# Patient Record
Sex: Female | Born: 1955
Health system: Southern US, Community
[De-identification: ages and names within clinical notes are randomized; demographics above are authoritative.]

## PROBLEM LIST (undated history)

## (undated) DIAGNOSIS — Z923 Personal history of irradiation: Secondary | ICD-10-CM

## (undated) DIAGNOSIS — Z9221 Personal history of antineoplastic chemotherapy: Secondary | ICD-10-CM

## (undated) DIAGNOSIS — J302 Other seasonal allergic rhinitis: Secondary | ICD-10-CM

## (undated) DIAGNOSIS — R011 Cardiac murmur, unspecified: Secondary | ICD-10-CM

## (undated) DIAGNOSIS — I10 Essential (primary) hypertension: Secondary | ICD-10-CM

## (undated) DIAGNOSIS — M1990 Unspecified osteoarthritis, unspecified site: Secondary | ICD-10-CM

## (undated) DIAGNOSIS — Z87442 Personal history of urinary calculi: Secondary | ICD-10-CM

## (undated) DIAGNOSIS — T7840XA Allergy, unspecified, initial encounter: Secondary | ICD-10-CM

## (undated) DIAGNOSIS — C50911 Malignant neoplasm of unspecified site of right female breast: Secondary | ICD-10-CM

## (undated) DIAGNOSIS — I340 Nonrheumatic mitral (valve) insufficiency: Secondary | ICD-10-CM

## (undated) DIAGNOSIS — Z87898 Personal history of other specified conditions: Secondary | ICD-10-CM

## (undated) DIAGNOSIS — I341 Nonrheumatic mitral (valve) prolapse: Secondary | ICD-10-CM

## (undated) DIAGNOSIS — C50411 Malignant neoplasm of upper-outer quadrant of right female breast: Secondary | ICD-10-CM

## (undated) DIAGNOSIS — R569 Unspecified convulsions: Secondary | ICD-10-CM

## (undated) HISTORY — DX: Nonrheumatic mitral (valve) prolapse: I34.1

## (undated) HISTORY — PX: MASTECTOMY: SHX3

## (undated) HISTORY — PX: TONSILLECTOMY: SHX5217

## (undated) HISTORY — DX: Essential (primary) hypertension: I10

## (undated) HISTORY — DX: Allergy, unspecified, initial encounter: T78.40XA

## (undated) HISTORY — DX: Unspecified convulsions: R56.9

## (undated) HISTORY — PX: PLANTAR'S WART EXCISION: SHX2240

## (undated) HISTORY — DX: Malignant neoplasm of upper-outer quadrant of right female breast: C50.411

---
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
## 1988-10-10 DIAGNOSIS — I341 Nonrheumatic mitral (valve) prolapse: Secondary | ICD-10-CM

## 1988-10-10 HISTORY — DX: Nonrheumatic mitral (valve) prolapse: I34.1

## 1994-10-10 HISTORY — PX: TUBAL LIGATION: SHX77

## 1996-10-10 HISTORY — PX: ABDOMINAL HYSTERECTOMY: SHX81

## 1998-02-13 ENCOUNTER — Ambulatory Visit (HOSPITAL_COMMUNITY): Admission: RE | Admit: 1998-02-13 | Discharge: 1998-02-13 | Payer: Self-pay | Admitting: Obstetrics and Gynecology

## 1998-02-17 ENCOUNTER — Emergency Department (HOSPITAL_COMMUNITY): Admission: EM | Admit: 1998-02-17 | Discharge: 1998-02-17 | Payer: Self-pay | Admitting: Emergency Medicine

## 1999-02-10 ENCOUNTER — Inpatient Hospital Stay (HOSPITAL_COMMUNITY): Admission: RE | Admit: 1999-02-10 | Discharge: 1999-02-13 | Payer: Self-pay | Admitting: Obstetrics and Gynecology

## 2002-01-10 ENCOUNTER — Encounter: Admission: RE | Admit: 2002-01-10 | Discharge: 2002-01-10 | Payer: Self-pay | Admitting: *Deleted

## 2002-01-10 ENCOUNTER — Encounter: Payer: Self-pay | Admitting: *Deleted

## 2002-03-02 ENCOUNTER — Emergency Department (HOSPITAL_COMMUNITY): Admission: EM | Admit: 2002-03-02 | Discharge: 2002-03-02 | Payer: Self-pay | Admitting: Emergency Medicine

## 2002-03-03 ENCOUNTER — Encounter: Payer: Self-pay | Admitting: Emergency Medicine

## 2002-10-02 ENCOUNTER — Other Ambulatory Visit: Admission: RE | Admit: 2002-10-02 | Discharge: 2002-10-02 | Payer: Self-pay | Admitting: Obstetrics and Gynecology

## 2003-01-19 ENCOUNTER — Emergency Department (HOSPITAL_COMMUNITY): Admission: EM | Admit: 2003-01-19 | Discharge: 2003-01-19 | Payer: Self-pay | Admitting: Emergency Medicine

## 2004-12-16 ENCOUNTER — Other Ambulatory Visit: Admission: RE | Admit: 2004-12-16 | Discharge: 2004-12-16 | Payer: Self-pay | Admitting: Obstetrics and Gynecology

## 2007-12-21 ENCOUNTER — Encounter: Admission: RE | Admit: 2007-12-21 | Discharge: 2007-12-21 | Payer: Self-pay | Admitting: Orthopedic Surgery

## 2008-02-26 ENCOUNTER — Ambulatory Visit: Payer: Self-pay | Admitting: Surgery

## 2008-02-26 ENCOUNTER — Encounter (INDEPENDENT_AMBULATORY_CARE_PROVIDER_SITE_OTHER): Payer: Self-pay | Admitting: Orthopedic Surgery

## 2008-02-26 ENCOUNTER — Ambulatory Visit (HOSPITAL_COMMUNITY): Admission: RE | Admit: 2008-02-26 | Discharge: 2008-02-26 | Payer: Self-pay | Admitting: Orthopedic Surgery

## 2008-03-26 ENCOUNTER — Ambulatory Visit (HOSPITAL_COMMUNITY): Admission: RE | Admit: 2008-03-26 | Discharge: 2008-03-26 | Payer: Self-pay | Admitting: Orthopedic Surgery

## 2008-03-26 HISTORY — PX: KNEE ARTHROSCOPY: SHX127

## 2010-02-21 ENCOUNTER — Emergency Department (HOSPITAL_COMMUNITY): Admission: RE | Admit: 2010-02-21 | Discharge: 2010-02-21 | Payer: Self-pay | Admitting: Family Medicine

## 2010-04-09 LAB — HM PAP SMEAR: HM Pap smear: NORMAL

## 2010-04-09 LAB — HM MAMMOGRAPHY: HM Mammogram: NORMAL

## 2010-12-27 LAB — URINE MICROSCOPIC-ADD ON

## 2010-12-27 LAB — URINALYSIS, ROUTINE W REFLEX MICROSCOPIC
Bilirubin Urine: NEGATIVE
Glucose, UA: NEGATIVE mg/dL
Ketones, ur: NEGATIVE mg/dL
Leukocytes, UA: NEGATIVE
Nitrite: NEGATIVE
Protein, ur: NEGATIVE mg/dL
Specific Gravity, Urine: 1.017 (ref 1.005–1.030)
Urobilinogen, UA: 0.2 mg/dL (ref 0.0–1.0)
pH: 6.5 (ref 5.0–8.0)

## 2011-02-22 NOTE — Op Note (Signed)
NAME:  Katie Jacobson, Katie Jacobson              ACCOUNT NO.:  [id number]   MEDICAL RECORD NO.:  [id number]          PATIENT TYPE:  AMB   LOCATION:  SDS                          FACILITY:  [facility]   PHYSICIAN:  Myrtie Neither, MD      DATE OF BIRTH:  January 09, 1956   DATE OF PROCEDURE:  DATE OF DISCHARGE:  03/26/2008                               OPERATIVE REPORT   PREOP DIAGNOSES:  Lateral meniscal tear, internal derangement of the  left knee.   POSTOP DIAGNOSES:  Lateral meniscal tear, patellar plica, and chondral  defect involving the tibial plateau surface, and mild defect of the  lateral femoral condyle.   ANESTHESIA:  General.   PROCEDURES:  1. Arthroscopic synovectomy and excision of plica.  2. Lateral meniscectomy.  3. Chondroplasty of the lateral tibial plateau surface.   The patient was taken to the operating room.  After given adequate preop  medications, given general anesthesia, and intubated, left knee was  prepped with DuraPrep and draped in sterile manner.  Tourniquet used for  hemostasis.  A 1/2 inch puncture wound made along the anteromedial and  anterolateral joint line, medial suprapatellar pouch, and puncture wound  was made for inflow of water.  Inspection of the joint revealed a  macerated lateral meniscal tear, which had damaged the articular surface  of the tibial plateau surface and a small defect of the lateral femoral  condyle.  Large patellar plica was also present.  Medial compartment was  well-preserved.  ACL was intact.  There was arthritic chondromalacic  changes of the patella and patellofemoral joint.  With the synovial  shaver, synovectomy and excision of the patella plica was done.  Followed by use of basket forceps, the lateral meniscectomy, as well as  the meniscal shaver.  Chondroplasty of the tibial surface was done with  the meniscal shaver followed by use of a rasp.  Copious antibiotic  irrigation was done.  Wound closure was then done with 4-0 nylon  followed by 10 mL of 0.25% Marcaine plain injected into the joint.  Compressive dressing was applied.  The patient tolerated procedure quite  well and went to recovery room in stable and satisfactory condition.  The patient being discharged home, partial weightbearing with use of  crutches, ice packs, elevation, Percocet 1-2 q.4 p.r.n. for pain and to  return to the office in 1 week.  The patient has been discharged in  stable and satisfactory condition.      Myrtie Neither, MD  Electronically Signed     AC/MEDQ  D:  03/26/2008  T:  03/26/2008  Job:  [id number]

## 2011-04-01 ENCOUNTER — Encounter: Payer: Self-pay | Admitting: Family

## 2011-04-01 ENCOUNTER — Ambulatory Visit (INDEPENDENT_AMBULATORY_CARE_PROVIDER_SITE_OTHER): Payer: BC Managed Care – PPO | Admitting: Family

## 2011-04-01 DIAGNOSIS — D219 Benign neoplasm of connective and other soft tissue, unspecified: Secondary | ICD-10-CM | POA: Insufficient documentation

## 2011-04-01 DIAGNOSIS — J45909 Unspecified asthma, uncomplicated: Secondary | ICD-10-CM

## 2011-04-01 DIAGNOSIS — I341 Nonrheumatic mitral (valve) prolapse: Secondary | ICD-10-CM | POA: Insufficient documentation

## 2011-04-01 DIAGNOSIS — Z720 Tobacco use: Secondary | ICD-10-CM | POA: Insufficient documentation

## 2011-04-01 DIAGNOSIS — N951 Menopausal and female climacteric states: Secondary | ICD-10-CM

## 2011-04-01 DIAGNOSIS — I10 Essential (primary) hypertension: Secondary | ICD-10-CM

## 2011-04-01 DIAGNOSIS — I080 Rheumatic disorders of both mitral and aortic valves: Secondary | ICD-10-CM | POA: Insufficient documentation

## 2011-04-01 DIAGNOSIS — I059 Rheumatic mitral valve disease, unspecified: Secondary | ICD-10-CM

## 2011-04-01 DIAGNOSIS — R232 Flushing: Secondary | ICD-10-CM | POA: Insufficient documentation

## 2011-04-01 DIAGNOSIS — D259 Leiomyoma of uterus, unspecified: Secondary | ICD-10-CM

## 2011-04-01 DIAGNOSIS — Z8669 Personal history of other diseases of the nervous system and sense organs: Secondary | ICD-10-CM

## 2011-04-01 DIAGNOSIS — F17200 Nicotine dependence, unspecified, uncomplicated: Secondary | ICD-10-CM

## 2011-04-01 MED ORDER — FLUCONAZOLE 150 MG PO TABS: ORAL_TABLET | ORAL | Status: DC

## 2011-04-01 MED ORDER — ALBUTEROL SULFATE HFA 108 (90 BASE) MCG/ACT IN AERS: 2.0000 | INHALATION_SPRAY | Freq: Four times a day (QID) | RESPIRATORY_TRACT | Status: DC | PRN

## 2011-04-01 NOTE — Assessment & Plan Note (Signed)
Clinically stable, she is requesting refill on her albuterol MDI which is expired.

## 2011-04-01 NOTE — Assessment & Plan Note (Signed)
Discussed tobacco cessation with the patient x 10 minutes today. Recommended that she set a quit date.

## 2011-04-01 NOTE — Progress Notes (Signed)
  Subjective:    Patient ID: Katie Jacobson, female    DOB: Feb 03, 1956, 55 y.o.   MRN: [id number]  HPI  Katie Jacobson is a 55 year old female who presents today to establish care.  She was previously followed by Katie Jacobson at [facility] in [place].    She has chief complaint today of hot flashes- she reports that the flashes started started several years ago, but notes that in the last 1 month it is occuring almost every 15 minutes.  She reports that she had a partial hysterectomy.    GYN- Katie Jacobson.  Had a first cousin with breast cancer- died in her late 52's.    Tobacco-  She has tried nicorette,    Review of Systems  Constitutional: Negative for fever and unexpected weight change.  HENT: Negative for hearing loss.   Eyes: Negative for visual disturbance.  Respiratory: Negative for shortness of breath.   Cardiovascular: Negative for chest pain and leg swelling.  Gastrointestinal: Negative for nausea.  Genitourinary: Positive for vaginal discharge. Negative for dysuria and frequency.       Objective:   Physical Exam  Constitutional: She appears well-developed and well-nourished.       Morbidly obese AA female.  Neck: Neck supple.       Scar noted on the right side of her neck  Cardiovascular: Normal rate and regular rhythm.   Pulmonary/Chest: Effort normal and breath sounds normal.  Lymphadenopathy:    She has no cervical adenopathy.  Psychiatric: She has a normal mood and affect. Her behavior is normal. Judgment and thought content normal.          Assessment & Plan:

## 2011-04-01 NOTE — Assessment & Plan Note (Signed)
I recommended a trial of citalopram to help with hot flashes, she declines at this time. She has an apt with her GYN and I have advised her to discuss if she would be a good candidate for HRT with him.

## 2011-04-01 NOTE — Patient Instructions (Signed)
Work hard on quitting smoking. Follow up in 1 month for a complete physical- come fasting to this appointment. Welcome to [facility]!

## 2011-04-01 NOTE — Assessment & Plan Note (Signed)
BP is stable today, continue current meds.

## 2011-04-08 ENCOUNTER — Telehealth: Payer: Self-pay | Admitting: *Deleted

## 2011-04-08 NOTE — Telephone Encounter (Signed)
Records received from Dr Chestine Spore at [facility] and forwarded to Provider for signature.

## 2011-04-28 ENCOUNTER — Encounter: Payer: Self-pay | Admitting: Family

## 2011-05-06 ENCOUNTER — Ambulatory Visit: Payer: BC Managed Care – PPO | Admitting: Family

## 2011-07-06 ENCOUNTER — Encounter: Payer: Self-pay | Admitting: Family

## 2011-07-06 ENCOUNTER — Ambulatory Visit (INDEPENDENT_AMBULATORY_CARE_PROVIDER_SITE_OTHER): Payer: BC Managed Care – PPO | Admitting: Family

## 2011-07-06 DIAGNOSIS — Z0000 Encounter for general adult medical examination without abnormal findings: Secondary | ICD-10-CM

## 2011-07-06 DIAGNOSIS — Z23 Encounter for immunization: Secondary | ICD-10-CM

## 2011-07-06 LAB — CBC WITH DIFFERENTIAL/PLATELET
Basophils Absolute: 0 10*3/uL (ref 0.0–0.1)
Eosinophils Absolute: 0.1 10*3/uL (ref 0.0–0.7)
Eosinophils Relative: 2 % (ref 0–5)
HCT: 44.7 % (ref 36.0–46.0)
Lymphocytes Relative: 35 % (ref 12–46)
Lymphs Abs: 2.2 10*3/uL (ref 0.7–4.0)
MCH: 30.4 pg (ref 26.0–34.0)
MCV: 93.1 fL (ref 78.0–100.0)
Monocytes Absolute: 0.5 10*3/uL (ref 0.1–1.0)
Platelets: 227 10*3/uL (ref 150–400)
RDW: 15.9 % — ABNORMAL HIGH (ref 11.5–15.5)

## 2011-07-06 LAB — HEPATIC FUNCTION PANEL
Albumin: 4.1 g/dL (ref 3.5–5.2)
Alkaline Phosphatase: 70 U/L (ref 39–117)
Total Bilirubin: 0.3 mg/dL (ref 0.3–1.2)
Total Protein: 6.6 g/dL (ref 6.0–8.3)

## 2011-07-06 LAB — LIPID PANEL
HDL: 66 mg/dL (ref >39)
LDL Cholesterol: 125 mg/dL — ABNORMAL HIGH (ref 0–99)
Triglycerides: 65 mg/dL (ref <150)
VLDL: 13 mg/dL (ref 0–40)

## 2011-07-06 LAB — BASIC METABOLIC PANEL WITH GFR
CO2: 24 mEq/L (ref 19–32)
Calcium: 9.1 mg/dL (ref 8.4–10.5)
Chloride: 106 mEq/L (ref 96–112)
Creat: 0.87 mg/dL (ref 0.50–1.10)
Glucose, Bld: 77 mg/dL (ref 70–99)

## 2011-07-06 NOTE — Progress Notes (Signed)
Subjective:    Patient ID: Katie Jacobson, female    DOB: June 11, 1956, 55 y.o.   MRN: [id number]  HPI  Katie Jacobson is a 55 yr old female who presents today for a complete physical.  Last tetanus >10 yrs ago.  Not exercising.  Wants to start running. Diet is - fair eats out a lot.  Pap and mammogram are up to date- sees [facility].  Tobacco abuse- Still smoking.    Review of Systems  Constitutional: Negative for fever and unexpected weight change.  HENT: Positive for congestion.   Respiratory: Negative for cough.   Cardiovascular: Negative for chest pain.  Gastrointestinal: Negative for vomiting and diarrhea.  Genitourinary: Negative for urgency.  Musculoskeletal: Negative for back pain.  Skin: Negative for rash.  Neurological: Negative for dizziness.  Hematological: Negative for adenopathy.  Psychiatric/Behavioral:       Denies depression/anxiety   Past Medical History  Diagnosis Date  . Hypertension   . Osteoporosis   . HTN (hypertension) 04/01/2011  . History of partial seizures 04/01/2011  . Mitral valve prolapse     History   Social History  . Marital Status: Married    Spouse Name: N/A    Number of Children: N/A  . Years of Education: N/A   Occupational History  . Not on file.   Social History Main Topics  . Smoking status: Current Everyday Smoker -- 1.0 packs/day for 39 years    Types: Cigarettes, Cigars  . Smokeless tobacco: Never Used  . Alcohol Use: 4.2 oz/week    7 Cans of beer per week  . Drug Use: No  . Sexually Active: Not on file   Other Topics Concern  . 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 Appendectomy 1976  . Abdominal hysterectomy 1998    ovaries spared  . Tonsillectomy 1970s  . Knee arthroscopy 2006    left  knee  . Cystectomy     Pt reports removal of a  "stone" from the right side of her throat as a child  . Tubal ligation 1996    Family History  Problem Relation Age of Onset  . Heart disease Mother   . Stroke Mother   . Hypertension Mother   . Diabetes Mother   . Diabetes Sister   . Kidney disease Brother   . Mental retardation Brother   . Cancer Cousin     breast  . Diabetes Sister     Allergies  Allergen Reactions  . Sulfa Antibiotics     Unknown reaction    Current Outpatient Prescriptions on File Prior to Visit  Medication Sig Dispense Refill  . albuterol (PROVENTIL HFA;VENTOLIN HFA) 108 (90 BASE) MCG/ACT inhaler Inhale 2 puffs into the lungs every 6 (six) hours as needed for wheezing.  1 Inhaler  2  . enalapril (VASOTEC) 20 MG tablet Take 20 mg by mouth daily.        . metoprolol (TOPROL-XL) 50 MG 24 hr tablet Take 50 mg by mouth daily.          BP 118/88  Pulse 72  Temp(Src) 97.8 F (36.6 C) (Oral)  Resp 16  Ht 5\' 1"  (1.549 m)  Wt 227 lb 0.6 oz (102.985 kg)  BMI 42.90 kg/m2  LMP 10/10/1996  Objective:   Physical Exam  Constitutional: She is oriented to person, place, and time. She appears well-developed and well-nourished. No distress.  HENT:  Head: Normocephalic and atraumatic.  Right Ear: Tympanic membrane and ear canal normal.  Left Ear: Tympanic membrane and ear canal normal.  Mouth/Throat: Uvula is midline, oropharynx is clear and moist and mucous membranes are normal.  Eyes: Conjunctivae are normal. Pupils are equal, round, and reactive to light.  Neck: Normal range of motion. Neck supple. No thyromegaly present.  Cardiovascular: Normal rate and regular rhythm.   No murmur heard. Pulmonary/Chest: Effort normal and breath sounds normal. No respiratory distress. She has no wheezes. She has no rales. She exhibits no tenderness.  Abdominal: Soft. Bowel sounds are normal. She exhibits no distension and no mass. There is no tenderness. There is no rebound and no guarding.  Musculoskeletal: Normal  range of motion. She exhibits no edema.  Lymphadenopathy:    She has no cervical adenopathy.  Neurological: She is alert and oriented to person, place, and time. She displays normal reflexes. No cranial nerve deficit. She exhibits normal muscle tone. Coordination normal.  Skin: Skin is warm and dry. No rash noted. No erythema. No pallor.  Psychiatric: She has a normal mood and affect. Her behavior is normal. Judgment and thought content normal.          Assessment & Plan:

## 2011-07-06 NOTE — Patient Instructions (Addendum)
You will be contacted about your referral for colonoscopy. Please complete lab work on the first floor.  Follow up in 6 months.

## 2011-07-07 ENCOUNTER — Encounter: Payer: Self-pay | Admitting: Family

## 2011-07-07 ENCOUNTER — Telehealth: Payer: Self-pay | Admitting: *Deleted

## 2011-07-07 DIAGNOSIS — Z0000 Encounter for general adult medical examination without abnormal findings: Secondary | ICD-10-CM | POA: Insufficient documentation

## 2011-07-07 DIAGNOSIS — E785 Hyperlipidemia, unspecified: Secondary | ICD-10-CM | POA: Insufficient documentation

## 2011-07-07 LAB — COMPREHENSIVE METABOLIC PANEL
ALT: 16
Alkaline Phosphatase: 80
BUN: 7
CO2: 27
Chloride: 107
GFR calc non Af Amer: 55 — ABNORMAL LOW
Glucose, Bld: 90
Potassium: 4.9
Sodium: 141
Total Bilirubin: 0.7

## 2011-07-07 LAB — CBC
HCT: 40.6
Hemoglobin: 13.7
RBC: 4.46

## 2011-07-07 LAB — URINALYSIS, ROUTINE W REFLEX MICROSCOPIC
Glucose, UA: NEGATIVE
Ketones, ur: NEGATIVE
Nitrite: NEGATIVE
Protein, ur: NEGATIVE
Urobilinogen, UA: 0.2

## 2011-07-07 NOTE — Telephone Encounter (Signed)
Received call from pt stating her employer gave her a form to be completed for her health screening yesterday. Attempted to reach pt and left message that she can bring form to the office or fax it to [place] and left fax # on voicemail.

## 2011-07-07 NOTE — Assessment & Plan Note (Signed)
Patient was counseled on diet, exercise and weight loss.  Flu shot/tetanus given today. Will refer for colo. Pt was counseled on smoking cessation.

## 2011-07-07 NOTE — Telephone Encounter (Signed)
Pt returned my call stating she will fax the form now. States they are requesting body measurements and advises [place] to complete and submit the info that we have as she is aware that we did not obtain this at last OV. Form must be faxed to [employer] by 07/25/11. Pt also requests that we mail her a copy of completed form and lab results once they are available.

## 2011-07-08 ENCOUNTER — Encounter: Payer: Self-pay | Admitting: Gastroenterology

## 2011-07-08 NOTE — Telephone Encounter (Signed)
Form has not been received. Notified pt and she states she has tried numerous times without success to complete the transmission. Pt states she will drop form off by the office.

## 2011-07-11 NOTE — Telephone Encounter (Signed)
Form received. Form is requiring pt signature to authorize release of medical info to insurance company. Left message at pt's work number [phone]) to call me with her fax number so she can sign form prior to completion and release of info.

## 2011-07-13 NOTE — Telephone Encounter (Signed)
Pt refaxed signed copy of form. Form completed, signed and faxed to [facility] @ [phone] along with lab results. Copy of form mailed to pt.

## 2011-07-22 ENCOUNTER — Ambulatory Visit (AMBULATORY_SURGERY_CENTER): Payer: Managed Care, Other (non HMO) | Admitting: *Deleted

## 2011-07-22 VITALS — Ht 61.0 in | Wt 227.4 lb

## 2011-07-22 DIAGNOSIS — Z1211 Encounter for screening for malignant neoplasm of colon: Secondary | ICD-10-CM

## 2011-07-22 MED ORDER — PEG-KCL-NACL-NASULF-NA ASC-C 100 G PO SOLR: ORAL | Status: DC

## 2011-08-05 ENCOUNTER — Ambulatory Visit (AMBULATORY_SURGERY_CENTER): Payer: Managed Care, Other (non HMO) | Admitting: Gastroenterology

## 2011-08-05 ENCOUNTER — Encounter: Payer: Self-pay | Admitting: Gastroenterology

## 2011-08-05 VITALS — BP 140/89 | HR 66 | Temp 96.7°F | Resp 22 | Ht 61.0 in | Wt 227.0 lb

## 2011-08-05 DIAGNOSIS — D126 Benign neoplasm of colon, unspecified: Secondary | ICD-10-CM

## 2011-08-05 DIAGNOSIS — K5730 Diverticulosis of large intestine without perforation or abscess without bleeding: Secondary | ICD-10-CM

## 2011-08-05 DIAGNOSIS — Z1211 Encounter for screening for malignant neoplasm of colon: Secondary | ICD-10-CM

## 2011-08-05 MED ORDER — SODIUM CHLORIDE 0.9 % IV SOLN: 500.0000 mL | INTRAVENOUS | Status: DC

## 2011-08-05 NOTE — Patient Instructions (Addendum)
Colon Polyps A polyp is extra tissue that grows inside your body. Colon polyps grow in the large intestine. The large intestine, also called the colon, is part of your digestive system. It is a long, hollow tube at the end of your digestive tract where your body makes and stores stool. Most polyps are not dangerous. They are benign. This means they are not cancerous. But over time, some types of polyps can turn into cancer. Polyps that are smaller than a pea are usually not harmful. But larger polyps could someday become or may already be cancerous. To be safe, doctors remove all polyps and test them.  WHO GETS POLYPS? Anyone can get polyps, but certain people are more likely than others. You may have a greater chance of getting polyps if:  You are over 50.   You have had polyps before.   Someone in your family has had polyps.   Someone in your family has had cancer of the large intestine.   Find out if someone in your family has had polyps. You may also be more likely to get polyps if you:   Eat a lot of fatty foods.   Smoke.   Drink alcohol.   Do not exercise.   Eat too much.  SYMPTOMS  Most small polyps do not cause symptoms. People often do not know they have one until their caregiver finds it during a regular checkup or while testing them for something else. Some people do have symptoms like these:  Bleeding from the anus. You might notice blood on your underwear or on toilet paper after you have had a bowel movement.   Constipation or diarrhea that lasts more than a week.   Blood in the stool. Blood can make stool look black or it can show up as red streaks in the stool.  If you have any of these symptoms, see your caregiver. HOW DOES THE DOCTOR TEST FOR POLYPS? The doctor can use four tests to check for polyps:  Digital rectal exam. The caregiver wears gloves and checks your rectum (the last part of the large intestine) to see if it feels normal. This test would find  polyps only in the rectum. Your caregiver may need to do one of the other tests listed below to find polyps higher up in the intestine.   Barium enema. The caregiver puts a liquid called barium into your rectum before taking x-rays of your large intestine. Barium makes your intestine look white in the pictures. Polyps are dark, so they are easy to see.   Sigmoidoscopy. With this test, the caregiver can see inside your large intestine. A thin flexible tube is placed into your rectum. The device is called a sigmoidoscope, which has a light and a tiny video camera in it. The caregiver uses the sigmoidoscope to look at the last third of your large intestine.   Colonoscopy. This test is like sigmoidoscopy, but the caregiver looks at all of the large intestine. It usually requires sedation. This is the most common method for finding and removing polyps.  TREATMENT   The caregiver will remove the polyp during sigmoidoscopy or colonoscopy. The polyp is then tested for cancer.   If you have had polyps, your caregiver may want you to get tested regularly in the future.  PREVENTION  There is not one sure way to prevent polyps. You might be able to lower your risk of getting them if you:  Eat more fruits and vegetables and less fatty  food.   Do not smoke.   Avoid alcohol.   Exercise every day.   Lose weight if you are overweight.   Eating more calcium and folate can also lower your risk of getting polyps. Some foods that are rich in calcium are milk, cheese, and broccoli. Some foods that are rich in folate are chickpeas, kidney beans, and spinach.   Aspirin might help prevent polyps. Studies are under way.  Document Released: 06/22/2004 Document Revised: 06/08/2011 Document Reviewed: 11/28/2007 [facility] Patient Information 2012 [place], [place]. Handouts on polyps and diverticulosis given. Resume previous medications.

## 2011-08-05 NOTE — Progress Notes (Signed)
The pt tolerated the colonoscopy very well. maw 

## 2011-08-08 ENCOUNTER — Telehealth: Payer: Self-pay

## 2011-08-08 NOTE — Telephone Encounter (Signed)
I called the home and cell number which neither had a ID.  No message was left. maw

## 2011-08-12 ENCOUNTER — Telehealth: Payer: Self-pay | Admitting: Gastroenterology

## 2011-08-12 NOTE — Telephone Encounter (Signed)
Left message for pt to call back.  Pt had questions about her pathology report and results letter. Discussed letter with pt and all questions were answered.

## 2011-12-30 ENCOUNTER — Ambulatory Visit: Payer: BC Managed Care – PPO | Admitting: Family

## 2011-12-30 DIAGNOSIS — Z0289 Encounter for other administrative examinations: Secondary | ICD-10-CM

## 2012-07-06 ENCOUNTER — Encounter: Payer: Self-pay | Admitting: Family

## 2012-07-06 ENCOUNTER — Ambulatory Visit (INDEPENDENT_AMBULATORY_CARE_PROVIDER_SITE_OTHER): Payer: BC Managed Care – PPO | Admitting: Family

## 2012-07-06 VITALS — BP 140/86 | HR 71 | Temp 98.6°F | Resp 16 | Wt 231.0 lb

## 2012-07-06 DIAGNOSIS — I10 Essential (primary) hypertension: Secondary | ICD-10-CM

## 2012-07-06 DIAGNOSIS — Z0000 Encounter for general adult medical examination without abnormal findings: Secondary | ICD-10-CM

## 2012-07-06 DIAGNOSIS — Z720 Tobacco use: Secondary | ICD-10-CM

## 2012-07-06 DIAGNOSIS — Z23 Encounter for immunization: Secondary | ICD-10-CM

## 2012-07-06 DIAGNOSIS — F17200 Nicotine dependence, unspecified, uncomplicated: Secondary | ICD-10-CM

## 2012-07-06 LAB — BASIC METABOLIC PANEL WITH GFR
CO2: 28 mEq/L (ref 19–32)
Calcium: 9.3 mg/dL (ref 8.4–10.5)
Creat: 1.03 mg/dL (ref 0.50–1.10)
GFR, Est African American: 72 mL/min
Sodium: 143 mEq/L (ref 135–145)

## 2012-07-06 LAB — HEPATIC FUNCTION PANEL
Albumin: 4.1 g/dL (ref 3.5–5.2)
Total Bilirubin: 0.4 mg/dL (ref 0.3–1.2)
Total Protein: 6.8 g/dL (ref 6.0–8.3)

## 2012-07-06 LAB — CBC WITH DIFFERENTIAL/PLATELET
Basophils Absolute: 0 10*3/uL (ref 0.0–0.1)
Eosinophils Absolute: 0.2 10*3/uL (ref 0.0–0.7)
Eosinophils Relative: 3 % (ref 0–5)
Lymphocytes Relative: 39 % (ref 12–46)
MCH: 30.7 pg (ref 26.0–34.0)
MCV: 90.7 fL (ref 78.0–100.0)
Neutrophils Relative %: 49 % (ref 43–77)
Platelets: 220 10*3/uL (ref 150–400)
RDW: 15.3 % (ref 11.5–15.5)
WBC: 6 10*3/uL (ref 4.0–10.5)

## 2012-07-06 LAB — LIPID PANEL
HDL: 66 mg/dL (ref >39)
Triglycerides: 58 mg/dL (ref <150)

## 2012-07-06 MED ORDER — ALBUTEROL SULFATE HFA 108 (90 BASE) MCG/ACT IN AERS: 2.0000 | INHALATION_SPRAY | Freq: Four times a day (QID) | RESPIRATORY_TRACT | Status: DC | PRN

## 2012-07-06 MED ORDER — VARENICLINE TARTRATE 0.5 MG X 11 & 1 MG X 42 PO MISC: ORAL | Status: DC

## 2012-07-06 NOTE — Progress Notes (Signed)
Subjective:    Patient ID: Katie Jacobson, female    DOB: Nov 08, 1955, 56 y.o.   MRN: [id number]  HPI  Patient presents today for complete physical.  Immunizations: up to date on tetanus, would like flu shot. Diet: Up about 4 pounds since last visit. Not healthy. Eating late at night.  Exercise: Not exercising.  Plans to start Colonoscopy: had colo last year + polyps.  Dr. Arlyce Dice would like 5 year follow up colo.  Dexa: 04/01/11 Pap Smear: follows with GYN (dr. Tenny Craw) Last pap 07/06/11. Mammogram: 07/06/11 Tobacco abuse- Trying to cut back.  Currently smoking just less than a pack.     Review of Systems  Constitutional: Negative for unexpected weight change.  HENT: Negative for hearing loss.   Eyes: Negative for visual disturbance.  Respiratory: Negative for chest tightness and shortness of breath.   Cardiovascular: Negative for chest pain.  Gastrointestinal: Negative for nausea, vomiting, diarrhea and constipation.  Genitourinary: Negative for dysuria and frequency.  Musculoskeletal: Negative for myalgias and arthralgias.  Skin: Negative for rash.  Neurological: Negative for headaches.  Hematological: Does not bruise/bleed easily.  Psychiatric/Behavioral:       Denies depression/anxiety   Past Medical History  Diagnosis Date  . Hypertension   . Osteoporosis   . HTN (hypertension) 04/01/2011  . History of partial seizures 04/01/2011  . Mitral valve prolapse     History   Social History  . Marital Status: Married    Spouse Name: N/A    Number of Children: N/A  . Years of Education: N/A   Occupational History  . Not on file.   Social History Main Topics  . Smoking status: Current Every Day Smoker -- 1.0 packs/day for 39 years    Types: Cigarettes  . Smokeless tobacco: Never Used  . Alcohol Use: 4.2 oz/week    7 Cans of beer per week  . Drug Use: No  . Sexually Active: Not on file   Other Topics Concern  . 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 Appendectomy 1976  . Abdominal hysterectomy 1998    ovaries spared  . Tonsillectomy 1970s  . Knee arthroscopy 2006    left  knee  . Cystectomy     Pt reports removal of a "stone" from the right side of her throat as a child  . Tubal ligation 1996  . Foot sx     X2    Family History  Problem Relation Age of Onset  . Heart disease Mother   . Stroke Mother   . Hypertension Mother   . Diabetes Mother   . Diabetes Sister   . Kidney disease Brother   . Mental retardation Brother   . Cancer Cousin     breast  . Diabetes Sister   . Colon cancer Neg Hx   . Esophageal cancer Neg Hx   . Stomach cancer Neg Hx     Allergies  Allergen Reactions  . Sulfa Antibiotics Other (See Comments)    Unknown reaction    Current Outpatient Prescriptions on File Prior to Visit  Medication Sig Dispense Refill  . enalapril (VASOTEC) 20 MG tablet Take 20 mg by mouth daily.        . metoprolol (TOPROL-XL) 50 MG 24 hr tablet Take 50 mg by mouth daily.        Marland Kitchen  DISCONTD: albuterol (PROVENTIL HFA;VENTOLIN HFA) 108 (90 BASE) MCG/ACT inhaler Inhale 2 puffs into the lungs every 6 (six) hours as needed for wheezing.  1 Inhaler  2    BP 140/86  Pulse 71  Temp 98.6 F (37 C) (Oral)  Resp 16  Wt 231 lb (104.781 kg)  SpO2 99%  LMP 10/10/1996       Objective:   Physical Exam  Physical Exam  Constitutional: She is oriented to person, place, and time. She appears well-developed and well-nourished. No distress.  HENT:  Head: Normocephalic and atraumatic.  Right Ear: Tympanic membrane and ear canal normal.  Left Ear: Tympanic membrane and ear canal normal.  Mouth/Throat: Oropharynx is clear and moist.  Eyes: Pupils are equal, round, and reactive to light. No scleral icterus.  Neck: Normal range of motion. No thyromegaly present.    Cardiovascular: Normal rate and regular rhythm.   No murmur heard. Pulmonary/Chest: Effort normal and breath sounds normal. No respiratory distress. He has no wheezes. She has no rales. She exhibits no tenderness.  Abdominal: Soft. Bowel sounds are normal. He exhibits no distension and no mass. There is no tenderness. There is no rebound and no guarding.  Musculoskeletal: She exhibits no edema.  Lymphadenopathy:    She has no cervical adenopathy.  Neurological: She is alert and oriented to person, place, and time. She has normal reflexes. She exhibits normal muscle tone. Coordination normal.  Skin: Skin is warm and dry.  Psychiatric: She has a normal mood and affect. Her behavior is normal. Judgment and thought content normal.  Breast/pelvic- deferred to GYN.        Assessment & Plan:        Assessment & Plan:

## 2012-07-06 NOTE — Assessment & Plan Note (Signed)
Fair BP control. This is managed by cardiology Dr. Julieanne Manson.

## 2012-07-06 NOTE — Assessment & Plan Note (Addendum)
Obtain fasting labs. Flu shot today. Pneumovax today.  Pap- she plans to follow up with GYN.  Counseled pt on diet, exercise/weight loss.

## 2012-07-06 NOTE — Assessment & Plan Note (Signed)
Pt counseled on tobacco cessation x 5 minutes.  She wishes to try chantix.  We discussed common side effects including rare side effect of suicide ideation.  She is to call us for refill before she finishes starter pack.

## 2012-07-06 NOTE — Patient Instructions (Addendum)
Please complete your blood work prior to leaving.  Please follow up in 3 months. Good luck quitting smoking.

## 2012-07-07 LAB — URINALYSIS, ROUTINE W REFLEX MICROSCOPIC
Bilirubin Urine: NEGATIVE
Glucose, UA: NEGATIVE mg/dL
Ketones, ur: NEGATIVE mg/dL
Leukocytes, UA: NEGATIVE
pH: 5.5 (ref 5.0–8.0)

## 2012-07-07 LAB — URINALYSIS, MICROSCOPIC ONLY
Bacteria, UA: NONE SEEN
Casts: NONE SEEN

## 2012-07-09 ENCOUNTER — Encounter: Payer: Self-pay | Admitting: Family

## 2012-08-04 ENCOUNTER — Emergency Department (HOSPITAL_BASED_OUTPATIENT_CLINIC_OR_DEPARTMENT_OTHER)
Admission: EM | Admit: 2012-08-04 | Discharge: 2012-08-04 | Disposition: A | Discharge status: Home / Self Care | Payer: BC Managed Care – PPO | Attending: Emergency Medicine | Admitting: Emergency Medicine

## 2012-08-04 ENCOUNTER — Encounter (HOSPITAL_BASED_OUTPATIENT_CLINIC_OR_DEPARTMENT_OTHER): Payer: Self-pay | Admitting: *Deleted

## 2012-08-04 ENCOUNTER — Emergency Department (HOSPITAL_BASED_OUTPATIENT_CLINIC_OR_DEPARTMENT_OTHER): Payer: BC Managed Care – PPO

## 2012-08-04 DIAGNOSIS — N200 Calculus of kidney: Secondary | ICD-10-CM

## 2012-08-04 DIAGNOSIS — R112 Nausea with vomiting, unspecified: Secondary | ICD-10-CM | POA: Insufficient documentation

## 2012-08-04 DIAGNOSIS — I10 Essential (primary) hypertension: Secondary | ICD-10-CM | POA: Insufficient documentation

## 2012-08-04 DIAGNOSIS — Z8669 Personal history of other diseases of the nervous system and sense organs: Secondary | ICD-10-CM | POA: Insufficient documentation

## 2012-08-04 DIAGNOSIS — M810 Age-related osteoporosis without current pathological fracture: Secondary | ICD-10-CM | POA: Insufficient documentation

## 2012-08-04 DIAGNOSIS — Z79899 Other long term (current) drug therapy: Secondary | ICD-10-CM | POA: Insufficient documentation

## 2012-08-04 DIAGNOSIS — I059 Rheumatic mitral valve disease, unspecified: Secondary | ICD-10-CM | POA: Insufficient documentation

## 2012-08-04 DIAGNOSIS — F17200 Nicotine dependence, unspecified, uncomplicated: Secondary | ICD-10-CM | POA: Insufficient documentation

## 2012-08-04 LAB — CBC WITH DIFFERENTIAL/PLATELET
Basophils Absolute: 0 10*3/uL (ref 0.0–0.1)
Eosinophils Relative: 3 % (ref 0–5)
Lymphocytes Relative: 39 % (ref 12–46)
MCV: 90.1 fL (ref 78.0–100.0)
Neutro Abs: 3 10*3/uL (ref 1.7–7.7)
Neutrophils Relative %: 47 % (ref 43–77)
Platelets: 175 10*3/uL (ref 150–400)
RDW: 15.3 % (ref 11.5–15.5)
WBC: 6.4 10*3/uL (ref 4.0–10.5)

## 2012-08-04 LAB — COMPREHENSIVE METABOLIC PANEL
ALT: 10 U/L (ref 0–35)
AST: 16 U/L (ref 0–37)
CO2: 21 mEq/L (ref 19–32)
Calcium: 9.4 mg/dL (ref 8.4–10.5)
GFR calc non Af Amer: 63 mL/min — ABNORMAL LOW (ref >90)
Potassium: 4.1 mEq/L (ref 3.5–5.1)
Sodium: 140 mEq/L (ref 135–145)
Total Protein: 7.3 g/dL (ref 6.0–8.3)

## 2012-08-04 LAB — URINALYSIS, ROUTINE W REFLEX MICROSCOPIC
Glucose, UA: NEGATIVE mg/dL
Ketones, ur: NEGATIVE mg/dL
Protein, ur: NEGATIVE mg/dL

## 2012-08-04 LAB — URINE MICROSCOPIC-ADD ON

## 2012-08-04 MED ORDER — HYDROMORPHONE HCL PF 1 MG/ML IJ SOLN
1.0000 mg | Freq: Once | INTRAMUSCULAR | Status: AC
  Administered 2012-08-04: 1 mg via INTRAVENOUS
  Filled 2012-08-04: qty 1

## 2012-08-04 MED ORDER — SODIUM CHLORIDE 0.9 % IV BOLUS (SEPSIS)
500.0000 mL | Freq: Once | INTRAVENOUS | Status: AC
  Administered 2012-08-04: 500 mL via INTRAVENOUS

## 2012-08-04 MED ORDER — HYDROMORPHONE HCL PF 1 MG/ML IJ SOLN
INTRAMUSCULAR | Status: AC
  Filled 2012-08-04: qty 1

## 2012-08-04 MED ORDER — ONDANSETRON HCL 4 MG PO TABS: 4.0000 mg | ORAL_TABLET | Freq: Four times a day (QID) | ORAL | Status: DC

## 2012-08-04 MED ORDER — MORPHINE SULFATE 4 MG/ML IJ SOLN
4.0000 mg | Freq: Once | INTRAMUSCULAR | Status: AC
Start: 2012-08-04 — End: 2012-08-04
  Administered 2012-08-04: 4 mg via INTRAVENOUS
  Filled 2012-08-04: qty 1

## 2012-08-04 MED ORDER — ONDANSETRON HCL 4 MG/2ML IJ SOLN
4.0000 mg | Freq: Once | INTRAMUSCULAR | Status: AC
  Administered 2012-08-04: 4 mg via INTRAVENOUS
  Filled 2012-08-04: qty 2

## 2012-08-04 MED ORDER — HYDROMORPHONE HCL PF 1 MG/ML IJ SOLN
1.0000 mg | Freq: Once | INTRAMUSCULAR | Status: AC
  Administered 2012-08-04: 1 mg via INTRAVENOUS

## 2012-08-04 MED ORDER — OXYCODONE-ACETAMINOPHEN 5-325 MG PO TABS: 2.0000 | ORAL_TABLET | ORAL | Status: DC | PRN

## 2012-08-04 MED ORDER — KETOROLAC TROMETHAMINE 30 MG/ML IJ SOLN
30.0000 mg | Freq: Once | INTRAMUSCULAR | Status: AC
  Administered 2012-08-04: 30 mg via INTRAVENOUS
  Filled 2012-08-04: qty 1

## 2012-08-04 NOTE — ED Notes (Signed)
Patient transported to CT 

## 2012-08-04 NOTE — ED Provider Notes (Signed)
History     CSN: [id number]  Arrival date & time 08/04/12  6295   First MD Initiated Contact with Patient 08/04/12 [phone]      Chief Complaint  Patient presents with  . Flank Pain    (Consider location/radiation/quality/duration/timing/severity/associated sxs/prior treatment) HPI Comments: Patient presents with sudden onset of pain in her right flank area that started earlier this morning. She states it's been constant worsening since then. She's had some nausea and vomiting associated with it. She denies any fevers or chills. She does feel like it's hard to pee. Denies any hematuria. She's not taking anything at home for the pain. She states she had similar episode once in the past and was told it was a possible kidney stone. It did not appear to be related to food. She's had no prior issues with her gallbladder.  Patient is a 56 y.o. female presenting with flank pain.  Flank Pain Associated symptoms include abdominal pain. Pertinent negatives include no chest pain, no headaches and no shortness of breath.    Past Medical History  Diagnosis Date  . Hypertension   . Osteoporosis   . HTN (hypertension) 04/01/2011  . History of partial seizures 04/01/2011  . Mitral valve prolapse     Past Surgical History  Procedure Date  . Appendectomy 1976  . Abdominal hysterectomy 1998    ovaries spared  . Tonsillectomy 1970s  . Knee arthroscopy 2006    left  knee  . Cystectomy     Pt reports removal of a "stone" from the right side of her throat as a child  . Tubal ligation 1996  . Foot sx     X2    Family History  Problem Relation Age of Onset  . Heart disease Mother   . Stroke Mother   . Hypertension Mother   . Diabetes Mother   . Diabetes Sister   . Kidney disease Brother   . Mental retardation Brother   . Cancer Cousin     breast  . Diabetes Sister   . Colon cancer Neg Hx   . Esophageal cancer Neg Hx   . Stomach cancer Neg Hx     History  Substance Use Topics  .  Smoking status: Current Every Day Smoker -- 1.0 packs/day for 39 years    Types: Cigarettes  . Smokeless tobacco: Never Used  . Alcohol Use: 4.2 oz/week    7 Cans of beer per week    OB History    Grav Para Term Preterm Abortions TAB SAB Ect Mult Living   2 2 2   0           Review of Systems  Constitutional: Negative for fever, chills, diaphoresis and fatigue.  HENT: Negative for congestion, rhinorrhea and sneezing.   Eyes: Negative.   Respiratory: Negative for cough, chest tightness and shortness of breath.   Cardiovascular: Negative for chest pain and leg swelling.  Gastrointestinal: Positive for nausea, vomiting and abdominal pain. Negative for diarrhea and blood in stool.  Genitourinary: Positive for dysuria, flank pain and difficulty urinating. Negative for frequency and hematuria.  Musculoskeletal: Negative for back pain and arthralgias.  Skin: Negative for rash.  Neurological: Negative for dizziness, speech difficulty, weakness, numbness and headaches.    Allergies  Sulfa antibiotics  Home Medications   Current Outpatient Rx  Name Route Sig Dispense Refill  . ALBUTEROL SULFATE HFA 108 (90 BASE) MCG/ACT IN AERS Inhalation Inhale 2 puffs into the lungs every 6 (six) hours as  needed for wheezing. 1 Inhaler 2  . ENALAPRIL MALEATE 20 MG PO TABS Oral Take 20 mg by mouth daily.      Marland Kitchen METOPROLOL SUCCINATE ER 50 MG PO TB24 Oral Take 50 mg by mouth daily.      Marland Kitchen ONDANSETRON HCL 4 MG PO TABS Oral Take 1 tablet (4 mg total) by mouth every 6 (six) hours. 12 tablet 0  . OXYCODONE-ACETAMINOPHEN 5-325 MG PO TABS Oral Take 2 tablets by mouth every 4 (four) hours as needed for pain. 20 tablet 0  . VARENICLINE TARTRATE 0.5 MG X 11 & 1 MG X 42 PO MISC  Take one 0.5 mg tablet by mouth once daily for 3 days, then increase to one 0.5 mg tablet twice daily for 4 days, then increase to one 1 mg tablet twice daily. 53 tablet 0    BP 131/72  Pulse 60  Temp 97.6 F (36.4 C) (Oral)  Resp 16   SpO2 98%  LMP 10/10/1996  Physical Exam  Constitutional: She is oriented to person, place, and time. She appears well-developed and well-nourished.       Patient is obese. She appears to be very uncomfortable and is writhing around in the bed.  HENT:  Head: Normocephalic and atraumatic.  Eyes: Pupils are equal, round, and reactive to light.  Neck: Normal range of motion. Neck supple.  Cardiovascular: Normal rate, regular rhythm and normal heart sounds.   Pulmonary/Chest: Effort normal and breath sounds normal. No respiratory distress. She has no wheezes. She has no rales. She exhibits no tenderness.  Abdominal: Soft. Bowel sounds are normal. There is tenderness (moderate tenderness to her right mid back and right mid and lower abdomen). There is no rebound and no guarding.  Musculoskeletal: Normal range of motion. She exhibits no edema.  Lymphadenopathy:    She has no cervical adenopathy.  Neurological: She is alert and oriented to person, place, and time.  Skin: Skin is warm and dry. No rash noted.  Psychiatric: She has a normal mood and affect.    ED Course  Procedures (including critical care time)  Results for orders placed during the hospital encounter of 08/04/12  URINALYSIS, ROUTINE W REFLEX MICROSCOPIC      Component Value Range   Color, Urine YELLOW  YELLOW   APPearance CLOUDY (*) CLEAR   Specific Gravity, Urine 1.028  1.005 - 1.030   pH 5.0  5.0 - 8.0   Glucose, UA NEGATIVE  NEGATIVE mg/dL   Hgb urine dipstick LARGE (*) NEGATIVE   Bilirubin Urine SMALL (*) NEGATIVE   Ketones, ur NEGATIVE  NEGATIVE mg/dL   Protein, ur NEGATIVE  NEGATIVE mg/dL   Urobilinogen, UA 1.0  0.0 - 1.0 mg/dL   Nitrite NEGATIVE  NEGATIVE   Leukocytes, UA SMALL (*) NEGATIVE  CBC WITH DIFFERENTIAL      Component Value Range   WBC 6.4  4.0 - 10.5 K/uL   RBC 4.67  3.87 - 5.11 MIL/uL   Hemoglobin 14.2  12.0 - 15.0 g/dL   HCT 08.6  57.8 - 46.9 %   MCV 90.1  78.0 - 100.0 fL   MCH 30.4  26.0 -  34.0 pg   MCHC 33.7  30.0 - 36.0 g/dL   RDW 62.9  52.8 - 41.3 %   Platelets 175  150 - 400 K/uL   Neutrophils Relative 47  43 - 77 %   Neutro Abs 3.0  1.7 - 7.7 K/uL   Lymphocytes Relative 39  12 -  46 %   Lymphs Abs 2.5  0.7 - 4.0 K/uL   Monocytes Relative 12  3 - 12 %   Monocytes Absolute 0.8  0.1 - 1.0 K/uL   Eosinophils Relative 3  0 - 5 %   Eosinophils Absolute 0.2  0.0 - 0.7 K/uL   Basophils Relative 1  0 - 1 %   Basophils Absolute 0.0  0.0 - 0.1 K/uL  COMPREHENSIVE METABOLIC PANEL      Component Value Range   Sodium 140  135 - 145 mEq/L   Potassium 4.1  3.5 - 5.1 mEq/L   Chloride 105  96 - 112 mEq/L   CO2 21  19 - 32 mEq/L   Glucose, Bld 102 (*) 70 - 99 mg/dL   BUN 16  6 - 23 mg/dL   Creatinine, Ser 1.61  0.50 - 1.10 mg/dL   Calcium 9.4  8.4 - 09.6 mg/dL   Total Protein 7.3  6.0 - 8.3 g/dL   Albumin 3.6  3.5 - 5.2 g/dL   AST 16  0 - 37 U/L   ALT 10  0 - 35 U/L   Alkaline Phosphatase 77  39 - 117 U/L   Total Bilirubin 0.3  0.3 - 1.2 mg/dL   GFR calc non Af Amer 63 (*) >90 mL/min   GFR calc Af Amer 73 (*) >90 mL/min  LIPASE, BLOOD      Component Value Range   Lipase 31  11 - 59 U/L  URINE MICROSCOPIC-ADD ON      Component Value Range   Squamous Epithelial / LPF RARE  RARE   WBC, UA 3-6  <3 WBC/hpf   RBC / HPF 21-50  <3 RBC/hpf   Bacteria, UA MANY (*) RARE   Crystals CA OXALATE CRYSTALS (*) NEGATIVE   Urine-Other MUCOUS PRESENT     Ct Abdomen Pelvis Wo Contrast  08/04/2012  *RADIOLOGY REPORT*  Clinical Data: Right flank pain.  Nausea and vomiting.  CT ABDOMEN AND PELVIS WITHOUT CONTRAST  Technique:  Multidetector CT imaging of the abdomen and pelvis was performed following the standard protocol without intravenous contrast.  Comparison: CT abdomen pelvis without contrast 02/21/2010.  Findings: Mild dependent atelectasis is present in the lung bases bilaterally, worse on the right.  No focal nodule, mass, or airspace consolidation is evident.  Heart size is normal.   The liver and spleen are within normal limits.  The stomach, duodenum, and pancreas are within normal limits.  The common bile duct and gallbladder are unremarkable.  The adrenal glands are normal bilaterally.  A 6.8 mm stone is obstructing the distal right ureter with moderate right-sided hydronephrosis.  This may represent the previously seen right lower pole stone.  No additional nephrolithiasis is present on either side.  The left kidney and ureter are unremarkable.  The urinary bladder is mostly collapsed.  The rectosigmoid colon is within normal limits.  Minimal diverticular changes are present without significant diverticulitis.  The remainder of the colon is within normal limits.  The patient is status post appendectomy. The patient is status post hysterectomy.  The adnexa are within normal limits for age.  No significant adenopathy or free fluid is present.  Bone windows demonstrate to mild degenerative changes through the lumbar spine.  No focal lytic or blastic lesions are evident. Degenerative changes are also noted at the pubic symphysis and SI joints.  IMPRESSION:  1.  Obstructing 6.8 mm distal right ureteral stone with moderate right-sided hydronephrosis. 2.  No  additional nephrolithiasis. 3.  Atherosclerosis. 4.  Degenerative changes on the lower lumbar spine have progressed slightly.   Original Report Authenticated By: Jamesetta Orleans. MATTERN, M.D.       1. Kidney stone       MDM  Discussed with Dr. Mena Goes.  Pt is pain controlled after morphine, dilaudid, toradol.  Will d/c with percocet, zofran.  Will f/u with [facility].  Pt advised to return if symptoms worsen.  Did not prescribe flomax given sulfa allergy        Rolan Bucco, MD 08/04/12 1250

## 2012-08-04 NOTE — ED Notes (Signed)
Pt presents to ED today with right sided flank pain that started this am.

## 2012-08-04 NOTE — ED Notes (Signed)
Returned from CT.

## 2012-08-17 ENCOUNTER — Encounter (HOSPITAL_COMMUNITY): Payer: Self-pay | Admitting: Emergency Medicine

## 2012-08-17 ENCOUNTER — Emergency Department (HOSPITAL_COMMUNITY)
Admission: EM | Admit: 2012-08-17 | Discharge: 2012-08-17 | Disposition: A | Discharge status: Home / Self Care | Payer: BC Managed Care – PPO | Attending: Emergency Medicine | Admitting: Emergency Medicine

## 2012-08-17 ENCOUNTER — Emergency Department (HOSPITAL_COMMUNITY): Payer: BC Managed Care – PPO

## 2012-08-17 DIAGNOSIS — R1110 Vomiting, unspecified: Secondary | ICD-10-CM | POA: Insufficient documentation

## 2012-08-17 DIAGNOSIS — I10 Essential (primary) hypertension: Secondary | ICD-10-CM | POA: Insufficient documentation

## 2012-08-17 DIAGNOSIS — Z79899 Other long term (current) drug therapy: Secondary | ICD-10-CM | POA: Insufficient documentation

## 2012-08-17 DIAGNOSIS — N390 Urinary tract infection, site not specified: Secondary | ICD-10-CM | POA: Insufficient documentation

## 2012-08-17 DIAGNOSIS — Z90710 Acquired absence of both cervix and uterus: Secondary | ICD-10-CM | POA: Insufficient documentation

## 2012-08-17 DIAGNOSIS — Z9089 Acquired absence of other organs: Secondary | ICD-10-CM | POA: Insufficient documentation

## 2012-08-17 DIAGNOSIS — Z8679 Personal history of other diseases of the circulatory system: Secondary | ICD-10-CM | POA: Insufficient documentation

## 2012-08-17 DIAGNOSIS — F17200 Nicotine dependence, unspecified, uncomplicated: Secondary | ICD-10-CM | POA: Insufficient documentation

## 2012-08-17 DIAGNOSIS — N23 Unspecified renal colic: Secondary | ICD-10-CM | POA: Insufficient documentation

## 2012-08-17 DIAGNOSIS — Z8669 Personal history of other diseases of the nervous system and sense organs: Secondary | ICD-10-CM | POA: Insufficient documentation

## 2012-08-17 LAB — BASIC METABOLIC PANEL
CO2: 22 mEq/L (ref 19–32)
Calcium: 9.5 mg/dL (ref 8.4–10.5)
Chloride: 103 mEq/L (ref 96–112)
Creatinine, Ser: 1.13 mg/dL — ABNORMAL HIGH (ref 0.50–1.10)
GFR calc Af Amer: 63 mL/min — ABNORMAL LOW (ref >90)
Glucose, Bld: 93 mg/dL (ref 70–99)
Potassium: 4.3 mEq/L (ref 3.5–5.1)

## 2012-08-17 LAB — CBC WITH DIFFERENTIAL/PLATELET
Eosinophils Relative: 1 % (ref 0–5)
HCT: 42.1 % (ref 36.0–46.0)
Lymphocytes Relative: 19 % (ref 12–46)
Lymphs Abs: 1.8 10*3/uL (ref 0.7–4.0)
MCV: 89.8 fL (ref 78.0–100.0)
Monocytes Absolute: 0.8 10*3/uL (ref 0.1–1.0)
RBC: 4.69 MIL/uL (ref 3.87–5.11)
WBC: 9.5 10*3/uL (ref 4.0–10.5)

## 2012-08-17 LAB — URINALYSIS, ROUTINE W REFLEX MICROSCOPIC
Glucose, UA: NEGATIVE mg/dL
Ketones, ur: NEGATIVE mg/dL
Protein, ur: >300 mg/dL — AB

## 2012-08-17 LAB — PREGNANCY, URINE: Preg Test, Ur: NEGATIVE

## 2012-08-17 LAB — URINE MICROSCOPIC-ADD ON

## 2012-08-17 MED ORDER — CIPROFLOXACIN HCL 500 MG PO TABS: 500.0000 mg | ORAL_TABLET | Freq: Two times a day (BID) | ORAL | Status: DC

## 2012-08-17 MED ORDER — SODIUM CHLORIDE 0.9 % IV BOLUS (SEPSIS): 1000.0000 mL | Freq: Once | INTRAVENOUS | Status: DC

## 2012-08-17 MED ORDER — HYDROMORPHONE HCL PF 1 MG/ML IJ SOLN
1.0000 mg | Freq: Once | INTRAMUSCULAR | Status: AC
  Administered 2012-08-17: 1 mg via INTRAVENOUS
  Filled 2012-08-17: qty 1

## 2012-08-17 MED ORDER — ONDANSETRON HCL 4 MG/2ML IJ SOLN
4.0000 mg | Freq: Once | INTRAMUSCULAR | Status: AC
  Administered 2012-08-17: 4 mg via INTRAVENOUS
  Filled 2012-08-17: qty 2

## 2012-08-17 MED ORDER — KETOROLAC TROMETHAMINE 30 MG/ML IJ SOLN
30.0000 mg | Freq: Once | INTRAMUSCULAR | Status: AC
  Administered 2012-08-17: 30 mg via INTRAVENOUS
  Filled 2012-08-17: qty 1

## 2012-08-17 MED ORDER — CIPROFLOXACIN IN D5W 400 MG/200ML IV SOLN
400.0000 mg | Freq: Once | INTRAVENOUS | Status: AC
  Administered 2012-08-17: 400 mg via INTRAVENOUS
  Filled 2012-08-17: qty 200

## 2012-08-17 NOTE — ED Provider Notes (Signed)
History     CSN: [id number]  Arrival date & time 08/17/12  0708   First MD Initiated Contact with Patient 08/17/12 [phone]      Chief Complaint  Patient presents with  . Flank Pain    (Consider location/radiation/quality/duration/timing/severity/associated sxs/prior treatment) The history is provided by the patient.  ZANYA LINDO is a 56 y.o. female stones, hypertension here presenting with right flank pain. She was here on 10/26 and was diagnosed with a 6 mm right kidney stone with moderate hydronephrosis. She followed up with urology yesterday and was given Flomax in addition to Zofran and Percocet. P.m. last night she has worsening right flank pain and she has been vomiting and unable to tolerate by mouth. He fevers or chills or constipation diarrhea. Denies any hematuria or dysuria.    Past Medical History  Diagnosis Date  . Hypertension   . Osteoporosis   . HTN (hypertension) 04/01/2011  . History of partial seizures 04/01/2011  . Mitral valve prolapse     Past Surgical History  Procedure Date  . Appendectomy 1976  . Abdominal hysterectomy 1998    ovaries spared  . Tonsillectomy 1970s  . Knee arthroscopy 2006    left  knee  . Cystectomy     Pt reports removal of a "stone" from the right side of her throat as a child  . Tubal ligation 1996  . Foot sx     X2    Family History  Problem Relation Age of Onset  . Heart disease Mother   . Stroke Mother   . Hypertension Mother   . Diabetes Mother   . Diabetes Sister   . Kidney disease Brother   . Mental retardation Brother   . Cancer Cousin     breast  . Diabetes Sister   . Colon cancer Neg Hx   . Esophageal cancer Neg Hx   . Stomach cancer Neg Hx     History  Substance Use Topics  . Smoking status: Current Every Day Smoker -- 1.0 packs/day for 39 years    Types: Cigarettes  . Smokeless tobacco: Never Used  . Alcohol Use: 4.2 oz/week    7 Cans of beer per week    OB History    Grav Para Term Preterm  Abortions TAB SAB Ect Mult Living   2 2 2   0           Review of Systems  Gastrointestinal: Positive for vomiting and abdominal pain.  All other systems reviewed and are negative.    Allergies  Sulfa antibiotics  Home Medications   Current Outpatient Rx  Name  Route  Sig  Dispense  Refill  . ALBUTEROL SULFATE HFA 108 (90 BASE) MCG/ACT IN AERS   Inhalation   Inhale 2 puffs into the lungs every 6 (six) hours as needed for wheezing.   1 Inhaler   2   . ENALAPRIL MALEATE 20 MG PO TABS   Oral   Take 20 mg by mouth daily.           Marland Kitchen METOPROLOL SUCCINATE ER 50 MG PO TB24   Oral   Take 50 mg by mouth daily.           Marland Kitchen ONDANSETRON HCL 4 MG PO TABS   Oral   Take 1 tablet (4 mg total) by mouth every 6 (six) hours.   12 tablet   0   . OXYCODONE-ACETAMINOPHEN 5-325 MG PO TABS   Oral   Take  2 tablets by mouth every 4 (four) hours as needed. Pain         . TAMSULOSIN HCL 0.4 MG PO CAPS   Oral   Take 0.4 mg by mouth daily.           BP 144/69  Pulse 59  Temp 97.6 F (36.4 C) (Oral)  Resp 21  Ht 5\' 2"  (1.575 m)  Wt 224 lb (101.606 kg)  BMI 40.97 kg/m2  SpO2 98%  LMP 10/10/1996  Physical Exam  Nursing note and vitals reviewed. Constitutional: She is oriented to person, place, and time.       Uncomfortable, writhing around, crying.   HENT:  Head: Normocephalic.  Mouth/Throat: Oropharynx is clear and moist.  Eyes: Conjunctivae normal are normal. Pupils are equal, round, and reactive to light.  Neck: Normal range of motion. Neck supple.  Cardiovascular: Normal rate, regular rhythm and normal heart sounds.   Pulmonary/Chest: Breath sounds normal. No respiratory distress. She has no wheezes. She has no rales.  Abdominal: Soft. Bowel sounds are normal.       + R CVAT.   Musculoskeletal: Normal range of motion. She exhibits no edema.  Neurological: She is alert and oriented to person, place, and time.  Skin: Skin is warm and dry.  Psychiatric: She has a  normal mood and affect. Her behavior is normal. Judgment and thought content normal.    ED Course  Procedures (including critical care time)  Labs Reviewed  BASIC METABOLIC PANEL - Abnormal; Notable for the following:    Creatinine, Ser 1.13 (*)     GFR calc non Af Amer 54 (*)     GFR calc Af Amer 63 (*)     All other components within normal limits  URINALYSIS, ROUTINE W REFLEX MICROSCOPIC - Abnormal; Notable for the following:    APPearance CLOUDY (*)     Hgb urine dipstick MODERATE (*)     Protein, ur >300 (*)     All other components within normal limits  URINE MICROSCOPIC-ADD ON - Abnormal; Notable for the following:    Bacteria, UA FEW (*)     All other components within normal limits  CBC WITH DIFFERENTIAL  PREGNANCY, URINE  URINE CULTURE   US Renal  08/17/2012  *RADIOLOGY REPORT*  Clinical Data: Right flank pain, history kidney stones  RENAL/URINARY TRACT ULTRASOUND COMPLETE  Comparison:  None Correlation:  CT abdomen and pelvis 08/04/2012  Findings:  Right Kidney:  12.1 cm length.  Normal cortical thickness echogenicity.  Mild right hydronephrosis.  No mass or shadowing calcification.  Left Kidney:  11.7 cm length.  Normal cortical thickness and echogenicity.  No mass, hydronephrosis shadowing calcification.  Bladder:  Normal appearance.  Bilateral ureteral jets noted.  IMPRESSION: Mild right hydronephrosis.   Original Report Authenticated By: Ulyses Southward, M.D.      1. Renal colic on right side   2. UTI (urinary tract infection)       MDM  ALLISA EINSPAHR is a 56 y.o. female here with R flank pain. Likely renal colic. Will give pain meds, IVF, antinausea meds and reassess. Will get renal US to r/o severe hydro. She had a diagnosed stone 2 weeks ago and need to find out about whether it is obstructing.   10:33 AM She has mild hydro instead of moderate hydro previously. Labs unremarkable. + UTI again and she had + urine culture several days ago. Pain now under control.  Will d/c home with cipro in addition  to her pain and anti nausea meds. She will f/u with her urologist in a week.        Richardean Canal, MD 08/17/12 1034

## 2012-08-17 NOTE — ED Notes (Signed)
Ultrasound @ in process @ bedside.

## 2012-08-17 NOTE — ED Notes (Signed)
Pt presenting to ed with c/o right side flank pain onset 08/04/12 with worsening pain today. Pt states she had a CT scan done 10/26 that showed kidney stones. Pt denies hematuria at this time. Pt denies fever and chills at this time

## 2012-08-17 NOTE — ED Notes (Signed)
Reports seen Urologist yesterday given pain perscription told renal stone has moved. However, after seeing MD could not keep medication down started having pain to right flank area rated 10/10.

## 2012-08-18 LAB — URINE CULTURE: Colony Count: 35000

## 2012-09-14 ENCOUNTER — Other Ambulatory Visit: Payer: Self-pay | Admitting: Urology

## 2012-09-24 ENCOUNTER — Ambulatory Visit: Payer: BC Managed Care – PPO | Admitting: Family

## 2012-10-01 ENCOUNTER — Encounter (HOSPITAL_BASED_OUTPATIENT_CLINIC_OR_DEPARTMENT_OTHER): Payer: Self-pay

## 2012-10-01 ENCOUNTER — Ambulatory Visit (HOSPITAL_BASED_OUTPATIENT_CLINIC_OR_DEPARTMENT_OTHER): Admit: 2012-10-01 | Payer: Self-pay | Admitting: Urology

## 2012-10-01 SURGERY — CYSTOSCOPY/RETROGRADE/URETEROSCOPY
Anesthesia: General | Laterality: Right

## 2012-10-29 ENCOUNTER — Emergency Department (HOSPITAL_BASED_OUTPATIENT_CLINIC_OR_DEPARTMENT_OTHER)
Admission: EM | Admit: 2012-10-29 | Discharge: 2012-10-29 | Disposition: A | Discharge status: Home / Self Care | Payer: BC Managed Care – PPO | Attending: Emergency Medicine | Admitting: Emergency Medicine

## 2012-10-29 ENCOUNTER — Telehealth: Payer: Self-pay | Admitting: *Deleted

## 2012-10-29 ENCOUNTER — Emergency Department (HOSPITAL_BASED_OUTPATIENT_CLINIC_OR_DEPARTMENT_OTHER): Payer: BC Managed Care – PPO

## 2012-10-29 ENCOUNTER — Encounter (HOSPITAL_BASED_OUTPATIENT_CLINIC_OR_DEPARTMENT_OTHER): Payer: Self-pay | Admitting: *Deleted

## 2012-10-29 DIAGNOSIS — Z8739 Personal history of other diseases of the musculoskeletal system and connective tissue: Secondary | ICD-10-CM | POA: Insufficient documentation

## 2012-10-29 DIAGNOSIS — M549 Dorsalgia, unspecified: Secondary | ICD-10-CM

## 2012-10-29 DIAGNOSIS — I10 Essential (primary) hypertension: Secondary | ICD-10-CM | POA: Insufficient documentation

## 2012-10-29 DIAGNOSIS — M546 Pain in thoracic spine: Secondary | ICD-10-CM | POA: Insufficient documentation

## 2012-10-29 DIAGNOSIS — F17200 Nicotine dependence, unspecified, uncomplicated: Secondary | ICD-10-CM | POA: Insufficient documentation

## 2012-10-29 DIAGNOSIS — Z8679 Personal history of other diseases of the circulatory system: Secondary | ICD-10-CM | POA: Insufficient documentation

## 2012-10-29 DIAGNOSIS — Z79899 Other long term (current) drug therapy: Secondary | ICD-10-CM | POA: Insufficient documentation

## 2012-10-29 LAB — URINE MICROSCOPIC-ADD ON

## 2012-10-29 LAB — URINALYSIS, ROUTINE W REFLEX MICROSCOPIC
Bilirubin Urine: NEGATIVE
Specific Gravity, Urine: >1.046 — ABNORMAL HIGH (ref 1.005–1.030)
Urobilinogen, UA: 0.2 mg/dL (ref 0.0–1.0)
pH: 7.5 (ref 5.0–8.0)

## 2012-10-29 LAB — CBC WITH DIFFERENTIAL/PLATELET
Eosinophils Absolute: 0.1 10*3/uL (ref 0.0–0.7)
Eosinophils Relative: 1 % (ref 0–5)
HCT: 41.8 % (ref 36.0–46.0)
Hemoglobin: 14.3 g/dL (ref 12.0–15.0)
Lymphs Abs: 2.9 10*3/uL (ref 0.7–4.0)
MCH: 30.4 pg (ref 26.0–34.0)
MCV: 88.9 fL (ref 78.0–100.0)
Monocytes Absolute: 0.8 10*3/uL (ref 0.1–1.0)
Monocytes Relative: 9 % (ref 3–12)
Neutrophils Relative %: 59 % (ref 43–77)
RBC: 4.7 MIL/uL (ref 3.87–5.11)

## 2012-10-29 LAB — BASIC METABOLIC PANEL
BUN: 12 mg/dL (ref 6–23)
Creatinine, Ser: 0.9 mg/dL (ref 0.50–1.10)
GFR calc non Af Amer: 71 mL/min — ABNORMAL LOW (ref >90)
Glucose, Bld: 91 mg/dL (ref 70–99)
Potassium: 4 mEq/L (ref 3.5–5.1)

## 2012-10-29 MED ORDER — IBUPROFEN 800 MG PO TABS: 800.0000 mg | ORAL_TABLET | Freq: Three times a day (TID) | ORAL | Status: DC | PRN

## 2012-10-29 MED ORDER — OXYCODONE-ACETAMINOPHEN 5-325 MG PO TABS: 1.0000 | ORAL_TABLET | Freq: Four times a day (QID) | ORAL | Status: DC | PRN

## 2012-10-29 MED ORDER — KETOROLAC TROMETHAMINE 30 MG/ML IJ SOLN
30.0000 mg | Freq: Once | INTRAMUSCULAR | Status: AC
  Administered 2012-10-29: 30 mg via INTRAVENOUS
  Filled 2012-10-29: qty 1

## 2012-10-29 MED ORDER — IOHEXOL 350 MG/ML SOLN
100.0000 mL | Freq: Once | INTRAVENOUS | Status: AC | PRN
  Administered 2012-10-29: 100 mL via INTRAVENOUS

## 2012-10-29 MED ORDER — HYDROMORPHONE HCL PF 1 MG/ML IJ SOLN
1.0000 mg | Freq: Once | INTRAMUSCULAR | Status: AC
  Administered 2012-10-29: 1 mg via INTRAVENOUS
  Filled 2012-10-29: qty 1

## 2012-10-29 MED ORDER — SODIUM CHLORIDE 0.9 % IV BOLUS (SEPSIS)
1000.0000 mL | Freq: Once | INTRAVENOUS | Status: AC
  Administered 2012-10-29: 1000 mL via INTRAVENOUS

## 2012-10-29 MED ORDER — ONDANSETRON HCL 4 MG/2ML IJ SOLN
4.0000 mg | Freq: Once | INTRAMUSCULAR | Status: AC
  Administered 2012-10-29: 4 mg via INTRAVENOUS
  Filled 2012-10-29: qty 2

## 2012-10-29 MED ORDER — CYCLOBENZAPRINE HCL 10 MG PO TABS: 10.0000 mg | ORAL_TABLET | Freq: Three times a day (TID) | ORAL | Status: DC | PRN

## 2012-10-29 NOTE — ED Notes (Signed)
Checked on pt for the UA. Pt not able to get urine at this time.

## 2012-10-29 NOTE — ED Provider Notes (Signed)
History  This chart was scribed for Verdie Barrows B. Bernette Mayers, MD by Ardeen Jourdain, ED Scribe. This patient was seen in room MH01/MH01 and the patient's care was started at 1640.  CSN: [id number]  Arrival date & time 10/29/12  1622   First MD Initiated Contact with Patient 10/29/12 1640      Chief Complaint  Patient presents with  . Back Pain     The history is provided by the patient. No language interpreter was used.    Katie Jacobson is a 57 y.o. female who presents to the Emergency Department complaining of pain between her shoulder blades that began mildly last night and began acutely worsening this morning. She denies any CP, numbness and tingling as associated symptoms. She states she has a h/o HTN but that it is normally under control. She describes the pain as sharp and severe. She denies a h/o pain similar to this. Pt is crying upon exam.    Past Medical History  Diagnosis Date  . Hypertension   . Osteoporosis   . HTN (hypertension) 04/01/2011  . History of partial seizures 04/01/2011  . Mitral valve prolapse     Past Surgical History  Procedure Date  . Appendectomy 1976  . Abdominal hysterectomy 1998    ovaries spared  . Tonsillectomy 1970s  . Knee arthroscopy 2006    left  knee  . Cystectomy     Pt reports removal of a "stone" from the right side of her throat as a child  . Tubal ligation 1996  . Foot sx     X2    Family History  Problem Relation Age of Onset  . Heart disease Mother   . Stroke Mother   . Hypertension Mother   . Diabetes Mother   . Diabetes Sister   . Kidney disease Brother   . Mental retardation Brother   . Cancer Cousin     breast  . Diabetes Sister   . Colon cancer Neg Hx   . Esophageal cancer Neg Hx   . Stomach cancer Neg Hx     History  Substance Use Topics  . Smoking status: Current Every Day Smoker -- 1.0 packs/day for 39 years    Types: Cigarettes  . Smokeless tobacco: Never Used  . Alcohol Use: 4.2 oz/week    7 Cans of  beer per week    OB History    Grav Para Term Preterm Abortions TAB SAB Ect Mult Living   2 2 2   0           Review of Systems  All other systems reviewed and are negative.   A complete 10 system review of systems was obtained and all systems are negative except as noted in the HPI and PMH.    Allergies  Sulfa antibiotics  Home Medications   Current Outpatient Rx  Name  Route  Sig  Dispense  Refill  . ALBUTEROL SULFATE HFA 108 (90 BASE) MCG/ACT IN AERS   Inhalation   Inhale 2 puffs into the lungs every 6 (six) hours as needed for wheezing.   1 Inhaler   2   . CIPROFLOXACIN HCL 500 MG PO TABS   Oral   Take 1 tablet (500 mg total) by mouth 2 (two) times daily. One po bid x 7 days   14 tablet   0   . ENALAPRIL MALEATE 20 MG PO TABS   Oral   Take 20 mg by mouth daily.           Marland Kitchen  METOPROLOL SUCCINATE ER 50 MG PO TB24   Oral   Take 50 mg by mouth daily.           Marland Kitchen ONDANSETRON HCL 4 MG PO TABS   Oral   Take 1 tablet (4 mg total) by mouth every 6 (six) hours.   12 tablet   0   . OXYCODONE-ACETAMINOPHEN 5-325 MG PO TABS   Oral   Take 2 tablets by mouth every 4 (four) hours as needed. Pain         . TAMSULOSIN HCL 0.4 MG PO CAPS   Oral   Take 0.4 mg by mouth daily.           Triage Vitals: BP 156/92  Pulse 74  Temp 98.4 F (36.9 C) (Oral)  Resp 24  SpO2 100%  LMP 10/10/1996  Physical Exam  Nursing note and vitals reviewed. Constitutional: She is oriented to person, place, and time. She appears well-developed and well-nourished.  HENT:  Head: Normocephalic and atraumatic.  Eyes: EOM are normal. Pupils are equal, round, and reactive to light.  Neck: Normal range of motion. Neck supple.  Cardiovascular: Normal rate, regular rhythm, normal heart sounds and intact distal pulses.   Pulmonary/Chest: Effort normal and breath sounds normal. No respiratory distress.  Abdominal: Bowel sounds are normal. She exhibits no distension. There is no  tenderness.  Musculoskeletal: Normal range of motion. She exhibits tenderness. She exhibits no edema.       Diffuse tenderness throughout thoracic spine   Neurological: She is alert and oriented to person, place, and time. She has normal strength. No cranial nerve deficit or sensory deficit.  Skin: Skin is warm and dry. No rash noted.  Psychiatric: She has a normal mood and affect.    ED Course  Procedures (including critical care time)  DIAGNOSTIC STUDIES: Oxygen Saturation is 100% on room air, normal by my interpretation.    COORDINATION OF CARE:  4:43 PM: Discussed treatment plan which includes an EKG, IV fluids, pain medication and a CT with pt at bedside and pt agreed to plan.     Results for orders placed during the hospital encounter of 10/29/12  CBC WITH DIFFERENTIAL      Component Value Range   WBC 9.2  4.0 - 10.5 K/uL   RBC 4.70  3.87 - 5.11 MIL/uL   Hemoglobin 14.3  12.0 - 15.0 g/dL   HCT 40.9  81.1 - 91.4 %   MCV 88.9  78.0 - 100.0 fL   MCH 30.4  26.0 - 34.0 pg   MCHC 34.2  30.0 - 36.0 g/dL   RDW 78.2  95.6 - 21.3 %   Platelets 193  150 - 400 K/uL   Neutrophils Relative 59  43 - 77 %   Neutro Abs 5.4  1.7 - 7.7 K/uL   Lymphocytes Relative 32  12 - 46 %   Lymphs Abs 2.9  0.7 - 4.0 K/uL   Monocytes Relative 9  3 - 12 %   Monocytes Absolute 0.8  0.1 - 1.0 K/uL   Eosinophils Relative 1  0 - 5 %   Eosinophils Absolute 0.1  0.0 - 0.7 K/uL   Basophils Relative 0  0 - 1 %   Basophils Absolute 0.0  0.0 - 0.1 K/uL  BASIC METABOLIC PANEL      Component Value Range   Sodium 141  135 - 145 mEq/L   Potassium 4.0  3.5 - 5.1 mEq/L   Chloride 104  96 - 112 mEq/L   CO2 22  19 - 32 mEq/L   Glucose, Bld 91  70 - 99 mg/dL   BUN 12  6 - 23 mg/dL   Creatinine, Ser 1.47  0.50 - 1.10 mg/dL   Calcium 82.9  8.4 - 56.2 mg/dL   GFR calc non Af Amer 71 (*) >90 mL/min   GFR calc Af Amer 83 (*) >90 mL/min  URINALYSIS, ROUTINE W REFLEX MICROSCOPIC      Component Value Range    Color, Urine YELLOW  YELLOW   APPearance CLEAR  CLEAR   Specific Gravity, Urine >1.046 (*) 1.005 - 1.030   pH 7.5  5.0 - 8.0   Glucose, UA NEGATIVE  NEGATIVE mg/dL   Hgb urine dipstick TRACE (*) NEGATIVE   Bilirubin Urine NEGATIVE  NEGATIVE   Ketones, ur 15 (*) NEGATIVE mg/dL   Protein, ur NEGATIVE  NEGATIVE mg/dL   Urobilinogen, UA 0.2  0.0 - 1.0 mg/dL   Nitrite NEGATIVE  NEGATIVE   Leukocytes, UA NEGATIVE  NEGATIVE  URINE MICROSCOPIC-ADD ON      Component Value Range   Squamous Epithelial / LPF RARE  RARE   RBC / HPF 0-2  <3 RBC/hpf   Bacteria, UA RARE  RARE   Ct Angio Chest Aorta W/cm &/or Wo/cm  10/29/2012  *RADIOLOGY REPORT*  Clinical Data: Back pain.  Rule out aortic dissection.  CT ANGIOGRAPHY CHEST  Technique:  Multidetector CT imaging of the chest using the standard protocol during bolus administration of intravenous contrast. Multiplanar reconstructed images including MIPs were obtained and reviewed to evaluate the vascular anatomy.  Contrast: 200 ml Omnipaque 350  Comparison: None.  Findings: Normal appearance of the thoracic aorta.  No evidence for aortic dissection or aneurysmal dilatation.  The pulmonary arteries are well opacified and no evidence for a pulmonary embolism.  There is no significant pericardial or pleural fluid.  No evidence for chest lymphadenopathy.  The trachea and mainstem bronchi are patent.  Few patchy densities in the posterior lower lobes bilaterally.  Findings are most compatible with areas of atelectasis.  There is no lung consolidation.  There is a punctate calcified granuloma in the right upper lobe.  No acute bony abnormalities.  IMPRESSION: Negative CTA of the chest.  No evidence for aortic dissection or pulmonary embolism.   Original Report Authenticated By: Richarda Overlie, M.D.       No diagnosis found.    MDM   Date: 10/29/2012  Rate: 79  Rhythm: normal sinus rhythm  QRS Axis: normal  Intervals: normal  ST/T Wave abnormalities: normal   Conduction Disutrbances: none  Narrative Interpretation: unremarkable    8:05 PM CT neg for dissection. Pain improved some. Suspect musculoskeletal pain. Advised PCP follow up. Pain meds, muscle relaxers as needed.      I personally performed the services described in this documentation, which was scribed in my presence. The recorded information has been reviewed and is accurate.    Soledad Budreau B. Bernette Mayers, MD 10/29/12 2006

## 2012-10-29 NOTE — ED Notes (Signed)
Pain between her shoulder blades since this am. Slumped down in the wheel chair with half of her body in half out. Eyes closed. Talking with her teeth clinched.

## 2012-10-29 NOTE — ED Notes (Signed)
Returned from CT.  Pt assisted to BR.

## 2012-10-29 NOTE — ED Notes (Signed)
Pt reports upper back pain that started last night and this am continued.  She reports pain has worsened this afternoon.  Pt describes pain is constant, severe between shoulder blades and she is unable to lie still in bed.

## 2012-10-29 NOTE — Telephone Encounter (Signed)
Patient in 10 of 10 scale pain level in upper center of back w/SOB; pt informed to report to ED so that she may get immediate & necessary testing done to r/o major medical issues & immediate emergent Tx, if found necessary, pt understood & agreed; states she is coming in to [facility] ED now, provider informed/SLS

## 2013-09-02 ENCOUNTER — Encounter: Payer: Self-pay | Admitting: Family

## 2013-09-02 ENCOUNTER — Ambulatory Visit (INDEPENDENT_AMBULATORY_CARE_PROVIDER_SITE_OTHER): Payer: BC Managed Care – PPO | Admitting: Family

## 2013-09-02 VITALS — BP 140/90 | HR 72 | Temp 97.9°F | Resp 16 | Ht 60.5 in | Wt 222.1 lb

## 2013-09-02 DIAGNOSIS — Z0000 Encounter for general adult medical examination without abnormal findings: Secondary | ICD-10-CM

## 2013-09-02 DIAGNOSIS — R350 Frequency of micturition: Secondary | ICD-10-CM

## 2013-09-02 DIAGNOSIS — F17200 Nicotine dependence, unspecified, uncomplicated: Secondary | ICD-10-CM

## 2013-09-02 DIAGNOSIS — R109 Unspecified abdominal pain: Secondary | ICD-10-CM

## 2013-09-02 DIAGNOSIS — Z720 Tobacco use: Secondary | ICD-10-CM

## 2013-09-02 DIAGNOSIS — Z23 Encounter for immunization: Secondary | ICD-10-CM

## 2013-09-02 LAB — CBC WITH DIFFERENTIAL/PLATELET
Lymphocytes Relative: 37 % (ref 12–46)
Lymphs Abs: 2.3 10*3/uL (ref 0.7–4.0)
Neutrophils Relative %: 53 % (ref 43–77)
Platelets: 206 10*3/uL (ref 150–400)
RBC: 4.66 MIL/uL (ref 3.87–5.11)
WBC: 6.3 10*3/uL (ref 4.0–10.5)

## 2013-09-02 LAB — HEPATIC FUNCTION PANEL
Albumin: 3.9 g/dL (ref 3.5–5.2)
Alkaline Phosphatase: 76 U/L (ref 39–117)
Indirect Bilirubin: 0.3 mg/dL (ref 0.0–0.9)
Total Bilirubin: 0.4 mg/dL (ref 0.3–1.2)

## 2013-09-02 LAB — BASIC METABOLIC PANEL WITH GFR
Calcium: 9.4 mg/dL (ref 8.4–10.5)
GFR, Est African American: 81 mL/min
Sodium: 141 mEq/L (ref 135–145)

## 2013-09-02 LAB — LIPID PANEL
HDL: 72 mg/dL (ref >39)
LDL Cholesterol: 122 mg/dL — ABNORMAL HIGH (ref 0–99)
Total CHOL/HDL Ratio: 2.9 Ratio

## 2013-09-02 LAB — TSH: TSH: 0.836 u[IU]/mL (ref 0.350–4.500)

## 2013-09-02 NOTE — Progress Notes (Signed)
Pre visit review using our clinic review tool, if applicable. No additional management support is needed unless otherwise documented below in the visit note. 

## 2013-09-02 NOTE — Assessment & Plan Note (Signed)
Counseled on tobacco cessation. Patient reports she is working towards cutting back on her daily use. Patient not taking chantix and smokes 1 pack per day.

## 2013-09-02 NOTE — Progress Notes (Signed)
Subjective:    Patient ID: Katie Jacobson, female    DOB: Sep 26, 1956, 57 y.o.   MRN: [id number]  HPI    Patient presents today for complete physical.  Immunizations:  Patient up to date with tetanus and pneumovax, requesting flu shot. Diet:  Patient reports decrease intake of fried foods, reports to be eating vegetables and lean meats. Exercise: Patient denies regular exercise, has a stationary bike and treadmill. Colonoscopy: Completed 08/05/11 Dexa: Completed 04/01/11 Pap Smear:  Scheduled with GYN Mammogram: Scheduled with GYN  1)Flank pain: Patient has hx of kidney stones, reports right sided flank pain x 2 weeks, started taking Cipro which was prescribed several months ago for kidney stones, reports she discontinued Cipro. Reports pain is not aggravated by anything. Patient reports an increase in urinary frequency over last two years. Patient reports she has to decrease her oral intake because she'll have to go immediately afterwards.     Review of Systems  Constitutional: Negative for activity change, appetite change and unexpected weight change.  Respiratory: Negative for cough and shortness of breath.   Cardiovascular: Negative for chest pain.  Endocrine: Negative for polydipsia and polyuria.  Genitourinary: Negative for dysuria and hematuria.  Neurological: Positive for dizziness. Negative for headaches.       Reports intermittent dizziness once every several months, cannot pinpoint specific time frame, reports she believes it may be due to her glasses. Patient to follow up with optometrist.   Past Medical History  Diagnosis Date  . Hypertension   . Osteoporosis   . HTN (hypertension) 04/01/2011  . History of partial seizures 04/01/2011  . Mitral valve prolapse   . Personal history of kidney stones 05/2012    History   Social History  . Marital Status: Married    Spouse Name: N/A    Number of Children: N/A  . Years of Education: N/A   Occupational History  .  Not on file.   Social History Main Topics  . Smoking status: Current Every Day Smoker -- 1.00 packs/day for 39 years    Types: Cigarettes  . Smokeless tobacco: Never Used  . Alcohol Use: 4.2 oz/week    7 Cans of beer per week  . Drug Use: No  . Sexual Activity: Not on file   Other Topics Concern  . 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   2 daughters- born 16 and 65                   Past Surgical History  Procedure Laterality Date  . Appendectomy  1976  . Abdominal hysterectomy  1998    ovaries spared  . Tonsillectomy  1970s  . Knee arthroscopy  2006    left  knee  . Cystectomy      Pt reports removal of a "stone" from the right side of her throat as a child  . Tubal ligation  1996  . Foot sx      X2    Family History  Problem Relation Age of Onset  . Heart disease Mother   . Stroke Mother   . Hypertension Mother   . Diabetes Mother   . Diabetes Sister   . Kidney disease Brother   . Mental retardation Brother   . Cancer Cousin     breast  . Diabetes Sister   .  Colon cancer Neg Hx   . Esophageal cancer Neg Hx   . Stomach cancer Neg Hx     Allergies  Allergen Reactions  . Sulfa Antibiotics Other (See Comments)    Unknown reaction    Current Outpatient Prescriptions on File Prior to Visit  Medication Sig Dispense Refill  . enalapril (VASOTEC) 20 MG tablet Take 20 mg by mouth daily.        . metoprolol (TOPROL-XL) 50 MG 24 hr tablet Take 50 mg by mouth daily.         No current facility-administered medications on file prior to visit.    BP 140/90  Pulse 72  Temp(Src) 97.9 F (36.6 C) (Oral)  Resp 16  Ht 5' 0.5" (1.537 m)  Wt 222 lb 1.3 oz (100.735 kg)  BMI 42.64 kg/m2  SpO2 99%  LMP 10/10/1996        Objective:   Physical Exam  Constitutional: She is oriented to person, place, and time. She appears well-nourished.  HENT:   Head: Normocephalic.  Right Ear: External ear normal.  Left Ear: External ear normal.  Mouth/Throat: Oropharynx is clear and moist.  Eyes: EOM are normal. Pupils are equal, round, and reactive to light.  Neck: Neck supple. No thyromegaly present.  Cardiovascular: Normal rate, regular rhythm and normal heart sounds.   No murmur heard. Pulmonary/Chest: Effort normal. She has wheezes.  Noted to RUL, patient smokes 1 pack of cigarettes per day.  Abdominal: Soft. Bowel sounds are normal. She exhibits no distension. There is no tenderness.  Musculoskeletal: She exhibits no edema.  Lymphadenopathy:    She has no cervical adenopathy.  Neurological: She is alert and oriented to person, place, and time. She has normal reflexes.  Skin: Skin is warm and dry.  Psychiatric: She has a normal mood and affect.          Assessment & Plan:

## 2013-09-02 NOTE — Assessment & Plan Note (Signed)
Obtain fasting labs and flu shot today.  Patient to schedule PAP and mammogram with her GYN. Patient counseled on diet, exercise, and smoking cessation.

## 2013-09-02 NOTE — Patient Instructions (Signed)
Please complete your lab work prior to leaving. Follow up in 6 months.  

## 2013-09-02 NOTE — Assessment & Plan Note (Signed)
Mild, likely musculoskeletal.  She wishes not to pursue additional imaging at this time unless symptoms worsen.  Pt advised to contact us if symptoms worsen.

## 2013-09-03 ENCOUNTER — Encounter: Payer: Self-pay | Admitting: Family

## 2013-09-03 ENCOUNTER — Other Ambulatory Visit: Payer: Self-pay | Admitting: Cardiovascular Disease

## 2013-09-03 LAB — URINALYSIS, ROUTINE W REFLEX MICROSCOPIC
Ketones, ur: NEGATIVE mg/dL
Nitrite: NEGATIVE
pH: 6 (ref 5.0–8.0)

## 2013-09-03 LAB — URINALYSIS, MICROSCOPIC ONLY
Bacteria, UA: NONE SEEN
Casts: NONE SEEN

## 2013-09-03 LAB — URINE CULTURE: Organism ID, Bacteria: NO GROWTH

## 2013-09-08 ENCOUNTER — Emergency Department (HOSPITAL_COMMUNITY): Payer: BC Managed Care – PPO

## 2013-09-08 ENCOUNTER — Emergency Department (HOSPITAL_COMMUNITY)
Admission: EM | Admit: 2013-09-08 | Discharge: 2013-09-08 | Disposition: A | Discharge status: Home / Self Care | Payer: BC Managed Care – PPO | Attending: Emergency Medicine | Admitting: Emergency Medicine

## 2013-09-08 ENCOUNTER — Encounter (HOSPITAL_COMMUNITY): Payer: Self-pay | Admitting: Emergency Medicine

## 2013-09-08 DIAGNOSIS — Z87442 Personal history of urinary calculi: Secondary | ICD-10-CM | POA: Insufficient documentation

## 2013-09-08 DIAGNOSIS — Z8739 Personal history of other diseases of the musculoskeletal system and connective tissue: Secondary | ICD-10-CM | POA: Insufficient documentation

## 2013-09-08 DIAGNOSIS — Z9851 Tubal ligation status: Secondary | ICD-10-CM | POA: Insufficient documentation

## 2013-09-08 DIAGNOSIS — F17200 Nicotine dependence, unspecified, uncomplicated: Secondary | ICD-10-CM | POA: Insufficient documentation

## 2013-09-08 DIAGNOSIS — R109 Unspecified abdominal pain: Secondary | ICD-10-CM

## 2013-09-08 DIAGNOSIS — Z8669 Personal history of other diseases of the nervous system and sense organs: Secondary | ICD-10-CM | POA: Insufficient documentation

## 2013-09-08 DIAGNOSIS — Z79899 Other long term (current) drug therapy: Secondary | ICD-10-CM | POA: Insufficient documentation

## 2013-09-08 DIAGNOSIS — I10 Essential (primary) hypertension: Secondary | ICD-10-CM | POA: Insufficient documentation

## 2013-09-08 DIAGNOSIS — Z90710 Acquired absence of both cervix and uterus: Secondary | ICD-10-CM | POA: Insufficient documentation

## 2013-09-08 DIAGNOSIS — Z9089 Acquired absence of other organs: Secondary | ICD-10-CM | POA: Insufficient documentation

## 2013-09-08 DIAGNOSIS — Z3202 Encounter for pregnancy test, result negative: Secondary | ICD-10-CM | POA: Insufficient documentation

## 2013-09-08 DIAGNOSIS — R350 Frequency of micturition: Secondary | ICD-10-CM | POA: Insufficient documentation

## 2013-09-08 LAB — URINALYSIS, ROUTINE W REFLEX MICROSCOPIC
Leukocytes, UA: NEGATIVE
Nitrite: NEGATIVE
Specific Gravity, Urine: 1.019 (ref 1.005–1.030)
Urobilinogen, UA: 0.2 mg/dL (ref 0.0–1.0)
pH: 8 (ref 5.0–8.0)

## 2013-09-08 LAB — COMPREHENSIVE METABOLIC PANEL
ALT: 12 U/L (ref 0–35)
AST: 15 U/L (ref 0–37)
Albumin: 3.4 g/dL — ABNORMAL LOW (ref 3.5–5.2)
Alkaline Phosphatase: 80 U/L (ref 39–117)
BUN: 9 mg/dL (ref 6–23)
CO2: 21 mEq/L (ref 19–32)
Calcium: 9.4 mg/dL (ref 8.4–10.5)
Chloride: 103 mEq/L (ref 96–112)
Creatinine, Ser: 0.85 mg/dL (ref 0.50–1.10)
GFR calc Af Amer: 88 mL/min — ABNORMAL LOW (ref >90)
GFR calc non Af Amer: 76 mL/min — ABNORMAL LOW (ref >90)
Glucose, Bld: 89 mg/dL (ref 70–99)
Potassium: 3.6 mEq/L (ref 3.5–5.1)
Sodium: 137 mEq/L (ref 135–145)
Total Bilirubin: 0.5 mg/dL (ref 0.3–1.2)
Total Protein: 7 g/dL (ref 6.0–8.3)

## 2013-09-08 LAB — URINE MICROSCOPIC-ADD ON

## 2013-09-08 LAB — CBC WITH DIFFERENTIAL/PLATELET
Basophils Absolute: 0 10*3/uL (ref 0.0–0.1)
HCT: 41.9 % (ref 36.0–46.0)
Lymphocytes Relative: 29 % (ref 12–46)
Neutro Abs: 4.9 10*3/uL (ref 1.7–7.7)
Neutrophils Relative %: 64 % (ref 43–77)
Platelets: 264 10*3/uL (ref 150–400)
RDW: 15.4 % (ref 11.5–15.5)
WBC: 7.7 10*3/uL (ref 4.0–10.5)

## 2013-09-08 LAB — POCT PREGNANCY, URINE: Preg Test, Ur: NEGATIVE

## 2013-09-08 LAB — LIPASE, BLOOD: Lipase: 22 U/L (ref 11–59)

## 2013-09-08 MED ORDER — SODIUM CHLORIDE 0.9 % IV BOLUS (SEPSIS)
1000.0000 mL | Freq: Once | INTRAVENOUS | Status: AC
  Administered 2013-09-08: 1000 mL via INTRAVENOUS

## 2013-09-08 MED ORDER — ONDANSETRON HCL 4 MG/2ML IJ SOLN
4.0000 mg | Freq: Once | INTRAMUSCULAR | Status: AC
  Administered 2013-09-08: 4 mg via INTRAVENOUS
  Filled 2013-09-08: qty 2

## 2013-09-08 MED ORDER — HYDROMORPHONE HCL PF 1 MG/ML IJ SOLN
1.0000 mg | Freq: Once | INTRAMUSCULAR | Status: AC
  Administered 2013-09-08: 1 mg via INTRAVENOUS
  Filled 2013-09-08: qty 1

## 2013-09-08 MED ORDER — ONDANSETRON HCL 4 MG PO TABS: 4.0000 mg | ORAL_TABLET | Freq: Four times a day (QID) | ORAL | Status: DC

## 2013-09-08 MED ORDER — OXYCODONE-ACETAMINOPHEN 5-325 MG PO TABS: 1.0000 | ORAL_TABLET | Freq: Four times a day (QID) | ORAL | Status: DC | PRN

## 2013-09-08 NOTE — ED Notes (Signed)
Phlebotomy at bedside to re collect labs

## 2013-09-08 NOTE — ED Provider Notes (Signed)
CSN: [id number]     Arrival date & time 09/08/13  0940 History   First MD Initiated Contact with Patient 09/08/13 0957     Chief Complaint  Patient presents with  . Flank Pain   (Consider location/radiation/quality/duration/timing/severity/associated sxs/prior Treatment) HPI Comments: Patient is a 57 year old female with history of prior kidney stones who presents today with sudden onset of severe right-sided flank pain. The pain began last night and is a dull pain that begins in her right upper back and radiates into her right lower abdomen. She has associated urinary frequency. This feels like her prior kidney stone. She denies any hematuria, dysuria, fever. She reports that she also has problems with chronic back pain. This does not feel like her normal back pain. No hx of CA, drug use, or bowel or bladder incontinence.   The history is provided by the patient. No language interpreter was used.    Past Medical History  Diagnosis Date  . Hypertension   . Osteoporosis   . HTN (hypertension) 04/01/2011  . History of partial seizures 04/01/2011  . Mitral valve prolapse   . Personal history of kidney stones 05/2012   Past Surgical History  Procedure Laterality Date  . Appendectomy  1976  . Abdominal hysterectomy  1998    ovaries spared  . Tonsillectomy  1970s  . Knee arthroscopy  2006    left  knee  . Cystectomy      Pt reports removal of a "stone" from the right side of her throat as a child  . Tubal ligation  1996  . Foot sx      X2   Family History  Problem Relation Age of Onset  . Heart disease Mother   . Stroke Mother   . Hypertension Mother   . Diabetes Mother   . Diabetes Sister   . Kidney disease Brother   . Mental retardation Brother   . Cancer Cousin     breast  . Diabetes Sister   . Colon cancer Neg Hx   . Esophageal cancer Neg Hx   . Stomach cancer Neg Hx    History  Substance Use Topics  . Smoking status: Current Every Day Smoker -- 1.00 packs/day for 39  years    Types: Cigarettes  . Smokeless tobacco: Never Used  . Alcohol Use: 4.2 oz/week    7 Cans of beer per week   OB History   Grav Para Term Preterm Abortions TAB SAB Ect Mult Living   2 2 2   0          Review of Systems  Constitutional: Negative for fever and chills.  Respiratory: Negative for shortness of breath.   Cardiovascular: Negative for chest pain.  Gastrointestinal: Negative for nausea and vomiting.  Genitourinary: Positive for frequency and flank pain. Negative for vaginal bleeding and vaginal discharge.  All other systems reviewed and are negative.    Allergies  Sulfa antibiotics  Home Medications   Current Outpatient Rx  Name  Route  Sig  Dispense  Refill  . enalapril (VASOTEC) 20 MG tablet      TAKE 1 TABLET EVERY DAY   30 tablet   4   . metoprolol (TOPROL-XL) 50 MG 24 hr tablet   Oral   Take 50 mg by mouth daily.            BP 160/99  Pulse 86  Temp(Src) 97.5 F (36.4 C) (Oral)  Resp 20  SpO2 100%  LMP 10/10/1996 Physical  Exam  Nursing note and vitals reviewed. Constitutional: She is oriented to person, place, and time. She appears well-developed and well-nourished. She appears distressed.  HENT:  Head: Normocephalic and atraumatic.  Right Ear: External ear normal.  Left Ear: External ear normal.  Nose: Nose normal.  Mouth/Throat: Oropharynx is clear and moist.  Eyes: Conjunctivae are normal.  Neck: Normal range of motion.  Cardiovascular: Normal rate, regular rhythm and normal heart sounds.   Pulmonary/Chest: Effort normal and breath sounds normal. No stridor. No respiratory distress. She has no wheezes. She has no rales.  Abdominal: Soft. She exhibits no distension. There is no tenderness. There is no rigidity and no guarding.  Musculoskeletal: Normal range of motion.       Back:  Neurological: She is alert and oriented to person, place, and time. She has normal strength.  Skin: Skin is warm and dry. She is not diaphoretic. No  erythema.  Psychiatric: She has a normal mood and affect. Her behavior is normal.    ED Course  Procedures (including critical care time) Labs Review Labs Reviewed  URINALYSIS, ROUTINE W REFLEX MICROSCOPIC - Abnormal; Notable for the following:    Hgb urine dipstick TRACE (*)    Ketones, ur 40 (*)    All other components within normal limits  COMPREHENSIVE METABOLIC PANEL - Abnormal; Notable for the following:    Albumin 3.4 (*)    GFR calc non Af Amer 76 (*)    GFR calc Af Amer 88 (*)    All other components within normal limits  CBC WITH DIFFERENTIAL  LIPASE, BLOOD  URINE MICROSCOPIC-ADD ON  POCT PREGNANCY, URINE   Imaging Review Ct Abdomen Pelvis Wo Contrast  09/08/2013   CLINICAL DATA:  Right flank pain.  History of nephrolithiasis.  EXAM: CT ABDOMEN AND PELVIS WITHOUT CONTRAST  TECHNIQUE: Multidetector CT imaging of the abdomen and pelvis was performed following the standard protocol without intravenous contrast.  COMPARISON:  10/29/2012  FINDINGS: Visualized lung bases clear. Unremarkable liver, spleen, adrenal glands, pancreas, gallbladder, kidneys. No nephrolithiasis or hydronephrosis. No ureteral calculus. Unenhanced CT was performed per clinician order. Lack of IV contrast limits sensitivity and specificity, especially for evaluation of abdominal/pelvic solid viscera.  Patchy aortoiliac calcifications without aneurysm. Stomach, small bowel, and colon are nondilated. Appendix surgically absent by report. Urinary bladder is physiologically distended. Stable bilateral pelvic phleboliths. Uterus surgically absent. No ascites. No free air. No adenopathy localized. Degenerative disc disease L2-L5. Bilateral sacroiliitis as before.  IMPRESSION: 1. Negative for nephrolithiasis, hydronephrosis, or other acute abnormality.   Electronically Signed   By: Oley Balm M.D.   On: 09/08/2013 11:06    EKG Interpretation   None       MDM   1. Right flank pain    Patient presents  with right sided flank pain with extension into her right lower leg. Labs and imaging are unremarkable. Pain controlled in ED. Likely MSK in nature. No red flags. Will d/c with pain medication and f/u with pcp. Dr. Juleen China evaluated patient and agrees with plan. Return instructions given. Vital signs stable for discharge. Patient / Family / Caregiver informed of clinical course, understand medical decision-making process, and agree with plan.   Mora Bellman, PA-C 09/08/13 1517

## 2013-09-08 NOTE — ED Notes (Signed)
Pt aware of the need for urine sample  

## 2013-09-08 NOTE — ED Notes (Signed)
Pt reports right sided flank pain 10/10 starting yesterday, hx of kidney stones. Denies dysuria.

## 2013-09-08 NOTE — ED Notes (Signed)
Unable to establish PIV, medications (zofran and dilaudid) given IM per VO from [place]

## 2013-09-08 NOTE — ED Notes (Signed)
IV team at bedside 

## 2013-09-08 NOTE — ED Notes (Signed)
Received call from lab stating CMP was grossly hemolyzed, requesting to redraw sample.

## 2013-09-08 NOTE — ED Notes (Signed)
Bed: WA10 Expected date:  Expected time:  Means of arrival:  Comments: 

## 2013-09-10 NOTE — ED Provider Notes (Signed)
Medical screening examination/treatment/procedure(s) were conducted as a shared visit with non-physician practitioner(s) and myself.  I personally evaluated the patient during the encounter.  EKG Interpretation   None      56yF with R lower back pain. Pt reports feels similar to prior kidney stones, but her exam is more consistent with musculoskeletal etiology. Pain with palpation along R SI joint and ischial tuberosity. No concerning skin lesions. No midline tenderness. Pain increased with hip flexion. W/u pretty unremarkable. Plan symptomatic tx.   Raeford Razor, MD 09/10/13 [phone]

## 2013-09-11 ENCOUNTER — Ambulatory Visit (INDEPENDENT_AMBULATORY_CARE_PROVIDER_SITE_OTHER): Payer: BC Managed Care – PPO | Admitting: Physician Assistant

## 2013-09-11 ENCOUNTER — Encounter: Payer: Self-pay | Admitting: Family Medicine

## 2013-09-11 ENCOUNTER — Ambulatory Visit (INDEPENDENT_AMBULATORY_CARE_PROVIDER_SITE_OTHER): Payer: BC Managed Care – PPO | Admitting: Family Medicine

## 2013-09-11 ENCOUNTER — Encounter: Payer: Self-pay | Admitting: Physician Assistant

## 2013-09-11 VITALS — BP 155/83 | HR 71 | Ht 61.0 in | Wt 222.0 lb

## 2013-09-11 VITALS — BP 168/90 | HR 71 | Temp 98.2°F | Resp 24 | Ht 61.0 in | Wt 222.8 lb

## 2013-09-11 DIAGNOSIS — M545 Low back pain, unspecified: Secondary | ICD-10-CM | POA: Insufficient documentation

## 2013-09-11 DIAGNOSIS — M25559 Pain in unspecified hip: Secondary | ICD-10-CM

## 2013-09-11 DIAGNOSIS — M25551 Pain in right hip: Secondary | ICD-10-CM

## 2013-09-11 MED ORDER — KETOROLAC TROMETHAMINE 30 MG/ML IJ SOLN
30.0000 mg | Freq: Once | INTRAMUSCULAR | Status: AC
  Administered 2013-09-11: 30 mg via INTRAMUSCULAR

## 2013-09-11 MED ORDER — KETOROLAC TROMETHAMINE 30 MG/ML IJ SOLN: 30.0000 mg | Freq: Once | INTRAMUSCULAR | Status: DC

## 2013-09-11 MED ORDER — DIAZEPAM 5 MG PO TABS: 5.0000 mg | ORAL_TABLET | Freq: Four times a day (QID) | ORAL | Status: DC | PRN

## 2013-09-11 MED ORDER — OXYCODONE-ACETAMINOPHEN 7.5-325 MG PO TABS: 1.0000 | ORAL_TABLET | Freq: Four times a day (QID) | ORAL | Status: DC | PRN

## 2013-09-11 MED ORDER — METHYLPREDNISOLONE (PAK) 4 MG PO TABS: ORAL_TABLET | ORAL | Status: DC

## 2013-09-11 NOTE — Progress Notes (Signed)
Patient ID: Katie Jacobson, female   DOB: 31-Jan-1956, 57 y.o.   MRN: [id number]  Patient presents to clinic today c/o low back pain in sacrum radiating into right hip and thigh.  Denies numbness, tingling or weakness.  Pain has been present for 6-7 days.  Patient was evaluated in ER -- labs and CT abdomen negative for acute process.  Pain has worsened over the past day.  Denies recent trauma or injury.  Is overweight.  Is not getting relief from tylenol and percocet given in ER.  Past Medical History  Diagnosis Date  . Hypertension   . Osteoporosis   . HTN (hypertension) 04/01/2011  . History of partial seizures 04/01/2011  . Mitral valve prolapse   . Personal history of kidney stones 05/2012    Current Outpatient Prescriptions on File Prior to Visit  Medication Sig Dispense Refill  . enalapril (VASOTEC) 20 MG tablet Take 20 mg by mouth at bedtime.      . metoprolol (TOPROL-XL) 50 MG 24 hr tablet Take 50 mg by mouth at bedtime.       . ondansetron (ZOFRAN) 4 MG tablet Take 1 tablet (4 mg total) by mouth every 6 (six) hours.  12 tablet  0   No current facility-administered medications on file prior to visit.    Allergies  Allergen Reactions  . Sulfa Antibiotics Other (See Comments)    Unknown reaction    Family History  Problem Relation Age of Onset  . Heart disease Mother   . Stroke Mother   . Hypertension Mother   . Diabetes Mother   . Diabetes Sister   . Kidney disease Brother   . Mental retardation Brother   . Cancer Cousin     breast  . Diabetes Sister   . Colon cancer Neg Hx   . Esophageal cancer Neg Hx   . Stomach cancer Neg Hx     History   Social History  . Marital Status: Married    Spouse Name: N/A    Number of Children: N/A  . Years of Education: N/A   Social History Main Topics  . Smoking status: Current Every Day Smoker -- 1.00 packs/day for 39 years    Types: Cigarettes  . Smokeless tobacco: Never Used  . Alcohol Use: 4.2 oz/week    7 Cans of  beer per week  . Drug Use: No  . Sexual Activity: None   Other Topics Concern  . 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   2 daughters- born 73 and 26                  Review of Systems - See HPI.  All other ROS are negative.  Filed Vitals:   09/11/13 1345  BP: 168/90  Pulse: 71  Temp: 98.2 F (36.8 C)  Resp: 24    Physical Exam  Vitals reviewed. Constitutional: She is oriented to person, place, and time.  Overweight AA female in moderate painful distress.  HENT:  Head: Normocephalic.  Eyes: Conjunctivae are normal.  Neck: Neck supple.  Cardiovascular: Normal rate, regular rhythm, normal heart sounds and intact distal pulses.   Pulmonary/Chest: Effort normal and breath sounds normal.  Musculoskeletal: She exhibits tenderness.       Right hip: She exhibits tenderness. She exhibits normal range of motion, no bony tenderness, no swelling, no  crepitus and no deformity.       Left hip: Normal.       Right knee: Normal.       Cervical back: Normal.       Thoracic back: Normal.       Lumbar back: She exhibits tenderness, pain and spasm. She exhibits normal range of motion, no bony tenderness, no swelling and no deformity.  Neurological: She is alert and oriented to person, place, and time. No cranial nerve deficit.  Skin: Skin is warm and dry. No rash noted.  Psychiatric: Affect normal.   Recent Results (from the past 2160 hour(s))  URINE CULTURE     Status: None   Collection Time    09/02/13 10:44 AM      Result Value Range   Colony Count NO GROWTH     Organism ID, Bacteria NO GROWTH    BASIC METABOLIC PANEL WITH GFR     Status: None   Collection Time    09/02/13 11:28 AM      Result Value Range   Sodium 141  135 - 145 mEq/L   Potassium 4.5  3.5 - 5.3 mEq/L   Chloride 107  96 - 112 mEq/L   CO2 27  19 - 32 mEq/L   Glucose, Bld 75  70 - 99 mg/dL   BUN 11  6 - 23  mg/dL   Creat 6.96  2.95 - 2.84 mg/dL   Calcium 9.4  8.4 - 13.2 mg/dL   GFR, Est African American 81     GFR, Est Non African American 70     Comment:       The estimated GFR is a calculation valid for adults (>=70 years old)     that uses the CKD-EPI algorithm to adjust for age and sex. It is       not to be used for children, pregnant women, hospitalized patients,        patients on dialysis, or with rapidly changing kidney function.     According to the NKDEP, eGFR >89 is normal, 60-89 shows mild     impairment, 30-59 shows moderate impairment, 15-29 shows severe     impairment and <15 is ESRD.        CBC WITH DIFFERENTIAL     Status: Abnormal   Collection Time    09/02/13 11:28 AM      Result Value Range   WBC 6.3  4.0 - 10.5 K/uL   RBC 4.66  3.87 - 5.11 MIL/uL   Hemoglobin 14.1  12.0 - 15.0 g/dL   HCT 44.0  10.2 - 72.5 %   MCV 89.3  78.0 - 100.0 fL   MCH 30.3  26.0 - 34.0 pg   MCHC 33.9  30.0 - 36.0 g/dL   RDW 36.6 (*) 44.0 - 34.7 %   Platelets 206  150 - 400 K/uL   Neutrophils Relative % 53  43 - 77 %   Neutro Abs 3.3  1.7 - 7.7 K/uL   Lymphocytes Relative 37  12 - 46 %   Lymphs Abs 2.3  0.7 - 4.0 K/uL   Monocytes Relative 7  3 - 12 %   Monocytes Absolute 0.5  0.1 - 1.0 K/uL   Eosinophils Relative 2  0 - 5 %   Eosinophils Absolute 0.1  0.0 - 0.7 K/uL   Basophils Relative 1  0 - 1 %   Basophils Absolute 0.0  0.0 - 0.1 K/uL   Smear Review  Criteria for review not met    HEPATIC FUNCTION PANEL     Status: None   Collection Time    09/02/13 11:28 AM      Result Value Range   Total Bilirubin 0.4  0.3 - 1.2 mg/dL   Bilirubin, Direct 0.1  0.0 - 0.3 mg/dL   Indirect Bilirubin 0.3  0.0 - 0.9 mg/dL   Alkaline Phosphatase 76  39 - 117 U/L   AST 13  0 - 37 U/L   ALT 11  0 - 35 U/L   Total Protein 6.6  6.0 - 8.3 g/dL   Albumin 3.9  3.5 - 5.2 g/dL  LIPID PANEL     Status: Abnormal   Collection Time    09/02/13 11:28 AM      Result Value Range   Cholesterol 207 (*) 0 - 200  mg/dL   Comment: ATP III Classification:           < 200        mg/dL        Desirable          200 - 239     mg/dL        Borderline High          >= 240        mg/dL        High         Triglycerides 66  <150 mg/dL   HDL 72  >16 mg/dL   Total CHOL/HDL Ratio 2.9     VLDL 13  0 - 40 mg/dL   LDL Cholesterol 109 (*) 0 - 99 mg/dL   Comment:       Total Cholesterol/HDL Ratio:CHD Risk                            Coronary Heart Disease Risk Table                                            Men       Women              1/2 Average Risk              3.4        3.3                  Average Risk              5.0        4.4               2X Average Risk              9.6        7.1               3X Average Risk             23.4       11.0     Use the calculated Patient Ratio above and the CHD Risk table      to determine the patient's CHD Risk.     ATP III Classification (LDL):           < 100        mg/dL         Optimal  100 - 129     mg/dL         Near or Above Optimal          130 - 159     mg/dL         Borderline High          160 - 189     mg/dL         High           > 190        mg/dL         Very High        TSH     Status: None   Collection Time    09/02/13 11:28 AM      Result Value Range   TSH 0.836  0.350 - 4.500 uIU/mL  URINALYSIS, ROUTINE W REFLEX MICROSCOPIC     Status: Abnormal   Collection Time    09/02/13 11:28 AM      Result Value Range   Color, Urine YELLOW  YELLOW   APPearance CLEAR  CLEAR   Specific Gravity, Urine 1.017  1.005 - 1.030   pH 6.0  5.0 - 8.0   Glucose, UA NEG  NEG mg/dL   Bilirubin Urine NEG  NEG   Ketones, ur NEG  NEG mg/dL   Hgb urine dipstick TRACE (*) NEG   Protein, ur NEG  NEG mg/dL   Urobilinogen, UA 0.2  0.0 - 1.0 mg/dL   Nitrite NEG  NEG   Leukocytes, UA NEG  NEG  URINALYSIS, MICROSCOPIC ONLY     Status: None   Collection Time    09/02/13 11:28 AM      Result Value Range   Squamous Epithelial / LPF NONE SEEN  RARE   Crystals  NONE SEEN  NONE SEEN   Casts NONE SEEN  NONE SEEN   WBC, UA 0-2  <3 WBC/hpf   RBC / HPF 0-2  <3 RBC/hpf   Bacteria, UA NONE SEEN  RARE  CBC WITH DIFFERENTIAL     Status: None   Collection Time    09/08/13 10:40 AM      Result Value Range   WBC 7.7  4.0 - 10.5 K/uL   RBC 4.67  3.87 - 5.11 MIL/uL   Hemoglobin 14.7  12.0 - 15.0 g/dL   HCT 29.5  62.1 - 30.8 %   MCV 89.7  78.0 - 100.0 fL   MCH 31.5  26.0 - 34.0 pg   MCHC 35.1  30.0 - 36.0 g/dL   RDW 65.7  84.6 - 96.2 %   Platelets 264  150 - 400 K/uL   Neutrophils Relative % 64  43 - 77 %   Neutro Abs 4.9  1.7 - 7.7 K/uL   Lymphocytes Relative 29  12 - 46 %   Lymphs Abs 2.2  0.7 - 4.0 K/uL   Monocytes Relative 7  3 - 12 %   Monocytes Absolute 0.5  0.1 - 1.0 K/uL   Eosinophils Relative 0  0 - 5 %   Eosinophils Absolute 0.0  0.0 - 0.7 K/uL   Basophils Relative 0  0 - 1 %   Basophils Absolute 0.0  0.0 - 0.1 K/uL  COMPREHENSIVE METABOLIC PANEL     Status: Abnormal   Collection Time    09/08/13 11:20 AM      Result Value Range   Sodium 137  135 - 145 mEq/L   Potassium 3.6  3.5 -  5.1 mEq/L   Chloride 103  96 - 112 mEq/L   CO2 21  19 - 32 mEq/L   Glucose, Bld 89  70 - 99 mg/dL   BUN 9  6 - 23 mg/dL   Creatinine, Ser 1.61  0.50 - 1.10 mg/dL   Calcium 9.4  8.4 - 09.6 mg/dL   Total Protein 7.0  6.0 - 8.3 g/dL   Albumin 3.4 (*) 3.5 - 5.2 g/dL   AST 15  0 - 37 U/L   ALT 12  0 - 35 U/L   Alkaline Phosphatase 80  39 - 117 U/L   Total Bilirubin 0.5  0.3 - 1.2 mg/dL   GFR calc non Af Amer 76 (*) >90 mL/min   GFR calc Af Amer 88 (*) >90 mL/min   Comment: (NOTE)     The eGFR has been calculated using the CKD EPI equation.     This calculation has not been validated in all clinical situations.     eGFR's persistently <90 mL/min signify possible Chronic Kidney     Disease.  LIPASE, BLOOD     Status: None   Collection Time    09/08/13 11:20 AM      Result Value Range   Lipase 22  11 - 59 U/L  URINALYSIS, ROUTINE W REFLEX MICROSCOPIC      Status: Abnormal   Collection Time    09/08/13 12:58 PM      Result Value Range   Color, Urine YELLOW  YELLOW   APPearance CLEAR  CLEAR   Specific Gravity, Urine 1.019  1.005 - 1.030   pH 8.0  5.0 - 8.0   Glucose, UA NEGATIVE  NEGATIVE mg/dL   Hgb urine dipstick TRACE (*) NEGATIVE   Bilirubin Urine NEGATIVE  NEGATIVE   Ketones, ur 40 (*) NEGATIVE mg/dL   Protein, ur NEGATIVE  NEGATIVE mg/dL   Urobilinogen, UA 0.2  0.0 - 1.0 mg/dL   Nitrite NEGATIVE  NEGATIVE   Leukocytes, UA NEGATIVE  NEGATIVE  URINE MICROSCOPIC-ADD ON     Status: None   Collection Time    09/08/13 12:58 PM      Result Value Range   Squamous Epithelial / LPF RARE  RARE   RBC / HPF 3-6  <3 RBC/hpf  POCT PREGNANCY, URINE     Status: None   Collection Time    09/08/13  1:15 PM      Result Value Range   Preg Test, Ur NEGATIVE  NEGATIVE   Comment:            THE SENSITIVITY OF THIS     METHODOLOGY IS >24 mIU/mL   Assessment/Plan: Low back pain radiating to right leg Toradol injection given in clinic with some improvement in pain. Continue pain medications.  Medrol dose pack.  Follow-up with Sports Medicine for further evaluation of hip component to pain.  If symptoms persist, will need MRI lumbar spine.

## 2013-09-11 NOTE — Patient Instructions (Signed)
Please go downstairs to 2nd floor for appointment with Dr. Norton Blizzard in Sports Medicine. Continue other medications as prescribed.  Rest.  Apply topical Aspercreme or Salon Pas to affected area.

## 2013-09-11 NOTE — Progress Notes (Signed)
Pre visit review using our clinic review tool, if applicable. No additional management support is needed unless otherwise documented below in the visit note/SLS  

## 2013-09-11 NOTE — Patient Instructions (Signed)
Take the medrol dose pack as directed. Percocet as needed for severe pain. Valium as needed for muscle spasms. Follow up with me in 1 week. If not improving would consider MRI lumbar spine.  But also consider seeing your PCP to discuss whether further imaging(CT with contrast, Pelvic ultrasound) is warranted.

## 2013-09-11 NOTE — Assessment & Plan Note (Addendum)
Toradol injection given in clinic with some improvement in pain. Continue pain medications.  Medrol dose pack.  Follow-up with Sports Medicine for further evaluation of hip component to pain.  If symptoms persist, will need MRI lumbar spine.

## 2013-09-12 ENCOUNTER — Encounter: Payer: Self-pay | Admitting: Family Medicine

## 2013-09-12 NOTE — Progress Notes (Signed)
Patient ID: Katie Jacobson, female   DOB: 30-Sep-1956, 57 y.o.   MRN: [id number]  PCP: Lemont Fillers., NP  Subjective:   HPI: Patient is a 57 y.o. female here for right hip pain.  Patient reports about 1 1/2 weeks ago she started to develop rather sudden onset right flank pain radiating around to abdomen, right hip. No known injury. Felt similar to kidney stone she's had in the past. Denies radiation down leg. No numbness or tingling. No bowel/bladder dysfunction. No dysuria, hematuria, other complaints. No fevers, chill, sweats (has hot flashes). Tried muscle relaxant.  Has been given toradol injections. Workup in ED (labwork, CT without contrast) negative, was advised this was likely musculoskeletal pain. Given Rx for medrol dose pack at PCP's office today but not filled yet.  Past Medical History  Diagnosis Date  . Hypertension   . Osteoporosis   . HTN (hypertension) 04/01/2011  . History of partial seizures 04/01/2011  . Mitral valve prolapse   . Personal history of kidney stones 05/2012    Current Outpatient Prescriptions on File Prior to Visit  Medication Sig Dispense Refill  . enalapril (VASOTEC) 20 MG tablet Take 20 mg by mouth at bedtime.      . methylPREDNIsolone (MEDROL DOSPACK) 4 MG tablet follow package directions  21 tablet  0  . metoprolol (TOPROL-XL) 50 MG 24 hr tablet Take 50 mg by mouth at bedtime.       . ondansetron (ZOFRAN) 4 MG tablet Take 1 tablet (4 mg total) by mouth every 6 (six) hours.  12 tablet  0   No current facility-administered medications on file prior to visit.    Past Surgical History  Procedure Laterality Date  . Appendectomy  1976  . Abdominal hysterectomy  1998    ovaries spared  . Tonsillectomy  1970s  . Knee arthroscopy  2006    left  knee  . Cystectomy      Pt reports removal of a "stone" from the right side of her throat as a child  . Tubal ligation  1996  . Foot sx      X2    Allergies  Allergen Reactions  .  Sulfa Antibiotics Other (See Comments)    Unknown reaction    History   Social History  . Marital Status: Married    Spouse Name: N/A    Number of Children: N/A  . Years of Education: N/A   Occupational History  . Not on file.   Social History Main Topics  . Smoking status: Current Every Day Smoker -- 1.00 packs/day for 39 years    Types: Cigarettes  . Smokeless tobacco: Never Used  . Alcohol Use: 4.2 oz/week    7 Cans of beer per week  . Drug Use: No  . Sexual Activity: Not on file   Other Topics Concern  . 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 Heart disease Mother   . Stroke Mother   . Hypertension Mother   . Diabetes Mother   . Diabetes Sister   . Kidney disease Brother   . Mental retardation Brother   . Cancer Cousin  breast  . Diabetes Sister   . Colon cancer Neg Hx   . Esophageal cancer Neg Hx   . Stomach cancer Neg Hx     BP 155/83  Pulse 71  Ht 5\' 1"  (1.549 m)  Wt 222 lb (100.699 kg)  BMI 41.97 kg/m2  LMP 10/10/1996  Review of Systems: See HPI above.    Objective:  Physical Exam:  Gen: NAD, obese.  Back/R hip: No gross deformity, scoliosis. TTP diffusely right side of low back to right lower quadrant, lateral right hip and groin.  No midline or bony TTP. FROM back without pain.  No pain with hip motion. Strength LEs 5/5 all muscle groups.  Pain with hip flexion.   1+ MSRs in patellar and achilles tendons, equal bilaterally. Negative SLRs. Sensation intact to light touch bilaterally. Negative logroll bilateral hips    Assessment & Plan:  1. Low back pain - radiating to hip, groin.  Given way pain came on and the extensive radiation, severity I'm not certain this is truly radiculopathic pain though with current workup excluding  other causes, will treat as such.  Medrol dose pack with percocet, valium as needed.  Follow up in 1 week.  She is already s/p appendectomy.  Possible she has a renal stone that was missed by CT.  Options if not improving include MR lumbar spine, CT with contrast, pelvic ultrasound.

## 2013-09-12 NOTE — Assessment & Plan Note (Signed)
radiating to hip, groin.  Given way pain came on and the extensive radiation, severity I'm not certain this is truly radiculopathic pain though with current workup excluding other causes, will treat as such.  Medrol dose pack with percocet, valium as needed.  Follow up in 1 week.  She is already s/p appendectomy.  Possible she has a renal stone that was missed by CT.  Options if not improving include MR lumbar spine, CT with contrast, pelvic ultrasound.

## 2013-09-16 ENCOUNTER — Telehealth: Payer: Self-pay | Admitting: *Deleted

## 2013-09-16 NOTE — Telephone Encounter (Signed)
Pt phoned in c/o pelvic pain; Malva Cogan, PA-C, recommended patient to make a follow up appointment.  She is going to call her insurance company to see if MRI will be covered.,

## 2013-09-18 ENCOUNTER — Ambulatory Visit (INDEPENDENT_AMBULATORY_CARE_PROVIDER_SITE_OTHER): Payer: BC Managed Care – PPO | Admitting: Family Medicine

## 2013-09-18 ENCOUNTER — Encounter: Payer: Self-pay | Admitting: Family Medicine

## 2013-09-18 VITALS — BP 146/87 | HR 85 | Ht 61.0 in | Wt 222.0 lb

## 2013-09-18 DIAGNOSIS — M545 Low back pain, unspecified: Secondary | ICD-10-CM

## 2013-09-23 ENCOUNTER — Encounter: Payer: Self-pay | Admitting: Family Medicine

## 2013-09-23 NOTE — Progress Notes (Signed)
Patient ID: Katie Jacobson, female   DOB: [birth year], 57 y.o.   MRN: [id number]  PCP: Lemont Fillers., NP  Subjective:   HPI: Patient is a 57 y.o. female here for right hip pain.  12/3: Patient reports about 1 1/2 weeks ago she started to develop rather sudden onset right flank pain radiating around to abdomen, right hip. No known injury. Felt similar to kidney stone she's had in the past. Denies radiation down leg. No numbness or tingling. No bowel/bladder dysfunction. No dysuria, hematuria, other complaints. No fevers, chill, sweats (has hot flashes). Tried muscle relaxant.  Has been given toradol injections. Workup in ED (labwork, CT without contrast) negative, was advised this was likely musculoskeletal pain. Given Rx for medrol dose pack at PCP's office today but not filled yet.  12/10: Patient reports she is 75% improved from last visit. Finished prednisone and pain medication. Icing. Feels a twitch in back at times but significantly better. No radiation into legs. Less radiation into abdomen and hip. No numbness/tingling. No bowel/bladder dysfunction.  Past Medical History  Diagnosis Date  . Hypertension   . Osteoporosis   . HTN (hypertension) 04/01/2011  . History of partial seizures 04/01/2011  . Mitral valve prolapse   . Personal history of kidney stones 05/2012    Current Outpatient Prescriptions on File Prior to Visit  Medication Sig Dispense Refill  . diazepam (VALIUM) 5 MG tablet Take 1 tablet (5 mg total) by mouth every 6 (six) hours as needed for muscle spasms.  40 tablet  0  . enalapril (VASOTEC) 20 MG tablet Take 20 mg by mouth at bedtime.      . methylPREDNIsolone (MEDROL DOSPACK) 4 MG tablet follow package directions  21 tablet  0  . metoprolol (TOPROL-XL) 50 MG 24 hr tablet Take 50 mg by mouth at bedtime.       . ondansetron (ZOFRAN) 4 MG tablet Take 1 tablet (4 mg total) by mouth every 6 (six) hours.  12 tablet  0  . oxyCODONE-acetaminophen  (PERCOCET) 7.5-325 MG per tablet Take 1 tablet by mouth every 6 (six) hours as needed for pain.  60 tablet  0   No current facility-administered medications on file prior to visit.    Past Surgical History  Procedure Laterality Date  . Appendectomy  1976  . Abdominal hysterectomy  1998    ovaries spared  . Tonsillectomy  1970s  . Knee arthroscopy  2006    left  knee  . Cystectomy      Pt reports removal of a "stone" from the right side of her throat as a child  . Tubal ligation  1996  . Foot sx      X2    Allergies  Allergen Reactions  . Sulfa Antibiotics Other (See Comments)    Unknown reaction    History   Social History  . Marital Status: Married    Spouse Name: N/A    Number of Children: N/A  . Years of Education: N/A   Occupational History  . Not on file.   Social History Main Topics  . Smoking status: Current Every Day Smoker -- 1.00 packs/day for 39 years    Types: Cigarettes  . Smokeless tobacco: Never Used  . Alcohol Use: 4.2 oz/week    7 Cans of beer per week  . Drug Use: No  . Sexual Activity: Not on file   Other Topics Concern  . 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   2 daughters- born 17 and 94                   Family History  Problem Relation Age of Onset  . Heart disease Mother   . Stroke Mother   . Hypertension Mother   . Diabetes Mother   . Diabetes Sister   . Kidney disease Brother   . Mental retardation Brother   . Cancer Cousin     breast  . Diabetes Sister   . Colon cancer Neg Hx   . Esophageal cancer Neg Hx   . Stomach cancer Neg Hx     BP 146/87  Pulse 85  Ht 5\' 1"  (1.549 m)  Wt 222 lb (100.699 kg)  BMI 41.97 kg/m2  LMP 10/10/1996  Review of Systems: See HPI above.    Objective:  Physical Exam:  Gen: NAD, obese.  Back/R hip: No gross deformity, scoliosis. Mild TTP right side low back, much more  tolerable.  No hip, groin tenderness. No midline or bony TTP. FROM back without pain.  No pain with hip motion. Strength LEs 5/5 all muscle groups. 1+ MSRs in patellar and achilles tendons, equal bilaterally. Negative SLRs. Sensation intact to light touch bilaterally. Negative logroll bilateral hips    Assessment & Plan:  1. Low back pain - >75% improved with medrol dose pack.  Would be consistent with relief seen from radiculopathy.  Discussed adding formal PT as we usually do at this stage - she would like to do HEP and consider this in the future.  Percocet, valium as needed.  She will follow-up as needed.

## 2013-09-23 NOTE — Assessment & Plan Note (Signed)
>  75% improved with medrol dose pack.  Would be consistent with relief seen from radiculopathy.  Discussed adding formal PT as we usually do at this stage - she would like to do HEP and consider this in the future.  Percocet, valium as needed.  She will follow-up as needed.

## 2014-02-06 ENCOUNTER — Other Ambulatory Visit: Payer: Self-pay | Admitting: *Deleted

## 2014-02-10 ENCOUNTER — Other Ambulatory Visit: Payer: Self-pay | Admitting: *Deleted

## 2014-02-10 MED ORDER — ENALAPRIL MALEATE 20 MG PO TABS: 20.0000 mg | ORAL_TABLET | Freq: Every day | ORAL | Status: DC

## 2014-02-10 MED ORDER — METOPROLOL SUCCINATE ER 50 MG PO TB24: 50.0000 mg | ORAL_TABLET | Freq: Every day | ORAL | Status: DC

## 2014-02-12 ENCOUNTER — Encounter: Payer: Self-pay | Admitting: *Deleted

## 2014-02-13 ENCOUNTER — Ambulatory Visit: Payer: BC Managed Care – PPO | Admitting: Cardiology

## 2014-03-12 ENCOUNTER — Telehealth: Payer: Self-pay | Admitting: Cardiology

## 2014-03-12 MED ORDER — METOPROLOL SUCCINATE ER 50 MG PO TB24: 50.0000 mg | ORAL_TABLET | Freq: Every day | ORAL | Status: DC

## 2014-03-12 NOTE — Telephone Encounter (Signed)
Rx was sent to pharmacy electronically. 

## 2014-03-12 NOTE — Telephone Encounter (Signed)
Need a refill on Metoprolol 50 mg #30.

## 2014-03-14 ENCOUNTER — Other Ambulatory Visit: Payer: Self-pay | Admitting: *Deleted

## 2014-03-14 ENCOUNTER — Encounter: Payer: Self-pay | Admitting: Cardiology

## 2014-03-14 MED ORDER — METOPROLOL SUCCINATE ER 50 MG PO TB24: 50.0000 mg | ORAL_TABLET | Freq: Every day | ORAL | Status: DC

## 2014-04-02 ENCOUNTER — Encounter: Payer: Self-pay | Admitting: Cardiology

## 2014-04-02 ENCOUNTER — Ambulatory Visit (INDEPENDENT_AMBULATORY_CARE_PROVIDER_SITE_OTHER): Payer: BC Managed Care – PPO | Admitting: Cardiology

## 2014-04-02 VITALS — BP 158/98 | HR 72 | Ht 62.0 in | Wt 215.6 lb

## 2014-04-02 DIAGNOSIS — R002 Palpitations: Secondary | ICD-10-CM

## 2014-04-02 DIAGNOSIS — I341 Nonrheumatic mitral (valve) prolapse: Secondary | ICD-10-CM

## 2014-04-02 DIAGNOSIS — E669 Obesity, unspecified: Secondary | ICD-10-CM

## 2014-04-02 DIAGNOSIS — F17200 Nicotine dependence, unspecified, uncomplicated: Secondary | ICD-10-CM

## 2014-04-02 DIAGNOSIS — Z720 Tobacco use: Secondary | ICD-10-CM

## 2014-04-02 DIAGNOSIS — E785 Hyperlipidemia, unspecified: Secondary | ICD-10-CM

## 2014-04-02 DIAGNOSIS — I059 Rheumatic mitral valve disease, unspecified: Secondary | ICD-10-CM

## 2014-04-02 DIAGNOSIS — I10 Essential (primary) hypertension: Secondary | ICD-10-CM

## 2014-04-02 NOTE — Patient Instructions (Signed)
Your physician recommends that you schedule a follow-up appointment in: 4 Months with PA Samara Snide, Next April with Dr Ellyn Hack

## 2014-04-06 ENCOUNTER — Telehealth: Payer: Self-pay | Admitting: Family

## 2014-04-06 ENCOUNTER — Encounter: Payer: Self-pay | Admitting: Cardiology

## 2014-04-06 DIAGNOSIS — R002 Palpitations: Secondary | ICD-10-CM | POA: Insufficient documentation

## 2014-04-06 DIAGNOSIS — E669 Obesity, unspecified: Secondary | ICD-10-CM | POA: Insufficient documentation

## 2014-04-06 MED ORDER — METOPROLOL SUCCINATE ER 50 MG PO TB24: 50.0000 mg | ORAL_TABLET | Freq: Every day | ORAL | Status: DC

## 2014-04-06 MED ORDER — ENALAPRIL MALEATE 20 MG PO TABS: 20.0000 mg | ORAL_TABLET | Freq: Every day | ORAL | Status: DC

## 2014-04-06 NOTE — Assessment & Plan Note (Signed)
>>  ASSESSMENT AND PLAN FOR MITRAL VALVE PROLAPSE WRITTEN ON 04/06/2014 12:49 AM BY HARDING, DAVID W, MD  I do not hear MR on exam & no obvious "click".  I suspect that this could simply be a misdiagnosis based upon 1990s Echocardiography.  Depending on her BP control at her f/u visit, we may want to determine presence of Hypertensive Heart Disease with a follow-up echo that could clarify this diagnosis.

## 2014-04-06 NOTE — Assessment & Plan Note (Signed)
I do not hear MR on exam & no obvious "click".  I suspect that this could simply be a misdiagnosis based upon 1990s Echocardiography.  Depending on her BP control at her f/u visit, we may want to determine presence of Hypertensive Heart Disease with a follow-up echo that could clarify this diagnosis.

## 2014-04-06 NOTE — Assessment & Plan Note (Signed)
She has lost ~7 lb in 6 months.  I congratulated her, but this is not quite enough to be at goal of 22 pounds by December.  Counseled her on the importance of continued exercise (at least moderate level activity) along with dietary modifications.

## 2014-04-06 NOTE — Assessment & Plan Note (Signed)
Followed by PCP.  Not on medical Rx - last labs: TC 207, HDL 72, LDL 122, TG 66.   Continue diet & exercise.  Consider NMR panel to determine benefit of HDL level.

## 2014-04-06 NOTE — Assessment & Plan Note (Signed)
4 minutes of counseling on importance of smoking cessation, especially in light of other cardiac risk factors. I possibly in the contemplative state.  Did not use Chantix, citing intolerance.  Says the is "cutting back" but not appreciably.

## 2014-04-06 NOTE — Telephone Encounter (Signed)
Could you please contact pt and have her book a fasting appointment?

## 2014-04-06 NOTE — Assessment & Plan Note (Signed)
Not adequately controlled today -- Add Chlothalidone 25 mg daily.   F/u with APP in ~4 months

## 2014-04-06 NOTE — Progress Notes (Signed)
PATIENT: Katie Jacobson MRN: [id number]  DOB: [birth year]   DOV:04/06/2014 PCP: Nance Pear., NP  Clinic Note: Chief Complaint  Patient presents with  . 14 month visit    no chest pain , no sob , no edema-- pcp follows labs -last 08/2013    HPI: Katie Jacobson is a 58 y.o.  female with a PMH below who presents today for > 1 year follow-up.  I last saw her in April 2014 - she was stable with no active symptoms.  She continues to smoke.  She is dealing with significant social stressors -- not the lest of which is being unemployed with lack of secure income.  Interval History: From a cardiac standpoint, she seems to be doing well.  Has made a conscious effort to loose weight, but has not been able to quite smoking due to her stress level.  She denies rest or exertional chest pressure/pain or dyspnea.  No headaches, blurred visions or recent seizure activity.   No PND, orthopnea or edema.  No palpitations, lightheadedness, dizziness, weakness or syncope/near syncope. No TIA/amaurosis fugax symptoms. No melena, hematochezia, hematuria, or epstaxis. No claudication.  Past Medical History  Diagnosis Date  . Heart palpitations   . Osteoporosis   . Essential hypertension 04/01/2011  . History of partial seizures 04/01/2011  . Mitral valve prolapse 1990  . Personal history of kidney stones 05/2012  . Obesity (BMI 35.0-39.9 without comorbidity)     Prior Cardiac Evaluation and Past Surgical History: Past Surgical History  Procedure Laterality Date  . Appendectomy  1976  . Abdominal hysterectomy  1998    ovaries spared  . Tonsillectomy  1970s  . Knee arthroscopy  2006    left  knee  . Cystectomy      Pt reports removal of a "stone" from the right side of her throat as a child  . Tubal ligation  1996  . Foot sx      X2  . Doppler echocardiography  03/27/1989    suggestive eidence of mitral valve proplapse miminal ; trace mitral regurgitation  . Nm myoview ltd  07/01/1996    EF 62%    Allergies  Allergen Reactions  . Sulfa Antibiotics Other (See Comments)    Unknown reaction    Current Outpatient Prescriptions  Medication Sig Dispense Refill  . enalapril (VASOTEC) 20 MG tablet Take 1 tablet (20 mg total) by mouth at bedtime.  30 tablet  11  . metoprolol succinate (TOPROL-XL) 50 MG 24 hr tablet Take 1 tablet (50 mg total) by mouth at bedtime.  30 tablet  11   No current facility-administered medications for this visit.    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  Has been doing part time work with [employer] -- had 6 active clients, but the most important one is her Daughter.   Regular exercise:  No   Caffeine Use: none; Current 1ppd smoker x > 39 yrs; EtOH ~4 oz/week    ROS: A comprehensive Review of Systems - Negative except stress level noted in HPI  PHYSICAL EXAM BP 158/98  Pulse 72  Ht 5\' 2"  (1.575 m)  Wt 215 lb 9.6 oz (97.796 kg)  BMI 39.42 kg/m2  LMP 10/10/1996 General appearance: alert, cooperative, appears stated age, no distress and moderately obese Neck: no adenopathy, no carotid bruit,  no JVD and supple, symmetrical, trachea midline Lungs: clear to auscultation bilaterally, normal percussion bilaterally and non-labored, good air movement Heart: normal apical impulse, regular rate and rhythm, S1, S2 normal and 1/2 c-d SEM @ base without radiation; no R/G Abdomen: soft, non-tender; bowel sounds normal; no masses,  no organomegaly Extremities: extremities normal, atraumatic, no cyanosis or edema, no edema, redness or tenderness in the calves or thighs and no ulcers, gangrene or trophic changes Pulses: 2+ and symmetric Neurologic: Grossly normal   Adult ECG Report  Rate: 72;  Rhythm: normal sinus rhythm, LAA; otherwise normal EKG.  Recent Labs: from Nov 2014 reviewed in [facility].    ASSESSMENT /  PLAN: Essential hypertension Not adequately controlled today -- Add Chlothalidone 25 mg daily.   F/u with APP in ~4 months  Hyperlipidemia LDL goal <130 Followed by PCP.  Not on medical Rx - last labs: TC 207, HDL 72, LDL 122, TG 66.   Continue diet & exercise.  Consider NMR panel to determine benefit of HDL level.  Heart palpitations Stable on current BB dose.  Does not really notice them anymore unless over-stressed.  Mitral valve prolapse I do not hear MR on exam & no obvious "click".  I suspect that this could simply be a misdiagnosis based upon 1990s Echocardiography.  Depending on her BP control at her f/u visit, we may want to determine presence of Hypertensive Heart Disease with a follow-up echo that could clarify this diagnosis.   Obesity (BMI 35.0-39.9 without comorbidity) She has lost ~7 lb in 6 months.  I congratulated her, but this is not quite enough to be at goal of 22 pounds by December.  Counseled her on the importance of continued exercise (at least moderate level activity) along with dietary modifications.    Tobacco abuse 4 minutes of counseling on importance of smoking cessation, especially in light of other cardiac risk factors. I possibly in the contemplative state.  Did not use Chantix, citing intolerance.  Says the is "cutting back" but not appreciably.    Orders Placed This Encounter  Procedures  . EKG 12-Lead   Meds ordered this encounter  Medications  . metoprolol succinate (TOPROL-XL) 50 MG 24 hr tablet    Sig: Take 1 tablet (50 mg total) by mouth at bedtime.    Dispense:  30 tablet    Refill:  11  . enalapril (VASOTEC) 20 MG tablet    Sig: Take 1 tablet (20 mg total) by mouth at bedtime.    Dispense:  30 tablet    Refill:  11    Followup: 4 months with APP, 1 year with Dr. Ellyn Hack.    DAVID W. Ellyn Hack, M.D., M.S. Interventional Cardiology [facility]

## 2014-04-06 NOTE — Assessment & Plan Note (Signed)
Stable on current BB dose.  Does not really notice them anymore unless over-stressed.

## 2014-04-07 NOTE — Telephone Encounter (Signed)
Pt was last seen by Inda Castle, NP in 08/2013 and advised follow up in 6 months. Please arrange fasting follow up.

## 2014-04-07 NOTE — Telephone Encounter (Signed)
Cardiology was recommending additional testing for her cholesterol.  She should schedule appointment when she is able.

## 2014-04-07 NOTE — Telephone Encounter (Signed)
Patient states that she is not working and cannot come in right now. She also says that she has a family member that is in the hospital. Patient would like to know what this appointment is for? She states that she is still smoking and is not going to quit anytime soon.

## 2014-04-07 NOTE — Telephone Encounter (Signed)
Looks like cardiology just refilled her BP medication for 1 year.  Please advise.

## 2014-04-08 NOTE — Telephone Encounter (Signed)
Pt returned my call. States she will call us back to schedule fasting follow up.

## 2014-04-08 NOTE — Telephone Encounter (Signed)
Left detailed message on cell# re: below instructions.

## 2014-04-09 ENCOUNTER — Encounter: Payer: Self-pay | Admitting: Family

## 2014-04-09 ENCOUNTER — Ambulatory Visit (INDEPENDENT_AMBULATORY_CARE_PROVIDER_SITE_OTHER): Payer: BC Managed Care – PPO | Admitting: Family

## 2014-04-09 VITALS — BP 142/84 | HR 63 | Temp 97.8°F | Resp 16 | Ht 60.5 in | Wt 217.0 lb

## 2014-04-09 DIAGNOSIS — I10 Essential (primary) hypertension: Secondary | ICD-10-CM

## 2014-04-09 DIAGNOSIS — E785 Hyperlipidemia, unspecified: Secondary | ICD-10-CM

## 2014-04-09 MED ORDER — CHLORTHALIDONE 25 MG PO TABS: 25.0000 mg | ORAL_TABLET | Freq: Every day | ORAL | Status: DC

## 2014-04-09 NOTE — Progress Notes (Signed)
Pre visit review using our clinic review tool, if applicable. No additional management support is needed unless otherwise documented below in the visit note. 

## 2014-04-09 NOTE — Patient Instructions (Signed)
Please complete lab work prior to leaving. Follow up in 1 month.  

## 2014-04-09 NOTE — Progress Notes (Signed)
Subjective:    Patient ID: Katie Jacobson, female    DOB: [birth year], 58 y.o.   MRN: [id number]  HPI  Katie Jacobson is a 58 yr old female who presents today for her 6 month follow up.  1) Osteoporosis- last bone density was 2012.  Will obtain follow up bone density.    2) HTN- Current BP meds include metoprolol and vasotec.   BP Readings from Last 3 Encounters:  04/09/14 142/84  04/02/14 158/98  09/18/13 146/87   3) Hyperlipidemia-Not currently maintained on statin.  Has lost some weight recently- I commended her on this. Wt Readings from Last 3 Encounters:  04/09/14 217 lb (98.431 kg)  04/02/14 215 lb 9.6 oz (97.796 kg)  09/18/13 222 lb (100.699 kg)     Lab Results  Component Value Date   CHOL 207* 09/02/2013   HDL 72 09/02/2013   LDLCALC 129* 04/09/2014   TRIG 61 04/09/2014   CHOLHDL 2.9 09/02/2013      Review of Systems See HPI  Past Medical History  Diagnosis Date  . Heart palpitations   . Osteoporosis   . Essential hypertension 04/01/2011  . History of partial seizures 04/01/2011  . Mitral valve prolapse 1990  . Personal history of kidney stones 05/2012  . Obesity (BMI 35.0-39.9 without comorbidity)     History   Social History  . Marital Status: Married    Spouse Name: N/A    Number of Children: N/A  . Years of Education: N/A   Occupational History  . Not on file.   Social History Main Topics  . Smoking status: Current Every Day Smoker -- 1.00 packs/day for 39 years    Types: Cigarettes  . Smokeless tobacco: Never Used  . Alcohol Use: 4.2 oz/week    7 Cans of beer per week  . Drug Use: No  . Sexual Activity: Not on file   Other Topics Concern  . 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  Has been doing part time work with [employer] -- had 6 active clients, but the most important one is her Daughter.   Regular exercise:  No   Caffeine Use: none; Current 1ppd smoker x > 39 yrs; EtOH ~4 oz/week    Past Surgical History  Procedure Laterality Date  . Appendectomy  1976  . Abdominal hysterectomy  1998    ovaries spared  . Tonsillectomy  1970s  . Knee arthroscopy  2006    left  knee  . Cystectomy      Pt reports removal of a "stone" from the right side of her throat as a child  . Tubal ligation  1996  . Foot sx      X2  . Doppler echocardiography  03/27/1989    suggestive eidence of mitral valve proplapse miminal ; trace mitral regurgitation  . Nm myoview ltd  07/01/1996    EF 62%    Family History  Problem Relation Age of Onset  . Heart disease Mother   . Stroke Mother   . Hypertension Mother   . Diabetes Mother   . Diabetes Sister   . Kidney disease Brother   . Mental retardation Brother   . Cancer Cousin     breast  . Diabetes Sister   . Colon cancer Neg Hx   . Esophageal cancer  Neg Hx   . Stomach cancer Neg Hx   . Heart attack Father     Allergies  Allergen Reactions  . Sulfa Antibiotics Other (See Comments)    Unknown reaction    Current Outpatient Prescriptions on File Prior to Visit  Medication Sig Dispense Refill  . enalapril (VASOTEC) 20 MG tablet Take 1 tablet (20 mg total) by mouth at bedtime.  30 tablet  11  . metoprolol succinate (TOPROL-XL) 50 MG 24 hr tablet Take 1 tablet (50 mg total) by mouth at bedtime.  30 tablet  11   No current facility-administered medications on file prior to visit.    BP 142/84  Pulse 63  Temp(Src) 97.8 F (36.6 C) (Oral)  Resp 16  Ht 5' 0.5" (1.537 m)  Wt 217 lb (98.431 kg)  BMI 41.67 kg/m2  SpO2 99%  LMP 10/10/1996       Objective:   Physical Exam  Constitutional: She is oriented to person, place, and time. She appears well-developed and well-nourished. No distress.  Cardiovascular: Normal rate and regular rhythm.   No murmur heard. Pulmonary/Chest: Effort normal and breath sounds normal. No respiratory  distress. She has no wheezes. She has no rales. She exhibits no tenderness.  Neurological: She is alert and oriented to person, place, and time.  Psychiatric: She has a normal mood and affect. Her behavior is normal. Judgment and thought content normal.          Assessment & Plan:

## 2014-04-10 ENCOUNTER — Telehealth: Payer: Self-pay | Admitting: Family

## 2014-04-10 DIAGNOSIS — M8580 Other specified disorders of bone density and structure, unspecified site: Secondary | ICD-10-CM

## 2014-04-10 LAB — NMR LIPOPROFILE WITH LIPIDS
CHOLESTEROL, TOTAL: 215 mg/dL — AB (ref <200)
HDL PARTICLE NUMBER: 40 umol/L (ref >=30.5)
HDL Size: 9.5 nm (ref >=9.2)
HDL-C: 74 mg/dL (ref >=40)
LDL (calc): 129 mg/dL — ABNORMAL HIGH (ref <100)
LDL PARTICLE NUMBER: 1731 nmol/L — AB (ref <1000)
LDL Size: 21.2 nm (ref >20.5)
LP-IR Score: <25 (ref <=45)
Large HDL-P: 9.8 umol/L (ref >=4.8)
Large VLDL-P: <0.8 nmol/L (ref <=2.7)
Small LDL Particle Number: 586 nmol/L — ABNORMAL HIGH (ref <=527)
TRIGLYCERIDES: 61 mg/dL (ref <150)

## 2014-04-10 NOTE — Telephone Encounter (Signed)
Cholesterol does show high LDL (bad cholesterol).  Her good cholesterol is high which is cardioprotective.  I would recommend continued low fat/low cholesterol diet, exercise, weight loss.

## 2014-04-11 NOTE — Telephone Encounter (Signed)
Also, I see that she is due for follow up bone density test.  I have pended below.

## 2014-04-11 NOTE — Assessment & Plan Note (Signed)
Uncontrolled. Add Chlorthalidone.

## 2014-04-11 NOTE — Assessment & Plan Note (Signed)
Obtain NMR lipoprofile at cardiology's recommendation.

## 2014-04-18 NOTE — Telephone Encounter (Signed)
Left message for pt to return my call.

## 2014-04-21 NOTE — Telephone Encounter (Signed)
Pt left message returning my call on 04/18/14 at 4:47pm. Attempted to reach pt and left detailed message re: below instructions and to call if any questions.

## 2014-04-30 ENCOUNTER — Inpatient Hospital Stay: Admission: RE | Admit: 2014-04-30 | Payer: BC Managed Care – PPO | Source: Ambulatory Visit

## 2014-05-12 ENCOUNTER — Ambulatory Visit: Payer: BC Managed Care – PPO | Admitting: Family

## 2014-06-26 ENCOUNTER — Telehealth: Payer: Self-pay | Admitting: Physician Assistant

## 2014-06-26 ENCOUNTER — Encounter: Payer: Self-pay | Admitting: Physician Assistant

## 2014-06-27 NOTE — Telephone Encounter (Signed)
Close encounter 

## 2014-07-11 ENCOUNTER — Ambulatory Visit: Payer: BC Managed Care – PPO | Admitting: Physician Assistant

## 2014-07-16 ENCOUNTER — Encounter: Payer: Self-pay | Admitting: Physician Assistant

## 2014-07-16 ENCOUNTER — Ambulatory Visit (INDEPENDENT_AMBULATORY_CARE_PROVIDER_SITE_OTHER): Payer: BC Managed Care – PPO | Admitting: Physician Assistant

## 2014-07-16 VITALS — BP 122/80 | HR 71 | Ht 61.0 in | Wt 211.4 lb

## 2014-07-16 DIAGNOSIS — I341 Nonrheumatic mitral (valve) prolapse: Secondary | ICD-10-CM

## 2014-07-16 DIAGNOSIS — Z720 Tobacco use: Secondary | ICD-10-CM

## 2014-07-16 DIAGNOSIS — I10 Essential (primary) hypertension: Secondary | ICD-10-CM

## 2014-07-16 DIAGNOSIS — E669 Obesity, unspecified: Secondary | ICD-10-CM

## 2014-07-16 DIAGNOSIS — E785 Hyperlipidemia, unspecified: Secondary | ICD-10-CM

## 2014-07-16 NOTE — Assessment & Plan Note (Signed)
>>  ASSESSMENT AND PLAN FOR MITRAL VALVE PROLAPSE WRITTEN ON 07/16/2014  3:05 PM BY HAGER, BRYAN W, PA-C  No MM on Exam

## 2014-07-16 NOTE — Assessment & Plan Note (Signed)
Will refer to registered dietician for help.

## 2014-07-16 NOTE — Assessment & Plan Note (Signed)
She has tried the vapor cigarette up until it broke.  She continues to try.

## 2014-07-16 NOTE — Assessment & Plan Note (Signed)
No MM on Exam

## 2014-07-16 NOTE — Assessment & Plan Note (Signed)
BP is well controlled.  She is only taking the chlorthalidone every other day.  On ACE and toprol.  No changes.

## 2014-07-16 NOTE — Progress Notes (Signed)
Date:  07/16/2014   ID:  Katie Jacobson, DOB 05-24-1956, MRN [id number]  PCP:  Nance Pear., NP  Primary Cardiologist:  Ellyn Hack     History of Present Illness: Katie Jacobson is a 58 y.o. female Katie Jacobson is a 58 y.o. female with a PMH below who presents today 4 month follow up.  She reports doing much better.  She is under less stress now then she was four months ago.    Interval History: From a cardiac standpoint, she seems to be doing well. Has made a conscious effort to loose weight, and is down 6 pounds.  She has tried the vapor cigarette and it was working until it broke.   The patient currently denies nausea, vomiting, fever, chest pain, shortness of breath, orthopnea, dizziness, PND, cough, congestion, abdominal pain, hematochezia, melena, lower extremity edema, claudication.  Wt Readings from Last 3 Encounters:  07/16/14 211 lb 6.4 oz (95.89 kg)  04/09/14 217 lb (98.431 kg)  04/02/14 215 lb 9.6 oz (97.796 kg)     Past Medical History  Diagnosis Date  . Heart palpitations   . Osteoporosis   . Essential hypertension 04/01/2011  . History of partial seizures 04/01/2011  . Mitral valve prolapse 1990  . Personal history of kidney stones 05/2012  . Obesity (BMI 35.0-39.9 without comorbidity)     Current Outpatient Prescriptions  Medication Sig Dispense Refill  . chlorthalidone (HYGROTON) 25 MG tablet Take 25 mg by mouth every other day.      . enalapril (VASOTEC) 20 MG tablet Take 1 tablet (20 mg total) by mouth at bedtime.  30 tablet  11  . fluticasone (FLONASE) 50 MCG/ACT nasal spray Place 1 spray into both nostrils as needed.      . metoprolol succinate (TOPROL-XL) 50 MG 24 hr tablet Take 1 tablet (50 mg total) by mouth at bedtime.  30 tablet  11   No current facility-administered medications for this visit.    Allergies:    Allergies  Allergen Reactions  . Sulfa Antibiotics Other (See Comments)    Unknown reaction    Social History:  The  patient  reports that she has been smoking Cigarettes.  She has a 39 pack-year smoking history. She has never used smokeless tobacco. She reports that she drinks about 4.2 ounces of alcohol per week. She reports that she does not use illicit drugs.   Family history:   Family History  Problem Relation Age of Onset  . Heart disease Mother   . Stroke Mother   . Hypertension Mother   . Diabetes Mother   . Diabetes Sister   . Kidney disease Brother   . Mental retardation Brother   . Cancer Cousin     breast  . Diabetes Sister   . Colon cancer Neg Hx   . Esophageal cancer Neg Hx   . Stomach cancer Neg Hx   . Heart attack Father     ROS:  Please see the history of present illness.  All other systems reviewed and negative.   PHYSICAL EXAM: VS:  BP 122/80  Pulse 71  Ht 5\' 1"  (1.549 m)  Wt 211 lb 6.4 oz (95.89 kg)  BMI 39.96 kg/m2  LMP 10/10/1996 Obese, well developed, in no acute distress HEENT: Pupils are equal round react to light accommodation extraocular movements are intact.  Neck: no JVDNo cervical lymphadenopathy. Cardiac: Regular rate and rhythm without murmurs rubs or gallops. Lungs:  clear to auscultation bilaterally, no  wheezing, rhonchi or rales Abd: soft, nontender, positive bowel sounds all quadrants, no hepatosplenomegaly Ext: no lower extremity edema.  2+ radial and dorsalis pedis pulses. Skin: warm and dry Neuro:  Grossly normal  EKG:  NSR 71 BPM,   ASSESSMENT AND PLAN:  Problem List Items Addressed This Visit   Essential hypertension - Primary (Chronic)     BP is well controlled.  She is only taking the chlorthalidone every other day.  On ACE and toprol.  No changes.     Relevant Medications      chlorthalidone (HYGROTON) 25 MG tablet   Hyperlipidemia LDL goal <130 (Chronic)      Lipid Panel     Component Value Date/Time   CHOL 207* 09/02/2013 1128   TRIG 61 04/09/2014 1204   TRIG 66 09/02/2013 1128   HDL 72 09/02/2013 1128   CHOLHDL 2.9 09/02/2013  1128   VLDL 13 09/02/2013 1128   LDLCALC 129* 04/09/2014 1204   LDLCALC 122* 09/02/2013 1128   Overall not a bad lipid panel     Relevant Medications      chlorthalidone (HYGROTON) 25 MG tablet   Mitral valve prolapse (Chronic)     No MM on Exam    Relevant Medications      chlorthalidone (HYGROTON) 25 MG tablet   Obesity (BMI 35.0-39.9 without comorbidity) (Chronic)     Will refer to registered dietician for help.     Tobacco abuse (Chronic)     She has tried the vapor cigarette up until it broke.  She continues to try.

## 2014-07-16 NOTE — Assessment & Plan Note (Signed)
Lipid Panel     Component Value Date/Time   CHOL 207* 09/02/2013 1128   TRIG 61 04/09/2014 1204   TRIG 66 09/02/2013 1128   HDL 72 09/02/2013 1128   CHOLHDL 2.9 09/02/2013 1128   VLDL 13 09/02/2013 1128   LDLCALC 129* 04/09/2014 1204   LDLCALC 122* 09/02/2013 1128   Overall not a bad lipid panel

## 2014-07-16 NOTE — Patient Instructions (Signed)
Your physician recommends that you schedule a follow-up appointment in: 1 Year with Dr Ellyn Hack  Your physician has recommended you make the following change in your medication: Decrease Chlorthalidone (Hygroton) 25 mg to Every Other Day

## 2014-08-08 ENCOUNTER — Other Ambulatory Visit: Payer: Self-pay | Admitting: Family

## 2014-08-11 ENCOUNTER — Encounter: Payer: Self-pay | Admitting: Physician Assistant

## 2014-08-11 NOTE — Telephone Encounter (Signed)
Pt last seen by PCP on 04/09/14 and started on chlorthalidone. Pt was advised to follow up in 1 month and she is past due.  30 day supply sent to pharmacy. Please call pt to arrange f/u before next refill due.

## 2014-08-27 NOTE — Telephone Encounter (Signed)
Made pt aware that she needed to make a follow up appt, pt stated she would call back tomorrow

## 2014-09-08 ENCOUNTER — Other Ambulatory Visit: Payer: Self-pay | Admitting: Family

## 2014-09-08 NOTE — Telephone Encounter (Signed)
Received hygroton refill request from pharmacy. Pt was advised on 08/11/14 that she was past due for follow up and would need to be seen before further refills were given. Pt declined appt at that time and stated she would call us back. Pt has no future appts scheduled and was last seen in July and advised 1 month follow up at that time. Refill was denied.

## 2014-09-10 ENCOUNTER — Telehealth: Payer: Self-pay | Admitting: Family

## 2014-09-10 ENCOUNTER — Telehealth: Payer: Self-pay | Admitting: Cardiology

## 2014-09-10 NOTE — Telephone Encounter (Signed)
Caller name: [place], [place] Relation to pt: self  Call back number: [phone]   Reason for call:  Pt wanted to inform you that she is follow up with cardiologist

## 2014-09-10 NOTE — Telephone Encounter (Signed)
Pt wants to know if Dr Ellyn Hack was the doctor who started her on Chlorthalidone?

## 2014-09-10 NOTE — Telephone Encounter (Signed)
Katie Jacobson--  Katie Jacobson told me that pt was upset with our message to her that she would have to be seen for further refills of her blood pressure medication. We last saw pt in July for her BP and advised her to follow up in 1 month as it was uncontrolled. Apparently, pt started seeing cardiology during this time and states they have been managing her BP meds. If pt is seeing cardiology for BP management, when do we need to see pt again?

## 2014-09-10 NOTE — Telephone Encounter (Signed)
Please clarify- ? Needs follow up, ? Had follow up.  Message is not clear.

## 2014-09-10 NOTE — Telephone Encounter (Signed)
Returned call to patient regarding question about Rx prescriber. Pt states has been followed by PCP recently and also had appt with B. Hager PA here, Samara Snide had prescribed Chlorthalidone to take every other day. States spoke to nurse at PCP who, per pt, stated pt would need to follow up with them at appropriate intervals in order to receive prescriptions. Pt was confused about interval of appropriate follow up and necessity r/t Rx refills. Clarified that PCP appts would need to be discussed with PCP but that we would be able to provide refills for Rx prescribed here. Other than this, pt solely needed clarification that we prescribed Chlorthalidone. Pt to F/U with Dr. Ellyn Hack in 6 months.

## 2014-09-11 NOTE — Telephone Encounter (Signed)
If patient is seeing cardiology for BP management then I only need to see her once a year. However, if we are going to be writing her rx's for BP meds then I need to see her at least every 6 months.

## 2014-09-12 NOTE — Telephone Encounter (Signed)
Notified pt and she states she continues to see cardiology for BP management and is aware to follow up with PCP for annual CPE and will be due in July.

## 2014-10-16 ENCOUNTER — Other Ambulatory Visit: Payer: Self-pay | Admitting: Obstetrics and Gynecology

## 2014-10-17 LAB — CYTOLOGY - PAP

## 2014-10-20 ENCOUNTER — Other Ambulatory Visit: Payer: Self-pay | Admitting: Obstetrics and Gynecology

## 2014-10-20 DIAGNOSIS — R928 Other abnormal and inconclusive findings on diagnostic imaging of breast: Secondary | ICD-10-CM

## 2014-10-24 ENCOUNTER — Ambulatory Visit
Admission: RE | Admit: 2014-10-24 | Discharge: 2014-10-24 | Disposition: A | Discharge status: Home / Self Care | Payer: BLUE CROSS/BLUE SHIELD | Source: Ambulatory Visit | Attending: Obstetrics and Gynecology | Admitting: Obstetrics and Gynecology

## 2014-10-24 ENCOUNTER — Other Ambulatory Visit: Payer: Self-pay | Admitting: Obstetrics and Gynecology

## 2014-10-24 DIAGNOSIS — N6310 Unspecified lump in the right breast, unspecified quadrant: Secondary | ICD-10-CM

## 2014-10-24 DIAGNOSIS — R928 Other abnormal and inconclusive findings on diagnostic imaging of breast: Secondary | ICD-10-CM

## 2014-10-28 ENCOUNTER — Other Ambulatory Visit: Payer: Self-pay | Admitting: Obstetrics and Gynecology

## 2014-10-28 DIAGNOSIS — N6310 Unspecified lump in the right breast, unspecified quadrant: Secondary | ICD-10-CM

## 2014-10-31 ENCOUNTER — Other Ambulatory Visit: Payer: BLUE CROSS/BLUE SHIELD

## 2014-11-12 ENCOUNTER — Ambulatory Visit
Admission: RE | Admit: 2014-11-12 | Discharge: 2014-11-12 | Disposition: A | Discharge status: Home / Self Care | Payer: BLUE CROSS/BLUE SHIELD | Source: Ambulatory Visit | Attending: Obstetrics and Gynecology | Admitting: Obstetrics and Gynecology

## 2014-11-12 DIAGNOSIS — N6310 Unspecified lump in the right breast, unspecified quadrant: Secondary | ICD-10-CM

## 2014-11-14 ENCOUNTER — Telehealth: Payer: Self-pay | Admitting: *Deleted

## 2014-11-14 ENCOUNTER — Encounter: Payer: Self-pay | Admitting: *Deleted

## 2014-11-14 DIAGNOSIS — C50411 Malignant neoplasm of upper-outer quadrant of right female breast: Secondary | ICD-10-CM

## 2014-11-14 DIAGNOSIS — Z171 Estrogen receptor negative status [ER-]: Secondary | ICD-10-CM | POA: Insufficient documentation

## 2014-11-14 HISTORY — DX: Malignant neoplasm of upper-outer quadrant of right female breast: C50.411

## 2014-11-14 NOTE — Telephone Encounter (Signed)
Confirmed [facility] for 11/19/14 at 1230 .  Instructions and contact information given.

## 2014-11-19 ENCOUNTER — Other Ambulatory Visit (HOSPITAL_BASED_OUTPATIENT_CLINIC_OR_DEPARTMENT_OTHER): Payer: BLUE CROSS/BLUE SHIELD

## 2014-11-19 ENCOUNTER — Encounter: Payer: Self-pay | Admitting: Oncology

## 2014-11-19 ENCOUNTER — Ambulatory Visit: Payer: BLUE CROSS/BLUE SHIELD

## 2014-11-19 ENCOUNTER — Other Ambulatory Visit (INDEPENDENT_AMBULATORY_CARE_PROVIDER_SITE_OTHER): Payer: Self-pay | Admitting: General Surgery

## 2014-11-19 ENCOUNTER — Encounter: Payer: Self-pay | Admitting: Physical Therapy

## 2014-11-19 ENCOUNTER — Ambulatory Visit: Payer: BLUE CROSS/BLUE SHIELD | Attending: General Surgery | Admitting: Physical Therapy

## 2014-11-19 ENCOUNTER — Telehealth: Payer: Self-pay | Admitting: Oncology

## 2014-11-19 ENCOUNTER — Ambulatory Visit (HOSPITAL_BASED_OUTPATIENT_CLINIC_OR_DEPARTMENT_OTHER): Payer: BLUE CROSS/BLUE SHIELD | Admitting: Oncology

## 2014-11-19 ENCOUNTER — Ambulatory Visit
Admission: RE | Admit: 2014-11-19 | Discharge: 2014-11-19 | Disposition: A | Discharge status: Home / Self Care | Payer: BLUE CROSS/BLUE SHIELD | Source: Ambulatory Visit | Attending: Radiation Oncology | Admitting: Radiation Oncology

## 2014-11-19 VITALS — BP 140/71 | HR 78 | Temp 98.0°F | Resp 18 | Ht 61.0 in | Wt 215.7 lb

## 2014-11-19 DIAGNOSIS — C50411 Malignant neoplasm of upper-outer quadrant of right female breast: Secondary | ICD-10-CM

## 2014-11-19 DIAGNOSIS — R293 Abnormal posture: Secondary | ICD-10-CM | POA: Insufficient documentation

## 2014-11-19 DIAGNOSIS — Z9189 Other specified personal risk factors, not elsewhere classified: Secondary | ICD-10-CM | POA: Insufficient documentation

## 2014-11-19 DIAGNOSIS — C50911 Malignant neoplasm of unspecified site of right female breast: Secondary | ICD-10-CM | POA: Diagnosis not present

## 2014-11-19 DIAGNOSIS — Z8669 Personal history of other diseases of the nervous system and sense organs: Secondary | ICD-10-CM

## 2014-11-19 DIAGNOSIS — Z720 Tobacco use: Secondary | ICD-10-CM

## 2014-11-19 DIAGNOSIS — I10 Essential (primary) hypertension: Secondary | ICD-10-CM

## 2014-11-19 LAB — CBC WITH DIFFERENTIAL/PLATELET
BASO%: 0.8 % (ref 0.0–2.0)
BASOS ABS: 0.1 10*3/uL (ref 0.0–0.1)
EOS%: 1 % (ref 0.0–7.0)
Eosinophils Absolute: 0.1 10*3/uL (ref 0.0–0.5)
HCT: 42.9 % (ref 34.8–46.6)
HEMOGLOBIN: 13.9 g/dL (ref 11.6–15.9)
LYMPH#: 2.3 10*3/uL (ref 0.9–3.3)
LYMPH%: 33.5 % (ref 14.0–49.7)
MCH: 30.1 pg (ref 25.1–34.0)
MCHC: 32.5 g/dL (ref 31.5–36.0)
MCV: 92.5 fL (ref 79.5–101.0)
MONO#: 0.5 10*3/uL (ref 0.1–0.9)
MONO%: 7.5 % (ref 0.0–14.0)
NEUT#: 3.9 10*3/uL (ref 1.5–6.5)
NEUT%: 57.2 % (ref 38.4–76.8)
Platelets: 220 10*3/uL (ref 145–400)
RBC: 4.64 10*6/uL (ref 3.70–5.45)
RDW: 15 % — ABNORMAL HIGH (ref 11.2–14.5)
WBC: 6.8 10*3/uL (ref 3.9–10.3)

## 2014-11-19 LAB — COMPREHENSIVE METABOLIC PANEL (CC13)
ALBUMIN: 3.5 g/dL (ref 3.5–5.0)
ALK PHOS: 73 U/L (ref 40–150)
ALT: 13 U/L (ref 0–55)
AST: 15 U/L (ref 5–34)
Anion Gap: 12 mEq/L — ABNORMAL HIGH (ref 3–11)
BUN: 15.6 mg/dL (ref 7.0–26.0)
CALCIUM: 9.3 mg/dL (ref 8.4–10.4)
CO2: 28 meq/L (ref 22–29)
Chloride: 104 mEq/L (ref 98–109)
Creatinine: 0.9 mg/dL (ref 0.6–1.1)
EGFR: 81 mL/min/{1.73_m2} — AB (ref >90)
GLUCOSE: 82 mg/dL (ref 70–140)
Potassium: 3.4 mEq/L — ABNORMAL LOW (ref 3.5–5.1)
Sodium: 143 mEq/L (ref 136–145)
Total Bilirubin: 0.43 mg/dL (ref 0.20–1.20)
Total Protein: 7.1 g/dL (ref 6.4–8.3)

## 2014-11-19 NOTE — Patient Instructions (Signed)

## 2014-11-19 NOTE — Progress Notes (Signed)
Checked in new pt with no financial concerns prior to seeing the dr. Informed pt if chemo is part of her treatment I will call her ins to see if Josem Kaufmann is req and will obtain it if it is as well as contact foundations that offer copay assistance for chemo if needed. She has my card to for any billing questions or concerns.

## 2014-11-19 NOTE — Telephone Encounter (Signed)
per pof to sch pt appt-pt to get updated copy @ 2/11 appt-sent MW email to sch trmt

## 2014-11-19 NOTE — Progress Notes (Signed)
Katie Jacobson is a very pleasant 59 y.o. female from [place], [place] with newly diagnosed grade 2 invasive ductal carcinoma of the right breast.  Biopsy results revealed the tumor's hormone status as ER negative, PR negative, and HER2/neu is pending at this time. Ki67 is 20%.  She presents today with her husband and daughter to the [facility]) for treatment consideration and recommendations from the breast surgeon, radiation oncologist, and medical oncologist.     I briefly met with Katie Jacobson and her family during her [facility] visit today. We discussed the purpose of the Survivorship Clinic, which will include monitoring for recurrence, coordinating completion of age and gender-appropriate cancer screenings, promotion of overall wellness, as well as managing potential late/long-term side effects of anti-cancer treatments.    As of today, the treatment plan for Katie Jacobson will likely include surgery, chemotherapy, and radiation therapy.  She will meet with the Genetics Counselor due to her family history of breast cancer. The intent of treatment for Katie Jacobson is cure, therefore she will be eligible for the Survivorship Clinic upon her completion of treatment.  Her survivorship care plan (SCP) document will be drafted and updated throughout the course of her treatment trajectory. She will receive the SCP in an office visit with myself in the Survivorship Clinic once she has completed treatment.   Katie Jacobson was encouraged to ask questions and all questions were answered to her satisfaction.  She was given my business card and encouraged to contact me with any concerns regarding survivorship.  I look forward to participating in her care.   Mike Craze, NP [place] [phone]

## 2014-11-19 NOTE — Progress Notes (Signed)
[place]  [phone] Fax:([phone]) [phone]     ID: Katie Jacobson DOB: [birth year]  MR#: [id number]  VPX#:[id number]  Patient Care Team: Debbrah Alar, NP as PCP - General (Internal Medicine) Gus Height, MD (Obstetrics and Gynecology) Rolm Bookbinder, MD as Consulting Physician (General Surgery) Chauncey Cruel, MD as Consulting Physician (Oncology) Eppie Gibson, MD as Attending Physician (Radiation Oncology) Roselee Culver, RN as Registered Nurse [facility], RN as Registered Nurse OTHER MD:  CHIEF COMPLAINT: Triple negative breast cancer  CURRENT TREATMENT: Neoadjuvant chemotherapy   BREAST CANCER HISTORY: The patient herself palpated a mass in her right breast there is she brought it to her gynecologist, Dr. Alan Ripper attention and he obtained screening mammography which suggested an abnormality in the right breast. On 10/24/2014 the patient underwent right diagnostic mammography and ultrasonography at the breast Center. The breast density was category B. There was an ill-defined mass in the upper outer quadrant of the right breast which was palpable on exam. By ultrasound this was irregular and hypoechoic measuring 1.2 cm. Evaluation of the right axilla was negative.  Biopsy of the mass in question showed an invasive ductal carcinoma, grade 2 or 3, triple negative, with an MIB-1 of 19%.  Katie Jacobson case was presented at the multidisciplinary breast cancer conference 11/19/2014, where was suggested she would benefit from genetics testing. Since this would mandate a significant delay in her final surgery, neoadjuvant chemotherapy was indicated.  The patient's subsequent history is as detailed below  INTERVAL HISTORY: Katie Jacobson was evaluated in the multidisciplinary breast cancer clinic 11/19/2014, accompanied by her daughter Katie and the patient's husband Ron.  REVIEW OF SYSTEMS: Aside from the mass itself, there were no specific  symptoms leading to the original mammogram, which was routinely scheduled. The patient denies unusual headaches, visual changes, nausea, vomiting, stiff neck, dizziness, or gait imbalance. There has been no cough, phlegm production, or pleurisy, no chest pain or pressure, and no change in bowel or bladder habits. The patient denies fever, rash, bleeding, unexplained fatigue or unexplained weight loss. The patient has a history of palpitations, not currently active. A detailed review of systems was otherwise entirely negative.  PAST MEDICAL HISTORY: Past Medical History  Diagnosis Date  . Heart palpitations   . Osteoporosis   . Essential hypertension 04/01/2011  . History of partial seizures 04/01/2011  . Mitral valve prolapse 1990  . Personal history of kidney stones 05/2012  . Obesity (BMI 35.0-39.9 without comorbidity)   . Breast cancer of upper-outer quadrant of right female breast 11/14/2014    PAST SURGICAL HISTORY: Past Surgical History  Procedure Laterality Date  . Appendectomy  1976  . Abdominal hysterectomy  1998    ovaries spared  . Tonsillectomy  1970s  . Knee arthroscopy  2006    left  knee  . Cystectomy      Pt reports removal of a "stone" from the right side of her throat as a child  . Tubal ligation  1996  . Foot sx      X2  . Doppler echocardiography  03/27/1989    suggestive eidence of mitral valve proplapse miminal ; trace mitral regurgitation  . Nm myoview ltd  07/01/1996    EF 62%    FAMILY HISTORY Family History  Problem Relation Age of Onset  . Heart disease Mother   . Stroke Mother   . Hypertension Mother   . Diabetes Mother   . Diabetes Sister   . Kidney disease  Brother   . Mental retardation Brother   . Cancer Cousin     breast  . Diabetes Sister   . Colon cancer Neg Hx   . Esophageal cancer Neg Hx   . Stomach cancer Neg Hx   . Heart attack Father    the patient knows little about her father. Her mother died at at the age of 27 following a  stroke. The patient had 2 brothers, 2 sisters. The only history of breast or ovarian cancer in the family is a first cousin on the mother's side diagnosed with breast cancer at age 71  GYNECOLOGIC HISTORY:  Patient's last menstrual period was 10/10/1996. Menarche age 32, first live birth age 42. The patient is GX P2. She status post simple hysterectomy without salpingo-oophorectomy. She did not take hormone replacement.  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 The patient's husband, Jori Moll "Ron" Poythress works for [employer] in the pattern shop. He also works as a Dispensing optician. Daughter Jennice Renegar lives in [place] and works in Programmer, applications. Daughter Katie Jacobson lives in [place] and works at a [employer]. 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    ADVANCED DIRECTIVES: Not in place   HEALTH MAINTENANCE: History  Substance Use Topics  . Smoking status: Current Every Day Smoker -- 1.00 packs/day for 39 years    Types: Cigarettes  . Smokeless tobacco: Never Used  . Alcohol Use: 4.2 oz/week    7 Cans of beer per week     Colonoscopy:  PAP:  Bone density:  Lipid panel:  Allergies  Allergen Reactions  . Sulfa Antibiotics Other (See Comments)    Unknown reaction    Current Outpatient Prescriptions  Medication Sig Dispense Refill  . chlorthalidone (HYGROTON) 25 MG tablet Take 25 mg by mouth every other day.    . chlorthalidone (HYGROTON) 25 MG tablet TAKE 1 TABLET BY MOUTH EVERY DAY 30 tablet 0  . enalapril (VASOTEC) 20 MG tablet Take 1 tablet (20 mg total) by mouth at bedtime. 30 tablet 11  . fluticasone (FLONASE) 50 MCG/ACT nasal spray Place 1 spray into both nostrils as needed.    . metoprolol succinate (TOPROL-XL) 50 MG 24 hr tablet Take 1 tablet (50 mg total) by mouth at bedtime. 30 tablet 11   No current facility-administered medications for this visit.    OBJECTIVE: Middle-aged African-American  woman who appears stated age 14 Vitals:   11/19/14 1257  BP: 140/71  Pulse: 78  Temp: 98 F (36.7 C)  Resp: 18     Body mass index is 40.78 kg/(m^2).    ECOG FS:0 - Asymptomatic  Ocular: Sclerae unicteric, pupils equal, round and reactive  Ear-nose-throat: Oropharynx clear and moist Lymphatic: No cervical or supraclavicular adenopathy Lungs no rales or rhonchi, good excursion bilaterally Heart regular rate and rhythm, no murmur appreciated Abd soft, nontender, positive bowel sounds MSK no focal spinal tenderness, no joint edema Neuro: non-focal, well-oriented, appropriate affect Breasts: The right breast is status post recent biopsy. There is an easily palpable mass in the upper outer quadrant measuring perhaps 1/2 cm. There are no skin or nipple lesions of concern. The right axilla is benign per the left breast is unremarkable.   LAB RESULTS:  CMP     Component Value Date/Time   NA 143 11/19/2014 1241   NA 137 09/08/2013 1120   K 3.4* 11/19/2014 1241   K 3.6 09/08/2013  1120   CL 103 09/08/2013 1120   CO2 28 11/19/2014 1241   CO2 21 09/08/2013 1120   GLUCOSE 82 11/19/2014 1241   GLUCOSE 89 09/08/2013 1120   BUN 15.6 11/19/2014 1241   BUN 9 09/08/2013 1120   CREATININE 0.9 11/19/2014 1241   CREATININE 0.85 09/08/2013 1120   CREATININE 0.93 09/02/2013 1128   CALCIUM 9.3 11/19/2014 1241   CALCIUM 9.4 09/08/2013 1120   PROT 7.1 11/19/2014 1241   PROT 7.0 09/08/2013 1120   ALBUMIN 3.5 11/19/2014 1241   ALBUMIN 3.4* 09/08/2013 1120   AST 15 11/19/2014 1241   AST 15 09/08/2013 1120   ALT 13 11/19/2014 1241   ALT 12 09/08/2013 1120   ALKPHOS 73 11/19/2014 1241   ALKPHOS 80 09/08/2013 1120   BILITOT 0.43 11/19/2014 1241   BILITOT 0.5 09/08/2013 1120   GFRNONAA 76* 09/08/2013 1120   GFRNONAA 70 09/02/2013 1128   GFRAA 88* 09/08/2013 1120   GFRAA 81 09/02/2013 1128    INo results found for: SPEP, UPEP  Lab Results  Component Value Date   WBC 6.8 11/19/2014    NEUTROABS 3.9 11/19/2014   HGB 13.9 11/19/2014   HCT 42.9 11/19/2014   MCV 92.5 11/19/2014   PLT 220 11/19/2014      Chemistry      Component Value Date/Time   NA 143 11/19/2014 1241   NA 137 09/08/2013 1120   K 3.4* 11/19/2014 1241   K 3.6 09/08/2013 1120   CL 103 09/08/2013 1120   CO2 28 11/19/2014 1241   CO2 21 09/08/2013 1120   BUN 15.6 11/19/2014 1241   BUN 9 09/08/2013 1120   CREATININE 0.9 11/19/2014 1241   CREATININE 0.85 09/08/2013 1120   CREATININE 0.93 09/02/2013 1128      Component Value Date/Time   CALCIUM 9.3 11/19/2014 1241   CALCIUM 9.4 09/08/2013 1120   ALKPHOS 73 11/19/2014 1241   ALKPHOS 80 09/08/2013 1120   AST 15 11/19/2014 1241   AST 15 09/08/2013 1120   ALT 13 11/19/2014 1241   ALT 12 09/08/2013 1120   BILITOT 0.43 11/19/2014 1241   BILITOT 0.5 09/08/2013 1120       No results found for: LABCA2  No components found for: LABCA125  No results for input(s): INR in the last 168 hours.  Urinalysis    Component Value Date/Time   COLORURINE YELLOW 09/08/2013 [place] 09/08/2013 1258   LABSPEC 1.019 09/08/2013 1258   PHURINE 8.0 09/08/2013 1258   GLUCOSEU NEGATIVE 09/08/2013 1258   HGBUR TRACE* 09/08/2013 1258   BILIRUBINUR NEGATIVE 09/08/2013 1258   KETONESUR 40* 09/08/2013 1258   PROTEINUR NEGATIVE 09/08/2013 1258   UROBILINOGEN 0.2 09/08/2013 1258   NITRITE NEGATIVE 09/08/2013 1258   LEUKOCYTESUR NEGATIVE 09/08/2013 1258    STUDIES: Mm Digital Diagnostic Unilat R  11/13/2014   ADDENDUM REPORT: 11/13/2014 14:08  ADDENDUM: PATHOLOGY ADDENDUM:  Pathology right breast mass: Invasive ductal carcinoma  Pathology concordance with imaging findings: Yes  Recommendation: Consultation regarding treatment plan. Multidisciplinary clinic appointment is scheduled for the patient on 11/19/2014. MRI appointment can be scheduled if needed.  At the request of the patient, I spoke with her by telephone on 11/13/2014 at 14:08. She reports  doing well after the biopsy .   Electronically Signed   By: Shon Hale M.D.   On: 11/13/2014 14:08   11/13/2014   CLINICAL DATA:  Status post ultrasound-guided core biopsy of mass in the 11 o'clock location of  right breast.  EXAM: DIAGNOSTIC RIGHT MAMMOGRAM POST ULTRASOUND BIOPSY  COMPARISON:  Previous exam(s).  FINDINGS: Mammographic images were obtained following ultrasound guided biopsy of mass in the 11 o'clock location of the right breast. A coil shaped clip is identified in the upper-outer quadrant of the right breast as expected.  IMPRESSION: Tissue marker clip is in expected location following biopsy.  Final Assessment: Post Procedure Mammograms for Marker Placement  Electronically Signed: By: Shon Hale M.D. On: 11/12/2014 15:19   Mm Digital Diagnostic Unilat R  10/24/2014   CLINICAL DATA:  Patient returns after screening study for evaluation of the right breast.  EXAM: DIGITAL DIAGNOSTIC  RIGHT MAMMOGRAM  ULTRASOUND RIGHT BREAST  COMPARISON:  10/16/2014 and earlier  ACR Breast Density Category b: There are scattered areas of fibroglandular density.  FINDINGS: Spot compression views confirm presence of an ill-defined mass in the upper outer quadrant of the right breast.  On physical exam, I palpate a discrete mass in the 11 o'clock location of the right breast.  Ultrasound is performed, showing an irregular hypoechoic taller than wide mass in the 11 o'clock location of the right breast 4 cm from the nipple. This measures 0.9 x 1.1 x 1.2 cm. Mass is vascular on Doppler evaluation. Evaluation of the right axilla is negative for adenopathy.  IMPRESSION: Suspicious mass in the 11 o'clock location of the right breast for which Tissue diagnosis is recommended.  RECOMMENDATION: Ultrasound-guided core biopsy is recommended and scheduled for the patient on 10/31/2014 at 12 o'clock p.m.  I have discussed the findings and recommendations with the patient. Results were also provided in writing at the conclusion of  the visit. If applicable, a reminder letter will be sent to the patient regarding the next appointment.  BI-RADS CATEGORY  4: Suspicious.   Electronically Signed   By: Shon Hale M.D.   On: 10/24/2014 17:12   US Breast Ltd Uni Right Inc Axilla  10/24/2014   CLINICAL DATA:  Patient returns after screening study for evaluation of the right breast.  EXAM: DIGITAL DIAGNOSTIC  RIGHT MAMMOGRAM  ULTRASOUND RIGHT BREAST  COMPARISON:  10/16/2014 and earlier  ACR Breast Density Category b: There are scattered areas of fibroglandular density.  FINDINGS: Spot compression views confirm presence of an ill-defined mass in the upper outer quadrant of the right breast.  On physical exam, I palpate a discrete mass in the 11 o'clock location of the right breast.  Ultrasound is performed, showing an irregular hypoechoic taller than wide mass in the 11 o'clock location of the right breast 4 cm from the nipple. This measures 0.9 x 1.1 x 1.2 cm. Mass is vascular on Doppler evaluation. Evaluation of the right axilla is negative for adenopathy.  IMPRESSION: Suspicious mass in the 11 o'clock location of the right breast for which Tissue diagnosis is recommended.  RECOMMENDATION: Ultrasound-guided core biopsy is recommended and scheduled for the patient on 10/31/2014 at 12 o'clock p.m.  I have discussed the findings and recommendations with the patient. Results were also provided in writing at the conclusion of the visit. If applicable, a reminder letter will be sent to the patient regarding the next appointment.  BI-RADS CATEGORY  4: Suspicious.   Electronically Signed   By: Shon Hale M.D.   On: 10/24/2014 17:12   [place] Rt Breast Bx W Loc Dev 1st Lesion Img Bx Spec US Guide  11/12/2014   CLINICAL DATA:  Patient presents for ultrasound-guided core biopsy of mass in the 11 o'clock location  of the right breast.  EXAM: ULTRASOUND GUIDED RIGHT BREAST CORE NEEDLE BIOPSY  COMPARISON:  Previous exam(s).  PROCEDURE: I met with the patient and we  discussed the procedure of ultrasound-guided biopsy, including benefits and alternatives. We discussed the high likelihood of a successful procedure. We discussed the risks of the procedure including infection, bleeding, tissue injury, clip migration, and inadequate sampling. Informed written consent was given. The usual time-out protocol was performed immediately prior to the procedure.  Using sterile technique and 2% Lidocaine as local anesthetic, under direct ultrasound visualization, a 12 gauge vacuum-assisted device was used to perform biopsy of mass in the 11 o'clock location of the right breastusing a caudal approach. At the conclusion of the procedure, a coil shaped tissue marker clip was deployed into the biopsy cavity. Follow-up 2-view mammogram was performed and dictated separately.  IMPRESSION: Ultrasound-guided biopsy of right breast mass. No apparent complications.   Electronically Signed   By: Shon Hale M.D.   On: 11/12/2014 15:02    ASSESSMENT: 59 y.o. [place], [place] woman status post right breast upper outer quadrant biopsy 11/12/2014 for a clinical T1c N0, stage IA invasive ductal carcinoma, triple negative, with an MIB-1 of 19%  (1) neoadjuvant chemotherapy will consist of cyclophosphamide and docetaxel 4 given every 21 days with Neulasta support  (2) genetics testing pending  (3) surgery to be guided by genetics results, and to take place at the conclusion of chemotherapy  (4) radiation to follow surgery  (5) continuing tobacco abuse. The patient has been strongly advised to quit smoking  PLAN: We spent the better part of today's hour-long appointment discussing the biology of breast cancer in general, and the specifics of the patient's tumor in particular. The patient understands that there is no advantage or disadvantage in starting with chemotherapy as opposed to starting with surgery. In her case we are recommending neoadjuvant chemotherapy because the patient  would benefit from genetic testing and if she proved to be positive she might want to consider something other than a lumpectomy and sentinel lymph node biopsy. Genetics testing takes approximately one month and we really do not want to delay the start of treatment that long.  She also understands why chemotherapy is the only option for systemic treatment in her situation. Anti-estrogens on anti-HER-2 therapy are simply not options.  Because we are dealing with a small tumor, which appears to be node negative, I think 4 cycles of cyclophosphamide and docetaxel would be sufficient. These would be given every 3 weeks, with Neulasta support on day 2. Today we discussed the possible toxicities, side effects and complications of these agents including issues related to peripheral neuropathy and immune suppression.  The patient does continue to smoke. I have strongly urged her to discontinue before the start of chemotherapy. This will not only help her tolerate the chemotherapy better but it will make it more effective. It will also help her heal more quickly from her surgery and it will make the radiation treatments more effective as well. She will be working on this. I have also urged her to start a gentle but persistent exercise program  The patient has a good understanding of the overall plan. She agrees with it. She knows the goal of treatment in her case is cure. She will call with any problems that may develop before her next visit here.  Chauncey Cruel, MD   11/19/2014 2:11 PM Medical Oncology and Hematology [facility] [place],  [place] [address] Tel. [phone]    Fax. [phone]    

## 2014-11-19 NOTE — Progress Notes (Addendum)
Radiation Oncology         [phone] ________________________________  Initial outpatient Consultation  Name: Katie Jacobson MRN: [id number]  Date: 11/19/2014  DOB: 01-Feb-1956  CC:O'SULLIVAN,MELISSA S., NP  Rolm Bookbinder, MD   REFERRING PHYSICIAN: Rolm Bookbinder, MD  DIAGNOSIS:    ICD-9-CM ICD-10-CM   1. Breast cancer of upper-outer quadrant of right female breast 174.4 C50.411     Stage I, T1cN0M0 Right Breast UOQ Invasive Ductal Carcinoma, ERneg / PRneg / Her2neg, Grade II   HISTORY OF PRESENT ILLNESS::Katie Jacobson is a 59 y.o. female who self-palpated a right breast UOQ mass.  This was then appreciated in mammography and on [place] this was 1.2cm in the 11:00 position.  Right breast biopsy revealed a grade II invasive ductal carcinoma, ER/PR negative, Her2 neg.   No MRI has been done of her breasts thus far.    She is anxious about her diagnosis. Otherwise in her USOH. She is here with her daughter and husband.  PREVIOUS RADIATION THERAPY: No  PAST MEDICAL HISTORY:  has a past medical history of Heart palpitations; Osteoporosis; Essential hypertension (04/01/2011); History of partial seizures (04/01/2011); Mitral valve prolapse (1990); Personal history of kidney stones (05/2012); Obesity (BMI 35.0-39.9 without comorbidity); and Breast cancer of upper-outer quadrant of right female breast (11/14/2014).    PAST SURGICAL HISTORY: Past Surgical History  Procedure Laterality Date  . Appendectomy  1976  . Abdominal hysterectomy  1998    ovaries spared  . Tonsillectomy  1970s  . Knee arthroscopy  2006    left  knee  . Cystectomy      Pt reports removal of a "stone" from the right side of her throat as a child  . Tubal ligation  1996  . Foot sx      X2  . Doppler echocardiography  03/27/1989    suggestive eidence of mitral valve proplapse miminal ; trace mitral regurgitation  . Nm myoview ltd  07/01/1996    EF 62%    FAMILY HISTORY: family history includes Cancer in  her cousin; Diabetes in her mother, sister, and sister; Heart attack in her father; Heart disease in her mother; Hypertension in her mother; Kidney disease in her brother; Mental retardation in her brother; Stroke in her mother. There is no history of Colon cancer, Esophageal cancer, or Stomach cancer.  SOCIAL HISTORY:  reports that she has been smoking Cigarettes.  She has a 39 pack-year smoking history. She has never used smokeless tobacco. She reports that she drinks about 4.2 oz of alcohol per week. She reports that she does not use illicit drugs.  ALLERGIES: Sulfa antibiotics  MEDICATIONS:  Current Outpatient Prescriptions  Medication Sig Dispense Refill  . chlorthalidone (HYGROTON) 25 MG tablet Take 25 mg by mouth every other day.    . chlorthalidone (HYGROTON) 25 MG tablet TAKE 1 TABLET BY MOUTH EVERY DAY 30 tablet 0  . enalapril (VASOTEC) 20 MG tablet Take 1 tablet (20 mg total) by mouth at bedtime. 30 tablet 11  . fluticasone (FLONASE) 50 MCG/ACT nasal spray Place 1 spray into both nostrils as needed.    . metoprolol succinate (TOPROL-XL) 50 MG 24 hr tablet Take 1 tablet (50 mg total) by mouth at bedtime. 30 tablet 11   No current facility-administered medications for this encounter.    REVIEW OF SYSTEMS:  Notable for that above.   PHYSICAL EXAM:      Vitals with BMI 11/19/2014  Height '5\' 1"'   Weight 215 lbs 11  oz  BMI 78.4  Systolic 696  Diastolic 71  Pulse 78  Respirations 18  General: Alert and oriented, in no acute distress HEENT: Head is normocephalic. Extraocular movements are intact.   Neck: Neck is supple, no palpable cervical or supraclavicular lymphadenopathy. Heart: Regular in rate and rhythm with no murmurs, rubs, or gallops. Chest: Clear to auscultation bilaterally, with no rhonchi, wheezes, or rales. Abdomen: Soft, nontender, nondistended, with no rigidity or guarding. Extremities: No cyanosis or edema. Lymphatics: see Neck Exam Skin: No concerning  lesions. Musculoskeletal: symmetric strength and muscle tone throughout. Neurologic: Cranial nerves II through XII are grossly intact. No obvious focalities. Speech is fluent.   Psychiatric: Judgment and insight are intact. Affect is appropriate. Breasts: notable for a 1.5cm mass in the UOQ of the breast  (right).  Left breast and b/l axillary regions are unremarkable.  ECOG = 0  0 - Asymptomatic (Fully active, able to carry on all predisease activities without restriction)  1 - Symptomatic but completely ambulatory (Restricted in physically strenuous activity but ambulatory and able to carry out work of a light or sedentary nature. For example, light housework, office work)  2 - Symptomatic, <50% in bed during the day (Ambulatory and capable of all self care but unable to carry out any work activities. Up and about more than 50% of waking hours)  3 - Symptomatic, >50% in bed, but not bedbound (Capable of only limited self-care, confined to bed or chair 50% or more of waking hours)  4 - Bedbound (Completely disabled. Cannot carry on any self-care. Totally confined to bed or chair)  5 - Death   Eustace Pen MM, Creech RH, Tormey DC, et al. [phone]). "Toxicity and response criteria of the [facility]". [place] Oncol. 5 (6): 649-55   LABORATORY DATA:  Lab Results  Component Value Date   WBC 6.8 11/19/2014   HGB 13.9 11/19/2014   HCT 42.9 11/19/2014   MCV 92.5 11/19/2014   PLT 220 11/19/2014   CMP     Component Value Date/Time   NA 143 11/19/2014 1241   NA 137 09/08/2013 1120   K 3.4* 11/19/2014 1241   K 3.6 09/08/2013 1120   CL 103 09/08/2013 1120   CO2 28 11/19/2014 1241   CO2 21 09/08/2013 1120   GLUCOSE 82 11/19/2014 1241   GLUCOSE 89 09/08/2013 1120   BUN 15.6 11/19/2014 1241   BUN 9 09/08/2013 1120   CREATININE 0.9 11/19/2014 1241   CREATININE 0.85 09/08/2013 1120   CREATININE 0.93 09/02/2013 1128   CALCIUM 9.3 11/19/2014 1241   CALCIUM 9.4  09/08/2013 1120   PROT 7.1 11/19/2014 1241   PROT 7.0 09/08/2013 1120   ALBUMIN 3.5 11/19/2014 1241   ALBUMIN 3.4* 09/08/2013 1120   AST 15 11/19/2014 1241   AST 15 09/08/2013 1120   ALT 13 11/19/2014 1241   ALT 12 09/08/2013 1120   ALKPHOS 73 11/19/2014 1241   ALKPHOS 80 09/08/2013 1120   BILITOT 0.43 11/19/2014 1241   BILITOT 0.5 09/08/2013 1120   GFRNONAA 76* 09/08/2013 1120   GFRNONAA 70 09/02/2013 1128   GFRAA 88* 09/08/2013 1120   GFRAA 81 09/02/2013 1128         RADIOGRAPHY: Mm Digital Diagnostic Unilat R  11/13/2014   ADDENDUM REPORT: 11/13/2014 14:08  ADDENDUM: PATHOLOGY ADDENDUM:  Pathology right breast mass: Invasive ductal carcinoma  Pathology concordance with imaging findings: Yes  Recommendation: Consultation regarding treatment plan. Multidisciplinary clinic appointment is scheduled for the  patient on 11/19/2014. MRI appointment can be scheduled if needed.  At the request of the patient, I spoke with her by telephone on 11/13/2014 at 14:08. She reports doing well after the biopsy .   Electronically Signed   By: Shon Hale M.D.   On: 11/13/2014 14:08   11/13/2014   CLINICAL DATA:  Status post ultrasound-guided core biopsy of mass in the 11 o'clock location of right breast.  EXAM: DIAGNOSTIC RIGHT MAMMOGRAM POST ULTRASOUND BIOPSY  COMPARISON:  Previous exam(s).  FINDINGS: Mammographic images were obtained following ultrasound guided biopsy of mass in the 11 o'clock location of the right breast. A coil shaped clip is identified in the upper-outer quadrant of the right breast as expected.  IMPRESSION: Tissue marker clip is in expected location following biopsy.  Final Assessment: Post Procedure Mammograms for Marker Placement  Electronically Signed: By: Shon Hale M.D. On: 11/12/2014 15:19   Mm Digital Diagnostic Unilat R  10/24/2014   CLINICAL DATA:  Patient returns after screening study for evaluation of the right breast.  EXAM: DIGITAL DIAGNOSTIC  RIGHT MAMMOGRAM  ULTRASOUND  RIGHT BREAST  COMPARISON:  10/16/2014 and earlier  ACR Breast Density Category b: There are scattered areas of fibroglandular density.  FINDINGS: Spot compression views confirm presence of an ill-defined mass in the upper outer quadrant of the right breast.  On physical exam, I palpate a discrete mass in the 11 o'clock location of the right breast.  Ultrasound is performed, showing an irregular hypoechoic taller than wide mass in the 11 o'clock location of the right breast 4 cm from the nipple. This measures 0.9 x 1.1 x 1.2 cm. Mass is vascular on Doppler evaluation. Evaluation of the right axilla is negative for adenopathy.  IMPRESSION: Suspicious mass in the 11 o'clock location of the right breast for which Tissue diagnosis is recommended.  RECOMMENDATION: Ultrasound-guided core biopsy is recommended and scheduled for the patient on 10/31/2014 at 12 o'clock p.m.  I have discussed the findings and recommendations with the patient. Results were also provided in writing at the conclusion of the visit. If applicable, a reminder letter will be sent to the patient regarding the next appointment.  BI-RADS CATEGORY  4: Suspicious.   Electronically Signed   By: Shon Hale M.D.   On: 10/24/2014 17:12   US Breast Ltd Uni Right Inc Axilla  10/24/2014   CLINICAL DATA:  Patient returns after screening study for evaluation of the right breast.  EXAM: DIGITAL DIAGNOSTIC  RIGHT MAMMOGRAM  ULTRASOUND RIGHT BREAST  COMPARISON:  10/16/2014 and earlier  ACR Breast Density Category b: There are scattered areas of fibroglandular density.  FINDINGS: Spot compression views confirm presence of an ill-defined mass in the upper outer quadrant of the right breast.  On physical exam, I palpate a discrete mass in the 11 o'clock location of the right breast.  Ultrasound is performed, showing an irregular hypoechoic taller than wide mass in the 11 o'clock location of the right breast 4 cm from the nipple. This measures 0.9 x 1.1 x 1.2 cm.  Mass is vascular on Doppler evaluation. Evaluation of the right axilla is negative for adenopathy.  IMPRESSION: Suspicious mass in the 11 o'clock location of the right breast for which Tissue diagnosis is recommended.  RECOMMENDATION: Ultrasound-guided core biopsy is recommended and scheduled for the patient on 10/31/2014 at 12 o'clock p.m.  I have discussed the findings and recommendations with the patient. Results were also provided in writing at the conclusion of the visit.  If applicable, a reminder letter will be sent to the patient regarding the next appointment.  BI-RADS CATEGORY  4: Suspicious.   Electronically Signed   By: Shon Hale M.D.   On: 10/24/2014 17:12   [place] Rt Breast Bx W Loc Dev 1st Lesion Img Bx Spec US Guide  11/12/2014   CLINICAL DATA:  Patient presents for ultrasound-guided core biopsy of mass in the 11 o'clock location of the right breast.  EXAM: ULTRASOUND GUIDED RIGHT BREAST CORE NEEDLE BIOPSY  COMPARISON:  Previous exam(s).  PROCEDURE: I met with the patient and we discussed the procedure of ultrasound-guided biopsy, including benefits and alternatives. We discussed the high likelihood of a successful procedure. We discussed the risks of the procedure including infection, bleeding, tissue injury, clip migration, and inadequate sampling. Informed written consent was given. The usual time-out protocol was performed immediately prior to the procedure.  Using sterile technique and 2% Lidocaine as local anesthetic, under direct ultrasound visualization, a 12 gauge vacuum-assisted device was used to perform biopsy of mass in the 11 o'clock location of the right breastusing a caudal approach. At the conclusion of the procedure, a coil shaped tissue marker clip was deployed into the biopsy cavity. Follow-up 2-view mammogram was performed and dictated separately.  IMPRESSION: Ultrasound-guided biopsy of right breast mass. No apparent complications.   Electronically Signed   By: Shon Hale M.D.    On: 11/12/2014 15:02      IMPRESSION/PLAN:  She has been discussed at our multidisciplinary tumor board. The consensus is that she would be a good candidate for breast conservation. I talked to her about the option of a mastectomy and informed her that her expected overall survival would be equivalent between mastectomy and breast conservation, based upon randomized controlled data. She is appears enthusiastic about breast conservation.  Plan is for genetics referral, neoadjuvant chemotherapy, lumpectomy and SLN bx, and then adjuvant radiotherapy. MRI of breasts pending.   It was a pleasure meeting the patient today. We discussed the risks, benefits, and side effects of radiotherapy. We discussed that radiation would take approximately 6-7 weeks to complete. We spoke about acute effects including skin irritation and fatigue as well as much less common late effects including lung  irritation. We spoke about the latest technology that is used to minimize the risk of late effects for breast cancer patients undergoing radiotherapy. No guarantees of treatment were given. The patient is enthusiastic about proceeding with treatment. I look forward to participating in the patient's care.  __________________________________________   Eppie Gibson, MD

## 2014-11-19 NOTE — Therapy (Signed)
[place], [place], [address] Phone: [phone]   Fax:  [phone]  Physical Therapy Evaluation  Patient Details  Name: Katie Jacobson MRN: [id number] Date of Birth: 1955-10-19 Referring Provider:  Rolm Bookbinder, MD  Encounter Date: 11/19/2014      PT End of Session - 11/19/14 1619    Visit Number 1   Number of Visits 1   PT Start Time 1503   PT Stop Time 1525   PT Time Calculation (min) 22 min   Activity Tolerance Patient tolerated treatment well   Behavior During Therapy [facility] for tasks assessed/performed      Past Medical History  Diagnosis Date  . Heart palpitations   . Osteoporosis   . Essential hypertension 04/01/2011  . History of partial seizures 04/01/2011  . Mitral valve prolapse 1990  . Personal history of kidney stones 05/2012  . Obesity (BMI 35.0-39.9 without comorbidity)   . Breast cancer of upper-outer quadrant of right female breast 11/14/2014    Past Surgical History  Procedure Laterality Date  . Appendectomy  1976  . Abdominal hysterectomy  1998    ovaries spared  . Tonsillectomy  1970s  . Knee arthroscopy  2006    left  knee  . Cystectomy      Pt reports removal of a "stone" from the right side of her throat as a child  . Tubal ligation  1996  . Foot sx      X2  . Doppler echocardiography  03/27/1989    suggestive eidence of mitral valve proplapse miminal ; trace mitral regurgitation  . Nm myoview ltd  07/01/1996    EF 62%    LMP 10/10/1996  Visit Diagnosis:  Abnormal posture - Plan: PT plan of care cert/re-cert  At risk for lymphedema - Plan: PT plan of care cert/re-cert  Breast cancer, right breast - Plan: PT plan of care cert/re-cert      Subjective Assessment - 11/19/14 1611    Symptoms Patient was diagnosed with right breast cancer and is being seen today for a baseline assessment.   Pertinent History Patient diagnosed 11/13/14 with right Triple  negative breast cancer with a Ki67 of 20%.  She was seen today by the breast team for a baseline assessment.   Patient Stated Goals Reduce lymphedema risk and learn post op shoulder ROM exercises   Currently in Pain? No/denies          [facility] PT Assessment - 11/19/14 0001    Assessment   Medical Diagnosis right breast cancer   Onset Date 11/13/14   Precautions   Precautions Other (comment)  Active cancer   Restrictions   Weight Bearing Restrictions No   Balance Screen   Has the patient fallen in the past 6 months No   Has the patient had a decrease in activity level because of a fear of falling?  No   Is the patient reluctant to leave their home because of a fear of falling?  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   Vocation Requirements has to lift box from her car and carry into house   Leisure She does not exercise   Cognition   Overall Cognitive  Status Within Functional Limits for tasks assessed   Posture/Postural Control   Posture/Postural Control Postural limitations   Postural Limitations Rounded Shoulders;Forward head   AROM   Right Shoulder Extension 56 Degrees   Right Shoulder Flexion 145 Degrees   Right Shoulder ABduction 151 Degrees   Right Shoulder Internal Rotation 62 Degrees   Right Shoulder External Rotation 76 Degrees   Left Shoulder Extension 56 Degrees   Left Shoulder Flexion 155 Degrees   Left Shoulder ABduction 160 Degrees   Left Shoulder Internal Rotation 66 Degrees   Left Shoulder External Rotation 86 Degrees   Strength   Overall Strength Within functional limits for tasks performed           LYMPHEDEMA/ONCOLOGY QUESTIONNAIRE - 11/19/14 1617    Type   Cancer Type Right breast    Lymphedema Assessments   Lymphedema Assessments Upper extremities   Right Upper Extremity Lymphedema   10 cm Proximal to Olecranon Process 37 cm   Olecranon Process 27.5 cm   10 cm Proximal to Ulnar Styloid Process 25.4 cm   Just Proximal to Ulnar Styloid Process 16.8 cm   Across Hand at [employer] 19.5 cm   At [place] of 2nd Digit 5.8 cm   Left Upper Extremity Lymphedema   10 cm Proximal to Olecranon Process 35 cm   Olecranon Process 27.3 cm   10 cm Proximal to Ulnar Styloid Process 24.8 cm   Just Proximal to Ulnar Styloid Process 17.2 cm   Across Hand at [employer] 19 cm   At [place] of 2nd Digit 5.9 cm                 PT Education - 11/19/14 1618    Education provided Yes   Education Details Post op shoulder ROM HEP and lymphedema risk reduction   Person(s) Educated Patient;Spouse;Child(ren)   Methods Explanation;Demonstration;Handout   Comprehension Verbalized understanding       Patient was instructed today in a home exercise program today for post op shoulder range of motion. These included active assist shoulder flexion in sitting, scapular retraction, wall walking with shoulder abduction, and hands behind head external rotation.  She was encouraged to do these twice a day, holding 3 seconds and repeating 5 times when permitted by her physician.           Breast Clinic Goals - 11/19/14 1626    Patient will be able to verbalize understanding of pertinent lymphedema risk reduction practices relevant to her diagnosis specifically related to skin care.   Time 1   Period Days   Status Achieved   Patient will be able to return demonstrate and/or verbalize understanding of the post-op home exercise program related to regaining shoulder range of motion.   Time 1   Period Days   Status Achieved   Patient will be able to verbalize understanding of the importance of attending the postoperative After Breast Cancer Class for further lymphedema risk reduction  education and therapeutic exercise.   Time 1   Period Days   Status Achieved              Plan - 11/19/14 1620    Clinical Impression Statement Patient was recently diagnosed with right Triple negative breast cancer and was seen today for a baseline assessment.  She is planning tohave neoadjuvant chemotherapy followed by a right lumpectomy and sentinel node biopsy and possibly radiation.  She will likely benefit from physical therapy after surgery to regain ROM and  strength and prevent ly mphedema.   Pt will benefit from skilled therapeutic intervention in order to improve on the following deficits Decreased range of motion;Increased edema;Impaired UE functional use;Decreased knowledge of precautions;Pain;Decreased strength   Rehab Potential Good   Clinical Impairments Affecting Rehab Potential none   PT Frequency One time visit   PT Treatment/Interventions Patient/family education;Therapeutic exercise   Consulted and Agree with Plan of Care Patient;Family member/caregiver   Family Member Consulted Daughter     Patient will follow up at outpatient cancer rehab if needed following surgery.  If the patient requires physical therapy at that time, a specific plan will be dictated and sent to the referring physician for approval. The patient was educated today on appropriate basic range of motion exercises to begin post operatively and the importance of attending the After Breast Cancer class following surgery.  Patient was educated today on lymphedema risk reduction practices as it pertains to recommendations that will benefit the patient immediately following surgery.  She verbalized good understanding.  No additional physical therapy is indicated at this time.       Problem List Patient Active Problem List   Diagnosis Date Noted  . Breast cancer of upper-outer quadrant of right female breast 11/14/2014  . Heart palpitations   . Obesity (BMI 35.0-39.9 without comorbidity)   . Need  for pneumococcal vaccine 07/06/2012  . Hyperlipidemia LDL goal <130 07/07/2011  . General medical examination 07/07/2011  . Fibroids 04/01/2011  . History of partial seizures 04/01/2011  . Asthma 04/01/2011  . Mitral valve prolapse 04/01/2011  . Hot flashes 04/01/2011  . Tobacco abuse 04/01/2011  . Essential hypertension 04/01/2011    Annia Friendly, PT 11/19/2014, 4:28 PM  [place], [place], [address] Phone: [phone]   Fax:  [phone]

## 2014-11-20 ENCOUNTER — Other Ambulatory Visit: Payer: BLUE CROSS/BLUE SHIELD

## 2014-11-20 ENCOUNTER — Telehealth: Payer: Self-pay | Admitting: Adult Health

## 2014-11-20 ENCOUNTER — Telehealth: Payer: Self-pay | Admitting: Oncology

## 2014-11-20 ENCOUNTER — Ambulatory Visit (HOSPITAL_BASED_OUTPATIENT_CLINIC_OR_DEPARTMENT_OTHER): Payer: BLUE CROSS/BLUE SHIELD | Admitting: Genetic Counselor

## 2014-11-20 ENCOUNTER — Encounter: Payer: Self-pay | Admitting: Genetic Counselor

## 2014-11-20 DIAGNOSIS — C50411 Malignant neoplasm of upper-outer quadrant of right female breast: Secondary | ICD-10-CM

## 2014-11-20 DIAGNOSIS — Z315 Encounter for genetic counseling: Secondary | ICD-10-CM

## 2014-11-20 DIAGNOSIS — Z803 Family history of malignant neoplasm of breast: Secondary | ICD-10-CM

## 2014-11-20 NOTE — Telephone Encounter (Signed)
I received a voicemail from Ms. Suydam this morning with her letting me know that she was confused about her appointments scheduled for today. She mentioned that she thought she had an appointment at 8:30am this morning.   I returned Ms. Shimkus's call and left her a voicemail letting her know that her Genetics appointment (the only appointment I could see in EPIC for her today), is scheduled for 2:30pm.  I left my office number for her to return my call if she had any additional questions or concerns.  I will make the breast nurse navigators aware of this phone call so that they may follow-up as needed as well.   Mike Craze, NP [place] [phone]

## 2014-11-20 NOTE — Progress Notes (Signed)
Patient Name: Katie Jacobson Patient Age: 59 y.o. Encounter Date: 11/20/2014  Referring Physician: Lurline Del, MD  Primary Care Provider: Nance Pear., NP   [name], a 59 y.o. female, is being seen at the [facility] due to a personal and family history of breast cancer. She presents to clinic today to discuss the possibility of a hereditary predisposition to cancer and discuss whether genetic testing is warranted.   HISTORY OF PRESENT ILLNESS: Katie Jacobson was recently diagnosed with right breast cancer (IDC) at the age of 40. She stated the plan is for neoadjuvant chemotherapy. The breast tumor was ER negative, PR negative, and HER2 negative.  She stated that she had a hysterectomy at age 35 due to excessive bleeding, but her ovaries remain intact. She had a colonoscopy ~2 years ago which she thinks was negative for polyps.    Breast cancer of upper-outer quadrant of right female breast   10/24/2014 Breast US Ultrasound is performed, showing an irregular hypoechoic taller than wide mass in the 11 o'clock location of the right breast 4 cm from the nipple. This measures 0.9 x 1.1 x 1.2 cm. Evaluation of the right axilla is negative for adenopathy.   10/24/2014 Mammogram Spot compression views confirm presence of an ill-defined mass in the upper outer quadrant of the right breast.   11/12/2014 Initial Biopsy Breast, right, needle core biopsy, mass, upper outer quadrant - INVASIVE DUCTAL CARCINOMA.   11/12/2014 Receptors her2 Estrogen Receptor: 0%, NEGATIVE Progesterone Receptor: 0%, NEGATIVE Proliferation Marker Ki67: 19%A sample was sent to [facility] for Her 2 testing. The results are as follows: Negative.   11/14/2014 Initial Diagnosis Breast cancer of upper-outer quadrant of right female breast    Past Medical History  Diagnosis Date  . Heart palpitations   . Osteoporosis   . Essential hypertension 04/01/2011  . History of partial seizures 04/01/2011   . Mitral valve prolapse 1990  . Personal history of kidney stones 05/2012  . Obesity (BMI 35.0-39.9 without comorbidity)   . Breast cancer of upper-outer quadrant of right female breast 11/14/2014  . Family history of breast cancer     Past Surgical History  Procedure Laterality Date  . Appendectomy  1976  . Abdominal hysterectomy  1998    ovaries spared  . Tonsillectomy  1970s  . Knee arthroscopy  2006    left  knee  . Cystectomy      Pt reports removal of a "stone" from the right side of her throat as a child  . Tubal ligation  1996  . Foot sx      X2  . Doppler echocardiography  03/27/1989    suggestive eidence of mitral valve proplapse miminal ; trace mitral regurgitation  . Nm myoview ltd  07/01/1996    EF 62%    History   Social History  . Marital Status: Married    Spouse Name: N/A  . Number of Children: N/A  . Years of Education: N/A   Social History Main Topics  . Smoking status: Current Every Day Smoker -- 1.00 packs/day for 39 years    Types: Cigarettes  . Smokeless tobacco: Never Used  . Alcohol Use: 4.2 oz/week    7 Cans of beer per week  . Drug Use: No  . Sexual Activity: Not on file   Other Topics Concern  . 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  Has been doing part time work with [employer] -- had 6 active clients, but the most important one is her Daughter.   Regular exercise:  No   Caffeine Use: none; Current 1ppd smoker x > 39 yrs; EtOH ~4 oz/week     FAMILY HISTORY:   During the visit, a 4-generation pedigree was obtained. Family tree will be sent for scanning and will be in EPIC under the Media tab.   Katie Jacobson has two daughters (age 27 and 77) and a one full brother. She has one paternal half-brother and 2 paternal half-sisters. Her mother died at 45 due to stroke. Katie Jacobson has one maternal aunt and 4  maternal uncles. One maternal uncle had a daughter who died of breast cancer at age 44. Katie Jacobson has no information about her father or any paternal relatives as he was estranged from the family when she was very young.  Katie Jacobson ancestry is African American. There is no known Jewish ancestry and no consanguinity.  ASSESSMENT AND PLAN: Katie Jacobson is a 59 y.o. female with a personal history of triple negative breast cancer and a family history of breast cancer in a maternal first cousin. This history is not highly suggestive of a hereditary predisposition to cancer. Genetic testing is recommended, however, due to her triple negative breast cancer and very limited family history. Her mother died in her 34s and she has no information at all about paternal relatives.. We reviewed the characteristics, features and inheritance patterns of hereditary cancer syndromes. We also discussed genetic testing, including the process of testing, insurance coverage and implications of results. A negative result will be reassuring.  Katie Jacobson wished to pursue genetic testing and a blood sample will be sent to [facility] for analysis of the 17 genes on the BreastNext gene panel (ATM, BARD1, BRCA1, BRCA2, BRIP1, CDH1, CHEK2, MRE11A, MUTYH, NBN, NF1, PALB2, PTEN, RAD50, RAD51C, RAD51D, and TP53). We discussed the implications of a positive, negative and/ or Variant of Uncertain Significance (VUS) result. Results should be available in approximately 4-5 weeks, at which point we will contact her and address implications for her as well as address genetic testing for at-risk family members, if needed.    We encouraged Katie Jacobson to remain in contact with Cancer Genetics annually so that we can update the family history and inform her of any changes in cancer genetics and testing that may be of benefit for this family. [name] questions were answered to her satisfaction today.   Thank you for the referral and  allowing [place] to share in the care of your patient.   The patient was seen for a total of 30 minutes, greater than 50% of which was spent face-to-face counseling. This patient was discussed with the overseeing provider who agrees with the above.   Steele Berg, MS, [place] Certified Genetic Counselor phone: [phone] [email or web address]

## 2014-11-21 ENCOUNTER — Encounter (HOSPITAL_BASED_OUTPATIENT_CLINIC_OR_DEPARTMENT_OTHER): Payer: Self-pay | Admitting: *Deleted

## 2014-11-21 ENCOUNTER — Encounter: Payer: Self-pay | Admitting: General Practice

## 2014-11-21 ENCOUNTER — Ambulatory Visit
Admission: RE | Admit: 2014-11-21 | Discharge: 2014-11-21 | Disposition: A | Discharge status: Home / Self Care | Payer: BLUE CROSS/BLUE SHIELD | Source: Ambulatory Visit | Attending: Oncology | Admitting: Oncology

## 2014-11-21 DIAGNOSIS — C50411 Malignant neoplasm of upper-outer quadrant of right female breast: Secondary | ICD-10-CM

## 2014-11-21 MED ORDER — GADOBENATE DIMEGLUMINE 529 MG/ML IV SOLN: 20.0000 mL | Freq: Once | INTRAVENOUS | Status: AC | PRN

## 2014-11-21 NOTE — Progress Notes (Signed)
[place] Psychosocial Distress Screening Spiritual Care  Visited with Ms Player in breast clinic to introduce [facility] team/resources, and to review distress screen per protocol.  The patient scored a 8 on the Psychosocial Distress Thermometer which indicates severe distress. Also assessed for stress and other psychosocial needs.   ONCBCN DISTRESS SCREENING 11/21/2014  Screening Type Initial Screening  Distress experienced in past week (1-10) 8  Practical problem type Insurance;Work/school  Emotional problem type Depression;Nervousness/Anxiety;Adjusting to illness  Information Concerns Type Lack of info about treatment  Referral to support programs Yes  Other Spiritual Care   Per pt, distress has decreased to ca 5 after meeting with medical team in clinic.  The emotional distress related to dx/tx is her top concern, followed by financial concerns related to employment/insurance issues.  Encouraged her to explore options with Estate manager/land agent.  Provided spiritual and emotional support as she processed her life changes associated with dx/tx.  Follow up needed: No.  Per pt's permission, will refer to [facility].  Pt aware of ongoing chaplain/Support Team availability and has info packet/contact info.  Please also page as needs arise.  Thank you.  [place], [place]

## 2014-11-21 NOTE — Progress Notes (Signed)
   11/21/14 1321  OBSTRUCTIVE SLEEP APNEA  Have you ever been diagnosed with sleep apnea through a sleep study? No  Do you snore loudly (loud enough to be heard through closed doors)?  0  Do you often feel tired, fatigued, or sleepy during the daytime? 0  Has anyone observed you stop breathing during your sleep? 0  Do you have, or are you being treated for high blood pressure? 1  BMI more than 35 kg/m2? 1  Age over 57 years old? 1  Neck circumference greater than 40 cm/16 inches? 1  Gender: 0  Obstructive Sleep Apnea Score 4  Score 4 or greater  Results sent to PCP

## 2014-11-21 NOTE — Progress Notes (Signed)
Pt to have chemo before surgery Labs done 11/19/14, ekg 10/15-dr hardy for mild mvp

## 2014-11-25 ENCOUNTER — Other Ambulatory Visit: Payer: BLUE CROSS/BLUE SHIELD

## 2014-11-25 ENCOUNTER — Encounter: Payer: Self-pay | Admitting: *Deleted

## 2014-11-27 ENCOUNTER — Ambulatory Visit (HOSPITAL_BASED_OUTPATIENT_CLINIC_OR_DEPARTMENT_OTHER): Payer: BLUE CROSS/BLUE SHIELD | Admitting: Anesthesiology

## 2014-11-27 ENCOUNTER — Encounter (HOSPITAL_BASED_OUTPATIENT_CLINIC_OR_DEPARTMENT_OTHER): Payer: Self-pay | Admitting: *Deleted

## 2014-11-27 ENCOUNTER — Ambulatory Visit (HOSPITAL_COMMUNITY): Payer: BLUE CROSS/BLUE SHIELD

## 2014-11-27 ENCOUNTER — Ambulatory Visit (HOSPITAL_BASED_OUTPATIENT_CLINIC_OR_DEPARTMENT_OTHER)
Admission: RE | Admit: 2014-11-27 | Discharge: 2014-11-27 | Disposition: A | Discharge status: Home / Self Care | Payer: BLUE CROSS/BLUE SHIELD | Source: Ambulatory Visit | Attending: General Surgery | Admitting: General Surgery

## 2014-11-27 ENCOUNTER — Encounter (HOSPITAL_BASED_OUTPATIENT_CLINIC_OR_DEPARTMENT_OTHER)
Admission: RE | Disposition: A | Discharge status: Home / Self Care | Payer: Self-pay | Source: Ambulatory Visit | Attending: General Surgery

## 2014-11-27 DIAGNOSIS — I4891 Unspecified atrial fibrillation: Secondary | ICD-10-CM | POA: Insufficient documentation

## 2014-11-27 DIAGNOSIS — C50919 Malignant neoplasm of unspecified site of unspecified female breast: Secondary | ICD-10-CM | POA: Diagnosis present

## 2014-11-27 DIAGNOSIS — Z9089 Acquired absence of other organs: Secondary | ICD-10-CM | POA: Insufficient documentation

## 2014-11-27 DIAGNOSIS — C50911 Malignant neoplasm of unspecified site of right female breast: Secondary | ICD-10-CM | POA: Insufficient documentation

## 2014-11-27 DIAGNOSIS — Z90710 Acquired absence of both cervix and uterus: Secondary | ICD-10-CM | POA: Insufficient documentation

## 2014-11-27 DIAGNOSIS — M1990 Unspecified osteoarthritis, unspecified site: Secondary | ICD-10-CM | POA: Diagnosis not present

## 2014-11-27 DIAGNOSIS — G40909 Epilepsy, unspecified, not intractable, without status epilepticus: Secondary | ICD-10-CM | POA: Diagnosis not present

## 2014-11-27 DIAGNOSIS — Z859 Personal history of malignant neoplasm, unspecified: Secondary | ICD-10-CM | POA: Diagnosis not present

## 2014-11-27 DIAGNOSIS — Z9889 Other specified postprocedural states: Secondary | ICD-10-CM | POA: Diagnosis not present

## 2014-11-27 DIAGNOSIS — Z95828 Presence of other vascular implants and grafts: Secondary | ICD-10-CM

## 2014-11-27 DIAGNOSIS — Z171 Estrogen receptor negative status [ER-]: Secondary | ICD-10-CM | POA: Diagnosis not present

## 2014-11-27 DIAGNOSIS — I10 Essential (primary) hypertension: Secondary | ICD-10-CM | POA: Insufficient documentation

## 2014-11-27 DIAGNOSIS — Z803 Family history of malignant neoplasm of breast: Secondary | ICD-10-CM | POA: Diagnosis not present

## 2014-11-27 DIAGNOSIS — F17200 Nicotine dependence, unspecified, uncomplicated: Secondary | ICD-10-CM | POA: Insufficient documentation

## 2014-11-27 DIAGNOSIS — Z79899 Other long term (current) drug therapy: Secondary | ICD-10-CM | POA: Diagnosis not present

## 2014-11-27 HISTORY — PX: PORTACATH PLACEMENT: SHX2246

## 2014-11-27 SURGERY — INSERTION, TUNNELED CENTRAL VENOUS DEVICE, WITH PORT
Anesthesia: General

## 2014-11-27 MED ORDER — HEPARIN (PORCINE) IN NACL 2-0.9 UNIT/ML-% IJ SOLN
INTRAMUSCULAR | Status: AC
  Filled 2014-11-27: qty 500

## 2014-11-27 MED ORDER — MIDAZOLAM HCL 2 MG/2ML IJ SOLN
INTRAMUSCULAR | Status: AC
  Filled 2014-11-27: qty 2

## 2014-11-27 MED ORDER — CEFAZOLIN SODIUM-DEXTROSE 2-3 GM-% IV SOLR
2.0000 g | INTRAVENOUS | Status: AC
  Administered 2014-11-27: 2 g via INTRAVENOUS

## 2014-11-27 MED ORDER — MIDAZOLAM HCL 2 MG/2ML IJ SOLN: 1.0000 mg | INTRAMUSCULAR | Status: DC | PRN

## 2014-11-27 MED ORDER — MEPERIDINE HCL 25 MG/ML IJ SOLN: 6.2500 mg | INTRAMUSCULAR | Status: DC | PRN

## 2014-11-27 MED ORDER — HEPARIN SOD (PORK) LOCK FLUSH 100 UNIT/ML IV SOLN
INTRAVENOUS | Status: DC | PRN
  Administered 2014-11-27: 500 [IU] via INTRAVENOUS

## 2014-11-27 MED ORDER — ONDANSETRON HCL 4 MG/2ML IJ SOLN: 4.0000 mg | Freq: Once | INTRAMUSCULAR | Status: DC | PRN

## 2014-11-27 MED ORDER — MIDAZOLAM HCL 5 MG/5ML IJ SOLN
INTRAMUSCULAR | Status: DC | PRN
  Administered 2014-11-27: 2 mg via INTRAVENOUS

## 2014-11-27 MED ORDER — EPHEDRINE SULFATE 50 MG/ML IJ SOLN
INTRAMUSCULAR | Status: DC | PRN
  Administered 2014-11-27: 10 mg via INTRAVENOUS

## 2014-11-27 MED ORDER — OXYCODONE-ACETAMINOPHEN 5-325 MG PO TABS
ORAL_TABLET | ORAL | Status: AC
  Filled 2014-11-27: qty 2

## 2014-11-27 MED ORDER — HEPARIN SOD (PORK) LOCK FLUSH 100 UNIT/ML IV SOLN
INTRAVENOUS | Status: AC
  Filled 2014-11-27: qty 5

## 2014-11-27 MED ORDER — OXYCODONE-ACETAMINOPHEN 5-325 MG PO TABS
2.0000 | ORAL_TABLET | Freq: Once | ORAL | Status: AC
  Administered 2014-11-27: 2 via ORAL

## 2014-11-27 MED ORDER — FENTANYL CITRATE 0.05 MG/ML IJ SOLN: 50.0000 ug | INTRAMUSCULAR | Status: DC | PRN

## 2014-11-27 MED ORDER — LIDOCAINE HCL (CARDIAC) 20 MG/ML IV SOLN
INTRAVENOUS | Status: DC | PRN
  Administered 2014-11-27: 50 mg via INTRAVENOUS

## 2014-11-27 MED ORDER — FENTANYL CITRATE 0.05 MG/ML IJ SOLN
INTRAMUSCULAR | Status: DC | PRN
  Administered 2014-11-27: 100 ug via INTRAVENOUS

## 2014-11-27 MED ORDER — LACTATED RINGERS IV SOLN
INTRAVENOUS | Status: DC
  Administered 2014-11-27: 10:00:00 via INTRAVENOUS

## 2014-11-27 MED ORDER — HEPARIN (PORCINE) IN NACL 2-0.9 UNIT/ML-% IJ SOLN
INTRAMUSCULAR | Status: DC | PRN
  Administered 2014-11-27: 1 via INTRAVENOUS

## 2014-11-27 MED ORDER — OXYCODONE-ACETAMINOPHEN 10-325 MG PO TABS: 1.0000 | ORAL_TABLET | Freq: Four times a day (QID) | ORAL | Status: DC | PRN

## 2014-11-27 MED ORDER — HYDROMORPHONE HCL 1 MG/ML IJ SOLN: 0.2500 mg | INTRAMUSCULAR | Status: DC | PRN

## 2014-11-27 MED ORDER — CEFAZOLIN SODIUM-DEXTROSE 2-3 GM-% IV SOLR
INTRAVENOUS | Status: AC
  Filled 2014-11-27: qty 50

## 2014-11-27 MED ORDER — PROPOFOL 10 MG/ML IV BOLUS
INTRAVENOUS | Status: DC | PRN
  Administered 2014-11-27: 150 mg via INTRAVENOUS

## 2014-11-27 MED ORDER — FENTANYL CITRATE 0.05 MG/ML IJ SOLN
INTRAMUSCULAR | Status: AC
  Filled 2014-11-27: qty 4

## 2014-11-27 MED ORDER — DEXAMETHASONE SODIUM PHOSPHATE 4 MG/ML IJ SOLN
INTRAMUSCULAR | Status: DC | PRN
  Administered 2014-11-27: 10 mg via INTRAVENOUS

## 2014-11-27 MED ORDER — ONDANSETRON HCL 4 MG/2ML IJ SOLN
INTRAMUSCULAR | Status: DC | PRN
  Administered 2014-11-27: 4 mg via INTRAVENOUS

## 2014-11-27 MED ORDER — BUPIVACAINE HCL (PF) 0.25 % IJ SOLN
INTRAMUSCULAR | Status: DC | PRN
  Administered 2014-11-27: 10 mL

## 2014-11-27 SURGICAL SUPPLY — 50 items
BAG DECANTER FOR FLEXI CONT (MISCELLANEOUS) ×2 IMPLANT
BENZOIN TINCTURE PRP APPL 2/3 (GAUZE/BANDAGES/DRESSINGS) ×2 IMPLANT
BLADE SURG 11 STRL SS (BLADE) ×2 IMPLANT
BLADE SURG 15 STRL LF DISP TIS (BLADE) ×1 IMPLANT
BLADE SURG 15 STRL SS (BLADE) ×1
CANISTER SUCT 1200ML W/VALVE (MISCELLANEOUS) IMPLANT
CHLORAPREP W/TINT 26ML (MISCELLANEOUS) ×2 IMPLANT
COVER BACK TABLE 60X90IN (DRAPES) ×2 IMPLANT
COVER MAYO STAND STRL (DRAPES) ×2 IMPLANT
DECANTER SPIKE VIAL GLASS SM (MISCELLANEOUS) IMPLANT
DRAPE C-ARM 42X72 X-RAY (DRAPES) ×2 IMPLANT
DRAPE LAPAROSCOPIC ABDOMINAL (DRAPES) ×2 IMPLANT
DRAPE UTILITY XL STRL (DRAPES) ×2 IMPLANT
DRSG TEGADERM 4X4.75 (GAUZE/BANDAGES/DRESSINGS) IMPLANT
ELECT COATED BLADE 2.86 ST (ELECTRODE) ×2 IMPLANT
ELECT REM PT RETURN 9FT ADLT (ELECTROSURGICAL) ×2
ELECTRODE REM PT RTRN 9FT ADLT (ELECTROSURGICAL) ×1 IMPLANT
GLOVE BIO SURGEON STRL SZ7 (GLOVE) ×2 IMPLANT
GLOVE BIOGEL PI IND STRL 7.0 (GLOVE) ×1 IMPLANT
GLOVE BIOGEL PI IND STRL 7.5 (GLOVE) ×1 IMPLANT
GLOVE BIOGEL PI INDICATOR 7.0 (GLOVE) ×1
GLOVE BIOGEL PI INDICATOR 7.5 (GLOVE) ×1
GLOVE ECLIPSE 6.5 STRL STRAW (GLOVE) ×2 IMPLANT
GOWN STRL REUS W/ TWL LRG LVL3 (GOWN DISPOSABLE) ×4 IMPLANT
GOWN STRL REUS W/TWL LRG LVL3 (GOWN DISPOSABLE) ×4
IV KIT MINILOC 20X1 SAFETY (NEEDLE) IMPLANT
KIT PORT POWER 8FR ISP CVUE (Catheter) ×2 IMPLANT
LIQUID BAND (GAUZE/BANDAGES/DRESSINGS) ×2 IMPLANT
MARKER SKIN DUAL TIP RULER LAB (MISCELLANEOUS) ×2 IMPLANT
NDL SAFETY ECLIPSE 18X1.5 (NEEDLE) IMPLANT
NEEDLE HYPO 18GX1.5 SHARP (NEEDLE)
NEEDLE HYPO 25X1 1.5 SAFETY (NEEDLE) ×2 IMPLANT
PACK BASIN DAY SURGERY FS (CUSTOM PROCEDURE TRAY) ×2 IMPLANT
PENCIL BUTTON HOLSTER BLD 10FT (ELECTRODE) ×2 IMPLANT
SLEEVE SCD COMPRESS KNEE MED (MISCELLANEOUS) ×2 IMPLANT
SPONGE GAUZE 4X4 12PLY STER LF (GAUZE/BANDAGES/DRESSINGS) ×2 IMPLANT
STAPLER VISISTAT 35W (STAPLE) ×2 IMPLANT
STRIP CLOSURE SKIN 1/2X4 (GAUZE/BANDAGES/DRESSINGS) ×2 IMPLANT
SUT MON AB 4-0 PC3 18 (SUTURE) ×2 IMPLANT
SUT PROLENE 2 0 SH DA (SUTURE) ×2 IMPLANT
SUT SILK 2 0 TIES 17X18 (SUTURE)
SUT SILK 2-0 18XBRD TIE BLK (SUTURE) IMPLANT
SUT VIC AB 3-0 SH 27 (SUTURE) ×1
SUT VIC AB 3-0 SH 27X BRD (SUTURE) ×1 IMPLANT
SYR 5ML LUER SLIP (SYRINGE) ×2 IMPLANT
SYR CONTROL 10ML LL (SYRINGE) ×2 IMPLANT
TOWEL OR 17X24 6PK STRL BLUE (TOWEL DISPOSABLE) ×2 IMPLANT
TOWEL OR NON WOVEN STRL DISP B (DISPOSABLE) ×2 IMPLANT
TUBE CONNECTING 20X1/4 (TUBING) IMPLANT
YANKAUER SUCT BULB TIP NO VENT (SUCTIONS) IMPLANT

## 2014-11-27 NOTE — Anesthesia Postprocedure Evaluation (Signed)
Anesthesia Post Note  Patient: Katie Jacobson  Procedure(s) Performed: Procedure(s) (LRB): INSERTION PORT-A-CATH (N/A)  Anesthesia type: general  Patient location: PACU  Post pain: Pain level controlled  Post assessment: Patient's Cardiovascular Status Stable  Last Vitals:  Filed Vitals:   11/27/14 1344  BP: 138/78  Pulse: 80  Temp: 36.5 C  Resp: 18    Post vital signs: Reviewed and stable  Level of consciousness: sedated  Complications: No apparent anesthesia complications

## 2014-11-27 NOTE — Op Note (Signed)
Preoperative diagnosis: Breast cancer, needs venous access Postoperative diagnosis: same as above Procedure: Left subclavian PowerPort insertion Surgeon: Dr. Serita Grammes Anesthesia: general with lma Estimated blood loss: Minimal Drains: None Counts gauge: None Sponge count was correct Disposition to recovery stable  Indications: This a 24yof who is scheduled to begin primary chemotherapy for breast cancer treatment. She is undergoing genetic testing and has a triple negative tumor.  We discussed risks prior to beginning.  Procedure: After informed consent was obtained the patient was taken to the operating room. She was given cefazolin. SCDs were placed. She was placed under general anesthesia. Arms were tucked and appropriately padded. She was then prepped and draped in a standard sterile surgical fashion. A surgical timeout was then performed.  I infiltrated Marcaine throughout the left chest wall as well as onto the clavicle. I accessed her subclavian vein on the first pass. The wire was placed. I then made a pocket overlying the pectoralis fascia. I then tunneled the line between the 2 sites. I then dilated the tract. I then inserted the peel-away sheath and dilator assembly and watched this go into position under fluoroscopy. I removed the wire assembly. I then placed the line. The peel-away sheath was removed. The line was pulled back to be in the cava.  I pulled back a little shorter as she was having ectopy. The port was then attached. This was secured with 2-0 Prolene in 2 places. I then flushed this and aspirated blood. I then closed this with 3-0 Vicryl and 4-0 Monocryl.This aspirated blood and I placed heparin in the port. Dermabond was placed over the wound. She tolerated this well as expected and transferred to recovery stable.

## 2014-11-27 NOTE — Transfer of Care (Signed)
Immediate Anesthesia Transfer of Care Note  Patient: Katie Jacobson  Procedure(s) Performed: Procedure(s): INSERTION PORT-A-CATH (N/A)  Patient Location: PACU  Anesthesia Type:General  Level of Consciousness: awake, alert  and oriented  Airway & Oxygen Therapy: Patient Spontanous Breathing and Patient connected to face mask oxygen  Post-op Assessment: Report given to RN and Post -op Vital signs reviewed and stable  Post vital signs: Reviewed and stable  Last Vitals:  Filed Vitals:   11/27/14 0939  BP: 135/70  Pulse: 75  Temp: 36.8 C  Resp: 20    Complications: No apparent anesthesia complications

## 2014-11-27 NOTE — Discharge Instructions (Signed)
° ° °PORT-A-CATH: POST OP INSTRUCTIONS ° °Always review your discharge instruction sheet given to you by the facility where your surgery was performed.  ° °1. A prescription for pain medication may be given to you upon discharge. Take your pain medication as prescribed, if needed. If narcotic pain medicine is not needed, then you make take acetaminophen (Tylenol) or ibuprofen (Advil) as needed.  °2. Take your usually prescribed medications unless otherwise directed. °3. If you need a refill on your pain medication, please contact our office. All narcotic pain medicine now requires a paper prescription.  Phoned in and fax refills are no longer allowed by law.  Prescriptions will not be filled after 5 pm or on weekends.  °4. You should follow a light diet for the remainder of the day after your procedure. °5. Most patients will experience some mild swelling and/or bruising in the area of the incision. It may take several days to resolve. °6. It is common to experience some constipation if taking pain medication after surgery. Increasing fluid intake and taking a stool softener (such as Colace) will usually help or prevent this problem from occurring. A mild laxative (Milk of Magnesia or Miralax) should be taken according to package directions if there are no bowel movements after 48 hours.  °7. Unless discharge instructions indicate otherwise, you may remove your bandages 48 hours after surgery, and you may shower at that time. You may have steri-strips (small white skin tapes) in place directly over the incision.  These strips should be left on the skin for 7-10 days.  If your surgeon used Dermabond (skin glue) on the incision, you may shower in 24 hours.  The glue will flake off over the next 2-3 weeks.  °8. If your port is left accessed at the end of surgery (needle left in port), the dressing cannot get wet and should only by changed by a healthcare professional. When the port is no longer accessed (when the  needle has been removed), follow step 7.   °9. ACTIVITIES:  Limit activity involving your arms for the next 72 hours. Do no strenuous exercise or activity for 1 week. You may drive when you are no longer taking prescription pain medication, you can comfortably wear a seatbelt, and you can maneuver your car. °10.You may need to see your doctor in the office for a follow-up appointment.  Please °      check with your doctor.  °11.When you receive a new Port-a-Cath, you will get a product guide and  °      ID card.  Please keep them in case you need them. ° °WHEN TO CALL YOUR DOCTOR ([phone]): °1. Fever over 101.0 °2. Chills °3. Continued bleeding from incision °4. Increased redness and tenderness at the site °5. Shortness of breath, difficulty breathing ° ° °The clinic staff is available to answer your questions during regular business hours. Please don’t hesitate to call and ask to speak to one of the nurses or medical assistants for clinical concerns. If you have a medical emergency, go to the nearest emergency room or call 911.  A surgeon from [facility] is always on call at the hospital.  ° ° ° °For further information, please visit [email or web address] ° ° °Post Anesthesia Home Care Instructions ° °Activity: °Get plenty of rest for the remainder of the day. A responsible adult should stay with you for 24 hours following the procedure.  °For the next 24 hours, DO NOT: °-Drive a car °-  Operate machinery °-Drink alcoholic beverages °-Take any medication unless instructed by your physician °-Make any legal decisions or sign important papers. ° °Meals: °Start with liquid foods such as gelatin or soup. Progress to regular foods as tolerated. Avoid greasy, spicy, heavy foods. If nausea and/or vomiting occur, drink only clear liquids until the nausea and/or vomiting subsides. Call your physician if vomiting continues. ° °Special Instructions/Symptoms: °Your throat may feel dry or sore from the  anesthesia or the breathing tube placed in your throat during surgery. If this causes discomfort, gargle with warm salt water. The discomfort should disappear within 24 hours. ° ° ° ° ° ° °

## 2014-11-27 NOTE — Interval H&P Note (Signed)
History and Physical Interval Note:  11/27/2014 10:00 AM  Katie Jacobson  has presented today for surgery, with the diagnosis of breast cancer  The various methods of treatment have been discussed with the patient and family. After consideration of risks, benefits and other options for treatment, the patient has consented to  Procedure(s): INSERTION PORT-A-CATH (N/A) as a surgical intervention .  The patient's history has been reviewed, patient examined, no change in status, stable for surgery.  I have reviewed the patient's chart and labs.  Questions were answered to the patient's satisfaction.     [name]

## 2014-11-27 NOTE — Anesthesia Procedure Notes (Signed)
Procedure Name: LMA Insertion Date/Time: 11/27/2014 10:57 AM Performed by: Melynda Ripple D Pre-anesthesia Checklist: Patient identified, Emergency Drugs available, Suction available and Patient being monitored Patient Re-evaluated:Patient Re-evaluated prior to inductionOxygen Delivery Method: Circle System Utilized Preoxygenation: Pre-oxygenation with 100% oxygen Intubation Type: IV induction Ventilation: Mask ventilation without difficulty LMA: LMA inserted LMA Size: 4.0 Number of attempts: 1 Airway Equipment and Method: Bite block Placement Confirmation: positive ETCO2 Tube secured with: Tape Dental Injury: Teeth and Oropharynx as per pre-operative assessment

## 2014-11-27 NOTE — Anesthesia Preprocedure Evaluation (Signed)
Anesthesia Evaluation  Patient identified by MRN, date of birth, ID band Patient awake    Reviewed: Allergy & Precautions, NPO status   Airway Mallampati: I  TM Distance: >3 FB Neck ROM: Full    Dental   Pulmonary Current Smoker,          Cardiovascular hypertension, Pt. on medications     Neuro/Psych    GI/Hepatic   Endo/Other    Renal/GU      Musculoskeletal   Abdominal   Peds  Hematology   Anesthesia Other Findings   Reproductive/Obstetrics                             Anesthesia Physical Anesthesia Plan  ASA: II  Anesthesia Plan: General   Post-op Pain Management:    Induction: Intravenous  Airway Management Planned: LMA  Additional Equipment:   Intra-op Plan:   Post-operative Plan: Extubation in OR  Informed Consent: I have reviewed the patients History and Physical, chart, labs and discussed the procedure including the risks, benefits and alternatives for the proposed anesthesia with the patient or authorized representative who has indicated his/her understanding and acceptance.     Plan Discussed with: CRNA and Surgeon  Anesthesia Plan Comments:         Anesthesia Quick Evaluation

## 2014-11-27 NOTE — H&P (Signed)
59 yof who underwent screening mm that showed a right breast uoq mass. On [place] this measures 9 mmx 1.1cmx 1.2cm. Her breast density is B. US of the axilla is negative. She has undergone core biopsy that shows a grade II IDC that is triple negative with a Ki of 20%. She has fh of breast cancer in a maternal cousin at age 57. She has no prior breast complaints. She has no masses or discharge. She is a smoker.   Other Problems Francee Nodal, RN; 11/19/2014 9:13 AM) Arthritis Atrial Fibrillation Back Pain Breast Cancer Chest pain Heart murmur High blood pressure Kidney Stone Lump In Breast Other disease, cancer, significant illness Seizure Disorder  Past Surgical History Francee Nodal, RN; 11/19/2014 9:13 AM) Appendectomy Breast Biopsy Right. Cesarean Section - 1 Foot Surgery Left. Hysterectomy (not due to cancer) - Partial Knee Surgery Left. Oral Surgery Tonsillectomy  Diagnostic Studies History Francee Nodal, RN; 11/19/2014 9:13 AM) Colonoscopy 1-5 years ago Mammogram within last year Pap Smear 1-5 years ago  Social History Francee Nodal, RN; 11/19/2014 9:13 AM) Alcohol use Moderate alcohol use. No caffeine use No drug use Tobacco use Current every day smoker.  Family History Francee Nodal, RN; 11/19/2014 9:13 AM) Breast Cancer Family Members In General. Cerebrovascular Accident Mother. Colon Polyps Daughter. Diabetes Mellitus Brother, Sister. Heart Disease Mother. Heart disease in female family member before age 88 Hypertension Mother. Kidney Disease Brother.  Pregnancy / Birth History Francee Nodal, RN; 11/19/2014 9:13 AM) Age at menarche 42 years. Age of menopause <47 Contraceptive History Oral contraceptives. Gravida 2 Irregular periods Maternal age 81-30 Para 2  Review of Systems Francee Nodal RN; 11/19/2014 9:13 AM) General Present- Appetite Loss. Not Present- Chills, Fatigue, Fever, Night Sweats, Weight Gain  and Weight Loss. HEENT Present- Visual Disturbances and Wears glasses/contact lenses. Not Present- Earache, Hearing Loss, Hoarseness, Nose Bleed, Oral Ulcers, Ringing in the Ears, Seasonal Allergies, Sinus Pain, Sore Throat and Yellow Eyes. Respiratory Not Present- Bloody sputum, Chronic Cough, Difficulty Breathing, Snoring and Wheezing. Breast Present- Breast Mass and Breast Pain. Not Present- Nipple Discharge and Skin Changes. Cardiovascular Not Present- Chest Pain, Difficulty Breathing Lying Down, Leg Cramps, Palpitations, Rapid Heart Rate, Shortness of Breath and Swelling of Extremities. Gastrointestinal Not Present- Abdominal Pain, Bloating, Bloody Stool, Change in Bowel Habits, Chronic diarrhea, Constipation, Difficulty Swallowing, Excessive gas, Gets full quickly at meals, Hemorrhoids, Indigestion, Nausea, Rectal Pain and Vomiting. Female Genitourinary Present- Frequency. Not Present- Nocturia, Painful Urination, Pelvic Pain and Urgency. Musculoskeletal Present- Back Pain and Joint Pain. Not Present- Joint Stiffness, Muscle Pain, Muscle Weakness and Swelling of Extremities. Neurological Not Present- Decreased Memory, Fainting, Headaches, Numbness, Seizures, Tingling, Tremor, Trouble walking and Weakness. Psychiatric Present- Anxiety. Not Present- Bipolar, Change in Sleep Pattern, Depression, Fearful and Frequent crying.   Physical Exam Rolm Bookbinder MD; 11/19/2014 8:44 PM) General Mental Status-Alert. Orientation-Oriented X3.  Chest and Lung Exam Chest and lung exam reveals -on auscultation, normal breath sounds, no adventitious sounds and normal vocal resonance.  Breast Nipples-No Discharge. Breast Lump-No Palpable Breast Mass.  Lymphatic Head & Neck  General Head & Neck Lymphatics: Bilateral - Description - Normal. Axillary  General Axillary Region: Bilateral - Description - Normal. Note: no [place] adenopathy     Assessment & Plan Rolm Bookbinder MD;  11/19/2014 8:46 PM) STAGE I BREAST CANCER, RIGHT (174.9  C50.911) Story: Genetic testing due to fh and age/tnbc, MRI to follow with primary chemotherapy, port placement, appears to be good lump/sn candidate in future but with awaiting genetics  results we have decided to begin with chemo. We discussed the staging and pathophysiology of breast cancer. We discussed all of the different options for treatment for breast cancer including surgery, chemotherapy, radiation therapy, Herceptin, and antiestrogen therapy. We discussed a sentinel lymph node biopsy as she does not appear to having lymph node involvement right now. We discussed lumpectomy vs mastectomy also. I discussed port placement and risks involved which will plan next week.

## 2014-11-28 ENCOUNTER — Telehealth: Payer: Self-pay | Admitting: *Deleted

## 2014-11-28 ENCOUNTER — Encounter (HOSPITAL_BASED_OUTPATIENT_CLINIC_OR_DEPARTMENT_OTHER): Payer: Self-pay | Admitting: General Surgery

## 2014-11-28 ENCOUNTER — Ambulatory Visit (HOSPITAL_BASED_OUTPATIENT_CLINIC_OR_DEPARTMENT_OTHER): Payer: BLUE CROSS/BLUE SHIELD | Admitting: Oncology

## 2014-11-28 VITALS — BP 142/78 | HR 79 | Temp 97.6°F | Resp 18 | Ht 61.0 in | Wt 219.5 lb

## 2014-11-28 DIAGNOSIS — I341 Nonrheumatic mitral (valve) prolapse: Secondary | ICD-10-CM

## 2014-11-28 DIAGNOSIS — Z720 Tobacco use: Secondary | ICD-10-CM

## 2014-11-28 DIAGNOSIS — E669 Obesity, unspecified: Secondary | ICD-10-CM

## 2014-11-28 DIAGNOSIS — I10 Essential (primary) hypertension: Secondary | ICD-10-CM

## 2014-11-28 DIAGNOSIS — Z8669 Personal history of other diseases of the nervous system and sense organs: Secondary | ICD-10-CM

## 2014-11-28 DIAGNOSIS — C50411 Malignant neoplasm of upper-outer quadrant of right female breast: Secondary | ICD-10-CM

## 2014-11-28 DIAGNOSIS — E785 Hyperlipidemia, unspecified: Secondary | ICD-10-CM

## 2014-11-28 MED ORDER — DEXAMETHASONE 4 MG PO TABS: 8.0000 mg | ORAL_TABLET | Freq: Two times a day (BID) | ORAL | Status: DC

## 2014-11-28 MED ORDER — ONDANSETRON HCL 8 MG PO TABS: 8.0000 mg | ORAL_TABLET | Freq: Two times a day (BID) | ORAL | Status: DC

## 2014-11-28 MED ORDER — LORAZEPAM 0.5 MG PO TABS: 0.5000 mg | ORAL_TABLET | Freq: Every day | ORAL | Status: DC

## 2014-11-28 MED ORDER — LIDOCAINE-PRILOCAINE 2.5-2.5 % EX CREA: TOPICAL_CREAM | CUTANEOUS | Status: DC

## 2014-11-28 MED ORDER — PROCHLORPERAZINE MALEATE 10 MG PO TABS: 10.0000 mg | ORAL_TABLET | Freq: Four times a day (QID) | ORAL | Status: DC | PRN

## 2014-11-28 NOTE — Progress Notes (Signed)
[facility]  Telephone:([phone]) [phone] Fax:(336) [phone]     ID: Katie Jacobson DOB: 15-Jan-1956  MR#: [id number]  [facility]#:[id number]  Patient Care Team: Debbrah Alar, NP as PCP - General (Internal Medicine) Gus Height, MD (Obstetrics and Gynecology) Rolm Bookbinder, MD as Consulting Physician (General Surgery) Chauncey Cruel, MD as Consulting Physician (Oncology) Eppie Gibson, MD as Attending Physician (Radiation Oncology) Roselee Culver, RN as Registered Nurse [place], RN as Registered Nurse Trinda Pascal, NP as Nurse Practitioner (Nurse Practitioner) OTHER MD:  CHIEF COMPLAINT: Triple negative breast cancer  CURRENT TREATMENT: Neoadjuvant chemotherapy   BREAST CANCER HISTORY: From the original intake note:  The patient herself palpated a mass in her right breast there is she brought it to her gynecologist, Dr. Alan Ripper attention and he obtained screening mammography which suggested an abnormality in the right breast. On 10/24/2014 the patient underwent right diagnostic mammography and ultrasonography at the breast Center. The breast density was category B. There was an ill-defined mass in the upper outer quadrant of the right breast which was palpable on exam. By ultrasound this was irregular and hypoechoic measuring 1.2 cm. Evaluation of the right axilla was negative.  Biopsy of the mass in question showed an invasive ductal carcinoma, grade 2 or 3, triple negative, with an MIB-1 of 19%.  Katie Jacobson case was presented at the multidisciplinary breast cancer conference 11/19/2014, where was suggested she would benefit from genetics testing. Since this would mandate a significant delay in her final surgery, neoadjuvant chemotherapy was indicated.  The patient's subsequent history is as detailed below  INTERVAL HISTORY: Katie Jacobson returns today for follow-up of her breast cancer accompanied by her sister. Since her last visit here  she had her port placed. She is taking some oxycodone for this and it is constipating her and make in her little bit nauseated. She is "working at quitting smoking" and has only had 3 cigarettes so far today. She is scheduled to start her chemotherapy 12/02/2014.  REVIEW OF SYSTEMS: She has been a little anxious, but has been able to sleep okay despite that. There have been no new symptoms since the last visit here. A detailed review of systems today was noncontributory except as noted above  PAST MEDICAL HISTORY: Past Medical History  Diagnosis Date  . Heart palpitations   . Osteoporosis   . Essential hypertension 04/01/2011  . History of partial seizures 1970    never knew why  . Mitral valve prolapse 1990  . Personal history of kidney stones 05/2012  . Obesity (BMI 35.0-39.9 without comorbidity)   . Breast cancer of upper-outer quadrant of right female breast 11/14/2014  . Family history of breast cancer     PAST SURGICAL HISTORY: Past Surgical History  Procedure Laterality Date  . Appendectomy  1976  . Abdominal hysterectomy  1998    ovaries spared  . Tonsillectomy  1970s  . Knee arthroscopy  2006    left  knee  . Cystectomy      Pt reports removal of a "stone" from the right side of her throat as a child  . Tubal ligation  1996  . Foot sx      X2  . Doppler echocardiography  03/27/1989    suggestive eidence of mitral valve proplapse miminal ; trace mitral regurgitation  . Nm myoview ltd  07/01/1996    EF 62%  . Salivary stone removal  1972    right neck  . Colonoscopy  FAMILY HISTORY Family History  Problem Relation Age of Onset  . Heart disease Mother   . Stroke Mother   . Hypertension Mother   . Diabetes Mother   . Diabetes Sister   . Kidney disease Brother   . Mental retardation Brother   . Breast cancer Cousin     deceased 71  . Diabetes Sister   . Colon cancer Neg Hx   . Esophageal cancer Neg Hx   . Stomach cancer Neg Hx   . Heart attack Father     the patient knows little about her father. Her mother died at at the age of 70 following a stroke. The patient had 2 brothers, 2 sisters. The only history of breast or ovarian cancer in the family is a first cousin on the mother's side diagnosed with breast cancer at age 13  GYNECOLOGIC HISTORY:  Patient's last menstrual period was 10/10/1996. Menarche age 59, first live birth age 76. The patient is GX P2. She status post simple hysterectomy without salpingo-oophorectomy. She did not take hormone replacement.  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 The patient's husband, Jori Moll "Ron" Netzer works for [employer] in the pattern shop. He also works as a Dispensing optician. Daughter Adilen Pavelko lives in [place] and works in Programmer, applications. Daughter [name] Palazzola lives in [place] and works at a [employer]. 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    ADVANCED DIRECTIVES: Not in place   HEALTH MAINTENANCE: History  Substance Use Topics  . Smoking status: Current Every Day Smoker -- 0.50 packs/day for 39 years    Types: Cigarettes  . Smokeless tobacco: Never Used  . Alcohol Use: 4.2 oz/week    7 Cans of beer per week     Colonoscopy:  PAP:  Bone density:  Lipid panel:  Allergies  Allergen Reactions  . Sulfa Antibiotics Other (See Comments)    Unknown reaction    Current Outpatient Prescriptions  Medication Sig Dispense Refill  . albuterol (PROVENTIL HFA;VENTOLIN HFA) 108 (90 BASE) MCG/ACT inhaler Inhale into the lungs every 6 (six) hours as needed for wheezing or shortness of breath.    . chlorthalidone (HYGROTON) 25 MG tablet TAKE 1 TABLET BY MOUTH EVERY DAY 30 tablet 0  . dexamethasone (DECADRON) 4 MG tablet Take 2 tablets (8 mg total) by mouth 2 (two) times daily. Start the day before Taxotere. Then again the day after chemo for 3 days. 30 tablet 1  . enalapril (VASOTEC) 20 MG tablet Take 1 tablet (20 mg total)  by mouth at bedtime. 30 tablet 11  . fluticasone (FLONASE) 50 MCG/ACT nasal spray Place 1 spray into both nostrils as needed.    . lidocaine-prilocaine (EMLA) cream Apply to affected area once 30 g 3  . LORazepam (ATIVAN) 0.5 MG tablet Take 1 tablet (0.5 mg total) by mouth at bedtime. 30 tablet 0  . metoprolol succinate (TOPROL-XL) 50 MG 24 hr tablet Take 1 tablet (50 mg total) by mouth at bedtime. 30 tablet 11  . ondansetron (ZOFRAN) 8 MG tablet Take 1 tablet (8 mg total) by mouth 2 (two) times daily. Start the day after chemo for 3 days. Then take as needed for nausea or vomiting. 30 tablet 1  . oxyCODONE-acetaminophen (PERCOCET) 10-325 MG per tablet Take 1 tablet by mouth every 6 (six) hours as needed for pain. 10 tablet 0  . prochlorperazine (COMPAZINE) 10 MG tablet Take 1  tablet (10 mg total) by mouth every 6 (six) hours as needed (Nausea or vomiting). 30 tablet 1   No current facility-administered medications for this visit.    OBJECTIVE: Middle-aged African-American woman in no acute distress Filed Vitals:   11/28/14 1619  BP: 142/78  Pulse: 79  Temp: 97.6 F (36.4 C)  Resp: 18     Body mass index is 41.5 kg/(m^2).    ECOG FS:0 - Asymptomatic  Ocular: Sclerae unicteric, EOMs intact Ear-nose-throat: Oropharynx clear, dentition in good repair Lymphatic: No cervical or supraclavicular adenopathy Lungs no rales or rhonchi Heart regular rate and rhythm Abd soft, nontender, positive bowel sounds MSK no focal spinal tenderness, no upper extremity lymphedema Neuro: non-focal, well-oriented, appropriate affect Breasts: Deferred Skin: The port is easily palpable in the upper anterior left chest wall  LAB RESULTS:  CMP     Component Value Date/Time   NA 143 11/19/2014 1241   NA 137 09/08/2013 1120   K 3.4* 11/19/2014 1241   K 3.6 09/08/2013 1120   CL 103 09/08/2013 1120   CO2 28 11/19/2014 1241   CO2 21 09/08/2013 1120   GLUCOSE 82 11/19/2014 1241   GLUCOSE 89 09/08/2013  1120   BUN 15.6 11/19/2014 1241   BUN 9 09/08/2013 1120   CREATININE 0.9 11/19/2014 1241   CREATININE 0.85 09/08/2013 1120   CREATININE 0.93 09/02/2013 1128   CALCIUM 9.3 11/19/2014 1241   CALCIUM 9.4 09/08/2013 1120   PROT 7.1 11/19/2014 1241   PROT 7.0 09/08/2013 1120   ALBUMIN 3.5 11/19/2014 1241   ALBUMIN 3.4* 09/08/2013 1120   AST 15 11/19/2014 1241   AST 15 09/08/2013 1120   ALT 13 11/19/2014 1241   ALT 12 09/08/2013 1120   ALKPHOS 73 11/19/2014 1241   ALKPHOS 80 09/08/2013 1120   BILITOT 0.43 11/19/2014 1241   BILITOT 0.5 09/08/2013 1120   GFRNONAA 76* 09/08/2013 1120   GFRNONAA 70 09/02/2013 1128   GFRAA 88* 09/08/2013 1120   GFRAA 81 09/02/2013 1128    INo results found for: SPEP, UPEP  Lab Results  Component Value Date   WBC 6.8 11/19/2014   NEUTROABS 3.9 11/19/2014   HGB 13.9 11/19/2014   HCT 42.9 11/19/2014   MCV 92.5 11/19/2014   PLT 220 11/19/2014      Chemistry      Component Value Date/Time   NA 143 11/19/2014 1241   NA 137 09/08/2013 1120   K 3.4* 11/19/2014 1241   K 3.6 09/08/2013 1120   CL 103 09/08/2013 1120   CO2 28 11/19/2014 1241   CO2 21 09/08/2013 1120   BUN 15.6 11/19/2014 1241   BUN 9 09/08/2013 1120   CREATININE 0.9 11/19/2014 1241   CREATININE 0.85 09/08/2013 1120   CREATININE 0.93 09/02/2013 1128      Component Value Date/Time   CALCIUM 9.3 11/19/2014 1241   CALCIUM 9.4 09/08/2013 1120   ALKPHOS 73 11/19/2014 1241   ALKPHOS 80 09/08/2013 1120   AST 15 11/19/2014 1241   AST 15 09/08/2013 1120   ALT 13 11/19/2014 1241   ALT 12 09/08/2013 1120   BILITOT 0.43 11/19/2014 1241   BILITOT 0.5 09/08/2013 1120       No results found for: LABCA2  No components found for: LABCA125  No results for input(s): INR in the last 168 hours.  Urinalysis    Component Value Date/Time   COLORURINE YELLOW 09/08/2013 [place] 09/08/2013 1258   LABSPEC 1.019 09/08/2013 1258  PHURINE 8.0 09/08/2013 1258   GLUCOSEU  NEGATIVE 09/08/2013 1258   HGBUR TRACE* 09/08/2013 [place] 09/08/2013 1258   KETONESUR 40* 09/08/2013 1258   PROTEINUR NEGATIVE 09/08/2013 1258   UROBILINOGEN 0.2 09/08/2013 1258   NITRITE NEGATIVE 09/08/2013 1258   LEUKOCYTESUR NEGATIVE 09/08/2013 1258    STUDIES: Mr Breast Bilateral W Wo Contrast  11/21/2014   CLINICAL DATA:  59 year old female with recently diagnosed invasive ductal carcinoma post ultrasound-guided biopsy of a mass in the upper right breast at 11 o'clock.  LABS:  Not applicable.  EXAM: BILATERAL BREAST MRI WITH AND WITHOUT CONTRAST  TECHNIQUE: Multiplanar, multisequence MR images of both breasts were obtained prior to and following the intravenous administration of 20 ml of Multihance.  THREE-DIMENSIONAL MR IMAGE RENDERING ON INDEPENDENT WORKSTATION:  Three-dimensional MR images were rendered by post-processing of the original MR data on an independent workstation. The three-dimensional MR images were interpreted, and findings are reported in the following complete MRI report for this study. Three dimensional images were evaluated at the independent DynaCad workstation  COMPARISON:  Previous exam(s).  FINDINGS: Breast composition: b.  Scattered fibroglandular tissue.  Background parenchymal enhancement: Mild.  Right breast: Irregular enhancing mass in the upper right breast at 11-12 o'clock containing biopsy clip artifact compatible with biopsy proven malignancy measures 1.3 x 1.2 x 0.8 cm. A 1 cm oval well-circumscribed high T2 signal enhancing mass is present 2.5 cm posterior medial to the dominant corresponding to a mass which has been mammographically stable since at least 09/12/2013, likely a fibroadenoma. No additional suspicious rapidly enhancing masses or abnormal areas of enhancement seen in the right breast to suggest additional sites of disease.  Left breast: No suspicious rapidly enhancing masses or abnormal areas of enhancement seen in the left breast  to suggest malignancy.  Lymph nodes: No morphologically abnormal axillary lymph nodes. No internal mammary lymphadenopathy.  Ancillary findings:  None.  IMPRESSION: 1. Biopsy proven malignancy in the superior right breast measures up to 1.3 cm. No suspicious lymphadenopathy or findings to suggest multifocal or multicentric disease.  2. Benign circumscribed oval enhancing mass located posterior medial to the biopsy proven malignancy corresponds with a mammographically stable mass, with imaging features most suggestive of a fibroadenoma.  RECOMMENDATION: Treatment plan for known right breast malignancy.  BI-RADS CATEGORY  6: Known biopsy-proven malignancy.   Electronically Signed   By: Everlean Alstrom M.D.   On: 11/21/2014 13:49   Dg Chest Port 1 View  11/27/2014   CLINICAL DATA:  Postop.  EXAM: PORTABLE CHEST - 1 VIEW  COMPARISON:  03/25/2008  FINDINGS: Study dictated in exception status due to stat indication and clinician request. Technologist confirms correct patient and image.  Left subclavian porta catheter, tip at the SVC level.  There is mild pulmonary venous congestion which is likely from supine positioning. No pneumothorax or mediastinal widening. No evidence of collapse or aspiration.  Normal heart size and mediastinal contours for technique.  IMPRESSION: A left subclavian central line is in good position. No pneumothorax.   Electronically Signed   By: Monte Fantasia M.D.   On: 11/27/2014 12:17   Mm Digital Diagnostic Unilat R  11/13/2014   ADDENDUM REPORT: 11/13/2014 14:08  ADDENDUM: PATHOLOGY ADDENDUM:  Pathology right breast mass: Invasive ductal carcinoma  Pathology concordance with imaging findings: Yes  Recommendation: Consultation regarding treatment plan. Multidisciplinary clinic appointment is scheduled for the patient on 11/19/2014. MRI appointment can be scheduled if needed.  At the request of the patient,  I spoke with her by telephone on 11/13/2014 at 14:08. She reports doing well after  the biopsy .   Electronically Signed   By: Shon Hale M.D.   On: 11/13/2014 14:08   11/13/2014   CLINICAL DATA:  Status post ultrasound-guided core biopsy of mass in the 11 o'clock location of right breast.  EXAM: DIAGNOSTIC RIGHT MAMMOGRAM POST ULTRASOUND BIOPSY  COMPARISON:  Previous exam(s).  FINDINGS: Mammographic images were obtained following ultrasound guided biopsy of mass in the 11 o'clock location of the right breast. A coil shaped clip is identified in the upper-outer quadrant of the right breast as expected.  IMPRESSION: Tissue marker clip is in expected location following biopsy.  Final Assessment: Post Procedure Mammograms for Marker Placement  Electronically Signed: By: Shon Hale M.D. On: 11/12/2014 15:19   Dg C-arm 1-60 Min-no Report  11/27/2014   CLINICAL DATA: cv acess   C-ARM 1-60 MINUTES  Fluoroscopy was utilized by the requesting physician.  No radiographic  interpretation.    [place] Rt Breast Bx W Loc Dev 1st Lesion Img Bx Spec US Guide  11/12/2014   CLINICAL DATA:  Patient presents for ultrasound-guided core biopsy of mass in the 11 o'clock location of the right breast.  EXAM: ULTRASOUND GUIDED RIGHT BREAST CORE NEEDLE BIOPSY  COMPARISON:  Previous exam(s).  PROCEDURE: I met with the patient and we discussed the procedure of ultrasound-guided biopsy, including benefits and alternatives. We discussed the high likelihood of a successful procedure. We discussed the risks of the procedure including infection, bleeding, tissue injury, clip migration, and inadequate sampling. Informed written consent was given. The usual time-out protocol was performed immediately prior to the procedure.  Using sterile technique and 2% Lidocaine as local anesthetic, under direct ultrasound visualization, a 12 gauge vacuum-assisted device was used to perform biopsy of mass in the 11 o'clock location of the right breastusing a caudal approach. At the conclusion of the procedure, a coil shaped tissue marker clip  was deployed into the biopsy cavity. Follow-up 2-view mammogram was performed and dictated separately.  IMPRESSION: Ultrasound-guided biopsy of right breast mass. No apparent complications.   Electronically Signed   By: Shon Hale M.D.   On: 11/12/2014 15:02    ASSESSMENT: 59 y.o. [place], [place] woman status post right breast upper outer quadrant biopsy 11/12/2014 for a clinical T1c N0, stage IA invasive ductal carcinoma, triple negative, with an MIB-1 of 19%  (1) neoadjuvant chemotherapy will consist of cyclophosphamide and docetaxel 4 given every 21 days with Neulasta support, first dose 12/02/2014  (2) genetics testing pending  (3) surgery to be guided by genetics results, and to take place at the conclusion of chemotherapy  (4) radiation to follow surgery  (5) continuing tobacco abuse. The patient has been strongly advised to quit smoking  PLAN: I spent approximately 40 minutes with Katie Jacobson and her sister going over her antinausea and other supportive medicines. I put in all the prescriptions to her pharmacy and reminded her that she will have to buy the Claritin separately since it is over-the-counter. I gave her a "roadmap" on how to take these medications, and I went over it with her in detail so both she and her sister understand exactly how to follow those instructions.  In particular she understands that she needs to start dexamethasone the day before chemotherapy. If there is any discrepancy between the instructions on the "roadmap" and those on the medicine bottle she will follow the roadmap.   She will  not have to return here on day 2 for Neulasta because we will use the "Neulasta patch", which will be placed as she completes her chemotherapy and inject are automatically 24 hours later.  She is significantly cutting back on her tobacco abuse, down to about 4 cigarettes a day. I again urged her to stop completely. I assured her that if she does she will notice within 2  weeks that her breathing is considerably better. I also encouraged her to start a walking exercise. She has a stationary bike and treadmill at home. Unfortunately there are not where she can use them. She is going to move them to their family room in front of the TV, which will make them more accessible.  She will start treatment on Tuesday, and we will see her a week later to make sure she tolerated it well and to make any changes necessary Strother her subsequent treatments are easier than the first. She has a good understanding of the overall plan. She agrees with it. She knows the goal of treatment in her case is cure. She will call with any problems that may develop before her next visit here.  Chauncey Cruel, MD   11/28/2014 4:54 PM Medical Oncology and Hematology [facility] [address] [place], [place] [address] Tel. [phone]    Fax. [phone]

## 2014-11-28 NOTE — Telephone Encounter (Signed)
Spoke to patient from [facility] 11/19/14.  She is doing well. She had her port placed yesterday.  She is aware of her appointment with Dr. Jana Hakim this afternoon at 4pm.  Encouraged patient to call with any concerns or needs.

## 2014-12-01 ENCOUNTER — Other Ambulatory Visit: Payer: Self-pay | Admitting: *Deleted

## 2014-12-01 ENCOUNTER — Telehealth: Payer: Self-pay | Admitting: *Deleted

## 2014-12-01 ENCOUNTER — Telehealth: Payer: Self-pay | Admitting: Oncology

## 2014-12-01 MED ORDER — TOBRAMYCIN-DEXAMETHASONE 0.3-0.1 % OP SUSP: 1.0000 [drp] | Freq: Four times a day (QID) | OPHTHALMIC | Status: DC

## 2014-12-01 NOTE — Telephone Encounter (Signed)
[facility] PHONE [phone]

## 2014-12-01 NOTE — Telephone Encounter (Signed)
lvm for pt regarding added appt.Marland KitchenMarland KitchenMarland KitchenMarland Kitchenpt will get new sched tomorrow

## 2014-12-02 ENCOUNTER — Encounter: Payer: Self-pay | Admitting: *Deleted

## 2014-12-02 ENCOUNTER — Other Ambulatory Visit (HOSPITAL_BASED_OUTPATIENT_CLINIC_OR_DEPARTMENT_OTHER): Payer: BLUE CROSS/BLUE SHIELD

## 2014-12-02 ENCOUNTER — Other Ambulatory Visit: Payer: BLUE CROSS/BLUE SHIELD

## 2014-12-02 ENCOUNTER — Ambulatory Visit (HOSPITAL_BASED_OUTPATIENT_CLINIC_OR_DEPARTMENT_OTHER): Payer: BLUE CROSS/BLUE SHIELD

## 2014-12-02 ENCOUNTER — Other Ambulatory Visit: Payer: Self-pay | Admitting: Oncology

## 2014-12-02 DIAGNOSIS — Z5111 Encounter for antineoplastic chemotherapy: Secondary | ICD-10-CM

## 2014-12-02 DIAGNOSIS — C50411 Malignant neoplasm of upper-outer quadrant of right female breast: Secondary | ICD-10-CM

## 2014-12-02 LAB — COMPREHENSIVE METABOLIC PANEL (CC13)
ALT: 13 U/L (ref 0–55)
ANION GAP: 9 meq/L (ref 3–11)
AST: 10 U/L (ref 5–34)
Albumin: 3.3 g/dL — ABNORMAL LOW (ref 3.5–5.0)
Alkaline Phosphatase: 67 U/L (ref 40–150)
BUN: 14.6 mg/dL (ref 7.0–26.0)
CALCIUM: 9.8 mg/dL (ref 8.4–10.4)
CO2: 25 mEq/L (ref 22–29)
Chloride: 107 mEq/L (ref 98–109)
Creatinine: 0.9 mg/dL (ref 0.6–1.1)
EGFR: 89 mL/min/{1.73_m2} — ABNORMAL LOW (ref >90)
Glucose: 123 mg/dl (ref 70–140)
Potassium: 4.3 mEq/L (ref 3.5–5.1)
SODIUM: 142 meq/L (ref 136–145)
Total Bilirubin: 0.28 mg/dL (ref 0.20–1.20)
Total Protein: 6.9 g/dL (ref 6.4–8.3)

## 2014-12-02 LAB — CBC WITH DIFFERENTIAL/PLATELET
BASO%: 0 % (ref 0.0–2.0)
BASOS ABS: 0 10*3/uL (ref 0.0–0.1)
EOS%: 0 % (ref 0.0–7.0)
Eosinophils Absolute: 0 10*3/uL (ref 0.0–0.5)
HCT: 40.7 % (ref 34.8–46.6)
HGB: 13.6 g/dL (ref 11.6–15.9)
LYMPH%: 7.5 % — ABNORMAL LOW (ref 14.0–49.7)
MCH: 30.8 pg (ref 25.1–34.0)
MCHC: 33.4 g/dL (ref 31.5–36.0)
MCV: 92.3 fL (ref 79.5–101.0)
MONO#: 0.1 10*3/uL (ref 0.1–0.9)
MONO%: 0.8 % (ref 0.0–14.0)
NEUT%: 91.7 % — AB (ref 38.4–76.8)
NEUTROS ABS: 9.3 10*3/uL — AB (ref 1.5–6.5)
PLATELETS: 195 10*3/uL (ref 145–400)
RBC: 4.41 10*6/uL (ref 3.70–5.45)
RDW: 15.1 % — ABNORMAL HIGH (ref 11.2–14.5)
WBC: 10.1 10*3/uL (ref 3.9–10.3)
lymph#: 0.8 10*3/uL — ABNORMAL LOW (ref 0.9–3.3)

## 2014-12-02 MED ORDER — DEXAMETHASONE SODIUM PHOSPHATE 20 MG/5ML IJ SOLN
INTRAMUSCULAR | Status: AC
  Filled 2014-12-02: qty 5

## 2014-12-02 MED ORDER — SODIUM CHLORIDE 0.9 % IV SOLN
600.0000 mg/m2 | Freq: Once | INTRAVENOUS | Status: AC
  Administered 2014-12-02: 1240 mg via INTRAVENOUS
  Filled 2014-12-02: qty 62

## 2014-12-02 MED ORDER — ONDANSETRON 16 MG/50ML IVPB (CHCC)
16.0000 mg | Freq: Once | INTRAVENOUS | Status: AC
  Administered 2014-12-02: 16 mg via INTRAVENOUS

## 2014-12-02 MED ORDER — SODIUM CHLORIDE 0.9 % IV SOLN
Freq: Once | INTRAVENOUS | Status: AC
  Administered 2014-12-02: 09:00:00 via INTRAVENOUS

## 2014-12-02 MED ORDER — ONDANSETRON 16 MG/50ML IVPB (CHCC)
INTRAVENOUS | Status: AC
  Filled 2014-12-02: qty 16

## 2014-12-02 MED ORDER — SODIUM CHLORIDE 0.9 % IJ SOLN
10.0000 mL | INTRAMUSCULAR | Status: DC | PRN
  Administered 2014-12-02: 10 mL
  Filled 2014-12-02: qty 10

## 2014-12-02 MED ORDER — DEXAMETHASONE SODIUM PHOSPHATE 20 MG/5ML IJ SOLN
20.0000 mg | Freq: Once | INTRAMUSCULAR | Status: AC
  Administered 2014-12-02: 20 mg via INTRAVENOUS

## 2014-12-02 MED ORDER — HEPARIN SOD (PORK) LOCK FLUSH 100 UNIT/ML IV SOLN
500.0000 [IU] | Freq: Once | INTRAVENOUS | Status: AC | PRN
  Administered 2014-12-02: 500 [IU]
  Filled 2014-12-02: qty 5

## 2014-12-02 MED ORDER — DOCETAXEL CHEMO INJECTION 160 MG/16ML
75.0000 mg/m2 | Freq: Once | INTRAVENOUS | Status: AC
  Administered 2014-12-02: 150 mg via INTRAVENOUS
  Filled 2014-12-02: qty 15

## 2014-12-02 NOTE — Progress Notes (Signed)
Pt completed first time Taxotere, Cytoxan chemo without problems.  Pt viewed video for onpro for Neulasta.  Pt decided to come back on day 2 for Neulasta injection due to increased anxiety at this time.  Pt stated she would try Onpro with second cycle of chemo.  Injection appt adjusted for 24 hours after chemo.

## 2014-12-02 NOTE — Progress Notes (Signed)
Spoke with patient today in chemo for her 1st treatment.  She is doing well.  She seems so positive and was very thankful and grateful for the wonderful care she had received so far.  No questions or concerns at this time.  Encouraged her to call with any needs or concerns.

## 2014-12-02 NOTE — Patient Instructions (Addendum)
[facility] Discharge Instructions for Patients Receiving Chemotherapy  Today you received the following chemotherapy agents :  Taxotere, Cytoxan.  To help prevent nausea and vomiting after your treatment, we encourage you to take your nausea medication as prescribed.  Take Decadron 8mg  by mouth  Twice daily  For  3  Days  - start  Day after chemo;  Take Zofran 8mg  by mouth  Twice  Daily  For 3  Days  -  Start day after chemo , and as needed for nausea.  Can also take  Compazine 10mg  by mouth every 6 hours as needed for nausea. DO NOT Drive after taking  Compazine or  Ativan  Since these medications can cause drowsiness. DO Drink  Lots  Of  Fluids  As  Tolerated.  DO NOT Eat  Greasy  Nor  Spicy  Foods.     If you develop nausea and vomiting that is not controlled by your nausea medication, call the clinic.   BELOW ARE SYMPTOMS THAT SHOULD BE REPORTED IMMEDIATELY:  *FEVER GREATER THAN 100.5 F  *CHILLS WITH OR WITHOUT FEVER  NAUSEA AND VOMITING THAT IS NOT CONTROLLED WITH YOUR NAUSEA MEDICATION  *UNUSUAL SHORTNESS OF BREATH  *UNUSUAL BRUISING OR BLEEDING  TENDERNESS IN MOUTH AND THROAT WITH OR WITHOUT PRESENCE OF ULCERS  *URINARY PROBLEMS  *BOWEL PROBLEMS  UNUSUAL RASH Items with * indicate a potential emergency and should be followed up as soon as possible.  Feel free to call the clinic you have any questions or concerns. The clinic phone number is [phone].   Pegfilgrastim injection What is this medicine? PEGFILGRASTIM (peg fil GRA stim) is a long-acting granulocyte colony-stimulating factor that stimulates the growth of neutrophils, a type of white blood cell important in the body's fight against infection. It is used to reduce the incidence of fever and infection in patients with certain types of cancer who are receiving chemotherapy that affects the bone marrow. This medicine may be used for other purposes; ask your health care provider or pharmacist if  you have questions. COMMON BRAND NAME(S): Neulasta What should I tell my health care provider before I take this medicine? They need to know if you have any of these conditions: -latex allergy -ongoing radiation therapy -sickle cell disease -skin reactions to acrylic adhesives (On-Body Injector only) -an unusual or allergic reaction to pegfilgrastim, filgrastim, other medicines, foods, dyes, or preservatives -pregnant or trying to get pregnant -breast-feeding How should I use this medicine? This medicine is for injection under the skin. If you get this medicine at home, you will be taught how to prepare and give the pre-filled syringe or how to use the On-body Injector. Refer to the patient Instructions for Use for detailed instructions. Use exactly as directed. Take your medicine at regular intervals. Do not take your medicine more often than directed. It is important that you put your used needles and syringes in a special sharps container. Do not put them in a trash can. If you do not have a sharps container, call your pharmacist or healthcare provider to get one. Talk to your pediatrician regarding the use of this medicine in children. Special care may be needed. Overdosage: If you think you have taken too much of this medicine contact a poison control center or emergency room at once. NOTE: This medicine is only for you. Do not share this medicine with others. What if I miss a dose? It is important not to miss your dose. Call your  doctor or health care professional if you miss your dose. If you miss a dose due to an On-body Injector failure or leakage, a new dose should be administered as soon as possible using a single prefilled syringe for manual use. What may interact with this medicine? Interactions have not been studied. Give your health care provider a list of all the medicines, herbs, non-prescription drugs, or dietary supplements you use. Also tell them if you smoke, drink alcohol,  or use illegal drugs. Some items may interact with your medicine. This list may not describe all possible interactions. Give your health care provider a list of all the medicines, herbs, non-prescription drugs, or dietary supplements you use. Also tell them if you smoke, drink alcohol, or use illegal drugs. Some items may interact with your medicine. What should I watch for while using this medicine? You may need blood work done while you are taking this medicine. If you are going to need a MRI, CT scan, or other procedure, tell your doctor that you are using this medicine (On-Body Injector only). What side effects may I notice from receiving this medicine? Side effects that you should report to your doctor or health care professional as soon as possible: -allergic reactions like skin rash, itching or hives, swelling of the face, lips, or tongue -dizziness -fever -pain, redness, or irritation at site where injected -pinpoint red spots on the skin -shortness of breath or breathing problems -stomach or side pain, or pain at the shoulder -swelling -tiredness -trouble passing urine Side effects that usually do not require medical attention (report to your doctor or health care professional if they continue or are bothersome): -bone pain -muscle pain This list may not describe all possible side effects. Call your doctor for medical advice about side effects. You may report side effects to FDA at 1-800-FDA-1088. Where should I keep my medicine? Keep out of the reach of children. Store pre-filled syringes in a refrigerator between 2 and 8 degrees C (36 and 46 degrees F). Do not freeze. Keep in carton to protect from light. Throw away this medicine if it is left out of the refrigerator for more than 48 hours. Throw away any unused medicine after the expiration date. NOTE: This sheet is a summary. It may not cover all possible information. If you have questions about this medicine, talk to your  doctor, pharmacist, or health care provider.  2015, [employer]. (2013-12-26 16:14:05)

## 2014-12-03 ENCOUNTER — Ambulatory Visit (HOSPITAL_BASED_OUTPATIENT_CLINIC_OR_DEPARTMENT_OTHER): Payer: BLUE CROSS/BLUE SHIELD

## 2014-12-03 ENCOUNTER — Telehealth: Payer: Self-pay | Admitting: *Deleted

## 2014-12-03 DIAGNOSIS — Z5189 Encounter for other specified aftercare: Secondary | ICD-10-CM

## 2014-12-03 DIAGNOSIS — C50411 Malignant neoplasm of upper-outer quadrant of right female breast: Secondary | ICD-10-CM

## 2014-12-03 MED ORDER — PEGFILGRASTIM INJECTION 6 MG/0.6ML ~~LOC~~
6.0000 mg | PREFILLED_SYRINGE | Freq: Once | SUBCUTANEOUS | Status: AC
  Administered 2014-12-03: 6 mg via SUBCUTANEOUS
  Filled 2014-12-03: qty 0.6

## 2014-12-03 NOTE — Patient Instructions (Signed)
Pegfilgrastim injection What is this medicine? PEGFILGRASTIM (peg fil GRA stim) is a long-acting granulocyte colony-stimulating factor that stimulates the growth of neutrophils, a type of white blood cell important in the body's fight against infection. It is used to reduce the incidence of fever and infection in patients with certain types of cancer who are receiving chemotherapy that affects the bone marrow. This medicine may be used for other purposes; ask your health care provider or pharmacist if you have questions. COMMON BRAND NAME(S): Neulasta What should I tell my health care provider before I take this medicine? They need to know if you have any of these conditions: -latex allergy -ongoing radiation therapy -sickle cell disease -skin reactions to acrylic adhesives (On-Body Injector only) -an unusual or allergic reaction to pegfilgrastim, filgrastim, other medicines, foods, dyes, or preservatives -pregnant or trying to get pregnant -breast-feeding How should I use this medicine? This medicine is for injection under the skin. If you get this medicine at home, you will be taught how to prepare and give the pre-filled syringe or how to use the On-body Injector. Refer to the patient Instructions for Use for detailed instructions. Use exactly as directed. Take your medicine at regular intervals. Do not take your medicine more often than directed. It is important that you put your used needles and syringes in a special sharps container. Do not put them in a trash can. If you do not have a sharps container, call your pharmacist or healthcare provider to get one. Talk to your pediatrician regarding the use of this medicine in children. Special care may be needed. Overdosage: If you think you have taken too much of this medicine contact a poison control center or emergency room at once. NOTE: This medicine is only for you. Do not share this medicine with others. What if I miss a dose? It is  important not to miss your dose. Call your doctor or health care professional if you miss your dose. If you miss a dose due to an On-body Injector failure or leakage, a new dose should be administered as soon as possible using a single prefilled syringe for manual use. What may interact with this medicine? Interactions have not been studied. Give your health care provider a list of all the medicines, herbs, non-prescription drugs, or dietary supplements you use. Also tell them if you smoke, drink alcohol, or use illegal drugs. Some items may interact with your medicine. This list may not describe all possible interactions. Give your health care provider a list of all the medicines, herbs, non-prescription drugs, or dietary supplements you use. Also tell them if you smoke, drink alcohol, or use illegal drugs. Some items may interact with your medicine. What should I watch for while using this medicine? You may need blood work done while you are taking this medicine. If you are going to need a MRI, CT scan, or other procedure, tell your doctor that you are using this medicine (On-Body Injector only). What side effects may I notice from receiving this medicine? Side effects that you should report to your doctor or health care professional as soon as possible: -allergic reactions like skin rash, itching or hives, swelling of the face, lips, or tongue -dizziness -fever -pain, redness, or irritation at site where injected -pinpoint red spots on the skin -shortness of breath or breathing problems -stomach or side pain, or pain at the shoulder -swelling -tiredness -trouble passing urine Side effects that usually do not require medical attention (report to your doctor   or health care professional if they continue or are bothersome): -bone pain -muscle pain This list may not describe all possible side effects. Call your doctor for medical advice about side effects. You may report side effects to FDA at  1-800-FDA-1088. Where should I keep my medicine? Keep out of the reach of children. Store pre-filled syringes in a refrigerator between 2 and 8 degrees C (36 and 46 degrees F). Do not freeze. Keep in carton to protect from light. Throw away this medicine if it is left out of the refrigerator for more than 48 hours. Throw away any unused medicine after the expiration date. NOTE: This sheet is a summary. It may not cover all possible information. If you have questions about this medicine, talk to your doctor, pharmacist, or health care provider.  2015, [employer]. (2013-12-26 16:14:05)  

## 2014-12-03 NOTE — Telephone Encounter (Signed)
Katie Jacobson here for Neulasta injection following 1st taxotere/cytoxan chemotherapy.  States that she is doing well.  No nausea or vomiting.  Did have some diarrhea last night which she attributes to her diet.  No diarrhea since then.  She is drinking and eating well.  All questions answered  Knows to call if she has any problems or concerns.

## 2014-12-04 ENCOUNTER — Encounter: Payer: Self-pay | Admitting: *Deleted

## 2014-12-09 ENCOUNTER — Telehealth: Payer: Self-pay | Admitting: *Deleted

## 2014-12-09 ENCOUNTER — Telehealth: Payer: Self-pay | Admitting: Nurse Practitioner

## 2014-12-09 ENCOUNTER — Encounter: Payer: Self-pay | Admitting: Nurse Practitioner

## 2014-12-09 ENCOUNTER — Ambulatory Visit (HOSPITAL_BASED_OUTPATIENT_CLINIC_OR_DEPARTMENT_OTHER): Payer: BLUE CROSS/BLUE SHIELD | Admitting: Nurse Practitioner

## 2014-12-09 ENCOUNTER — Other Ambulatory Visit: Payer: Self-pay | Admitting: *Deleted

## 2014-12-09 ENCOUNTER — Other Ambulatory Visit (HOSPITAL_BASED_OUTPATIENT_CLINIC_OR_DEPARTMENT_OTHER): Payer: BLUE CROSS/BLUE SHIELD

## 2014-12-09 VITALS — BP 97/68 | HR 101 | Temp 98.5°F | Resp 20 | Wt 210.8 lb

## 2014-12-09 DIAGNOSIS — C50411 Malignant neoplasm of upper-outer quadrant of right female breast: Secondary | ICD-10-CM

## 2014-12-09 DIAGNOSIS — Z171 Estrogen receptor negative status [ER-]: Secondary | ICD-10-CM

## 2014-12-09 DIAGNOSIS — M899 Disorder of bone, unspecified: Secondary | ICD-10-CM

## 2014-12-09 DIAGNOSIS — N898 Other specified noninflammatory disorders of vagina: Secondary | ICD-10-CM

## 2014-12-09 DIAGNOSIS — Z720 Tobacco use: Secondary | ICD-10-CM

## 2014-12-09 LAB — CBC WITH DIFFERENTIAL/PLATELET
BASO%: 0.3 % (ref 0.0–2.0)
BASOS ABS: 0 10*3/uL (ref 0.0–0.1)
EOS ABS: 0 10*3/uL (ref 0.0–0.5)
EOS%: 0.1 % (ref 0.0–7.0)
HCT: 43.6 % (ref 34.8–46.6)
HEMOGLOBIN: 14.3 g/dL (ref 11.6–15.9)
LYMPH%: 31.4 % (ref 14.0–49.7)
MCH: 30 pg (ref 25.1–34.0)
MCHC: 32.8 g/dL (ref 31.5–36.0)
MCV: 91.6 fL (ref 79.5–101.0)
MONO#: 1.1 10*3/uL — AB (ref 0.1–0.9)
MONO%: 16.8 % — ABNORMAL HIGH (ref 0.0–14.0)
NEUT%: 51.4 % (ref 38.4–76.8)
NEUTROS ABS: 3.3 10*3/uL (ref 1.5–6.5)
Platelets: 172 10*3/uL (ref 145–400)
RBC: 4.76 10*6/uL (ref 3.70–5.45)
RDW: 14.8 % — ABNORMAL HIGH (ref 11.2–14.5)
WBC: 6.5 10*3/uL (ref 3.9–10.3)
lymph#: 2 10*3/uL (ref 0.9–3.3)

## 2014-12-09 LAB — COMPREHENSIVE METABOLIC PANEL (CC13)
ALT: 16 U/L (ref 0–55)
AST: 13 U/L (ref 5–34)
Albumin: 3.2 g/dL — ABNORMAL LOW (ref 3.5–5.0)
Alkaline Phosphatase: 68 U/L (ref 40–150)
Anion Gap: 12 mEq/L — ABNORMAL HIGH (ref 3–11)
BUN: 11.6 mg/dL (ref 7.0–26.0)
CO2: 24 mEq/L (ref 22–29)
CREATININE: 1.1 mg/dL (ref 0.6–1.1)
Calcium: 9 mg/dL (ref 8.4–10.4)
Chloride: 103 mEq/L (ref 98–109)
EGFR: 65 mL/min/{1.73_m2} — ABNORMAL LOW (ref >90)
Glucose: 114 mg/dl (ref 70–140)
Potassium: 3.9 mEq/L (ref 3.5–5.1)
Sodium: 139 mEq/L (ref 136–145)
Total Bilirubin: 0.33 mg/dL (ref 0.20–1.20)
Total Protein: 6.4 g/dL (ref 6.4–8.3)

## 2014-12-09 NOTE — Progress Notes (Signed)
[place]  Telephone:(336) [phone] Fax:([phone]) [phone]     ID: Katie Jacobson DOB: 11-Dec-1955  MR#: [id number]  PHX#:[id number]  Patient Care Team: Debbrah Alar, NP as PCP - General (Internal Medicine) Gus Height, MD (Obstetrics and Gynecology) Rolm Bookbinder, MD as Consulting Physician (General Surgery) Chauncey Cruel, MD as Consulting Physician (Oncology) Eppie Gibson, MD as Attending Physician (Radiation Oncology) Roselee Culver, RN as Registered Nurse [place], RN as Registered Nurse Trinda Pascal, NP as Nurse Practitioner (Nurse Practitioner) OTHER MD:  CHIEF COMPLAINT: Triple negative breast cancer  CURRENT TREATMENT: Neoadjuvant chemotherapy   BREAST CANCER HISTORY: From the original intake note:  The patient herself palpated a mass in her right breast there is she brought it to her gynecologist, Dr. Alan Ripper attention and he obtained screening mammography which suggested an abnormality in the right breast. On 10/24/2014 the patient underwent right diagnostic mammography and ultrasonography at the breast Center. The breast density was category B. There was an ill-defined mass in the upper outer quadrant of the right breast which was palpable on exam. By ultrasound this was irregular and hypoechoic measuring 1.2 cm. Evaluation of the right axilla was negative.  Biopsy of the mass in question showed an invasive ductal carcinoma, grade 2 or 3, triple negative, with an MIB-1 of 19%.  Katie Jacobson case was presented at the multidisciplinary breast cancer conference 11/19/2014, where was suggested she would benefit from genetics testing. Since this would mandate a significant delay in her final surgery, neoadjuvant chemotherapy was indicated.  The patient's subsequent history is as detailed below  INTERVAL HISTORY: Katie Jacobson returns today for follow-up of her breast cancer, accompanied by her daughter. Today is day 8, cycle 1  of cyclophosphamide and docetaxel, with neulasta given on day 2 for granulocyte support. Her main complaint this past week was debilitating bone pain. She only took claratin for 2 days. The pain started on Sunday and was so severe she was unable to move much on Monday. Eventually she took one of her hydrocodone tablets from her previous surgery, and this helped.  REVIEW OF SYSTEMS: Katie Jacobson denies fevers or chills. She had no nausea, after taking her antiemetics as prescribed. She continues to move her bowels well. Her appetite is "ravenous." She is experiencing some old dried bloody drainage from her left nostril and she has been told to try saline nasal spray. She slept well with lorazepam initially, but now it does not work as well. She is having immense vaginal dryness and has been using some water based lubricant suppositories, which have helped some. A detailed review of systems is otherwise stable.  PAST MEDICAL HISTORY: Past Medical History  Diagnosis Date  . Heart palpitations   . Osteoporosis   . Essential hypertension 04/01/2011  . History of partial seizures 1970    never knew why  . Mitral valve prolapse 1990  . Personal history of kidney stones 05/2012  . Obesity (BMI 35.0-39.9 without comorbidity)   . Breast cancer of upper-outer quadrant of right female breast 11/14/2014  . Family history of breast cancer     PAST SURGICAL HISTORY: Past Surgical History  Procedure Laterality Date  . Appendectomy  1976  . Abdominal hysterectomy  1998    ovaries spared  . Tonsillectomy  1970s  . Knee arthroscopy  2006    left  knee  . Cystectomy      Pt reports removal of a "stone" from the right side of her throat as a  child  . Tubal ligation  1996  . Foot sx      X2  . Doppler echocardiography  03/27/1989    suggestive eidence of mitral valve proplapse miminal ; trace mitral regurgitation  . Nm myoview ltd  07/01/1996    EF 62%  . Salivary stone removal  1972    right neck  .  Colonoscopy    . Portacath placement N/A 11/27/2014    Procedure: INSERTION PORT-A-CATH;  Surgeon: Rolm Bookbinder, MD;  Location: [facility];  Service: General;  Laterality: N/A;    FAMILY HISTORY Family History  Problem Relation Age of Onset  . Heart disease Mother   . Stroke Mother   . Hypertension Mother   . Diabetes Mother   . Diabetes Sister   . Kidney disease Brother   . Mental retardation Brother   . Breast cancer Cousin     deceased 57  . Diabetes Sister   . Colon cancer Neg Hx   . Esophageal cancer Neg Hx   . Stomach cancer Neg Hx   . Heart attack Father    the patient knows little about her father. Her mother died at at the age of 53 following a stroke. The patient had 2 brothers, 2 sisters. The only history of breast or ovarian cancer in the family is a first cousin on the mother's side diagnosed with breast cancer at age 50  GYNECOLOGIC HISTORY:  Patient's last menstrual period was 10/10/1996. Menarche age 31, first live birth age 53. The patient is GX P2. She status post simple hysterectomy without salpingo-oophorectomy. She did not take hormone replacement.  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 The patient's husband, Jori Moll "Ron" Sherpa works for [employer] in the pattern shop. He also works as a Dispensing optician. Daughter Tonda Wiederhold lives in [place] and works in Programmer, applications. Daughter [name] Schlack lives in [place] and works at a [employer]. 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    ADVANCED DIRECTIVES: Not in place   HEALTH MAINTENANCE: History  Substance Use Topics  . Smoking status: Current Every Day Smoker -- 0.50 packs/day for 39 years    Types: Cigarettes  . Smokeless tobacco: Never Used  . Alcohol Use: 4.2 oz/week    7 Cans of beer per week     Colonoscopy:  PAP:  Bone density:  Lipid panel:  Allergies  Allergen Reactions  . Sulfa Antibiotics  Other (See Comments)    Unknown reaction    Current Outpatient Prescriptions  Medication Sig Dispense Refill  . chlorthalidone (HYGROTON) 25 MG tablet TAKE 1 TABLET BY MOUTH EVERY DAY (Patient taking differently: TAKE 1 TABLET BY MOUTH EVERY OTHER DAY) 30 tablet 0  . dexamethasone (DECADRON) 4 MG tablet Take 2 tablets (8 mg total) by mouth 2 (two) times daily. Start the day before Taxotere. Then again the day after chemo for 3 days. 30 tablet 1  . enalapril (VASOTEC) 20 MG tablet Take 1 tablet (20 mg total) by mouth at bedtime. 30 tablet 11  . lidocaine-prilocaine (EMLA) cream Apply to affected area once 30 g 3  . LORazepam (ATIVAN) 0.5 MG tablet Take 1 tablet (0.5 mg total) by mouth at bedtime. 30 tablet 0  . metoprolol succinate (TOPROL-XL) 50 MG 24 hr tablet Take 1 tablet (50 mg total) by mouth at bedtime. 30 tablet 11  . ondansetron (ZOFRAN) 8 MG tablet Take 1  tablet (8 mg total) by mouth 2 (two) times daily. Start the day after chemo for 3 days. Then take as needed for nausea or vomiting. 30 tablet 1  . prochlorperazine (COMPAZINE) 10 MG tablet Take 1 tablet (10 mg total) by mouth every 6 (six) hours as needed (Nausea or vomiting). 30 tablet 1  . tobramycin-dexamethasone (TOBRADEX) ophthalmic solution Place 1 drop into both eyes every 6 (six) hours. 5 mL 1  . albuterol (PROVENTIL HFA;VENTOLIN HFA) 108 (90 BASE) MCG/ACT inhaler Inhale into the lungs every 6 (six) hours as needed for wheezing or shortness of breath.    . fluticasone (FLONASE) 50 MCG/ACT nasal spray Place 1 spray into both nostrils as needed.    Marland Kitchen oxyCODONE-acetaminophen (PERCOCET) 10-325 MG per tablet Take 1 tablet by mouth every 6 (six) hours as needed for pain. (Patient not taking: Reported on 12/09/2014) 10 tablet 0   No current facility-administered medications for this visit.    OBJECTIVE: Middle-aged African-American woman in no acute distress Filed Vitals:   12/09/14 1513  BP: 97/68  Pulse: 101  Temp: 98.5 F (36.9  C)  Resp: 20     Body mass index is 39.84 kg/(m^2).    ECOG FS:0 - Asymptomatic  Skin: warm, dry  HEENT: sclerae anicteric, conjunctivae pink, oropharynx clear. No thrush or mucositis.  Lymph Nodes: No cervical or supraclavicular lymphadenopathy  Lungs: clear to auscultation bilaterally, no rales, wheezes, or rhonci  Heart: regular rate and rhythm  Abdomen: round, soft, non tender, positive bowel sounds  Musculoskeletal: No focal spinal tenderness, no peripheral edema  Neuro: non focal, well oriented, positive affect  Breasts: deferred   LAB RESULTS:  CMP     Component Value Date/Time   NA 142 12/02/2014 0814   NA 137 09/08/2013 1120   K 4.3 12/02/2014 0814   K 3.6 09/08/2013 1120   CL 103 09/08/2013 1120   CO2 25 12/02/2014 0814   CO2 21 09/08/2013 1120   GLUCOSE 123 12/02/2014 0814   GLUCOSE 89 09/08/2013 1120   BUN 14.6 12/02/2014 0814   BUN 9 09/08/2013 1120   CREATININE 0.9 12/02/2014 0814   CREATININE 0.85 09/08/2013 1120   CREATININE 0.93 09/02/2013 1128   CALCIUM 9.8 12/02/2014 0814   CALCIUM 9.4 09/08/2013 1120   PROT 6.9 12/02/2014 0814   PROT 7.0 09/08/2013 1120   ALBUMIN 3.3* 12/02/2014 0814   ALBUMIN 3.4* 09/08/2013 1120   AST 10 12/02/2014 0814   AST 15 09/08/2013 1120   ALT 13 12/02/2014 0814   ALT 12 09/08/2013 1120   ALKPHOS 67 12/02/2014 0814   ALKPHOS 80 09/08/2013 1120   BILITOT 0.28 12/02/2014 0814   BILITOT 0.5 09/08/2013 1120   GFRNONAA 76* 09/08/2013 1120   GFRNONAA 70 09/02/2013 1128   GFRAA 88* 09/08/2013 1120   GFRAA 81 09/02/2013 1128    INo results found for: SPEP, UPEP  Lab Results  Component Value Date   WBC 6.5 12/09/2014   NEUTROABS 3.3 12/09/2014   HGB 14.3 12/09/2014   HCT 43.6 12/09/2014   MCV 91.6 12/09/2014   PLT 172 12/09/2014      Chemistry      Component Value Date/Time   NA 142 12/02/2014 0814   NA 137 09/08/2013 1120   K 4.3 12/02/2014 0814   K 3.6 09/08/2013 1120   CL 103 09/08/2013 1120   CO2 25  12/02/2014 0814   CO2 21 09/08/2013 1120   BUN 14.6 12/02/2014 0814   BUN 9 09/08/2013  1120   CREATININE 0.9 12/02/2014 0814   CREATININE 0.85 09/08/2013 1120   CREATININE 0.93 09/02/2013 1128      Component Value Date/Time   CALCIUM 9.8 12/02/2014 0814   CALCIUM 9.4 09/08/2013 1120   ALKPHOS 67 12/02/2014 0814   ALKPHOS 80 09/08/2013 1120   AST 10 12/02/2014 0814   AST 15 09/08/2013 1120   ALT 13 12/02/2014 0814   ALT 12 09/08/2013 1120   BILITOT 0.28 12/02/2014 0814   BILITOT 0.5 09/08/2013 1120       No results found for: LABCA2  No components found for: LABCA125  No results for input(s): INR in the last 168 hours.  Urinalysis    Component Value Date/Time   COLORURINE YELLOW 09/08/2013 [place] 09/08/2013 1258   LABSPEC 1.019 09/08/2013 1258   PHURINE 8.0 09/08/2013 1258   GLUCOSEU NEGATIVE 09/08/2013 1258   HGBUR TRACE* 09/08/2013 1258   BILIRUBINUR NEGATIVE 09/08/2013 1258   KETONESUR 40* 09/08/2013 1258   PROTEINUR NEGATIVE 09/08/2013 1258   UROBILINOGEN 0.2 09/08/2013 1258   NITRITE NEGATIVE 09/08/2013 1258   LEUKOCYTESUR NEGATIVE 09/08/2013 1258    STUDIES: Mr Breast Bilateral W Wo Contrast  11/21/2014   CLINICAL DATA:  58 year old female with recently diagnosed invasive ductal carcinoma post ultrasound-guided biopsy of a mass in the upper right breast at 11 o'clock.  LABS:  Not applicable.  EXAM: BILATERAL BREAST MRI WITH AND WITHOUT CONTRAST  TECHNIQUE: Multiplanar, multisequence MR images of both breasts were obtained prior to and following the intravenous administration of 20 ml of Multihance.  THREE-DIMENSIONAL MR IMAGE RENDERING ON INDEPENDENT WORKSTATION:  Three-dimensional MR images were rendered by post-processing of the original MR data on an independent workstation. The three-dimensional MR images were interpreted, and findings are reported in the following complete MRI report for this study. Three dimensional images were evaluated  at the independent DynaCad workstation  COMPARISON:  Previous exam(s).  FINDINGS: Breast composition: b.  Scattered fibroglandular tissue.  Background parenchymal enhancement: Mild.  Right breast: Irregular enhancing mass in the upper right breast at 11-12 o'clock containing biopsy clip artifact compatible with biopsy proven malignancy measures 1.3 x 1.2 x 0.8 cm. A 1 cm oval well-circumscribed high T2 signal enhancing mass is present 2.5 cm posterior medial to the dominant corresponding to a mass which has been mammographically stable since at least 09/12/2013, likely a fibroadenoma. No additional suspicious rapidly enhancing masses or abnormal areas of enhancement seen in the right breast to suggest additional sites of disease.  Left breast: No suspicious rapidly enhancing masses or abnormal areas of enhancement seen in the left breast to suggest malignancy.  Lymph nodes: No morphologically abnormal axillary lymph nodes. No internal mammary lymphadenopathy.  Ancillary findings:  None.  IMPRESSION: 1. Biopsy proven malignancy in the superior right breast measures up to 1.3 cm. No suspicious lymphadenopathy or findings to suggest multifocal or multicentric disease.  2. Benign circumscribed oval enhancing mass located posterior medial to the biopsy proven malignancy corresponds with a mammographically stable mass, with imaging features most suggestive of a fibroadenoma.  RECOMMENDATION: Treatment plan for known right breast malignancy.  BI-RADS CATEGORY  6: Known biopsy-proven malignancy.   Electronically Signed   By: Everlean Alstrom M.D.   On: 11/21/2014 13:49   Dg Chest Port 1 View  11/27/2014   CLINICAL DATA:  Postop.  EXAM: PORTABLE CHEST - 1 VIEW  COMPARISON:  03/25/2008  FINDINGS: Study dictated in exception status due to stat indication and clinician request.  Technologist confirms correct patient and image.  Left subclavian porta catheter, tip at the SVC level.  There is mild pulmonary venous congestion  which is likely from supine positioning. No pneumothorax or mediastinal widening. No evidence of collapse or aspiration.  Normal heart size and mediastinal contours for technique.  IMPRESSION: A left subclavian central line is in good position. No pneumothorax.   Electronically Signed   By: Monte Fantasia M.D.   On: 11/27/2014 12:17   Mm Digital Diagnostic Unilat R  11/13/2014   ADDENDUM REPORT: 11/13/2014 14:08  ADDENDUM: PATHOLOGY ADDENDUM:  Pathology right breast mass: Invasive ductal carcinoma  Pathology concordance with imaging findings: Yes  Recommendation: Consultation regarding treatment plan. Multidisciplinary clinic appointment is scheduled for the patient on 11/19/2014. MRI appointment can be scheduled if needed.  At the request of the patient, I spoke with her by telephone on 11/13/2014 at 14:08. She reports doing well after the biopsy .   Electronically Signed   By: Shon Hale M.D.   On: 11/13/2014 14:08   11/13/2014   CLINICAL DATA:  Status post ultrasound-guided core biopsy of mass in the 11 o'clock location of right breast.  EXAM: DIAGNOSTIC RIGHT MAMMOGRAM POST ULTRASOUND BIOPSY  COMPARISON:  Previous exam(s).  FINDINGS: Mammographic images were obtained following ultrasound guided biopsy of mass in the 11 o'clock location of the right breast. A coil shaped clip is identified in the upper-outer quadrant of the right breast as expected.  IMPRESSION: Tissue marker clip is in expected location following biopsy.  Final Assessment: Post Procedure Mammograms for Marker Placement  Electronically Signed: By: Shon Hale M.D. On: 11/12/2014 15:19   Dg C-arm 1-60 Min-no Report  11/29/2014   : Fluoroscopy was utilized by the requesting physician. No radiographic interpretation.   Electronically Signed   By: Porfirio Mylar   On: 11/29/2014 14:59   [place] Rt Breast Bx W Loc Dev 1st Lesion Img Bx Spec US Guide  11/12/2014   CLINICAL DATA:  Patient presents for ultrasound-guided core biopsy of mass in the 11  o'clock location of the right breast.  EXAM: ULTRASOUND GUIDED RIGHT BREAST CORE NEEDLE BIOPSY  COMPARISON:  Previous exam(s).  PROCEDURE: I met with the patient and we discussed the procedure of ultrasound-guided biopsy, including benefits and alternatives. We discussed the high likelihood of a successful procedure. We discussed the risks of the procedure including infection, bleeding, tissue injury, clip migration, and inadequate sampling. Informed written consent was given. The usual time-out protocol was performed immediately prior to the procedure.  Using sterile technique and 2% Lidocaine as local anesthetic, under direct ultrasound visualization, a 12 gauge vacuum-assisted device was used to perform biopsy of mass in the 11 o'clock location of the right breastusing a caudal approach. At the conclusion of the procedure, a coil shaped tissue marker clip was deployed into the biopsy cavity. Follow-up 2-view mammogram was performed and dictated separately.  IMPRESSION: Ultrasound-guided biopsy of right breast mass. No apparent complications.   Electronically Signed   By: Shon Hale M.D.   On: 11/12/2014 15:02    ASSESSMENT: 59 y.o. [place], [place] woman status post right breast upper outer quadrant biopsy 11/12/2014 for a clinical T1c N0, stage IA invasive ductal carcinoma, triple negative, with an MIB-1 of 19%  (1) neoadjuvant chemotherapy will consist of cyclophosphamide and docetaxel 4 given every 21 days with Neulasta support, first dose 12/02/2014  (2) genetics testing pending  (3) surgery to be guided by genetics results, and to take  place at the conclusion of chemotherapy  (4) radiation to follow surgery  (5) continuing tobacco abuse. The patient has been strongly advised to quit smoking  PLAN: Overall Katie Jacobson is tolerated her first treatment well. The labs were reviewed in detail and were entirely stable.   With her next treatment, she will take her claratin for longer to  see if this helps reduce her bone pain. I suggested she try melatonin at night to sleep, and coconut oil for her vaginal dryness.  Katie Jacobson will return in 2 weeks for the start of cycle 2 of cyclophosphamide and docetaxel. She understands and agrees with this plan. She knows the goal of treatment in her case is cure. She has been encouraged to call with any issues that might arise before her next visit here.   Marcelino Duster, NP   12/09/2014 3:40 PM

## 2014-12-09 NOTE — Telephone Encounter (Signed)
Pt was r/s from NP/LT to NP/HF due to needed space for chemo pt. Edit note states pt needs to p/u schedule before she leaves visit today... KJ

## 2014-12-09 NOTE — Telephone Encounter (Signed)
I have adjusted 3/15 appt

## 2014-12-15 ENCOUNTER — Other Ambulatory Visit: Payer: Self-pay | Admitting: Oncology

## 2014-12-15 ENCOUNTER — Encounter: Payer: Self-pay | Admitting: Genetic Counselor

## 2014-12-15 DIAGNOSIS — Z1379 Encounter for other screening for genetic and chromosomal anomalies: Secondary | ICD-10-CM

## 2014-12-15 NOTE — Progress Notes (Unsigned)
At the time of Katie Jacobson's visit, we recommended she pursue genetic testing of multiple genes on the BreastNext gene panel. This test, which included sequencing and deletion/duplication analysis of 17 genes, was performed at [facility]. Testing was normal and did not reveal a mutation in these genes. The genes tested were ATM, BARD1, BRCA1, BRCA2, BRIP1, CDH1, CHEK2, MRE11A, MUTYH, NBN, NF1, PALB2, PTEN, RAD50, RAD51C, RAD51D, and TP53.

## 2014-12-15 NOTE — Progress Notes (Signed)
GENETIC TEST RESULTS  Patient Name: Katie Jacobson Patient Age: 59 y.o. Encounter Date: 12/15/2014  Referring Physician: Lurline Del, MD   [name] was called today to discuss genetic test results. Please see the Genetics note from her visit on 11/20/14 for a detailed discussion of her personal and family history.  GENETIC TESTING: At the time of [name]'s visit, we recommended she pursue genetic testing of multiple genes on the BreastNext gene panel. This test, which included sequencing and deletion/duplication analysis of 17 genes, was performed at [facility]. Testing was normal and did not reveal a mutation in these genes. The genes tested were ATM, BARD1, BRCA1, BRCA2, BRIP1, CDH1, CHEK2, MRE11A, MUTYH, NBN, NF1, PALB2, PTEN, RAD50, RAD51C, RAD51D, and TP53.  We discussed with [name] that since the current test is not perfect, it is possible there may be a gene mutation that current testing cannot detect, but that chance is small. We also discussed that it is possible that a different genetic factor, which was not part of this testing or has not yet been discovered, is responsible for the cancer diagnoses in the family. Should Ms. Flight wish to discuss or pursue this additional testing, we are happy to coordinate this at any time, but do not feel that she is at significant risk of harboring a mutation in a different gene.     CANCER SCREENING: This result suggests that [name]'s cancer was most likely not due to an inherited predisposition. Most cancers happen by chance and this negative test, along with details of her family history, suggests that her cancer most likely falls into this category. We, therefore, recommended she continue to follow the cancer screening guidelines provided by her physician.   FAMILY MEMBERS: Women in the family are at some increased risk of developing breast cancer, over the general population risk, simply due to the family history. We  recommended they have a yearly mammogram beginning at age 33, a yearly clinical breast exam, and perform monthly breast self-exams. A gynecologic exam is recommended yearly. Colon cancer screening is recommended to begin by age 49.  Lastly, we discussed with [name] that cancer genetics is a rapidly advancing field and it is possible that new genetic tests will be appropriate for her in the future. We encouraged her to remain in contact with [place] on an annual basis so we can update her personal and family histories, and let her know of advances in cancer genetics that may benefit the family. Our contact number was provided. [name] questions were answered to her satisfaction today, and she knows she is welcome to call anytime with additional questions.    Steele Berg, MS, [place] Certified Genetic Counselor phone: [phone] [email or web address]

## 2014-12-22 ENCOUNTER — Encounter: Payer: Self-pay | Admitting: Oncology

## 2014-12-22 ENCOUNTER — Telehealth: Payer: Self-pay | Admitting: Nurse Practitioner

## 2014-12-22 NOTE — Progress Notes (Signed)
I faxed disability forms to [facility] [phone] for Katie Jacobson.

## 2014-12-22 NOTE — Telephone Encounter (Signed)
pt cld to verify appt-left vm-cld pt back and left message to adv time & date for appt 3/15

## 2014-12-23 ENCOUNTER — Ambulatory Visit: Payer: BLUE CROSS/BLUE SHIELD

## 2014-12-23 ENCOUNTER — Other Ambulatory Visit: Payer: BLUE CROSS/BLUE SHIELD

## 2014-12-23 ENCOUNTER — Other Ambulatory Visit (HOSPITAL_BASED_OUTPATIENT_CLINIC_OR_DEPARTMENT_OTHER): Payer: BLUE CROSS/BLUE SHIELD

## 2014-12-23 ENCOUNTER — Ambulatory Visit: Payer: BLUE CROSS/BLUE SHIELD | Admitting: Nurse Practitioner

## 2014-12-23 ENCOUNTER — Ambulatory Visit (HOSPITAL_BASED_OUTPATIENT_CLINIC_OR_DEPARTMENT_OTHER): Payer: BLUE CROSS/BLUE SHIELD | Admitting: Nurse Practitioner

## 2014-12-23 ENCOUNTER — Encounter: Payer: Self-pay | Admitting: Nurse Practitioner

## 2014-12-23 ENCOUNTER — Ambulatory Visit (HOSPITAL_BASED_OUTPATIENT_CLINIC_OR_DEPARTMENT_OTHER): Payer: BLUE CROSS/BLUE SHIELD

## 2014-12-23 VITALS — BP 133/67 | HR 83 | Temp 98.2°F | Resp 20 | Wt 216.0 lb

## 2014-12-23 DIAGNOSIS — C50411 Malignant neoplasm of upper-outer quadrant of right female breast: Secondary | ICD-10-CM

## 2014-12-23 DIAGNOSIS — Z171 Estrogen receptor negative status [ER-]: Secondary | ICD-10-CM

## 2014-12-23 DIAGNOSIS — Z5189 Encounter for other specified aftercare: Secondary | ICD-10-CM

## 2014-12-23 DIAGNOSIS — Z5111 Encounter for antineoplastic chemotherapy: Secondary | ICD-10-CM

## 2014-12-23 LAB — CBC WITH DIFFERENTIAL/PLATELET
BASO%: 0 % (ref 0.0–2.0)
Basophils Absolute: 0 10*3/uL (ref 0.0–0.1)
EOS%: 0 % (ref 0.0–7.0)
Eosinophils Absolute: 0 10*3/uL (ref 0.0–0.5)
HEMATOCRIT: 36.5 % (ref 34.8–46.6)
HGB: 12.3 g/dL (ref 11.6–15.9)
LYMPH#: 0.6 10*3/uL — AB (ref 0.9–3.3)
LYMPH%: 7.3 % — ABNORMAL LOW (ref 14.0–49.7)
MCH: 30.8 pg (ref 25.1–34.0)
MCHC: 33.7 g/dL (ref 31.5–36.0)
MCV: 91.3 fL (ref 79.5–101.0)
MONO#: 0 10*3/uL — AB (ref 0.1–0.9)
MONO%: 0.5 % (ref 0.0–14.0)
NEUT#: 7.2 10*3/uL — ABNORMAL HIGH (ref 1.5–6.5)
NEUT%: 92.2 % — AB (ref 38.4–76.8)
Platelets: 285 10*3/uL (ref 145–400)
RBC: 4 10*6/uL (ref 3.70–5.45)
RDW: 15.6 % — ABNORMAL HIGH (ref 11.2–14.5)
WBC: 7.9 10*3/uL (ref 3.9–10.3)

## 2014-12-23 LAB — COMPREHENSIVE METABOLIC PANEL (CC13)
ALK PHOS: 65 U/L (ref 40–150)
ALT: 14 U/L (ref 0–55)
AST: 12 U/L (ref 5–34)
Albumin: 3.3 g/dL — ABNORMAL LOW (ref 3.5–5.0)
Anion Gap: 12 mEq/L — ABNORMAL HIGH (ref 3–11)
BILIRUBIN TOTAL: 0.36 mg/dL (ref 0.20–1.20)
BUN: 14.8 mg/dL (ref 7.0–26.0)
CO2: 24 mEq/L (ref 22–29)
Calcium: 9.3 mg/dL (ref 8.4–10.4)
Chloride: 107 mEq/L (ref 98–109)
Creatinine: 0.8 mg/dL (ref 0.6–1.1)
EGFR: >90 mL/min/{1.73_m2} (ref >90)
GLUCOSE: 112 mg/dL (ref 70–140)
Potassium: 4 mEq/L (ref 3.5–5.1)
SODIUM: 142 meq/L (ref 136–145)
TOTAL PROTEIN: 6.9 g/dL (ref 6.4–8.3)

## 2014-12-23 MED ORDER — SODIUM CHLORIDE 0.9 % IV SOLN
Freq: Once | INTRAVENOUS | Status: AC
  Administered 2014-12-23: 16:00:00 via INTRAVENOUS

## 2014-12-23 MED ORDER — CYCLOPHOSPHAMIDE CHEMO INJECTION 1 GM
600.0000 mg/m2 | Freq: Once | INTRAMUSCULAR | Status: AC
  Administered 2014-12-23: 1240 mg via INTRAVENOUS
  Filled 2014-12-23: qty 62

## 2014-12-23 MED ORDER — DOCETAXEL CHEMO INJECTION 160 MG/16ML
75.0000 mg/m2 | Freq: Once | INTRAVENOUS | Status: AC
  Administered 2014-12-23: 150 mg via INTRAVENOUS
  Filled 2014-12-23: qty 15

## 2014-12-23 MED ORDER — SODIUM CHLORIDE 0.9 % IV SOLN
Freq: Once | INTRAVENOUS | Status: AC
  Administered 2014-12-23: 16:00:00 via INTRAVENOUS
  Filled 2014-12-23: qty 8

## 2014-12-23 MED ORDER — PEGFILGRASTIM 6 MG/0.6ML ~~LOC~~ PSKT
6.0000 mg | PREFILLED_SYRINGE | Freq: Once | SUBCUTANEOUS | Status: AC
  Administered 2014-12-23: 6 mg via SUBCUTANEOUS
  Filled 2014-12-23: qty 0.6

## 2014-12-23 MED ORDER — HEPARIN SOD (PORK) LOCK FLUSH 100 UNIT/ML IV SOLN
500.0000 [IU] | Freq: Once | INTRAVENOUS | Status: AC | PRN
  Administered 2014-12-23: 500 [IU]
  Filled 2014-12-23: qty 5

## 2014-12-23 MED ORDER — SODIUM CHLORIDE 0.9 % IJ SOLN
10.0000 mL | INTRAMUSCULAR | Status: DC | PRN
  Administered 2014-12-23: 10 mL
  Filled 2014-12-23: qty 10

## 2014-12-23 NOTE — Progress Notes (Signed)
[place]  [phone] [phone]     ID: SEAN MACWILLIAMS DOB: 1956/04/06  MR#: [id number]  SPQ#:[id number]  Patient Care Team: Debbrah Alar, NP as PCP - General (Internal Medicine) Harle Battiest, MD (Obstetrics and Gynecology) Rolm Bookbinder, MD as Consulting Physician (General Surgery) Chauncey Cruel, MD as Consulting Physician (Oncology) Eppie Gibson, MD as Attending Physician (Radiation Oncology) Rockwell Germany, RN as Registered Nurse Mauro Kaufmann, RN as Registered Nurse Holley Bouche, NP as Nurse Practitioner (Nurse Practitioner) OTHER MD:  CHIEF COMPLAINT: Triple negative breast cancer  CURRENT TREATMENT: Neoadjuvant chemotherapy   BREAST CANCER HISTORY: From the original intake note:  The patient herself palpated a mass in her right breast there is she brought it to her gynecologist, Dr. Alan Ripper attention and he obtained screening mammography which suggested an abnormality in the right breast. On 10/24/2014 the patient underwent right diagnostic mammography and ultrasonography at the breast Center. The breast density was category B. There was an ill-defined mass in the upper outer quadrant of the right breast which was palpable on exam. By ultrasound this was irregular and hypoechoic measuring 1.2 cm. Evaluation of the right axilla was negative.  Biopsy of the mass in question showed an invasive ductal carcinoma, grade 2 or 3, triple negative, with an MIB-1 of 19%.  Ms. Klatt case was presented at the multidisciplinary breast cancer conference 11/19/2014, where was suggested she would benefit from genetics testing. Since this would mandate a significant delay in her final surgery, neoadjuvant chemotherapy was indicated.  The patient's subsequent history is as detailed below  INTERVAL HISTORY: Lexie returns today for follow-up of her breast cancer, accompanied by her daughter. Today is day 1, cycle 2 of  cyclophosphamide and docetaxel, with neulasta given on day 2 for granulocyte support. Her hair has started to come out so she shaved it and is now wearing a wonderful scarf. Unfortunately in the past week she has lost both her brother and brother-in-law, so it has been a very trying time for the family. She says she is grieving appropriately, but is relatively upbeat today.  REVIEW OF SYSTEMS: Tavia denies fevers, chills, nausea, vomiting, or changes in bowel or bladder habits. Her appetite continues to be increased and she has gained about 5lb since we saw her 2 weeks ago. She has been sleeping better. Her left great toenail looks like it may be ingrown and light pressure causes pain to the upper outer quadrant. A detailed review of systems is otherwise stable.  PAST MEDICAL HISTORY: Past Medical History  Diagnosis Date  . Heart palpitations   . Osteoporosis   . Essential hypertension 04/01/2011  . History of partial seizures 1970    never knew why  . Mitral valve prolapse 1990  . Personal history of kidney stones 05/2012  . Obesity (BMI 35.0-39.9 without comorbidity)   . Breast cancer of upper-outer quadrant of right female breast 11/14/2014  . Family history of breast cancer     PAST SURGICAL HISTORY: Past Surgical History  Procedure Laterality Date  . Appendectomy  1976  . Abdominal hysterectomy  1998    ovaries spared  . Tonsillectomy  1970s  . Knee arthroscopy  2006    left  knee  . Cystectomy      Pt reports removal of a "stone" from the right side of her throat as a child  . Tubal ligation  1996  . Foot sx      X2  . Doppler  echocardiography  03/27/1989    suggestive eidence of mitral valve proplapse miminal ; trace mitral regurgitation  . Nm myoview ltd  07/01/1996    EF 62%  . Salivary stone removal  1972    right neck  . Colonoscopy    . Portacath placement N/A 11/27/2014    Procedure: INSERTION PORT-A-CATH;  Surgeon: Rolm Bookbinder, MD;  Location: [facility];  Service: General;  Laterality: N/A;    FAMILY HISTORY Family History  Problem Relation Age of Onset  . Heart disease Mother   . Stroke Mother   . Hypertension Mother   . Diabetes Mother   . Diabetes Sister   . Kidney disease Brother   . Mental retardation Brother   . Breast cancer Cousin     deceased 85  . Diabetes Sister   . Colon cancer Neg Hx   . Esophageal cancer Neg Hx   . Stomach cancer Neg Hx   . Heart attack Father    the patient knows little about her father. Her mother died at at the age of 39 following a stroke. The patient had 2 brothers, 2 sisters. The only history of breast or ovarian cancer in the family is a first cousin on the mother's side diagnosed with breast cancer at age 38  GYNECOLOGIC HISTORY:  Patient's last menstrual period was 10/10/1996. Menarche age 22, first live birth age 87. The patient is GX P2. She status post simple hysterectomy without salpingo-oophorectomy. She did not take hormone replacement.  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 The patient's husband, Jori Moll "Ron" Bieda works for [employer] in the pattern shop. He also works as a Dispensing optician. Daughter Ayven Glasco lives in [place] and works in Programmer, applications. Daughter [name] Christian lives in [place] and works at a [employer]. 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    ADVANCED DIRECTIVES: Not in place   HEALTH MAINTENANCE: History  Substance Use Topics  . Smoking status: Current Every Day Smoker -- 0.50 packs/day for 39 years    Types: Cigarettes  . Smokeless tobacco: Never Used  . Alcohol Use: 4.2 oz/week    7 Cans of beer per week     Colonoscopy:  PAP:  Bone density:  Lipid panel:  Allergies  Allergen Reactions  . Sulfa Antibiotics Other (See Comments)    Unknown reaction    Current Outpatient Prescriptions  Medication Sig Dispense Refill  . albuterol (PROVENTIL  HFA;VENTOLIN HFA) 108 (90 BASE) MCG/ACT inhaler Inhale into the lungs every 6 (six) hours as needed for wheezing or shortness of breath.    . chlorthalidone (HYGROTON) 25 MG tablet TAKE 1 TABLET BY MOUTH EVERY DAY (Patient taking differently: TAKE 1 TABLET BY MOUTH EVERY OTHER DAY) 30 tablet 0  . dexamethasone (DECADRON) 4 MG tablet Take 2 tablets (8 mg total) by mouth 2 (two) times daily. Start the day before Taxotere. Then again the day after chemo for 3 days. 30 tablet 1  . enalapril (VASOTEC) 20 MG tablet Take 1 tablet (20 mg total) by mouth at bedtime. 30 tablet 11  . fluticasone (FLONASE) 50 MCG/ACT nasal spray Place 1 spray into both nostrils as needed.    . lidocaine-prilocaine (EMLA) cream Apply to affected area once 30 g 3  . LORazepam (ATIVAN) 0.5 MG tablet Take 1 tablet (0.5 mg total) by mouth at bedtime. 30 tablet 0  . metoprolol succinate (TOPROL-XL)  50 MG 24 hr tablet Take 1 tablet (50 mg total) by mouth at bedtime. 30 tablet 11  . ondansetron (ZOFRAN) 8 MG tablet Take 1 tablet (8 mg total) by mouth 2 (two) times daily. Start the day after chemo for 3 days. Then take as needed for nausea or vomiting. 30 tablet 1  . oxyCODONE-acetaminophen (PERCOCET) 10-325 MG per tablet Take 1 tablet by mouth every 6 (six) hours as needed for pain. 10 tablet 0  . prochlorperazine (COMPAZINE) 10 MG tablet Take 1 tablet (10 mg total) by mouth every 6 (six) hours as needed (Nausea or vomiting). 30 tablet 1  . tobramycin-dexamethasone (TOBRADEX) ophthalmic solution Place 1 drop into both eyes every 6 (six) hours. (Patient not taking: Reported on 12/23/2014) 5 mL 1   No current facility-administered medications for this visit.    OBJECTIVE: Middle-aged African-American woman in no acute distress Filed Vitals:   12/23/14 1419  BP: 133/67  Pulse: 83  Temp: 98.2 F (36.8 C)  Resp: 20     Body mass index is 40.83 kg/(m^2).    ECOG FS:0 - Asymptomatic  Sclerae unicteric, pupils equal and  reactive Oropharynx clear and moist-- no thrush No cervical or supraclavicular adenopathy Lungs no rales or rhonchi Heart regular rate and rhythm Abd soft, nontender, positive bowel sounds MSK no focal spinal tenderness, no upper extremity lymphedema Neuro: nonfocal, well oriented, appropriate affect Breasts: deferred  LAB RESULTS:  CMP     Component Value Date/Time   NA 139 12/09/2014 1453   NA 137 09/08/2013 1120   K 3.9 12/09/2014 1453   K 3.6 09/08/2013 1120   CL 103 09/08/2013 1120   CO2 24 12/09/2014 1453   CO2 21 09/08/2013 1120   GLUCOSE 114 12/09/2014 1453   GLUCOSE 89 09/08/2013 1120   BUN 11.6 12/09/2014 1453   BUN 9 09/08/2013 1120   CREATININE 1.1 12/09/2014 1453   CREATININE 0.85 09/08/2013 1120   CREATININE 0.93 09/02/2013 1128   CALCIUM 9.0 12/09/2014 1453   CALCIUM 9.4 09/08/2013 1120   PROT 6.4 12/09/2014 1453   PROT 7.0 09/08/2013 1120   ALBUMIN 3.2* 12/09/2014 1453   ALBUMIN 3.4* 09/08/2013 1120   AST 13 12/09/2014 1453   AST 15 09/08/2013 1120   ALT 16 12/09/2014 1453   ALT 12 09/08/2013 1120   ALKPHOS 68 12/09/2014 1453   ALKPHOS 80 09/08/2013 1120   BILITOT 0.33 12/09/2014 1453   BILITOT 0.5 09/08/2013 1120   GFRNONAA 76* 09/08/2013 1120   GFRNONAA 70 09/02/2013 1128   GFRAA 88* 09/08/2013 1120   GFRAA 81 09/02/2013 1128    INo results found for: SPEP, UPEP  Lab Results  Component Value Date   WBC 6.5 12/09/2014   NEUTROABS 3.3 12/09/2014   HGB 14.3 12/09/2014   HCT 43.6 12/09/2014   MCV 91.6 12/09/2014   PLT 172 12/09/2014      Chemistry      Component Value Date/Time   NA 139 12/09/2014 1453   NA 137 09/08/2013 1120   K 3.9 12/09/2014 1453   K 3.6 09/08/2013 1120   CL 103 09/08/2013 1120   CO2 24 12/09/2014 1453   CO2 21 09/08/2013 1120   BUN 11.6 12/09/2014 1453   BUN 9 09/08/2013 1120   CREATININE 1.1 12/09/2014 1453   CREATININE 0.85 09/08/2013 1120   CREATININE 0.93 09/02/2013 1128      Component Value Date/Time    CALCIUM 9.0 12/09/2014 1453   CALCIUM 9.4 09/08/2013 1120  ALKPHOS 68 12/09/2014 1453   ALKPHOS 80 09/08/2013 1120   AST 13 12/09/2014 1453   AST 15 09/08/2013 1120   ALT 16 12/09/2014 1453   ALT 12 09/08/2013 1120   BILITOT 0.33 12/09/2014 1453   BILITOT 0.5 09/08/2013 1120       No results found for: LABCA2  No components found for: LABCA125  No results for input(s): INR in the last 168 hours.  Urinalysis    Component Value Date/Time   COLORURINE YELLOW 09/08/2013 [place] 09/08/2013 1258   LABSPEC 1.019 09/08/2013 1258   PHURINE 8.0 09/08/2013 1258   GLUCOSEU NEGATIVE 09/08/2013 1258   HGBUR TRACE* 09/08/2013 1258   BILIRUBINUR NEGATIVE 09/08/2013 1258   KETONESUR 40* 09/08/2013 1258   PROTEINUR NEGATIVE 09/08/2013 1258   UROBILINOGEN 0.2 09/08/2013 1258   NITRITE NEGATIVE 09/08/2013 1258   LEUKOCYTESUR NEGATIVE 09/08/2013 1258    STUDIES: Dg Chest Port 1 View  11/27/2014   CLINICAL DATA:  Postop.  EXAM: PORTABLE CHEST - 1 VIEW  COMPARISON:  03/25/2008  FINDINGS: Study dictated in exception status due to stat indication and clinician request. Technologist confirms correct patient and image.  Left subclavian porta catheter, tip at the SVC level.  There is mild pulmonary venous congestion which is likely from supine positioning. No pneumothorax or mediastinal widening. No evidence of collapse or aspiration.  Normal heart size and mediastinal contours for technique.  IMPRESSION: A left subclavian central line is in good position. No pneumothorax.   Electronically Signed   By: Monte Fantasia M.D.   On: 11/27/2014 12:17   Dg C-arm 1-60 Min-no Report  11/29/2014   : Fluoroscopy was utilized by the requesting physician. No radiographic interpretation.   Electronically Signed   ByPorfirio Mylar   On: 11/29/2014 14:59    ASSESSMENT: 59 y.o. [place], [place] woman status post right breast upper outer quadrant biopsy 11/12/2014 for a clinical T1c  N0, stage IA invasive ductal carcinoma, triple negative, with an MIB-1 of 19%  (1) neoadjuvant chemotherapy will consist of cyclophosphamide and docetaxel 4 given every 21 days with Neulasta support, first dose 12/02/2014  (2) genetics testing pending  (3) surgery to be guided by genetics results, and to take place at the conclusion of chemotherapy  (4) radiation to follow surgery  (5) continuing tobacco abuse. The patient has been strongly advised to quit smoking  PLAN: Lauretta looks and feels well today. The labs were reviewed in detail and were entirely stable. She will proceed with cycle 2 of cyclophosphamide and docetaxel as planned today.   I reminded Edna to take a longer course of claratin this week to hopefully ward of bone pain.   She will return in 1 week for labs and a nadir visit. She understands and agrees with this plan. She knows the goal of treatment in her case is cure. She has been encouraged to call with any issues that might arise before her next visit here.  Laurie Panda, NP   12/23/2014 3:04 PM

## 2014-12-23 NOTE — Patient Instructions (Signed)
[facility] Discharge Instructions for Patients Receiving Chemotherapy  Today you received the following chemotherapy agents Taxotere and Cytoxan.  To help prevent nausea and vomiting after your treatment, we encourage you to take your nausea medication as prescribed.   If you develop nausea and vomiting that is not controlled by your nausea medication, call the clinic.   BELOW ARE SYMPTOMS THAT SHOULD BE REPORTED IMMEDIATELY:  *FEVER GREATER THAN 100.5 F  *CHILLS WITH OR WITHOUT FEVER  NAUSEA AND VOMITING THAT IS NOT CONTROLLED WITH YOUR NAUSEA MEDICATION  *UNUSUAL SHORTNESS OF BREATH  *UNUSUAL BRUISING OR BLEEDING  TENDERNESS IN MOUTH AND THROAT WITH OR WITHOUT PRESENCE OF ULCERS  *URINARY PROBLEMS  *BOWEL PROBLEMS  UNUSUAL RASH Items with * indicate a potential emergency and should be followed up as soon as possible.  Feel free to call the clinic you have any questions or concerns. The clinic phone number is [phone].    

## 2014-12-24 ENCOUNTER — Ambulatory Visit: Payer: BLUE CROSS/BLUE SHIELD

## 2014-12-25 ENCOUNTER — Other Ambulatory Visit: Payer: Self-pay | Admitting: Cardiology

## 2014-12-25 MED ORDER — CHLORTHALIDONE 25 MG PO TABS: 25.0000 mg | ORAL_TABLET | ORAL | Status: DC

## 2014-12-25 NOTE — Telephone Encounter (Signed)
°  1. Which medications need to be refilled? Chloerthalidone 25mg   2. Which pharmacy is medication to be sent to?[facility]  3. Do they need a 30 day or 90 day supply? 30  4. Would they like a call back once the medication has been sent to the pharmacy? Yes  *SHE STATES THAT SHE IS UNABLE TO [facility] AN APPT AT THIS TIME BECAUSE SHE WAS JUST DIAGNOSED WITH BREAST CANCER AND HER ONCOLOGIST DOES NOT WANT HER SEEING ANY OTHER DOCTORS UNTIL HER CHEMO IS FINISHED*

## 2014-12-25 NOTE — Telephone Encounter (Signed)
Rx(s) sent to pharmacy electronically. Patient notified. 

## 2014-12-30 ENCOUNTER — Ambulatory Visit (HOSPITAL_BASED_OUTPATIENT_CLINIC_OR_DEPARTMENT_OTHER): Payer: BLUE CROSS/BLUE SHIELD | Admitting: Nurse Practitioner

## 2014-12-30 ENCOUNTER — Ambulatory Visit (HOSPITAL_BASED_OUTPATIENT_CLINIC_OR_DEPARTMENT_OTHER): Payer: BLUE CROSS/BLUE SHIELD

## 2014-12-30 ENCOUNTER — Other Ambulatory Visit (HOSPITAL_BASED_OUTPATIENT_CLINIC_OR_DEPARTMENT_OTHER): Payer: BLUE CROSS/BLUE SHIELD

## 2014-12-30 ENCOUNTER — Encounter: Payer: Self-pay | Admitting: Nurse Practitioner

## 2014-12-30 ENCOUNTER — Telehealth: Payer: Self-pay | Admitting: Nurse Practitioner

## 2014-12-30 VITALS — BP 125/71 | HR 99 | Temp 99.2°F | Resp 18 | Ht 61.0 in | Wt 211.5 lb

## 2014-12-30 DIAGNOSIS — Z720 Tobacco use: Secondary | ICD-10-CM

## 2014-12-30 DIAGNOSIS — E860 Dehydration: Secondary | ICD-10-CM

## 2014-12-30 DIAGNOSIS — C50411 Malignant neoplasm of upper-outer quadrant of right female breast: Secondary | ICD-10-CM | POA: Diagnosis not present

## 2014-12-30 DIAGNOSIS — Z171 Estrogen receptor negative status [ER-]: Secondary | ICD-10-CM

## 2014-12-30 LAB — CBC WITH DIFFERENTIAL/PLATELET
BASO%: 0.6 % (ref 0.0–2.0)
BASOS ABS: 0 10*3/uL (ref 0.0–0.1)
EOS ABS: 0.1 10*3/uL (ref 0.0–0.5)
EOS%: 0.9 % (ref 0.0–7.0)
HEMATOCRIT: 36.7 % (ref 34.8–46.6)
HEMOGLOBIN: 12.3 g/dL (ref 11.6–15.9)
LYMPH#: 2.1 10*3/uL (ref 0.9–3.3)
LYMPH%: 31.9 % (ref 14.0–49.7)
MCH: 30.2 pg (ref 25.1–34.0)
MCHC: 33.5 g/dL (ref 31.5–36.0)
MCV: 90.2 fL (ref 79.5–101.0)
MONO#: 1.7 10*3/uL — AB (ref 0.1–0.9)
MONO%: 25.6 % — ABNORMAL HIGH (ref 0.0–14.0)
NEUT%: 41 % (ref 38.4–76.8)
NEUTROS ABS: 2.7 10*3/uL (ref 1.5–6.5)
Platelets: 160 10*3/uL (ref 145–400)
RBC: 4.07 10*6/uL (ref 3.70–5.45)
RDW: 15.4 % — ABNORMAL HIGH (ref 11.2–14.5)
WBC: 6.6 10*3/uL (ref 3.9–10.3)

## 2014-12-30 LAB — COMPREHENSIVE METABOLIC PANEL (CC13)
ALK PHOS: 69 U/L (ref 40–150)
ALT: 31 U/L (ref 0–55)
ANION GAP: 12 meq/L — AB (ref 3–11)
AST: 14 U/L (ref 5–34)
Albumin: 3.1 g/dL — ABNORMAL LOW (ref 3.5–5.0)
BILIRUBIN TOTAL: 0.49 mg/dL (ref 0.20–1.20)
BUN: 9.9 mg/dL (ref 7.0–26.0)
CHLORIDE: 103 meq/L (ref 98–109)
CO2: 26 meq/L (ref 22–29)
Calcium: 9 mg/dL (ref 8.4–10.4)
Creatinine: 0.8 mg/dL (ref 0.6–1.1)
GLUCOSE: 105 mg/dL (ref 70–140)
POTASSIUM: 3.6 meq/L (ref 3.5–5.1)
Sodium: 141 mEq/L (ref 136–145)
TOTAL PROTEIN: 6.2 g/dL — AB (ref 6.4–8.3)

## 2014-12-30 LAB — TECHNOLOGIST REVIEW

## 2014-12-30 MED ORDER — SODIUM CHLORIDE 0.9 % IJ SOLN
10.0000 mL | INTRAMUSCULAR | Status: DC | PRN
  Administered 2014-12-30: 10 mL
  Filled 2014-12-30: qty 10

## 2014-12-30 MED ORDER — HEPARIN SOD (PORK) LOCK FLUSH 100 UNIT/ML IV SOLN
500.0000 [IU] | Freq: Once | INTRAVENOUS | Status: AC | PRN
  Administered 2014-12-30: 500 [IU]
  Filled 2014-12-30: qty 5

## 2014-12-30 MED ORDER — SODIUM CHLORIDE 0.9 % IV SOLN
Freq: Once | INTRAVENOUS | Status: AC
  Administered 2014-12-30: 15:00:00 via INTRAVENOUS

## 2014-12-30 NOTE — Progress Notes (Signed)
[facility]  [phone] [phone]) [phone]     ID: Katie Jacobson DOB: 10/07/1956  MR#: [id number]  YTK#:[id number]  Patient Care Team: Debbrah Alar, NP as PCP - General (Internal Medicine) Harle Battiest, MD (Obstetrics and Gynecology) Rolm Bookbinder, MD as Consulting Physician (General Surgery) Chauncey Cruel, MD as Consulting Physician (Oncology) Eppie Gibson, MD as Attending Physician (Radiation Oncology) Rockwell Germany, RN as Registered Nurse Mauro Kaufmann, RN as Registered Nurse Holley Bouche, NP as Nurse Practitioner (Nurse Practitioner) OTHER MD:  CHIEF COMPLAINT: Triple negative breast cancer  CURRENT TREATMENT: Neoadjuvant chemotherapy   BREAST CANCER HISTORY: From the original intake note:  The patient herself palpated a mass in her right breast there is she brought it to her gynecologist, Dr. Alan Ripper attention and he obtained screening mammography which suggested an abnormality in the right breast. On 10/24/2014 the patient underwent right diagnostic mammography and ultrasonography at the breast Center. The breast density was category B. There was an ill-defined mass in the upper outer quadrant of the right breast which was palpable on exam. By ultrasound this was irregular and hypoechoic measuring 1.2 cm. Evaluation of the right axilla was negative.  Biopsy of the mass in question showed an invasive ductal carcinoma, grade 2 or 3, triple negative, with an MIB-1 of 19%.  [name] case was presented at the multidisciplinary breast cancer conference 11/19/2014, where was suggested she would benefit from genetics testing. Since this would mandate a significant delay in her final surgery, neoadjuvant chemotherapy was indicated.  The patient's subsequent history is as detailed below  INTERVAL HISTORY: [name] returns today for follow-up of her breast cancer, alone. Today is day 8, cycle 2 of cyclophosphamide and docetaxel, with  neulasta given on day 2 for granulocyte support. Teretha is not doing well today. She just buried her brother on Sunday. She has been having chills, but no fever, and is visibly shaking in office today. Her temperature was 99.2. She has had tremendous pain despite using claratin after the neulasta injection. She describes it as throbbing all over. She was forced to take some of her percocet last night for pain, but hadn't taken any today. She can't taste anything and it is frustrating her. She brushes her teeth every 30 minutes, but the fresh sensation does not last for long. She denies thrush or mouth sores.  REVIEW OF SYSTEMS: A detailed review of systems was otherwise stable, except where noted above.  PAST MEDICAL HISTORY: Past Medical History  Diagnosis Date  . Heart palpitations   . Osteoporosis   . Essential hypertension 04/01/2011  . History of partial seizures 1970    never knew why  . Mitral valve prolapse 1990  . Personal history of kidney stones 05/2012  . Obesity (BMI 35.0-39.9 without comorbidity)   . Breast cancer of upper-outer quadrant of right female breast 11/14/2014  . Family history of breast cancer     PAST SURGICAL HISTORY: Past Surgical History  Procedure Laterality Date  . Appendectomy  1976  . Abdominal hysterectomy  1998    ovaries spared  . Tonsillectomy  1970s  . Knee arthroscopy  2006    left  knee  . Cystectomy      Pt reports removal of a "stone" from the right side of her throat as a child  . Tubal ligation  1996  . Foot sx      X2  . Doppler echocardiography  03/27/1989    suggestive eidence of  mitral valve proplapse miminal ; trace mitral regurgitation  . Nm myoview ltd  07/01/1996    EF 62%  . Salivary stone removal  1972    right neck  . Colonoscopy    . Portacath placement N/A 11/27/2014    Procedure: INSERTION PORT-A-CATH;  Surgeon: Rolm Bookbinder, MD;  Location: [facility];  Service: General;  Laterality: N/A;    FAMILY  HISTORY Family History  Problem Relation Age of Onset  . Heart disease Mother   . Stroke Mother   . Hypertension Mother   . Diabetes Mother   . Diabetes Sister   . Kidney disease Brother   . Mental retardation Brother   . Breast cancer Cousin     deceased 57  . Diabetes Sister   . Colon cancer Neg Hx   . Esophageal cancer Neg Hx   . Stomach cancer Neg Hx   . Heart attack Father    the patient knows little about her father. Her mother died at at the age of 25 following a stroke. The patient had 2 brothers, 2 sisters. The only history of breast or ovarian cancer in the family is a first cousin on the mother's side diagnosed with breast cancer at age 68  GYNECOLOGIC HISTORY:  Patient's last menstrual period was 10/10/1996. Menarche age 81, first live birth age 63. The patient is GX P2. She status post simple hysterectomy without salpingo-oophorectomy. She did not take hormone replacement.  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 The patient's husband, Jori Moll "Ron" Pundt works for [employer] in the pattern shop. He also works as a Dispensing optician. Daughter Teauna Dubach lives in [place] and works in Programmer, applications. Daughter [name] Galanti lives in [place] and works at a [employer]. 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    ADVANCED DIRECTIVES: Not in place   HEALTH MAINTENANCE: History  Substance Use Topics  . Smoking status: Current Every Day Smoker -- 0.50 packs/day for 39 years    Types: Cigarettes  . Smokeless tobacco: Never Used  . Alcohol Use: 4.2 oz/week    7 Cans of beer per week     Colonoscopy:  PAP:  Bone density:  Lipid panel:  Allergies  Allergen Reactions  . Sulfa Antibiotics Other (See Comments)    Unknown reaction    Current Outpatient Prescriptions  Medication Sig Dispense Refill  . chlorthalidone (HYGROTON) 25 MG tablet Take 1 tablet (25 mg total) by mouth every other day. 30  tablet 3  . enalapril (VASOTEC) 20 MG tablet Take 1 tablet (20 mg total) by mouth at bedtime. 30 tablet 11  . fluticasone (FLONASE) 50 MCG/ACT nasal spray Place 1 spray into both nostrils as needed.    Marland Kitchen LORazepam (ATIVAN) 0.5 MG tablet Take 1 tablet (0.5 mg total) by mouth at bedtime. 30 tablet 0  . metoprolol succinate (TOPROL-XL) 50 MG 24 hr tablet Take 1 tablet (50 mg total) by mouth at bedtime. 30 tablet 11  . ondansetron (ZOFRAN) 8 MG tablet Take 1 tablet (8 mg total) by mouth 2 (two) times daily. Start the day after chemo for 3 days. Then take as needed for nausea or vomiting. 30 tablet 1  . oxyCODONE-acetaminophen (PERCOCET) 10-325 MG per tablet Take 1 tablet by mouth every 6 (six) hours as needed for pain. 10 tablet 0  . albuterol (PROVENTIL HFA;VENTOLIN HFA) 108 (90 BASE) MCG/ACT inhaler Inhale into the  lungs every 6 (six) hours as needed for wheezing or shortness of breath.    . dexamethasone (DECADRON) 4 MG tablet Take 2 tablets (8 mg total) by mouth 2 (two) times daily. Start the day before Taxotere. Then again the day after chemo for 3 days. (Patient not taking: Reported on 12/30/2014) 30 tablet 1  . lidocaine-prilocaine (EMLA) cream Apply to affected area once (Patient not taking: Reported on 12/30/2014) 30 g 3  . prochlorperazine (COMPAZINE) 10 MG tablet Take 1 tablet (10 mg total) by mouth every 6 (six) hours as needed (Nausea or vomiting). (Patient not taking: Reported on 12/30/2014) 30 tablet 1  . tobramycin-dexamethasone (TOBRADEX) ophthalmic solution Place 1 drop into both eyes every 6 (six) hours. (Patient not taking: Reported on 12/23/2014) 5 mL 1   No current facility-administered medications for this visit.   Facility-Administered Medications Ordered in Other Visits  Medication Dose Route Frequency Provider Last Rate Last Dose  . heparin lock flush 100 unit/mL  500 Units Intracatheter Once PRN Laurie Panda, NP      . sodium chloride 0.9 % injection 10 mL  10 mL  Intracatheter PRN Laurie Panda, NP        OBJECTIVE: Middle-aged African-American woman in no acute distress Filed Vitals:   12/30/14 1346  BP: 125/71  Pulse: 99  Temp: 99.2 F (37.3 C)  Resp: 18     Body mass index is 39.98 kg/(m^2).    ECOG FS:0 - Asymptomatic  Skin: warm, dry  HEENT: sclerae anicteric, conjunctivae pink, oropharynx clear. No thrush or mucositis.  Lymph Nodes: No cervical or supraclavicular lymphadenopathy  Lungs: clear to auscultation bilaterally, no rales, wheezes, or rhonci  Heart: regular rate and rhythm  Abdomen: round, soft, non tender, positive bowel sounds  Musculoskeletal: No focal spinal tenderness, no peripheral edema  Neuro: non focal, well oriented, positive affect  Breasts: deferred  LAB RESULTS:  CMP     Component Value Date/Time   NA 141 12/30/2014 1331   NA 137 09/08/2013 1120   K 3.6 12/30/2014 1331   K 3.6 09/08/2013 1120   CL 103 09/08/2013 1120   CO2 26 12/30/2014 1331   CO2 21 09/08/2013 1120   GLUCOSE 105 12/30/2014 1331   GLUCOSE 89 09/08/2013 1120   BUN 9.9 12/30/2014 1331   BUN 9 09/08/2013 1120   CREATININE 0.8 12/30/2014 1331   CREATININE 0.85 09/08/2013 1120   CREATININE 0.93 09/02/2013 1128   CALCIUM 9.0 12/30/2014 1331   CALCIUM 9.4 09/08/2013 1120   PROT 6.2* 12/30/2014 1331   PROT 7.0 09/08/2013 1120   ALBUMIN 3.1* 12/30/2014 1331   ALBUMIN 3.4* 09/08/2013 1120   AST 14 12/30/2014 1331   AST 15 09/08/2013 1120   ALT 31 12/30/2014 1331   ALT 12 09/08/2013 1120   ALKPHOS 69 12/30/2014 1331   ALKPHOS 80 09/08/2013 1120   BILITOT 0.49 12/30/2014 1331   BILITOT 0.5 09/08/2013 1120   GFRNONAA 76* 09/08/2013 1120   GFRNONAA 70 09/02/2013 1128   GFRAA 88* 09/08/2013 1120   GFRAA 81 09/02/2013 1128    INo results found for: SPEP, UPEP  Lab Results  Component Value Date   WBC 6.6 12/30/2014   NEUTROABS 2.7 12/30/2014   HGB 12.3 12/30/2014   HCT 36.7 12/30/2014   MCV 90.2 12/30/2014   PLT 160  12/30/2014      Chemistry      Component Value Date/Time   NA 141 12/30/2014 1331   NA 137 09/08/2013  1120   K 3.6 12/30/2014 1331   K 3.6 09/08/2013 1120   CL 103 09/08/2013 1120   CO2 26 12/30/2014 1331   CO2 21 09/08/2013 1120   BUN 9.9 12/30/2014 1331   BUN 9 09/08/2013 1120   CREATININE 0.8 12/30/2014 1331   CREATININE 0.85 09/08/2013 1120   CREATININE 0.93 09/02/2013 1128      Component Value Date/Time   CALCIUM 9.0 12/30/2014 1331   CALCIUM 9.4 09/08/2013 1120   ALKPHOS 69 12/30/2014 1331   ALKPHOS 80 09/08/2013 1120   AST 14 12/30/2014 1331   AST 15 09/08/2013 1120   ALT 31 12/30/2014 1331   ALT 12 09/08/2013 1120   BILITOT 0.49 12/30/2014 1331   BILITOT 0.5 09/08/2013 1120       No results found for: LABCA2  No components found for: LABCA125  No results for input(s): INR in the last 168 hours.  Urinalysis    Component Value Date/Time   COLORURINE YELLOW 09/08/2013 [place] 09/08/2013 1258   LABSPEC 1.019 09/08/2013 1258   PHURINE 8.0 09/08/2013 1258   GLUCOSEU NEGATIVE 09/08/2013 1258   HGBUR TRACE* 09/08/2013 [place] 09/08/2013 1258   KETONESUR 40* 09/08/2013 1258   PROTEINUR NEGATIVE 09/08/2013 1258   UROBILINOGEN 0.2 09/08/2013 1258   NITRITE NEGATIVE 09/08/2013 1258   LEUKOCYTESUR NEGATIVE 09/08/2013 1258    STUDIES: No results found.  ASSESSMENT: 59 y.o. [place], [place] woman status post right breast upper outer quadrant biopsy 11/12/2014 for a clinical T1c N0, stage IA invasive ductal carcinoma, triple negative, with an MIB-1 of 19%  (1) neoadjuvant chemotherapy will consist of cyclophosphamide and docetaxel 4 given every 21 days with Neulasta support, first dose 12/02/2014  (2) genetics testing pending  (3) surgery to be guided by genetics results, and to take place at the conclusion of chemotherapy  (4) radiation to follow surgery  (5) continuing tobacco abuse. The patient has been  strongly advised to quit smoking  PLAN: Cache was tearful during our visit, but after some warming blankets she was able to calm down and stop shaking. The labs were reviewed in detail and were entirely stable. Her [place] remains normal at 2.7. She is afebrile.   I am setting her up to receive IVF to see if this will improve her condition. She will try lemon juice or a tsp of vinegar in her food to see if it improves the taste.  [place] will return in 2 weeks for the start of cycle 3. She understands and agrees with this plan. She knows the goal of treatment in her case is cure. She has been encouraged to call with any issues that might arise before her next visit here.   Laurie Panda, NP   12/30/2014 3:01 PM

## 2014-12-30 NOTE — Telephone Encounter (Signed)
IVF 2 hours added at 2:30 today per 03/22 POF..... KJ

## 2014-12-30 NOTE — Patient Instructions (Signed)
Dehydration, Adult Dehydration is when you lose more fluids from the body than you take in. Vital organs like the kidneys, brain, and heart cannot function without a proper amount of fluids and salt. Any loss of fluids from the body can cause dehydration.  CAUSES   Vomiting.  Diarrhea.  Excessive sweating.  Excessive urine output.  Fever. SYMPTOMS  Mild dehydration  Thirst.  Dry lips.  Slightly dry mouth. Moderate dehydration  Very dry mouth.  Sunken eyes.  Skin does not bounce back quickly when lightly pinched and released.  Dark urine and decreased urine production.  Decreased tear production.  Headache. Severe dehydration  Very dry mouth.  Extreme thirst.  Rapid, weak pulse (more than 100 beats per minute at rest).  Cold hands and feet.  Not able to sweat in spite of heat and temperature.  Rapid breathing.  Blue lips.  Confusion and lethargy.  Difficulty being awakened.  Minimal urine production.  No tears. DIAGNOSIS  Your caregiver will diagnose dehydration based on your symptoms and your exam. Blood and urine tests will help confirm the diagnosis. The diagnostic evaluation should also identify the cause of dehydration. TREATMENT  Treatment of mild or moderate dehydration can often be done at home by increasing the amount of fluids that you drink. It is best to drink small amounts of fluid more often. Drinking too much at one time can make vomiting worse. Refer to the home care instructions below. Severe dehydration needs to be treated at the hospital where you will probably be given intravenous (IV) fluids that contain water and electrolytes. HOME CARE INSTRUCTIONS   Ask your caregiver about specific rehydration instructions.  Drink enough fluids to keep your urine clear or pale yellow.  Drink small amounts frequently if you have nausea and vomiting.  Eat as you normally do.  Avoid:  Foods or drinks high in sugar.  Carbonated  drinks.  Juice.  Extremely hot or cold fluids.  Drinks with caffeine.  Fatty, greasy foods.  Alcohol.  Tobacco.  Overeating.  Gelatin desserts.  Wash your hands well to avoid spreading bacteria and viruses.  Only take over-the-counter or prescription medicines for pain, discomfort, or fever as directed by your caregiver.  Ask your caregiver if you should continue all prescribed and over-the-counter medicines.  Keep all follow-up appointments with your caregiver. SEEK MEDICAL CARE IF:  You have abdominal pain and it increases or stays in one area (localizes).  You have a rash, stiff neck, or severe headache.  You are irritable, sleepy, or difficult to awaken.  You are weak, dizzy, or extremely thirsty. SEEK IMMEDIATE MEDICAL CARE IF:   You are unable to keep fluids down or you get worse despite treatment.  You have frequent episodes of vomiting or diarrhea.  You have blood or green matter (bile) in your vomit.  You have blood in your stool or your stool looks black and tarry.  You have not urinated in 6 to 8 hours, or you have only urinated a small amount of very dark urine.  You have a fever.  You faint. MAKE SURE YOU:   Understand these instructions.  Will watch your condition.  Will get help right away if you are not doing well or get worse. Document Released: 09/26/2005 Document Revised: 12/19/2011 Document Reviewed: 05/16/2011 ExitCare Patient Information 2015 [employer]. This information is not intended to replace advice given to you by your health care provider. Make sure you discuss any questions you have with your health care   provider.  

## 2015-01-08 ENCOUNTER — Telehealth: Payer: Self-pay | Admitting: *Deleted

## 2015-01-08 NOTE — Telephone Encounter (Signed)
Per Selena Lesser, NP, patient should take Benadryl 25 mg every six hours and Pepcid 20 mg every 12 hours. Also, I asked patient to call [facility] tomorrow and let us know how she is feeling in case she needs to be seen by Cyndee, NP. Instructed patient to use Aquaphor lotion. Patient verbalized understanding.

## 2015-01-08 NOTE — Telephone Encounter (Signed)
Patient called and stated,"i have a rash on back of my hands, the palms itch, rash on inner thighs, and heels of feet are tender. Is there anything I can do for this?" Return number is [phone]. Instructed patient I would call her back with information. Patient verbalized understanding.

## 2015-01-09 ENCOUNTER — Telehealth: Payer: Self-pay

## 2015-01-09 NOTE — Telephone Encounter (Signed)
Pt called that her benadryl and pepcid helped a lot on her itching from yesterday. Asking when to stop it. She is to receive chemo on 4/5 so told her to stop on Sunday. She will see Heather on 4/5 and can get further instruction at that time. Pt confirmed appt time.

## 2015-01-12 ENCOUNTER — Telehealth: Payer: Self-pay | Admitting: *Deleted

## 2015-01-12 NOTE — Telephone Encounter (Signed)
DOES PT. CONTINUE THE PEPCID AND BENADRYL WHICH HAVE HELPED HER SYMPTOMS SINCE SHE IS TO START THE DEXAMETHASONE AND LORAZEPAM TODAY BEFORE HER CHEMO ON 01/13/15? THIS NOTE ROUTED TO VAL DODD,RN.

## 2015-01-12 NOTE — Telephone Encounter (Signed)
This RN spoke with pt and advised her to continue the pepcid but stop the benadryl.  Per visit tomorrow with Mrs Lucrezia Starch NP she will be further advised.  Pt verbalized understanding.

## 2015-01-13 ENCOUNTER — Ambulatory Visit (HOSPITAL_BASED_OUTPATIENT_CLINIC_OR_DEPARTMENT_OTHER): Payer: BLUE CROSS/BLUE SHIELD

## 2015-01-13 ENCOUNTER — Ambulatory Visit (HOSPITAL_BASED_OUTPATIENT_CLINIC_OR_DEPARTMENT_OTHER): Payer: BLUE CROSS/BLUE SHIELD | Admitting: Nurse Practitioner

## 2015-01-13 ENCOUNTER — Other Ambulatory Visit: Payer: BLUE CROSS/BLUE SHIELD

## 2015-01-13 ENCOUNTER — Other Ambulatory Visit (HOSPITAL_BASED_OUTPATIENT_CLINIC_OR_DEPARTMENT_OTHER): Payer: BLUE CROSS/BLUE SHIELD

## 2015-01-13 ENCOUNTER — Encounter: Payer: Self-pay | Admitting: Nurse Practitioner

## 2015-01-13 ENCOUNTER — Ambulatory Visit: Payer: BLUE CROSS/BLUE SHIELD

## 2015-01-13 VITALS — BP 120/69 | HR 86 | Temp 97.4°F | Resp 18 | Ht 61.0 in | Wt 212.9 lb

## 2015-01-13 DIAGNOSIS — Z5189 Encounter for other specified aftercare: Secondary | ICD-10-CM | POA: Diagnosis not present

## 2015-01-13 DIAGNOSIS — Z5111 Encounter for antineoplastic chemotherapy: Secondary | ICD-10-CM

## 2015-01-13 DIAGNOSIS — C50411 Malignant neoplasm of upper-outer quadrant of right female breast: Secondary | ICD-10-CM

## 2015-01-13 DIAGNOSIS — Z171 Estrogen receptor negative status [ER-]: Secondary | ICD-10-CM

## 2015-01-13 DIAGNOSIS — Z720 Tobacco use: Secondary | ICD-10-CM

## 2015-01-13 DIAGNOSIS — R12 Heartburn: Secondary | ICD-10-CM | POA: Diagnosis not present

## 2015-01-13 LAB — CBC WITH DIFFERENTIAL/PLATELET
BASO%: 0 % (ref 0.0–2.0)
BASOS ABS: 0 10*3/uL (ref 0.0–0.1)
EOS%: 0 % (ref 0.0–7.0)
Eosinophils Absolute: 0 10*3/uL (ref 0.0–0.5)
HEMATOCRIT: 34.8 % (ref 34.8–46.6)
HGB: 12 g/dL (ref 11.6–15.9)
LYMPH%: 7.7 % — AB (ref 14.0–49.7)
MCH: 31.5 pg (ref 25.1–34.0)
MCHC: 34.5 g/dL (ref 31.5–36.0)
MCV: 91.3 fL (ref 79.5–101.0)
MONO#: 0.4 10*3/uL (ref 0.1–0.9)
MONO%: 4.4 % (ref 0.0–14.0)
NEUT#: 7.4 10*3/uL — ABNORMAL HIGH (ref 1.5–6.5)
NEUT%: 87.9 % — AB (ref 38.4–76.8)
PLATELETS: 282 10*3/uL (ref 145–400)
RBC: 3.81 10*6/uL (ref 3.70–5.45)
RDW: 16.8 % — ABNORMAL HIGH (ref 11.2–14.5)
WBC: 8.4 10*3/uL (ref 3.9–10.3)
lymph#: 0.7 10*3/uL — ABNORMAL LOW (ref 0.9–3.3)

## 2015-01-13 LAB — COMPREHENSIVE METABOLIC PANEL (CC13)
ALBUMIN: 3.3 g/dL — AB (ref 3.5–5.0)
ALT: 13 U/L (ref 0–55)
ANION GAP: 10 meq/L (ref 3–11)
AST: 11 U/L (ref 5–34)
Alkaline Phosphatase: 65 U/L (ref 40–150)
BILIRUBIN TOTAL: 0.35 mg/dL (ref 0.20–1.20)
BUN: 17.6 mg/dL (ref 7.0–26.0)
CALCIUM: 9.5 mg/dL (ref 8.4–10.4)
CHLORIDE: 109 meq/L (ref 98–109)
CO2: 23 mEq/L (ref 22–29)
Creatinine: 0.8 mg/dL (ref 0.6–1.1)
GLUCOSE: 99 mg/dL (ref 70–140)
Potassium: 4.7 mEq/L (ref 3.5–5.1)
SODIUM: 143 meq/L (ref 136–145)
TOTAL PROTEIN: 6.6 g/dL (ref 6.4–8.3)

## 2015-01-13 MED ORDER — HEPARIN SOD (PORK) LOCK FLUSH 100 UNIT/ML IV SOLN
500.0000 [IU] | Freq: Once | INTRAVENOUS | Status: AC | PRN
  Administered 2015-01-13: 500 [IU]
  Filled 2015-01-13: qty 5

## 2015-01-13 MED ORDER — SODIUM CHLORIDE 0.9 % IJ SOLN
10.0000 mL | INTRAMUSCULAR | Status: DC | PRN
  Administered 2015-01-13: 10 mL
  Filled 2015-01-13: qty 10

## 2015-01-13 MED ORDER — PEGFILGRASTIM 6 MG/0.6ML ~~LOC~~ PSKT
6.0000 mg | PREFILLED_SYRINGE | Freq: Once | SUBCUTANEOUS | Status: AC
  Administered 2015-01-13: 6 mg via SUBCUTANEOUS
  Filled 2015-01-13: qty 0.6

## 2015-01-13 MED ORDER — SODIUM CHLORIDE 0.9 % IV SOLN
600.0000 mg/m2 | Freq: Once | INTRAVENOUS | Status: AC
  Administered 2015-01-13: 1240 mg via INTRAVENOUS
  Filled 2015-01-13: qty 62

## 2015-01-13 MED ORDER — DOCETAXEL CHEMO INJECTION 160 MG/16ML
75.0000 mg/m2 | Freq: Once | INTRAVENOUS | Status: AC
  Administered 2015-01-13: 150 mg via INTRAVENOUS
  Filled 2015-01-13: qty 15

## 2015-01-13 MED ORDER — SODIUM CHLORIDE 0.9 % IV SOLN
Freq: Once | INTRAVENOUS | Status: AC
  Administered 2015-01-13: 15:00:00 via INTRAVENOUS
  Filled 2015-01-13: qty 8

## 2015-01-13 MED ORDER — SODIUM CHLORIDE 0.9 % IV SOLN
Freq: Once | INTRAVENOUS | Status: AC
  Administered 2015-01-13: 15:00:00 via INTRAVENOUS

## 2015-01-13 NOTE — Patient Instructions (Signed)
[facility] Discharge Instructions for Patients Receiving Chemotherapy  Today you received the following chemotherapy agents Taxotere and Cytoxan.  To help prevent nausea and vomiting after your treatment, we encourage you to take your nausea medication Compazine 10 mg every 6 hours as needed  If you develop nausea and vomiting that is not controlled by your nausea medication, call the clinic.   BELOW ARE SYMPTOMS THAT SHOULD BE REPORTED IMMEDIATELY:  *FEVER GREATER THAN 100.5 F  *CHILLS WITH OR WITHOUT FEVER  NAUSEA AND VOMITING THAT IS NOT CONTROLLED WITH YOUR NAUSEA MEDICATION  *UNUSUAL SHORTNESS OF BREATH  *UNUSUAL BRUISING OR BLEEDING  TENDERNESS IN MOUTH AND THROAT WITH OR WITHOUT PRESENCE OF ULCERS  *URINARY PROBLEMS  *BOWEL PROBLEMS  UNUSUAL RASH Items with * indicate a potential emergency and should be followed up as soon as possible.  Feel free to call the clinic you have any questions or concerns. The clinic phone number is [phone].  Please show the [facility] at check-in to the Emergency Department and triage nurse.

## 2015-01-13 NOTE — Progress Notes (Signed)
Katie Jacobson  Telephone:([phone]) [phone] Fax:(336) [phone]     ID: Katie Jacobson DOB: 1956/06/17  MR#: [id number]  YTK#:[id number]  Patient Care Team: Debbrah Alar, NP as PCP - General (Internal Medicine) Harle Battiest, MD (Obstetrics and Gynecology) Rolm Bookbinder, MD as Consulting Physician (General Surgery) Chauncey Cruel, MD as Consulting Physician (Oncology) Eppie Gibson, MD as Attending Physician (Radiation Oncology) Rockwell [place], RN as Registered Nurse Mauro Kaufmann, RN as Registered Nurse Holley Bouche, NP as Nurse Practitioner (Nurse Practitioner) OTHER MD:  CHIEF COMPLAINT: Triple negative breast cancer  CURRENT TREATMENT: Neoadjuvant chemotherapy   BREAST CANCER HISTORY: From the original intake note:  The patient herself palpated a mass in her right breast there is she brought it to her gynecologist, Dr. Alan Ripper attention and he obtained screening mammography which suggested an abnormality in the right breast. On 10/24/2014 the patient underwent right diagnostic mammography and ultrasonography at the breast Center. The breast density was category B. There was an ill-defined mass in the upper outer quadrant of the right breast which was palpable on exam. By ultrasound this was irregular and hypoechoic measuring 1.2 cm. Evaluation of the right axilla was negative.  Biopsy of the mass in question showed an invasive ductal carcinoma, grade 2 or 3, triple negative, with an MIB-1 of 19%.  Katie Jacobson case was presented at the multidisciplinary breast cancer conference 11/19/2014, where was suggested she would benefit from genetics testing. Since this would mandate a significant delay in her final surgery, neoadjuvant chemotherapy was indicated.  The patient's subsequent history is as detailed below  INTERVAL HISTORY: Katie Jacobson returns today for follow-up of her breast cancer, accompanied by her husband. Today is day 1, cycle 3 of cyclophosphamide  and docetaxel, with neulasta given on day 2 for granulocyte support. The IV fluids given to her last week was helpful, and she has  The interval history was remarkable for taxane-associated hand and foot syndrome. She called the [facility] on 3/31 with complaints of a rash to the back of her hands, pruritus to her palms, and tenderness to her feet. She was advised to take benadryl every 6 hrs, and pepcid every 12 hrs. This relieve her symptoms within days.   REVIEW OF SYSTEMS: Katie Jacobson denies fevers, chills, nausea, vomiting, or changes in bowel or bladder habits. She is eating and drinking well. She does have some heartburn, and the pecid controlled this, but she stopped after her rash disappeared. She denies mouth sores. She sleeps well with benadryl or lorazepam. Her energy level is fair during the day. She denies shortness of breath, chest pain, cough, or palpitations. A detailed review of systems was otherwise stable.   PAST MEDICAL HISTORY: Past Medical History  Diagnosis Date  . Heart palpitations   . Osteoporosis   . Essential hypertension 04/01/2011  . History of partial seizures 1970    never knew why  . Mitral valve prolapse 1990  . Personal history of kidney stones 05/2012  . Obesity (BMI 35.0-39.9 without comorbidity)   . Breast cancer of upper-outer quadrant of right female breast 11/14/2014  . Family history of breast cancer     PAST SURGICAL HISTORY: Past Surgical History  Procedure Laterality Date  . Appendectomy  1976  . Abdominal hysterectomy  1998    ovaries spared  . Tonsillectomy  1970s  . Knee arthroscopy  2006    left  knee  . Cystectomy      Pt reports removal of a "stone"  from the right side of her throat as a child  . Tubal ligation  1996  . Foot sx      X2  . Doppler echocardiography  03/27/1989    suggestive eidence of mitral valve proplapse miminal ; trace mitral regurgitation  . Nm myoview ltd  07/01/1996    EF 62%  . Salivary stone removal  1972     right neck  . Colonoscopy    . Portacath placement N/A 11/27/2014    Procedure: INSERTION PORT-A-CATH;  Surgeon: Rolm Bookbinder, MD;  Location: [facility];  Service: General;  Laterality: N/A;    FAMILY HISTORY Family History  Problem Relation Age of Onset  . Heart disease Mother   . Stroke Mother   . Hypertension Mother   . Diabetes Mother   . Diabetes Sister   . Kidney disease Brother   . Mental retardation Brother   . Breast cancer Cousin     deceased 19  . Diabetes Sister   . Colon cancer Neg Hx   . Esophageal cancer Neg Hx   . Stomach cancer Neg Hx   . Heart attack Father    the patient knows little about her father. Her mother died at at the age of 31 following a stroke. The patient had 2 brothers, 2 sisters. The only history of breast or ovarian cancer in the family is a first cousin on the mother's side diagnosed with breast cancer at age 59  GYNECOLOGIC HISTORY:  Patient's last menstrual period was 10/10/1996. Menarche age 35, first live birth age 37. The patient is GX P2. She status post simple hysterectomy without salpingo-oophorectomy. She did not take hormone replacement.  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 The patient's husband, Jori Moll "Ron" Donaldson works for [employer] in the pattern shop. He also works as a Dispensing optician. Daughter Brielle Moro lives in [place] and works in Programmer, applications. Daughter [name] Gwinner lives in [place] and works at a [employer]. 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    ADVANCED DIRECTIVES: Not in place   HEALTH MAINTENANCE: History  Substance Use Topics  . Smoking status: Current Every Day Smoker -- 0.50 packs/day for 39 years    Types: Cigarettes  . Smokeless tobacco: Never Used  . Alcohol Use: 4.2 oz/week    7 Cans of beer per week     Colonoscopy:  PAP:  Bone density:  Lipid panel:  Allergies  Allergen Reactions  . Sulfa  Antibiotics Other (See Comments)    Unknown reaction    Current Outpatient Prescriptions  Medication Sig Dispense Refill  . chlorthalidone (HYGROTON) 25 MG tablet Take 1 tablet (25 mg total) by mouth every other day. 30 tablet 3  . dexamethasone (DECADRON) 4 MG tablet Take 2 tablets (8 mg total) by mouth 2 (two) times daily. Start the day before Taxotere. Then again the day after chemo for 3 days. 30 tablet 1  . enalapril (VASOTEC) 20 MG tablet Take 1 tablet (20 mg total) by mouth at bedtime. 30 tablet 11  . fluticasone (FLONASE) 50 MCG/ACT nasal spray Place 1 spray into both nostrils as needed.    Marland Kitchen LORazepam (ATIVAN) 0.5 MG tablet Take 1 tablet (0.5 mg total) by mouth at bedtime. 30 tablet 0  . metoprolol succinate (TOPROL-XL) 50 MG 24 hr tablet Take 1 tablet (50 mg total) by mouth at bedtime. 30 tablet 11  .  ondansetron (ZOFRAN) 8 MG tablet Take 1 tablet (8 mg total) by mouth 2 (two) times daily. Start the day after chemo for 3 days. Then take as needed for nausea or vomiting. 30 tablet 1  . albuterol (PROVENTIL HFA;VENTOLIN HFA) 108 (90 BASE) MCG/ACT inhaler Inhale into the lungs every 6 (six) hours as needed for wheezing or shortness of breath.    . lidocaine-prilocaine (EMLA) cream Apply to affected area once (Patient not taking: Reported on 12/30/2014) 30 g 3  . oxyCODONE-acetaminophen (PERCOCET) 10-325 MG per tablet Take 1 tablet by mouth every 6 (six) hours as needed for pain. (Patient not taking: Reported on 01/13/2015) 10 tablet 0  . prochlorperazine (COMPAZINE) 10 MG tablet Take 1 tablet (10 mg total) by mouth every 6 (six) hours as needed (Nausea or vomiting). (Patient not taking: Reported on 12/30/2014) 30 tablet 1  . tobramycin-dexamethasone (TOBRADEX) ophthalmic solution Place 1 drop into both eyes every 6 (six) hours. (Patient not taking: Reported on 12/23/2014) 5 mL 1   No current facility-administered medications for this visit.    OBJECTIVE: Middle-aged African-American woman in  no acute distress Filed Vitals:   01/13/15 1316  BP: 120/69  Pulse: 86  Temp: 97.4 F (36.3 C)  Resp: 18     Body mass index is 40.25 kg/(m^2).    ECOG FS:0 - Asymptomatic  No rash or lesions to palms or back of hands. Hyperpigmentation at base of nailbeds. Sclerae unicteric, pupils equal and reactive Oropharynx clear and moist-- no thrush No cervical or supraclavicular adenopathy Lungs no rales or rhonchi Heart regular rate and rhythm Abd soft, nontender, positive bowel sounds MSK no focal spinal tenderness, no upper extremity lymphedema Neuro: nonfocal, well oriented, appropriate affect Breasts: deferred  LAB RESULTS:  CMP     Component Value Date/Time   NA 143 01/13/2015 1302   NA 137 09/08/2013 1120   K 4.7 01/13/2015 1302   K 3.6 09/08/2013 1120   CL 103 09/08/2013 1120   CO2 23 01/13/2015 1302   CO2 21 09/08/2013 1120   GLUCOSE 99 01/13/2015 1302   GLUCOSE 89 09/08/2013 1120   BUN 17.6 01/13/2015 1302   BUN 9 09/08/2013 1120   CREATININE 0.8 01/13/2015 1302   CREATININE 0.85 09/08/2013 1120   CREATININE 0.93 09/02/2013 1128   CALCIUM 9.5 01/13/2015 1302   CALCIUM 9.4 09/08/2013 1120   PROT 6.6 01/13/2015 1302   PROT 7.0 09/08/2013 1120   ALBUMIN 3.3* 01/13/2015 1302   ALBUMIN 3.4* 09/08/2013 1120   AST 11 01/13/2015 1302   AST 15 09/08/2013 1120   ALT 13 01/13/2015 1302   ALT 12 09/08/2013 1120   ALKPHOS 65 01/13/2015 1302   ALKPHOS 80 09/08/2013 1120   BILITOT 0.35 01/13/2015 1302   BILITOT 0.5 09/08/2013 1120   GFRNONAA 76* 09/08/2013 1120   GFRNONAA 70 09/02/2013 1128   GFRAA 88* 09/08/2013 1120   GFRAA 81 09/02/2013 1128    INo results found for: SPEP, UPEP  Lab Results  Component Value Date   WBC 8.4 01/13/2015   NEUTROABS 7.4* 01/13/2015   HGB 12.0 01/13/2015   HCT 34.8 01/13/2015   MCV 91.3 01/13/2015   PLT 282 01/13/2015      Chemistry      Component Value Date/Time   NA 143 01/13/2015 1302   NA 137 09/08/2013 1120   K 4.7  01/13/2015 1302   K 3.6 09/08/2013 1120   CL 103 09/08/2013 1120   CO2 23 01/13/2015 1302  CO2 21 09/08/2013 1120   BUN 17.6 01/13/2015 1302   BUN 9 09/08/2013 1120   CREATININE 0.8 01/13/2015 1302   CREATININE 0.85 09/08/2013 1120   CREATININE 0.93 09/02/2013 1128      Component Value Date/Time   CALCIUM 9.5 01/13/2015 1302   CALCIUM 9.4 09/08/2013 1120   ALKPHOS 65 01/13/2015 1302   ALKPHOS 80 09/08/2013 1120   AST 11 01/13/2015 1302   AST 15 09/08/2013 1120   ALT 13 01/13/2015 1302   ALT 12 09/08/2013 1120   BILITOT 0.35 01/13/2015 1302   BILITOT 0.5 09/08/2013 1120       No results found for: LABCA2  No components found for: LABCA125  No results for input(s): INR in the last 168 hours.  Urinalysis    Component Value Date/Time   COLORURINE YELLOW 09/08/2013 [place] 09/08/2013 1258   LABSPEC 1.019 09/08/2013 1258   PHURINE 8.0 09/08/2013 1258   GLUCOSEU NEGATIVE 09/08/2013 1258   HGBUR TRACE* 09/08/2013 [place] 09/08/2013 1258   KETONESUR 40* 09/08/2013 1258   PROTEINUR NEGATIVE 09/08/2013 1258   UROBILINOGEN 0.2 09/08/2013 1258   NITRITE NEGATIVE 09/08/2013 1258   LEUKOCYTESUR NEGATIVE 09/08/2013 1258    STUDIES: No results found.  ASSESSMENT: 59 y.o. [place], [place] woman status post right breast upper outer quadrant biopsy 11/12/2014 for a clinical T1c N0, stage IA invasive ductal carcinoma, triple negative, with an MIB-1 of 19%  (1) neoadjuvant chemotherapy will consist of cyclophosphamide and docetaxel 4 given every 21 days with Neulasta support, first dose 12/02/2014  (2) genetics testing pending  (3) surgery to be guided by genetics results, and to take place at the conclusion of chemotherapy  (4) radiation to follow surgery  (5) continuing tobacco abuse. The patient has been strongly advised to quit smoking  PLAN: Katie Jacobson is much improved today. The labs were reviewed in detail and were  entirely stable. She will proceed with cycle 3 of cyclophosphamide and docetaxel as planned today.   I have advised her to restart the pepcid, as it did a good job of controlling her heartburn.   Katie Jacobson will return in 1 week for labs and a nadir visit. She understands and agrees with this plan. She knows the goal of treatment in her case is cure. She has been encouraged to call with any issues that might arise before her next visit here.   Katie Panda, NP   01/13/2015 1:46 PM

## 2015-01-13 NOTE — Progress Notes (Signed)
Discharged at 1742, ambulatory in no distress.

## 2015-01-13 NOTE — Progress Notes (Signed)
On-pro device applied to abdomen at 1730.  Instructed to assess light on top of device.  Will hear beeping when it begins to inject dose.  Remove on 01-14-2015 at 10:15 pm if reads empty and solid glow with no leakage.  Save device to return to [facility] for credit if any malfunction.  Instructed to send in postage free card to company for container to discard of these devices.

## 2015-01-14 ENCOUNTER — Ambulatory Visit: Payer: BLUE CROSS/BLUE SHIELD

## 2015-01-20 ENCOUNTER — Ambulatory Visit (HOSPITAL_BASED_OUTPATIENT_CLINIC_OR_DEPARTMENT_OTHER): Payer: BLUE CROSS/BLUE SHIELD | Admitting: Nurse Practitioner

## 2015-01-20 ENCOUNTER — Telehealth: Payer: Self-pay | Admitting: Nurse Practitioner

## 2015-01-20 ENCOUNTER — Encounter: Payer: Self-pay | Admitting: Nurse Practitioner

## 2015-01-20 ENCOUNTER — Other Ambulatory Visit (HOSPITAL_BASED_OUTPATIENT_CLINIC_OR_DEPARTMENT_OTHER): Payer: BLUE CROSS/BLUE SHIELD

## 2015-01-20 VITALS — BP 98/67 | HR 93 | Temp 98.2°F | Resp 18 | Ht 61.0 in | Wt 209.5 lb

## 2015-01-20 DIAGNOSIS — C50411 Malignant neoplasm of upper-outer quadrant of right female breast: Secondary | ICD-10-CM

## 2015-01-20 DIAGNOSIS — Z720 Tobacco use: Secondary | ICD-10-CM | POA: Diagnosis not present

## 2015-01-20 DIAGNOSIS — B372 Candidiasis of skin and nail: Secondary | ICD-10-CM | POA: Diagnosis not present

## 2015-01-20 LAB — CBC WITH DIFFERENTIAL/PLATELET
BASO%: 0.6 % (ref 0.0–2.0)
Basophils Absolute: 0 10*3/uL (ref 0.0–0.1)
EOS%: 1.5 % (ref 0.0–7.0)
Eosinophils Absolute: 0.1 10*3/uL (ref 0.0–0.5)
HCT: 37.3 % (ref 34.8–46.6)
HGB: 12.7 g/dL (ref 11.6–15.9)
LYMPH#: 1.1 10*3/uL (ref 0.9–3.3)
LYMPH%: 33.4 % (ref 14.0–49.7)
MCH: 30.8 pg (ref 25.1–34.0)
MCHC: 34 g/dL (ref 31.5–36.0)
MCV: 90.3 fL (ref 79.5–101.0)
MONO#: 1 10*3/uL — ABNORMAL HIGH (ref 0.1–0.9)
MONO%: 31 % — ABNORMAL HIGH (ref 0.0–14.0)
NEUT#: 1.1 10*3/uL — ABNORMAL LOW (ref 1.5–6.5)
NEUT%: 33.5 % — AB (ref 38.4–76.8)
Platelets: 189 10*3/uL (ref 145–400)
RBC: 4.13 10*6/uL (ref 3.70–5.45)
RDW: 16.5 % — ABNORMAL HIGH (ref 11.2–14.5)
WBC: 3.4 10*3/uL — AB (ref 3.9–10.3)

## 2015-01-20 LAB — COMPREHENSIVE METABOLIC PANEL (CC13)
ALT: 14 U/L (ref 0–55)
AST: 10 U/L (ref 5–34)
Albumin: 3.3 g/dL — ABNORMAL LOW (ref 3.5–5.0)
Alkaline Phosphatase: 72 U/L (ref 40–150)
Anion Gap: 9 mEq/L (ref 3–11)
BILIRUBIN TOTAL: 0.47 mg/dL (ref 0.20–1.20)
BUN: 13.7 mg/dL (ref 7.0–26.0)
CHLORIDE: 103 meq/L (ref 98–109)
CO2: 27 mEq/L (ref 22–29)
Calcium: 9.1 mg/dL (ref 8.4–10.4)
Creatinine: 0.8 mg/dL (ref 0.6–1.1)
EGFR: 90 mL/min/{1.73_m2} — ABNORMAL LOW (ref >90)
GLUCOSE: 93 mg/dL (ref 70–140)
POTASSIUM: 4.5 meq/L (ref 3.5–5.1)
Sodium: 139 mEq/L (ref 136–145)
TOTAL PROTEIN: 6.2 g/dL — AB (ref 6.4–8.3)

## 2015-01-20 MED ORDER — MICONAZOLE NITRATE POWD: 1.0000 "application " | Freq: Two times a day (BID) | Status: DC

## 2015-01-20 NOTE — Progress Notes (Signed)
Katie Jacobson  Telephone:([phone]) [phone] Fax:(336) [phone]     ID: Katie Jacobson DOB: [birth year]  MR#: [id number]  PPI#:[id number]  Patient Care Team: Debbrah Alar, NP as PCP - General (Internal Medicine) Harle Battiest, MD (Obstetrics and Gynecology) Rolm Bookbinder, MD as Consulting Physician (General Surgery) Chauncey Cruel, MD as Consulting Physician (Oncology) Eppie Gibson, MD as Attending Physician (Radiation Oncology) Rockwell Germany, RN as Registered Nurse Mauro Kaufmann, RN as Registered Nurse Holley Bouche, NP as Nurse Practitioner (Nurse Practitioner) OTHER MD:  CHIEF COMPLAINT: Triple negative breast cancer  CURRENT TREATMENT: Neoadjuvant chemotherapy   BREAST CANCER HISTORY: From the original intake note:  The patient herself palpated a mass in her right breast there is she brought it to her gynecologist, Dr. Alan Ripper attention and he obtained screening mammography which suggested an abnormality in the right breast. On 10/24/2014 the patient underwent right diagnostic mammography and ultrasonography at the breast Center. The breast density was category B. There was an ill-defined mass in the upper outer quadrant of the right breast which was palpable on exam. By ultrasound this was irregular and hypoechoic measuring 1.2 cm. Evaluation of the right axilla was negative.  Biopsy of the mass in question showed an invasive ductal carcinoma, grade 2 or 3, triple negative, with an MIB-1 of 19%.  Katie Jacobson case was presented at the multidisciplinary breast cancer conference 11/19/2014, where was suggested she would benefit from genetics testing. Since this would mandate a significant delay in her final surgery, neoadjuvant chemotherapy was indicated.  The patient's subsequent history is as detailed below  INTERVAL HISTORY: Katie Jacobson returns today for follow-up of her breast cancer, accompanied by her husband. Today is day 1, cycle 3 of cyclophosphamide  and docetaxel, with neulasta given on day 2 for granulocyte support.  REVIEW OF SYSTEMS: This week Katie Jacobson at her family and has just felt on edge in general. She states she just needs some time to herself and perhaps feels smothered. Physically she is doing well. She continues to have some heartburn but forgot to continue with the pepcid she has had home. There has been no more rash to her hands or feet. She denies fevers, chills, nausea, vomiting, or change sin bowel or bladder habits. Her appetite is fair but she has taste changes that make this hard. She is not sleeping well even with lorazepam. New this week is a rash under her bilateral breasts and in the folds of her thighs that is pruritic. She denies any vaginal changes or irritation. A detailed review of systems is otherwise stable.  PAST MEDICAL HISTORY: Past Medical History  Diagnosis Date  . Heart palpitations   . Osteoporosis   . Essential hypertension 04/01/2011  . History of partial seizures 1970    never knew why  . Mitral valve prolapse 1990  . Personal history of kidney stones 05/2012  . Obesity (BMI 35.0-39.9 without comorbidity)   . Breast cancer of upper-outer quadrant of right female breast 11/14/2014  . Family history of breast cancer     PAST SURGICAL HISTORY: Past Surgical History  Procedure Laterality Date  . Appendectomy  1976  . Abdominal hysterectomy  1998    ovaries spared  . Tonsillectomy  1970s  . Knee arthroscopy  2006    left  knee  . Cystectomy      Pt reports removal of a "stone" from the right side of her throat as a child  .  Tubal ligation  1996  . Foot sx      X2  . Doppler echocardiography  03/27/1989    suggestive eidence of mitral valve proplapse miminal ; trace mitral regurgitation  . Nm myoview ltd  07/01/1996    EF 62%  . Salivary stone removal  1972    right neck  . Colonoscopy    . Portacath placement N/A 11/27/2014    Procedure: INSERTION PORT-A-CATH;   Surgeon: Rolm Bookbinder, MD;  Location: [facility];  Service: General;  Laterality: N/A;    FAMILY HISTORY Family History  Problem Relation Age of Onset  . Heart disease Mother   . Stroke Mother   . Hypertension Mother   . Diabetes Mother   . Diabetes Sister   . Kidney disease Brother   . Mental retardation Brother   . Breast cancer Cousin     deceased 60  . Diabetes Sister   . Colon cancer Neg Hx   . Esophageal cancer Neg Hx   . Stomach cancer Neg Hx   . Heart attack Father    the patient knows little about her father. Her mother died at at the age of 63 following a stroke. The patient had 2 brothers, 2 sisters. The only history of breast or ovarian cancer in the family is a first cousin on the mother's side diagnosed with breast cancer at age 2  GYNECOLOGIC HISTORY:  Patient's last menstrual period was 10/10/1996. Menarche age 65, first live birth age 41. The patient is GX P2. She status post simple hysterectomy without salpingo-oophorectomy. She did not take hormone replacement.  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 The patient's husband, Katie Jacobson works for [employer] in the pattern shop. He also works as a Dispensing optician. Daughter Katie Jacobson lives in [place] and works in Programmer, applications. Daughter Katie Jacobson lives in [place] and works at a [employer]. 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    ADVANCED DIRECTIVES: Not in place   HEALTH MAINTENANCE: History  Substance Use Topics  . Smoking status: Current Every Day Smoker -- 0.50 packs/day for 39 years    Types: Cigarettes  . Smokeless tobacco: Never Used  . Alcohol Use: 4.2 oz/week    7 Cans of beer per week     Colonoscopy:  PAP:  Bone density:  Lipid panel:  Allergies  Allergen Reactions  . Sulfa Antibiotics Other (See Comments)    Unknown reaction    Current Outpatient Prescriptions  Medication  Sig Dispense Refill  . chlorthalidone (HYGROTON) 25 MG tablet Take 1 tablet (25 mg total) by mouth every other day. 30 tablet 3  . enalapril (VASOTEC) 20 MG tablet Take 1 tablet (20 mg total) by mouth at bedtime. 30 tablet 11  . lidocaine-prilocaine (EMLA) cream Apply to affected area once 30 g 3  . LORazepam (ATIVAN) 0.5 MG tablet Take 1 tablet (0.5 mg total) by mouth at bedtime. 30 tablet 0  . metoprolol succinate (TOPROL-XL) 50 MG 24 hr tablet Take 1 tablet (50 mg total) by mouth at bedtime. 30 tablet 11  . ondansetron (ZOFRAN) 8 MG tablet Take 1 tablet (8 mg total) by mouth 2 (two) times daily. Start the day after chemo for 3 days. Then take as needed for nausea or vomiting. 30 tablet 1  . oxyCODONE-acetaminophen (PERCOCET) 10-325 MG per tablet Take 1 tablet by mouth every 6 (six) hours  as needed for pain. 10 tablet 0  . albuterol (PROVENTIL HFA;VENTOLIN HFA) 108 (90 BASE) MCG/ACT inhaler Inhale into the lungs every 6 (six) hours as needed for wheezing or shortness of breath.    . dexamethasone (DECADRON) 4 MG tablet Take 2 tablets (8 mg total) by mouth 2 (two) times daily. Start the day before Taxotere. Then again the day after chemo for 3 days. (Patient not taking: Reported on 01/20/2015) 30 tablet 1  . fluticasone (FLONASE) 50 MCG/ACT nasal spray Place 1 spray into both nostrils as needed.    . prochlorperazine (COMPAZINE) 10 MG tablet Take 1 tablet (10 mg total) by mouth every 6 (six) hours as needed (Nausea or vomiting). (Patient not taking: Reported on 12/30/2014) 30 tablet 1  . tobramycin-dexamethasone (TOBRADEX) ophthalmic solution Place 1 drop into both eyes every 6 (six) hours. (Patient not taking: Reported on 12/23/2014) 5 mL 1   No current facility-administered medications for this visit.    OBJECTIVE: Middle-aged African-American woman in no acute distress Filed Vitals:   01/20/15 1146  BP: 98/67  Pulse: 93  Temp: 98.2 F (36.8 C)  Resp: 18     Body mass index is 39.61  kg/(m^2).    ECOG FS:0 - Asymptomatic  Skin: erythematous areas under bilateral breasts with central clearing. Likely fungal infection HEENT: sclerae anicteric, conjunctivae pink, oropharynx clear. No thrush or mucositis.  Lymph Nodes: No cervical or supraclavicular lymphadenopathy  Lungs: clear to auscultation bilaterally, no rales, wheezes, or rhonci  Heart: regular rate and rhythm  Abdomen: round, soft, non tender, positive bowel sounds  Musculoskeletal: No focal spinal tenderness, no peripheral edema  Neuro: non focal, well oriented, positive affect  Breast: deferred  LAB RESULTS:  CMP     Component Value Date/Time   NA 139 01/20/2015 1132   NA 137 09/08/2013 1120   K 4.5 01/20/2015 1132   K 3.6 09/08/2013 1120   CL 103 09/08/2013 1120   CO2 27 01/20/2015 1132   CO2 21 09/08/2013 1120   GLUCOSE 93 01/20/2015 1132   GLUCOSE 89 09/08/2013 1120   BUN 13.7 01/20/2015 1132   BUN 9 09/08/2013 1120   CREATININE 0.8 01/20/2015 1132   CREATININE 0.85 09/08/2013 1120   CREATININE 0.93 09/02/2013 1128   CALCIUM 9.1 01/20/2015 1132   CALCIUM 9.4 09/08/2013 1120   PROT 6.2* 01/20/2015 1132   PROT 7.0 09/08/2013 1120   ALBUMIN 3.3* 01/20/2015 1132   ALBUMIN 3.4* 09/08/2013 1120   AST 10 01/20/2015 1132   AST 15 09/08/2013 1120   ALT 14 01/20/2015 1132   ALT 12 09/08/2013 1120   ALKPHOS 72 01/20/2015 1132   ALKPHOS 80 09/08/2013 1120   BILITOT 0.47 01/20/2015 1132   BILITOT 0.5 09/08/2013 1120   GFRNONAA 76* 09/08/2013 1120   GFRNONAA 70 09/02/2013 1128   GFRAA 88* 09/08/2013 1120   GFRAA 81 09/02/2013 1128    INo results found for: SPEP, UPEP  Lab Results  Component Value Date   WBC 3.4* 01/20/2015   NEUTROABS 1.1* 01/20/2015   HGB 12.7 01/20/2015   HCT 37.3 01/20/2015   MCV 90.3 01/20/2015   PLT 189 01/20/2015      Chemistry      Component Value Date/Time   NA 139 01/20/2015 1132   NA 137 09/08/2013 1120   K 4.5 01/20/2015 1132   K 3.6 09/08/2013 1120   CL  103 09/08/2013 1120   CO2 27 01/20/2015 1132   CO2 21 09/08/2013 1120  BUN 13.7 01/20/2015 1132   BUN 9 09/08/2013 1120   CREATININE 0.8 01/20/2015 1132   CREATININE 0.85 09/08/2013 1120   CREATININE 0.93 09/02/2013 1128      Component Value Date/Time   CALCIUM 9.1 01/20/2015 1132   CALCIUM 9.4 09/08/2013 1120   ALKPHOS 72 01/20/2015 1132   ALKPHOS 80 09/08/2013 1120   AST 10 01/20/2015 1132   AST 15 09/08/2013 1120   ALT 14 01/20/2015 1132   ALT 12 09/08/2013 1120   BILITOT 0.47 01/20/2015 1132   BILITOT 0.5 09/08/2013 1120       No results found for: LABCA2  No components found for: LABCA125  No results for input(s): INR in the last 168 hours.  Urinalysis    Component Value Date/Time   COLORURINE YELLOW 09/08/2013 [place] 09/08/2013 1258   LABSPEC 1.019 09/08/2013 1258   PHURINE 8.0 09/08/2013 1258   GLUCOSEU NEGATIVE 09/08/2013 1258   HGBUR TRACE* 09/08/2013 [place] 09/08/2013 1258   KETONESUR 40* 09/08/2013 1258   PROTEINUR NEGATIVE 09/08/2013 1258   UROBILINOGEN 0.2 09/08/2013 1258   NITRITE NEGATIVE 09/08/2013 1258   LEUKOCYTESUR NEGATIVE 09/08/2013 1258    STUDIES: No results found.  ASSESSMENT: 59 y.o. [place], [place] woman status post right breast upper outer quadrant biopsy 11/12/2014 for a clinical T1c N0, stage IA invasive ductal carcinoma, triple negative, with an MIB-1 of 19%  (1) neoadjuvant chemotherapy will consist of cyclophosphamide and docetaxel 4 given every 21 days with Neulasta support, first dose 12/02/2014  (2) genetics testing pending  (3) surgery to be guided by genetics results, and to take place at the conclusion of chemotherapy  (4) radiation to follow surgery  (5) continuing tobacco abuse. The patient has been strongly advised to quit smoking  PLAN: Besides her mood, Damiyah is doing well today. The labs were reviewed in detail and were relatively stable. She is mildly  neutropenic with an ANC of 1.1 today, but she tends to recover well.   I have sent in a prescription for miconazole powder to apply to the affected skin folds for a likely yeast infection. At this time she does not believe the symptoms are vaginal, and if this develops she will try OTC vaginal suppositories.   Shylo will return in 2 weeks for her 4th and final cycle of cyclophosphamide and docetaxel. Following this treatment she will have a repeat breast MRI and I have placed orders for this. She will also meet with Dr. Donne Hazel to discuss surgery. She understands and agrees with this plan. She knows the goal of treatment in her case is cure. She has been encouraged to call with any issues that might arise before her next visit here.  Laurie Panda, NP   01/20/2015 1:44 PM

## 2015-01-20 NOTE — Telephone Encounter (Signed)
Called and left a message with dr Donne Hazel appointment for 02/17/15 4:20

## 2015-01-22 ENCOUNTER — Telehealth: Payer: Self-pay | Admitting: *Deleted

## 2015-01-22 NOTE — Telephone Encounter (Signed)
Called pt to inform her that I had called Rx for Miconazole powder to her pharmacy and she can pick up today. Left message to pt's VM. Message to be forwarded to [facility].

## 2015-02-02 ENCOUNTER — Other Ambulatory Visit: Payer: Self-pay | Admitting: *Deleted

## 2015-02-02 ENCOUNTER — Telehealth: Payer: Self-pay | Admitting: Oncology

## 2015-02-02 NOTE — Telephone Encounter (Signed)
pt cld left vm wanting to know time & date of appt-left pt a message and adv time & date

## 2015-02-03 ENCOUNTER — Ambulatory Visit (HOSPITAL_BASED_OUTPATIENT_CLINIC_OR_DEPARTMENT_OTHER): Payer: BLUE CROSS/BLUE SHIELD

## 2015-02-03 ENCOUNTER — Ambulatory Visit (HOSPITAL_BASED_OUTPATIENT_CLINIC_OR_DEPARTMENT_OTHER): Payer: BLUE CROSS/BLUE SHIELD | Admitting: Oncology

## 2015-02-03 ENCOUNTER — Other Ambulatory Visit (HOSPITAL_BASED_OUTPATIENT_CLINIC_OR_DEPARTMENT_OTHER): Payer: BLUE CROSS/BLUE SHIELD

## 2015-02-03 ENCOUNTER — Other Ambulatory Visit: Payer: BLUE CROSS/BLUE SHIELD

## 2015-02-03 ENCOUNTER — Telehealth: Payer: Self-pay | Admitting: Oncology

## 2015-02-03 ENCOUNTER — Encounter: Payer: Self-pay | Admitting: *Deleted

## 2015-02-03 VITALS — BP 116/62 | HR 90 | Temp 98.5°F | Resp 18 | Ht 61.0 in | Wt 216.3 lb

## 2015-02-03 DIAGNOSIS — Z171 Estrogen receptor negative status [ER-]: Secondary | ICD-10-CM | POA: Diagnosis not present

## 2015-02-03 DIAGNOSIS — Z5189 Encounter for other specified aftercare: Secondary | ICD-10-CM

## 2015-02-03 DIAGNOSIS — I10 Essential (primary) hypertension: Secondary | ICD-10-CM

## 2015-02-03 DIAGNOSIS — Z5111 Encounter for antineoplastic chemotherapy: Secondary | ICD-10-CM

## 2015-02-03 DIAGNOSIS — Z720 Tobacco use: Secondary | ICD-10-CM

## 2015-02-03 DIAGNOSIS — C50411 Malignant neoplasm of upper-outer quadrant of right female breast: Secondary | ICD-10-CM | POA: Diagnosis not present

## 2015-02-03 DIAGNOSIS — Z8669 Personal history of other diseases of the nervous system and sense organs: Secondary | ICD-10-CM

## 2015-02-03 LAB — CBC WITH DIFFERENTIAL/PLATELET
BASO%: 0 % (ref 0.0–2.0)
Basophils Absolute: 0 10*3/uL (ref 0.0–0.1)
EOS%: 0 % (ref 0.0–7.0)
Eosinophils Absolute: 0 10*3/uL (ref 0.0–0.5)
HEMATOCRIT: 32.8 % — AB (ref 34.8–46.6)
HGB: 11 g/dL — ABNORMAL LOW (ref 11.6–15.9)
LYMPH#: 0.7 10*3/uL — AB (ref 0.9–3.3)
LYMPH%: 7.4 % — AB (ref 14.0–49.7)
MCH: 31 pg (ref 25.1–34.0)
MCHC: 33.5 g/dL (ref 31.5–36.0)
MCV: 92.4 fL (ref 79.5–101.0)
MONO#: 0.1 10*3/uL (ref 0.1–0.9)
MONO%: 1 % (ref 0.0–14.0)
NEUT%: 91.6 % — ABNORMAL HIGH (ref 38.4–76.8)
NEUTROS ABS: 8.3 10*3/uL — AB (ref 1.5–6.5)
PLATELETS: 268 10*3/uL (ref 145–400)
RBC: 3.55 10*6/uL — ABNORMAL LOW (ref 3.70–5.45)
RDW: 18.4 % — ABNORMAL HIGH (ref 11.2–14.5)
WBC: 9.1 10*3/uL (ref 3.9–10.3)

## 2015-02-03 LAB — COMPREHENSIVE METABOLIC PANEL (CC13)
ALBUMIN: 3.2 g/dL — AB (ref 3.5–5.0)
ALT: 13 U/L (ref 0–55)
ANION GAP: 13 meq/L — AB (ref 3–11)
AST: 11 U/L (ref 5–34)
Alkaline Phosphatase: 62 U/L (ref 40–150)
BUN: 16.4 mg/dL (ref 7.0–26.0)
CO2: 20 mEq/L — ABNORMAL LOW (ref 22–29)
CREATININE: 0.8 mg/dL (ref 0.6–1.1)
Calcium: 9.3 mg/dL (ref 8.4–10.4)
Chloride: 110 mEq/L — ABNORMAL HIGH (ref 98–109)
EGFR: >90 mL/min/{1.73_m2} (ref >90)
Glucose: 124 mg/dl (ref 70–140)
Potassium: 4.1 mEq/L (ref 3.5–5.1)
Sodium: 143 mEq/L (ref 136–145)
Total Bilirubin: 0.23 mg/dL (ref 0.20–1.20)
Total Protein: 6.2 g/dL — ABNORMAL LOW (ref 6.4–8.3)

## 2015-02-03 MED ORDER — SODIUM CHLORIDE 0.9 % IV SOLN
600.0000 mg/m2 | Freq: Once | INTRAVENOUS | Status: AC
  Administered 2015-02-03: 1240 mg via INTRAVENOUS
  Filled 2015-02-03: qty 62

## 2015-02-03 MED ORDER — DOCETAXEL CHEMO INJECTION 160 MG/16ML
75.0000 mg/m2 | Freq: Once | INTRAVENOUS | Status: AC
  Administered 2015-02-03: 150 mg via INTRAVENOUS
  Filled 2015-02-03: qty 15

## 2015-02-03 MED ORDER — SODIUM CHLORIDE 0.9 % IV SOLN
Freq: Once | INTRAVENOUS | Status: AC
  Administered 2015-02-03: 11:00:00 via INTRAVENOUS
  Filled 2015-02-03: qty 8

## 2015-02-03 MED ORDER — PEGFILGRASTIM 6 MG/0.6ML ~~LOC~~ PSKT
6.0000 mg | PREFILLED_SYRINGE | Freq: Once | SUBCUTANEOUS | Status: AC
  Administered 2015-02-03: 6 mg via SUBCUTANEOUS
  Filled 2015-02-03: qty 0.6

## 2015-02-03 MED ORDER — HEPARIN SOD (PORK) LOCK FLUSH 100 UNIT/ML IV SOLN
500.0000 [IU] | Freq: Once | INTRAVENOUS | Status: AC | PRN
  Administered 2015-02-03: 500 [IU]
  Filled 2015-02-03: qty 5

## 2015-02-03 MED ORDER — SODIUM CHLORIDE 0.9 % IV SOLN
Freq: Once | INTRAVENOUS | Status: AC
  Administered 2015-02-03: 10:00:00 via INTRAVENOUS

## 2015-02-03 MED ORDER — SODIUM CHLORIDE 0.9 % IJ SOLN
10.0000 mL | INTRAMUSCULAR | Status: DC | PRN
  Administered 2015-02-03: 10 mL
  Filled 2015-02-03: qty 10

## 2015-02-03 NOTE — Telephone Encounter (Signed)
June appointments made and avs will be printed for patient in chemo

## 2015-02-03 NOTE — Progress Notes (Signed)
Spoke with patient today at her last chemo.  She is doing well and so glad to have this part of her journey completed.  Observed her ring the bell and she was so excited.  Will follow back up with her at her next visit here on 5/3 after her MRI.

## 2015-02-03 NOTE — Progress Notes (Signed)
Katie Jacobson  [phone] [phone]) [phone]     ID: Katie Jacobson DOB: 1956/07/19  MR#: [id number]  SWN#:[id number]  Patient Care Team: Katie Alar, NP as PCP - General (Internal Medicine) Katie Battiest, MD (Obstetrics and Gynecology) Katie Bookbinder, MD as Consulting Physician (General Surgery) Katie Cruel, MD as Consulting Physician (Oncology) Katie Gibson, MD as Attending Physician (Radiation Oncology) Katie Germany, RN as Registered Nurse Katie Kaufmann, RN as Registered Nurse Katie Bouche, NP as Nurse Practitioner (Nurse Practitioner) OTHER MD:  CHIEF COMPLAINT: Triple negative breast cancer  CURRENT TREATMENT: Neoadjuvant chemotherapy   BREAST CANCER HISTORY: From the original intake note:  The patient herself palpated a mass in her right breast there is she brought it to her gynecologist, Dr. Alan Ripper attention and he obtained screening mammography which suggested an abnormality in the right breast. On 10/24/2014 the patient underwent right diagnostic mammography and ultrasonography at the breast Center. The breast density was category B. There was an ill-defined mass in the upper outer quadrant of the right breast which was palpable on exam. By ultrasound this was irregular and hypoechoic measuring 1.2 cm. Evaluation of the right axilla was negative.  Biopsy of the mass in question showed an invasive ductal carcinoma, grade 2 or 3, triple negative, with an MIB-1 of 19%.  [name] case was presented at the multidisciplinary breast cancer conference 11/19/2014, where was suggested she would benefit from genetics testing. Since this would mandate a significant delay in her final surgery, neoadjuvant chemotherapy was indicated.  The patient's subsequent history is as detailed below  INTERVAL HISTORY: Katie Jacobson returns today for follow-up of her breast cancer, accompanied by her husband. Today is day 1, cycle 4 of 4 planned cycles  of cyclophosphamide and docetaxel, with neulasta given on day 2 for granulocyte support.  REVIEW OF SYSTEMS: Katie Jacobson has done remarkably well with chemotherapy. It didn't help that her brother died suddenly around the time of her third chemotherapy cycle. Even though he had been on dialysis, that was unexpected. She had significant problems chiefly with the Neulasta, but learned to treat that with the Claritin and Tylenol. She never had problems with nausea or vomiting. She never had blurred vision. She has had some hyperpigmentation. She has felt tired. There have not been intercurrent fevers or infections other than mild candidal rash under her left breast. Overall a detailed review of systems today was noncontributory except as noted  PAST MEDICAL HISTORY: Past Medical History  Diagnosis Date  . Heart palpitations   . Osteoporosis   . Essential hypertension 04/01/2011  . History of partial seizures 1970    never knew why  . Mitral valve prolapse 1990  . Personal history of kidney stones 05/2012  . Obesity (BMI 35.0-39.9 without comorbidity)   . Breast cancer of upper-outer quadrant of right female breast 11/14/2014  . Family history of breast cancer     PAST SURGICAL HISTORY: Past Surgical History  Procedure Laterality Date  . Appendectomy  1976  . Abdominal hysterectomy  1998    ovaries spared  . Tonsillectomy  1970s  . Knee arthroscopy  2006    left  knee  . Cystectomy      Pt reports removal of a "stone" from the right side of her throat as a child  . Tubal ligation  1996  . Foot sx      X2  . Doppler echocardiography  03/27/1989    suggestive eidence of mitral valve proplapse  miminal ; trace mitral regurgitation  . Nm myoview ltd  07/01/1996    EF 62%  . Salivary stone removal  1972    right neck  . Colonoscopy    . Portacath placement N/A 11/27/2014    Procedure: INSERTION PORT-A-CATH;  Surgeon: Katie Bookbinder, MD;  Location: [place];  Service:  General;  Laterality: N/A;    FAMILY HISTORY Family History  Problem Relation Age of Onset  . Heart disease Mother   . Stroke Mother   . Hypertension Mother   . Diabetes Mother   . Diabetes Sister   . Kidney disease Brother   . Mental retardation Brother   . Breast cancer Cousin     deceased 61  . Diabetes Sister   . Colon cancer Neg Hx   . Esophageal cancer Neg Hx   . Stomach cancer Neg Hx   . Heart attack Father    the patient knows little about her father. Her mother died at at the age of 75 following a stroke. The patient had 2 brothers, 2 sisters. The only history of breast or ovarian cancer in the family is a first cousin on the mother's side diagnosed with breast cancer at age 80  GYNECOLOGIC HISTORY:  Patient's last menstrual period was 10/10/1996. Menarche age 62, first live birth age 23. The patient is GX P2. She status post simple hysterectomy without salpingo-oophorectomy. She did not take hormone replacement.  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 The patient's husband, Jori Moll "Ron" Crutcher works for [employer] in the pattern shop. He also works as a Dispensing optician. Daughter Shernell Saldierna lives in [place] and works in Programmer, applications. Daughter [name] Sundberg lives in [place] and works at a [employer]. 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    ADVANCED DIRECTIVES: Not in place   HEALTH MAINTENANCE: History  Substance Use Topics  . Smoking status: Current Every Day Smoker -- 0.50 packs/day for 39 years    Types: Cigarettes  . Smokeless tobacco: Never Used  . Alcohol Use: 4.2 oz/week    7 Cans of beer per week     Colonoscopy:  PAP:  Bone density:  Lipid panel:  Allergies  Allergen Reactions  . Sulfa Antibiotics Other (See Comments)    Unknown reaction    Current Outpatient Prescriptions  Medication Sig Dispense Refill  . albuterol (PROVENTIL HFA;VENTOLIN HFA) 108 (90 BASE)  MCG/ACT inhaler Inhale into the lungs every 6 (six) hours as needed for wheezing or shortness of breath.    . chlorthalidone (HYGROTON) 25 MG tablet Take 1 tablet (25 mg total) by mouth every other day. 30 tablet 3  . dexamethasone (DECADRON) 4 MG tablet Take 2 tablets (8 mg total) by mouth 2 (two) times daily. Start the day before Taxotere. Then again the day after chemo for 3 days. (Patient not taking: Reported on 01/20/2015) 30 tablet 1  . enalapril (VASOTEC) 20 MG tablet Take 1 tablet (20 mg total) by mouth at bedtime. 30 tablet 11  . fluticasone (FLONASE) 50 MCG/ACT nasal spray Place 1 spray into both nostrils as needed.    . lidocaine-prilocaine (EMLA) cream Apply to affected area once 30 g 3  . LORazepam (ATIVAN) 0.5 MG tablet Take 1 tablet (0.5 mg total) by mouth at bedtime. 30 tablet 0  . metoprolol succinate (TOPROL-XL) 50 MG 24 hr tablet Take 1 tablet (50 mg total) by mouth  at bedtime. 30 tablet 11  . miconazole nitrate (MICATIN) POWD Apply 1 application topically 2 (two) times daily. Apply to affected areas 25 g 0  . ondansetron (ZOFRAN) 8 MG tablet Take 1 tablet (8 mg total) by mouth 2 (two) times daily. Start the day after chemo for 3 days. Then take as needed for nausea or vomiting. 30 tablet 1  . oxyCODONE-acetaminophen (PERCOCET) 10-325 MG per tablet Take 1 tablet by mouth every 6 (six) hours as needed for pain. 10 tablet 0  . prochlorperazine (COMPAZINE) 10 MG tablet Take 1 tablet (10 mg total) by mouth every 6 (six) hours as needed (Nausea or vomiting). (Patient not taking: Reported on 12/30/2014) 30 tablet 1  . tobramycin-dexamethasone (TOBRADEX) ophthalmic solution Place 1 drop into both eyes every 6 (six) hours. (Patient not taking: Reported on 12/23/2014) 5 mL 1   No current facility-administered medications for this visit.    OBJECTIVE: Middle-aged African-American woman who appears stated age 59 Vitals:   02/03/15 0952  BP: 116/62  Pulse: 90  Temp: 98.5 F (36.9 C)    Resp: 18     Body mass index is 40.89 kg/(m^2).    ECOG FS:1 - Symptomatic but completely ambulatory  Sclerae unicteric, pupils round and equal Oropharynx clear, no thrush or other lesions No cervical or supraclavicular adenopathy Lungs no rales or rhonchi Heart regular rate and rhythm Abd soft, obese, nontender, positive bowel sounds MSK no focal spinal tenderness, no upper extremity lymphedema Neuro: nonfocal, well oriented, positive affect Breasts: I do not palpate any mass in the right breast and specifically in the right upper quadrant. The right axilla is benign. The left breast is unremarkable    LAB RESULTS:  CMP     Component Value Date/Time   NA 143 02/03/2015 0924   NA 137 09/08/2013 1120   K 4.1 02/03/2015 0924   K 3.6 09/08/2013 1120   CL 103 09/08/2013 1120   CO2 20* 02/03/2015 0924   CO2 21 09/08/2013 1120   GLUCOSE 124 02/03/2015 0924   GLUCOSE 89 09/08/2013 1120   BUN 16.4 02/03/2015 0924   BUN 9 09/08/2013 1120   CREATININE 0.8 02/03/2015 0924   CREATININE 0.85 09/08/2013 1120   CREATININE 0.93 09/02/2013 1128   CALCIUM 9.3 02/03/2015 0924   CALCIUM 9.4 09/08/2013 1120   PROT 6.2* 02/03/2015 0924   PROT 7.0 09/08/2013 1120   ALBUMIN 3.2* 02/03/2015 0924   ALBUMIN 3.4* 09/08/2013 1120   AST 11 02/03/2015 0924   AST 15 09/08/2013 1120   ALT 13 02/03/2015 0924   ALT 12 09/08/2013 1120   ALKPHOS 62 02/03/2015 0924   ALKPHOS 80 09/08/2013 1120   BILITOT 0.23 02/03/2015 0924   BILITOT 0.5 09/08/2013 1120   GFRNONAA 76* 09/08/2013 1120   GFRNONAA 70 09/02/2013 1128   GFRAA 88* 09/08/2013 1120   GFRAA 81 09/02/2013 1128    INo results found for: SPEP, UPEP  Lab Results  Component Value Date   WBC 9.1 02/03/2015   NEUTROABS 8.3* 02/03/2015   HGB 11.0* 02/03/2015   HCT 32.8* 02/03/2015   MCV 92.4 02/03/2015   PLT 268 02/03/2015      Chemistry      Component Value Date/Time   NA 143 02/03/2015 0924   NA 137 09/08/2013 1120   K 4.1  02/03/2015 0924   K 3.6 09/08/2013 1120   CL 103 09/08/2013 1120   CO2 20* 02/03/2015 0924   CO2 21 09/08/2013 1120   BUN  16.4 02/03/2015 0924   BUN 9 09/08/2013 1120   CREATININE 0.8 02/03/2015 0924   CREATININE 0.85 09/08/2013 1120   CREATININE 0.93 09/02/2013 1128      Component Value Date/Time   CALCIUM 9.3 02/03/2015 0924   CALCIUM 9.4 09/08/2013 1120   ALKPHOS 62 02/03/2015 0924   ALKPHOS 80 09/08/2013 1120   AST 11 02/03/2015 0924   AST 15 09/08/2013 1120   ALT 13 02/03/2015 0924   ALT 12 09/08/2013 1120   BILITOT 0.23 02/03/2015 0924   BILITOT 0.5 09/08/2013 1120       No results found for: LABCA2  No components found for: LABCA125  No results for input(s): INR in the last 168 hours.  Urinalysis    Component Value Date/Time   COLORURINE YELLOW 09/08/2013 [place] 09/08/2013 1258   LABSPEC 1.019 09/08/2013 1258   PHURINE 8.0 09/08/2013 1258   GLUCOSEU NEGATIVE 09/08/2013 1258   HGBUR TRACE* 09/08/2013 [place] 09/08/2013 1258   KETONESUR 40* 09/08/2013 1258   PROTEINUR NEGATIVE 09/08/2013 1258   UROBILINOGEN 0.2 09/08/2013 1258   NITRITE NEGATIVE 09/08/2013 1258   LEUKOCYTESUR NEGATIVE 09/08/2013 1258    STUDIES: No results found.  ASSESSMENT: 59 y.o. [place], [place] woman status post right breast upper outer quadrant biopsy 11/12/2014 for a clinical T1c N0, stage IA invasive ductal carcinoma, triple negative, with an MIB-1 of 19%  (1) neoadjuvant chemotherapy will consist of cyclophosphamide and docetaxel 4 given every 21 days with Neulasta support, first dose 12/02/2014, completed 02/03/2015  (2) genetics testing through the [facility] gene panel at [facility] showed no deleterious mutations in ATM, BARD1, BRCA1, BRCA2, BRIP1, CDH1, CHEK2, MRE11A, MUTYH, NBN, NF1, PALB2, PTEN, RAD50, RAD51C, RAD51D, oe TP53.  (3) surgery to be guided by genetics results, and to take place at the conclusion of  chemotherapy  (4) radiation to follow surgery  (5) continuing tobacco abuse. The patient has been strongly advised to quit smoking  PLAN: Katie Jacobson is completing her chemotherapy today. It is very favorable that I do not palpate any mass in the right upper quadrant of the right breast. Of course she will have her MRI early next week and that will tell us more. We will see her next week to review that test and also to check her lab work at [facility].  She will see Dr. Donne Hazel May 10. He will set her up for surgery at his discretion and he can remove the port at the same time. She will then see me and Dr. Isidore Moos early June to review the final pathology and to get started on her radiation.  Katie Jacobson has a good understanding of this plan. She agrees with it. She knows the goal of treatment in her case is cure. She will call with any problems that may develop before her next visit here. Katie Cruel, MD   02/03/2015 10:02 AM

## 2015-02-03 NOTE — Patient Instructions (Signed)
[facility] Discharge Instructions for Patients Receiving Chemotherapy  Today you received the following chemotherapy agents:  Taxotere & Cytoxan  To help prevent nausea and vomiting after your treatment, we encourage you to take your nausea medication.   If you develop nausea and vomiting that is not controlled by your nausea medication, call the clinic.   BELOW ARE SYMPTOMS THAT SHOULD BE REPORTED IMMEDIATELY:  *FEVER GREATER THAN 100.5 F  *CHILLS WITH OR WITHOUT FEVER  NAUSEA AND VOMITING THAT IS NOT CONTROLLED WITH YOUR NAUSEA MEDICATION  *UNUSUAL SHORTNESS OF BREATH  *UNUSUAL BRUISING OR BLEEDING  TENDERNESS IN MOUTH AND THROAT WITH OR WITHOUT PRESENCE OF ULCERS  *URINARY PROBLEMS  *BOWEL PROBLEMS  UNUSUAL RASH Items with * indicate a potential emergency and should be followed up as soon as possible.  Feel free to call the clinic you have any questions or concerns. The clinic phone number is [phone].  Please show the [facility] at check-in to the Emergency Department and triage nurse.

## 2015-02-04 ENCOUNTER — Ambulatory Visit: Payer: BLUE CROSS/BLUE SHIELD

## 2015-02-08 HISTORY — PX: BREAST LUMPECTOMY: SHX2

## 2015-02-09 ENCOUNTER — Ambulatory Visit (HOSPITAL_COMMUNITY): Payer: BLUE CROSS/BLUE SHIELD

## 2015-02-09 ENCOUNTER — Ambulatory Visit (HOSPITAL_COMMUNITY)
Admission: RE | Admit: 2015-02-09 | Discharge: 2015-02-09 | Disposition: A | Discharge status: Home / Self Care | Payer: BLUE CROSS/BLUE SHIELD | Source: Ambulatory Visit | Attending: Nurse Practitioner | Admitting: Nurse Practitioner

## 2015-02-09 DIAGNOSIS — C50411 Malignant neoplasm of upper-outer quadrant of right female breast: Secondary | ICD-10-CM | POA: Insufficient documentation

## 2015-02-09 MED ORDER — GADOBENATE DIMEGLUMINE 529 MG/ML IV SOLN
20.0000 mL | Freq: Once | INTRAVENOUS | Status: AC | PRN
  Administered 2015-02-09: 20 mL via INTRAVENOUS

## 2015-02-10 ENCOUNTER — Other Ambulatory Visit (HOSPITAL_BASED_OUTPATIENT_CLINIC_OR_DEPARTMENT_OTHER): Payer: BLUE CROSS/BLUE SHIELD

## 2015-02-10 ENCOUNTER — Encounter: Payer: Self-pay | Admitting: Nurse Practitioner

## 2015-02-10 ENCOUNTER — Ambulatory Visit (HOSPITAL_BASED_OUTPATIENT_CLINIC_OR_DEPARTMENT_OTHER): Payer: BLUE CROSS/BLUE SHIELD | Admitting: Nurse Practitioner

## 2015-02-10 VITALS — BP 118/73 | HR 99 | Temp 98.2°F | Resp 18 | Ht 61.0 in | Wt 209.9 lb

## 2015-02-10 DIAGNOSIS — Z720 Tobacco use: Secondary | ICD-10-CM | POA: Diagnosis not present

## 2015-02-10 DIAGNOSIS — C50411 Malignant neoplasm of upper-outer quadrant of right female breast: Secondary | ICD-10-CM

## 2015-02-10 DIAGNOSIS — B372 Candidiasis of skin and nail: Secondary | ICD-10-CM

## 2015-02-10 DIAGNOSIS — Z171 Estrogen receptor negative status [ER-]: Secondary | ICD-10-CM

## 2015-02-10 LAB — CBC WITH DIFFERENTIAL/PLATELET
BASO%: 1.1 % (ref 0.0–2.0)
BASOS ABS: 0.1 10*3/uL (ref 0.0–0.1)
EOS ABS: 0.1 10*3/uL (ref 0.0–0.5)
EOS%: 1.1 % (ref 0.0–7.0)
HEMATOCRIT: 35 % (ref 34.8–46.6)
HGB: 11.9 g/dL (ref 11.6–15.9)
LYMPH%: 23.3 % (ref 14.0–49.7)
MCH: 31.1 pg (ref 25.1–34.0)
MCHC: 34 g/dL (ref 31.5–36.0)
MCV: 91.4 fL (ref 79.5–101.0)
MONO#: 2.4 10*3/uL — ABNORMAL HIGH (ref 0.1–0.9)
MONO%: 29.1 % — ABNORMAL HIGH (ref 0.0–14.0)
NEUT#: 3.7 10*3/uL (ref 1.5–6.5)
NEUT%: 45.4 % (ref 38.4–76.8)
Platelets: 203 10*3/uL (ref 145–400)
RBC: 3.83 10*6/uL (ref 3.70–5.45)
RDW: 17.9 % — ABNORMAL HIGH (ref 11.2–14.5)
WBC: 8.2 10*3/uL (ref 3.9–10.3)
lymph#: 1.9 10*3/uL (ref 0.9–3.3)
nRBC: 7 % — ABNORMAL HIGH (ref 0–0)

## 2015-02-10 LAB — COMPREHENSIVE METABOLIC PANEL (CC13)
ALBUMIN: 3.1 g/dL — AB (ref 3.5–5.0)
ALK PHOS: 68 U/L (ref 40–150)
ALT: 13 U/L (ref 0–55)
AST: 9 U/L (ref 5–34)
Anion Gap: 11 mEq/L (ref 3–11)
BILIRUBIN TOTAL: 0.55 mg/dL (ref 0.20–1.20)
BUN: 12.9 mg/dL (ref 7.0–26.0)
CO2: 24 mEq/L (ref 22–29)
Calcium: 8.9 mg/dL (ref 8.4–10.4)
Chloride: 103 mEq/L (ref 98–109)
Creatinine: 0.9 mg/dL (ref 0.6–1.1)
EGFR: 89 mL/min/{1.73_m2} — AB (ref >90)
GLUCOSE: 115 mg/dL (ref 70–140)
POTASSIUM: 3.9 meq/L (ref 3.5–5.1)
Sodium: 139 mEq/L (ref 136–145)
Total Protein: 6 g/dL — ABNORMAL LOW (ref 6.4–8.3)

## 2015-02-10 MED ORDER — NYSTATIN-TRIAMCINOLONE 100000-0.1 UNIT/GM-% EX CREA: 1.0000 "application " | TOPICAL_CREAM | Freq: Two times a day (BID) | CUTANEOUS | Status: DC

## 2015-02-10 NOTE — Progress Notes (Signed)
[place]  [phone] Fax:([phone]     ID: Katie Jacobson DOB: 04-29-1956  MR#: [id number]  TOI#:[id number]  Patient Care Team: Debbrah Alar, NP as PCP - General (Internal Medicine) Harle Battiest, MD (Obstetrics and Gynecology) Rolm Bookbinder, MD as Consulting Physician (General Surgery) Chauncey Cruel, MD as Consulting Physician (Oncology) Eppie Gibson, MD as Attending Physician (Radiation Oncology) Rockwell Germany, RN as Registered Nurse Mauro Kaufmann, RN as Registered Nurse Holley Bouche, NP as Nurse Practitioner (Nurse Practitioner) OTHER MD:  CHIEF COMPLAINT: Triple negative breast cancer  CURRENT TREATMENT: Neoadjuvant chemotherapy   BREAST CANCER HISTORY: From the original intake note:  The patient herself palpated a mass in her right breast there is she brought it to her gynecologist, Dr. Alan Ripper attention and he obtained screening mammography which suggested an abnormality in the right breast. On 10/24/2014 the patient underwent right diagnostic mammography and ultrasonography at the breast Center. The breast density was category B. There was an ill-defined mass in the upper outer quadrant of the right breast which was palpable on exam. By ultrasound this was irregular and hypoechoic measuring 1.2 cm. Evaluation of the right axilla was negative.  Biopsy of the mass in question showed an invasive ductal carcinoma, grade 2 or 3, triple negative, with an MIB-1 of 19%.  Katie Jacobson case was presented at the multidisciplinary breast cancer conference 11/19/2014, where was suggested she would benefit from genetics testing. Since this would mandate a significant delay in her final surgery, neoadjuvant chemotherapy was indicated.  The patient's subsequent history is as detailed below  INTERVAL HISTORY: Katie Jacobson returns today for follow-up of her breast cancer, accompanied by her daughter. Today is day 8, cycle 4 of 4 planned  cycles of cyclophosphamide and docetaxel, with neulasta given on day 2 for granulocyte support.  REVIEW OF SYSTEMS: Katie Jacobson is wrapped in a blanket, resting her head on the exam table during this visit. She denies fevers, chills, nausea, vomiting, or changes in bowel or bladder habits. She simply feels achy and tired from the neulasta injection. She is ready to go back home and get in the bed. Her candidal rash persists underneath her breasts. She was not able to obtain the original prescription for the antifungal cream I prescribed. She has been washing the area with a pH balanced soap that helps only temporarily. A detailed review of systems is otherwise stable.   PAST MEDICAL HISTORY: Past Medical History  Diagnosis Date  . Heart palpitations   . Osteoporosis   . Essential hypertension 04/01/2011  . History of partial seizures 1970    never knew why  . Mitral valve prolapse 1990  . Personal history of kidney stones 05/2012  . Obesity (BMI 35.0-39.9 without comorbidity)   . Breast cancer of upper-outer quadrant of right female breast 11/14/2014  . Family history of breast cancer     PAST SURGICAL HISTORY: Past Surgical History  Procedure Laterality Date  . Appendectomy  1976  . Abdominal hysterectomy  1998    ovaries spared  . Tonsillectomy  1970s  . Knee arthroscopy  2006    left  knee  . Cystectomy      Pt reports removal of a "stone" from the right side of her throat as a child  . Tubal ligation  1996  . Foot sx      X2  . Doppler echocardiography  03/27/1989    suggestive eidence of mitral valve proplapse miminal ; trace mitral regurgitation  .  Nm myoview ltd  07/01/1996    EF 62%  . Salivary stone removal  1972    right neck  . Colonoscopy    . Portacath placement N/A 11/27/2014    Procedure: INSERTION PORT-A-CATH;  Surgeon: Rolm Bookbinder, MD;  Location: [facility];  Service: General;  Laterality: N/A;    FAMILY HISTORY Family History  Problem  Relation Age of Onset  . Heart disease Mother   . Stroke Mother   . Hypertension Mother   . Diabetes Mother   . Diabetes Sister   . Kidney disease Brother   . Mental retardation Brother   . Breast cancer Cousin     deceased 59  . Diabetes Sister   . Colon cancer Neg Hx   . Esophageal cancer Neg Hx   . Stomach cancer Neg Hx   . Heart attack Father    the patient knows little about her father. Her mother died at at the age of 62 following a stroke. The patient had 2 brothers, 2 sisters. The only history of breast or ovarian cancer in the family is a first cousin on the mother's side diagnosed with breast cancer at age 34  GYNECOLOGIC HISTORY:  Patient's last menstrual period was 10/10/1996. Menarche age 9, first live birth age 70. The patient is GX P2. She status post simple hysterectomy without salpingo-oophorectomy. She did not take hormone replacement.  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 The patient's husband, Jori Moll "Ron" Linebaugh works for [employer] in the pattern shop. He also works as a Dispensing optician. Daughter Analayah Brooke lives in [place] and works in Programmer, applications. Daughter [name] Plake lives in [place] and works at a [employer]. 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    ADVANCED DIRECTIVES: Not in place   HEALTH MAINTENANCE: History  Substance Use Topics  . Smoking status: Current Every Day Smoker -- 0.50 packs/day for 39 years    Types: Cigarettes  . Smokeless tobacco: Never Used  . Alcohol Use: 4.2 oz/week    7 Cans of beer per week     Colonoscopy:  PAP:  Bone density:  Lipid panel:  Allergies  Allergen Reactions  . Sulfa Antibiotics Other (See Comments)    Unknown reaction    Current Outpatient Prescriptions  Medication Sig Dispense Refill  . chlorthalidone (HYGROTON) 25 MG tablet Take 1 tablet (25 mg total) by mouth every other day. 30 tablet 3  . enalapril (VASOTEC)  20 MG tablet Take 1 tablet (20 mg total) by mouth at bedtime. 30 tablet 11  . LORazepam (ATIVAN) 0.5 MG tablet Take 1 tablet (0.5 mg total) by mouth at bedtime. 30 tablet 0  . metoprolol succinate (TOPROL-XL) 50 MG 24 hr tablet Take 1 tablet (50 mg total) by mouth at bedtime. 30 tablet 11  . oxyCODONE-acetaminophen (PERCOCET) 10-325 MG per tablet Take 1 tablet by mouth every 6 (six) hours as needed for pain. 10 tablet 0  . albuterol (PROVENTIL HFA;VENTOLIN HFA) 108 (90 BASE) MCG/ACT inhaler Inhale into the lungs every 6 (six) hours as needed for wheezing or shortness of breath.    . fluticasone (FLONASE) 50 MCG/ACT nasal spray Place 1 spray into both nostrils as needed.    . lidocaine-prilocaine (EMLA) cream Apply to affected area once (Patient not taking: Reported on 02/10/2015) 30 g 3  . miconazole nitrate (MICATIN) POWD Apply 1 application topically 2 (two) times daily.  Apply to affected areas (Patient not taking: Reported on 02/10/2015) 25 g 0  . nystatin-triamcinolone (MYCOLOG II) cream Apply 1 application topically 2 (two) times daily. 30 g 2  . ondansetron (ZOFRAN) 8 MG tablet Take 1 tablet (8 mg total) by mouth 2 (two) times daily. Start the day after chemo for 3 days. Then take as needed for nausea or vomiting. (Patient not taking: Reported on 02/10/2015) 30 tablet 1  . prochlorperazine (COMPAZINE) 10 MG tablet Take 1 tablet (10 mg total) by mouth every 6 (six) hours as needed (Nausea or vomiting). (Patient not taking: Reported on 12/30/2014) 30 tablet 1  . tobramycin-dexamethasone (TOBRADEX) ophthalmic solution Place 1 drop into both eyes every 6 (six) hours. (Patient not taking: Reported on 12/23/2014) 5 mL 1   No current facility-administered medications for this visit.    OBJECTIVE: Middle-aged African-American woman who appears stated age 25 Vitals:   02/10/15 1155  BP: 118/73  Pulse: 99  Temp: 98.2 F (36.8 C)  Resp: 18     Body mass index is 39.68 kg/(m^2).    ECOG FS:1 -  Symptomatic but completely ambulatory  Skin: warm, dry  HEENT: sclerae anicteric, conjunctivae pink, oropharynx clear. No thrush or mucositis.  Lymph Nodes: No cervical or supraclavicular lymphadenopathy  Lungs: clear to auscultation bilaterally, no rales, wheezes, or rhonci  Heart: regular rate and rhythm  Abdomen: round, soft, non tender, positive bowel sounds  Musculoskeletal: No focal spinal tenderness, no peripheral edema  Neuro: non focal, well oriented, positive affect  Breasts: breast exam deferred. Rash to underneath both breast folds  LAB RESULTS:  CMP     Component Value Date/Time   NA 139 02/10/2015 1134   NA 137 09/08/2013 1120   K 3.9 02/10/2015 1134   K 3.6 09/08/2013 1120   CL 103 09/08/2013 1120   CO2 24 02/10/2015 1134   CO2 21 09/08/2013 1120   GLUCOSE 115 02/10/2015 1134   GLUCOSE 89 09/08/2013 1120   BUN 12.9 02/10/2015 1134   BUN 9 09/08/2013 1120   CREATININE 0.9 02/10/2015 1134   CREATININE 0.85 09/08/2013 1120   CREATININE 0.93 09/02/2013 1128   CALCIUM 8.9 02/10/2015 1134   CALCIUM 9.4 09/08/2013 1120   PROT 6.0* 02/10/2015 1134   PROT 7.0 09/08/2013 1120   ALBUMIN 3.1* 02/10/2015 1134   ALBUMIN 3.4* 09/08/2013 1120   AST 9 02/10/2015 1134   AST 15 09/08/2013 1120   ALT 13 02/10/2015 1134   ALT 12 09/08/2013 1120   ALKPHOS 68 02/10/2015 1134   ALKPHOS 80 09/08/2013 1120   BILITOT 0.55 02/10/2015 1134   BILITOT 0.5 09/08/2013 1120   GFRNONAA 76* 09/08/2013 1120   GFRNONAA 70 09/02/2013 1128   GFRAA 88* 09/08/2013 1120   GFRAA 81 09/02/2013 1128    INo results found for: SPEP, UPEP  Lab Results  Component Value Date   WBC 8.2 02/10/2015   NEUTROABS 3.7 02/10/2015   HGB 11.9 02/10/2015   HCT 35.0 02/10/2015   MCV 91.4 02/10/2015   PLT 203 02/10/2015      Chemistry      Component Value Date/Time   NA 139 02/10/2015 1134   NA 137 09/08/2013 1120   K 3.9 02/10/2015 1134   K 3.6 09/08/2013 1120   CL 103 09/08/2013 1120   CO2 24  02/10/2015 1134   CO2 21 09/08/2013 1120   BUN 12.9 02/10/2015 1134   BUN 9 09/08/2013 1120   CREATININE 0.9 02/10/2015 1134   CREATININE  0.85 09/08/2013 1120   CREATININE 0.93 09/02/2013 1128      Component Value Date/Time   CALCIUM 8.9 02/10/2015 1134   CALCIUM 9.4 09/08/2013 1120   ALKPHOS 68 02/10/2015 1134   ALKPHOS 80 09/08/2013 1120   AST 9 02/10/2015 1134   AST 15 09/08/2013 1120   ALT 13 02/10/2015 1134   ALT 12 09/08/2013 1120   BILITOT 0.55 02/10/2015 1134   BILITOT 0.5 09/08/2013 1120       No results found for: LABCA2  No components found for: LABCA125  No results for input(s): INR in the last 168 hours.  Urinalysis    Component Value Date/Time   COLORURINE YELLOW 09/08/2013 [place] 09/08/2013 1258   LABSPEC 1.019 09/08/2013 1258   PHURINE 8.0 09/08/2013 1258   GLUCOSEU NEGATIVE 09/08/2013 1258   HGBUR TRACE* 09/08/2013 [place] 09/08/2013 1258   KETONESUR 40* 09/08/2013 1258   PROTEINUR NEGATIVE 09/08/2013 1258   UROBILINOGEN 0.2 09/08/2013 1258   NITRITE NEGATIVE 09/08/2013 1258   LEUKOCYTESUR NEGATIVE 09/08/2013 1258    STUDIES: Mr Breast Bilateral W Wo Contrast  02/09/2015   CLINICAL DATA:  Known malignancy in the superior right breast. MRI is performed to assess response to neoadjuvant therapy.  EXAM: BILATERAL BREAST MRI WITH AND WITHOUT CONTRAST  TECHNIQUE: Multiplanar, multisequence MR images of both breasts were obtained prior to and following the intravenous administration of 16 ml of MultiHance.  THREE-DIMENSIONAL MR IMAGE RENDERING ON INDEPENDENT WORKSTATION:  Three-dimensional MR images were rendered by post-processing of the original MR data on an independent workstation. The three-dimensional MR images were interpreted, and findings are reported in the following complete MRI report for this study. Three dimensional images were evaluated at the independent DynaCad workstation  COMPARISON:  Breast MRI  11/21/2014 and bilateral mammogram 10/16/2014  FINDINGS: Breast composition: b. Scattered fibroglandular tissue.  Background parenchymal enhancement: Minimal  Right breast: The biopsy proven mass in the 11 o'clock position of the right breast, middle third, has decreased in size and intensity of enhancement since the prior breast MRI of 11/21/2014. Currently, the enhancing mass measures 1.0 x 0.7 x 0.5 cm (on the prior breast MRI, the mass measured 1.4 x 1.4 x 1.0 cm on the exam from 11/21/2014). Biopsy clip artifact is seen within the malignancy. No additional suspicious areas of enhancement in the right breast. Previously described mammographically stable oval mass medial and posterior to the biopsy-proven cancer is most likely a fibroadenoma and has benign appearances.  Left breast: No mass or abnormal enhancement.  Lymph nodes: No abnormal appearing lymph nodes.  Ancillary findings:  None.  IMPRESSION: Positive response to neoadjuvant therapy. The biopsy proven malignancy 11 o'clock position of the right breast is decreased in size and intensity of enhancement.  RECOMMENDATION: Treatment planning for known right breast cancer.  BI-RADS CATEGORY  6: Known biopsy-proven malignancy.   Electronically Signed   By: Curlene Dolphin M.D.   On: 02/09/2015 13:42    ASSESSMENT: 59 y.o. High Katie Jacobson, [place] woman status post right breast upper outer quadrant biopsy 11/12/2014 for a clinical T1c N0, stage IA invasive ductal carcinoma, triple negative, with an MIB-1 of 19%  (1) neoadjuvant chemotherapy will consist of cyclophosphamide and docetaxel 4 given every 21 days with Neulasta support, first dose 12/02/2014, completed 02/03/2015  (2) genetics testing through the [facility] gene panel at [facility] showed no deleterious mutations in ATM, BARD1, BRCA1, BRCA2, BRIP1, CDH1, CHEK2, MRE11A, MUTYH, NBN, NF1, PALB2,  PTEN, RAD50, RAD51C, RAD51D, oe TP53.  (3) surgery to be guided by genetics results, and to  take place at the conclusion of chemotherapy  (4) radiation to follow surgery  (5) continuing tobacco abuse. The patient has been strongly advised to quit smoking  PLAN: Katie Jacobson is still recovering from her last cycle of chemo, but is elated to know that she is through with this portion of treatment. She had an MRI yesterday that showed a positive response to her neoadjuvant therapy. She will meet with Dr. Donne Hazel next week to discuss surgery.   For her breast rash, I have sent in a new prescription for nystatin/triamcinolone cream. This should help with the irritation.   Katie Jacobson will return in June for labs and a follow up visit with Dr. Jana Hakim. After surgery she will need a radiation consult. She understands and agrees with this plan. She knows the goal of treatment ment in her case is cure. She has been encouraged to call with any issues that might arise before her next visit here.   Laurie Panda, NP   02/10/2015 12:57 PM

## 2015-02-23 ENCOUNTER — Other Ambulatory Visit: Payer: Self-pay | Admitting: General Surgery

## 2015-02-23 DIAGNOSIS — C50411 Malignant neoplasm of upper-outer quadrant of right female breast: Secondary | ICD-10-CM

## 2015-02-26 ENCOUNTER — Encounter (HOSPITAL_BASED_OUTPATIENT_CLINIC_OR_DEPARTMENT_OTHER): Payer: Self-pay | Admitting: *Deleted

## 2015-02-26 NOTE — Progress Notes (Signed)
   02/26/15 1018  OBSTRUCTIVE SLEEP APNEA  Have you ever been diagnosed with sleep apnea through a sleep study? No  Do you snore loudly (loud enough to be heard through closed doors)?  0  Do you often feel tired, fatigued, or sleepy during the daytime? 1  Has anyone observed you stop breathing during your sleep? 0  Do you have, or are you being treated for high blood pressure? 1  BMI more than 35 kg/m2? 1  Age over 57 years old? 1  Gender: 0  Obstructive Sleep Apnea Score 4

## 2015-02-27 ENCOUNTER — Ambulatory Visit
Admission: RE | Admit: 2015-02-27 | Discharge: 2015-02-27 | Disposition: A | Discharge status: Home / Self Care | Payer: BLUE CROSS/BLUE SHIELD | Source: Ambulatory Visit | Attending: General Surgery | Admitting: General Surgery

## 2015-02-27 DIAGNOSIS — C50411 Malignant neoplasm of upper-outer quadrant of right female breast: Secondary | ICD-10-CM

## 2015-03-02 ENCOUNTER — Encounter (HOSPITAL_BASED_OUTPATIENT_CLINIC_OR_DEPARTMENT_OTHER): Payer: Self-pay | Admitting: Anesthesiology

## 2015-03-02 ENCOUNTER — Ambulatory Visit (HOSPITAL_BASED_OUTPATIENT_CLINIC_OR_DEPARTMENT_OTHER): Payer: BLUE CROSS/BLUE SHIELD | Admitting: Anesthesiology

## 2015-03-02 ENCOUNTER — Ambulatory Visit
Admission: RE | Admit: 2015-03-02 | Discharge: 2015-03-02 | Disposition: A | Discharge status: Home / Self Care | Payer: BLUE CROSS/BLUE SHIELD | Source: Ambulatory Visit | Attending: General Surgery | Admitting: General Surgery

## 2015-03-02 ENCOUNTER — Ambulatory Visit (HOSPITAL_BASED_OUTPATIENT_CLINIC_OR_DEPARTMENT_OTHER)
Admission: RE | Admit: 2015-03-02 | Discharge: 2015-03-02 | Disposition: A | Discharge status: Home / Self Care | Payer: BLUE CROSS/BLUE SHIELD | Source: Ambulatory Visit | Attending: General Surgery | Admitting: General Surgery

## 2015-03-02 ENCOUNTER — Encounter (HOSPITAL_BASED_OUTPATIENT_CLINIC_OR_DEPARTMENT_OTHER)
Admission: RE | Disposition: A | Discharge status: Home / Self Care | Payer: Self-pay | Source: Ambulatory Visit | Attending: General Surgery

## 2015-03-02 ENCOUNTER — Ambulatory Visit (HOSPITAL_COMMUNITY)
Admission: RE | Admit: 2015-03-02 | Discharge: 2015-03-02 | Disposition: A | Discharge status: Home / Self Care | Payer: BLUE CROSS/BLUE SHIELD | Source: Ambulatory Visit | Attending: General Surgery | Admitting: General Surgery

## 2015-03-02 DIAGNOSIS — I4891 Unspecified atrial fibrillation: Secondary | ICD-10-CM | POA: Insufficient documentation

## 2015-03-02 DIAGNOSIS — C50411 Malignant neoplasm of upper-outer quadrant of right female breast: Secondary | ICD-10-CM

## 2015-03-02 DIAGNOSIS — G40909 Epilepsy, unspecified, not intractable, without status epilepticus: Secondary | ICD-10-CM | POA: Insufficient documentation

## 2015-03-02 DIAGNOSIS — Z882 Allergy status to sulfonamides status: Secondary | ICD-10-CM | POA: Insufficient documentation

## 2015-03-02 DIAGNOSIS — Z9221 Personal history of antineoplastic chemotherapy: Secondary | ICD-10-CM | POA: Insufficient documentation

## 2015-03-02 DIAGNOSIS — Z87442 Personal history of urinary calculi: Secondary | ICD-10-CM | POA: Diagnosis not present

## 2015-03-02 DIAGNOSIS — C50911 Malignant neoplasm of unspecified site of right female breast: Secondary | ICD-10-CM | POA: Diagnosis present

## 2015-03-02 DIAGNOSIS — Z79899 Other long term (current) drug therapy: Secondary | ICD-10-CM | POA: Diagnosis not present

## 2015-03-02 DIAGNOSIS — I10 Essential (primary) hypertension: Secondary | ICD-10-CM | POA: Insufficient documentation

## 2015-03-02 DIAGNOSIS — M1990 Unspecified osteoarthritis, unspecified site: Secondary | ICD-10-CM | POA: Insufficient documentation

## 2015-03-02 HISTORY — PX: RADIOACTIVE SEED GUIDED PARTIAL MASTECTOMY WITH AXILLARY SENTINEL LYMPH NODE BIOPSY: SHX6520

## 2015-03-02 HISTORY — DX: Personal history of urinary calculi: Z87.442

## 2015-03-02 HISTORY — DX: Personal history of antineoplastic chemotherapy: Z92.21

## 2015-03-02 HISTORY — DX: Personal history of other specified conditions: Z87.898

## 2015-03-02 HISTORY — DX: Other seasonal allergic rhinitis: J30.2

## 2015-03-02 SURGERY — RADIOACTIVE SEED GUIDED PARTIAL MASTECTOMY WITH AXILLARY SENTINEL LYMPH NODE BIOPSY
Anesthesia: Regional | Site: Breast | Laterality: Right

## 2015-03-02 MED ORDER — SODIUM CHLORIDE 0.9 % IJ SOLN
INTRAMUSCULAR | Status: AC
  Filled 2015-03-02: qty 10

## 2015-03-02 MED ORDER — HYDROMORPHONE HCL 1 MG/ML IJ SOLN
INTRAMUSCULAR | Status: AC
  Filled 2015-03-02: qty 1

## 2015-03-02 MED ORDER — LIDOCAINE HCL (CARDIAC) 20 MG/ML IV SOLN
INTRAVENOUS | Status: DC | PRN
  Administered 2015-03-02: 70 mg via INTRAVENOUS

## 2015-03-02 MED ORDER — FENTANYL CITRATE (PF) 100 MCG/2ML IJ SOLN
INTRAMUSCULAR | Status: DC | PRN
  Administered 2015-03-02 (×2): 25 ug via INTRAVENOUS

## 2015-03-02 MED ORDER — MIDAZOLAM HCL 2 MG/2ML IJ SOLN
INTRAMUSCULAR | Status: AC
  Filled 2015-03-02: qty 2

## 2015-03-02 MED ORDER — SODIUM CHLORIDE 0.9 % IJ SOLN
INTRAMUSCULAR | Status: DC | PRN
  Administered 2015-03-02: 5 mL via INTRAMUSCULAR

## 2015-03-02 MED ORDER — EPHEDRINE SULFATE 50 MG/ML IJ SOLN
INTRAMUSCULAR | Status: DC | PRN
Start: 2015-03-02 — End: 2015-03-02
  Administered 2015-03-02 (×2): 10 mg via INTRAVENOUS

## 2015-03-02 MED ORDER — LACTATED RINGERS IV SOLN
INTRAVENOUS | Status: DC
  Administered 2015-03-02 (×2): via INTRAVENOUS

## 2015-03-02 MED ORDER — CEFAZOLIN SODIUM-DEXTROSE 2-3 GM-% IV SOLR
2.0000 g | INTRAVENOUS | Status: AC
  Administered 2015-03-02: 2 g via INTRAVENOUS

## 2015-03-02 MED ORDER — CEFAZOLIN SODIUM-DEXTROSE 2-3 GM-% IV SOLR
INTRAVENOUS | Status: AC
  Filled 2015-03-02: qty 50

## 2015-03-02 MED ORDER — OXYCODONE-ACETAMINOPHEN 10-325 MG PO TABS: 1.0000 | ORAL_TABLET | Freq: Four times a day (QID) | ORAL | Status: DC | PRN

## 2015-03-02 MED ORDER — BUPIVACAINE-EPINEPHRINE (PF) 0.5% -1:200000 IJ SOLN
INTRAMUSCULAR | Status: DC | PRN
  Administered 2015-03-02: 25 mL via PERINEURAL

## 2015-03-02 MED ORDER — ONDANSETRON HCL 4 MG/2ML IJ SOLN: 4.0000 mg | Freq: Four times a day (QID) | INTRAMUSCULAR | Status: DC | PRN

## 2015-03-02 MED ORDER — FENTANYL CITRATE (PF) 100 MCG/2ML IJ SOLN
INTRAMUSCULAR | Status: AC
  Filled 2015-03-02: qty 2

## 2015-03-02 MED ORDER — OXYCODONE HCL 5 MG/5ML PO SOLN: 5.0000 mg | Freq: Once | ORAL | Status: AC | PRN

## 2015-03-02 MED ORDER — OXYCODONE HCL 5 MG PO TABS
ORAL_TABLET | ORAL | Status: AC
  Filled 2015-03-02: qty 1

## 2015-03-02 MED ORDER — PHENYLEPHRINE HCL 10 MG/ML IJ SOLN
10.0000 mg | INTRAVENOUS | Status: DC | PRN
  Administered 2015-03-02: 50 ug/min via INTRAVENOUS

## 2015-03-02 MED ORDER — OXYCODONE HCL 5 MG PO TABS
5.0000 mg | ORAL_TABLET | Freq: Once | ORAL | Status: AC | PRN
  Administered 2015-03-02: 5 mg via ORAL

## 2015-03-02 MED ORDER — FENTANYL CITRATE (PF) 100 MCG/2ML IJ SOLN
INTRAMUSCULAR | Status: AC
  Filled 2015-03-02: qty 4

## 2015-03-02 MED ORDER — PHENYLEPHRINE HCL 10 MG/ML IJ SOLN
INTRAMUSCULAR | Status: DC | PRN
  Administered 2015-03-02 (×2): 80 ug via INTRAVENOUS

## 2015-03-02 MED ORDER — TECHNETIUM TC 99M SULFUR COLLOID FILTERED
1.0000 | Freq: Once | INTRAVENOUS | Status: AC | PRN
  Administered 2015-03-02: 1 via INTRADERMAL

## 2015-03-02 MED ORDER — PROPOFOL 10 MG/ML IV BOLUS
INTRAVENOUS | Status: DC | PRN
  Administered 2015-03-02: 200 mg via INTRAVENOUS

## 2015-03-02 MED ORDER — BUPIVACAINE HCL (PF) 0.25 % IJ SOLN
INTRAMUSCULAR | Status: AC
  Filled 2015-03-02: qty 30

## 2015-03-02 MED ORDER — BUPIVACAINE HCL (PF) 0.25 % IJ SOLN
INTRAMUSCULAR | Status: DC | PRN
Start: 2015-03-02 — End: 2015-03-02
  Administered 2015-03-02: 4 mL

## 2015-03-02 MED ORDER — HYDROMORPHONE HCL 1 MG/ML IJ SOLN
0.2500 mg | INTRAMUSCULAR | Status: DC | PRN
  Administered 2015-03-02 (×3): 0.5 mg via INTRAVENOUS

## 2015-03-02 MED ORDER — PROPOFOL 500 MG/50ML IV EMUL
INTRAVENOUS | Status: AC
  Filled 2015-03-02: qty 50

## 2015-03-02 MED ORDER — METHYLENE BLUE 1 % INJ SOLN
INTRAMUSCULAR | Status: AC
  Filled 2015-03-02: qty 10

## 2015-03-02 MED ORDER — FENTANYL CITRATE (PF) 100 MCG/2ML IJ SOLN
50.0000 ug | INTRAMUSCULAR | Status: DC | PRN
  Administered 2015-03-02: 100 ug via INTRAVENOUS

## 2015-03-02 MED ORDER — ONDANSETRON HCL 4 MG/2ML IJ SOLN
INTRAMUSCULAR | Status: DC | PRN
  Administered 2015-03-02: 4 mg via INTRAVENOUS

## 2015-03-02 MED ORDER — MIDAZOLAM HCL 2 MG/2ML IJ SOLN
0.5000 mg | INTRAMUSCULAR | Status: DC | PRN
  Administered 2015-03-02 (×2): 2 mg via INTRAVENOUS

## 2015-03-02 MED ORDER — DEXAMETHASONE SODIUM PHOSPHATE 4 MG/ML IJ SOLN
INTRAMUSCULAR | Status: DC | PRN
  Administered 2015-03-02: 10 mg via INTRAVENOUS

## 2015-03-02 SURGICAL SUPPLY — 61 items
APPLIER CLIP 9.375 MED OPEN (MISCELLANEOUS)
BENZOIN TINCTURE PRP APPL 2/3 (GAUZE/BANDAGES/DRESSINGS) ×2 IMPLANT
BINDER BREAST LRG (GAUZE/BANDAGES/DRESSINGS) IMPLANT
BINDER BREAST MEDIUM (GAUZE/BANDAGES/DRESSINGS) IMPLANT
BINDER BREAST XLRG (GAUZE/BANDAGES/DRESSINGS) ×2 IMPLANT
BINDER BREAST XXLRG (GAUZE/BANDAGES/DRESSINGS) IMPLANT
BLADE SURG 15 STRL LF DISP TIS (BLADE) ×1 IMPLANT
BLADE SURG 15 STRL SS (BLADE) ×1
CANISTER SUC SOCK COL 7IN (MISCELLANEOUS) IMPLANT
CANISTER SUCT 1200ML W/VALVE (MISCELLANEOUS) ×2 IMPLANT
CHLORAPREP W/TINT 26ML (MISCELLANEOUS) ×2 IMPLANT
CLIP APPLIE 9.375 MED OPEN (MISCELLANEOUS) IMPLANT
COVER BACK TABLE 60X90IN (DRAPES) ×2 IMPLANT
COVER MAYO STAND STRL (DRAPES) ×2 IMPLANT
COVER PROBE W GEL 5X96 (DRAPES) ×2 IMPLANT
DECANTER SPIKE VIAL GLASS SM (MISCELLANEOUS) IMPLANT
DEVICE DUBIN W/COMP PLATE 8390 (MISCELLANEOUS) ×2 IMPLANT
DRAPE LAPAROSCOPIC ABDOMINAL (DRAPES) ×2 IMPLANT
DRSG TEGADERM 4X4.75 (GAUZE/BANDAGES/DRESSINGS) ×2 IMPLANT
ELECT COATED BLADE 2.86 ST (ELECTRODE) ×2 IMPLANT
ELECT REM PT RETURN 9FT ADLT (ELECTROSURGICAL) ×2
ELECTRODE REM PT RTRN 9FT ADLT (ELECTROSURGICAL) ×1 IMPLANT
GLOVE BIO SURGEON STRL SZ7 (GLOVE) ×4 IMPLANT
GLOVE BIOGEL PI IND STRL 7.0 (GLOVE) ×1 IMPLANT
GLOVE BIOGEL PI IND STRL 7.5 (GLOVE) ×1 IMPLANT
GLOVE BIOGEL PI INDICATOR 7.0 (GLOVE) ×1
GLOVE BIOGEL PI INDICATOR 7.5 (GLOVE) ×1
GLOVE ECLIPSE 6.5 STRL STRAW (GLOVE) ×4 IMPLANT
GLOVE EXAM NITRILE EXT CUFF MD (GLOVE) ×2 IMPLANT
GOWN STRL REUS W/ TWL LRG LVL3 (GOWN DISPOSABLE) ×3 IMPLANT
GOWN STRL REUS W/TWL LRG LVL3 (GOWN DISPOSABLE) ×3
KIT MARKER MARGIN INK (KITS) ×2 IMPLANT
LIQUID BAND (GAUZE/BANDAGES/DRESSINGS) ×2 IMPLANT
MARKER SKIN DUAL TIP RULER LAB (MISCELLANEOUS) ×2 IMPLANT
NDL SAFETY ECLIPSE 18X1.5 (NEEDLE) ×1 IMPLANT
NEEDLE HYPO 18GX1.5 SHARP (NEEDLE) ×1
NEEDLE HYPO 25X1 1.5 SAFETY (NEEDLE) ×4 IMPLANT
NS IRRIG 1000ML POUR BTL (IV SOLUTION) ×2 IMPLANT
PACK BASIN DAY SURGERY FS (CUSTOM PROCEDURE TRAY) ×2 IMPLANT
PENCIL BUTTON HOLSTER BLD 10FT (ELECTRODE) ×2 IMPLANT
SHEET MEDIUM DRAPE 40X70 STRL (DRAPES) IMPLANT
SLEEVE SCD COMPRESS KNEE MED (MISCELLANEOUS) ×2 IMPLANT
SPONGE GAUZE 4X4 12PLY STER LF (GAUZE/BANDAGES/DRESSINGS) ×2 IMPLANT
SPONGE LAP 4X18 X RAY DECT (DISPOSABLE) ×2 IMPLANT
STAPLER VISISTAT 35W (STAPLE) ×2 IMPLANT
STOCKINETTE IMPERVIOUS LG (DRAPES) IMPLANT
STRIP CLOSURE SKIN 1/2X4 (GAUZE/BANDAGES/DRESSINGS) ×2 IMPLANT
SUT ETHILON 2 0 FS 18 (SUTURE) IMPLANT
SUT MNCRL AB 4-0 PS2 18 (SUTURE) ×2 IMPLANT
SUT MON AB 5-0 PS2 18 (SUTURE) IMPLANT
SUT SILK 2 0 SH (SUTURE) ×4 IMPLANT
SUT VIC AB 2-0 SH 27 (SUTURE) ×3
SUT VIC AB 2-0 SH 27XBRD (SUTURE) ×3 IMPLANT
SUT VIC AB 3-0 SH 27 (SUTURE) ×1
SUT VIC AB 3-0 SH 27X BRD (SUTURE) ×1 IMPLANT
SUT VIC AB 5-0 PS2 18 (SUTURE) IMPLANT
SYR CONTROL 10ML LL (SYRINGE) ×4 IMPLANT
TOWEL OR 17X24 6PK STRL BLUE (TOWEL DISPOSABLE) ×4 IMPLANT
TOWEL OR NON WOVEN STRL DISP B (DISPOSABLE) IMPLANT
TUBE CONNECTING 20X1/4 (TUBING) ×2 IMPLANT
YANKAUER SUCT BULB TIP NO VENT (SUCTIONS) ×2 IMPLANT

## 2015-03-02 NOTE — Transfer of Care (Signed)
Immediate Anesthesia Transfer of Care Note  Patient: Katie Jacobson  Procedure(s) Performed: Procedure(s): RADIOACTIVE SEED GUIDED RIGHT PARTIAL MASTECTOMY WITH RIGHT AXILLARY SENTINEL LYMPH NODE BIOPSY (Right)  Patient Location: PACU  Anesthesia Type:General  Level of Consciousness: sedated  Airway & Oxygen Therapy: Patient Spontanous Breathing and Patient connected to face mask oxygen  Post-op Assessment: Report given to RN and Post -op Vital signs reviewed and stable  Post vital signs: Reviewed and stable  Last Vitals:  Filed Vitals:   03/02/15 1105  BP:   Pulse: 85  Temp:   Resp: 10    Complications: No apparent anesthesia complications

## 2015-03-02 NOTE — Interval H&P Note (Signed)
History and Physical Interval Note:  03/02/2015 9:20 AM  Katie Jacobson  has presented today for surgery, with the diagnosis of right breast cancer  The various methods of treatment have been discussed with the patient and family. After consideration of risks, benefits and other options for treatment, the patient has consented to  Procedure(s): RADIOACTIVE SEED GUIDED RIGHT PARTIAL MASTECTOMY WITH RIGHT AXILLARY SENTINEL LYMPH NODE BIOPSY (Right) as a surgical intervention .  The patient's history has been reviewed, patient examined, no change in status, stable for surgery.  I have reviewed the patient's chart and labs.  Questions were answered to the patient's satisfaction.     [name]

## 2015-03-02 NOTE — H&P (Signed)
4 yof who underwent screening mm that showed a right breast uoq mass. On [place] this measures 9 mmx 1.1cmx 1.2cm. Her breast density is B. US of the axilla is negative. She has undergone core biopsy that shows a grade II IDC that is triple negative with a Ki of 20%. Her genetic testing was negative. She has undergone primary chemo with cyclophosphamide and docetaxel. She completed this on April 26. she tolerated this well and is doing fine today. She underwent repeat mri at end of therapy which is below:   EXAM: BILATERAL BREAST MRI WITH AND WITHOUT CONTRAST TECHNIQUE: Multiplanar, multisequence MR images of both breasts were obtained prior to and following the intravenous administration of 16 ml of MultiHance. THREE-DIMENSIONAL MR IMAGE RENDERING ON INDEPENDENT WORKSTATION: Three-dimensional MR images were rendered by post-processing of the original MR data on an independent workstation. The three-dimensional MR images were interpreted, and findings are reported in the following complete MRI report for this study. Three dimensional images were evaluated at the independent DynaCad workstation COMPARISON: Breast MRI 11/21/2014 and bilateral mammogram 10/16/2014 FINDINGS: Breast composition: b. Scattered fibroglandular tissue. Background parenchymal enhancement: Minimal Right breast: The biopsy proven mass in the 11 o'clock position of the right breast, middle third, has decreased in size and intensity of enhancement since the prior breast MRI of 11/21/2014. Currently, the enhancing mass measures 1.0 x 0.7 x 0.5 cm (on the prior breast MRI, the mass measured 1.4 x 1.4 x 1.0 cm on the exam from 11/21/2014). Biopsy clip artifact is seen within the malignancy. No additional suspicious areas of enhancement in the right breast. Previously described mammographically stable oval mass medial and posterior to the biopsy-proven cancer is most likely a fibroadenoma and has benign appearances. Left breast: No mass or  abnormal enhancement. Lymph nodes: No abnormal appearing lymph nodes. Ancillary findings: None. IMPRESSION: Positive response to neoadjuvant therapy. The biopsy proven malignancy 11 o'clock position of the right breast is decreased in size and intensity of enhancement.   Other Problems Rolm Bookbinder, MD; 02/23/2015 3:33 PM) Arthritis Atrial Fibrillation Back Pain Breast Cancer Chest pain Heart murmur High blood pressure Kidney Stone Lump In Breast Other disease, cancer, significant illness Seizure Disorder  Past Surgical History Rolm Bookbinder, MD; 02/23/2015 3:33 PM) Appendectomy Breast Biopsy Right. Cesarean Section - 1 Foot Surgery Left. Hysterectomy (not due to cancer) - Partial Knee Surgery Left. Oral Surgery Tonsillectomy  Allergies Lars Mage Spillers, CMA; 02/23/2015 1:22 PM) Sulfabenzamide *CHEMICALS*  Medication History (Alisha Spillers, CMA; 02/23/2015 1:25 PM) Oxycodone-Acetaminophen (10-325MG  Tablet, Oral) Active. LORazepam (0.5MG  Tablet, Oral) Active. Chlorthalidone (25MG  Tablet, Oral) Active. Enalapril Maleate (20MG  Tablet, Oral) Active. Fluticasone Propionate (50MCG/ACT Suspension, Nasal) Active. Lidocaine-Prilocaine (2.5-2.5% Cream, External) Active. Metoprolol Succinate ER (50MG  Tablet ER 24HR, Oral) Active. Nystatin-Triamcinolone (100000-0.1UNIT/GM-% Cream, External) Active. Ondansetron HCl (8MG  Tablet, Oral) Active. Prochlorperazine Maleate (10MG  Tablet, Oral) Active. Tobramycin-Dexamethasone (0.3-0.1% Suspension, Ophthalmic) Active. Dexamethasone (4MG  Tablet, Oral) Active. Medications Reconciled  Social History Rolm Bookbinder, MD; 02/23/2015 3:33 PM) Alcohol use Moderate alcohol use. No caffeine use No drug use Tobacco use Current every day smoker.  Family History Rolm Bookbinder, MD; 02/23/2015 3:33 PM) Breast Cancer Family Members In General. Cerebrovascular Accident Mother. Colon Polyps  Daughter. Diabetes Mellitus Brother, Sister. Heart Disease Mother. Heart disease in female family member before age 1 Hypertension Mother. Kidney Disease Brother.  Vitals (Alisha Spillers CMA; 02/23/2015 1:21 PM) 02/23/2015 1:21 PM Weight: 216 lb Height: 62in Body Surface Area: 2.07 m Body Mass Index: 39.51 kg/m Pulse: 98 (Regular)  BP: 124/74 (  Sitting, Left Arm, Standard)    Physical Exam Rolm Bookbinder MD; 02/23/2015 3:31 PM) General Mental Status-Alert. Orientation-Oriented X3.  Chest and Lung Exam Chest and lung exam reveals -on auscultation, normal breath sounds, no adventitious sounds and normal vocal resonance.  Breast Nipples-No Discharge. Breast Lump-No Palpable Breast Mass.  Cardiovascular Cardiovascular examination reveals -normal heart sounds, regular rate and rhythm with no murmurs.  Lymphatic Head & Neck  General Head & Neck Lymphatics: Bilateral - Description - Normal. Axillary  General Axillary Region: Bilateral - Description - Normal. Note: no [place] adenopathy     Assessment & Plan Rolm Bookbinder MD; 02/23/2015 3:33 PM) STAGE I BREAST CANCER, RIGHT (174.9  C50.911) Story: Right radioactive seed guided lumpectomy, right axillary sentinel node biopsy she has done well with chemotherapy and has a good response to chemotherapy although there appears to be some tumor present. We discussed a sentinel lymph node biopsy as she does not appear to having lymph node involvement right now. We discussed the performance of that with injection of radioactive tracer and blue dye. We discussed that she would have an incision underneath her axillary hairlin. We discussed that there is a chance of having a positive node with a sentinel lymph node biopsy and we will await the permanent pathology to make any other first further decisions in terms of her treatment. One of these options might be to return to the operating room to perform an  axillary lymph node dissection. We discussed up to a 5% risk lifetime of chronic shoulder pain as well as lymphedema associated with a sentinel lymph node biopsy. We discussed the options for treatment of the breast cancer which included lumpectomy versus a mastectomy. We discussed the performance of the lumpectomy with radioactive seed placement. We discussed a 5-10% chance of a positive margin requiring reexcision in the operating room. We also discussed that she will need radiation therapy (this is usually 5-7 weeks) if she undergoes lumpectomy. The breast cannot undergo more radiation therapy in the same breast after lumpectomy in the future. We discussed the mastectomy (removal of whole breast) and the postoperative care for that as well. Mastectomy can be followed by reconstruction. This is a more extensive surgery and requires more recovery. Most mastectomy patients will not need radiation therapy. We discussed that there is no difference in her survival whether she undergoes lumpectomy with radiation therapy or antiestrogen therapy versus a mastectomy. There is also no real difference between her recurrence in the breast. We discussed the risks of operation including bleeding, infection.

## 2015-03-02 NOTE — Interval H&P Note (Signed)
History and Physical Interval Note:  03/02/2015 9:22 AM  Katie Jacobson  has presented today for surgery, with the diagnosis of right breast cancer  The various methods of treatment have been discussed with the patient and family. After consideration of risks, benefits and other options for treatment, the patient has consented to  Procedure(s): RADIOACTIVE SEED GUIDED RIGHT PARTIAL MASTECTOMY WITH RIGHT AXILLARY SENTINEL LYMPH NODE BIOPSY (Right) as a surgical intervention .  The patient's history has been reviewed, patient examined, no change in status, stable for surgery.  I have reviewed the patient's chart and labs.  Questions were answered to the patient's satisfaction.     [name]

## 2015-03-02 NOTE — Anesthesia Preprocedure Evaluation (Signed)
Anesthesia Evaluation  Patient identified by MRN, date of birth, ID band Patient awake    Reviewed: Allergy & Precautions, NPO status , Patient's Chart, lab work & pertinent test results  Airway Mallampati: II   Neck ROM: full    Dental   Pulmonary asthma , Current Smoker,  breath sounds clear to auscultation        Cardiovascular hypertension, Rhythm:regular Rate:Normal     Neuro/Psych    GI/Hepatic   Endo/Other  Morbid obesity  Renal/GU      Musculoskeletal   Abdominal   Peds  Hematology   Anesthesia Other Findings   Reproductive/Obstetrics                             Anesthesia Physical Anesthesia Plan  ASA: II  Anesthesia Plan: General and Regional   Post-op Pain Management: MAC Combined w/ Regional for Post-op pain   Induction: Intravenous  Airway Management Planned: LMA  Additional Equipment:   Intra-op Plan:   Post-operative Plan:   Informed Consent: I have reviewed the patients History and Physical, chart, labs and discussed the procedure including the risks, benefits and alternatives for the proposed anesthesia with the patient or authorized representative who has indicated his/her understanding and acceptance.     Plan Discussed with: CRNA, Anesthesiologist and Surgeon  Anesthesia Plan Comments:         Anesthesia Quick Evaluation

## 2015-03-02 NOTE — Op Note (Signed)
Preoperative diagnosis: clinical stage I tn right breast cancer s/p primary chemotherapy Postoperative diagnosis: same as above Procedure: 1. Right breast seed guided lumpectomy 2. Right axillary sn biopsy 3. Injection blue dye for sn identification Surgeon Dr Serita Grammes Anes general with pectoral block EBL: minimal Comps none Specimen:  1. Right breast marked with paint 2. Sentinel node (there are several together), highest count 179, one larger palpable with no counts 3. Additional posterior margin marked short superior long lateral double deep 4. Additional medial margin marked same way as 3 5. Additional superior margin short superior Sponge count correct at completion dispo to recovery stable  Indications: this is a 56 yof who presents after undergoing primary chemotherapy for right tnbc. She had genetic testing in interim that was negative and has a partial mri response. We discussed proceeding with lumpectomy/sn biopsy. A radioactive seed was placed in the right breast by radiology prior to surgery. i had these mammograms available.  Procedure: After informed consent was obtained the patient was taken to the OR. She was given ancef. SCDs were in place. She was prepped and draped in the standard sterile surgical fashion. A timeout was performed.  A mixture of methylene blue dye and saline was injected in the periareolar position. This was massaged.  I then located the radioactive seed. I infiltrated marcaine in the skin. I then used the neoprobe the guided the excision of the seed and the surrounding tissue with an attempt to get a clear margin.  The seed was present by the neoprobe in the specimen. There was no more iodine radioactivity in the breast.  The seed and clip were confirmed by radiology to be in the specimen by mammogram. I thought I was close and remove the posterior, superior and medial margins and marked as above. The deep margin is the muscle.  I then obtained  hemostasis.  Clips were placed in cavity.  I then closed with 2-0 vicryl, 3-0 vicryl and 4-0 monocryl. Glue and steristrips were applied.  I then located the sentinel node. I made an incision below the axillary hairline.  I went through the axillary fascia.  I was able to identify a palpable node along with a node with a count as above. I removed these together. There were no more palpable, blue or radioactive nodes present.  I obtained hemostasis. I did place a clip on a small vessel.  I then closed the fascia with 2-0 vicryl The skin was closed with 3-0 vicryl and 4-0 monocryl. Glue and steristrips were applied.  A breast binder was placed. She was extubated and transferred to recovery stable.

## 2015-03-02 NOTE — Progress Notes (Signed)
  Assisted Dr. Hodierne with right, ultrasound guided, pectoralis block. Side rails up, monitors on throughout procedure. See vital signs in flow sheet. Tolerated Procedure well. 

## 2015-03-02 NOTE — Discharge Instructions (Signed)
[place] Office Phone Number [phone]   POST OP INSTRUCTIONS  Always review your discharge instruction sheet given to you by the facility where your surgery was performed.  IF YOU HAVE DISABILITY OR FAMILY LEAVE FORMS, YOU MUST BRING THEM TO THE OFFICE FOR PROCESSING.  DO NOT GIVE THEM TO YOUR DOCTOR.  1. A prescription for pain medication may be given to you upon discharge.  Take your pain medication as prescribed, if needed.  If narcotic pain medicine is not needed, then you may take acetaminophen (Tylenol), naprosyn (Alleve) or ibuprofen (Advil) as needed. 2. Take your usually prescribed medications unless otherwise directed 3. If you need a refill on your pain medication, please contact your pharmacy.  They will contact our office to request authorization.  Prescriptions will not be filled after 5pm or on week-ends. 4. You should eat very light the first 24 hours after surgery, such as soup, crackers, pudding, etc.  Resume your normal diet the day after surgery. 5. Most patients will experience some swelling and bruising in the breast.  Ice packs and a good support bra will help.  Wear the breast binder provided or a sports bra for 72 hours day and night.  After that wear a sports bra during the day until you return to the office. Swelling and bruising can take several days to resolve.  6. It is common to experience some constipation if taking pain medication after surgery.  Increasing fluid intake and taking a stool softener will usually help or prevent this problem from occurring.  A mild laxative (Milk of Magnesia or Miralax) should be taken according to package directions if there are no bowel movements after 48 hours. 7. Unless discharge instructions indicate otherwise, you may remove your bandages 48 hours after surgery and you may shower at that time.  You may have steri-strips (small skin tapes) in place directly over the incision.  These strips should be left on the  skin for 7-10 days and will come off on their own.  If your surgeon used skin glue on the incision, you may shower in 24 hours.  The glue will flake off over the next 2-3 weeks.  Any sutures or staples will be removed at the office during your follow-up visit. 8. ACTIVITIES:  You may resume regular daily activities (gradually increasing) beginning the next day.  Wearing a good support bra or sports bra minimizes pain and swelling.  You may have sexual intercourse when it is comfortable. a. You may drive when you no longer are taking prescription pain medication, you can comfortably wear a seatbelt, and you can safely maneuver your car and apply brakes. b. RETURN TO WORK:  ______________________________________________________________________________________ 9. You should see your doctor in the office for a follow-up appointment approximately two weeks after your surgery.  Your doctors nurse will typically make your follow-up appointment when she calls you with your pathology report.  Expect your pathology report 3-4 business days after your surgery.  You may call to check if you do not hear from [place] after three days. 10. OTHER INSTRUCTIONS: _______________________________________________________________________________________________ _____________________________________________________________________________________________________________________________________ _____________________________________________________________________________________________________________________________________ _____________________________________________________________________________________________________________________________________  WHEN TO CALL DR WAKEFIELD: 1. Fever over 101.0 2. Nausea and/or vomiting. 3. Extreme swelling or bruising. 4. Continued bleeding from incision. 5. Increased pain, redness, or drainage from the incision.  The clinic staff is available to answer your questions during regular  business hours.  Please dont hesitate to call and ask to speak to one of the nurses for clinical concerns.  If you have a medical emergency, go to the nearest emergency room or call 911.  A surgeon from [facility] is always on call at the hospital.  For further questions, please visit [email or web address]     Post Anesthesia Home Care Instructions  Activity: Get plenty of rest for the remainder of the day. A responsible adult should stay with you for 24 hours following the procedure.  For the next 24 hours, DO NOT: -Drive a car -Paediatric nurse -Drink alcoholic beverages -Take any medication unless instructed by your physician -Make any legal decisions or sign important papers.  Meals: Start with liquid foods such as gelatin or soup. Progress to regular foods as tolerated. Avoid greasy, spicy, heavy foods. If nausea and/or vomiting occur, drink only clear liquids until the nausea and/or vomiting subsides. Call your physician if vomiting continues.  Special Instructions/Symptoms: Your throat may feel dry or sore from the anesthesia or the breathing tube placed in your throat during surgery. If this causes discomfort, gargle with warm salt water. The discomfort should disappear within 24 hours.  If you had a scopolamine patch placed behind your ear for the management of post- operative nausea and/or vomiting:  1. The medication in the patch is effective for 72 hours, after which it should be removed.  Wrap patch in a tissue and discard in the trash. Wash hands thoroughly with soap and water. 2. You may remove the patch earlier than 72 hours if you experience unpleasant side effects which may include dry mouth, dizziness or visual disturbances. 3. Avoid touching the patch. Wash your hands with soap and water after contact with the patch.   Call your surgeon if you experience:   1.  Fever over 101.0. 2.  Inability to urinate. 3.  Nausea and/or vomiting. 4.   Extreme swelling or bruising at the surgical site. 5.  Continued bleeding from the incision. 6.  Increased pain, redness or drainage from the incision. 7.  Problems related to your pain medication. 8. Any change in color, movement and/or sensation 9. Any problems and/or concerns

## 2015-03-02 NOTE — Anesthesia Procedure Notes (Addendum)
Anesthesia Regional Block:  Pectoralis block  Pre-Anesthetic Checklist: ,, timeout performed, Correct Patient, Correct Site, Correct Laterality, Correct Procedure, Correct Position, site marked, Risks and benefits discussed,  Surgical consent,  Pre-op evaluation,  At surgeon's request and post-op pain management  Laterality: Right  Prep: chloraprep       Needles:   Needle Type: Echogenic Needle     Needle Length: 9cm 9 cm Needle Gauge: 21 and 21 G    Additional Needles:  Procedures: ultrasound guided (picture in chart) Pectoralis block Narrative:  Start time: 03/02/2015 9:27 AM End time: 03/02/2015 9:36 AM Injection made incrementally with aspirations every 5 mL.  Performed by: Personally  Anesthesiologist: HODIERNE, ADAM  Additional Notes: Pt tolerated the procedure well.   Procedure Name: LMA Insertion Date/Time: 03/02/2015 10:58 AM Performed by: Maryella Shivers Pre-anesthesia Checklist: Patient identified, Emergency Drugs available, Suction available and Patient being monitored Patient Re-evaluated:Patient Re-evaluated prior to inductionOxygen Delivery Method: Circle System Utilized Preoxygenation: Pre-oxygenation with 100% oxygen Intubation Type: IV induction Ventilation: Mask ventilation without difficulty LMA: LMA inserted LMA Size: 4.0 Number of attempts: 1 Airway Equipment and Method: Bite block Placement Confirmation: positive ETCO2 Tube secured with: Tape Dental Injury: Teeth and Oropharynx as per pre-operative assessment

## 2015-03-03 ENCOUNTER — Encounter (HOSPITAL_BASED_OUTPATIENT_CLINIC_OR_DEPARTMENT_OTHER): Payer: Self-pay | Admitting: General Surgery

## 2015-03-03 NOTE — Anesthesia Postprocedure Evaluation (Signed)
Anesthesia Post Note  Patient: Katie Jacobson  Procedure(s) Performed: Procedure(s) (LRB): RADIOACTIVE SEED GUIDED RIGHT PARTIAL MASTECTOMY WITH RIGHT AXILLARY SENTINEL LYMPH NODE BIOPSY (Right)  Anesthesia type: General  Patient location: PACU  Post pain: Pain level controlled and Adequate analgesia  Post assessment: Post-op Vital signs reviewed, Patient's Cardiovascular Status Stable, Respiratory Function Stable, Patent Airway and Pain level controlled  Last Vitals:  Filed Vitals:   03/02/15 1343  BP: 110/69  Pulse: 77  Temp: 36.4 C  Resp: 14    Post vital signs: Reviewed and stable  Level of consciousness: awake, alert  and oriented  Complications: No apparent anesthesia complications

## 2015-03-18 NOTE — Progress Notes (Signed)
Location of Breast Cancer: Right Breast UOQ Invasive Ductal Carcinoma  Histology per Pathology Report:   03/02/15 Diagnosis 1. Breast, lumpectomy, Right - INVASIVE GRADE I DUCTAL CARCINOMA WITH ASSOCIATED HIGH GRADE DUCTAL CARCINOMA IN SITU SPANNING 1 CM IN GREATEST DIMENSION. - DUCTAL CARCINOMA IN SITU IS LESS THAN 0.1 CM FROM INITIAL MEDIAL MARGIN (PLEASE SEE THIRD SPECIMEN FOR FINAL MARGIN STATUS). - INVASIVE DUCTAL CARCINOMA IS WITHIN 0.1 CM OF INITIAL MEDIAL MARGIN (PLEASE SEE THIRD SPECIMEN FOR FINAL MARGIN STATUS). - OTHER MARGINS ARE NEGATIVE. - SEE ONCOLOGY TEMPLATE. 2. Breast, excision, Right additional posterior margin - BENIGN BREAST PARENCHYMA WITH FIBROCYSTIC CHANGES AND FIBROADENOMA FORMATION. - NO ATYPIA OR MALIGNANCY IDENTIFIED. 3. Breast, excision, Right breast additional medial margin - BREAST TISSUE WITH FIBROCYSTIC CHANGES. - BENIGN FIBROFATTY SOFT TISSUE. - NO ATYPIA OR MALIGNANCY IDENTIFIED. 4. Breast, excision, Right additional superior margin - BENIGN FIBROFATTY SOFT TISSUE. - NO ATYPIA OR MALIGNANCY IDENTIFIED. 5. Lymph node, sentinel, biopsy, Right axillary #1 - ONE BENIGN LYMPH NODE WITH NO TUMOR SEEN (0/1). 6. Lymph node, sentinel, biopsy, Right axillary - ONE BENIGN LYMPH NODE WITH NO TUMOR SEEN (0/1). 7. Lymph node, sentinel, biopsy, Right axillary - ONE BENIGN LYMPH NODE WITH NO TUMOR SEEN (0/1). 8. Lymph node, sentinel, biopsy, Right axillary - ONE BENIGN LYMPH NODE WITH NO TUMOR SEEN (0/1).  11/12/14 Diagnosis Breast, right, needle core biopsy, mass, upper outer quadrant - INVASIVE DUCTAL CARCINOMA. - SEE COMMENT. Microscopic Comment Although definitive grading of breast carcinoma is best done on excision, the features of the invasive tumor from the right upper outer quadrant breast mass are compatible with a grade II-III breast carcinoma. Breast prognostic markers will be performed and reported in an addendum. The findings are called to the  [facility] [place] on 11/13/14. Dr. Lyndon Code has seen this case in consultation with agreement.  Receptor Status: ER(0%), PR (0%), Her2-neu (negative)  Did patient present with symptoms (if so, please note symptoms) or was this found on screening mammography?:self-palpated a right breast UOQ mass   Past/Anticipated interventions by surgeon, if any: 03/02/15 - Procedure: RADIOACTIVE SEED GUIDED RIGHT PARTIAL MASTECTOMY WITH RIGHT AXILLARY SENTINEL LYMPH NODE BIOPSY;  Surgeon: Rolm Bookbinder, MD;  Location: [facility];  Service: General;  Laterality: Right;  Past/Anticipated interventions by medical oncology, if any: neoadjuvant chemotherapy will consist of cyclophosphamide and docetaxel 4 given every 21 days with Neulasta support, first dose 12/02/2014, completed 02/03/2015  Lymphedema issues, if any:  Yes - right upper arm.  Pain issues, if any:  no does have soreness in the right breast from surgery.   OB GYN history: Patient's last menstrual period was 10/10/1996. Menarche age 19, first live birth age 72. The patient is GX P2. She status post simple hysterectomy without salpingo-oophorectomy. She did not take hormone replacement.  SAFETY ISSUES:  Prior radiation? no  Pacemaker/ICD? no  Possible current pregnancy?no  Is the patient on methotrexate? no  Current Complaints / other details:  Patient has 2 daughters. She reports she is still a little sore in her right breast from surgery.  BP 118/70 mmHg  Pulse 80  Temp(Src) 97.9 F (36.6 C) (Oral)  Resp 16  Ht '5\' 1"'  (1.549 m)  Wt 212 lb 4.8 oz (96.299 kg)  BMI 40.13 kg/m2  LMP 10/10/1996

## 2015-03-19 ENCOUNTER — Ambulatory Visit (HOSPITAL_BASED_OUTPATIENT_CLINIC_OR_DEPARTMENT_OTHER): Payer: BLUE CROSS/BLUE SHIELD | Admitting: Oncology

## 2015-03-19 ENCOUNTER — Other Ambulatory Visit (HOSPITAL_BASED_OUTPATIENT_CLINIC_OR_DEPARTMENT_OTHER): Payer: BLUE CROSS/BLUE SHIELD

## 2015-03-19 ENCOUNTER — Telehealth: Payer: Self-pay | Admitting: Oncology

## 2015-03-19 VITALS — BP 105/71 | HR 86 | Temp 98.0°F | Resp 18 | Ht 61.0 in | Wt 211.0 lb

## 2015-03-19 DIAGNOSIS — C50411 Malignant neoplasm of upper-outer quadrant of right female breast: Secondary | ICD-10-CM

## 2015-03-19 DIAGNOSIS — Z171 Estrogen receptor negative status [ER-]: Secondary | ICD-10-CM | POA: Diagnosis not present

## 2015-03-19 DIAGNOSIS — G620 Drug-induced polyneuropathy: Secondary | ICD-10-CM

## 2015-03-19 DIAGNOSIS — G4700 Insomnia, unspecified: Secondary | ICD-10-CM | POA: Diagnosis not present

## 2015-03-19 DIAGNOSIS — Z720 Tobacco use: Secondary | ICD-10-CM

## 2015-03-19 LAB — CBC WITH DIFFERENTIAL/PLATELET
BASO%: 1.2 % (ref 0.0–2.0)
BASOS ABS: 0.1 10*3/uL (ref 0.0–0.1)
EOS%: 6.1 % (ref 0.0–7.0)
Eosinophils Absolute: 0.3 10*3/uL (ref 0.0–0.5)
HEMATOCRIT: 37.4 % (ref 34.8–46.6)
HEMOGLOBIN: 12.3 g/dL (ref 11.6–15.9)
LYMPH#: 1.4 10*3/uL (ref 0.9–3.3)
LYMPH%: 25.3 % (ref 14.0–49.7)
MCH: 31.1 pg (ref 25.1–34.0)
MCHC: 32.8 g/dL (ref 31.5–36.0)
MCV: 94.6 fL (ref 79.5–101.0)
MONO#: 0.5 10*3/uL (ref 0.1–0.9)
MONO%: 8.7 % (ref 0.0–14.0)
NEUT#: 3.3 10*3/uL (ref 1.5–6.5)
NEUT%: 58.7 % (ref 38.4–76.8)
Platelets: 242 10*3/uL (ref 145–400)
RBC: 3.96 10*6/uL (ref 3.70–5.45)
RDW: 17.2 % — AB (ref 11.2–14.5)
WBC: 5.5 10*3/uL (ref 3.9–10.3)

## 2015-03-19 LAB — COMPREHENSIVE METABOLIC PANEL (CC13)
ALT: 7 U/L (ref 0–55)
AST: 11 U/L (ref 5–34)
Albumin: 3.3 g/dL — ABNORMAL LOW (ref 3.5–5.0)
Alkaline Phosphatase: 70 U/L (ref 40–150)
Anion Gap: 9 mEq/L (ref 3–11)
BILIRUBIN TOTAL: 0.48 mg/dL (ref 0.20–1.20)
BUN: 15.7 mg/dL (ref 7.0–26.0)
CO2: 25 meq/L (ref 22–29)
Calcium: 9.3 mg/dL (ref 8.4–10.4)
Chloride: 108 mEq/L (ref 98–109)
Creatinine: 0.8 mg/dL (ref 0.6–1.1)
GLUCOSE: 92 mg/dL (ref 70–140)
Potassium: 4 mEq/L (ref 3.5–5.1)
SODIUM: 142 meq/L (ref 136–145)
TOTAL PROTEIN: 6.6 g/dL (ref 6.4–8.3)

## 2015-03-19 NOTE — Telephone Encounter (Signed)
Tried calling patient unable to reach. Mailed calendar for September.

## 2015-03-19 NOTE — Addendum Note (Signed)
Addended by: Prentiss Bells on: 03/19/2015 01:29 PM   Modules accepted: Medications

## 2015-03-19 NOTE — Progress Notes (Signed)
[facility]  [phone] [phone]     ID: Katie Jacobson DOB: 1956/08/17  MR#: [id number]  JYN#:[id number]  Patient Care Team: Katie Craze, NP as PCP - General (Internal Medicine) Katie Kail, MD (Obstetrics and Gynecology) Katie Loron, MD as Consulting Physician (General Surgery) Katie Dell, MD as Consulting Physician (Oncology) Katie Peak, MD as Attending Physician (Radiation Oncology) Katie Angelica, RN as Registered Nurse Katie Proud, RN as Registered Nurse Katie Hartshorn, NP as Nurse Practitioner (Nurse Practitioner) OTHER MD:  CHIEF COMPLAINT: Triple negative breast cancer  CURRENT TREATMENT: Awaiting adjuvant radiation   BREAST CANCER HISTORY: From the original intake note:  The patient herself palpated a mass in her right breast there is she brought it to her gynecologist, Katie Jacobson attention and he obtained screening mammography which suggested an abnormality in the right breast. On 10/24/2014 the patient underwent right diagnostic mammography and ultrasonography at the breast Center. The breast density was category B. There was an ill-defined mass in the upper outer quadrant of the right breast which was palpable on exam. By ultrasound this was irregular and hypoechoic measuring 1.2 cm. Evaluation of the right axilla was negative.  Biopsy of the mass in question showed an invasive ductal carcinoma, grade 2 or 3, triple negative, with an MIB-1 of 19%.  Katie Jacobson case was presented at the multidisciplinary breast cancer conference 11/19/2014, where was suggested she would benefit from genetics testing. Since this would mandate a significant delay in her final surgery, neoadjuvant chemotherapy was indicated.  The patient's subsequent history is as detailed below  INTERVAL HISTORY: Katie Jacobson returns today for follow-up of her breast cancer. Since her last visit here she underwent right lumpectomy and  sentinel lymph node sampling 03/02/2015. The final pathology ([id number]) showed a residual 1.0 cm invasive ductal carcinoma, grade 1, with initially close margins improved through reexcision and the same procedure, so that the final margins were ample. All 4 sentinel lymph nodes were clear. Repeat HER-2 was again negative.   REVIEW OF SYSTEMS: Katie Jacobson i did well with the surgery, with a little bit of pain the first 3 days only. She took Percocet those days and it did not constipate her. On the other hand she is still having significant hypersensitivity involving the medial aspect of the right upper arm and a little bit the right forearm as well. She also has developed some numbness in the radial distribution of the right hand. She did not have this preop. On the other hand she is developed also numbness and tingling across both feet involving the toes and the balls of the feet. She notices this worse at night. She's also having night sweats. Aside from these issues a detailed review of systems today was stable   PAST MEDICAL HISTORY: Past Medical History  Diagnosis Date  . Osteoporosis   . Mitral valve prolapse 1990  . Breast cancer of upper-outer quadrant of right female breast 11/14/2014  . History of seizures     as a child - unknown cause - states was never on anticonvulsants  . History of kidney stones   . Seasonal allergies   . Hypertension     states under control with meds., has been on med. x "years"  . History of chemotherapy     finished chemo 02/03/2015    PAST SURGICAL HISTORY: Past Surgical History  Procedure Laterality Date  . Appendectomy  1976  . Abdominal hysterectomy  1998    partial  .  Tonsillectomy  1970s  . Knee arthroscopy Left 03/26/2008  . Tubal ligation  1996  . Salivary stone removal Right 1972  . Colonoscopy    . Portacath placement N/A 11/27/2014    Procedure: INSERTION PORT-A-CATH;  Surgeon: Katie Loron, MD;  Location: [facility];   Service: General;  Laterality: N/A;  . Plantar's wart excision Left     x 2  . Radioactive seed guided mastectomy with axillary sentinel lymph node biopsy Right 03/02/2015    Procedure: RADIOACTIVE SEED GUIDED RIGHT PARTIAL MASTECTOMY WITH RIGHT AXILLARY SENTINEL LYMPH NODE BIOPSY;  Surgeon: Katie Loron, MD;  Location: [facility];  Service: General;  Laterality: Right;    FAMILY HISTORY Family History  Problem Relation Age of Onset  . Heart disease Mother   . Stroke Mother   . Hypertension Mother   . Diabetes Mother   . Diabetes Sister   . Kidney disease Brother   . Mental retardation Brother   . Breast cancer Cousin     deceased 19  . Diabetes Sister   . Heart attack Father    the patient knows little about her father. Her mother died at at the age of 4 following a stroke. The patient had 2 brothers, 2 sisters. The only history of breast or ovarian cancer in the family is a first cousin on the mother's side diagnosed with breast cancer at age 60  GYNECOLOGIC HISTORY:  Patient's last menstrual period was 10/10/1996. Menarche age 67, first live birth age 77. The patient is GX P2. She status post simple hysterectomy without salpingo-oophorectomy. She did not take hormone replacement.  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 The patient's husband, Katie Jacobson works for [employer] in the pattern shop. He also works as a Geologist, engineering. Daughter Katie Jacobson lives in [place] and works in Presenter, broadcasting. Daughter Katie Jacobson lives in [place] and works at a steakhouse. 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    ADVANCED DIRECTIVES: Not in place   HEALTH MAINTENANCE: History  Substance Use Topics  . Smoking status: Current Every Day Smoker -- 0.00 packs/day for 43 years    Types: Cigarettes  . Smokeless tobacco: Never Used     Comment: 8 cig./day  . Alcohol Use: No      Colonoscopy:  PAP:  Bone density:  Lipid panel:  Allergies  Allergen Reactions  . Sulfa Antibiotics Other (See Comments)    UNKNOWN    Current Outpatient Prescriptions  Medication Sig Dispense Refill  . albuterol (PROVENTIL HFA;VENTOLIN HFA) 108 (90 BASE) MCG/ACT inhaler Inhale into the lungs every 6 (six) hours as needed for wheezing or shortness of breath.    . chlorthalidone (HYGROTON) 25 MG tablet Take 1 tablet (25 mg total) by mouth every other day. 30 tablet 3  . enalapril (VASOTEC) 20 MG tablet Take 1 tablet (20 mg total) by mouth at bedtime. 30 tablet 11  . fluticasone (FLONASE) 50 MCG/ACT nasal spray Place 1 spray into both nostrils as needed.    . metoprolol succinate (TOPROL-XL) 50 MG 24 hr tablet Take 1 tablet (50 mg total) by mouth at bedtime. 30 tablet 11  . oxyCODONE-acetaminophen (PERCOCET) 10-325 MG per tablet Take 1 tablet by mouth every 6 (six) hours as needed for pain. 20 tablet 0   No current facility-administered medications for this visit.    OBJECTIVE: Middle-aged African-American woman in no  acute distress Filed Vitals:   03/19/15 1147  BP: 105/71  Pulse: 86  Temp: 98 F (36.7 C)  Resp: 18     Body mass index is 39.89 kg/(m^2).    ECOG FS:1 - Symptomatic but completely ambulatory  Sclerae unicteric, EOMs intact Oropharynx clear, slightly dry No cervical or supraclavicular adenopathy Lungs no rales or rhonchi Heart regular rate and rhythm Abd soft, obese, nontender, positive bowel sounds MSK no focal spinal tenderness, no upper extremity lymphedema Neuro: nonfocal, well oriented, appropriate affect Breasts: The right breast is status post recent lumpectomy. The cosmetic result is excellent. Steri-Strips are still in place. There is no evidence of dehiscence, swelling, or erythema. The right axilla is benign. The left breast is unremarkable  LAB RESULTS:  CMP     Component Value Date/Time   NA 139 02/10/2015 1134   NA 137 09/08/2013 1120    K 3.9 02/10/2015 1134   K 3.6 09/08/2013 1120   CL 103 09/08/2013 1120   CO2 24 02/10/2015 1134   CO2 21 09/08/2013 1120   GLUCOSE 115 02/10/2015 1134   GLUCOSE 89 09/08/2013 1120   BUN 12.9 02/10/2015 1134   BUN 9 09/08/2013 1120   CREATININE 0.9 02/10/2015 1134   CREATININE 0.85 09/08/2013 1120   CREATININE 0.93 09/02/2013 1128   CALCIUM 8.9 02/10/2015 1134   CALCIUM 9.4 09/08/2013 1120   PROT 6.0* 02/10/2015 1134   PROT 7.0 09/08/2013 1120   ALBUMIN 3.1* 02/10/2015 1134   ALBUMIN 3.4* 09/08/2013 1120   AST 9 02/10/2015 1134   AST 15 09/08/2013 1120   ALT 13 02/10/2015 1134   ALT 12 09/08/2013 1120   ALKPHOS 68 02/10/2015 1134   ALKPHOS 80 09/08/2013 1120   BILITOT 0.55 02/10/2015 1134   BILITOT 0.5 09/08/2013 1120   GFRNONAA 76* 09/08/2013 1120   GFRNONAA 70 09/02/2013 1128   GFRAA 88* 09/08/2013 1120   GFRAA 81 09/02/2013 1128    INo results found for: SPEP, UPEP  Lab Results  Component Value Date   WBC 5.5 03/19/2015   NEUTROABS 3.3 03/19/2015   HGB 12.3 03/19/2015   HCT 37.4 03/19/2015   MCV 94.6 03/19/2015   PLT 242 03/19/2015      Chemistry      Component Value Date/Time   NA 139 02/10/2015 1134   NA 137 09/08/2013 1120   K 3.9 02/10/2015 1134   K 3.6 09/08/2013 1120   CL 103 09/08/2013 1120   CO2 24 02/10/2015 1134   CO2 21 09/08/2013 1120   BUN 12.9 02/10/2015 1134   BUN 9 09/08/2013 1120   CREATININE 0.9 02/10/2015 1134   CREATININE 0.85 09/08/2013 1120   CREATININE 0.93 09/02/2013 1128      Component Value Date/Time   CALCIUM 8.9 02/10/2015 1134   CALCIUM 9.4 09/08/2013 1120   ALKPHOS 68 02/10/2015 1134   ALKPHOS 80 09/08/2013 1120   AST 9 02/10/2015 1134   AST 15 09/08/2013 1120   ALT 13 02/10/2015 1134   ALT 12 09/08/2013 1120   BILITOT 0.55 02/10/2015 1134   BILITOT 0.5 09/08/2013 1120       No results found for: LABCA2  No components found for: LABCA125  No results for input(s): INR in the last 168  hours.  Urinalysis    Component Value Date/Time   COLORURINE YELLOW 09/08/2013 1258   APPEARANCEUR CLEAR 09/08/2013 1258   LABSPEC 1.019 09/08/2013 1258   PHURINE 8.0 09/08/2013 1258   GLUCOSEU NEGATIVE 09/08/2013 1258  HGBUR TRACE* 09/08/2013 1258   BILIRUBINUR NEGATIVE 09/08/2013 1258   KETONESUR 40* 09/08/2013 1258   PROTEINUR NEGATIVE 09/08/2013 1258   UROBILINOGEN 0.2 09/08/2013 1258   NITRITE NEGATIVE 09/08/2013 1258   LEUKOCYTESUR NEGATIVE 09/08/2013 1258    STUDIES: Nm Sentinel Node Inj-no Rpt (breast)  03/02/2015   CLINICAL DATA: right axillary sn biopsy   Sulfur colloid was injected intradermally by the nuclear medicine  technologist for breast cancer sentinel node localization.    Mm Breast Surgical Specimen  03/02/2015   CLINICAL DATA:  Biopsy proven invasive ductal carcinoma of the right breast.  EXAM: SPECIMEN RADIOGRAPH OF THE RIGHT BREAST  COMPARISON:  Previous exam(s).  FINDINGS: Status post excision of the right breast. The radioactive seed and biopsy marker clip are present, completely intact, and were marked for pathology.  IMPRESSION: Specimen radiograph of the right breast.   Electronically Signed   By: Baird Lyons M.D.   On: 03/02/2015 11:10   Mm Rt Radioactive Seed Loc Mammo Guide  02/27/2015   CLINICAL DATA:  Preoperative radioactive seed placement prior to lumpectomy for biopsy proven breast cancer right breast 11 o'clock location  EXAM: MAMMOGRAPHIC GUIDED RADIOACTIVE SEED LOCALIZATION OF THE right BREAST  COMPARISON:  Previous exam(s).  FINDINGS: Patient presents for radioactive seed localization prior to lumpectomy. I met with the patient and we discussed the procedure of seed localization including benefits and alternatives. We discussed the high likelihood of a successful procedure. We discussed the risks of the procedure including infection, bleeding, tissue injury and further surgery. We discussed the low dose of radioactivity involved in the procedure.  Informed, written consent was given.  The usual time-out protocol was performed immediately prior to the procedure.  Using mammographic guidance, sterile technique, 2% lidocaine and an I-125 radioactive seed, the mass and coil shaped clip were localized using a superior to inferior approach. The follow-up mammogram images confirm the seed in the expected location and were marked for Dr. Dwain Sarna. Due to extensive patient motion and fibrosis at the previously biopsied tumor location, the radioactive seed could not be immediately adjacent to the clip, and is located 6 mm superior to the biopsy clip.  Follow-up survey of the patient confirms presence of the radioactive seed.  Order number of I-125 seed:  [id number].  Total activity:  0.251 mCi  Reference Date: 02/12/2015  The patient tolerated the procedure well and was released from the [facility]. She was given instructions regarding seed removal.  IMPRESSION: Radioactive seed localization right breast. No apparent complications.   Electronically Signed   By: Christiana Pellant M.D.   On: 02/27/2015 13:54    ASSESSMENT: 59 y.o. [place], [place] woman status post right breast upper outer quadrant biopsy 11/12/2014 for a clinical T1c N0, stage IA invasive ductal carcinoma, triple negative, with an MIB-1 of 19%  (1) neoadjuvant chemotherapy consisted of cyclophosphamide and docetaxel 4 given every 21 days with Neulasta support, first dose 12/02/2014, completed 02/03/2015  (2) genetics testing through the BreastNext gene panel at [facility] showed no deleterious mutations in ATM, BARD1, BRCA1, BRCA2, BRIP1, CDH1, CHEK2, MRE11A, MUTYH, NBN, NF1, PALB2, PTEN, RAD50, RAD51C, RAD51D, oe TP53.  (3) status post right lumpectomy and sentinel node sampling 03/02/2015 for a ypT1b ypN0 stage IA invasive ductal carcinoma, grade 1, with negative margins. Repeat HER-2 was again not amplified  (4) radiation to follow surgery  (5) continuing tobacco abuse.  The patient has been strongly advised to quit smoking  PLAN: Kimberl did well  with surgery and it is very favorable that all her lymph nodes were negative. I am hopeful she will have a good prognosis long-term. She wanted to know if she was "in remission" or cured. I think the phrase she is looking for is "cancer free". We discussed the fact that what people really mean by "cancer free" is what we mean by "remission"--namely that there is no evidence of active cancer--but it does sound better!  She is having some neuropathy from the taxanes. I'm starting her on gabapentin 300 mg at bedtime. That should help her insomnia as an hot flashes as well as the nighttime neuropathy.  She is now ready to proceed to radiation. She can have her port removed anytime that is convenient for her and Dr. Dwain Sarna.  She will see [place] again in 3 months. She will see Dr. Dwain Sarna 6 months from now. We will continue to "tag team her" in that fashion for the first 2 years after which we will broaden the follow-up interval and consider transitioning to the survivorship program  Shell Yandow C, MD   03/19/2015 12:10 PM

## 2015-03-20 ENCOUNTER — Ambulatory Visit
Admission: RE | Admit: 2015-03-20 | Discharge: 2015-03-20 | Disposition: A | Discharge status: Home / Self Care | Payer: BLUE CROSS/BLUE SHIELD | Source: Ambulatory Visit | Attending: Radiation Oncology | Admitting: Radiation Oncology

## 2015-03-20 ENCOUNTER — Encounter: Payer: Self-pay | Admitting: Radiation Oncology

## 2015-03-20 VITALS — BP 118/70 | HR 80 | Temp 97.9°F | Resp 16 | Ht 61.0 in | Wt 212.3 lb

## 2015-03-20 DIAGNOSIS — Z9221 Personal history of antineoplastic chemotherapy: Secondary | ICD-10-CM | POA: Diagnosis not present

## 2015-03-20 DIAGNOSIS — Z510 Encounter for antineoplastic radiation therapy: Secondary | ICD-10-CM | POA: Insufficient documentation

## 2015-03-20 DIAGNOSIS — C50411 Malignant neoplasm of upper-outer quadrant of right female breast: Secondary | ICD-10-CM | POA: Diagnosis not present

## 2015-03-20 DIAGNOSIS — R5383 Other fatigue: Secondary | ICD-10-CM | POA: Diagnosis not present

## 2015-03-20 HISTORY — DX: Malignant neoplasm of unspecified site of right female breast: C50.911

## 2015-03-20 NOTE — Progress Notes (Signed)
Please see the Nurse Progress Note in the MD Initial Consult Encounter for this patient. 

## 2015-03-20 NOTE — Progress Notes (Signed)
Radiation Oncology         [phone] ________________________________  Name: Katie Jacobson MRN: [id number]  Date: 03/20/2015  DOB: Feb 08, 1956  Follow-Up Visit Note  Outpatient  CC: Nance Pear., NP  Rolm Bookbinder, MD  Diagnosis:      ICD-9-CM ICD-10-CM   1. Breast cancer of upper-outer quadrant of right female breast 174.4 C50.411   Right breast invasive ductal carcinoma, triple negative, clinical T1cN0M0, ypT1b ypN0  Narrative:  The patient returns today for follow-up.     Since consultation, she underwent neoadjuvant chemo in the form of cyclophosphamide and docetaxel x4 cycles. Completed on 4/26. Of note, genetic testing is negative. She underwent right lumpectomy and sentinel node biopsy on 03/02/15. This revealed a garde I-II invasive ductal carcinoma which was 1.0 cm with clear margins and 0/4 nodes were postive. Her disease is triple negative. Her stage is ypTIbN0. The pt denies drainage.  The pt reports fatigue.           ALLERGIES:  is allergic to sulfa antibiotics.  Meds: Current Outpatient Prescriptions  Medication Sig Dispense Refill  . albuterol (PROVENTIL HFA;VENTOLIN HFA) 108 (90 BASE) MCG/ACT inhaler Inhale into the lungs every 6 (six) hours as needed for wheezing or shortness of breath.    . chlorthalidone (HYGROTON) 25 MG tablet Take 1 tablet (25 mg total) by mouth every other day. 30 tablet 3  . enalapril (VASOTEC) 20 MG tablet Take 1 tablet (20 mg total) by mouth at bedtime. 30 tablet 11  . fluticasone (FLONASE) 50 MCG/ACT nasal spray Place 1 spray into both nostrils as needed.    . metoprolol succinate (TOPROL-XL) 50 MG 24 hr tablet Take 1 tablet (50 mg total) by mouth at bedtime. 30 tablet 11  . dexamethasone (DECADRON) 4 MG tablet TAKE 2 TABLETS BY MOUTH TWICE A DAY *START THE DAY BEFORE TAXOTERE THEN AGAIN THE DAY AFTER CHEM X3  1  . nystatin-triamcinolone (MYCOLOG II) cream APPLY 1 APPLICATION TOPICALLY 2 (TWO) TIMES DAILY.  2  .  oxyCODONE-acetaminophen (PERCOCET) 10-325 MG per tablet Take 1 tablet by mouth every 6 (six) hours as needed. for pain  0   No current facility-administered medications for this encounter.    Physical Findings:  height is '5\' 1"'  (1.549 m) and weight is 212 lb 4.8 oz (96.299 kg). Her oral temperature is 97.9 F (36.6 C). Her blood pressure is 118/70 and her pulse is 80. Her respiration is 16. .     General: Alert and oriented, in no acute distress HEENT: Head is normocephalic. Extraocular movements are intact. Oropharynx is clear. Neck: Neck is supple, no palpable cervical or supraclavicular lymphadenopathy. Heart: Regular in rate and rhythm with no murmurs, rubs, or gallops. Chest: Clear to auscultation bilaterally, with no rhonchi, wheezes, or rales. Abdomen: Soft, nontender, nondistended, with no rigidity or guarding. Extremities: No cyanosis or edema. Lymphatics: see Neck Exam Musculoskeletal: symmetric strength and muscle tone throughout. Neurologic: No obvious focalities. Speech is fluent.  Psychiatric: Judgment and insight are intact. Affect is appropriate. Breast exam reveals healing lumpectomy and axillary scars in the upper out quadrant of the right breast.  Lab Findings: Lab Results  Component Value Date   WBC 5.5 03/19/2015   HGB 12.3 03/19/2015   HCT 37.4 03/19/2015   MCV 94.6 03/19/2015   PLT 242 03/19/2015    Radiographic Findings: Nm Sentinel Node Inj-no Rpt (breast)  03/02/2015   CLINICAL DATA: right axillary sn biopsy   Sulfur colloid was injected intradermally by  the nuclear medicine  technologist for breast cancer sentinel node localization.    Mm Breast Surgical Specimen  03/02/2015   CLINICAL DATA:  Biopsy proven invasive ductal carcinoma of the right breast.  EXAM: SPECIMEN RADIOGRAPH OF THE RIGHT BREAST  COMPARISON:  Previous exam(s).  FINDINGS: Status post excision of the right breast. The radioactive seed and biopsy marker clip are present, completely  intact, and were marked for pathology.  IMPRESSION: Specimen radiograph of the right breast.   Electronically Signed   By: Lillia Mountain M.D.   On: 03/02/2015 11:10   Mm Rt Radioactive Seed Loc Mammo Guide  02/27/2015   CLINICAL DATA:  Preoperative radioactive seed placement prior to lumpectomy for biopsy proven breast cancer right breast 11 o'clock location  EXAM: MAMMOGRAPHIC GUIDED RADIOACTIVE SEED LOCALIZATION OF THE right BREAST  COMPARISON:  Previous exam(s).  FINDINGS: Patient presents for radioactive seed localization prior to lumpectomy. I met with the patient and we discussed the procedure of seed localization including benefits and alternatives. We discussed the high likelihood of a successful procedure. We discussed the risks of the procedure including infection, bleeding, tissue injury and further surgery. We discussed the low dose of radioactivity involved in the procedure. Informed, written consent was given.  The usual time-out protocol was performed immediately prior to the procedure.  Using mammographic guidance, sterile technique, 2% lidocaine and an I-125 radioactive seed, the mass and coil shaped clip were localized using a superior to inferior approach. The follow-up mammogram images confirm the seed in the expected location and were marked for Dr. Donne Hazel. Due to extensive patient motion and fibrosis at the previously biopsied tumor location, the radioactive seed could not be immediately adjacent to the clip, and is located 6 mm superior to the biopsy clip.  Follow-up survey of the patient confirms presence of the radioactive seed.  Order number of I-125 seed:  [id number].  Total activity:  0.251 mCi  Reference Date: 02/12/2015  The patient tolerated the procedure well and was released from the [facility]. She was given instructions regarding seed removal.  IMPRESSION: Radioactive seed localization right breast. No apparent complications.   Electronically Signed   By: Conchita Paris M.D.    On: 02/27/2015 13:54    Impression/Plan: Right breast cancer.  We discussed adjuvant radiotherapy today.  I recommend whole breast radiotherapy to the right breast in order to reduce her risk of a locoregional recurrence by two-thirds.  The risks, benefits and side effects of this treatment were discussed in detail.  She understands that radiotherapy is associated with skin irritation and fatigue in the acute setting. Late effects can include cosmetic changes and rare injury to internal organs. She is enthusiastic about proceeding with treatment. A consent form has been signed and placed in her chart.  Simulation will be scheduled for the last week of June.    This document serves as a record of services personally performed by Eppie Gibson, MD. It was created on her behalf by Darcus Austin, a trained medical scribe. The creation of this record is based on the scribe's personal observations and the provider's statements to them. This document has been checked and approved by the attending provider.     _____________________________________   Eppie Gibson, MD

## 2015-03-24 ENCOUNTER — Other Ambulatory Visit: Payer: Self-pay | Admitting: *Deleted

## 2015-03-24 DIAGNOSIS — C50411 Malignant neoplasm of upper-outer quadrant of right female breast: Secondary | ICD-10-CM

## 2015-03-24 MED ORDER — GABAPENTIN 300 MG PO CAPS: 300.0000 mg | ORAL_CAPSULE | Freq: Every day | ORAL | Status: DC

## 2015-03-24 NOTE — Telephone Encounter (Signed)
Patient called reporting her "pharmacy doesn't have the gabapentin Dr. Jana Hakim told her she should start on 03-19-2015".  Observed office note plan with instructions or gabapentin.  This nurse sent order in as indicated for neuropathy, insomnia and hot flashes.  Questions asked about the medication dose, use etc.  Assured her this is a low dose and to call if any adverse reactions or changes when she starts the medication.  We will need to instruct her to taper off.  Asked about protective sex.  I finished chemotherapy in April.  Advised to always have protected sex.  We advise protected sex for at least 72 hours after chemotherapy.  "I've been married 38 years, am not worried and do not like protected sex."

## 2015-03-27 ENCOUNTER — Telehealth: Payer: Self-pay | Admitting: *Deleted

## 2015-03-27 NOTE — Telephone Encounter (Signed)
A LITTLE PIECE OF TOE NAIL IS LEFT. PT. FINISHED CHEMO ON 02/03/15. HOW LONG AFTER CHEMO SHOULD PT. WITH HER HUSBAND HAVE PROTECTIVE SEX?

## 2015-03-29 NOTE — Telephone Encounter (Signed)
Not sure what the issue w the toenail is-- is the patient having swelling and redness or is the nail beginning to grow back?  Toenails if they come off can take up to 8 months to regenrate  No need fro protected sex at this point-- the chemo is long out of the patient's body  Thanks!

## 2015-03-30 NOTE — Telephone Encounter (Signed)
LEFT MESSAGE ON PT.'S VOICE MAIL TO RETURN A CALL TO TRIAGE CONCERNING A RESPONSE TO HER 03/27/15 QUESTION.

## 2015-03-31 ENCOUNTER — Ambulatory Visit: Payer: BLUE CROSS/BLUE SHIELD | Attending: General Surgery | Admitting: Physical Therapy

## 2015-03-31 DIAGNOSIS — M25511 Pain in right shoulder: Secondary | ICD-10-CM

## 2015-03-31 DIAGNOSIS — M25611 Stiffness of right shoulder, not elsewhere classified: Secondary | ICD-10-CM | POA: Diagnosis not present

## 2015-03-31 DIAGNOSIS — I890 Lymphedema, not elsewhere classified: Secondary | ICD-10-CM | POA: Insufficient documentation

## 2015-03-31 NOTE — Telephone Encounter (Signed)
SPOKE TO PT. THERE IS NO SWELLING OR REDNESS AND THE TOENAIL IS GROWING BACK. DR.MAGRINAT'S RESPONSE GIVEN TO PT.

## 2015-03-31 NOTE — Therapy (Signed)
[place], [place], [address] Phone: [phone]   Fax:  [phone]  Physical Therapy Evaluation  Patient Details  Name: Katie Jacobson MRN: [id number] Date of Birth: 10-22-1955 Referring Provider:  Rolm Bookbinder, MD  Encounter Date: 03/31/2015      PT End of Session - 03/31/15 1751    Visit Number 1   Number of Visits 9   Date for PT Re-Evaluation 04/30/15   PT Start Time 1350   PT Stop Time 1440   PT Time Calculation (min) 50 min   Activity Tolerance Patient tolerated treatment well   Behavior During Therapy [facility] for tasks assessed/performed      Past Medical History  Diagnosis Date  . Osteoporosis   . Mitral valve prolapse 1990  . Breast cancer of upper-outer quadrant of right female breast 11/14/2014  . History of seizures     as a child - unknown cause - states was never on anticonvulsants  . History of kidney stones   . Seasonal allergies   . Hypertension     states under control with meds., has been on med. x "years"  . History of chemotherapy     finished chemo 02/03/2015  . Breast cancer, right breast     Past Surgical History  Procedure Laterality Date  . Appendectomy  1976  . Abdominal hysterectomy  1998    partial  . Tonsillectomy  1970s  . Knee arthroscopy Left 03/26/2008  . Tubal ligation  1996  . Salivary stone removal Right 1972  . Colonoscopy    . Portacath placement N/A 11/27/2014    Procedure: INSERTION PORT-A-CATH;  Surgeon: Rolm Bookbinder, MD;  Location: [place];  Service: General;  Laterality: N/A;  . Plantar's wart excision Left     x 2  . Radioactive seed guided mastectomy with axillary sentinel lymph node biopsy Right 03/02/2015    Procedure: RADIOACTIVE SEED GUIDED RIGHT PARTIAL MASTECTOMY WITH RIGHT AXILLARY SENTINEL LYMPH NODE BIOPSY;  Surgeon: Rolm Bookbinder, MD;  Location: [facility] [place];  Service: General;   Laterality: Right;    There were no vitals filed for this visit.  Visit Diagnosis:  Stiffness of joint, shoulder region, right  Lymphedema  Right shoulder pain      Subjective Assessment - 03/31/15 1358    Subjective I'm doing kinda OK  Pt states she has been active post surgery . Her arm is very tender and feels tender and tight    Pertinent History Patient diagnosed 11/13/14 with right Triple negative breast cancer with a Ki67 of 20%.  completed chemotherapy  Had umpectomy with 3 (?) nodes removed  on may 23 Goes for CT simulation on june 29 to have port removed on june 27   Patient Stated Goals to get my arm feeling back to normal    Currently in Pain? Yes   Pain Score 5    Pain Location Arm  around the elbow   Pain Orientation Right   Pain Descriptors / Indicators Tightness   Pain Type Acute pain   Pain Radiating Towards toward the upper arm    Pain Onset 1 to 4 weeks ago  since surgery    Pain Frequency Intermittent   Aggravating Factors  movement    Pain Relieving Factors massaging             [facility] PT Assessment - 03/31/15 0001    Assessment   Medical Diagnosis right breast cancer  Onset Date/Surgical Date 11/13/14   Precautions   Precautions Other (comment)  Active cancer   Restrictions   Weight Bearing Restrictions No   Balance Screen   Has the patient fallen in the past 6 months No   Has the patient had a decrease in activity level because of a fear of falling?  No   Home Social worker Private residence   Living Arrangements Spouse/significant other;Children  husband and 49 year old daughter   Available Help at Discharge Family   Prior Function   Level of Independence Independent with basic ADLs   Vocation Unemployed   Vocation Requirements regular household chores    Leisure plans to start exercise    Cognition   Overall Cognitive Status Within Functional Limits for tasks assessed   Observation/Other Assessments   Observations  pt appears to be protective of moving right arm She is not able to straighten elbow due to pain and guitar string cording at antecubital fossa   Skin Integrity incisions healing well    Observation/Other Assessments-Edema    Edema Circumferential   Sensation   Additional Comments fingertip tingling at tips of thumb first and second finget    Coordination   Gross Motor Movements are Fluid and Coordinated Yes   Posture/Postural Control   Posture/Postural Control Postural limitations   Postural Limitations Rounded Shoulders;Forward head   ROM / Strength   AROM / PROM / Strength Strength;AROM  could not test right UE strength due to pain   AROM   Right Shoulder Extension --   Right Shoulder Flexion 84 Degrees   Right Shoulder ABduction 105 Degrees   Right Shoulder Internal Rotation 60 Degrees   Right Shoulder External Rotation 80 Degrees   Left Shoulder Extension --   Left Shoulder Flexion --   Left Shoulder ABduction --   Left Shoulder Internal Rotation --   Left Shoulder External Rotation --   Right Elbow Extension -28  multiple palpabel "guitar string cords palpable and painful    Strength   Overall Strength Deficits;Due to pain   Overall Strength Comments limited in all joints of right upper extremtiy   Strength Assessment Site --           LYMPHEDEMA/ONCOLOGY QUESTIONNAIRE - 03/31/15 1414    What other symptoms do you have   [place] Sign No   Right Upper Extremity Lymphedema   10 cm Proximal to Olecranon Process 38.5 cm   Olecranon Process 29 cm   10 cm Proximal to Ulnar Styloid Process 28.7 cm   Just Proximal to Ulnar Styloid Process 17.5 cm   Across Hand at [employer] 19 cm   At [place] of 2nd Digit 5.6 cm   Other about 20 # weight loss since last measure    Left Upper Extremity Lymphedema   10 cm Proximal to Olecranon Process 36 cm   Olecranon Process 27.5 cm   10 cm Proximal to Ulnar Styloid Process 26 cm   Just Proximal to Ulnar Styloid Process 16.5 cm    Across Hand at [employer] 19 cm   At [place] of 2nd Digit 5.7 cm           Quick Dash - 03/31/15 0001    Open a tight or new jar Moderate difficulty   Do heavy household chores (wash walls, wash floors) Severe difficulty   Carry a shopping bag or briefcase Moderate difficulty   Wash your back Moderate difficulty   Use a knife  to cut food Mild difficulty   Recreational activities in which you take some force or impact through your arm, shoulder, or hand (golf, hammering, tennis) Severe difficulty   During the past week, to what extent has your arm, shoulder or hand problem interfered with your normal social activities with family, friends, neighbors, or groups? Quite a bit   During the past week, to what extent has your arm, shoulder or hand problem limited your work or other regular daily activities Modererately   Arm, shoulder, or hand pain. Moderate   Tingling (pins and needles) in your arm, shoulder, or hand Moderate   Difficulty Sleeping Mild difficulty   DASH Score 52.27 %                        Short Term Clinic Goals - 03/31/15 1759    CC Short Term Goal  #1   Title Short term goals = long term goals           Breast Clinic Goals - 11/19/14 1626    Patient will be able to verbalize understanding of pertinent lymphedema risk reduction practices relevant to her diagnosis specifically related to skin care.   Time 1   Period Days   Status Achieved   Patient will be able to return demonstrate and/or verbalize understanding of the post-op home exercise program related to regaining shoulder range of motion.   Time 1   Period Days   Status Achieved   Patient will be able to verbalize understanding of the importance of attending the postoperative After Breast Cancer Class for further lymphedema risk reduction education and therapeutic exercise.   Time 1   Period Days   Status Achieved          Long Term Clinic Goals - 03/31/15 1759    CC Long Term  Goal  #1   Title Patient will report a decrease in pain by 50% so they can perform daily activities with greater ease   Time 4   Period Weeks   Status New   CC Long Term Goal  #2   Title Patient will improve shoulder flexion range of motion to 120 degrees to position for radiation treatment   Time 4   Period Weeks   Status New   CC Long Term Goal  #3   Title Patient will have a circumferential reduction of   2        cm at    10  cm above the         ulnar styloid    Baseline 28.7   Time 4   Period Weeks   Status New   CC Long Term Goal  #4   Title Patient will decrease the DASH score to < 35   to demonstrate increased functional use of upper extremity   Baseline 52.27   Time 4   Period Weeks   Status New            Plan - 03/31/15 1752    Clinical Impression Statement pt presents with tightness, pain and rigth antecubital cording that is limiting elbow extension. She has limited function and strenth of her arm due to pain. She is needs to achieve increased range of motion to receive radiation treatment  Provided mediium tg soft for arm and she immediately reported some symptomatic relief esepecially at upper arm and was able to straighten her elbow more.    Pt will benefit from skilled  therapeutic intervention in order to improve on the following deficits Decreased range of motion;Increased edema;Impaired UE functional use;Decreased knowledge of precautions;Pain;Decreased strength;Increased fascial restricitons   Rehab Potential Good   Clinical Impairments Affecting Rehab Potential precious chemo   PT Frequency 2x / week   PT Duration 4 weeks   PT Treatment/Interventions Patient/family education;Therapeutic exercise;Taping;ADLs/Self Care Home Management;DME Instruction;Manual techniques;Therapeutic activities;Manual lymph drainage;Compression bandaging;Scar mobilization;Passive range of motion   PT Next Visit Plan Assess use of tg soft.  Manual lymph draiange with stretching to  cording at elbow. A/AA/ PROM cane exercise    Consulted and Agree with Plan of Care Patient         Problem List Patient Active Problem List   Diagnosis Date Noted  . Yeast infection of the skin 01/20/2015  . Heartburn 01/13/2015  . Dehydration 12/30/2014  . Genetic testing 12/15/2014  . Vaginal dryness 12/09/2014  . Family history of breast cancer   . Breast cancer of upper-outer quadrant of right female breast 11/14/2014  . Heart palpitations   . Obesity (BMI 35.0-39.9 without comorbidity)   . Need for pneumococcal vaccine 07/06/2012  . Hyperlipidemia LDL goal <130 07/07/2011  . General medical examination 07/07/2011  . Fibroids 04/01/2011  . History of partial seizures 04/01/2011  . Asthma 04/01/2011  . Mitral valve prolapse 04/01/2011  . Hot flashes 04/01/2011  . Tobacco abuse 04/01/2011  . Essential hypertension 04/01/2011   Donato Heinz. Owens Shark PT   Norwood Levo 03/31/2015, 6:03 PM  [place], [place], [address] Phone: [phone]   Fax:  [phone]

## 2015-03-31 NOTE — Patient Instructions (Signed)
weat tg soft for comfort only, do not let it roll or cause constriction. Remove if uncomfortable

## 2015-04-02 ENCOUNTER — Encounter (HOSPITAL_BASED_OUTPATIENT_CLINIC_OR_DEPARTMENT_OTHER): Payer: Self-pay | Admitting: *Deleted

## 2015-04-02 ENCOUNTER — Ambulatory Visit: Payer: BLUE CROSS/BLUE SHIELD

## 2015-04-02 DIAGNOSIS — M25611 Stiffness of right shoulder, not elsewhere classified: Secondary | ICD-10-CM | POA: Diagnosis not present

## 2015-04-02 DIAGNOSIS — C50911 Malignant neoplasm of unspecified site of right female breast: Secondary | ICD-10-CM

## 2015-04-02 DIAGNOSIS — Z9189 Other specified personal risk factors, not elsewhere classified: Secondary | ICD-10-CM

## 2015-04-02 DIAGNOSIS — M25511 Pain in right shoulder: Secondary | ICD-10-CM

## 2015-04-02 DIAGNOSIS — I890 Lymphedema, not elsewhere classified: Secondary | ICD-10-CM

## 2015-04-02 DIAGNOSIS — R293 Abnormal posture: Secondary | ICD-10-CM

## 2015-04-02 NOTE — Therapy (Signed)
[place], [place], [address] Phone: [phone]   Fax:  [phone]  Physical Therapy Treatment  Patient Details  Name: Katie Jacobson MRN: [id number] Date of Birth: 10/05/1956 Referring Provider:  Rolm Bookbinder, MD  Encounter Date: 04/02/2015      PT End of Session - 04/02/15 1109    Visit Number 2   Number of Visits 9   Date for PT Re-Evaluation 04/30/15   PT Start Time 1026   PT Stop Time 1106   PT Time Calculation (min) 40 min   Activity Tolerance Patient tolerated treatment well;Patient limited by pain  Initially limited by pain but this improved as we continued to stretch.    Behavior During Therapy [facility] for tasks assessed/performed      Past Medical History  Diagnosis Date  . Osteoporosis   . Mitral valve prolapse 1990  . Breast cancer of upper-outer quadrant of right female breast 11/14/2014  . History of seizures     as a child - unknown cause - states was never on anticonvulsants  . History of kidney stones   . Seasonal allergies   . Hypertension     states under control with meds., has been on med. x "years"  . History of chemotherapy     finished chemo 02/03/2015  . Breast cancer, right breast     Past Surgical History  Procedure Laterality Date  . Appendectomy  1976  . Abdominal hysterectomy  1998    partial  . Tonsillectomy  1970s  . Knee arthroscopy Left 03/26/2008  . Tubal ligation  1996  . Salivary stone removal Right 1972  . Colonoscopy    . Portacath placement N/A 11/27/2014    Procedure: INSERTION PORT-A-CATH;  Surgeon: Rolm Bookbinder, MD;  Location: [facility];  Service: General;  Laterality: N/A;  . Plantar's wart excision Left     x 2  . Radioactive seed guided mastectomy with axillary sentinel lymph node biopsy Right 03/02/2015    Procedure: RADIOACTIVE SEED GUIDED RIGHT PARTIAL MASTECTOMY WITH RIGHT AXILLARY SENTINEL LYMPH NODE BIOPSY;   Surgeon: Rolm Bookbinder, MD;  Location: [facility];  Service: General;  Laterality: Right;    There were no vitals filed for this visit.  Visit Diagnosis:  Stiffness of joint, shoulder region, right  Lymphedema  Right shoulder pain  Abnormal posture  At risk for lymphedema  Breast cancer, right breast      Subjective Assessment - 04/02/15 1032    Subjective The sleeve felt good but its a little too big. The sleeve helped with my sensitivity and my pain was better.     Currently in Pain? Yes   Pain Score 5    Pain Location Arm   Pain Orientation Right   Pain Descriptors / Indicators Numbness;Tingling;Other (Comment)  Sensitive   Pain Type Acute pain   Aggravating Factors  Water hitting it in shower, any movement   Pain Relieving Factors The sleeve, limited movement                         [facility] Adult PT Treatment/Exercise - 04/02/15 0001    Manual Therapy   Manual therapy comments Issued one piece size medium TG soft today and pt reported this feeling much better. Pt very tender with therapist donning this at beginning of treament, but much improved with donning after.    Manual Lymphatic Drainage (MLD) In Supine: Short neck,  diaphragmatic breathing, Lt axilla and Rt inguinal nodes, anterior inter-axillary and Rt axillo-inguinal anastomosis, and Rt UE from dorsal hand to lateral shoulder.    Passive ROM In supine to Rt shoulder after manual lymph drainage into flexion and abduction with external rotation for radiation positioning to pts tolerance.                 PT Education - 04/02/15 1107    Education provided Yes   Education Details Demonstrated wall and/or counter top slides to pt for AAROM stretching and to incorporate stretches throughout day with ADLs. Also to try to relax arm and not hold it in guarded position    Person(s) Educated Patient   Methods Explanation;Demonstration   Comprehension Verbalized understanding            Short Term Clinic Goals - 03/31/15 1759    CC Short Term Goal  #1   Title Short term goals = long term goals             Long Term Clinic Goals - 03/31/15 1759    CC Long Term Goal  #1   Title Patient will report a decrease in pain by 50% so they can perform daily activities with greater ease   Time 4   Period Weeks   Status New   CC Long Term Goal  #2   Title Patient will improve shoulder flexion range of motion to 120 degrees to position for radiation treatment   Time 4   Period Weeks   Status New   CC Long Term Goal  #3   Title Patient will have a circumferential reduction of   2        cm at    10  cm above the         ulnar styloid    Baseline 28.7   Time 4   Period Weeks   Status New   CC Long Term Goal  #4   Title Patient will decrease the DASH score to < 35   to demonstrate increased functional use of upper extremity   Baseline 52.27   Time 4   Period Weeks   Status New            Plan - 04/02/15 1110    Clinical Impression Statement Overall pt tlelrated treatment very well even though initially pain limited her PROM, this improved with slow, gentle stretches. She reported feeling better after treatment and her sensitivity was much improved after as well.  By end of treatment I was able to get pt passively into radiaiton position with minimal discomfort.    Pt will benefit from skilled therapeutic intervention in order to improve on the following deficits Decreased range of motion;Increased edema;Impaired UE functional use;Decreased knowledge of precautions;Pain;Decreased strength;Increased fascial restricitons   Rehab Potential Good   Clinical Impairments Affecting Rehab Potential previous chemo; starts radiation beinning of July if can get into position   PT Frequency 2x / week   PT Duration 4 weeks   PT Treatment/Interventions Patient/family education;Therapeutic exercise;Taping;ADLs/Self Care Home Management;DME Instruction;Manual  techniques;Therapeutic activities;Manual lymph drainage;Compression bandaging;Scar mobilization;Passive range of motion   PT Next Visit Plan Continue and teach pt Manual lymph drainage as this also improved her sensitivity; stretching to cording at elbow. A/AA/ PROM cane exercise    PT Home Exercise Plan Wall and/or counter top slides for AAROM   Consulted and Agree with Plan of Care Patient        Problem List  Patient Active Problem List   Diagnosis Date Noted  . Yeast infection of the skin 01/20/2015  . Heartburn 01/13/2015  . Dehydration 12/30/2014  . Genetic testing 12/15/2014  . Vaginal dryness 12/09/2014  . Family history of breast cancer   . Breast cancer of upper-outer quadrant of right female breast 11/14/2014  . Heart palpitations   . Obesity (BMI 35.0-39.9 without comorbidity)   . Need for pneumococcal vaccine 07/06/2012  . Hyperlipidemia LDL goal <130 07/07/2011  . General medical examination 07/07/2011  . Fibroids 04/01/2011  . History of partial seizures 04/01/2011  . Asthma 04/01/2011  . Mitral valve prolapse 04/01/2011  . Hot flashes 04/01/2011  . Tobacco abuse 04/01/2011  . Essential hypertension 04/01/2011    Otelia Limes, PTA 04/02/2015, 11:16 AM  [place], [place], [address] Phone: [phone]   Fax:  [phone]

## 2015-04-03 ENCOUNTER — Ambulatory Visit: Payer: BLUE CROSS/BLUE SHIELD | Admitting: Physical Therapy

## 2015-04-03 DIAGNOSIS — R293 Abnormal posture: Secondary | ICD-10-CM

## 2015-04-03 DIAGNOSIS — M25611 Stiffness of right shoulder, not elsewhere classified: Secondary | ICD-10-CM

## 2015-04-03 DIAGNOSIS — M25511 Pain in right shoulder: Secondary | ICD-10-CM

## 2015-04-03 DIAGNOSIS — I890 Lymphedema, not elsewhere classified: Secondary | ICD-10-CM

## 2015-04-03 DIAGNOSIS — Z9189 Other specified personal risk factors, not elsewhere classified: Secondary | ICD-10-CM

## 2015-04-03 NOTE — Therapy (Signed)
[place], [place], [address] Phone: [phone]   Fax:  [phone]  Physical Therapy Treatment  Patient Details  Name: Katie Jacobson MRN: [id number] Date of Birth: 19-Aug-1956 Referring Provider:  Rolm Bookbinder, MD  Encounter Date: 04/03/2015      PT End of Session - 04/03/15 1201    Visit Number 3   Number of Visits 9   Date for PT Re-Evaluation 04/30/15   PT Start Time 1110   PT Stop Time 1149   PT Time Calculation (min) 39 min      Past Medical History  Diagnosis Date  . Osteoporosis   . Mitral valve prolapse 1990  . Breast cancer of upper-outer quadrant of right female breast 11/14/2014  . History of seizures     as a child - unknown cause - states was never on anticonvulsants  . History of kidney stones   . Seasonal allergies   . Hypertension     states under control with meds., has been on med. x "years"  . History of chemotherapy     finished chemo 02/03/2015  . Breast cancer, right breast     Past Surgical History  Procedure Laterality Date  . Appendectomy  1976  . Abdominal hysterectomy  1998    partial  . Tonsillectomy  1970s  . Knee arthroscopy Left 03/26/2008  . Tubal ligation  1996  . Salivary stone removal Right 1972  . Colonoscopy    . Portacath placement N/A 11/27/2014    Procedure: INSERTION PORT-A-CATH;  Surgeon: Rolm Bookbinder, MD;  Location: [facility];  Service: General;  Laterality: N/A;  . Plantar's wart excision Left     x 2  . Radioactive seed guided mastectomy with axillary sentinel lymph node biopsy Right 03/02/2015    Procedure: RADIOACTIVE SEED GUIDED RIGHT PARTIAL MASTECTOMY WITH RIGHT AXILLARY SENTINEL LYMPH NODE BIOPSY;  Surgeon: Rolm Bookbinder, MD;  Location: [facility];  Service: General;  Laterality: Right;    There were no vitals filed for this visit.  Visit Diagnosis:  Stiffness of joint, shoulder  region, right  Lymphedema  Right shoulder pain  Abnormal posture  At risk for lymphedema      Subjective Assessment - 04/03/15 1117    Subjective having some pain in left elbow area and left thumb....not sure what this from . The smaller sleeves fit better and helped with sleeping last night   Pertinent History Patient diagnosed 11/13/14 with right Triple negative breast cancer with a Ki67 of 20%.  completed chemotherapy  Had umpectomy with 3 (?) nodes removed  on may 23 Goes for CT simulation on june 29 to have port removed on june 27   Currently in Pain? Yes   Pain Score 4    Pain Location Arm   Pain Orientation Right   Pain Radiating Towards into forearm    Pain Relieving Factors the ccompresson                         [facility] Adult PT Treatment/Exercise - 04/03/15 0001    Manual Therapy   Manual therapy comments reapplied tg soft after treatment    Manual Lymphatic Drainage (MLD) In Supine: Short neck, diaphragmatic breathing, Lt axilla and Rt inguinal nodes, anterior inter-axillary and Rt axillo-inguinal anastomosis, and Rt UE from dorsal hand to lateral shoulder.    Passive ROM In supine to Rt shoulder trhoughout manual lymph drainage into  flexion and abduction with external rotation for radiation positioning to pts tolerance. pt insttucted to acheive and maintain this position for a few minute with head rotated to left and right elbvow supported on pillow so she can relax and deep breathe into the stretch                PT Education - 04/03/15 1200    Education provided Yes   Education Details stretch into radiation postition with right elbow on pillow   Person(s) Educated Patient   Methods Explanation;Demonstration   Comprehension Verbalized understanding           Short Term Clinic Goals - 03/31/15 1759    CC Short Term Goal  #1   Title Short term goals = long term goals             Long Term Clinic Goals - 04/03/15 1206    [place]  Term Goal  #1   Title Patient will report a decrease in pain by 50% so they can perform daily activities with greater ease   Status On-going   CC Long Term Goal  #2   Title Patient will improve shoulder flexion range of motion to 120 degrees to position for radiation treatment   Status On-going   CC Long Term Goal  #3   Title Patient will have a circumferential reduction of   2        cm at    10  cm above the         ulnar styloid    Status On-going   CC Long Term Goal  #4   Status On-going            Plan - 04/03/15 1203    Clinical Impression Statement pt continues with pain, especiially with forearm supination. moves into shoulder flexion slowly continues with guarding movement patterns, She felt better after treatment and was able to move better with slight compression from tg soft.    PT Next Visit Plan Continue and teach pt Manual lymph drainage as this also improved her sensitivity; stretching to cording at elbow. A/AA/ PROM cane exercise         Problem List Patient Active Problem List   Diagnosis Date Noted  . Yeast infection of the skin 01/20/2015  . Heartburn 01/13/2015  . Dehydration 12/30/2014  . Genetic testing 12/15/2014  . Vaginal dryness 12/09/2014  . Family history of breast cancer   . Breast cancer of upper-outer quadrant of right female breast 11/14/2014  . Heart palpitations   . Obesity (BMI 35.0-39.9 without comorbidity)   . Need for pneumococcal vaccine 07/06/2012  . Hyperlipidemia LDL goal <130 07/07/2011  . General medical examination 07/07/2011  . Fibroids 04/01/2011  . History of partial seizures 04/01/2011  . Asthma 04/01/2011  . Mitral valve prolapse 04/01/2011  . Hot flashes 04/01/2011  . Tobacco abuse 04/01/2011  . Essential hypertension 04/01/2011   Donato Heinz. Owens Shark, PT  04/03/2015, 12:07 PM  [place], [place], [address] Phone: [phone]   Fax:   [phone]

## 2015-04-06 ENCOUNTER — Encounter (HOSPITAL_BASED_OUTPATIENT_CLINIC_OR_DEPARTMENT_OTHER)
Admission: RE | Disposition: A | Discharge status: Home / Self Care | Payer: Self-pay | Source: Ambulatory Visit | Attending: General Surgery

## 2015-04-06 ENCOUNTER — Ambulatory Visit (HOSPITAL_BASED_OUTPATIENT_CLINIC_OR_DEPARTMENT_OTHER): Payer: BLUE CROSS/BLUE SHIELD | Admitting: Certified Registered"

## 2015-04-06 ENCOUNTER — Encounter (HOSPITAL_BASED_OUTPATIENT_CLINIC_OR_DEPARTMENT_OTHER): Payer: Self-pay | Admitting: *Deleted

## 2015-04-06 ENCOUNTER — Ambulatory Visit (HOSPITAL_BASED_OUTPATIENT_CLINIC_OR_DEPARTMENT_OTHER)
Admission: RE | Admit: 2015-04-06 | Discharge: 2015-04-06 | Disposition: A | Discharge status: Home / Self Care | Payer: BLUE CROSS/BLUE SHIELD | Source: Ambulatory Visit | Attending: General Surgery | Admitting: General Surgery

## 2015-04-06 DIAGNOSIS — I341 Nonrheumatic mitral (valve) prolapse: Secondary | ICD-10-CM | POA: Diagnosis not present

## 2015-04-06 DIAGNOSIS — Z9221 Personal history of antineoplastic chemotherapy: Secondary | ICD-10-CM | POA: Diagnosis not present

## 2015-04-06 DIAGNOSIS — Z853 Personal history of malignant neoplasm of breast: Secondary | ICD-10-CM | POA: Diagnosis not present

## 2015-04-06 DIAGNOSIS — Z452 Encounter for adjustment and management of vascular access device: Secondary | ICD-10-CM | POA: Diagnosis not present

## 2015-04-06 DIAGNOSIS — J45909 Unspecified asthma, uncomplicated: Secondary | ICD-10-CM | POA: Insufficient documentation

## 2015-04-06 DIAGNOSIS — I10 Essential (primary) hypertension: Secondary | ICD-10-CM | POA: Insufficient documentation

## 2015-04-06 DIAGNOSIS — M810 Age-related osteoporosis without current pathological fracture: Secondary | ICD-10-CM | POA: Diagnosis not present

## 2015-04-06 DIAGNOSIS — Z87442 Personal history of urinary calculi: Secondary | ICD-10-CM | POA: Diagnosis not present

## 2015-04-06 DIAGNOSIS — F17210 Nicotine dependence, cigarettes, uncomplicated: Secondary | ICD-10-CM | POA: Diagnosis not present

## 2015-04-06 DIAGNOSIS — Z882 Allergy status to sulfonamides status: Secondary | ICD-10-CM | POA: Diagnosis not present

## 2015-04-06 HISTORY — PX: PORT-A-CATH REMOVAL: SHX5289

## 2015-04-06 SURGERY — REMOVAL PORT-A-CATH
Anesthesia: Monitor Anesthesia Care | Site: Chest | Laterality: Left

## 2015-04-06 MED ORDER — MIDAZOLAM HCL 2 MG/2ML IJ SOLN
1.0000 mg | INTRAMUSCULAR | Status: DC | PRN
  Administered 2015-04-06: 2 mg via INTRAVENOUS

## 2015-04-06 MED ORDER — GLYCOPYRROLATE 0.2 MG/ML IJ SOLN
0.2000 mg | Freq: Once | INTRAMUSCULAR | Status: DC | PRN
Start: 2015-04-06 — End: 2015-04-06

## 2015-04-06 MED ORDER — SCOPOLAMINE 1 MG/3DAYS TD PT72: 1.0000 | MEDICATED_PATCH | Freq: Once | TRANSDERMAL | Status: DC | PRN

## 2015-04-06 MED ORDER — FENTANYL CITRATE (PF) 100 MCG/2ML IJ SOLN
INTRAMUSCULAR | Status: AC
  Filled 2015-04-06: qty 6

## 2015-04-06 MED ORDER — LACTATED RINGERS IV SOLN
INTRAVENOUS | Status: DC
Start: 2015-04-06 — End: 2015-04-06
  Administered 2015-04-06: 09:00:00 via INTRAVENOUS

## 2015-04-06 MED ORDER — OXYCODONE HCL 5 MG PO TABS
ORAL_TABLET | ORAL | Status: AC
  Filled 2015-04-06: qty 1

## 2015-04-06 MED ORDER — LIDOCAINE-EPINEPHRINE (PF) 1 %-1:200000 IJ SOLN
INTRAMUSCULAR | Status: DC | PRN
  Administered 2015-04-06: 10 mL

## 2015-04-06 MED ORDER — OXYCODONE HCL 5 MG PO TABS
5.0000 mg | ORAL_TABLET | Freq: Once | ORAL | Status: AC | PRN
  Administered 2015-04-06: 5 mg via ORAL

## 2015-04-06 MED ORDER — LIDOCAINE HCL (CARDIAC) 20 MG/ML IV SOLN
INTRAVENOUS | Status: DC | PRN
  Administered 2015-04-06: 60 mg via INTRAVENOUS

## 2015-04-06 MED ORDER — FENTANYL CITRATE (PF) 100 MCG/2ML IJ SOLN
50.0000 ug | INTRAMUSCULAR | Status: DC | PRN
  Administered 2015-04-06: 50 ug via INTRAVENOUS

## 2015-04-06 MED ORDER — ONDANSETRON HCL 4 MG/2ML IJ SOLN
INTRAMUSCULAR | Status: DC | PRN
  Administered 2015-04-06: 4 mg via INTRAVENOUS

## 2015-04-06 MED ORDER — HYDROMORPHONE HCL 1 MG/ML IJ SOLN: 0.2500 mg | INTRAMUSCULAR | Status: DC | PRN

## 2015-04-06 MED ORDER — MIDAZOLAM HCL 2 MG/2ML IJ SOLN
INTRAMUSCULAR | Status: AC
  Filled 2015-04-06: qty 2

## 2015-04-06 MED ORDER — OXYCODONE HCL 5 MG/5ML PO SOLN: 5.0000 mg | Freq: Once | ORAL | Status: AC | PRN

## 2015-04-06 MED ORDER — PROMETHAZINE HCL 25 MG/ML IJ SOLN: 6.2500 mg | INTRAMUSCULAR | Status: DC | PRN

## 2015-04-06 MED ORDER — PROPOFOL INFUSION 10 MG/ML OPTIME
INTRAVENOUS | Status: DC | PRN
  Administered 2015-04-06: 100 ug/kg/min via INTRAVENOUS

## 2015-04-06 MED ORDER — LIDOCAINE-EPINEPHRINE (PF) 1 %-1:200000 IJ SOLN
INTRAMUSCULAR | Status: AC
  Filled 2015-04-06: qty 10

## 2015-04-06 SURGICAL SUPPLY — 29 items
BLADE SURG 15 STRL LF DISP TIS (BLADE) ×1 IMPLANT
BLADE SURG 15 STRL SS (BLADE) ×1
CHLORAPREP W/TINT 26ML (MISCELLANEOUS) ×2 IMPLANT
COVER BACK TABLE 60X90IN (DRAPES) ×2 IMPLANT
COVER MAYO STAND STRL (DRAPES) ×2 IMPLANT
DECANTER SPIKE VIAL GLASS SM (MISCELLANEOUS) IMPLANT
DRAPE LAPAROTOMY 100X72 PEDS (DRAPES) ×2 IMPLANT
ELECT COATED BLADE 2.86 ST (ELECTRODE) ×2 IMPLANT
ELECT REM PT RETURN 9FT ADLT (ELECTROSURGICAL) ×2
ELECTRODE REM PT RTRN 9FT ADLT (ELECTROSURGICAL) ×1 IMPLANT
GLOVE BIO SURGEON STRL SZ7 (GLOVE) ×4 IMPLANT
GLOVE BIOGEL PI IND STRL 7.5 (GLOVE) ×1 IMPLANT
GLOVE BIOGEL PI INDICATOR 7.5 (GLOVE) ×1
GLOVE EXAM NITRILE EXT CUFF MD (GLOVE) ×2 IMPLANT
GOWN STRL REUS W/ TWL LRG LVL3 (GOWN DISPOSABLE) ×2 IMPLANT
GOWN STRL REUS W/TWL LRG LVL3 (GOWN DISPOSABLE) ×2
LIQUID BAND (GAUZE/BANDAGES/DRESSINGS) ×2 IMPLANT
MARKER SKIN DUAL TIP RULER LAB (MISCELLANEOUS) IMPLANT
NEEDLE HYPO 25X1 1.5 SAFETY (NEEDLE) ×2 IMPLANT
PACK BASIN DAY SURGERY FS (CUSTOM PROCEDURE TRAY) ×2 IMPLANT
PENCIL BUTTON HOLSTER BLD 10FT (ELECTRODE) ×2 IMPLANT
SLEEVE SCD COMPRESS KNEE MED (MISCELLANEOUS) IMPLANT
SUT MNCRL AB 4-0 PS2 18 (SUTURE) IMPLANT
SUT MON AB 4-0 PC3 18 (SUTURE) ×2 IMPLANT
SUT VIC AB 3-0 SH 27 (SUTURE) ×1
SUT VIC AB 3-0 SH 27X BRD (SUTURE) ×1 IMPLANT
SYR CONTROL 10ML LL (SYRINGE) ×2 IMPLANT
TOWEL OR 17X24 6PK STRL BLUE (TOWEL DISPOSABLE) ×2 IMPLANT
TOWEL OR NON WOVEN STRL DISP B (DISPOSABLE) IMPLANT

## 2015-04-06 NOTE — H&P (Signed)
Katie Jacobson is an 59 y.o. female.   Chief Complaint: breast cancer HPI:  32 yof s/p right lumpectomy/sn biopsy after primary chemotherapy that path shows residual 1 cm cancer with ultimately negative margins after excising additional tissue and negative nodes. she is doing well and is without complaint today.    Past Medical History  Diagnosis Date  . Osteoporosis   . Mitral valve prolapse 1990  . Breast cancer of upper-outer quadrant of right female breast 11/14/2014  . History of seizures     as a child - unknown cause - states was never on anticonvulsants  . History of kidney stones   . Seasonal allergies   . Hypertension     states under control with meds., has been on med. x "years"  . History of chemotherapy     finished chemo 02/03/2015  . Breast cancer, right breast     Past Surgical History  Procedure Laterality Date  . Appendectomy  1976  . Abdominal hysterectomy  1998    partial  . Tonsillectomy  1970s  . Knee arthroscopy Left 03/26/2008  . Tubal ligation  1996  . Salivary stone removal Right 1972  . Colonoscopy    . Portacath placement N/A 11/27/2014    Procedure: INSERTION PORT-A-CATH;  Surgeon: Rolm Bookbinder, MD;  Location: [facility];  Service: General;  Laterality: N/A;  . Plantar's wart excision Left     x 2  . Radioactive seed guided mastectomy with axillary sentinel lymph node biopsy Right 03/02/2015    Procedure: RADIOACTIVE SEED GUIDED RIGHT PARTIAL MASTECTOMY WITH RIGHT AXILLARY SENTINEL LYMPH NODE BIOPSY;  Surgeon: Rolm Bookbinder, MD;  Location: [facility];  Service: General;  Laterality: Right;    Family History  Problem Relation Age of Onset  . Heart disease Mother   . Stroke Mother   . Hypertension Mother   . Diabetes Mother   . Diabetes Sister   . Kidney disease Brother   . Mental retardation Brother   . Breast cancer Cousin     deceased 32  . Diabetes Sister   . Heart attack Father    Social  History:  reports that she has been smoking Cigarettes.  She has a 21.5 pack-year smoking history. She has never used smokeless tobacco. She reports that she does not drink alcohol or use illicit drugs.  Allergies:  Allergies  Allergen Reactions  . Sulfa Antibiotics Other (See Comments)    UNKNOWN    Medications Prior to Admission  Medication Sig Dispense Refill  . chlorthalidone (HYGROTON) 25 MG tablet Take 1 tablet (25 mg total) by mouth every other day. 30 tablet 3  . enalapril (VASOTEC) 20 MG tablet Take 1 tablet (20 mg total) by mouth at bedtime. 30 tablet 11  . fluticasone (FLONASE) 50 MCG/ACT nasal spray Place 1 spray into both nostrils as needed.    . metoprolol succinate (TOPROL-XL) 50 MG 24 hr tablet Take 1 tablet (50 mg total) by mouth at bedtime. 30 tablet 11  . albuterol (PROVENTIL HFA;VENTOLIN HFA) 108 (90 BASE) MCG/ACT inhaler Inhale into the lungs every 6 (six) hours as needed for wheezing or shortness of breath.    . dexamethasone (DECADRON) 4 MG tablet TAKE 2 TABLETS BY MOUTH TWICE A DAY *START THE DAY BEFORE TAXOTERE THEN AGAIN THE DAY AFTER CHEM X3  1  . gabapentin (NEURONTIN) 300 MG capsule Take 1 capsule (300 mg total) by mouth at bedtime. 30 capsule 2  . nystatin-triamcinolone (MYCOLOG  II) cream APPLY 1 APPLICATION TOPICALLY 2 (TWO) TIMES DAILY.  2  . oxyCODONE-acetaminophen (PERCOCET) 10-325 MG per tablet Take 1 tablet by mouth every 6 (six) hours as needed. for pain  0    No results found for this or any previous visit (from the past 48 hour(s)). No results found.  ROS negative Blood pressure 124/63, pulse 78, temperature 98.6 F (37 C), temperature source Oral, resp. rate 18, height 5\' 1"  (1.549 m), weight 96.843 kg (213 lb 8 oz), last menstrual period 10/10/1996, SpO2 100 %. Physical Exam  Vitals (Sonya Bynum CMA; 03/19/2015 3:25 PM) 03/19/2015 3:24 PM Weight: 211.8 lb Height: 62in Body Surface Area: 2.05 m Body Mass Index: 38.74 kg/m Temp.:  98.39F(Temporal)  Pulse: 104 (Regular)  BP: 126/80 (Sitting, Left Arm, Standard)    Physical Exam Rolm Bookbinder MD; 03/19/2015 4:06 PM) Breast Note: healing right breast and axillary incisions without infection cv rrr Lungs clear bilaterally  Assessment/Plan  Assessment & Plan Rolm Bookbinder MD; 03/19/2015 4:06 PM) STAGE I BREAST CANCER, RIGHT (174.9  C50.911) Story: will plan port removal soon per her request, I will see back in six months for follow up. pt referral.    Furious Chiarelli 04/06/2015, 9:19 AM

## 2015-04-06 NOTE — Interval H&P Note (Signed)
History and Physical Interval Note:  04/06/2015 9:20 AM  Katie Jacobson  has presented today for surgery, with the diagnosis of breast cancer  The various methods of treatment have been discussed with the patient and family. After consideration of risks, benefits and other options for treatment, the patient has consented to  Procedure(s): REMOVAL PORT-A-CATH (N/A) as a surgical intervention .  The patient's history has been reviewed, patient examined, no change in status, stable for surgery.  I have reviewed the patient's chart and labs.  Questions were answered to the patient's satisfaction.     Marquest Gunkel

## 2015-04-06 NOTE — Transfer of Care (Signed)
Immediate Anesthesia Transfer of Care Note  Patient: [name]  Procedure(s) Performed: Procedure(s): REMOVAL PORT-A-CATH (Left)  Patient Location: PACU  Anesthesia Type:MAC  Level of Consciousness: awake, alert , oriented and patient cooperative  Airway & Oxygen Therapy: Patient Spontanous Breathing and Patient connected to face mask oxygen  Post-op Assessment: Report given to RN and Post -op Vital signs reviewed and stable  Post vital signs: Reviewed and stable  Last Vitals:  Filed Vitals:   04/06/15 0911  BP: 124/63  Pulse: 78  Temp: 37 C  Resp: 18    Complications: No apparent anesthesia complications

## 2015-04-06 NOTE — Anesthesia Procedure Notes (Signed)
Procedure Name: MAC Date/Time: 04/06/2015 9:40 AM Performed by: Quintan Saldivar D Pre-anesthesia Checklist: Patient identified, Emergency Drugs available, Suction available, Patient being monitored and Timeout performed Patient Re-evaluated:Patient Re-evaluated prior to inductionOxygen Delivery Method: Simple face mask Preoxygenation: Pre-oxygenation with 100% oxygen

## 2015-04-06 NOTE — Anesthesia Preprocedure Evaluation (Addendum)
Anesthesia Evaluation  Patient identified by MRN, date of birth, ID band Patient awake    Reviewed: Allergy & Precautions, NPO status , Patient's Chart, lab work & pertinent test results  Airway Mallampati: II   Neck ROM: Full    Dental   Pulmonary asthma , Current Smoker,  breath sounds clear to auscultation        Cardiovascular hypertension, Rhythm:Regular Rate:Normal     Neuro/Psych    GI/Hepatic negative GI ROS, Neg liver ROS,   Endo/Other  negative endocrine ROS  Renal/GU negative Renal ROS     Musculoskeletal   Abdominal   Peds  Hematology   Anesthesia Other Findings   Reproductive/Obstetrics                           Anesthesia Physical Anesthesia Plan  ASA: III  Anesthesia Plan: MAC   Post-op Pain Management:    Induction: Intravenous  Airway Management Planned: Simple Face Mask  Additional Equipment:   Intra-op Plan:   Post-operative Plan:   Informed Consent: I have reviewed the patients History and Physical, chart, labs and discussed the procedure including the risks, benefits and alternatives for the proposed anesthesia with the patient or authorized representative who has indicated his/her understanding and acceptance.   Dental advisory given  Plan Discussed with: CRNA and Anesthesiologist  Anesthesia Plan Comments:         Anesthesia Quick Evaluation

## 2015-04-06 NOTE — Op Note (Signed)
Preoperative diagnosis: breast cancer no longer needs venous access Postoperative diagnosis: Same as above Procedure: Port removal Surgeon: Dr. Serita Grammes Anesthesia: local mac EBL minimal Complications none Drains none Specimens none Sponge count correct Dispo to pacu stable  Indications: This is a 73 yof completed treatment for breast cancer. She no longer needs her port and would like it removed.   Procedure: After informed consent obtained patient taken to the OR. She was placed under monitored anesthesia care.  She was prepped and draped in the standard sterile surgical fashion. Surgical timeout was performed.  I anesthetized the area with quarter percent marcaine and 1% lidocaine.  I then reentered her old incision. I then removed the port in its entirety.  Hemostasis was observed. I then closed with 3-0 vicryl and 4-0 monocryl.   I applied glue to the incision. A binder was placed. She was transferred to recovery stable.

## 2015-04-06 NOTE — Discharge Instructions (Signed)
PORT-A-CATH: POST OP INSTRUCTIONS  Always review your discharge instruction sheet given to you by the facility where your surgery was performed.   1. A prescription for pain medication may be given to you upon discharge. Take your pain medication as prescribed, if needed. If narcotic pain medicine is not needed, then you make take acetaminophen (Tylenol) or ibuprofen (Advil) as needed.  2. Take your usually prescribed medications unless otherwise directed. 3. If you need a refill on your pain medication, please contact our office. All narcotic pain medicine now requires a paper prescription.  Phoned in and fax refills are no longer allowed by law.  Prescriptions will not be filled after 5 pm or on weekends.  4. You should follow a light diet for the remainder of the day after your procedure. 5. Most patients will experience some mild swelling and/or bruising in the area of the incision. It may take several days to resolve. 6. It is common to experience some constipation if taking pain medication after surgery. Increasing fluid intake and taking a stool softener (such as Colace) will usually help or prevent this problem from occurring. A mild laxative (Milk of Magnesia or Miralax) should be taken according to package directions if there are no bowel movements after 48 hours.  7. Unless discharge instructions indicate otherwise, you may remove your bandages 48 hours after surgery, and you may shower at that time. You may have steri-strips (small white skin tapes) in place directly over the incision.  These strips should be left on the skin for 7-10 days.  If your surgeon used Dermabond (skin glue) on the incision, you may shower in 24 hours.  The glue will flake off over the next 2-3 weeks.  8. If your port is left accessed at the end of surgery (needle left in port), the dressing cannot get wet and should only by changed by a healthcare professional. When the port is no longer accessed (when the  needle has been removed), follow step 7.   9. ACTIVITIES:  Limit activity involving your arms for the next 72 hours. Do no strenuous exercise or activity for 1 week. You may drive when you are no longer taking prescription pain medication, you can comfortably wear a seatbelt, and you can maneuver your car. 10.You may need to see your doctor in the office for a follow-up appointment.  Please       check with your doctor.   WHEN TO CALL YOUR DOCTOR [phone]): 1. Fever over 101.0 2. Chills 3. Continued bleeding from incision 4. Increased redness and tenderness at the site 5. Shortness of breath, difficulty breathing   The clinic staff is available to answer your questions during regular business hours. Please dont hesitate to call and ask to speak to one of the nurses or medical assistants for clinical concerns. If you have a medical emergency, go to the nearest emergency room or call 911.  A surgeon from [facility] is always on call at the hospital.     For further information, please visit [email or web address]     Post Anesthesia Home Care Instructions  Activity: Get plenty of rest for the remainder of the day. A responsible adult should stay with you for 24 hours following the procedure.  For the next 24 hours, DO NOT: -Drive a car -Paediatric nurse -Drink alcoholic beverages -Take any medication unless instructed by your physician -Make any legal decisions or sign important papers.  Meals: Start with liquid foods such as  gelatin or soup. Progress to regular foods as tolerated. Avoid greasy, spicy, heavy foods. If nausea and/or vomiting occur, drink only clear liquids until the nausea and/or vomiting subsides. Call your physician if vomiting continues.  Special Instructions/Symptoms: Your throat may feel dry or sore from the anesthesia or the breathing tube placed in your throat during surgery. If this causes discomfort, gargle with warm salt water.  The discomfort should disappear within 24 hours.  If you had a scopolamine patch placed behind your ear for the management of post- operative nausea and/or vomiting:  1. The medication in the patch is effective for 72 hours, after which it should be removed.  Wrap patch in a tissue and discard in the trash. Wash hands thoroughly with soap and water. 2. You may remove the patch earlier than 72 hours if you experience unpleasant side effects which may include dry mouth, dizziness or visual disturbances. 3. Avoid touching the patch. Wash your hands with soap and water after contact with the patch.

## 2015-04-07 ENCOUNTER — Encounter (HOSPITAL_BASED_OUTPATIENT_CLINIC_OR_DEPARTMENT_OTHER): Payer: Self-pay | Admitting: General Surgery

## 2015-04-07 ENCOUNTER — Ambulatory Visit: Payer: BLUE CROSS/BLUE SHIELD

## 2015-04-07 DIAGNOSIS — M25611 Stiffness of right shoulder, not elsewhere classified: Secondary | ICD-10-CM | POA: Diagnosis not present

## 2015-04-07 DIAGNOSIS — M25511 Pain in right shoulder: Secondary | ICD-10-CM

## 2015-04-07 DIAGNOSIS — Z9189 Other specified personal risk factors, not elsewhere classified: Secondary | ICD-10-CM

## 2015-04-07 DIAGNOSIS — C50911 Malignant neoplasm of unspecified site of right female breast: Secondary | ICD-10-CM

## 2015-04-07 DIAGNOSIS — R293 Abnormal posture: Secondary | ICD-10-CM

## 2015-04-07 DIAGNOSIS — I890 Lymphedema, not elsewhere classified: Secondary | ICD-10-CM

## 2015-04-07 NOTE — Patient Instructions (Signed)

## 2015-04-07 NOTE — Therapy (Signed)
[place], [place], [address] Phone: [phone]   Fax:  [phone]  Physical Therapy Treatment  Patient Details  Name: Katie Jacobson MRN: [id number] Date of Birth: [birth year] Referring Provider:  Rolm Bookbinder, MD  Encounter Date: 04/07/2015      PT End of Session - 04/07/15 1201    Visit Number 4   Number of Visits 9   Date for PT Re-Evaluation 04/30/15   PT Start Time 1031   PT Stop Time 1104   PT Time Calculation (min) 33 min   Activity Tolerance Patient tolerated treatment well   Behavior During Therapy [facility] for tasks assessed/performed      Past Medical History  Diagnosis Date  . Osteoporosis   . Mitral valve prolapse 1990  . Breast cancer of upper-outer quadrant of right female breast 11/14/2014  . History of seizures     as a child - unknown cause - states was never on anticonvulsants  . History of kidney stones   . Seasonal allergies   . Hypertension     states under control with meds., has been on med. x "years"  . History of chemotherapy     finished chemo 02/03/2015  . Breast cancer, right breast     Past Surgical History  Procedure Laterality Date  . Appendectomy  1976  . Abdominal hysterectomy  1998    partial  . Tonsillectomy  1970s  . Knee arthroscopy Left 03/26/2008  . Tubal ligation  1996  . Salivary stone removal Right 1972  . Colonoscopy    . Portacath placement N/A 11/27/2014    Procedure: INSERTION PORT-A-CATH;  Surgeon: Rolm Bookbinder, MD;  Location: [place];  Service: General;  Laterality: N/A;  . Plantar's wart excision Left     x 2  . Radioactive seed guided mastectomy with axillary sentinel lymph node biopsy Right 03/02/2015    Procedure: RADIOACTIVE SEED GUIDED RIGHT PARTIAL MASTECTOMY WITH RIGHT AXILLARY SENTINEL LYMPH NODE BIOPSY;  Surgeon: Rolm Bookbinder, MD;  Location: [place];  Service: General;   Laterality: Right;    There were no vitals filed for this visit.  Visit Diagnosis:  Stiffness of joint, shoulder region, right  Lymphedema  Right shoulder pain  Abnormal posture  At risk for lymphedema  Breast cancer, right breast      Subjective Assessment - 04/07/15 1036    Subjective Had port removed yesterday so having alot of pain from that. Almost cancelled today because its hurting so much, thats why I was running late. My Rt shoulder and arm just feels achy today.    Currently in Pain? Yes   Pain Score 6    Pain Location Chest  Port removal site   Pain Orientation Right   Pain Descriptors / Indicators Sore   Pain Type Acute pain   Pain Onset Yesterday   Aggravating Factors  having port removed                         [facility] Adult PT Treatment/Exercise - 04/07/15 0001    Manual Therapy   Manual therapy comments reapplied tg soft after treatment    Manual Lymphatic Drainage (MLD) In Supine: Short neck, diaphragmatic breathing, Lt axilla and Rt inguinal nodes, anterior inter-axillary and Rt axillo-inguinal anastomosis, and Rt UE from dorsal hand to lateral shoulder instructing pt in this throughout today and handout issued.  PT Education - 04/07/15 1200    Education provided Yes   Education Details Self manual lymph drainage and answered pts questions regarding some of the exercises issued her from the doctor after surgery.    Person(s) Educated Patient   Methods Explanation;Demonstration;Handout   Comprehension Verbalized understanding;Returned demonstration;Need further instruction;Tactile cues required           Short Term Clinic Goals - 03/31/15 1759    CC Short Term Goal  #1   Title Short term goals = long term goals             Long Term Clinic Goals - 04/03/15 1206    CC Long Term Goal  #1   Title Patient will report a decrease in pain by 50% so they can perform daily activities with greater ease    Status On-going   CC Long Term Goal  #2   Title Patient will improve shoulder flexion range of motion to 120 degrees to position for radiation treatment   Status On-going   CC Long Term Goal  #3   Title Patient will have a circumferential reduction of   2        cm at    10  cm above the         ulnar styloid    Status On-going   CC Long Term Goal  #4   Status On-going            Plan - 04/07/15 1203    Clinical Impression Statement Pt arrived late today due to having alot of soreness at port removal site done yesterday. Pt demonstrated good initial understanding of self manual lymph drainage and will continue with her HEP stretches issued from doctor after surgery for now. Did not have time to progress HEP further today.    Pt will benefit from skilled therapeutic intervention in order to improve on the following deficits Decreased range of motion;Increased edema;Impaired UE functional use;Decreased knowledge of precautions;Pain;Decreased strength;Increased fascial restricitons   Rehab Potential Good   Clinical Impairments Affecting Rehab Potential previous chemo; starts radiation beinning of July if can get into position   PT Frequency 2x / week   PT Duration 4 weeks   PT Treatment/Interventions Patient/family education;Therapeutic exercise;Taping;ADLs/Self Care Home Management;DME Instruction;Manual techniques;Therapeutic activities;Manual lymph drainage;Compression bandaging;Scar mobilization;Passive range of motion   PT Next Visit Plan Measure pts circumference as she felt her arm was swollen today, but did not have time to measure. Continue and review Manual lymph drainage as this also improved her sensitivity; stretching to cording at elbow. A/AA/ PROM cane exercise, progress HEP prn.    Consulted and Agree with Plan of Care Patient        Problem List Patient Active Problem List   Diagnosis Date Noted  . Yeast infection of the skin 01/20/2015  . Heartburn 01/13/2015  .  Dehydration 12/30/2014  . Genetic testing 12/15/2014  . Vaginal dryness 12/09/2014  . Family history of breast cancer   . Breast cancer of upper-outer quadrant of right female breast 11/14/2014  . Heart palpitations   . Obesity (BMI 35.0-39.9 without comorbidity)   . Need for pneumococcal vaccine 07/06/2012  . Hyperlipidemia LDL goal <130 07/07/2011  . General medical examination 07/07/2011  . Fibroids 04/01/2011  . History of partial seizures 04/01/2011  . Asthma 04/01/2011  . Mitral valve prolapse 04/01/2011  . Hot flashes 04/01/2011  . Tobacco abuse 04/01/2011  . Essential hypertension 04/01/2011    Otelia Limes, PTA  04/07/2015, 12:08 PM  [facility] [place], [place], [address] Phone: [phone]   Fax:  [phone]

## 2015-04-08 ENCOUNTER — Ambulatory Visit
Admission: RE | Admit: 2015-04-08 | Discharge: 2015-04-08 | Disposition: A | Discharge status: Home / Self Care | Payer: BLUE CROSS/BLUE SHIELD | Source: Ambulatory Visit | Attending: Radiation Oncology | Admitting: Radiation Oncology

## 2015-04-08 DIAGNOSIS — Z510 Encounter for antineoplastic radiation therapy: Secondary | ICD-10-CM | POA: Diagnosis not present

## 2015-04-08 DIAGNOSIS — C50411 Malignant neoplasm of upper-outer quadrant of right female breast: Secondary | ICD-10-CM

## 2015-04-08 NOTE — Anesthesia Postprocedure Evaluation (Signed)
  Anesthesia Post-op Note  Patient: Katie Jacobson  Procedure(s) Performed: Procedure(s): REMOVAL PORT-A-CATH (Left)  Patient Location: PACU  Anesthesia Type:General  Level of Consciousness: awake  Airway and Oxygen Therapy: Patient Spontanous Breathing  Post-op Pain: mild  Post-op Assessment: Post-op Vital signs reviewed              Post-op Vital Signs: Reviewed  Last Vitals:  Filed Vitals:   04/06/15 1045  BP: 127/62  Pulse: 74  Temp: 36.5 C  Resp: 14    Complications: No apparent anesthesia complications

## 2015-04-08 NOTE — Progress Notes (Signed)
SIMULATION AND TREATMENT PLANNING NOTE  Outpatient  DIAGNOSIS:      ICD-9-CM ICD-10-CM   1. Breast cancer of upper-outer quadrant of right female breast 174.4 C50.411      NARRATIVE:  The patient was brought to the [facility].  Identity was confirmed.  All relevant records and images related to the planned course of therapy were reviewed.  The patient freely provided informed written consent to proceed with treatment after reviewing the details related to the planned course of therapy. The consent form was witnessed and verified by the simulation staff.    Then, the patient was set-up in a stable reproducible  supine position for radiation therapy - arm (right) and head in vaclock.  CT images were obtained.  Surface markings were placed.  The CT images were loaded into the planning software.      TREATMENT PLANNING NOTE: Treatment planning then occurred.  The radiation prescription was entered and confirmed.    A total of 3 medically necessary complex treatment devices were fabricated and supervised by me - 2 tangential fields with MLCs to block lung/heart, and vaclock device. I have requested : 3D Simulation.  I have requested a DVH of the following structures: lungs, heart, lumpectomy cavity.     The patient will receive 50 Gy in 25 fraction to the right breast; this will be followed by a boost to the lumpectomy cavity of 10 Gy in 5 fractions.  Optical Surface Tracking Plan:  Since intensity modulated radiotherapy (IMRT) and 3D conformal radiation treatment methods are predicated on accurate and precise positioning for treatment, intrafraction motion monitoring is medically necessary to ensure accurate and safe treatment delivery. The ability to quantify intrafraction motion without excessive ionizing radiation dose can only be performed with optical surface tracking. Accordingly, surface imaging offers the opportunity to obtain 3D measurements of patient position  throughout IMRT and 3D treatments without excessive radiation exposure. I am ordering optical surface tracking for this patient's upcoming course of radiotherapy.  ________________________________   Reference:  Ursula Alert, J, et al. Surface imaging-based analysis of intrafraction motion for breast radiotherapy patients.Journal of [place], n. 6, nov. 2014. ISSN [id number].  Available at: <http://www.jacmp.org/index.php/jacmp/article/view/4957>.   . This document serves as a record of services personally performed by Eppie Gibson, MD. It was created on her behalf by Arlyce Harman, a trained medical scribe. The creation of this record is based on the scribe's personal observations and the provider's statements to them. This document has been checked and approved by the attending provider. -----------------------------------  Eppie Gibson, MD

## 2015-04-10 ENCOUNTER — Ambulatory Visit (HOSPITAL_COMMUNITY)
Admission: RE | Admit: 2015-04-10 | Discharge: 2015-04-10 | Disposition: A | Discharge status: Home / Self Care | Payer: BLUE CROSS/BLUE SHIELD | Source: Ambulatory Visit | Attending: Family | Admitting: Family

## 2015-04-10 ENCOUNTER — Ambulatory Visit: Payer: BLUE CROSS/BLUE SHIELD | Attending: General Surgery | Admitting: Physical Therapy

## 2015-04-10 ENCOUNTER — Telehealth: Payer: Self-pay | Admitting: *Deleted

## 2015-04-10 ENCOUNTER — Other Ambulatory Visit: Payer: Self-pay | Admitting: *Deleted

## 2015-04-10 DIAGNOSIS — Z510 Encounter for antineoplastic radiation therapy: Secondary | ICD-10-CM | POA: Diagnosis not present

## 2015-04-10 DIAGNOSIS — I82701 Chronic embolism and thrombosis of unspecified veins of right upper extremity: Secondary | ICD-10-CM | POA: Diagnosis not present

## 2015-04-10 DIAGNOSIS — M25511 Pain in right shoulder: Secondary | ICD-10-CM | POA: Diagnosis present

## 2015-04-10 DIAGNOSIS — M25611 Stiffness of right shoulder, not elsewhere classified: Secondary | ICD-10-CM | POA: Diagnosis not present

## 2015-04-10 DIAGNOSIS — C50911 Malignant neoplasm of unspecified site of right female breast: Secondary | ICD-10-CM | POA: Diagnosis present

## 2015-04-10 DIAGNOSIS — I890 Lymphedema, not elsewhere classified: Secondary | ICD-10-CM | POA: Diagnosis present

## 2015-04-10 DIAGNOSIS — R293 Abnormal posture: Secondary | ICD-10-CM | POA: Insufficient documentation

## 2015-04-10 DIAGNOSIS — M79601 Pain in right arm: Secondary | ICD-10-CM | POA: Diagnosis present

## 2015-04-10 DIAGNOSIS — Z9189 Other specified personal risk factors, not elsewhere classified: Secondary | ICD-10-CM | POA: Diagnosis present

## 2015-04-10 DIAGNOSIS — C50411 Malignant neoplasm of upper-outer quadrant of right female breast: Secondary | ICD-10-CM | POA: Diagnosis not present

## 2015-04-10 NOTE — Telephone Encounter (Signed)
Patient called stating that she was seen by her physical therapist today. Patient states that therapist found "nodules" in left arm and it could be related to a blood clot. Patient denies any heat, unusual swelling, or pain to left arm. Informed patient that if she starts to having any of these symptoms, go to the emergency department. Patient verbalized understanding. Message sent to RN Val for notification.

## 2015-04-10 NOTE — Progress Notes (Signed)
*  Preliminary Results* Right upper extremity venous duplex completed. Right upper extremity is negative for deep vein thrombosis. There is evidence of superficial vein thrombosis involving a branch of the right cephalic vein in the right forearm.  Attempted to call report to Dr. Virgie Dad nurse and the cancer center triage nurse, both with no answer. Preliminary results discussed with Dr. Marin Olp, the on call physician. He has instructed the patient to go home and take two 81mg  baby aspirin per day.   04/10/2015 2:43 PM  Maudry Mayhew, RVT, RDCS, RDMS

## 2015-04-10 NOTE — Telephone Encounter (Signed)
Per review of concern per PT- MD recommended to proceed to US/doppler for evaluation and concern for possible DVT.  Obtained an appointment for today and spoke with pt.  Doppler will be performed at [facility] with call report.  Pt aware of all the above and will get there ASAP due to currently at home in [facility].

## 2015-04-10 NOTE — Therapy (Signed)
[facility] [address] [place], [place], [address] Phone: [phone]   Fax:  [phone]  Physical Therapy Treatment  Patient Details  Name: Katie Jacobson MRN: [id number] Date of Birth: 01/06/1956 Referring Provider:  Wakefield, Matthew, MD  Encounter Date: 04/10/2015      PT End of Session - 04/10/15 0858    Visit Number 5   Number of Visits 9   Date for PT Re-Evaluation 04/30/15   PT Start Time 0815   PT Stop Time 0845   PT Time Calculation (min) 30 min   Activity Tolerance Patient tolerated treatment well   Behavior During Therapy WFL for tasks assessed/performed      Past Medical History  Diagnosis Date  . Osteoporosis   . Mitral valve prolapse 1990  . Breast cancer of upper-outer quadrant of right female breast 11/14/2014  . History of seizures     as a child - unknown cause - states was never on anticonvulsants  . History of kidney stones   . Seasonal allergies   . Hypertension     states under control with meds., has been on med. x "years"  . History of chemotherapy     finished chemo 02/03/2015  . Breast cancer, right breast     Past Surgical History  Procedure Laterality Date  . Appendectomy  1976  . Abdominal hysterectomy  1998    partial  . Tonsillectomy  1970s  . Knee arthroscopy Left 03/26/2008  . Tubal ligation  1996  . Salivary stone removal Right 1972  . Colonoscopy    . Portacath placement N/A 11/27/2014    Procedure: INSERTION PORT-A-CATH;  Surgeon: Matthew Wakefield, MD;  Location: [facility];  Service: General;  Laterality: N/A;  . Plantar's wart excision Left     x 2  . Radioactive seed guided mastectomy with axillary sentinel lymph node biopsy Right 03/02/2015    Procedure: RADIOACTIVE SEED GUIDED RIGHT PARTIAL MASTECTOMY WITH RIGHT AXILLARY SENTINEL LYMPH NODE BIOPSY;  Surgeon: Matthew Wakefield, MD;  Location: [facility];  Service: General;   Laterality: Right;  . Port-a-cath removal Left 04/06/2015    Procedure: REMOVAL PORT-A-CATH;  Surgeon: Matthew Wakefield, MD;  Location: [facility];  Service: General;  Laterality: Left;    There were no vitals filed for this visit.  Visit Diagnosis:  Stiffness of joint, shoulder region, right  Lymphedema  Right shoulder pain  Abnormal posture      Subjective Assessment - 04/10/15 0816    Subjective Had simulation this week and had only miinor problems getting her arm into the postition   Pertinent History Patient diagnosed 11/13/14 with right Triple negative breast cancer with a Ki67 of 20%.  completed chemotherapy  Had umpectomy with 3 (?) nodes removed  on may 23 Goes for CT simulation on june 29 to have port removed on june 27   Patient Stated Goals to get my arm feeling back to normal    Currently in Pain? Yes   Pain Score 3    Pain Location Arm   Pain Orientation Lower;Right   Pain Descriptors / Indicators Sore   Pain Radiating Towards from wrist up to shoulder in inner arm    Pain Frequency Intermittent   Aggravating Factors  at end of the day    Pain Relieving Factors the sleeve                LYMPHEDEMA/ONCOLOGY QUESTIONNAIRE - 04/10/15 0819      [facility] [address] [place], [place], [address] Phone: [phone]   Fax:  [phone]  Physical Therapy Treatment  Patient Details  Name: Katie Jacobson MRN: [id number] Date of Birth: 01/06/1956 Referring Provider:  Wakefield, Matthew, MD  Encounter Date: 04/10/2015      PT End of Session - 04/10/15 0858    Visit Number 5   Number of Visits 9   Date for PT Re-Evaluation 04/30/15   PT Start Time 0815   PT Stop Time 0845   PT Time Calculation (min) 30 min   Activity Tolerance Patient tolerated treatment well   Behavior During Therapy WFL for tasks assessed/performed      Past Medical History  Diagnosis Date  . Osteoporosis   . Mitral valve prolapse 1990  . Breast cancer of upper-outer quadrant of right female breast 11/14/2014  . History of seizures     as a child - unknown cause - states was never on anticonvulsants  . History of kidney stones   . Seasonal allergies   . Hypertension     states under control with meds., has been on med. x "years"  . History of chemotherapy     finished chemo 02/03/2015  . Breast cancer, right breast     Past Surgical History  Procedure Laterality Date  . Appendectomy  1976  . Abdominal hysterectomy  1998    partial  . Tonsillectomy  1970s  . Knee arthroscopy Left 03/26/2008  . Tubal ligation  1996  . Salivary stone removal Right 1972  . Colonoscopy    . Portacath placement N/A 11/27/2014    Procedure: INSERTION PORT-A-CATH;  Surgeon: Matthew Wakefield, MD;  Location: [facility];  Service: General;  Laterality: N/A;  . Plantar's wart excision Left     x 2  . Radioactive seed guided mastectomy with axillary sentinel lymph node biopsy Right 03/02/2015    Procedure: RADIOACTIVE SEED GUIDED RIGHT PARTIAL MASTECTOMY WITH RIGHT AXILLARY SENTINEL LYMPH NODE BIOPSY;  Surgeon: Matthew Wakefield, MD;  Location: [facility];  Service: General;   Laterality: Right;  . Port-a-cath removal Left 04/06/2015    Procedure: REMOVAL PORT-A-CATH;  Surgeon: Matthew Wakefield, MD;  Location: [facility];  Service: General;  Laterality: Left;    There were no vitals filed for this visit.  Visit Diagnosis:  Stiffness of joint, shoulder region, right  Lymphedema  Right shoulder pain  Abnormal posture      Subjective Assessment - 04/10/15 0816    Subjective Had simulation this week and had only miinor problems getting her arm into the postition   Pertinent History Patient diagnosed 11/13/14 with right Triple negative breast cancer with a Ki67 of 20%.  completed chemotherapy  Had umpectomy with 3 (?) nodes removed  on may 23 Goes for CT simulation on june 29 to have port removed on june 27   Patient Stated Goals to get my arm feeling back to normal    Currently in Pain? Yes   Pain Score 3    Pain Location Arm   Pain Orientation Lower;Right   Pain Descriptors / Indicators Sore   Pain Radiating Towards from wrist up to shoulder in inner arm    Pain Frequency Intermittent   Aggravating Factors  at end of the day    Pain Relieving Factors the sleeve                LYMPHEDEMA/ONCOLOGY QUESTIONNAIRE - 04/10/15 0819      [place], [place], [address] Phone: [phone]   Fax:  [phone]  Physical Therapy Treatment  Patient Details  Name: Katie Jacobson MRN: [id number] Date of Birth: May 20, 1956 Referring Provider:  Rolm Bookbinder, MD  Encounter Date: 04/10/2015      PT End of Session - 04/10/15 0858    Visit Number 5   Number of Visits 9   Date for PT Re-Evaluation 04/30/15   PT Start Time 0815   PT Stop Time 0845   PT Time Calculation (min) 30 min   Activity Tolerance Patient tolerated treatment well   Behavior During Therapy [facility] for tasks assessed/performed      Past Medical History  Diagnosis Date  . Osteoporosis   . Mitral valve prolapse 1990  . Breast cancer of upper-outer quadrant of right female breast 11/14/2014  . History of seizures     as a child - unknown cause - states was never on anticonvulsants  . History of kidney stones   . Seasonal allergies   . Hypertension     states under control with meds., has been on med. x "years"  . History of chemotherapy     finished chemo 02/03/2015  . Breast cancer, right breast     Past Surgical History  Procedure Laterality Date  . Appendectomy  1976  . Abdominal hysterectomy  1998    partial  . Tonsillectomy  1970s  . Knee arthroscopy Left 03/26/2008  . Tubal ligation  1996  . Salivary stone removal Right 1972  . Colonoscopy    . Portacath placement N/A 11/27/2014    Procedure: INSERTION PORT-A-CATH;  Surgeon: Rolm Bookbinder, MD;  Location: [place];  Service: General;  Laterality: N/A;  . Plantar's wart excision Left     x 2  . Radioactive seed guided mastectomy with axillary sentinel lymph node biopsy Right 03/02/2015    Procedure: RADIOACTIVE SEED GUIDED RIGHT PARTIAL MASTECTOMY WITH RIGHT AXILLARY SENTINEL LYMPH NODE BIOPSY;  Surgeon: Rolm Bookbinder, MD;  Location: [place];  Service: General;   Laterality: Right;  . Port-a-cath removal Left 04/06/2015    Procedure: REMOVAL PORT-A-CATH;  Surgeon: Rolm Bookbinder, MD;  Location: [facility];  Service: General;  Laterality: Left;    There were no vitals filed for this visit.  Visit Diagnosis:  Stiffness of joint, shoulder region, right  Lymphedema  Right shoulder pain  Abnormal posture      Subjective Assessment - 04/10/15 0816    Subjective Had simulation this week and had only miinor problems getting her arm into the postition   Pertinent History Patient diagnosed 11/13/14 with right Triple negative breast cancer with a Ki67 of 20%.  completed chemotherapy  Had umpectomy with 3 (?) nodes removed  on may 23 Goes for CT simulation on june 29 to have port removed on june 27   Patient Stated Goals to get my arm feeling back to normal    Currently in Pain? Yes   Pain Score 3    Pain Location Arm   Pain Orientation Lower;Right   Pain Descriptors / Indicators Sore   Pain Radiating Towards from wrist up to shoulder in inner arm    Pain Frequency Intermittent   Aggravating Factors  at end of the day    Pain Relieving Factors the sleeve                LYMPHEDEMA/ONCOLOGY QUESTIONNAIRE - 04/10/15 1601

## 2015-04-14 ENCOUNTER — Other Ambulatory Visit: Payer: Self-pay | Admitting: Nurse Practitioner

## 2015-04-14 ENCOUNTER — Other Ambulatory Visit: Payer: Self-pay | Admitting: Oncology

## 2015-04-15 ENCOUNTER — Ambulatory Visit: Payer: BLUE CROSS/BLUE SHIELD

## 2015-04-15 DIAGNOSIS — C50911 Malignant neoplasm of unspecified site of right female breast: Secondary | ICD-10-CM

## 2015-04-15 DIAGNOSIS — M25611 Stiffness of right shoulder, not elsewhere classified: Secondary | ICD-10-CM

## 2015-04-15 DIAGNOSIS — M25511 Pain in right shoulder: Secondary | ICD-10-CM

## 2015-04-15 DIAGNOSIS — Z9189 Other specified personal risk factors, not elsewhere classified: Secondary | ICD-10-CM

## 2015-04-15 DIAGNOSIS — R293 Abnormal posture: Secondary | ICD-10-CM

## 2015-04-15 DIAGNOSIS — I890 Lymphedema, not elsewhere classified: Secondary | ICD-10-CM

## 2015-04-15 NOTE — Therapy (Signed)
[place], [place], [address] Phone: [phone]   Fax:  [phone]  Physical Therapy Treatment  Patient Details  Name: Katie Jacobson MRN: [id number] Date of Birth: 1956/05/02 Referring Provider:  Rolm Bookbinder, MD  Encounter Date: 04/15/2015      PT End of Session - 04/15/15 1347    Visit Number 6   Number of Visits 9   Date for PT Re-Evaluation 04/30/15   PT Start Time 1301   PT Stop Time 1346   PT Time Calculation (min) 45 min   Activity Tolerance Patient tolerated treatment well   Behavior During Therapy [facility] for tasks assessed/performed      Past Medical History  Diagnosis Date  . Osteoporosis   . Mitral valve prolapse 1990  . Breast cancer of upper-outer quadrant of right female breast 11/14/2014  . History of seizures     as a child - unknown cause - states was never on anticonvulsants  . History of kidney stones   . Seasonal allergies   . Hypertension     states under control with meds., has been on med. x "years"  . History of chemotherapy     finished chemo 02/03/2015  . Breast cancer, right breast     Past Surgical History  Procedure Laterality Date  . Appendectomy  1976  . Abdominal hysterectomy  1998    partial  . Tonsillectomy  1970s  . Knee arthroscopy Left 03/26/2008  . Tubal ligation  1996  . Salivary stone removal Right 1972  . Colonoscopy    . Portacath placement N/A 11/27/2014    Procedure: INSERTION PORT-A-CATH;  Surgeon: Rolm Bookbinder, MD;  Location: [facility];  Service: General;  Laterality: N/A;  . Plantar's wart excision Left     x 2  . Radioactive seed guided mastectomy with axillary sentinel lymph node biopsy Right 03/02/2015    Procedure: RADIOACTIVE SEED GUIDED RIGHT PARTIAL MASTECTOMY WITH RIGHT AXILLARY SENTINEL LYMPH NODE BIOPSY;  Surgeon: Rolm Bookbinder, MD;  Location: [facility];  Service: General;   Laterality: Right;  . Port-a-cath removal Left 04/06/2015    Procedure: REMOVAL PORT-A-CATH;  Surgeon: Rolm Bookbinder, MD;  Location: [place];  Service: General;  Laterality: Left;    There were no vitals filed for this visit.  Visit Diagnosis:  Stiffness of joint, shoulder region, right  Lymphedema  Right shoulder pain  Abnormal posture  At risk for lymphedema  Breast cancer, right breast          [facility] PT Assessment - 04/15/15 0001    AROM   Right Shoulder Flexion 147 Degrees                     [facility] Adult PT Treatment/Exercise - 04/15/15 0001    Manual Therapy   Soft tissue mobilization antecubital fossa at cording   Manual Lymphatic Drainage (MLD) In Supine: Short neck, diaphragmatic breathing, Lt axilla and Rt inguinal nodes, anterior inter-axillary and Rt axillo-inguinal anastomosis, and Rt UE from dorsal hand to lateral shoulder reviewing throughout.   Passive ROM In Supine to Rt shoulder into flexion and abduction and elbow extension throughout for cording all to pts tolerance.                     Short Term Clinic Goals - 03/31/15 1759    CC Short Term Goal  #1   Title Short term goals =  long term goals             Long Term Clinic Goals - 04/15/15 1352    CC Long Term Goal  #1   Title Patient will report a decrease in pain by 50% so they can perform daily activities with greater ease  Pt reports pain improved 50%   Status Achieved   CC Long Term Goal  #2   Title Patient will improve shoulder flexion range of motion to 120 degrees to position for radiation treatment  147 degrees attained 04/15/15   Status Achieved   CC Long Term Goal  #3   Title Patient will have a circumferential reduction of   2        cm at    10  cm above the         ulnar styloid    Status On-going   CC Long Term Goal  #4   Title Patient will decrease the DASH score to < 35   to demonstrate increased functional use of upper extremity    Status On-going            Plan - 04/15/15 1348    Clinical Impression Statement Patient diagnosed with 2 superficial clots at Rt forearm and is taking 2 aspirin daily until dissolved. Her PROM continues to improve slowly, though her cording is still tender to touch. AROM greatly improved, goal met.   Pt will benefit from skilled therapeutic intervention in order to improve on the following deficits Decreased range of motion;Increased edema;Impaired UE functional use;Decreased knowledge of precautions;Pain;Decreased strength;Increased fascial restricitons   Rehab Potential Good   Clinical Impairments Affecting Rehab Potential previous chemo; starts radiation beinning of July 8   PT Frequency 2x / week   PT Duration 4 weeks   PT Treatment/Interventions Patient/family education;Therapeutic exercise;Taping;ADLs/Self Care Home Management;DME Instruction;Manual techniques;Therapeutic activities;Manual lymph drainage;Compression bandaging;Scar mobilization;Passive range of motion   PT Next Visit Plan  Continue and review Manual lymph drainage as this also improved her sensitivity; stretching to cording at elbow.avoid proximal lateral forearm and are of tender spots  A/AA/ PROM cane exercise, progress HEP prn.    Consulted and Agree with Plan of Care Patient        Problem List Patient Active Problem List   Diagnosis Date Noted  . Yeast infection of the skin 01/20/2015  . Heartburn 01/13/2015  . Dehydration 12/30/2014  . Genetic testing 12/15/2014  . Vaginal dryness 12/09/2014  . Family history of breast cancer   . Breast cancer of upper-outer quadrant of right female breast 11/14/2014  . Heart palpitations   . Obesity (BMI 35.0-39.9 without comorbidity)   . Need for pneumococcal vaccine 07/06/2012  . Hyperlipidemia LDL goal <130 07/07/2011  . General medical examination 07/07/2011  . Fibroids 04/01/2011  . History of partial seizures 04/01/2011  . Asthma 04/01/2011  . Mitral  valve prolapse 04/01/2011  . Hot flashes 04/01/2011  . Tobacco abuse 04/01/2011  . Essential hypertension 04/01/2011    Otelia Limes, PTA 04/15/2015, 1:55 PM  [place], [place], [address] Phone: [phone]   Fax:  [phone]

## 2015-04-16 ENCOUNTER — Ambulatory Visit: Payer: BLUE CROSS/BLUE SHIELD | Admitting: Radiation Oncology

## 2015-04-17 ENCOUNTER — Ambulatory Visit
Admission: RE | Admit: 2015-04-17 | Discharge: 2015-04-17 | Disposition: A | Discharge status: Home / Self Care | Payer: BLUE CROSS/BLUE SHIELD | Source: Ambulatory Visit | Attending: Radiation Oncology | Admitting: Radiation Oncology

## 2015-04-17 DIAGNOSIS — Z510 Encounter for antineoplastic radiation therapy: Secondary | ICD-10-CM | POA: Diagnosis not present

## 2015-04-19 ENCOUNTER — Other Ambulatory Visit: Payer: Self-pay | Admitting: Cardiology

## 2015-04-20 ENCOUNTER — Ambulatory Visit: Payer: BLUE CROSS/BLUE SHIELD | Admitting: Physical Therapy

## 2015-04-20 ENCOUNTER — Encounter: Payer: Self-pay | Admitting: *Deleted

## 2015-04-20 ENCOUNTER — Ambulatory Visit
Admission: RE | Admit: 2015-04-20 | Discharge: 2015-04-20 | Disposition: A | Discharge status: Home / Self Care | Payer: BLUE CROSS/BLUE SHIELD | Source: Ambulatory Visit | Attending: Radiation Oncology | Admitting: Radiation Oncology

## 2015-04-20 ENCOUNTER — Encounter: Payer: Self-pay | Admitting: Radiation Oncology

## 2015-04-20 VITALS — BP 130/66 | HR 75 | Temp 98.5°F | Resp 12 | Wt 211.9 lb

## 2015-04-20 DIAGNOSIS — I890 Lymphedema, not elsewhere classified: Secondary | ICD-10-CM

## 2015-04-20 DIAGNOSIS — M25611 Stiffness of right shoulder, not elsewhere classified: Secondary | ICD-10-CM

## 2015-04-20 DIAGNOSIS — M25511 Pain in right shoulder: Secondary | ICD-10-CM

## 2015-04-20 DIAGNOSIS — Z510 Encounter for antineoplastic radiation therapy: Secondary | ICD-10-CM | POA: Diagnosis not present

## 2015-04-20 DIAGNOSIS — C50411 Malignant neoplasm of upper-outer quadrant of right female breast: Secondary | ICD-10-CM

## 2015-04-20 NOTE — Telephone Encounter (Signed)
Rx(s) sent to pharmacy electronically.  

## 2015-04-20 NOTE — Progress Notes (Signed)
PAIN: She is currently in no pain.  SKIN: Pt right breast- positive for Hyperpigmentation and breast tenderness.  Pt reports edema over right upper extremity.  Pt coming to nursing for patient education tomorrow.   BP 130/66 mmHg  Pulse 75  Temp(Src) 98.5 F (36.9 C) (Oral)  Resp 12  Wt 211 lb 14.4 oz (96.117 kg)  SpO2 100%  LMP 10/10/1996 Wt Readings from Last 3 Encounters:  04/20/15 211 lb 14.4 oz (96.117 kg)  04/06/15 213 lb 8 oz (96.843 kg)  03/20/15 212 lb 4.8 oz (96.299 kg)

## 2015-04-20 NOTE — Progress Notes (Unsigned)
Oncology Nurse Navigator Documentation  Oncology Nurse Navigator Flowsheets 04/20/2015  Navigator Encounter Type Initial RadOnc  Barriers/Navigation Needs Financial   Spoke with patient today at her 1st radiation treatment.  She is doing well. She was anxious about getting her 1st radiation and not knowing what to expect. She did lose two of her toenails from chemo.  She has voiced some financial concerns and I have given her Lenise's information to contact for some financial help.  Informed her I would following her through her radiation and to call with any concerns or needs.

## 2015-04-20 NOTE — Addendum Note (Signed)
Encounter addended by: Jenene Slicker, RN on: 04/20/2015  4:23 PM<BR>     Documentation filed: Inpatient Patient Education, Notes Section

## 2015-04-20 NOTE — Progress Notes (Addendum)
RADIATION TREATMENT  SKIN CARE-BREAST    RECOMMENDATIONS: ? Use unscented soap (Dove) ? When showering it is fine for water to touch the area, but please avoid direct spray on the treatment field if skin becomes irritated.  Also, wash inside and around the marked area ? When drying gently blot the area  ? PLEASE DO NOT APPLY ANY OTHER CREAMS, LOTIONS, POWDERS, PERFUMES, OILS OR ALCOHOL PRODUCTS (OTHER THAN WHAT IS GIVEN TO YOU BY THE RADIATION TEAM) TO THE TREATMENT AREA DURING RADIATION THERAPY   SKIN CARE: ? Moisturizer o You will be given (Radiaplex Gel) to use. Apply twice daily, once after treatment and then again prior to bedtime o Your Radiation Oncologist may suggest other skin care products as needed  DEODORANT:  ? Nursing will provide deodorant Jethro Poling) to be used during your radiation treatment  Deodorant Alternatives: ? A combination of equal amounts of baking soda and corn starch.  Mix together and powder puff on ? Toms of [place]-available at [employer] ([facility] [place]) ? [place] Crystal Deodorant Mist-100% Natural Mineral Salts and Purified water ([facility] [employer])   PLEASE DO NOT APPLY THE RADIAPLEX GEL OR ALRA DEODORANT WITHIN 4 HOURS PRIOR TO RADIATION TREATMENT Pt here for patient teaching.  Pt given Radiation and You booklet, skin care instructions, Alra deodorant and Radiaplex gel. Reviewed areas of pertinence such as fatigue, hair loss, skin changes, breast tenderness, breast swelling, shortness of breath, earaches and taste changes . Pt able to give teach back of to pat skin and use unscented/gentle soap,apply Radiaplex bid, avoid applying anything to skin within 4 hours of treatment, avoid wearing an under wire bra and to use an electric razor if they must shave. Pt demonstrated understanding of information given and will contact nursing with any questions or concerns.

## 2015-04-20 NOTE — Therapy (Signed)
[facility] [address] [place], [place], [address] Phone: [phone]   Fax:  [phone]  Physical Therapy Treatment  Patient Details  Name: Katie Jacobson MRN: [id number] Date of Birth: 08/21/1956 Referring Provider:  Emelia Loron, MD  Encounter Date: 04/20/2015      PT End of Session - 04/20/15 2151    Visit Number 7   Number of Visits 9   Date for PT Re-Evaluation 04/30/15   PT Start Time 1354   PT Stop Time 1437   PT Time Calculation (min) 43 min   Activity Tolerance Patient tolerated treatment well   Behavior During Therapy [facility] for tasks assessed/performed      Past Medical History  Diagnosis Date  . Osteoporosis   . Mitral valve prolapse 1990  . Breast cancer of upper-outer quadrant of right female breast 11/14/2014  . History of seizures     as a child - unknown cause - states was never on anticonvulsants  . History of kidney stones   . Seasonal allergies   . Hypertension     states under control with meds., has been on med. x "years"  . History of chemotherapy     finished chemo 02/03/2015  . Breast cancer, right breast     Past Surgical History  Procedure Laterality Date  . Appendectomy  1976  . Abdominal hysterectomy  1998    partial  . Tonsillectomy  1970s  . Knee arthroscopy Left 03/26/2008  . Tubal ligation  1996  . Salivary stone removal Right 1972  . Colonoscopy    . Portacath placement N/A 11/27/2014    Procedure: INSERTION PORT-A-CATH;  Surgeon: Emelia Loron, MD;  Location: [facility];  Service: General;  Laterality: N/A;  . Plantar's wart excision Left     x 2  . Radioactive seed guided mastectomy with axillary sentinel lymph node biopsy Right 03/02/2015    Procedure: RADIOACTIVE SEED GUIDED RIGHT PARTIAL MASTECTOMY WITH RIGHT AXILLARY SENTINEL LYMPH NODE BIOPSY;  Surgeon: Emelia Loron, MD;  Location: [facility];  Service: General;   Laterality: Right;  . Port-a-cath removal Left 04/06/2015    Procedure: REMOVAL PORT-A-CATH;  Surgeon: Emelia Loron, MD;  Location: [facility];  Service: General;  Laterality: Left;    There were no vitals filed for this visit.  Visit Diagnosis:  Stiffness of joint, shoulder region, right  Lymphedema  Right shoulder pain      Subjective Assessment - 04/20/15 1357    Subjective My hand is swollen.  That wasn't like this before.  Will have first radiation treatment today.   Currently in Pain? No/denies                         [facility] Adult PT Treatment/Exercise - 04/20/15 0001    Manual Therapy   Manual Therapy Myofascial release   Manual therapy comments Right UE neural tension stretch.    Soft tissue mobilization  of antecubital fossa at cording   Myofascial Release gentle right UE myofascial pulling   Manual Lymphatic Drainage (MLD) In Supine: Short neck, diaphragmatic breathing, Lt axilla and Rt inguinal nodes, anterior inter-axillary and Rt axillo-inguinal anastomosis, and Rt UE from fingers to lateral shoulder reviewing throughout.   Passive ROM In supine, right shoulder IR, ER, abduction, and flexion to tolerance;                   Short Term Clinic Goals -  03/31/15 1759    CC Short Term Goal  #1   Title Short term goals = long term goals             Long Term Clinic Goals - 04/15/15 1352    CC Long Term Goal  #1   Title Patient will report a decrease in pain by 50% so they can perform daily activities with greater ease  Pt reports pain improved 50%   Status Achieved   CC Long Term Goal  #2   Title Patient will improve shoulder flexion range of motion to 120 degrees to position for radiation treatment  147 degrees attained 04/15/15   Status Achieved   CC Long Term Goal  #3   Title Patient will have a circumferential reduction of   2        cm at    10  cm above the         ulnar styloid    Status On-going   CC Long  Term Goal  #4   Title Patient will decrease the DASH score to < 35   to demonstrate increased functional use of upper extremity   Status On-going         Head and Neck Clinic Goals - 04/20/15 2156    Patient will be able to verbalize understanding of lymphedema risk and availability of treatment for this condition.    Status --           Plan - 04/20/15 2152    Clinical Impression Statement Patient still with mild cording at right anterior elbow; no cording palpable in right axilla today.  Pt. felt like she learned different things today as her questions were answered and she reported getting some new ideas today.   Pt will benefit from skilled therapeutic intervention in order to improve on the following deficits Decreased range of motion;Increased edema;Impaired UE functional use;Decreased knowledge of precautions;Pain;Decreased strength;Increased fascial restricitons   Rehab Potential Good   PT Frequency 2x / week   PT Duration 4 weeks   PT Treatment/Interventions Manual lymph drainage;Manual techniques;Patient/family education   PT Next Visit Plan  Continue and review Manual lymph drainage as this also improved her sensitivity; stretching to cording at elbow.avoid proximal lateral forearm and are of tender spots  A/AA/ PROM cane exercise, progress HEP prn.    Consulted and Agree with Plan of Care Patient        Problem List Patient Active Problem List   Diagnosis Date Noted  . Yeast infection of the skin 01/20/2015  . Heartburn 01/13/2015  . Dehydration 12/30/2014  . Genetic testing 12/15/2014  . Vaginal dryness 12/09/2014  . Family history of breast cancer   . Breast cancer of upper-outer quadrant of right female breast 11/14/2014  . Heart palpitations   . Obesity (BMI 35.0-39.9 without comorbidity)   . Need for pneumococcal vaccine 07/06/2012  . Hyperlipidemia LDL goal <130 07/07/2011  . General medical examination 07/07/2011  . Fibroids 04/01/2011  . History  of partial seizures 04/01/2011  . Asthma 04/01/2011  . Mitral valve prolapse 04/01/2011  . Hot flashes 04/01/2011  . Tobacco abuse 04/01/2011  . Essential hypertension 04/01/2011    Latarra Eagleton 04/20/2015, 9:59 PM  [facility] [address] [place], [place], [address] Phone: [phone]   Fax:  [phone]     Micheline Maze, PT 04/20/2015 9:59 PM

## 2015-04-20 NOTE — Progress Notes (Signed)
   Weekly Management Note:  outpatient    ICD-9-CM ICD-10-CM   1. Breast cancer of upper-outer quadrant of right female breast 174.4 C50.411     Current Dose:  2 Gy  Projected Dose: 60 Gy   Narrative:  The patient presents for routine under treatment assessment.  CBCT/MVCT images/Port film x-rays were reviewed.  The chart was checked. Doing well  Physical Findings:  weight is 211 lb 14.4 oz (96.117 kg). Her oral temperature is 98.5 F (36.9 C). Her blood pressure is 130/66 and her pulse is 75. Her respiration is 12 and oxygen saturation is 100%.   Wt Readings from Last 3 Encounters:  04/20/15 211 lb 14.4 oz (96.117 kg)  04/06/15 213 lb 8 oz (96.843 kg)  03/20/15 212 lb 4.8 oz (96.299 kg)   No skin changes over breast  Impression:  The patient is tolerating radiotherapy.  Plan:  Continue radiotherapy as planned.    ________________________________   Eppie Gibson, M.D.

## 2015-04-21 ENCOUNTER — Ambulatory Visit
Admission: RE | Admit: 2015-04-21 | Discharge: 2015-04-21 | Disposition: A | Discharge status: Home / Self Care | Payer: BLUE CROSS/BLUE SHIELD | Source: Ambulatory Visit | Attending: Radiation Oncology | Admitting: Radiation Oncology

## 2015-04-21 ENCOUNTER — Telehealth: Payer: Self-pay | Admitting: *Deleted

## 2015-04-21 DIAGNOSIS — Z510 Encounter for antineoplastic radiation therapy: Secondary | ICD-10-CM | POA: Diagnosis not present

## 2015-04-21 DIAGNOSIS — C50411 Malignant neoplasm of upper-outer quadrant of right female breast: Secondary | ICD-10-CM

## 2015-04-21 MED ORDER — ASPIRIN 81 MG PO TABS: 81.0000 mg | ORAL_TABLET | Freq: Every day | ORAL | Status: AC

## 2015-04-21 NOTE — Telephone Encounter (Signed)
Per Dr. Jana Hakim, I informed patient that she was negative for a DVT involving the right upper extremity, however, she has a superficial vein clot in the right cephalic. Patient has been taking Aspirin 81 mg tablets, 162 mg daily. Per Dr. Jana Hakim, patient may decrease Aspirin 81 mg to one tablet daily after 10 days. Patient verbalized understanding.

## 2015-04-22 ENCOUNTER — Ambulatory Visit
Admission: RE | Admit: 2015-04-22 | Discharge: 2015-04-22 | Disposition: A | Discharge status: Home / Self Care | Payer: BLUE CROSS/BLUE SHIELD | Source: Ambulatory Visit | Attending: Radiation Oncology | Admitting: Radiation Oncology

## 2015-04-22 ENCOUNTER — Ambulatory Visit: Payer: BLUE CROSS/BLUE SHIELD | Admitting: Physical Therapy

## 2015-04-22 DIAGNOSIS — Z510 Encounter for antineoplastic radiation therapy: Secondary | ICD-10-CM | POA: Diagnosis not present

## 2015-04-22 DIAGNOSIS — M25611 Stiffness of right shoulder, not elsewhere classified: Secondary | ICD-10-CM | POA: Diagnosis not present

## 2015-04-22 DIAGNOSIS — M25511 Pain in right shoulder: Secondary | ICD-10-CM

## 2015-04-22 DIAGNOSIS — I890 Lymphedema, not elsewhere classified: Secondary | ICD-10-CM

## 2015-04-22 NOTE — Therapy (Signed)
[facility] [address] [place], [place], [address] Phone: [phone]   Fax:  [phone]  Physical Therapy Treatment  Patient Details  Name: Katie Jacobson MRN: [id number] Date of Birth: 1956/01/02 Referring Provider:  Emelia Loron, MD  Encounter Date: 04/22/2015      PT End of Session - 04/22/15 1240    Visit Number 8   Number of Visits 16   Date for PT Re-Evaluation 05/29/15   PT Start Time 1111   PT Stop Time 1153   PT Time Calculation (min) 42 min   Activity Tolerance Patient tolerated treatment well   Behavior During Therapy [facility] for tasks assessed/performed      Past Medical History  Diagnosis Date  . Osteoporosis   . Mitral valve prolapse 1990  . Breast cancer of upper-outer quadrant of right female breast 11/14/2014  . History of seizures     as a child - unknown cause - states was never on anticonvulsants  . History of kidney stones   . Seasonal allergies   . Hypertension     states under control with meds., has been on med. x "years"  . History of chemotherapy     finished chemo 02/03/2015  . Breast cancer, right breast     Past Surgical History  Procedure Laterality Date  . Appendectomy  1976  . Abdominal hysterectomy  1998    partial  . Tonsillectomy  1970s  . Knee arthroscopy Left 03/26/2008  . Tubal ligation  1996  . Salivary stone removal Right 1972  . Colonoscopy    . Portacath placement N/A 11/27/2014    Procedure: INSERTION PORT-A-CATH;  Surgeon: Emelia Loron, MD;  Location: [facility];  Service: General;  Laterality: N/A;  . Plantar's wart excision Left     x 2  . Radioactive seed guided mastectomy with axillary sentinel lymph node biopsy Right 03/02/2015    Procedure: RADIOACTIVE SEED GUIDED RIGHT PARTIAL MASTECTOMY WITH RIGHT AXILLARY SENTINEL LYMPH NODE BIOPSY;  Surgeon: Emelia Loron, MD;  Location: [facility];  Service: General;   Laterality: Right;  . Port-a-cath removal Left 04/06/2015    Procedure: REMOVAL PORT-A-CATH;  Surgeon: Emelia Loron, MD;  Location: [facility];  Service: General;  Laterality: Left;    There were no vitals filed for this visit.  Visit Diagnosis:  Lymphedema - Plan: PT plan of care cert/re-cert  Stiffness of joint, shoulder region, right - Plan: PT plan of care cert/re-cert  Right shoulder pain - Plan: PT plan of care cert/re-cert      Subjective Assessment - 04/22/15 1115    Subjective Just the swelling still going on.   Currently in Pain? Yes   Pain Score 4    Pain Location Hand   Pain Orientation Right   Pain Descriptors / Indicators Sore   Aggravating Factors  touching it   Pain Relieving Factors doesn't know; leave it alone               LYMPHEDEMA/ONCOLOGY QUESTIONNAIRE - 04/22/15 1116    Right Upper Extremity Lymphedema   10 cm Proximal to Olecranon Process 36.8 cm   Olecranon Process 28.8 cm   10 cm Proximal to Ulnar Styloid Process 27.4 cm   Just Proximal to Ulnar Styloid Process 18.7 cm   Across Hand at [facility] 21 cm   At [place] of 2nd Digit 5.9 cm  [facility] Adult PT Treatment/Exercise - 04/22/15 0001    Manual Therapy   Manual Therapy Compression Bandaging   Manual therapy comments Right UE neural tension stretch.    Manual Lymphatic Drainage (MLD) In Supine: Short neck, diaphragmatic breathing, Lt axilla and Rt inguinal nodes, anterior inter-axillary and Rt axillo-inguinal anastomosis, and Rt UE from forsal hand to lateral shoulder.   Compression Bandaging Lotion applied.  Trial of bandaging of right hand with stockinette, Elastomull to all fingers, 1/2 Artiflex, and 1-6 cm. bandage around hand and wrist only.   Passive ROM In supine, right shoulder IR, ER, abduction, and flexion to tolerance;                   Short Term Clinic Goals - 03/31/15 1759    CC Short Term Goal  #1   Title Short  term goals = long term goals             Long Term Clinic Goals - 04/22/15 1246    CC Long Term Goal  #1   Status Achieved   CC Long Term Goal  #2   Status Achieved   CC Long Term Goal  #3   Baseline reduced 1.3 cm. today from 28.7 to 27.4   Status Partially Met   CC Long Term Goal  #4   Status On-going         Head and Neck Clinic Goals - 04/20/15 2156    Patient will be able to verbalize understanding of lymphedema risk and availability of treatment for this condition.    Status --           Plan - 04/22/15 1240    Clinical Impression Statement Patient with little palpable cording today--reduced compared to last visit.  c/o increased hand swelling the last couple of times, and indeed her hand and wrist measurements were increased today, whereas measurements farther up her arm were reduced.   Pt will benefit from skilled therapeutic intervention in order to improve on the following deficits Decreased range of motion;Increased edema;Impaired UE functional use;Decreased knowledge of precautions;Pain;Decreased strength;Increased fascial restricitons   Rehab Potential Good   PT Frequency 2x / week   PT Duration 4 weeks   PT Treatment/Interventions Passive range of motion;Manual techniques;Manual lymph drainage;Compression bandaging   PT Next Visit Plan Check benefit of bandaging of right hand; continue manual lymph drainage and bandaging of hand if beneficial.  Recheck cording.   Consulted and Agree with Plan of Care Patient        Problem List Patient Active Problem List   Diagnosis Date Noted  . Yeast infection of the skin 01/20/2015  . Heartburn 01/13/2015  . Dehydration 12/30/2014  . Genetic testing 12/15/2014  . Vaginal dryness 12/09/2014  . Family history of breast cancer   . Breast cancer of upper-outer quadrant of right female breast 11/14/2014  . Heart palpitations   . Obesity (BMI 35.0-39.9 without comorbidity)   . Need for pneumococcal vaccine  07/06/2012  . Hyperlipidemia LDL goal <130 07/07/2011  . General medical examination 07/07/2011  . Fibroids 04/01/2011  . History of partial seizures 04/01/2011  . Asthma 04/01/2011  . Mitral valve prolapse 04/01/2011  . Hot flashes 04/01/2011  . Tobacco abuse 04/01/2011  . Essential hypertension 04/01/2011    Graceanne Guin 04/22/2015, 12:51 PM  [facility] [address] [place], [place], [address] Phone: [phone]   Fax:  [phone]     Micheline Maze, PT 04/22/2015 12:51 PM

## 2015-04-22 NOTE — Patient Instructions (Signed)
Keep hand bandage on as long as you feel able, up to two days.  Notice when you take it off whether your hand looks smaller.

## 2015-04-23 ENCOUNTER — Ambulatory Visit
Admission: RE | Admit: 2015-04-23 | Discharge: 2015-04-23 | Disposition: A | Discharge status: Home / Self Care | Payer: BLUE CROSS/BLUE SHIELD | Source: Ambulatory Visit | Attending: Radiation Oncology | Admitting: Radiation Oncology

## 2015-04-23 DIAGNOSIS — Z510 Encounter for antineoplastic radiation therapy: Secondary | ICD-10-CM | POA: Diagnosis not present

## 2015-04-24 ENCOUNTER — Ambulatory Visit
Admission: RE | Admit: 2015-04-24 | Discharge: 2015-04-24 | Disposition: A | Discharge status: Home / Self Care | Payer: BLUE CROSS/BLUE SHIELD | Source: Ambulatory Visit | Attending: Radiation Oncology | Admitting: Radiation Oncology

## 2015-04-24 DIAGNOSIS — Z510 Encounter for antineoplastic radiation therapy: Secondary | ICD-10-CM | POA: Diagnosis not present

## 2015-04-27 ENCOUNTER — Ambulatory Visit
Admission: RE | Admit: 2015-04-27 | Discharge: 2015-04-27 | Disposition: A | Discharge status: Home / Self Care | Payer: BLUE CROSS/BLUE SHIELD | Source: Ambulatory Visit | Attending: Radiation Oncology | Admitting: Radiation Oncology

## 2015-04-27 ENCOUNTER — Encounter: Payer: Self-pay | Admitting: Radiation Oncology

## 2015-04-27 VITALS — BP 121/64 | HR 76 | Temp 98.3°F | Resp 12 | Wt 216.2 lb

## 2015-04-27 DIAGNOSIS — Z510 Encounter for antineoplastic radiation therapy: Secondary | ICD-10-CM | POA: Diagnosis not present

## 2015-04-27 DIAGNOSIS — C50411 Malignant neoplasm of upper-outer quadrant of right female breast: Secondary | ICD-10-CM

## 2015-04-27 NOTE — Progress Notes (Signed)
   Weekly Management Note:  outpatient    ICD-9-CM ICD-10-CM   1. Breast cancer of upper-outer quadrant of right female breast 174.4 C50.411     Current Dose:  12 Gy  Projected Dose: 60 Gy   Narrative:  The patient presents for routine under treatment assessment.  CBCT/MVCT images/Port film x-rays were reviewed.  The chart was checked. Doing well  Physical Findings:  weight is 216 lb 3.2 oz (98.068 kg). Her oral temperature is 98.3 F (36.8 C). Her blood pressure is 121/64 and her pulse is 76. Her respiration is 12 and oxygen saturation is 100%.   Wt Readings from Last 3 Encounters:  04/27/15 216 lb 3.2 oz (98.068 kg)  04/20/15 211 lb 14.4 oz (96.117 kg)  04/06/15 213 lb 8 oz (96.843 kg)   Mild hyperpigmentation over right breast  Impression:  The patient is tolerating radiotherapy.  Plan:  Continue radiotherapy as planned.    ________________________________   Eppie Gibson, M.D.

## 2015-04-27 NOTE — Progress Notes (Signed)
PAIN: She rates her pain as a 4 on a scale of 0-10. constant, tingling and aching over right arm. SKIN: Pt right breast- positive for Hyperpigmentation.  Pt reports edema and lower right and over right upper extremity. She is wearing a sleeve throughout the day Pt continues to apply Radiaplex as directed.  BP 121/64 mmHg  Pulse 76  Temp(Src) 98.3 F (36.8 C) (Oral)  Resp 12  Wt 216 lb 3.2 oz (98.068 kg)  SpO2 100%  LMP 10/10/1996 Wt Readings from Last 3 Encounters:  04/27/15 216 lb 3.2 oz (98.068 kg)  04/20/15 211 lb 14.4 oz (96.117 kg)  04/06/15 213 lb 8 oz (96.843 kg)

## 2015-04-28 ENCOUNTER — Ambulatory Visit
Admission: RE | Admit: 2015-04-28 | Discharge: 2015-04-28 | Disposition: A | Discharge status: Home / Self Care | Payer: BLUE CROSS/BLUE SHIELD | Source: Ambulatory Visit | Attending: Radiation Oncology | Admitting: Radiation Oncology

## 2015-04-28 DIAGNOSIS — Z510 Encounter for antineoplastic radiation therapy: Secondary | ICD-10-CM | POA: Diagnosis not present

## 2015-04-29 ENCOUNTER — Ambulatory Visit
Admission: RE | Admit: 2015-04-29 | Discharge: 2015-04-29 | Disposition: A | Discharge status: Home / Self Care | Payer: BLUE CROSS/BLUE SHIELD | Source: Ambulatory Visit | Attending: Radiation Oncology | Admitting: Radiation Oncology

## 2015-04-29 DIAGNOSIS — Z510 Encounter for antineoplastic radiation therapy: Secondary | ICD-10-CM | POA: Diagnosis not present

## 2015-04-30 ENCOUNTER — Ambulatory Visit
Admission: RE | Admit: 2015-04-30 | Discharge: 2015-04-30 | Disposition: A | Discharge status: Home / Self Care | Payer: BLUE CROSS/BLUE SHIELD | Source: Ambulatory Visit | Attending: Radiation Oncology | Admitting: Radiation Oncology

## 2015-04-30 DIAGNOSIS — Z510 Encounter for antineoplastic radiation therapy: Secondary | ICD-10-CM | POA: Diagnosis not present

## 2015-05-01 ENCOUNTER — Ambulatory Visit
Admission: RE | Admit: 2015-05-01 | Discharge: 2015-05-01 | Disposition: A | Discharge status: Home / Self Care | Payer: BLUE CROSS/BLUE SHIELD | Source: Ambulatory Visit | Attending: Radiation Oncology | Admitting: Radiation Oncology

## 2015-05-01 DIAGNOSIS — Z510 Encounter for antineoplastic radiation therapy: Secondary | ICD-10-CM | POA: Diagnosis not present

## 2015-05-04 ENCOUNTER — Ambulatory Visit: Payer: BLUE CROSS/BLUE SHIELD

## 2015-05-04 ENCOUNTER — Ambulatory Visit
Admission: RE | Admit: 2015-05-04 | Discharge: 2015-05-04 | Disposition: A | Discharge status: Home / Self Care | Payer: BLUE CROSS/BLUE SHIELD | Source: Ambulatory Visit | Attending: Radiation Oncology | Admitting: Radiation Oncology

## 2015-05-04 ENCOUNTER — Encounter: Payer: Self-pay | Admitting: Radiation Oncology

## 2015-05-04 VITALS — BP 117/68 | HR 72 | Temp 98.3°F | Ht 61.0 in | Wt 210.7 lb

## 2015-05-04 DIAGNOSIS — C50411 Malignant neoplasm of upper-outer quadrant of right female breast: Secondary | ICD-10-CM

## 2015-05-04 DIAGNOSIS — R293 Abnormal posture: Secondary | ICD-10-CM

## 2015-05-04 DIAGNOSIS — I890 Lymphedema, not elsewhere classified: Secondary | ICD-10-CM

## 2015-05-04 DIAGNOSIS — M25611 Stiffness of right shoulder, not elsewhere classified: Secondary | ICD-10-CM | POA: Diagnosis not present

## 2015-05-04 DIAGNOSIS — M25511 Pain in right shoulder: Secondary | ICD-10-CM

## 2015-05-04 DIAGNOSIS — Z9189 Other specified personal risk factors, not elsewhere classified: Secondary | ICD-10-CM

## 2015-05-04 DIAGNOSIS — C50911 Malignant neoplasm of unspecified site of right female breast: Secondary | ICD-10-CM

## 2015-05-04 DIAGNOSIS — Z510 Encounter for antineoplastic radiation therapy: Secondary | ICD-10-CM | POA: Diagnosis not present

## 2015-05-04 NOTE — Therapy (Signed)
[place], [place], [address] Phone: [phone]   Fax:  [phone]  Physical Therapy Treatment  Patient Details  Name: Katie Jacobson MRN: [id number] Date of Birth: 1956-01-13 Referring Provider:  Rolm Bookbinder, MD  Encounter Date: 05/04/2015      PT End of Session - 05/04/15 1448    Visit Number 9   Number of Visits 16   Date for PT Re-Evaluation 05/29/15   PT Start Time 2458   PT Stop Time 1517   PT Time Calculation (min) 35 min   Activity Tolerance Patient tolerated treatment well   Behavior During Therapy [facility] for tasks assessed/performed      Past Medical History  Diagnosis Date  . Osteoporosis   . Mitral valve prolapse 1990  . Breast cancer of upper-outer quadrant of right female breast 11/14/2014  . History of seizures     as a child - unknown cause - states was never on anticonvulsants  . History of kidney stones   . Seasonal allergies   . Hypertension     states under control with meds., has been on med. x "years"  . History of chemotherapy     finished chemo 02/03/2015  . Breast cancer, right breast     Past Surgical History  Procedure Laterality Date  . Appendectomy  1976  . Abdominal hysterectomy  1998    partial  . Tonsillectomy  1970s  . Knee arthroscopy Left 03/26/2008  . Tubal ligation  1996  . Salivary stone removal Right 1972  . Colonoscopy    . Portacath placement N/A 11/27/2014    Procedure: INSERTION PORT-A-CATH;  Surgeon: Rolm Bookbinder, MD;  Location: [place];  Service: General;  Laterality: N/A;  . Plantar's wart excision Left     x 2  . Radioactive seed guided mastectomy with axillary sentinel lymph node biopsy Right 03/02/2015    Procedure: RADIOACTIVE SEED GUIDED RIGHT PARTIAL MASTECTOMY WITH RIGHT AXILLARY SENTINEL LYMPH NODE BIOPSY;  Surgeon: Rolm Bookbinder, MD;  Location: [place];  Service: General;   Laterality: Right;  . Port-a-cath removal Left 04/06/2015    Procedure: REMOVAL PORT-A-CATH;  Surgeon: Rolm Bookbinder, MD;  Location: [place];  Service: General;  Laterality: Left;    There were no vitals filed for this visit.  Visit Diagnosis:  Lymphedema  Stiffness of joint, shoulder region, right  Right shoulder pain  Abnormal posture  At risk for lymphedema  Breast cancer, right breast      Subjective Assessment - 05/04/15 1444    Subjective The bandage really helped my hand go down but I could not figure out how to rewrap it. My hand is just tender to touch today.    Currently in Pain? No/denies                         [facility] Adult PT Treatment/Exercise - 05/04/15 0001    Manual Therapy   Manual Therapy Compression Bandaging   Manual therapy comments Right UE neural tension stretch.    Manual Lymphatic Drainage (MLD) In Supine: Short neck, diaphragmatic breathing, Lt axilla and Rt inguinal nodes, anterior inter-axillary and Rt axillo-inguinal anastomosis, and Rt UE from forsal hand to lateral shoulder.   Compression Bandaging Lotion applied.  Trial of bandaging of right hand with stockinette, Elastomull to all fingers, 1/2 Artiflex, and 1-6 cm. bandage around hand and wrist only.   Passive ROM In  supine, right shoulder IR, ER, abduction, and flexion to tolerance;                PT Education - 05/04/15 1517    Education provided Yes   Education Details Compression bandaging   Person(s) Educated Patient;Spouse   Methods Explanation;Demonstration;Handout   Comprehension Verbalized understanding           Short Term Clinic Goals - 03/31/15 1759    CC Short Term Goal  #1   Title Short term goals = long term goals             Long Term Clinic Goals - 04/22/15 1246    CC Long Term Goal  #1   Status Achieved   CC Long Term Goal  #2   Status Achieved   CC Long Term Goal  #3   Baseline reduced 1.3 cm. today from  28.7 to 27.4   Status Partially Met   CC Long Term Goal  #4   Status On-going         Head and Neck Clinic Goals - 04/20/15 2156    Patient will be able to verbalize understanding of lymphedema risk and availability of treatment for this condition.    Status --           Plan - 05/04/15 1518    Clinical Impression Statement Patient with no palpable cording today and full PROM in all directions. Reviewed compression bandaging with her as performing today and pt verbalized very good understanding and seeing where she was getting confused when trying to rewrap herself. Patient has made great progress.   Pt will benefit from skilled therapeutic intervention in order to improve on the following deficits Decreased range of motion;Increased edema;Impaired UE functional use;Decreased knowledge of precautions;Pain;Decreased strength;Increased fascial restricitons   Rehab Potential Good   Clinical Impairments Affecting Rehab Potential previous chemo; starts radiation beinning of July 8   PT Frequency 2x / week   PT Duration 4 weeks   PT Treatment/Interventions Passive range of motion;Manual techniques;Manual lymph drainage;Compression bandaging   PT Next Visit Plan Assess next visit/have pr perform DASH to assess goal. Review bandaging of right hand prn; continue manual lymph drainage and bandaging of hand if beneficial.     Consulted and Agree with Plan of Care Patient        Problem List Patient Active Problem List   Diagnosis Date Noted  . Yeast infection of the skin 01/20/2015  . Heartburn 01/13/2015  . Dehydration 12/30/2014  . Genetic testing 12/15/2014  . Vaginal dryness 12/09/2014  . Family history of breast cancer   . Breast cancer of upper-outer quadrant of right female breast 11/14/2014  . Heart palpitations   . Obesity (BMI 35.0-39.9 without comorbidity)   . Need for pneumococcal vaccine 07/06/2012  . Hyperlipidemia LDL goal <130 07/07/2011  . General medical  examination 07/07/2011  . Fibroids 04/01/2011  . History of partial seizures 04/01/2011  . Asthma 04/01/2011  . Mitral valve prolapse 04/01/2011  . Hot flashes 04/01/2011  . Tobacco abuse 04/01/2011  . Essential hypertension 04/01/2011    Otelia Limes, PTA 05/04/2015, 3:21 PM  [place], [place], [address] Phone: [phone]   Fax:  [phone]

## 2015-05-04 NOTE — Progress Notes (Signed)
Weekly Management Note:  Site: right breast Current Dose:   2220  cGy Projected Dose:   5000  cGy  Followed by 5 fraction boost  Narrative: The patient is seen today for routine under treatment assessment. CBCT/MVCT images/port films were reviewed. The chart was reviewed.    She is without complaints today.  She is using Radioplex gel.  Physical Examination:  Filed Vitals:   05/04/15 1257  BP: 117/68  Pulse: 72  Temp: 98.3 F (36.8 C)  .  Weight: 210 lb 11.2 oz (95.573 kg).  There is mild hyperpigmentation the skin along the right breast with no areas of desquamation.  Impression: Tolerating radiation therapy well.  Plan: Continue radiation therapy as planned.

## 2015-05-04 NOTE — Progress Notes (Addendum)
Ms. Mcalpine with no voiced concerns.  Note hyperpigmentation of her right breast with intact, soft skin.

## 2015-05-05 ENCOUNTER — Ambulatory Visit
Admission: RE | Admit: 2015-05-05 | Discharge: 2015-05-05 | Disposition: A | Discharge status: Home / Self Care | Payer: BLUE CROSS/BLUE SHIELD | Source: Ambulatory Visit | Attending: Radiation Oncology | Admitting: Radiation Oncology

## 2015-05-05 DIAGNOSIS — Z510 Encounter for antineoplastic radiation therapy: Secondary | ICD-10-CM | POA: Diagnosis not present

## 2015-05-06 ENCOUNTER — Ambulatory Visit: Payer: BLUE CROSS/BLUE SHIELD

## 2015-05-06 ENCOUNTER — Ambulatory Visit
Admission: RE | Admit: 2015-05-06 | Discharge: 2015-05-06 | Disposition: A | Discharge status: Home / Self Care | Payer: BLUE CROSS/BLUE SHIELD | Source: Ambulatory Visit | Attending: Radiation Oncology | Admitting: Radiation Oncology

## 2015-05-06 DIAGNOSIS — M25611 Stiffness of right shoulder, not elsewhere classified: Secondary | ICD-10-CM

## 2015-05-06 DIAGNOSIS — I890 Lymphedema, not elsewhere classified: Secondary | ICD-10-CM

## 2015-05-06 DIAGNOSIS — Z9189 Other specified personal risk factors, not elsewhere classified: Secondary | ICD-10-CM

## 2015-05-06 DIAGNOSIS — M25511 Pain in right shoulder: Secondary | ICD-10-CM

## 2015-05-06 DIAGNOSIS — R293 Abnormal posture: Secondary | ICD-10-CM

## 2015-05-06 DIAGNOSIS — C50911 Malignant neoplasm of unspecified site of right female breast: Secondary | ICD-10-CM

## 2015-05-06 DIAGNOSIS — Z510 Encounter for antineoplastic radiation therapy: Secondary | ICD-10-CM | POA: Diagnosis not present

## 2015-05-06 NOTE — Therapy (Signed)
[place], [place], [address] Phone: [phone]   Fax:  [phone]  Physical Therapy Treatment  Patient Details  Name: Katie Jacobson MRN: [id number] Date of Birth: Dec 06, 1955 Referring Provider:  Rolm Bookbinder, MD  Encounter Date: 05/06/2015      PT End of Session - 05/06/15 1431    Visit Number 10   Number of Visits 16   Date for PT Re-Evaluation 05/29/15   PT Start Time 8638   PT Stop Time 1432   PT Time Calculation (min) 40 min   Activity Tolerance Patient tolerated treatment well   Behavior During Therapy [facility] for tasks assessed/performed      Past Medical History  Diagnosis Date  . Osteoporosis   . Mitral valve prolapse 1990  . Breast cancer of upper-outer quadrant of right female breast 11/14/2014  . History of seizures     as a child - unknown cause - states was never on anticonvulsants  . History of kidney stones   . Seasonal allergies   . Hypertension     states under control with meds., has been on med. x "years"  . History of chemotherapy     finished chemo 02/03/2015  . Breast cancer, right breast     Past Surgical History  Procedure Laterality Date  . Appendectomy  1976  . Abdominal hysterectomy  1998    partial  . Tonsillectomy  1970s  . Knee arthroscopy Left 03/26/2008  . Tubal ligation  1996  . Salivary stone removal Right 1972  . Colonoscopy    . Portacath placement N/A 11/27/2014    Procedure: INSERTION PORT-A-CATH;  Surgeon: Rolm Bookbinder, MD;  Location: [place];  Service: General;  Laterality: N/A;  . Plantar's wart excision Left     x 2  . Radioactive seed guided mastectomy with axillary sentinel lymph node biopsy Right 03/02/2015    Procedure: RADIOACTIVE SEED GUIDED RIGHT PARTIAL MASTECTOMY WITH RIGHT AXILLARY SENTINEL LYMPH NODE BIOPSY;  Surgeon: Rolm Bookbinder, MD;  Location: [place];  Service: General;   Laterality: Right;  . Port-a-cath removal Left 04/06/2015    Procedure: REMOVAL PORT-A-CATH;  Surgeon: Rolm Bookbinder, MD;  Location: [place];  Service: General;  Laterality: Left;    There were no vitals filed for this visit.  Visit Diagnosis:  Stiffness of joint, shoulder region, right  Lymphedema  Right shoulder pain  Abnormal posture  At risk for lymphedema  Breast cancer, right breast      Subjective Assessment - 05/06/15 1356    Subjective Tried rewrapping my hand this morning and started ok, but got confused after the first finger.    Currently in Pain? No/denies            [facility] PT Assessment - 05/06/15 0001    AROM   Right Shoulder Flexion 168 Degrees  WNLs   Right Shoulder ABduction 157 Degrees                     [facility] Adult PT Treatment/Exercise - 05/06/15 0001    Manual Therapy   Manual Lymphatic Drainage (MLD) In Supine: Short neck, diaphragmatic breathing, Lt axilla and Rt inguinal nodes, anterior inter-axillary and Rt axillo-inguinal anastomosis, and Rt UE from dorsal hand to lateral shoulder.   Compression Bandaging Lotion applied.  Right hand with stockinette, Elastomull to all fingers, 1/2 Artiflex, and 1-6 cm. bandage around hand and wrist only having pt perform  to assess her technique.                PT Education - 05/06/15 1400    Education provided Yes   Education Details Had pt perform compression bandaging to assess her technique   Person(s) Educated Patient   Methods Explanation;Demonstration   Comprehension Verbalized understanding;Returned demonstration;Need further instruction           Short Term Clinic Goals - 03/31/15 1759    CC Short Term Goal  #1   Title Short term goals = long term goals             Long Term Clinic Goals - 05/06/15 1436    CC Long Term Goal  #3   Title Patient will have a circumferential reduction of   2        cm at    10  cm above the         ulnar  styloid    Status Partially Met   CC Long Term Goal  #4   Title Patient will decrease the DASH score to < 35   to demonstrate increased functional use of upper extremity   Status On-going         Head and Neck Clinic Goals - 04/20/15 2156    Patient will be able to verbalize understanding of lymphedema risk and availability of treatment for this condition.    Status --           Plan - 05/06/15 1432    Clinical Impression Statement Reviewed with pt correct technique with compression bandaging and had her perform today with verbal and tactile cuing throughout. She was able to return demo with moderate cuing, will need further review to assess technique. She is to continue practicing at home and try to teach her husband. She has regained her AROM.   Pt will benefit from skilled therapeutic intervention in order to improve on the following deficits Decreased range of motion;Increased edema;Impaired UE functional use;Decreased knowledge of precautions;Pain;Decreased strength;Increased fascial restricitons   Rehab Potential Good   Clinical Impairments Affecting Rehab Potential previous chemo; starts radiation beinning of July 8   PT Frequency 2x / week   PT Next Visit Plan Have pt perform DASH to assess goal progress. Cont to review hand bandage and cont manual lymph drainage.   Consulted and Agree with Plan of Care Patient        Problem List Patient Active Problem List   Diagnosis Date Noted  . Yeast infection of the skin 01/20/2015  . Heartburn 01/13/2015  . Dehydration 12/30/2014  . Genetic testing 12/15/2014  . Vaginal dryness 12/09/2014  . Family history of breast cancer   . Breast cancer of upper-outer quadrant of right female breast 11/14/2014  . Heart palpitations   . Obesity (BMI 35.0-39.9 without comorbidity)   . Need for pneumococcal vaccine 07/06/2012  . Hyperlipidemia LDL goal <130 07/07/2011  . General medical examination 07/07/2011  . Fibroids 04/01/2011   . History of partial seizures 04/01/2011  . Asthma 04/01/2011  . Mitral valve prolapse 04/01/2011  . Hot flashes 04/01/2011  . Tobacco abuse 04/01/2011  . Essential hypertension 04/01/2011    Otelia Limes, PTA 05/06/2015, 3:36 PM  [place], [place], [address] Phone: [phone]   Fax:  [phone]

## 2015-05-07 ENCOUNTER — Ambulatory Visit
Admission: RE | Admit: 2015-05-07 | Discharge: 2015-05-07 | Disposition: A | Discharge status: Home / Self Care | Payer: BLUE CROSS/BLUE SHIELD | Source: Ambulatory Visit | Attending: Radiation Oncology | Admitting: Radiation Oncology

## 2015-05-07 DIAGNOSIS — Z510 Encounter for antineoplastic radiation therapy: Secondary | ICD-10-CM | POA: Diagnosis not present

## 2015-05-08 ENCOUNTER — Ambulatory Visit
Admission: RE | Admit: 2015-05-08 | Discharge: 2015-05-08 | Disposition: A | Discharge status: Home / Self Care | Payer: BLUE CROSS/BLUE SHIELD | Source: Ambulatory Visit | Attending: Radiation Oncology | Admitting: Radiation Oncology

## 2015-05-08 DIAGNOSIS — Z510 Encounter for antineoplastic radiation therapy: Secondary | ICD-10-CM | POA: Diagnosis not present

## 2015-05-11 ENCOUNTER — Ambulatory Visit
Admission: RE | Admit: 2015-05-11 | Discharge: 2015-05-11 | Disposition: A | Discharge status: Home / Self Care | Payer: BLUE CROSS/BLUE SHIELD | Source: Ambulatory Visit | Attending: Radiation Oncology | Admitting: Radiation Oncology

## 2015-05-11 ENCOUNTER — Ambulatory Visit: Payer: BLUE CROSS/BLUE SHIELD | Attending: General Surgery

## 2015-05-11 DIAGNOSIS — M25611 Stiffness of right shoulder, not elsewhere classified: Secondary | ICD-10-CM | POA: Insufficient documentation

## 2015-05-11 DIAGNOSIS — M25511 Pain in right shoulder: Secondary | ICD-10-CM

## 2015-05-11 DIAGNOSIS — R293 Abnormal posture: Secondary | ICD-10-CM

## 2015-05-11 DIAGNOSIS — Z510 Encounter for antineoplastic radiation therapy: Secondary | ICD-10-CM | POA: Diagnosis not present

## 2015-05-11 DIAGNOSIS — I890 Lymphedema, not elsewhere classified: Secondary | ICD-10-CM

## 2015-05-11 DIAGNOSIS — C50911 Malignant neoplasm of unspecified site of right female breast: Secondary | ICD-10-CM

## 2015-05-11 DIAGNOSIS — Z9189 Other specified personal risk factors, not elsewhere classified: Secondary | ICD-10-CM | POA: Insufficient documentation

## 2015-05-11 NOTE — Therapy (Signed)
[place], [place], [address] Phone: [phone]   Fax:  [phone]  Physical Therapy Treatment  Patient Details  Name: Katie Jacobson MRN: [id number] Date of Birth: 1956/01/29 Referring Provider:  Rolm Bookbinder, MD  Encounter Date: 05/11/2015      PT End of Session - 05/11/15 1032    Visit Number 11   Number of Visits 16   Date for PT Re-Evaluation 05/29/15   PT Start Time 9244   PT Stop Time 1105   PT Time Calculation (min) 42 min   Activity Tolerance Patient tolerated treatment well   Behavior During Therapy [facility] for tasks assessed/performed      Past Medical History  Diagnosis Date  . Osteoporosis   . Mitral valve prolapse 1990  . Breast cancer of upper-outer quadrant of right female breast 11/14/2014  . History of seizures     as a child - unknown cause - states was never on anticonvulsants  . History of kidney stones   . Seasonal allergies   . Hypertension     states under control with meds., has been on med. x "years"  . History of chemotherapy     finished chemo 02/03/2015  . Breast cancer, right breast     Past Surgical History  Procedure Laterality Date  . Appendectomy  1976  . Abdominal hysterectomy  1998    partial  . Tonsillectomy  1970s  . Knee arthroscopy Left 03/26/2008  . Tubal ligation  1996  . Salivary stone removal Right 1972  . Colonoscopy    . Portacath placement N/A 11/27/2014    Procedure: INSERTION PORT-A-CATH;  Surgeon: Rolm Bookbinder, MD;  Location: [place];  Service: General;  Laterality: N/A;  . Plantar's wart excision Left     x 2  . Radioactive seed guided mastectomy with axillary sentinel lymph node biopsy Right 03/02/2015    Procedure: RADIOACTIVE SEED GUIDED RIGHT PARTIAL MASTECTOMY WITH RIGHT AXILLARY SENTINEL LYMPH NODE BIOPSY;  Surgeon: Rolm Bookbinder, MD;  Location: [place];  Service: General;   Laterality: Right;  . Port-a-cath removal Left 04/06/2015    Procedure: REMOVAL PORT-A-CATH;  Surgeon: Rolm Bookbinder, MD;  Location: [place];  Service: General;  Laterality: Left;    There were no vitals filed for this visit.  Visit Diagnosis:  Stiffness of joint, shoulder region, right  Lymphedema  Right shoulder pain  Abnormal posture  At risk for lymphedema  Breast cancer, right breast      Subjective Assessment - 05/11/15 1025    Subjective Been wrapping my Rt hand at home, still not sure if I'm doing it quite right, but I'm trying. No pain, just still have that discomfort in my hand.   Currently in Pain? No/denies               LYMPHEDEMA/ONCOLOGY QUESTIONNAIRE - 05/11/15 1025    Right Upper Extremity Lymphedema   10 cm Proximal to Olecranon Process 36.3 cm   Olecranon Process 28.8 cm   10 cm Proximal to Ulnar Styloid Process 27 cm   Just Proximal to Ulnar Styloid Process 18.2 cm   Across Hand at [employer] 20.2 cm   At [place] of 2nd Digit 6 cm           Quick Dash - 05/11/15 0001    Open a tight or new jar Mild difficulty   Do heavy household chores (wash walls, wash floors) Mild  difficulty   Carry a shopping bag or briefcase Mild difficulty   Wash your back Mild difficulty   Use a knife to cut food Mild difficulty   Recreational activities in which you take some force or impact through your arm, shoulder, or hand (golf, hammering, tennis) Severe difficulty   During the past week, to what extent has your arm, shoulder or hand problem interfered with your normal social activities with family, friends, neighbors, or groups? Slightly   During the past week, to what extent has your arm, shoulder or hand problem limited your work or other regular daily activities Slightly   Arm, shoulder, or hand pain. Mild   Tingling (pins and needles) in your arm, shoulder, or hand Mild   Difficulty Sleeping Mild difficulty   DASH Score 29.55 %                [facility] Adult PT Treatment/Exercise - 05/11/15 0001    Manual Therapy   Manual Lymphatic Drainage (MLD) In Supine: Short neck, diaphragmatic breathing, Lt axilla and Rt inguinal nodes, anterior inter-axillary and Rt axillo-inguinal anastomosis, and Rt UE from dorsal hand to lateral shoulder.   Compression Bandaging Lotion applied.  Right hand with stockinette, Elastomull to all fingers, 1/2 Artiflex, and 1-6 cm. bandage around hand and wrist only reviewing with pt throughout.                   Short Term Clinic Goals - 03/31/15 1759    CC Short Term Goal  #1   Title Short term goals = long term goals             Long Term Clinic Goals - 05/11/15 1206    CC Long Term Goal  #4   Title Patient will decrease the DASH score to < 35   to demonstrate increased functional use of upper extremity  Pts DASH score 29.55 today 05/11/15   Status Achieved         Head and Neck Clinic Goals - 04/20/15 2156    Patient will be able to verbalize understanding of lymphedema risk and availability of treatment for this condition.    Status --           Plan - 05/11/15 1106    Clinical Impression Statement Pt continues to make progress with bandaging, though does still need cuing for Elastomull finger bandaging. Pts DASH score has improved to a 29.55, goal met.   Pt will benefit from skilled therapeutic intervention in order to improve on the following deficits Decreased range of motion;Increased edema;Impaired UE functional use;Decreased knowledge of precautions;Pain;Decreased strength;Increased fascial restricitons   Rehab Potential Good   Clinical Impairments Affecting Rehab Potential previous chemo; starts radiation beinning of July 8   PT Frequency 2x / week   PT Duration 4 weeks   PT Treatment/Interventions Passive range of motion;Manual techniques;Manual lymph drainage;Compression bandaging   PT Next Visit Plan  Cont to review hand bandage and cont manual  lymph drainage.   Consulted and Agree with Plan of Care Patient        Problem List Patient Active Problem List   Diagnosis Date Noted  . Yeast infection of the skin 01/20/2015  . Heartburn 01/13/2015  . Dehydration 12/30/2014  . Genetic testing 12/15/2014  . Vaginal dryness 12/09/2014  . Family history of breast cancer   . Breast cancer of upper-outer quadrant of right female breast 11/14/2014  . Heart palpitations   . Obesity (BMI 35.0-39.9 without comorbidity)   .  Need for pneumococcal vaccine 07/06/2012  . Hyperlipidemia LDL goal <130 07/07/2011  . General medical examination 07/07/2011  . Fibroids 04/01/2011  . History of partial seizures 04/01/2011  . Asthma 04/01/2011  . Mitral valve prolapse 04/01/2011  . Hot flashes 04/01/2011  . Tobacco abuse 04/01/2011  . Essential hypertension 04/01/2011    Otelia Limes, PTA 05/11/2015, 12:08 PM  [place], [place], [address] Phone: [phone]   Fax:  [phone]

## 2015-05-12 ENCOUNTER — Ambulatory Visit
Admission: RE | Admit: 2015-05-12 | Discharge: 2015-05-12 | Disposition: A | Discharge status: Home / Self Care | Payer: BLUE CROSS/BLUE SHIELD | Source: Ambulatory Visit | Attending: Radiation Oncology | Admitting: Radiation Oncology

## 2015-05-12 ENCOUNTER — Encounter: Payer: Self-pay | Admitting: Radiation Oncology

## 2015-05-12 DIAGNOSIS — Z510 Encounter for antineoplastic radiation therapy: Secondary | ICD-10-CM | POA: Diagnosis not present

## 2015-05-13 ENCOUNTER — Ambulatory Visit
Admission: RE | Admit: 2015-05-13 | Discharge: 2015-05-13 | Disposition: A | Discharge status: Home / Self Care | Payer: BLUE CROSS/BLUE SHIELD | Source: Ambulatory Visit | Attending: Radiation Oncology | Admitting: Radiation Oncology

## 2015-05-13 ENCOUNTER — Ambulatory Visit: Payer: BLUE CROSS/BLUE SHIELD

## 2015-05-13 ENCOUNTER — Encounter: Payer: Self-pay | Admitting: Radiation Oncology

## 2015-05-13 VITALS — BP 136/65 | HR 67 | Temp 97.7°F | Resp 12 | Wt 212.6 lb

## 2015-05-13 DIAGNOSIS — R293 Abnormal posture: Secondary | ICD-10-CM

## 2015-05-13 DIAGNOSIS — M25611 Stiffness of right shoulder, not elsewhere classified: Secondary | ICD-10-CM

## 2015-05-13 DIAGNOSIS — Z510 Encounter for antineoplastic radiation therapy: Secondary | ICD-10-CM | POA: Diagnosis not present

## 2015-05-13 DIAGNOSIS — C50411 Malignant neoplasm of upper-outer quadrant of right female breast: Secondary | ICD-10-CM

## 2015-05-13 DIAGNOSIS — M25511 Pain in right shoulder: Secondary | ICD-10-CM

## 2015-05-13 DIAGNOSIS — Z9189 Other specified personal risk factors, not elsewhere classified: Secondary | ICD-10-CM

## 2015-05-13 DIAGNOSIS — C50911 Malignant neoplasm of unspecified site of right female breast: Secondary | ICD-10-CM

## 2015-05-13 DIAGNOSIS — I890 Lymphedema, not elsewhere classified: Secondary | ICD-10-CM

## 2015-05-13 NOTE — Progress Notes (Signed)
PAIN: She is currently in no pain.  SKIN: Pt right breast- positive for Hyperpigmentation, "shooting pain."  Pt reports edema right lower extremity..  Pt continues to apply Radiaplex as directed.  BP 136/65 mmHg  Pulse 67  Temp(Src) 97.7 F (36.5 C) (Oral)  Resp 12  Wt 212 lb 9.6 oz (96.435 kg)  SpO2 100%  LMP 10/10/1996 Wt Readings from Last 3 Encounters:  05/13/15 212 lb 9.6 oz (96.435 kg)  05/04/15 210 lb 11.2 oz (95.573 kg)  04/27/15 216 lb 3.2 oz (98.068 kg)

## 2015-05-13 NOTE — Therapy (Signed)
[place], [place], [address] Phone: [phone]   Fax:  [phone]  Physical Therapy Treatment  Patient Details  Name: Katie Jacobson MRN: [id number] Date of Birth: 24-Apr-1956 Referring Provider:  Rolm Bookbinder, MD  Encounter Date: 05/13/2015      PT End of Session - 05/13/15 1030    Visit Number 12   Number of Visits 16   Date for PT Re-Evaluation 05/29/15   PT Start Time 1024   PT Stop Time 1104   PT Time Calculation (min) 40 min   Activity Tolerance Patient tolerated treatment well   Behavior During Therapy [facility] for tasks assessed/performed      Past Medical History  Diagnosis Date  . Osteoporosis   . Mitral valve prolapse 1990  . Breast cancer of upper-outer quadrant of right female breast 11/14/2014  . History of seizures     as a child - unknown cause - states was never on anticonvulsants  . History of kidney stones   . Seasonal allergies   . Hypertension     states under control with meds., has been on med. x "years"  . History of chemotherapy     finished chemo 02/03/2015  . Breast cancer, right breast     Past Surgical History  Procedure Laterality Date  . Appendectomy  1976  . Abdominal hysterectomy  1998    partial  . Tonsillectomy  1970s  . Knee arthroscopy Left 03/26/2008  . Tubal ligation  1996  . Salivary stone removal Right 1972  . Colonoscopy    . Portacath placement N/A 11/27/2014    Procedure: INSERTION PORT-A-CATH;  Surgeon: Rolm Bookbinder, MD;  Location: [place];  Service: General;  Laterality: N/A;  . Plantar's wart excision Left     x 2  . Radioactive seed guided mastectomy with axillary sentinel lymph node biopsy Right 03/02/2015    Procedure: RADIOACTIVE SEED GUIDED RIGHT PARTIAL MASTECTOMY WITH RIGHT AXILLARY SENTINEL LYMPH NODE BIOPSY;  Surgeon: Rolm Bookbinder, MD;  Location: [place];  Service: General;   Laterality: Right;  . Port-a-cath removal Left 04/06/2015    Procedure: REMOVAL PORT-A-CATH;  Surgeon: Rolm Bookbinder, MD;  Location: [place];  Service: General;  Laterality: Left;    There were no vitals filed for this visit.  Visit Diagnosis:  Stiffness of joint, shoulder region, right  Lymphedema  Right shoulder pain  Abnormal posture  At risk for lymphedema  Breast cancer, right breast      Subjective Assessment - 05/13/15 1029    Subjective Wore your bandage until that night from last session but didnt put it back on yesterday.    Currently in Pain? No/denies                         [facility] Adult PT Treatment/Exercise - 05/13/15 0001    Manual Therapy   Manual Lymphatic Drainage (MLD) In Supine: Short neck, diaphragmatic breathing, Lt axilla, Lt upper quadrant, and Rt inguinal nodes, anterior inter-axillary and Rt axillo-inguinal anastomosis, and Rt UE from dorsal hand to lateral shoulder.   Compression Bandaging Lotion applied.  Right hand with stockinette, Elastomull to all fingers, 1/2 Artiflex, and 1-6 cm. bandage around hand and wrist only reviewing with pt throughout.                   Short Term Clinic Goals - 03/31/15 1759    CC  Short Term Goal  #1   Title Short term goals = long term goals             Long Term Clinic Goals - 05/11/15 1206    CC Long Term Goal  #4   Title Patient will decrease the DASH score to < 35   to demonstrate increased functional use of upper extremity  Pts DASH score 29.55 today 05/11/15   Status Achieved         Head and Neck Clinic Goals - 04/20/15 2156    Patient will be able to verbalize understanding of lymphedema risk and availability of treatment for this condition.    Status --           Plan - 05/13/15 1104    Clinical Impression Statement Pt continues to make good progress reporting relief after treatment. She is going to be applying for [employer] to get  compression garment funding and then we will have her get measured if approved.   Pt will benefit from skilled therapeutic intervention in order to improve on the following deficits Decreased range of motion;Increased edema;Impaired UE functional use;Decreased knowledge of precautions;Pain;Decreased strength;Increased fascial restricitons   Rehab Potential Good   Clinical Impairments Affecting Rehab Potential previous chemo; starts radiation beinning of July 8   PT Frequency 2x / week   PT Duration 4 weeks   PT Treatment/Interventions Passive range of motion;Manual techniques;Manual lymph drainage;Compression bandaging   PT Next Visit Plan  Cont to review hand bandage and cont manual lymph drainage. See if pt got approved for [employer] funding and if so have her get measured.    Consulted and Agree with Plan of Care Patient        Problem List Patient Active Problem List   Diagnosis Date Noted  . Yeast infection of the skin 01/20/2015  . Heartburn 01/13/2015  . Dehydration 12/30/2014  . Genetic testing 12/15/2014  . Vaginal dryness 12/09/2014  . Family history of breast cancer   . Breast cancer of upper-outer quadrant of right female breast 11/14/2014  . Heart palpitations   . Obesity (BMI 35.0-39.9 without comorbidity)   . Need for pneumococcal vaccine 07/06/2012  . Hyperlipidemia LDL goal <130 07/07/2011  . General medical examination 07/07/2011  . Fibroids 04/01/2011  . History of partial seizures 04/01/2011  . Asthma 04/01/2011  . Mitral valve prolapse 04/01/2011  . Hot flashes 04/01/2011  . Tobacco abuse 04/01/2011  . Essential hypertension 04/01/2011    Collie Siad Ann,PTA 05/13/2015, 11:09 AM  [place], [place], [address] Phone: [phone]   Fax:  [phone]

## 2015-05-13 NOTE — Progress Notes (Signed)
   Weekly Management Note: Outpatient    ICD-9-CM ICD-10-CM   1. Breast cancer of upper-outer quadrant of right female breast 174.4 C50.411     Current Dose:  36 Gy  Projected Dose: 60 Gy   Narrative:  The patient presents for routine under treatment assessment.  CBCT/MVCT images/Port film x-rays were reviewed.  The chart was checked. PAIN:  She is currently in no pain.  SKIN:  Pt right breast- positive for Hyperpigmentation, "shooting pain." Pt reports edema of her right upper extremity. Pt continues to apply Radiaplex as directed.  Physical Findings:  weight is 212 lb 9.6 oz (96.435 kg). Her oral temperature is 97.7 F (36.5 C). Her blood pressure is 136/65 and her pulse is 67. Her respiration is 12 and oxygen saturation is 100%.   Wt Readings from Last 3 Encounters:  05/13/15 212 lb 9.6 oz (96.435 kg)  05/04/15 210 lb 11.2 oz (95.573 kg)  04/27/15 216 lb 3.2 oz (98.068 kg)   Compressive bandaging over the right hand for distal edema applied by physical therapy. Diffuse hyperpigmentation over the right breast. Skin is intact.  Impression:  The patient is tolerating radiotherapy.  Plan:  Continue radiotherapy as planned.    This document serves as a record of services personally performed by Eppie Gibson, MD. It was created on her behalf by Darcus Austin, a trained medical scribe. The creation of this record is based on the scribe's personal observations and the provider's statements to them. This document has been checked and approved by the attending provider.     ________________________________   Eppie Gibson, M.D.

## 2015-05-14 ENCOUNTER — Ambulatory Visit
Admission: RE | Admit: 2015-05-14 | Discharge: 2015-05-14 | Disposition: A | Discharge status: Home / Self Care | Payer: BLUE CROSS/BLUE SHIELD | Source: Ambulatory Visit | Attending: Radiation Oncology | Admitting: Radiation Oncology

## 2015-05-14 DIAGNOSIS — Z510 Encounter for antineoplastic radiation therapy: Secondary | ICD-10-CM | POA: Diagnosis not present

## 2015-05-15 ENCOUNTER — Ambulatory Visit
Admission: RE | Admit: 2015-05-15 | Discharge: 2015-05-15 | Disposition: A | Discharge status: Home / Self Care | Payer: BLUE CROSS/BLUE SHIELD | Source: Ambulatory Visit | Attending: Radiation Oncology | Admitting: Radiation Oncology

## 2015-05-15 DIAGNOSIS — Z510 Encounter for antineoplastic radiation therapy: Secondary | ICD-10-CM | POA: Diagnosis not present

## 2015-05-18 ENCOUNTER — Ambulatory Visit
Admission: RE | Admit: 2015-05-18 | Discharge: 2015-05-18 | Disposition: A | Discharge status: Home / Self Care | Payer: BLUE CROSS/BLUE SHIELD | Source: Ambulatory Visit | Attending: Radiation Oncology | Admitting: Radiation Oncology

## 2015-05-18 ENCOUNTER — Encounter: Payer: Self-pay | Admitting: Radiation Oncology

## 2015-05-18 ENCOUNTER — Ambulatory Visit: Payer: BLUE CROSS/BLUE SHIELD

## 2015-05-18 VITALS — BP 123/63 | HR 72 | Temp 97.4°F | Resp 12 | Wt 212.9 lb

## 2015-05-18 DIAGNOSIS — R293 Abnormal posture: Secondary | ICD-10-CM

## 2015-05-18 DIAGNOSIS — C50911 Malignant neoplasm of unspecified site of right female breast: Secondary | ICD-10-CM

## 2015-05-18 DIAGNOSIS — M25611 Stiffness of right shoulder, not elsewhere classified: Secondary | ICD-10-CM

## 2015-05-18 DIAGNOSIS — Z510 Encounter for antineoplastic radiation therapy: Secondary | ICD-10-CM | POA: Diagnosis not present

## 2015-05-18 DIAGNOSIS — C50411 Malignant neoplasm of upper-outer quadrant of right female breast: Secondary | ICD-10-CM

## 2015-05-18 DIAGNOSIS — I890 Lymphedema, not elsewhere classified: Secondary | ICD-10-CM

## 2015-05-18 DIAGNOSIS — Z9189 Other specified personal risk factors, not elsewhere classified: Secondary | ICD-10-CM

## 2015-05-18 DIAGNOSIS — M25511 Pain in right shoulder: Secondary | ICD-10-CM

## 2015-05-18 NOTE — Therapy (Signed)
[place], [place], [address] Phone: [phone]   Fax:  [phone]  Physical Therapy Treatment  Patient Details  Name: Katie Jacobson MRN: [id number] Date of Birth: May 28, 1956 Referring Provider:  Rolm Bookbinder, MD  Encounter Date: 05/18/2015      PT End of Session - 05/18/15 1031    Visit Number 13   Number of Visits 16   Date for PT Re-Evaluation 05/29/15   PT Start Time 1022   PT Stop Time 1102   PT Time Calculation (min) 40 min   Activity Tolerance Patient tolerated treatment well   Behavior During Therapy [facility] for tasks assessed/performed      Past Medical History  Diagnosis Date  . Osteoporosis   . Mitral valve prolapse 1990  . Breast cancer of upper-outer quadrant of right female breast 11/14/2014  . History of seizures     as a child - unknown cause - states was never on anticonvulsants  . History of kidney stones   . Seasonal allergies   . Hypertension     states under control with meds., has been on med. x "years"  . History of chemotherapy     finished chemo 02/03/2015  . Breast cancer, right breast     Past Surgical History  Procedure Laterality Date  . Appendectomy  1976  . Abdominal hysterectomy  1998    partial  . Tonsillectomy  1970s  . Knee arthroscopy Left 03/26/2008  . Tubal ligation  1996  . Salivary stone removal Right 1972  . Colonoscopy    . Portacath placement N/A 11/27/2014    Procedure: INSERTION PORT-A-CATH;  Surgeon: Rolm Bookbinder, MD;  Location: [place];  Service: General;  Laterality: N/A;  . Plantar's wart excision Left     x 2  . Radioactive seed guided mastectomy with axillary sentinel lymph node biopsy Right 03/02/2015    Procedure: RADIOACTIVE SEED GUIDED RIGHT PARTIAL MASTECTOMY WITH RIGHT AXILLARY SENTINEL LYMPH NODE BIOPSY;  Surgeon: Rolm Bookbinder, MD;  Location: [place];  Service: General;   Laterality: Right;  . Port-a-cath removal Left 04/06/2015    Procedure: REMOVAL PORT-A-CATH;  Surgeon: Rolm Bookbinder, MD;  Location: [place];  Service: General;  Laterality: Left;    There were no vitals filed for this visit.  Visit Diagnosis:  Stiffness of joint, shoulder region, right  Lymphedema  Right shoulder pain  Abnormal posture  At risk for lymphedema  Breast cancer, right breast      Subjective Assessment - 05/18/15 1027    Subjective Met with an [employer] representative on Friday and they are working on getting me a grant for my compression garments. My Rt hand still feels very tender to touch, but it feels better after I wear the bandage. I need to wear it more.    Currently in Pain? No/denies                         [facility] Adult PT Treatment/Exercise - 05/18/15 0001    Manual Therapy   Manual Lymphatic Drainage (MLD) In Supine: Short neck, diaphragmatic breathing, Lt axilla, Lt upper quadrant, and Rt inguinal nodes, anterior inter-axillary and Rt axillo-inguinal anastomosis, and Rt UE from dorsal hand to lateral shoulder.   Compression Bandaging Lotion applied.  Right hand with stockinette, Elastomull to all fingers, 1/2 Artiflex, and 1-6 cm. bandage around hand and wrist only reviewing with pt throughout.  Short Term Clinic Goals - 03/31/15 1759    CC Short Term Goal  #1   Title Short term goals = long term goals             Long Term Clinic Goals - 05/11/15 1206    CC Long Term Goal  #4   Title Patient will decrease the DASH score to < 35   to demonstrate increased functional use of upper extremity  Pts DASH score 29.55 today 05/11/15   Status Achieved         Head and Neck Clinic Goals - 04/20/15 2156    Patient will be able to verbalize understanding of lymphedema risk and availability of treatment for this condition.    Status --           Plan - 05/18/15 1103    Clinical  Impression Statement Discussed with pt being consistent with wearing bandaging over next week to see if this alleviates hand tenderness and she reported she would and knows she needs to do this. She is very eager to get compression garments so she doesnt have to keep rebandaging.    Pt will benefit from skilled therapeutic intervention in order to improve on the following deficits Decreased range of motion;Increased edema;Impaired UE functional use;Decreased knowledge of precautions;Pain;Decreased strength;Increased fascial restricitons   Rehab Potential Good   Clinical Impairments Affecting Rehab Potential previous chemo; starts radiation beinning of July 8   PT Frequency 2x / week   PT Duration 4 weeks   PT Treatment/Interventions Passive range of motion;Manual techniques;Manual lymph drainage;Compression bandaging   PT Next Visit Plan  Assess next visit. Cont to review hand bandage and cont manual lymph drainage. Cont to assess if pt got approved for [employer] and if so have her get measured.    Consulted and Agree with Plan of Care Patient        Problem List Patient Active Problem List   Diagnosis Date Noted  . Yeast infection of the skin 01/20/2015  . Heartburn 01/13/2015  . Dehydration 12/30/2014  . Genetic testing 12/15/2014  . Vaginal dryness 12/09/2014  . Family history of breast cancer   . Breast cancer of upper-outer quadrant of right female breast 11/14/2014  . Heart palpitations   . Obesity (BMI 35.0-39.9 without comorbidity)   . Need for pneumococcal vaccine 07/06/2012  . Hyperlipidemia LDL goal <130 07/07/2011  . General medical examination 07/07/2011  . Fibroids 04/01/2011  . History of partial seizures 04/01/2011  . Asthma 04/01/2011  . Mitral valve prolapse 04/01/2011  . Hot flashes 04/01/2011  . Tobacco abuse 04/01/2011  . Essential hypertension 04/01/2011    Otelia Limes, PTA 05/18/2015, 11:15 AM  [place], [place], [address] Phone: [phone]   Fax:  [phone]

## 2015-05-18 NOTE — Progress Notes (Signed)
   Weekly Management Note: Outpatient    ICD-9-CM ICD-10-CM   1. Breast cancer of upper-outer quadrant of right female breast 174.4 C50.411     Current Dose:  42 Gy  Projected Dose: 60 Gy   Narrative:  The patient presents for routine under treatment assessment.  CBCT/MVCT images/Port film x-rays were reviewed.  The chart was checked. No new complaints   Physical Findings:  weight is 212 lb 14.4 oz (96.571 kg). Her oral temperature is 97.4 F (36.3 C). Her blood pressure is 123/63 and her pulse is 72. Her respiration is 12 and oxygen saturation is 100%.   Wt Readings from Last 3 Encounters:  05/18/15 212 lb 14.4 oz (96.571 kg)  05/13/15 212 lb 9.6 oz (96.435 kg)  05/04/15 210 lb 11.2 oz (95.573 kg)  Persistent hyperpigmentation, right breast    Impression:  The patient is tolerating radiotherapy.  Plan:  Continue radiotherapy as planned.     ________________________________   Eppie Gibson, M.D.

## 2015-05-18 NOTE — Progress Notes (Signed)
PAIN: She rates her pain as a 4 on a scale of 0-10. intermittent over left leg- when walking. SKIN: Pt right breast- positive for Hyperpigmentation and breast tenderness.  Pt reports edema over right hand. Hand wrapped.  Pt continues to apply Radiaplex as directed.  BP 123/63 mmHg  Pulse 72  Temp(Src) 97.4 F (36.3 C) (Oral)  Resp 12  Wt 212 lb 14.4 oz (96.571 kg)  SpO2 100%  LMP 10/10/1996 Wt Readings from Last 3 Encounters:  05/18/15 212 lb 14.4 oz (96.571 kg)  05/13/15 212 lb 9.6 oz (96.435 kg)  05/04/15 210 lb 11.2 oz (95.573 kg)

## 2015-05-19 ENCOUNTER — Ambulatory Visit
Admission: RE | Admit: 2015-05-19 | Discharge: 2015-05-19 | Disposition: A | Discharge status: Home / Self Care | Payer: BLUE CROSS/BLUE SHIELD | Source: Ambulatory Visit | Attending: Radiation Oncology | Admitting: Radiation Oncology

## 2015-05-19 DIAGNOSIS — Z510 Encounter for antineoplastic radiation therapy: Secondary | ICD-10-CM | POA: Diagnosis not present

## 2015-05-20 ENCOUNTER — Ambulatory Visit
Admission: RE | Admit: 2015-05-20 | Discharge: 2015-05-20 | Disposition: A | Discharge status: Home / Self Care | Payer: BLUE CROSS/BLUE SHIELD | Source: Ambulatory Visit | Attending: Radiation Oncology | Admitting: Radiation Oncology

## 2015-05-20 ENCOUNTER — Ambulatory Visit: Payer: BLUE CROSS/BLUE SHIELD

## 2015-05-20 DIAGNOSIS — M25511 Pain in right shoulder: Secondary | ICD-10-CM

## 2015-05-20 DIAGNOSIS — Z510 Encounter for antineoplastic radiation therapy: Secondary | ICD-10-CM | POA: Diagnosis not present

## 2015-05-20 DIAGNOSIS — M25611 Stiffness of right shoulder, not elsewhere classified: Secondary | ICD-10-CM

## 2015-05-20 DIAGNOSIS — Z9189 Other specified personal risk factors, not elsewhere classified: Secondary | ICD-10-CM

## 2015-05-20 DIAGNOSIS — I890 Lymphedema, not elsewhere classified: Secondary | ICD-10-CM

## 2015-05-20 DIAGNOSIS — C50911 Malignant neoplasm of unspecified site of right female breast: Secondary | ICD-10-CM

## 2015-05-20 DIAGNOSIS — R293 Abnormal posture: Secondary | ICD-10-CM

## 2015-05-20 NOTE — Therapy (Signed)
[place], [place], [address] Phone: [phone]   Fax:  [phone]  Physical Therapy Treatment  Patient Details  Name: Katie Jacobson MRN: [id number] Date of Birth: [birth year] Referring Provider:  Rolm Bookbinder, MD  Encounter Date: 05/20/2015      PT End of Session - 05/20/15 1031    Visit Number 14   Number of Visits 16   Date for PT Re-Evaluation 05/29/15   PT Start Time 1018   PT Stop Time 1103   PT Time Calculation (min) 45 min   Activity Tolerance Patient tolerated treatment well   Behavior During Therapy [facility] for tasks assessed/performed      Past Medical History  Diagnosis Date  . Osteoporosis   . Mitral valve prolapse 1990  . Breast cancer of upper-outer quadrant of right female breast 11/14/2014  . History of seizures     as a child - unknown cause - states was never on anticonvulsants  . History of kidney stones   . Seasonal allergies   . Hypertension     states under control with meds., has been on med. x "years"  . History of chemotherapy     finished chemo 02/03/2015  . Breast cancer, right breast     Past Surgical History  Procedure Laterality Date  . Appendectomy  1976  . Abdominal hysterectomy  1998    partial  . Tonsillectomy  1970s  . Knee arthroscopy Left 03/26/2008  . Tubal ligation  1996  . Salivary stone removal Right 1972  . Colonoscopy    . Portacath placement N/A 11/27/2014    Procedure: INSERTION PORT-A-CATH;  Surgeon: Rolm Bookbinder, MD;  Location: [place];  Service: General;  Laterality: N/A;  . Plantar's wart excision Left     x 2  . Radioactive seed guided mastectomy with axillary sentinel lymph node biopsy Right 03/02/2015    Procedure: RADIOACTIVE SEED GUIDED RIGHT PARTIAL MASTECTOMY WITH RIGHT AXILLARY SENTINEL LYMPH NODE BIOPSY;  Surgeon: Rolm Bookbinder, MD;  Location: [place];  Service: General;   Laterality: Right;  . Port-a-cath removal Left 04/06/2015    Procedure: REMOVAL PORT-A-CATH;  Surgeon: Rolm Bookbinder, MD;  Location: [place];  Service: General;  Laterality: Left;    There were no vitals filed for this visit.  Visit Diagnosis:  Stiffness of joint, shoulder region, right  Lymphedema  Right shoulder pain  Abnormal posture  At risk for lymphedema  Breast cancer, right breast      Subjective Assessment - 05/20/15 1024    Subjective I fell down my last 2 or 3 stairs this morning, don't even know how! Landed on my Rt forearm and my 2 middle fingers feel strained, but only a 2/10. Just think I was rushing.    Currently in Pain? Yes   Pain Score 2    Pain Location Finger (Comment which one)   Pain Orientation Right   Pain Descriptors / Indicators Other (Comment)  Just feels strained   Pain Type Acute pain   Pain Onset Today   Aggravating Factors  fell    Pain Relieving Factors don't know yet               LYMPHEDEMA/ONCOLOGY QUESTIONNAIRE - 05/20/15 1027    Right Upper Extremity Lymphedema   10 cm Proximal to Olecranon Process 36.6 cm   Olecranon Process 28.4 cm   10 cm Proximal to Ulnar Styloid Process 26.5 cm  Just Proximal to Ulnar Styloid Process 17.4 cm   Across Hand at [employer] 20.4 cm   At [place] of 2nd Digit 6.5 cm                  [facility] Adult PT Treatment/Exercise - 05/20/15 0001    Manual Therapy   Manual Lymphatic Drainage (MLD) In Supine: Short neck, diaphragmatic breathing, Lt axilla, Lt upper quadrant, and Rt inguinal nodes, anterior inter-axillary and Rt axillo-inguinal anastomosis, and Rt UE from dorsal hand to lateral shoulder.   Compression Bandaging Lotion applied.  Right hand with stockinette, Elastomull to all fingers, 1/2 Artiflex, and 1-6 cm. bandage around hand and wrist only reviewing with pt throughout.                   Short Term Clinic Goals - 03/31/15 1759    CC Short  Term Goal  #1   Title Short term goals = long term goals             Long Term Clinic Goals - 05/20/15 1033    CC Long Term Goal  #3   Title Patient will have a circumferential reduction of   2        cm at    10  cm above the         ulnar styloid   reduced to 26.5 cm today 05/20/15   Status Achieved         Head and Neck Clinic Goals - 04/20/15 2156    Patient will be able to verbalize understanding of lymphedema risk and availability of treatment for this condition.    Status --           Plan - 05/20/15 1031    Clinical Impression Statement Pt wrapped her hand yesterday and her circumference measurements were reduced. Pt has spoken to the [employer] repersentative and is just waiting to hear back from them regarding funding.    Pt will benefit from skilled therapeutic intervention in order to improve on the following deficits Decreased range of motion;Increased edema;Impaired UE functional use;Decreased knowledge of precautions;Pain;Decreased strength;Increased fascial restricitons   Rehab Potential Good   Clinical Impairments Affecting Rehab Potential previous chemo; starts radiation beinning of July 8   PT Frequency 2x / week   PT Duration 4 weeks   PT Treatment/Interventions Passive range of motion;Manual techniques;Manual lymph drainage;Compression bandaging   PT Next Visit Plan Cont to review hand bandage and cont manual lymph drainage. Cont to assess if pt got approved for [employer] and if so have her get measured.    Consulted and Agree with Plan of Care Patient        Problem List Patient Active Problem List   Diagnosis Date Noted  . Yeast infection of the skin 01/20/2015  . Heartburn 01/13/2015  . Dehydration 12/30/2014  . Genetic testing 12/15/2014  . Vaginal dryness 12/09/2014  . Family history of breast cancer   . Breast cancer of upper-outer quadrant of right female breast 11/14/2014  . Heart palpitations   . Obesity (BMI 35.0-39.9 without  comorbidity)   . Need for pneumococcal vaccine 07/06/2012  . Hyperlipidemia LDL goal <130 07/07/2011  . General medical examination 07/07/2011  . Fibroids 04/01/2011  . History of partial seizures 04/01/2011  . Asthma 04/01/2011  . Mitral valve prolapse 04/01/2011  . Hot flashes 04/01/2011  . Tobacco abuse 04/01/2011  . Essential hypertension 04/01/2011    Katie Jacobson, PTA 05/20/2015,  10:35 AM  [place], [place], [address] Phone: [phone]   Fax:  [phone]

## 2015-05-21 ENCOUNTER — Ambulatory Visit
Admission: RE | Admit: 2015-05-21 | Discharge: 2015-05-21 | Disposition: A | Discharge status: Home / Self Care | Payer: BLUE CROSS/BLUE SHIELD | Source: Ambulatory Visit | Attending: Radiation Oncology | Admitting: Radiation Oncology

## 2015-05-21 DIAGNOSIS — Z510 Encounter for antineoplastic radiation therapy: Secondary | ICD-10-CM | POA: Diagnosis not present

## 2015-05-22 ENCOUNTER — Ambulatory Visit
Admission: RE | Admit: 2015-05-22 | Discharge: 2015-05-22 | Disposition: A | Discharge status: Home / Self Care | Payer: BLUE CROSS/BLUE SHIELD | Source: Ambulatory Visit | Attending: Radiation Oncology | Admitting: Radiation Oncology

## 2015-05-22 DIAGNOSIS — Z510 Encounter for antineoplastic radiation therapy: Secondary | ICD-10-CM | POA: Diagnosis not present

## 2015-05-25 ENCOUNTER — Ambulatory Visit
Admission: RE | Admit: 2015-05-25 | Discharge: 2015-05-25 | Disposition: A | Discharge status: Home / Self Care | Payer: BLUE CROSS/BLUE SHIELD | Source: Ambulatory Visit | Attending: Radiation Oncology | Admitting: Radiation Oncology

## 2015-05-25 ENCOUNTER — Ambulatory Visit: Payer: BLUE CROSS/BLUE SHIELD

## 2015-05-25 VITALS — BP 134/73 | HR 74 | Temp 98.2°F | Wt 212.8 lb

## 2015-05-25 DIAGNOSIS — M25511 Pain in right shoulder: Secondary | ICD-10-CM

## 2015-05-25 DIAGNOSIS — C50911 Malignant neoplasm of unspecified site of right female breast: Secondary | ICD-10-CM

## 2015-05-25 DIAGNOSIS — C50411 Malignant neoplasm of upper-outer quadrant of right female breast: Secondary | ICD-10-CM | POA: Insufficient documentation

## 2015-05-25 DIAGNOSIS — Z510 Encounter for antineoplastic radiation therapy: Secondary | ICD-10-CM | POA: Diagnosis not present

## 2015-05-25 DIAGNOSIS — I890 Lymphedema, not elsewhere classified: Secondary | ICD-10-CM

## 2015-05-25 DIAGNOSIS — Z9189 Other specified personal risk factors, not elsewhere classified: Secondary | ICD-10-CM

## 2015-05-25 DIAGNOSIS — R293 Abnormal posture: Secondary | ICD-10-CM

## 2015-05-25 DIAGNOSIS — M25611 Stiffness of right shoulder, not elsewhere classified: Secondary | ICD-10-CM

## 2015-05-25 MED ORDER — RADIAPLEXRX EX GEL
Freq: Once | CUTANEOUS | Status: AC
  Administered 2015-05-25: 15:00:00 via TOPICAL

## 2015-05-25 NOTE — Progress Notes (Signed)
Weekly assessment of radiation to right breast.Completed 26 of 30 treatments.Marked hyperpigmentation.Has some numbness.Continue application of radiaplex.Right hand wrapped per physical therapy.has some swelling.Patient did state she shaved right axilla.No evidence of broken skin.Infomed to wait until skin has healed post completion of radiation before shaving again to prevent bleeding or infection.

## 2015-05-25 NOTE — Progress Notes (Signed)
   Weekly Management Note: Outpatient    ICD-9-CM ICD-10-CM   1. Breast cancer of upper-outer quadrant of right female breast 174.4 C50.411 hyaluronate sodium (RADIAPLEXRX) gel    Current Dose:  52 Gy  Projected Dose: 60 Gy   Narrative:  The patient presents for routine under treatment assessment.  CBCT/MVCT images/Port film x-rays were reviewed.  The chart was checked.  Skin darker  Physical Findings:  weight is 212 lb 12.8 oz (96.525 kg). Her temperature is 98.2 F (36.8 C). Her blood pressure is 134/73 and her pulse is 74.   Wt Readings from Last 3 Encounters:  05/25/15 212 lb 12.8 oz (96.525 kg)  05/18/15 212 lb 14.4 oz (96.571 kg)  05/13/15 212 lb 9.6 oz (96.435 kg)  diffuse hyperpigmentation, right breast. Skin intact.    Impression:  The patient is tolerating radiotherapy.  Plan:  Continue radiotherapy as planned.   F/u in 41mo.   ________________________________   Eppie Gibson, M.D.

## 2015-05-25 NOTE — Addendum Note (Signed)
Encounter addended by: Jacqulyn Liner, RN on: 05/25/2015  2:59 PM<BR>     Documentation filed: Inpatient MAR

## 2015-05-25 NOTE — Therapy (Signed)
[place], [place], [address] Phone: [phone]   Fax:  [phone]  Physical Therapy Treatment  Patient Details  Name: Katie Jacobson MRN: [id number] Date of Birth: 1956/08/24 Referring Provider:  Rolm Bookbinder, MD  Encounter Date: 05/25/2015      PT End of Session - 05/25/15 1109    Visit Number 15   Number of Visits 16   Date for PT Re-Evaluation 05/29/15   PT Start Time 1022   PT Stop Time 1108   PT Time Calculation (min) 46 min   Activity Tolerance Patient tolerated treatment well   Behavior During Therapy [facility] for tasks assessed/performed      Past Medical History  Diagnosis Date  . Osteoporosis   . Mitral valve prolapse 1990  . Breast cancer of upper-outer quadrant of right female breast 11/14/2014  . History of seizures     as a child - unknown cause - states was never on anticonvulsants  . History of kidney stones   . Seasonal allergies   . Hypertension     states under control with meds., has been on med. x "years"  . History of chemotherapy     finished chemo 02/03/2015  . Breast cancer, right breast     Past Surgical History  Procedure Laterality Date  . Appendectomy  1976  . Abdominal hysterectomy  1998    partial  . Tonsillectomy  1970s  . Knee arthroscopy Left 03/26/2008  . Tubal ligation  1996  . Salivary stone removal Right 1972  . Colonoscopy    . Portacath placement N/A 11/27/2014    Procedure: INSERTION PORT-A-CATH;  Surgeon: Rolm Bookbinder, MD;  Location: [place];  Service: General;  Laterality: N/A;  . Plantar's wart excision Left     x 2  . Radioactive seed guided mastectomy with axillary sentinel lymph node biopsy Right 03/02/2015    Procedure: RADIOACTIVE SEED GUIDED RIGHT PARTIAL MASTECTOMY WITH RIGHT AXILLARY SENTINEL LYMPH NODE BIOPSY;  Surgeon: Rolm Bookbinder, MD;  Location: [place];  Service: General;   Laterality: Right;  . Port-a-cath removal Left 04/06/2015    Procedure: REMOVAL PORT-A-CATH;  Surgeon: Rolm Bookbinder, MD;  Location: [place];  Service: General;  Laterality: Left;    There were no vitals filed for this visit.  Visit Diagnosis:  Stiffness of joint, shoulder region, right  Lymphedema  Right shoulder pain  Abnormal posture  At risk for lymphedema  Breast cancer, right breast      Subjective Assessment - 05/25/15 1034    Subjective Been wrapping my hand very consistently since I was here last. My Rt hand is still tender and swollen, but  I think the fall made it worse. My Lt knee is hurting more than usual and I know the fall just didn't help that.    Currently in Pain? Yes   Pain Score 4    Pain Location Knee   Pain Orientation Left   Pain Descriptors / Indicators --  "Pops", intermittent   Pain Type Chronic pain   Pain Onset More than a month ago   Aggravating Factors  the fall made it worse, but it's been a problem for a few years, since my knee surgery   Pain Relieving Factors not much right now                         [facility] Adult PT Treatment/Exercise -  05/25/15 0001    Manual Therapy   Manual Lymphatic Drainage (MLD) In Supine: Short neck, superficial and deep abdominals, Lt axilla, Lt upper quadrant, and Rt inguinal nodes, anterior inter-axillary and Rt axillo-inguinal anastomosis, and Rt UE from dorsal hand to lateral shoulder.   Compression Bandaging Lotion applied.  Right hand with stockinette, Elastomull to all fingers, 1/2 Artiflex, and 1-6 cm. bandage around hand and wrist only reviewing with pt throughout.                   Short Term Clinic Goals - 03/31/15 1759    CC Short Term Goal  #1   Title Short term goals = long term goals             Long Term Clinic Goals - 05/20/15 1033    CC Long Term Goal  #3   Title Patient will have a circumferential reduction of   2        cm at    10  cm  above the         ulnar styloid   reduced to 26.5 cm today 05/20/15   Status Achieved         Head and Neck Clinic Goals - 04/20/15 2156    Patient will be able to verbalize understanding of lymphedema risk and availability of treatment for this condition.    Status --           Plan - 05/25/15 1120    Clinical Impression Statement Ms. Stoneman is going to try to find something out about [employer] funding today. Has been rewrapping her hand at home and it seems to be doing well though her dorsal hand did seem a little swollen today probably due to her fall last week. She has met all but 1 goal and verbalizes will be ready for discharge next visit.    Pt will benefit from skilled therapeutic intervention in order to improve on the following deficits Decreased range of motion;Increased edema;Impaired UE functional use;Decreased knowledge of precautions;Pain;Decreased strength;Increased fascial restricitons   Rehab Potential Good   Clinical Impairments Affecting Rehab Potential previous chemo; starts radiation beinning of July 8   PT Frequency 2x / week   PT Duration 4 weeks   PT Treatment/Interventions Passive range of motion;Manual techniques;Manual lymph drainage;Compression bandaging   PT Next Visit Plan Discharge next visit. Instruct husband in self manual lymph drainage if he comes and have pt wrap her hand to assess technique 1 more time.    Consulted and Agree with Plan of Care Patient        Problem List Patient Active Problem List   Diagnosis Date Noted  . Yeast infection of the skin 01/20/2015  . Heartburn 01/13/2015  . Dehydration 12/30/2014  . Genetic testing 12/15/2014  . Vaginal dryness 12/09/2014  . Family history of breast cancer   . Breast cancer of upper-outer quadrant of right female breast 11/14/2014  . Heart palpitations   . Obesity (BMI 35.0-39.9 without comorbidity)   . Need for pneumococcal vaccine 07/06/2012  . Hyperlipidemia LDL goal <130 07/07/2011   . General medical examination 07/07/2011  . Fibroids 04/01/2011  . History of partial seizures 04/01/2011  . Asthma 04/01/2011  . Mitral valve prolapse 04/01/2011  . Hot flashes 04/01/2011  . Tobacco abuse 04/01/2011  . Essential hypertension 04/01/2011    Otelia Limes, PTA 05/25/2015, 11:28 AM  [place], [place], [address] Phone:  [phone]   Fax:  [phone]

## 2015-05-26 ENCOUNTER — Ambulatory Visit
Admission: RE | Admit: 2015-05-26 | Discharge: 2015-05-26 | Disposition: A | Discharge status: Home / Self Care | Payer: BLUE CROSS/BLUE SHIELD | Source: Ambulatory Visit | Attending: Radiation Oncology | Admitting: Radiation Oncology

## 2015-05-26 DIAGNOSIS — Z510 Encounter for antineoplastic radiation therapy: Secondary | ICD-10-CM | POA: Diagnosis not present

## 2015-05-27 ENCOUNTER — Ambulatory Visit: Payer: BLUE CROSS/BLUE SHIELD

## 2015-05-27 ENCOUNTER — Ambulatory Visit
Admission: RE | Admit: 2015-05-27 | Discharge: 2015-05-27 | Disposition: A | Discharge status: Home / Self Care | Payer: BLUE CROSS/BLUE SHIELD | Source: Ambulatory Visit | Attending: Radiation Oncology | Admitting: Radiation Oncology

## 2015-05-27 DIAGNOSIS — Z9189 Other specified personal risk factors, not elsewhere classified: Secondary | ICD-10-CM

## 2015-05-27 DIAGNOSIS — R293 Abnormal posture: Secondary | ICD-10-CM

## 2015-05-27 DIAGNOSIS — M25511 Pain in right shoulder: Secondary | ICD-10-CM

## 2015-05-27 DIAGNOSIS — Z510 Encounter for antineoplastic radiation therapy: Secondary | ICD-10-CM | POA: Diagnosis not present

## 2015-05-27 DIAGNOSIS — I890 Lymphedema, not elsewhere classified: Secondary | ICD-10-CM

## 2015-05-27 DIAGNOSIS — C50911 Malignant neoplasm of unspecified site of right female breast: Secondary | ICD-10-CM

## 2015-05-27 DIAGNOSIS — M25611 Stiffness of right shoulder, not elsewhere classified: Secondary | ICD-10-CM

## 2015-05-27 NOTE — Therapy (Signed)
[place], [place], [address] Phone: [phone]   Fax:  [phone]  Physical Therapy Treatment  Patient Details  Name: Katie Jacobson MRN: [id number] Date of Birth: 1956-10-02 Referring Provider:  Rolm Bookbinder, MD  Encounter Date: 05/27/2015      PT End of Session - 05/27/15 1031    Visit Number 16   Number of Visits 16   Date for PT Re-Evaluation 05/29/15   PT Start Time 1021   PT Stop Time 1100   PT Time Calculation (min) 39 min   Activity Tolerance Patient tolerated treatment well   Behavior During Therapy [facility] for tasks assessed/performed      Past Medical History  Diagnosis Date  . Osteoporosis   . Mitral valve prolapse 1990  . Breast cancer of upper-outer quadrant of right female breast 11/14/2014  . History of seizures     as a child - unknown cause - states was never on anticonvulsants  . History of kidney stones   . Seasonal allergies   . Hypertension     states under control with meds., has been on med. x "years"  . History of chemotherapy     finished chemo 02/03/2015  . Breast cancer, right breast     Past Surgical History  Procedure Laterality Date  . Appendectomy  1976  . Abdominal hysterectomy  1998    partial  . Tonsillectomy  1970s  . Knee arthroscopy Left 03/26/2008  . Tubal ligation  1996  . Salivary stone removal Right 1972  . Colonoscopy    . Portacath placement N/A 11/27/2014    Procedure: INSERTION PORT-A-CATH;  Surgeon: Rolm Bookbinder, MD;  Location: [place];  Service: General;  Laterality: N/A;  . Plantar's wart excision Left     x 2  . Radioactive seed guided mastectomy with axillary sentinel lymph node biopsy Right 03/02/2015    Procedure: RADIOACTIVE SEED GUIDED RIGHT PARTIAL MASTECTOMY WITH RIGHT AXILLARY SENTINEL LYMPH NODE BIOPSY;  Surgeon: Rolm Bookbinder, MD;  Location: [place];  Service: General;   Laterality: Right;  . Port-a-cath removal Left 04/06/2015    Procedure: REMOVAL PORT-A-CATH;  Surgeon: Rolm Bookbinder, MD;  Location: [place];  Service: General;  Laterality: Left;    There were no vitals filed for this visit.  Visit Diagnosis:  Stiffness of joint, shoulder region, right  Lymphedema  Right shoulder pain  Abnormal posture  At risk for lymphedema  Breast cancer, right breast      Subjective Assessment - 05/27/15 1024    Subjective Feel ready to discharge, overall feel like I'm doing okay. Can't wait to get a sleeve though. Talked to one of the representatives about my paperwork and she reports they are "working on it".    Currently in Pain? No/denies               LYMPHEDEMA/ONCOLOGY QUESTIONNAIRE - 05/27/15 1025    Right Upper Extremity Lymphedema   10 cm Proximal to Olecranon Process 35.7 cm   Olecranon Process 27.8 cm   10 cm Proximal to Ulnar Styloid Process 26.1 cm   Just Proximal to Ulnar Styloid Process 17.7 cm   Across Hand at [employer] 19.5 cm   At [place] of 2nd Digit 6 cm                  [facility] Adult PT Treatment/Exercise - 05/27/15 0001    Manual Therapy  Manual Lymphatic Drainage (MLD) In Supine: Short neck, superficial and deep abdominals, Lt axilla, Lt upper quadrant, and Rt inguinal nodes, anterior inter-axillary and Rt axillo-inguinal anastomosis, and Rt UE from dorsal hand to lateral shoulder.   Compression Bandaging Lotion applied.  Right hand with stockinette, Elastomull to all fingers, 1/2 Artiflex, and 1-6 cm. bandage around hand and wrist only reviewing with pt throughout.                   Short Term Clinic Goals - 03/31/15 1759    CC Short Term Goal  #1   Title Short term goals = long term goals             Long Term Clinic Goals - 05/27/15 2112    CC Long Term Goal  #1   Title Patient will report a decrease in pain by 50% so they can perform daily activities with  greater ease   Status Achieved   CC Long Term Goal  #2   Title Patient will improve shoulder flexion range of motion to 120 degrees to position for radiation treatment   Status Achieved   CC Long Term Goal  #3   Title Patient will have a circumferential reduction of   2        cm at    10  cm above the         ulnar styloid    Status Achieved   CC Long Term Goal  #4   Title Patient will decrease the DASH score to < 35   to demonstrate increased functional use of upper extremity   Status Achieved         Head and Neck Clinic Goals - 04/20/15 2156    Patient will be able to verbalize understanding of lymphedema risk and availability of treatment for this condition.    Status --           Plan - 05/27/15 1031    Clinical Impression Statement Katie Jacobson has done very well with therapy and being compliant with education and follow through at home. She has begun caring out the Maintenance Phase of her treatment and is just awaiting funding through [employer] to get her compression garments. She will continue her self manual lymph drainage and bandaging until that point. She is ready for discharge at this time.    Pt will benefit from skilled therapeutic intervention in order to improve on the following deficits Decreased range of motion;Increased edema;Impaired UE functional use;Decreased knowledge of precautions;Pain;Decreased strength;Increased fascial restricitons   Rehab Potential Good   Clinical Impairments Affecting Rehab Potential previous chemo; starts radiation beinning of July 8   PT Frequency 2x / week   PT Duration 4 weeks   PT Treatment/Interventions Passive range of motion;Manual techniques;Manual lymph drainage;Compression bandaging   PT Next Visit Plan Discharge this visit.    Consulted and Agree with Plan of Care Patient        Problem List Patient Active Problem List   Diagnosis Date Noted  . Yeast infection of the skin 01/20/2015  . Heartburn 01/13/2015  .  Dehydration 12/30/2014  . Genetic testing 12/15/2014  . Vaginal dryness 12/09/2014  . Family history of breast cancer   . Breast cancer of upper-outer quadrant of right female breast 11/14/2014  . Heart palpitations   . Obesity (BMI 35.0-39.9 without comorbidity)   . Need for pneumococcal vaccine 07/06/2012  . Hyperlipidemia LDL goal <130 07/07/2011  . General medical examination 07/07/2011  .  Fibroids 04/01/2011  . History of partial seizures 04/01/2011  . Asthma 04/01/2011  . Mitral valve prolapse 04/01/2011  . Hot flashes 04/01/2011  . Tobacco abuse 04/01/2011  . Essential hypertension 04/01/2011    SALISBURY,DONNA, PTA 05/27/2015, 9:13 PM  [place], [place], [address] Phone: [phone]   Fax:  [phone]     PHYSICAL THERAPY DISCHARGE SUMMARY  Visits from Start of Care: 16  Current functional level related to goals / functional outcomes: All goals met as noted above.   Remaining deficits: Pt. will need to continue self- treatment of edema.   Education / Equipment: Manual lymph drainage, self-bandaging, how to obtain compression garments.  Plan: Patient agrees to discharge.  Patient goals were met. Patient is being discharged due to meeting the stated rehab goals.  ?????   Serafina Royals, PT 05/27/2015 9:15 PM

## 2015-05-28 ENCOUNTER — Ambulatory Visit
Admission: RE | Admit: 2015-05-28 | Discharge: 2015-05-28 | Disposition: A | Discharge status: Home / Self Care | Payer: BLUE CROSS/BLUE SHIELD | Source: Ambulatory Visit | Attending: Radiation Oncology | Admitting: Radiation Oncology

## 2015-05-28 ENCOUNTER — Ambulatory Visit: Payer: BLUE CROSS/BLUE SHIELD

## 2015-05-28 DIAGNOSIS — Z510 Encounter for antineoplastic radiation therapy: Secondary | ICD-10-CM | POA: Diagnosis not present

## 2015-05-29 ENCOUNTER — Ambulatory Visit: Payer: BLUE CROSS/BLUE SHIELD

## 2015-05-29 ENCOUNTER — Ambulatory Visit
Admission: RE | Admit: 2015-05-29 | Discharge: 2015-05-29 | Disposition: A | Discharge status: Home / Self Care | Payer: BLUE CROSS/BLUE SHIELD | Source: Ambulatory Visit | Attending: Radiation Oncology | Admitting: Radiation Oncology

## 2015-05-29 ENCOUNTER — Encounter: Payer: Self-pay | Admitting: Radiation Oncology

## 2015-05-29 DIAGNOSIS — Z510 Encounter for antineoplastic radiation therapy: Secondary | ICD-10-CM | POA: Diagnosis not present

## 2015-06-01 ENCOUNTER — Encounter: Payer: Self-pay | Admitting: *Deleted

## 2015-06-01 ENCOUNTER — Telehealth: Payer: Self-pay | Admitting: *Deleted

## 2015-06-01 ENCOUNTER — Ambulatory Visit: Payer: BLUE CROSS/BLUE SHIELD

## 2015-06-01 NOTE — Telephone Encounter (Signed)
Spoke with patient to follow up after completion of radiation therapy.  She is doing well with no complaints.  She is glad to be done.  Encouraged her to call with any needs or concerns.

## 2015-06-02 ENCOUNTER — Ambulatory Visit: Payer: BLUE CROSS/BLUE SHIELD

## 2015-06-03 ENCOUNTER — Ambulatory Visit: Payer: BLUE CROSS/BLUE SHIELD

## 2015-06-04 ENCOUNTER — Other Ambulatory Visit: Payer: Self-pay | Admitting: Adult Health

## 2015-06-04 DIAGNOSIS — C50411 Malignant neoplasm of upper-outer quadrant of right female breast: Secondary | ICD-10-CM

## 2015-06-07 NOTE — Progress Notes (Signed)
  Radiation Oncology         [phone] ________________________________  Name: Katie Jacobson MRN: [id number]  Date: 05/29/2015  DOB: 02/22/1956  End of Treatment Note  Diagnosis:  Right breast invasive ductal carcinoma, triple negative, clinical T1cN0M0, ypT1b ypN0     Indication for treatment:  curative       Radiation treatment dates:   04/20/2015-05/29/2015  Site/dose:   1) Right Breast / 50 Gy in 25 fractions 2) Right Breast boost / 10 Gy in 5 fractions  Beams/energy:  1) 3D conformal tangents / 10 and 6 MV photons 2) photon boost / 15 and 6 MV photons  Narrative: The patient tolerated radiation treatment relatively well.     Plan: The patient has completed radiation treatment. The patient will return to radiation oncology clinic for routine followup in one month. I advised them to call or return sooner if they have any questions or concerns related to their recovery or treatment.  -----------------------------------  Eppie Gibson, MD

## 2015-06-17 NOTE — Progress Notes (Signed)
05-12-15 [employer] Note Outpatient  Diagnosis: Breast Cancer  The patient's CT images from her free-breathing simulation were reviewed to plan her boost treatment to her right  breast  lumpectomy cavity.  The boost to the lumpectomy cavity will be delivered with 4 photon fields using MLCs for custom blocks again heart and lungs, with 10 and 6 MV photon energy.  This constitutes 4 complex treatment devices. Isodose plan was reviewed and approved. 10 Gy in 5 fractions prescribed.  -----------------------------------  Eppie Gibson, MD

## 2015-06-18 ENCOUNTER — Other Ambulatory Visit (HOSPITAL_BASED_OUTPATIENT_CLINIC_OR_DEPARTMENT_OTHER): Payer: Self-pay

## 2015-06-18 DIAGNOSIS — C50411 Malignant neoplasm of upper-outer quadrant of right female breast: Secondary | ICD-10-CM

## 2015-06-18 LAB — COMPREHENSIVE METABOLIC PANEL (CC13)
ALT: 11 U/L (ref 0–55)
AST: 12 U/L (ref 5–34)
Albumin: 3.4 g/dL — ABNORMAL LOW (ref 3.5–5.0)
Alkaline Phosphatase: 82 U/L (ref 40–150)
Anion Gap: 8 mEq/L (ref 3–11)
BILIRUBIN TOTAL: 0.45 mg/dL (ref 0.20–1.20)
BUN: 13.3 mg/dL (ref 7.0–26.0)
CHLORIDE: 107 meq/L (ref 98–109)
CO2: 27 mEq/L (ref 22–29)
CREATININE: 0.9 mg/dL (ref 0.6–1.1)
Calcium: 9.5 mg/dL (ref 8.4–10.4)
EGFR: 88 mL/min/{1.73_m2} — ABNORMAL LOW (ref >90)
GLUCOSE: 86 mg/dL (ref 70–140)
Potassium: 4.1 mEq/L (ref 3.5–5.1)
SODIUM: 142 meq/L (ref 136–145)
TOTAL PROTEIN: 6.7 g/dL (ref 6.4–8.3)

## 2015-06-18 LAB — CBC WITH DIFFERENTIAL/PLATELET
BASO%: 0.8 % (ref 0.0–2.0)
Basophils Absolute: 0 10*3/uL (ref 0.0–0.1)
EOS%: 2.7 % (ref 0.0–7.0)
Eosinophils Absolute: 0.1 10*3/uL (ref 0.0–0.5)
HCT: 39.7 % (ref 34.8–46.6)
HGB: 13.1 g/dL (ref 11.6–15.9)
LYMPH%: 24.2 % (ref 14.0–49.7)
MCH: 29.5 pg (ref 25.1–34.0)
MCHC: 33.1 g/dL (ref 31.5–36.0)
MCV: 89.2 fL (ref 79.5–101.0)
MONO#: 0.4 10*3/uL (ref 0.1–0.9)
MONO%: 7.6 % (ref 0.0–14.0)
NEUT%: 64.7 % (ref 38.4–76.8)
NEUTROS ABS: 3.1 10*3/uL (ref 1.5–6.5)
PLATELETS: 214 10*3/uL (ref 145–400)
RBC: 4.45 10*6/uL (ref 3.70–5.45)
RDW: 16.7 % — ABNORMAL HIGH (ref 11.2–14.5)
WBC: 4.7 10*3/uL (ref 3.9–10.3)
lymph#: 1.1 10*3/uL (ref 0.9–3.3)

## 2015-06-25 ENCOUNTER — Ambulatory Visit (HOSPITAL_BASED_OUTPATIENT_CLINIC_OR_DEPARTMENT_OTHER): Payer: 59 | Admitting: Nurse Practitioner

## 2015-06-25 ENCOUNTER — Encounter: Payer: Self-pay | Admitting: Nurse Practitioner

## 2015-06-25 ENCOUNTER — Encounter: Payer: Self-pay | Admitting: *Deleted

## 2015-06-25 ENCOUNTER — Other Ambulatory Visit: Payer: Self-pay | Admitting: *Deleted

## 2015-06-25 ENCOUNTER — Telehealth: Payer: Self-pay | Admitting: Oncology

## 2015-06-25 VITALS — BP 130/72 | HR 73 | Temp 98.3°F | Resp 18 | Ht 61.0 in | Wt 214.1 lb

## 2015-06-25 DIAGNOSIS — Z23 Encounter for immunization: Secondary | ICD-10-CM

## 2015-06-25 DIAGNOSIS — C50411 Malignant neoplasm of upper-outer quadrant of right female breast: Secondary | ICD-10-CM

## 2015-06-25 DIAGNOSIS — L709 Acne, unspecified: Secondary | ICD-10-CM | POA: Diagnosis not present

## 2015-06-25 MED ORDER — CLINDAMYCIN PHOSPHATE 1 % EX GEL
Freq: Two times a day (BID) | CUTANEOUS | Status: DC
Start: 2015-06-25 — End: 2015-07-03

## 2015-06-25 MED ORDER — INFLUENZA VAC SPLIT QUAD 0.5 ML IM SUSY
0.5000 mL | PREFILLED_SYRINGE | Freq: Once | INTRAMUSCULAR | Status: AC
  Administered 2015-06-25: 0.5 mL via INTRAMUSCULAR
  Filled 2015-06-25: qty 0.5

## 2015-06-25 NOTE — Telephone Encounter (Signed)
Gave avs & calendar for March 2017. °

## 2015-06-25 NOTE — Progress Notes (Signed)
[place]  [phone] [phone]     ID: NICCI VAUGHAN DOB: 1955-12-05  MR#: [id number]  JYN#:[id number]  Patient Care Team: Debbrah Alar, NP as PCP - General (Internal Medicine) Harle Battiest, MD (Obstetrics and Gynecology) Rolm Bookbinder, MD as Consulting Physician (General Surgery) Chauncey Cruel, MD as Consulting Physician (Oncology) Eppie Gibson, MD as Attending Physician (Radiation Oncology) Rockwell Germany, RN as Registered Nurse Mauro Kaufmann, RN as Registered Nurse Holley Bouche, NP as Nurse Practitioner (Nurse Practitioner) OTHER MD:  CHIEF COMPLAINT: Triple negative breast cancer  CURRENT TREATMENT: observation  BREAST CANCER HISTORY: From the original intake note:  The patient herself palpated a mass in her right breast there is she brought it to her gynecologist, Dr. Alan Ripper attention and he obtained screening mammography which suggested an abnormality in the right breast. On 10/24/2014 the patient underwent right diagnostic mammography and ultrasonography at the breast Center. The breast density was category B. There was an ill-defined mass in the upper outer quadrant of the right breast which was palpable on exam. By ultrasound this was irregular and hypoechoic measuring 1.2 cm. Evaluation of the right axilla was negative.  Biopsy of the mass in question showed an invasive ductal carcinoma, grade 2 or 3, triple negative, with an MIB-1 of 19%.  Ms. Sakamoto case was presented at the multidisciplinary breast cancer conference 11/19/2014, where was suggested she would benefit from genetics testing. Since this would mandate a significant delay in her final surgery, neoadjuvant chemotherapy was indicated.  The patient's subsequent history is as detailed below  INTERVAL HISTORY: Nohea returns today for follow-up of her breast cancer, alone. Since her last visit here, she has completed radiation. She tolerated this  well, with no complaints. The interval history is generally unremarkable. She did notice for a few weeks she cried frequently and had episodes of sadness. This surprised her as she did not have this issue at all while undergoing treatment. She is feeling better today, but the thoughts she frequently had about death scared her.   REVIEW OF SYSTEMS: Jacqulin denies fevers, chills, nausea, vomiting, or changes in bowel or bladder habits. She is eating and drinking well. She has residual neuropathy to her toes, especially at night, but did not find it to be aggressive enough to start on the gabapentin she was prescribed. She denies shortness of breath, chest pain, cough, or palpitations. She has an acne-like rash to her face that bothers her in an optical sense. It does not hurt or itch. A detailed review of systems is otherwise stable.  PAST MEDICAL HISTORY: Past Medical History  Diagnosis Date  . Osteoporosis   . Mitral valve prolapse 1990  . Breast cancer of upper-outer quadrant of right female breast 11/14/2014  . History of seizures     as a child - unknown cause - states was never on anticonvulsants  . History of kidney stones   . Seasonal allergies   . Hypertension     states under control with meds., has been on med. x "years"  . History of chemotherapy     finished chemo 02/03/2015  . Breast cancer, right breast     PAST SURGICAL HISTORY: Past Surgical History  Procedure Laterality Date  . Appendectomy  1976  . Abdominal hysterectomy  1998    partial  . Tonsillectomy  1970s  . Knee arthroscopy Left 03/26/2008  . Tubal ligation  1996  . Salivary stone removal Right 1972  . Colonoscopy    .  Portacath placement N/A 11/27/2014    Procedure: INSERTION PORT-A-CATH;  Surgeon: Rolm Bookbinder, MD;  Location: [place];  Service: General;  Laterality: N/A;  . Plantar's wart excision Left     x 2  . Radioactive seed guided mastectomy with axillary sentinel lymph node  biopsy Right 03/02/2015    Procedure: RADIOACTIVE SEED GUIDED RIGHT PARTIAL MASTECTOMY WITH RIGHT AXILLARY SENTINEL LYMPH NODE BIOPSY;  Surgeon: Rolm Bookbinder, MD;  Location: [place];  Service: General;  Laterality: Right;  . Port-a-cath removal Left 04/06/2015    Procedure: REMOVAL PORT-A-CATH;  Surgeon: Rolm Bookbinder, MD;  Location: [place];  Service: General;  Laterality: Left;    FAMILY HISTORY Family History  Problem Relation Age of Onset  . Heart disease Mother   . Stroke Mother   . Hypertension Mother   . Diabetes Mother   . Diabetes Sister   . Kidney disease Brother   . Mental retardation Brother   . Breast cancer Cousin     deceased 57  . Diabetes Sister   . Heart attack Father    the patient knows little about her father. Her mother died at at the age of 23 following a stroke. The patient had 2 brothers, 2 sisters. The only history of breast or ovarian cancer in the family is a first cousin on the mother's side diagnosed with breast cancer at age 44  GYNECOLOGIC HISTORY:  Patient's last menstrual period was 10/10/1996. Menarche age 7, first live birth age 89. The patient is GX P2. She status post simple hysterectomy without salpingo-oophorectomy. She did not take hormone replacement.  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 The patient's husband, Jori Moll "Ron" Yankovich works for [employer] in the pattern shop. He also works as a Dispensing optician. Daughter Mairim Bade lives in [place] and works in Programmer, applications. Daughter [name] Thien lives in [place] and works at a [employer]. 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    ADVANCED DIRECTIVES: Not in place   HEALTH MAINTENANCE: Social History  Substance Use Topics  . Smoking status: Current Every Day Smoker -- 0.50 packs/day for 43 years    Types: Cigarettes  . Smokeless tobacco: Never Used     Comment: 8  cig./day  . Alcohol Use: No     Colonoscopy:  PAP:  Bone density:  Lipid panel:  Allergies  Allergen Reactions  . Sulfa Antibiotics Other (See Comments)    UNKNOWN    Current Outpatient Prescriptions  Medication Sig Dispense Refill  . aspirin 81 MG tablet Take 1 tablet (81 mg total) by mouth daily. 30 tablet 0  . chlorthalidone (HYGROTON) 25 MG tablet Take 1 tablet (25 mg total) by mouth every other day. 30 tablet 3  . enalapril (VASOTEC) 20 MG tablet TAKE 1 TABLET BY MOUTH AT BEDTIME 30 tablet 8  . hyaluronate sodium (RADIAPLEXRX) GEL Apply 1 application topically once.    . metoprolol succinate (TOPROL-XL) 50 MG 24 hr tablet TAKE 1 TABLET BY MOUTH AT BEDTIME 30 tablet 8  . albuterol (PROVENTIL HFA;VENTOLIN HFA) 108 (90 BASE) MCG/ACT inhaler Inhale into the lungs every 6 (six) hours as needed for wheezing or shortness of breath.    . clindamycin (CLINDAGEL) 1 % gel Apply topically 2 (two) times daily. 30 g 0  . fluticasone (FLONASE) 50 MCG/ACT nasal spray Place 1 spray into both nostrils as needed.    Marland Kitchen  gabapentin (NEURONTIN) 300 MG capsule Take 1 capsule (300 mg total) by mouth at bedtime. (Patient not taking: Reported on 05/04/2015) 30 capsule 2   No current facility-administered medications for this visit.    OBJECTIVE: Middle-aged African-American woman who appears stated age 59 Vitals:   06/25/15 1150  BP: 130/72  Pulse: 73  Temp: 98.3 F (36.8 C)  Resp: 18     Body mass index is 40.47 kg/(m^2).    ECOG FS:1 - Symptomatic but completely ambulatory  Skin: warm, dry, acniform rash along chin and under nose HEENT: sclerae anicteric, conjunctivae pink, oropharynx clear. No thrush or mucositis.  Lymph Nodes: No cervical or supraclavicular lymphadenopathy  Lungs: clear to auscultation bilaterally, no rales, wheezes, or rhonci  Heart: regular rate and rhythm  Abdomen: round, soft, non tender, positive bowel sounds  Musculoskeletal: No focal spinal tenderness, no  peripheral edema  Neuro: non focal, well oriented, positive affect  Breast: right breast status post lumpectomy and radiation. Residual hyperpigmentation noted. No evidence of recurrent disease. Right axilla benign. Left breast unremarkable.  LAB RESULTS:  CMP     Component Value Date/Time   NA 142 06/18/2015 1158   NA 137 09/08/2013 1120   K 4.1 06/18/2015 1158   K 3.6 09/08/2013 1120   CL 103 09/08/2013 1120   CO2 27 06/18/2015 1158   CO2 21 09/08/2013 1120   GLUCOSE 86 06/18/2015 1158   GLUCOSE 89 09/08/2013 1120   BUN 13.3 06/18/2015 1158   BUN 9 09/08/2013 1120   CREATININE 0.9 06/18/2015 1158   CREATININE 0.85 09/08/2013 1120   CREATININE 0.93 09/02/2013 1128   CALCIUM 9.5 06/18/2015 1158   CALCIUM 9.4 09/08/2013 1120   PROT 6.7 06/18/2015 1158   PROT 7.0 09/08/2013 1120   ALBUMIN 3.4* 06/18/2015 1158   ALBUMIN 3.4* 09/08/2013 1120   AST 12 06/18/2015 1158   AST 15 09/08/2013 1120   ALT 11 06/18/2015 1158   ALT 12 09/08/2013 1120   ALKPHOS 82 06/18/2015 1158   ALKPHOS 80 09/08/2013 1120   BILITOT 0.45 06/18/2015 1158   BILITOT 0.5 09/08/2013 1120   GFRNONAA 76* 09/08/2013 1120   GFRNONAA 70 09/02/2013 1128   GFRAA 88* 09/08/2013 1120   GFRAA 81 09/02/2013 1128    INo results found for: SPEP, UPEP  Lab Results  Component Value Date   WBC 4.7 06/18/2015   NEUTROABS 3.1 06/18/2015   HGB 13.1 06/18/2015   HCT 39.7 06/18/2015   MCV 89.2 06/18/2015   PLT 214 06/18/2015      Chemistry      Component Value Date/Time   NA 142 06/18/2015 1158   NA 137 09/08/2013 1120   K 4.1 06/18/2015 1158   K 3.6 09/08/2013 1120   CL 103 09/08/2013 1120   CO2 27 06/18/2015 1158   CO2 21 09/08/2013 1120   BUN 13.3 06/18/2015 1158   BUN 9 09/08/2013 1120   CREATININE 0.9 06/18/2015 1158   CREATININE 0.85 09/08/2013 1120   CREATININE 0.93 09/02/2013 1128      Component Value Date/Time   CALCIUM 9.5 06/18/2015 1158   CALCIUM 9.4 09/08/2013 1120   ALKPHOS 82  06/18/2015 1158   ALKPHOS 80 09/08/2013 1120   AST 12 06/18/2015 1158   AST 15 09/08/2013 1120   ALT 11 06/18/2015 1158   ALT 12 09/08/2013 1120   BILITOT 0.45 06/18/2015 1158   BILITOT 0.5 09/08/2013 1120       No results found for: LABCA2  No  components found for: EYEMV361  No results for input(s): INR in the last 168 hours.  Urinalysis    Component Value Date/Time   COLORURINE YELLOW 09/08/2013 [place] 09/08/2013 1258   LABSPEC 1.019 09/08/2013 1258   PHURINE 8.0 09/08/2013 1258   GLUCOSEU NEGATIVE 09/08/2013 1258   HGBUR TRACE* 09/08/2013 [place] 09/08/2013 1258   KETONESUR 40* 09/08/2013 1258   PROTEINUR NEGATIVE 09/08/2013 1258   UROBILINOGEN 0.2 09/08/2013 1258   NITRITE NEGATIVE 09/08/2013 1258   LEUKOCYTESUR NEGATIVE 09/08/2013 1258    STUDIES: No results found.  ASSESSMENT: 59 y.o. [place], [place] woman status post right breast upper outer quadrant biopsy 11/12/2014 for a clinical T1c N0, stage IA invasive ductal carcinoma, triple negative, with an MIB-1 of 19%  (1) neoadjuvant chemotherapy will consist of cyclophosphamide and docetaxel 4 given every 21 days with Neulasta support, first dose 12/02/2014, completed 02/03/2015  (2) genetics testing through the [facility] gene panel at [facility] showed no deleterious mutations in ATM, BARD1, BRCA1, BRCA2, BRIP1, CDH1, CHEK2, MRE11A, MUTYH, NBN, NF1, PALB2, PTEN, RAD50, RAD51C, RAD51D, oe TP53.  (3) status post right lumpectomy and sentinel node sampling 03/02/2015 for a ypT1b ypN0 stage IA invasive ductal carcinoma, grade 1, with negative margins. Repeat HER-2 was again not amplified.  (4) radiation completed August 2016  (5) continuing tobacco abuse. The patient has been strongly advised to quit smoking  PLAN: Judie is coming along well since she has completed the prescribed treatments and procedures suggested to her for her breast cancer. She performed  well throughout and is now transitioning into long term follow up. The labs were reviewed in detail and were stable. She will continue to manage her residual neuropathy alone. We discussed her occasional sadness, and this could best be summed up as post traumatic stress. I gave her information on our [place] Normal sessions that plenty of patients have found helpful during this time.  I have prescribed her a clindamycin gel to apply to the acne on her face, as she declined oral doxycycline. I advised her to mix it with a moisturizer and use every other day if it made her face too dry.   Marisela will meet with survivorship this fall, Dr. Donne Hazel in December, and will return to see Dr. Jana Hakim in March after her repeat mammogram in February. She understands and agrees with this plan. She has been encouraged to call with any issues that might arise before her next visit here.   Laurie Panda, NP   06/25/2015 1:11 PM

## 2015-07-03 ENCOUNTER — Encounter: Payer: Self-pay | Admitting: Radiation Oncology

## 2015-07-03 ENCOUNTER — Ambulatory Visit
Admission: RE | Admit: 2015-07-03 | Discharge: 2015-07-03 | Disposition: A | Discharge status: Home / Self Care | Payer: 59 | Source: Ambulatory Visit | Attending: Radiation Oncology | Admitting: Radiation Oncology

## 2015-07-03 VITALS — BP 124/73 | HR 70 | Temp 98.2°F | Resp 16 | Ht 61.0 in | Wt 216.0 lb

## 2015-07-03 DIAGNOSIS — C50411 Malignant neoplasm of upper-outer quadrant of right female breast: Secondary | ICD-10-CM

## 2015-07-03 NOTE — Progress Notes (Signed)
Katie Jacobson her for follow up. She denies pain except occasional sharp pains in right breast. She reports fatigue but able to complete daily activities and reports her appetite is good. She is not taking anti-estrogen medication. Her right breast has hyperpigmentation, she is using radiaplex once a day. Recommended she use cocoa butter or vitamin E cream after radiaplex is finished. BP 124/73 mmHg  Pulse 70  Temp(Src) 98.2 F (36.8 C) (Oral)  Resp 16  Ht 5\' 1"  (1.549 m)  Wt 216 lb (97.977 kg)  BMI 40.83 kg/m2  LMP 10/10/1996

## 2015-07-03 NOTE — Progress Notes (Signed)
Radiation Oncology         [phone] ________________________________  Name: Katie Jacobson MRN: [id number]  Date: 07/03/2015  DOB: Mar 11, 1956  Follow-Up Visit Note  Outpatient  CC: Nance Pear., NP  Rolm Bookbinder, MD  Diagnosis and Prior Radiotherapy:    ICD-9-CM ICD-10-CM   1. Breast cancer of upper-outer quadrant of right female breast 174.4 C50.411   Right breast invasive ductal carcinoma, triple negative, clinical T1cN0M0, ypT1b ypN0    Indication for treatment:  curative      Radiation treatment dates:   04/20/2015-05/29/2015 Site/dose:   (1) Right Breast / 50 Gy in 25 fractions (2) Right Breast boost / 10 Gy in 5 fractions Beams/energy:  (1) 3D conformal tangents / 10 and 6 MV photons (2) photon boost / 15 and 6 MV photons  Narrative:  The patient returns today for routine follow-up appointment with radiation oncology. She denies symptoms of pain, except occasional sharp pains in right breast. She also reports symptoms of fatigue, but she is still able to complete daily activities. The patient reports a healthy appetite. She is not currently taking anti-estrogen medication. Hyperpigmentation to her right breast is observed and to treat this the patient is using radiaplex once a day. She attended her follow-up appointment with medical oncology, two weeks previously. "I'm doing pretty good. I just got back from [facility] yesterday," the patient stated. She projected a health mental status and was not accompanied by her husband for today's radiation oncology appointment. The patient was excited about her "Women's Only 5K" shirt that her sister purchased for her. She expressed slight concern in regards to finances and paying for treatment. This concern was addressed.                    ALLERGIES:  is allergic to sulfa antibiotics.  Meds: Current Outpatient Prescriptions  Medication Sig Dispense Refill  . albuterol (PROVENTIL HFA;VENTOLIN HFA) 108 (90 BASE)  MCG/ACT inhaler Inhale into the lungs every 6 (six) hours as needed for wheezing or shortness of breath.    Marland Kitchen aspirin 81 MG tablet Take 1 tablet (81 mg total) by mouth daily. 30 tablet 0  . chlorthalidone (HYGROTON) 25 MG tablet Take 1 tablet (25 mg total) by mouth every other day. 30 tablet 3  . enalapril (VASOTEC) 20 MG tablet TAKE 1 TABLET BY MOUTH AT BEDTIME 30 tablet 8  . fluticasone (FLONASE) 50 MCG/ACT nasal spray Place 1 spray into both nostrils as needed.    . hyaluronate sodium (RADIAPLEXRX) GEL Apply 1 application topically once.    . metoprolol succinate (TOPROL-XL) 50 MG 24 hr tablet TAKE 1 TABLET BY MOUTH AT BEDTIME 30 tablet 8  . gabapentin (NEURONTIN) 300 MG capsule Take 1 capsule (300 mg total) by mouth at bedtime. (Patient not taking: Reported on 05/04/2015) 30 capsule 2   No current facility-administered medications for this encounter.    Physical Findings: The patient is in no acute distress. Patient is alert and oriented. There is no significant changes to the status of the paients overall health to be noted at this time.  height is 5\' 1"  (1.549 m) and weight is 216 lb (97.977 kg). Her oral temperature is 98.2 F (36.8 C). Her blood pressure is 124/73 and her pulse is 70. Her respiration is 16.   Right Breast: Mildly hyperpigmented, skin is smooth and intact    Lab Findings: Lab Results  Component Value Date   WBC 4.7 06/18/2015   HGB 13.1  06/18/2015   HCT 39.7 06/18/2015   MCV 89.2 06/18/2015   PLT 214 06/18/2015    Radiographic Findings: No results found.  Impression/Plan: The patient is healing well from the effects of radiation therapy. Healthy methods of management to address vocalized symptoms were reviewed. She is managing current symptoms appropriately. She has been briefly educated on the benefits and purpose of joining the [employer]. Physical activity in moderation to the patient's needs is encouraged to reach optimal health in recovery.  The patient understands the importance of receiving annual mammograms in regards to the management of her disease. The patient understands the purpose, instructions of administration, and proper use of radiaplex and that she may additionally benefit from using cocoa butter or vitamin E oil once her radiaplex is finished. She is encouraged to continue daily use of radiaplex until it is finished. Additionally, the patient was encouraged to attend the Women[employer] 5K. She understands to follow-up with medical oncology as scheduled moving forward and to attend her appointment with medical oncology in six months. The patient is aware to attend follow-up with radiation oncology on an as needed basis, moving forward. All vocalized questions and concerns have been addressed. If the patient develops any further questions or concerns in regards to her treatment and recovery, she has been encouraged to contact Dr. Isidore Moos, MD.    This document serves as a record of services personally performed by Eppie Gibson, MD. It was created on her behalf by Lenn Cal, a trained medical scribe. The creation of this record is based on the scribe's personal observations and the provider's statements to them. This document has been checked and approved by the attending provider.  _____________________________________   Eppie Gibson, MD

## 2015-07-27 ENCOUNTER — Ambulatory Visit (INDEPENDENT_AMBULATORY_CARE_PROVIDER_SITE_OTHER): Payer: 59 | Admitting: Cardiology

## 2015-07-27 ENCOUNTER — Encounter: Payer: Self-pay | Admitting: Cardiology

## 2015-07-27 VITALS — BP 114/72 | HR 85 | Ht 61.0 in | Wt 214.0 lb

## 2015-07-27 DIAGNOSIS — E669 Obesity, unspecified: Secondary | ICD-10-CM

## 2015-07-27 DIAGNOSIS — R002 Palpitations: Secondary | ICD-10-CM | POA: Diagnosis not present

## 2015-07-27 DIAGNOSIS — E785 Hyperlipidemia, unspecified: Secondary | ICD-10-CM | POA: Diagnosis not present

## 2015-07-27 DIAGNOSIS — I10 Essential (primary) hypertension: Secondary | ICD-10-CM

## 2015-07-27 DIAGNOSIS — Z720 Tobacco use: Secondary | ICD-10-CM

## 2015-07-27 MED ORDER — ENALAPRIL MALEATE 20 MG PO TABS: 20.0000 mg | ORAL_TABLET | Freq: Every day | ORAL | Status: DC

## 2015-07-27 MED ORDER — CHLORTHALIDONE 25 MG PO TABS: 25.0000 mg | ORAL_TABLET | ORAL | Status: DC

## 2015-07-27 MED ORDER — METOPROLOL TARTRATE 25 MG PO TABS: 25.0000 mg | ORAL_TABLET | Freq: Two times a day (BID) | ORAL | Status: DC

## 2015-07-27 NOTE — Progress Notes (Addendum)
PCP: Nance Pear., NP  Clinic Note: Chief Complaint  Patient presents with  . Annual Exam  . Palpitations  . Hypertension    HPI: Katie Jacobson is a 59 y.o. female with a PMH below who presents today for delayed annual f/u: HTN, HLD, Palpitations.  Katie Jacobson was last seen by me on June 2015 and then by Mr. Tarri Fuller, PA on October 15 to followup blood pressure.at that time she noted trying to use a paper cigarette. She also lost 6 pounds.  At that time she was taking chlorthalidone every other day   Recent Hospitalizations:  -- undergoing Rx for Breast Cancer  - s/p Lumpectomy May 2016 he has now had chemotherapy and radiation that was completed at the end of September. She still has not fully recovered from chemotherapy and radiation, stating that she still is a bit tired.  Studies Reviewed: no new studies  Interval History: the cardiac standpoint she seems relatively stable. No major complaints. She has occasional fluttering in her chest but nothing last more than a couple seconds. No prolonged palpitations. No headaches or blurred vision.  Despite undergoing chemotherapy and radiation she still was able to do all of her daily living activities without much issue besides feeling tired.   No chest pain or shortness of breath with rest or exertion.  No PND, orthopnea or edema.  No lightheadedness, dizziness, weakness or syncope/near syncope. No TIA/amaurosis fugax symptoms. No claudication.  ROS: A comprehensive was performed. Review of Systems  Constitutional: Negative for malaise/fatigue.  HENT: Negative for nosebleeds.   Respiratory: Negative for cough and shortness of breath.   Cardiovascular: Negative for claudication.  Gastrointestinal: Negative for blood in stool and melena.  Genitourinary: Negative for hematuria.  Musculoskeletal: Positive for joint pain (Mild aches and pains).  Neurological: Positive for dizziness (Sometimes positional).    Endo/Heme/Allergies: Bruises/bleeds easily.  Psychiatric/Behavioral: Negative.        Never lost her spirit during her chemotherapy  All other systems reviewed and are negative.    Past Medical History  Diagnosis Date  . Osteoporosis   . Mitral valve prolapse 1990  . Breast cancer of upper-outer quadrant of right female breast ([place]) 11/14/2014  . History of seizures     as a child - unknown cause - states was never on anticonvulsants  . History of kidney stones   . Seasonal allergies   . Hypertension     states under control with meds., has been on med. x "years"  . History of chemotherapy     finished chemo 02/03/2015  . Breast cancer, right breast [facility])     Past Surgical History  Procedure Laterality Date  . Appendectomy  1976  . Abdominal hysterectomy  1998    partial  . Tonsillectomy  1970s  . Knee arthroscopy Left 03/26/2008  . Tubal ligation  1996  . Salivary stone removal Right 1972  . Colonoscopy    . Portacath placement N/A 11/27/2014    Procedure: INSERTION PORT-A-CATH;  Surgeon: Rolm Bookbinder, MD;  Location: [place];  Service: General;  Laterality: N/A;  . Plantar's wart excision Left     x 2  . Radioactive seed guided mastectomy with axillary sentinel lymph node biopsy Right 03/02/2015    Procedure: RADIOACTIVE SEED GUIDED RIGHT PARTIAL MASTECTOMY WITH RIGHT AXILLARY SENTINEL LYMPH NODE BIOPSY;  Surgeon: Rolm Bookbinder, MD;  Location: [place];  Service: General;  Laterality: Right;  . Port-a-cath removal Left 04/06/2015  Procedure: REMOVAL PORT-A-CATH;  Surgeon: Rolm Bookbinder, MD;  Location: [place];  Service: General;  Laterality: Left;    Prior to Admission medications   Medication Sig Start Date End Date Taking? Authorizing Provider  albuterol (PROVENTIL HFA;VENTOLIN HFA) 108 (90 BASE) MCG/ACT inhaler Inhale into the lungs every 6 (six) hours as needed for wheezing or shortness of breath.   Yes  Historical Provider, MD  aspirin 81 MG tablet Take 1 tablet (81 mg total) by mouth daily. 04/21/15  Yes Chauncey Cruel, MD  chlorthalidone (HYGROTON) 25 MG tablet Take 1 tablet (25 mg total) by mouth every other day. 12/25/14  Yes Leonie Man, MD  enalapril (VASOTEC) 20 MG tablet TAKE 1 TABLET BY MOUTH AT BEDTIME 04/20/15  Yes Leonie Man, MD  fluticasone [facility]) 50 MCG/ACT nasal spray Place 1 spray into both nostrils as needed. 07/12/14  Yes Historical Provider, MD  hyaluronate sodium (RADIAPLEXRX) GEL Apply 1 application topically as needed.    Yes Historical Provider, MD  metoprolol succinate (TOPROL-XL) 50 MG 24 hr tablet TAKE 1 TABLET BY MOUTH AT BEDTIME 04/20/15  Yes Leonie Man, MD   Allergies  Allergen Reactions  . Sulfa Antibiotics Other (See Comments)    UNKNOWN    Social History   Social History  . Marital Status: Married    Spouse Name: N/A  . Number of Children: 2  . Years of Education: N/A   Social History Main Topics  . Smoking status: Current Every Day Smoker -- 0.50 packs/day for 43 years    Types: Cigarettes  . Smokeless tobacco: Never Used     Comment: 8 cig./day  . Alcohol Use: No  . Drug Use: No  . Sexual Activity: Not Asked   Other Topics Concern  . None   Social History Narrative   Married with 2 daughters- born 38 and 1992   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  Has been doing part time work with [employer] -- had 6 active clients, but the most important one is her Daughter.   Regular exercise:  No   Caffeine Use: none; Current 1ppd smoker x > 39 yrs; EtOH ~4 oz/week   Family History  Problem Relation Age of Onset  . Heart disease Mother   . Stroke Mother   . Hypertension Mother   . Diabetes Mother   . Diabetes Sister   . Kidney disease Brother   . Mental retardation Brother   . Breast cancer Cousin     deceased 93  . Diabetes Sister   . Heart attack Father     Wt Readings  from Last 3 Encounters:  07/28/15 218 lb 6.4 oz (99.066 kg)  07/27/15 214 lb (97.07 kg)  07/03/15 216 lb (97.977 kg)  last Year 03/2014: 215 lb  PHYSICAL EXAM BP 114/72 mmHg  Pulse 85  Ht 5\' 1"  (1.549 m)  Wt 214 lb (97.07 kg)  BMI 40.46 kg/m2  LMP 10/10/1996 General appearance: alert, cooperative, appears stated age, no distress and moderately obese HEENT: [place]/AT, EOMI, MMM, anicteric sclera Neck: no adenopathy, no carotid bruit, no JVD and supple, symmetrical, trachea midline Lungs: clear to auscultation bilaterally, normal percussion bilaterally and non-labored, good air movement Heart: normal apical impulse, regular rate and rhythm, S1, S2 normal and 1/2 c-d SEM @ base without radiation; no R/G Abdomen: soft, non-tender; bowel sounds normal; no masses, no organomegaly Extremities: extremities normal, atraumatic, no cyanosis  or edema, no edema, redness or tenderness in the calves or thighs and no ulcers, gangrene or trophic changes Pulses: 2+ and symmetric Neurologic: Grossly normal    Adult ECG Report  Rate: 90 ;  Rhythm: normal sinus rhythm; normal axis, intervals and durations  Narrative Interpretation: normal EKG   Other studies Reviewed: Additional studies/ records that were reviewed today include:  Recent Labs:  No new labs Lab Results  Component Value Date   CHOL 215* 04/09/2014   HDL 74 04/09/2014   LDLCALC 129* 04/09/2014   TRIG 61 04/09/2014   CHOLHDL 2.9 09/02/2013    ASSESSMENT / PLAN: Problem List Items Addressed This Visit    Tobacco abuse (Chronic)    Apparently her paper cigarette broke and now she is back to smoking. Discussed importance of him smoking cessation. She is "trying to quit" -- we discussed using patches or trying back on the vapor cigarette      Obesity (BMI 35.0-39.9 without comorbidity) ([place]) (Chronic)    She was referred to a dietitian, but she did not really follow up of that. At least her weight has remained stable.  The patient  understands the need to lose weight with diet and exercise. We have discussed specific strategies for this.      Hyperlipidemia LDL goal <130 (Chronic)    Probably not quite where we would like her lipids to be for risk factor modification. I discussed importance of dietary discretion and exercise. If she still remains borderline in the future would consider potential statin therapy.      Relevant Medications   metoprolol tartrate (LOPRESSOR) 25 MG tablet   enalapril (VASOTEC) 20 MG tablet   chlorthalidone (HYGROTON) 25 MG tablet   Heart palpitations (Chronic)    Well-controlled on beta blocker. Apparently her insurance company has changed and she is now having issues with the co-pay for Toprol. Therefore we will switch her to Lopressor 25 mg twice a day.      Essential hypertension - Primary (Chronic)    Excellent control on beta blocker, ACE inhibitor and chlorthalidone. She just goes he's here to take one pill every other day as opposed to one half pill a day. As long as her pressure is as well-controlled as it is, I am fine with that.      Relevant Medications   metoprolol tartrate (LOPRESSOR) 25 MG tablet   enalapril (VASOTEC) 20 MG tablet   chlorthalidone (HYGROTON) 25 MG tablet   Other Relevant Orders   EKG 12-Lead (Completed)      Current medicines are reviewed at length with the patient today. (+/- concerns) Toprol is becoming expensive because of new insurance coverage The following changes have been made:  Stop metoprolol succinate -> Start metoprolol tartrate 25 mg one tablet twice a day  May take an extra metoprolol if needed for palp. ( one tablet daily)   Your physician wants you to follow-up in 12 months with DR  Ellyn Hack.  Studies Ordered:   Orders Placed This Encounter  Procedures  . EKG 12-Lead     Leonie Man, M.D., M.S. Interventional Cardiologist   Pager # [phone]

## 2015-07-27 NOTE — Patient Instructions (Signed)
Stop metoprolol succ  Start metoprolol tartrate 25 mg one tablet twice a day  May take an extra metoprolol if needed for palp. ( one tablet daily)   Your physician wants you to follow-up in 12 months with DR  Ellyn Hack.  You will receive a reminder letter in the mail two months in advance. If you don't receive a letter, please call our office to schedule the follow-up appointment.

## 2015-07-28 ENCOUNTER — Encounter: Payer: Self-pay | Admitting: Oncology

## 2015-07-28 ENCOUNTER — Encounter: Payer: Self-pay | Admitting: Nurse Practitioner

## 2015-07-28 ENCOUNTER — Telehealth: Payer: Self-pay | Admitting: *Deleted

## 2015-07-28 ENCOUNTER — Ambulatory Visit (HOSPITAL_BASED_OUTPATIENT_CLINIC_OR_DEPARTMENT_OTHER): Payer: 59 | Admitting: Nurse Practitioner

## 2015-07-28 VITALS — BP 118/65 | HR 88 | Temp 98.1°F | Resp 18 | Ht 61.0 in | Wt 218.4 lb

## 2015-07-28 DIAGNOSIS — C50411 Malignant neoplasm of upper-outer quadrant of right female breast: Secondary | ICD-10-CM | POA: Diagnosis not present

## 2015-07-28 DIAGNOSIS — Z720 Tobacco use: Secondary | ICD-10-CM

## 2015-07-28 DIAGNOSIS — G620 Drug-induced polyneuropathy: Secondary | ICD-10-CM

## 2015-07-28 NOTE — Progress Notes (Signed)
CLINIC:  Cancer Survivorship   REASON FOR VISIT:  Routine follow-up post-treatment for a recent history of breast cancer.  BRIEF ONCOLOGIC HISTORY:    Breast cancer of upper-outer quadrant of right female breast ([name])   10/24/2014 Mammogram Right breast: ill-defined mass in the UOQ   10/24/2014 Breast US Right breast: irregular hypoechoic taller than wide mass in the 11 o'clock location, 4 cm from the nipple, measuring 0.9 x 1.1 x 1.2 cm. Evaluation of the right axilla is negative for adenopathy.   11/12/2014 Initial Biopsy Right breast needle core bx: Invasive ductal carcinoma, ER- (0%), PR- (0%), HER2/neu negative, Ki67 19%, grade 2-3.   11/20/2014 Procedure Genetic testing: BreastNext panel Cephus Shelling) reveals no clinically significant variant at ATM, BARD1, BRCA1, BRCA2, BRIP1, CDH1, CHEK2, MRE11A, MUTYH, NBN, NF1, PALB2, PTEN, RAD50, RAD51C, RAD51D, and TP53.   11/21/2014 Breast MRI Right breast mass measuring up to 1.3 cm without lymphadenopathy or findings to suggest multifocal or multicentric disease. Benign circumscribed oval enhancing mass located posterior medial to the malignancy consistent with fibroadenoma   11/24/2014 Clinical Stage Stage IA: T1c N0   12/02/2014 - 02/03/2015 Neo-Adjuvant Chemotherapy Cyclophosphamide and docetaxel x 4 cycles   02/09/2015 Breast MRI Right breast mass measures 1.0 x 0.7 x 0.5 cm, decreased from 1.4 x 1.4 x 1.0 cm. No additional areas of suspicion in right or left breast.   03/02/2015 Definitive Surgery Right lumpectomy / SLNB Donne Hazel): IDC, grade 1, with high grade DCIS spanning 1 cmHER2/neu repeated and remains negative (ratio 1.62). 4 LN removed and negative for malignancy (0/4 LN).   03/02/2015 Pathologic Stage Stage IA: ypT1b ypN0   04/20/2015 - 05/29/2015 Radiation Therapy Adjuvant RT Isidore Moos): Right Breast  50 Gy over 25 fractions. Right Breast boost 10 Gy over 5 fractions. Total dose: 60 Gy.    INTERVAL HISTORY:  Katie Jacobson presents to the Survivorship  Clinic today for our initial meeting to review her survivorship care plan detailing her treatment course for breast cancer, as well as monitoring long-term side effects of that treatment, education regarding health maintenance, screening, and overall wellness and health promotion.     Overall, Katie Jacobson reports feeling quite well since completing her radiation therapy approximately two months ago.  She remains fatigued but reports her skin overlying her right breast is improved.  She continues with mild peripheral neuropathy and nail changes, as well.  Katie Jacobson reports a good appetite and denies weight loss.  Her hair is growing back and her taste is almost back to normal.  She denies any change in either breast.  She has had no headache, cough, shortness of breath, or bone pain. She will occasionally have some shooting, stinging pain at her right breast, which resolves without treatment.  Katie Jacobson, unfortunately, continues to smoke and states she knows that she needs to quit.    REVIEW OF SYSTEMS:  General: Fatigue as above. Denies fever or chills. Cardiac: Denies palpitations, chest pain, and lower extremity edema.  Respiratory: Denies dyspnea on exertion.  GI: Denies abdominal pain, constipation, diarrhea, nausea, or vomiting.  GU: Denies dysuria, hematuria, vaginal bleeding, vaginal discharge, or vaginal dryness.  Musculoskeletal: Denies joint or bone pain.  Neuro: Denies recent falls.  Skin: Denies rash, pruritis, or open wounds.  Breast: Denies any new nodularity, masses, tenderness, nipple changes, or nipple discharge.  Psych: Occasional anxiety and tearfulness since completing treatment, otherwise, denies depression, insomnia, or memory loss.   A 14-point review of systems was completed and was negative,  except as noted above.   ONCOLOGY TREATMENT TEAM:  1. Surgeon:  Dr. Donne Hazel at [facility]  2. Medical Oncologist: Dr. Jana Hakim 3. Radiation Oncologist: Dr. Isidore Moos     PAST MEDICAL/SURGICAL HISTORY:  Past Medical History  Diagnosis Date  . Osteoporosis   . Mitral valve prolapse 1990  . Breast cancer of upper-outer quadrant of right female breast ([place]) 11/14/2014  . History of seizures     as a child - unknown cause - states was never on anticonvulsants  . History of kidney stones   . Seasonal allergies   . Hypertension     states under control with meds., has been on med. x "years"  . History of chemotherapy     finished chemo 02/03/2015  . Breast cancer, right breast [facility])    Past Surgical History  Procedure Laterality Date  . Appendectomy  1976  . Abdominal hysterectomy  1998    partial  . Tonsillectomy  1970s  . Knee arthroscopy Left 03/26/2008  . Tubal ligation  1996  . Salivary stone removal Right 1972  . Colonoscopy    . Portacath placement N/A 11/27/2014    Procedure: INSERTION PORT-A-CATH;  Surgeon: Rolm Bookbinder, MD;  Location: [facility];  Service: General;  Laterality: N/A;  . Plantar's wart excision Left     x 2  . Radioactive seed guided mastectomy with axillary sentinel lymph node biopsy Right 03/02/2015    Procedure: RADIOACTIVE SEED GUIDED RIGHT PARTIAL MASTECTOMY WITH RIGHT AXILLARY SENTINEL LYMPH NODE BIOPSY;  Surgeon: Rolm Bookbinder, MD;  Location: [place];  Service: General;  Laterality: Right;  . Port-a-cath removal Left 04/06/2015    Procedure: REMOVAL PORT-A-CATH;  Surgeon: Rolm Bookbinder, MD;  Location: [place];  Service: General;  Laterality: Left;     ALLERGIES:  Allergies  Allergen Reactions  . Sulfa Antibiotics Other (See Comments)    UNKNOWN     CURRENT MEDICATIONS:  Current Outpatient Prescriptions on File Prior to Visit  Medication Sig Dispense Refill  . albuterol (PROVENTIL HFA;VENTOLIN HFA) 108 (90 BASE) MCG/ACT inhaler Inhale into the lungs every 6 (six) hours as needed for wheezing or shortness of breath.    Marland Kitchen aspirin 81 MG tablet Take 1  tablet (81 mg total) by mouth daily. 30 tablet 0  . chlorthalidone (HYGROTON) 25 MG tablet Take 1 tablet (25 mg total) by mouth every other day. 90 tablet 3  . enalapril (VASOTEC) 20 MG tablet Take 1 tablet (20 mg total) by mouth at bedtime. 90 tablet 3  . fluticasone (FLONASE) 50 MCG/ACT nasal spray Place 1 spray into both nostrils as needed.    . hyaluronate sodium (RADIAPLEXRX) GEL Apply 1 application topically as needed.     . metoprolol tartrate (LOPRESSOR) 25 MG tablet Take 1 tablet (25 mg total) by mouth 2 (two) times daily. 180 tablet 3   No current facility-administered medications on file prior to visit.     ONCOLOGIC FAMILY HISTORY:  Family History  Problem Relation Age of Onset  . Heart disease Mother   . Stroke Mother   . Hypertension Mother   . Diabetes Mother   . Diabetes Sister   . Kidney disease Brother   . Mental retardation Brother   . Breast cancer Cousin     deceased 18  . Diabetes Sister   . Heart attack Father      GENETIC COUNSELING/TESTING: Yes, performed 11/20/2014. BreastNext panel Cephus Shelling) reveals no clinically significant variant at  ATM, BARD1, BRCA1, BRCA2, BRIP1, CDH1, CHEK2, MRE11A, MUTYH, NBN, NF1, PALB2, PTEN, RAD50, RAD51C, RAD51D, and TP53.  SOCIAL HISTORY:  NIKITHA MODE is married and lives with her spouse in [place], [place].  She has 2 children.  Katie Jacobson is currently not working. As above, she continues to smoke - up to 1 PPD - and denies any alcohol or illicit drug use.     PHYSICAL EXAMINATION:  Vital Signs:   Filed Vitals:   07/28/15 1415  BP: 118/65  Pulse: 88  Temp: 98.1 F (36.7 C)  Resp: 18   ECOG Performance status: 0 General: Well-nourished, well-appearing female in no acute distress.  She is unaccompanied in clinic today.   HEENT: Head is atraumatic and normocephalic.  Pupils equal and reactive to light and accomodation. Conjunctivae clear without exudate.  Sclerae anicteric. Oral mucosa is pink, moist,  and intact without lesions.  Oropharynx is pink without lesions or erythema.  Lymph: No cervical, supraclavicular, infraclavicular, or axillary lymphadenopathy noted on palpation.  Cardiovascular: Regular rate and rhythm without murmurs, rubs, or gallops. Respiratory: Expiratory wheeze at right posterior lung, clears with cough, otherwise lungs clear to auscultation. Chest expansion symmetric without accessory muscle use on inspiration or expiration.  GI: Abdomen soft and round. No tenderness to palpation. Bowel sounds normoactive in 4 quadrants.  GU: Deferred.  Musculoskeletal: Muscle strength 5/5 in all extremities.   Neuro: No focal deficits. Steady gait.  Psych: Mood and affect normal and appropriate for situation.  Extremities: No edema, cyanosis, or clubbing.  Skin: Warm and dry. No open lesions noted.   LABORATORY DATA:  None for this visit.  DIAGNOSTIC IMAGING:  None for this visit.     ASSESSMENT AND PLAN:   1. History of breast cancer: Stage IA invasive ductal carcinoma of the right breast, triple negative, S/P lumpectomy and SLNB followed by adjuvant RT, followed now in a program of surveillance.  Katie Jacobson is doing well without clinical symptoms worrisome for disease recurrence.  She will follow-up with her medical oncologist,  Dr. Jana Hakim, in March 2017 with history and physical exam per surveillance protocol. We have reviewed symptoms to monitor for and report.  With triple negative breast cancer, her greatest risk of recurrence occurs early.  A comprehensive survivorship care plan and treatment summary was reviewed with the patient today detailing her breast cancer diagnosis, treatment course, potential late/long-term effects of treatment, appropriate follow-up care with recommendations for the future, and patient education resources.  A copy of this summary, along with a letter will be sent to the patient's primary care provider via in basket message after today's visit.  [name] is welcome to return to the Survivorship Clinic in the future, as needed; no follow-up will be scheduled at this time.    2. Tobacco abuse: We have discussed Katie Jacobson's continued tobacco abuse. She states that she was able to cut back on her cigarette smoking following her diagnosis, but as her anxiety has increased slightly upon completion of treatment and wondering, "what next?," she has increased her smoking again.  We have discussed strategies to quit or reduce her intake and she will consider this.  She states that she knows that she needs to stop smoking to improve her health.   3. Cancer screening:  Due to Katie Jacobson's history and her age, she should receive screening for skin cancers and colon cancer.  The information and recommendations are listed on the patient's comprehensive care plan/treatment summary and  were reviewed in detail with the patient.    4. Health maintenance and wellness promotion: Katie Jacobson was encouraged to consume 5-7 servings of fruits and vegetables per day. We reviewed the "Nutrition Rainbow" handout. She was also encouraged to engage in moderate to vigorous exercise for 30 minutes per day most days of the week. We discussed the [employer] fitness program, which is designed for cancer survivors to help them become more physically fit after cancer treatments.  She was instructed to limit her alcohol consumption and as above, was encouraged stop smoking.     5. Support services/counseling: It is not uncommon for this period of the patient's cancer care trajectory to be one of many emotions and stressors.  We discussed an opportunity for her to participate in the next session of [facility] ("Finding Your New Normal") support group series designed for patients after they have completed treatment.   Katie Jacobson was encouraged to take advantage of our many other support services programs, support groups, and/or counseling in coping with her new life as a cancer survivor  after completing anti-cancer treatment.  She was offered support today through active listening and expressive supportive counseling.  She was given information regarding our available services and encouraged to contact me with any questions or for help enrolling in any of our support group/programs.    A total of 50 minutes of face-to-face time was spent with this patient with greater than 50% of that time in counseling and care-coordination.   Sylvan Cheese, NP  Survivorship Program [facility] [phone]   Note: PRIMARY CARE PROVIDER Nance Pear., NP [phone]

## 2015-07-28 NOTE — Telephone Encounter (Signed)
FYI Patient called after AVS from cardiology showed an appointment today at [facility].  "Do I have an appointment, who is it with and why?"  Advised Survivorship appointment is to help her transition into life after cancer.  She will be here today as scheduled.

## 2015-07-28 NOTE — Progress Notes (Signed)
Met w/ pt regarding her bills.  Pt states she needs assistance with paying for her bills.  I looked at pt's billing and she owed $25.20 but she had $40 in undistributed so I emailed Bobbi in billing to move $25.20 to make her balance zero with [place].  She couldn't tell me where all of the bills were coming from so I offered to look thru them to find out which location they are coming from so she can call that facility and inquire about assistance.  I explained I cannot help her with bills from the hospital, I can only help her with bills from Dr. Jana Hakim.  Pt cannot apply for any grants with [place] because she's not currently in active treatment.  She will get those bills together and set up an appointment with me so we can go over where her bills are coming from.  She has my card for any additional questions or concerns.

## 2015-07-29 ENCOUNTER — Encounter: Payer: Self-pay | Admitting: Cardiology

## 2015-07-29 NOTE — Assessment & Plan Note (Signed)
Excellent control on beta blocker, ACE inhibitor and chlorthalidone. She just goes he's here to take one pill every other day as opposed to one half pill a day. As long as her pressure is as well-controlled as it is, I am fine with that.

## 2015-07-29 NOTE — Assessment & Plan Note (Signed)
She was referred to a dietitian, but she did not really follow up of that. At least her weight has remained stable.  The patient understands the need to lose weight with diet and exercise. We have discussed specific strategies for this.

## 2015-07-29 NOTE — Assessment & Plan Note (Signed)
Well-controlled on beta blocker. Apparently her insurance company has changed and she is now having issues with the co-pay for Toprol. Therefore we will switch her to Lopressor 25 mg twice a day.

## 2015-07-29 NOTE — Assessment & Plan Note (Signed)
Apparently her paper cigarette broke and now she is back to smoking. Discussed importance of him smoking cessation. She is "trying to quit" -- we discussed using patches or trying back on the vapor cigarette

## 2015-07-29 NOTE — Assessment & Plan Note (Signed)
Probably not quite where we would like her lipids to be for risk factor modification. I discussed importance of dietary discretion and exercise. If she still remains borderline in the future would consider potential statin therapy.

## 2015-08-04 ENCOUNTER — Encounter: Payer: Self-pay | Admitting: Cardiology

## 2015-08-10 ENCOUNTER — Other Ambulatory Visit: Payer: Self-pay | Admitting: Nurse Practitioner

## 2015-09-01 ENCOUNTER — Encounter: Payer: Self-pay | Admitting: Family

## 2015-09-01 ENCOUNTER — Ambulatory Visit (INDEPENDENT_AMBULATORY_CARE_PROVIDER_SITE_OTHER): Payer: 59 | Admitting: Family

## 2015-09-01 VITALS — BP 103/59 | HR 93 | Temp 98.3°F | Resp 16 | Ht 59.5 in | Wt 214.6 lb

## 2015-09-01 DIAGNOSIS — Z0000 Encounter for general adult medical examination without abnormal findings: Secondary | ICD-10-CM | POA: Diagnosis not present

## 2015-09-01 DIAGNOSIS — E2839 Other primary ovarian failure: Secondary | ICD-10-CM

## 2015-09-01 DIAGNOSIS — Z720 Tobacco use: Secondary | ICD-10-CM

## 2015-09-01 LAB — HEPATIC FUNCTION PANEL
ALK PHOS: 74 U/L (ref 39–117)
ALT: 12 U/L (ref 0–35)
AST: 13 U/L (ref 0–37)
Albumin: 3.8 g/dL (ref 3.5–5.2)
Bilirubin, Direct: 0.1 mg/dL (ref 0.0–0.3)
TOTAL PROTEIN: 7 g/dL (ref 6.0–8.3)
Total Bilirubin: 0.4 mg/dL (ref 0.2–1.2)

## 2015-09-01 LAB — URINALYSIS, ROUTINE W REFLEX MICROSCOPIC
BILIRUBIN URINE: NEGATIVE
Ketones, ur: NEGATIVE
Leukocytes, UA: NEGATIVE
NITRITE: NEGATIVE
PH: 5.5 (ref 5.0–8.0)
Specific Gravity, Urine: >=1.03 — AB (ref 1.000–1.030)
TOTAL PROTEIN, URINE-UPE24: NEGATIVE
URINE GLUCOSE: NEGATIVE
Urobilinogen, UA: 0.2 (ref 0.0–1.0)

## 2015-09-01 LAB — CBC WITH DIFFERENTIAL/PLATELET
BASOS ABS: 0 10*3/uL (ref 0.0–0.1)
Basophils Relative: 0.3 % (ref 0.0–3.0)
Eosinophils Absolute: 0.1 10*3/uL (ref 0.0–0.7)
Eosinophils Relative: 1 % (ref 0.0–5.0)
HCT: 40.2 % (ref 36.0–46.0)
Hemoglobin: 13.1 g/dL (ref 12.0–15.0)
Lymphocytes Relative: 23.8 % (ref 12.0–46.0)
Lymphs Abs: 1.4 10*3/uL (ref 0.7–4.0)
MCHC: 32.6 g/dL (ref 30.0–36.0)
MCV: 93.1 fl (ref 78.0–100.0)
MONO ABS: 0.3 10*3/uL (ref 0.1–1.0)
MONOS PCT: 5.4 % (ref 3.0–12.0)
NEUTROS ABS: 4.2 10*3/uL (ref 1.4–7.7)
NEUTROS PCT: 69.5 % (ref 43.0–77.0)
PLATELETS: 206 10*3/uL (ref 150.0–400.0)
RBC: 4.31 Mil/uL (ref 3.87–5.11)
RDW: 18.1 % — ABNORMAL HIGH (ref 11.5–15.5)
WBC: 6 10*3/uL (ref 4.0–10.5)

## 2015-09-01 LAB — BASIC METABOLIC PANEL
BUN: 20 mg/dL (ref 6–23)
CHLORIDE: 107 meq/L (ref 96–112)
CO2: 28 meq/L (ref 19–32)
Calcium: 9.2 mg/dL (ref 8.4–10.5)
Creatinine, Ser: 0.91 mg/dL (ref 0.40–1.20)
GFR: 81.96 mL/min (ref >60.00)
GLUCOSE: 71 mg/dL (ref 70–99)
Potassium: 3.7 mEq/L (ref 3.5–5.1)
SODIUM: 143 meq/L (ref 135–145)

## 2015-09-01 LAB — LIPID PANEL
CHOL/HDL RATIO: 3
Cholesterol: 204 mg/dL — ABNORMAL HIGH (ref 0–200)
HDL: 77.7 mg/dL (ref >39.00)
LDL CALC: 116 mg/dL — AB (ref 0–99)
NONHDL: 126.41
TRIGLYCERIDES: 53 mg/dL (ref 0.0–149.0)
VLDL: 10.6 mg/dL (ref 0.0–40.0)

## 2015-09-01 LAB — TSH: TSH: 1.14 u[IU]/mL (ref 0.35–4.50)

## 2015-09-01 NOTE — Progress Notes (Signed)
Subjective:    Patient ID: Katie Jacobson, female    DOB: 1956-04-21, 59 y.o.   MRN: [id number]  HPI  Katie Jacobson is a 59 yr old female who presents today for cpx.  Since her last visit she was diagnosed with breast cancer. She completed lumpectomy, chemo and radiation and reports feeling well.   Immunizations: tetanus up to date, flu shot up to date Diet: reports healthy diet.  Wt Readings from Last 3 Encounters:  09/01/15 214 lb 9.6 oz (97.342 kg)  07/28/15 218 lb 6.4 oz (99.066 kg)  07/27/15 214 lb (97.07 kg)  Exercise: active but not formal exercise.  Colonoscopy: 2012- due for follow up 10/17 Dexa: due Pap Smear: 1/16- GYN Mammogram: scheduled  12/16  Review of Systems  Constitutional: Negative for unexpected weight change.  HENT: Negative for rhinorrhea.   Respiratory: Negative for cough.   Cardiovascular: Negative for leg swelling.  Gastrointestinal: Negative for diarrhea and constipation.  Genitourinary: Positive for frequency.  Musculoskeletal:       Reports some aching in her legs  Skin: Negative for rash.  Psychiatric/Behavioral:       Denies depression   Past Medical History  Diagnosis Date  . Osteoporosis   . Mitral valve prolapse 1990  . Breast cancer of upper-outer quadrant of right female breast ([place]) 11/14/2014  . History of seizures     as a child - unknown cause - states was never on anticonvulsants  . History of kidney stones   . Seasonal allergies   . Hypertension     states under control with meds., has been on med. x "years"  . History of chemotherapy     finished chemo 02/03/2015  . Breast cancer, right breast [facility])     Social History   Social History  . Marital Status: Married    Spouse Name: N/A  . Number of Children: 2  . Years of Education: N/A   Occupational History  . Not on file.   Social History Main Topics  . Smoking status: Current Every Day Smoker -- 0.50 packs/day for 43 years    Types: Cigarettes  . Smokeless  tobacco: Never Used     Comment: 10 cig./day  . Alcohol Use: 0.6 - 2.4 oz/week    1-4 Standard drinks or equivalent per week  . Drug Use: No  . Sexual Activity: Not on file   Other Topics Concern  . Not on file   Social History Narrative   Married with 2 daughters- born 71 and 80   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  Has been doing part time work with [employer] -- had 6 active clients, but the most important one is her Daughter.   Regular exercise:  No   Caffeine Use: none; Current 1ppd smoker x > 39 yrs; EtOH ~4 oz/week    Past Surgical History  Procedure Laterality Date  . Appendectomy  1976  . Abdominal hysterectomy  1998    partial  . Tonsillectomy  1970s  . Knee arthroscopy Left 03/26/2008  . Tubal ligation  1996  . Salivary stone removal Right 1972  . Colonoscopy    . Portacath placement N/A 11/27/2014    Procedure: INSERTION PORT-A-CATH;  Surgeon: Rolm Bookbinder, MD;  Location: [place];  Service: General;  Laterality: N/A;  . Plantar's wart excision Left     x 2  . Radioactive seed guided mastectomy with axillary  sentinel lymph node biopsy Right 03/02/2015    Procedure: RADIOACTIVE SEED GUIDED RIGHT PARTIAL MASTECTOMY WITH RIGHT AXILLARY SENTINEL LYMPH NODE BIOPSY;  Surgeon: Rolm Bookbinder, MD;  Location: [place];  Service: General;  Laterality: Right;  . Port-a-cath removal Left 04/06/2015    Procedure: REMOVAL PORT-A-CATH;  Surgeon: Rolm Bookbinder, MD;  Location: [place];  Service: General;  Laterality: Left;  . Breast lumpectomy Right 02/2015    Family History  Problem Relation Age of Onset  . Heart disease Mother   . Stroke Mother   . Hypertension Mother   . Diabetes Mother   . Diabetes Sister   . Kidney disease Brother   . Mental retardation Brother   . Breast cancer Cousin     deceased 30  . Diabetes Sister   . Heart attack Father      Allergies  Allergen Reactions  . Sulfa Antibiotics Other (See Comments)    UNKNOWN    Current Outpatient Prescriptions on File Prior to Visit  Medication Sig Dispense Refill  . albuterol (PROVENTIL HFA;VENTOLIN HFA) 108 (90 BASE) MCG/ACT inhaler Inhale into the lungs every 6 (six) hours as needed for wheezing or shortness of breath.    Marland Kitchen aspirin 81 MG tablet Take 1 tablet (81 mg total) by mouth daily. 30 tablet 0  . chlorthalidone (HYGROTON) 25 MG tablet Take 1 tablet (25 mg total) by mouth every other day. 90 tablet 3  . enalapril (VASOTEC) 20 MG tablet Take 1 tablet (20 mg total) by mouth at bedtime. 90 tablet 3  . fluticasone (FLONASE) 50 MCG/ACT nasal spray Place 1 spray into both nostrils as needed.    . hyaluronate sodium (RADIAPLEXRX) GEL Apply 1 application topically as needed.     . metoprolol tartrate (LOPRESSOR) 25 MG tablet Take 1 tablet (25 mg total) by mouth 2 (two) times daily. 180 tablet 3   No current facility-administered medications on file prior to visit.    BP 103/59 mmHg  Pulse 93  Temp(Src) 98.3 F (36.8 C) (Oral)  Resp 16  Ht 4' 11.5" (1.511 m)  Wt 214 lb 9.6 oz (97.342 kg)  BMI 42.64 kg/m2  SpO2 100%  LMP 10/10/1996        Objective:   Physical Exam  Physical Exam  Constitutional: She is oriented to person, place, and time. She appears well-developed and well-nourished. No distress.  HENT:  Head: Normocephalic and atraumatic.  Right Ear: Tympanic membrane and ear canal normal.  Left Ear: Tympanic membrane and ear canal normal.  Mouth/Throat: Oropharynx is clear and moist.  Eyes: Pupils are equal, round, and reactive to light. No scleral icterus.  Neck: Normal range of motion. No thyromegaly present.  Cardiovascular: Normal rate and regular rhythm.   No murmur heard. Pulmonary/Chest: Effort normal and breath sounds normal. No respiratory distress. He has no wheezes. She has no rales. She exhibits no tenderness.  Abdominal: Soft. Bowel  sounds are normal. He exhibits no distension and no mass. There is no tenderness. There is no rebound and no guarding.  Musculoskeletal: She exhibits no edema.  Lymphadenopathy:    She has no cervical adenopathy.  Neurological: She is alert and oriented to person, place, and time. She has normal patellar reflexes. She exhibits normal muscle tone. Coordination normal.  Skin: Skin is warm and dry.  Psychiatric: She has a normal mood and affect. Her behavior is normal. Judgment and thought content normal.  Breast/pelvic: deferred       Assessment &  Plan:         Assessment & Plan:

## 2015-09-01 NOTE — Patient Instructions (Signed)
Please complete lab work prior to leaving.  Try to add 30 minutes of walking 5 days a week.  

## 2015-09-01 NOTE — Assessment & Plan Note (Signed)
Discussed importance of smoking cessation. 

## 2015-09-01 NOTE — Assessment & Plan Note (Signed)
Discussed healthy diet, exercise, weight loss. Will be due for colo next year. She has mammo scheduled.  Obtain routine lab work.

## 2015-09-01 NOTE — Progress Notes (Signed)
Pre visit review using our clinic review tool, if applicable. No additional management support is needed unless otherwise documented below in the visit note. 

## 2015-09-06 ENCOUNTER — Telehealth: Payer: Self-pay | Admitting: Family

## 2015-09-06 DIAGNOSIS — R3129 Other microscopic hematuria: Secondary | ICD-10-CM | POA: Insufficient documentation

## 2015-09-06 NOTE — Telephone Encounter (Signed)
Please let pt know that I reviewed her lab work.  Urine shows some microscopic blood.  I would like her to meet with urology for further evaluation.

## 2015-09-08 ENCOUNTER — Encounter: Payer: Self-pay | Admitting: Family

## 2015-09-08 ENCOUNTER — Telehealth: Payer: Self-pay | Admitting: Family

## 2015-09-08 ENCOUNTER — Ambulatory Visit (HOSPITAL_BASED_OUTPATIENT_CLINIC_OR_DEPARTMENT_OTHER)
Admission: RE | Admit: 2015-09-08 | Discharge: 2015-09-08 | Disposition: A | Discharge status: Home / Self Care | Payer: 59 | Source: Ambulatory Visit | Attending: Family | Admitting: Family

## 2015-09-08 ENCOUNTER — Other Ambulatory Visit (HOSPITAL_BASED_OUTPATIENT_CLINIC_OR_DEPARTMENT_OTHER): Payer: 59

## 2015-09-08 DIAGNOSIS — Z0000 Encounter for general adult medical examination without abnormal findings: Secondary | ICD-10-CM

## 2015-09-08 DIAGNOSIS — Z780 Asymptomatic menopausal state: Secondary | ICD-10-CM | POA: Diagnosis not present

## 2015-09-08 NOTE — Telephone Encounter (Signed)
Notified pt and she voices understanding. Pt agreeable to proceed with referral.

## 2015-09-08 NOTE — Telephone Encounter (Signed)
-----   Message from Katha Hamming sent at 09/08/2015  8:06 AM EST ----- Regarding: Bone density Hey Syeda Prickett:  Ms. Wildes is scheduled for her bone density today at 3 pm.  She states her insurance will not pay for this study unless it is coded preventive.   She is not the first patient that has told us this.  Larene Beach our dexa technologist said she was told that if you use the diagnosis of estrogen deficiency insurance would cover.  Ms.  Rosencrantz wants her order to say preventive.   I can't change the diagnosis.  Would you put in a new order with this diagnosis?    Thanks, Hoyle Sauer  [facility] - Imaging

## 2015-09-09 ENCOUNTER — Other Ambulatory Visit: Payer: Self-pay | Admitting: Nurse Practitioner

## 2015-09-09 ENCOUNTER — Encounter: Payer: Self-pay | Admitting: Adult Health

## 2015-09-09 ENCOUNTER — Encounter: Payer: Self-pay | Admitting: Family

## 2015-09-09 NOTE — Progress Notes (Signed)
A birthday card was mailed to the patient today on behalf of the Survivorship Program at [facility].   Ramesha Poster, NP Survivorship Program [facility] [phone]  

## 2015-09-14 ENCOUNTER — Encounter: Payer: Self-pay | Admitting: Family

## 2015-10-23 ENCOUNTER — Other Ambulatory Visit: Payer: Self-pay | Admitting: Oncology

## 2015-10-23 DIAGNOSIS — C50919 Malignant neoplasm of unspecified site of unspecified female breast: Secondary | ICD-10-CM

## 2015-10-29 ENCOUNTER — Ambulatory Visit
Admission: RE | Admit: 2015-10-29 | Discharge: 2015-10-29 | Disposition: A | Discharge status: Home / Self Care | Payer: BLUE CROSS/BLUE SHIELD | Source: Ambulatory Visit | Attending: Oncology | Admitting: Oncology

## 2015-10-29 DIAGNOSIS — C50919 Malignant neoplasm of unspecified site of unspecified female breast: Secondary | ICD-10-CM

## 2015-12-30 ENCOUNTER — Other Ambulatory Visit: Payer: Self-pay

## 2015-12-30 DIAGNOSIS — C50411 Malignant neoplasm of upper-outer quadrant of right female breast: Secondary | ICD-10-CM

## 2015-12-31 ENCOUNTER — Ambulatory Visit (HOSPITAL_BASED_OUTPATIENT_CLINIC_OR_DEPARTMENT_OTHER): Payer: BLUE CROSS/BLUE SHIELD | Admitting: Oncology

## 2015-12-31 ENCOUNTER — Other Ambulatory Visit (HOSPITAL_BASED_OUTPATIENT_CLINIC_OR_DEPARTMENT_OTHER): Payer: BLUE CROSS/BLUE SHIELD

## 2015-12-31 VITALS — BP 146/65 | HR 80 | Temp 97.9°F | Resp 19 | Ht 59.5 in | Wt 225.0 lb

## 2015-12-31 DIAGNOSIS — C50411 Malignant neoplasm of upper-outer quadrant of right female breast: Secondary | ICD-10-CM | POA: Diagnosis not present

## 2015-12-31 DIAGNOSIS — Z171 Estrogen receptor negative status [ER-]: Secondary | ICD-10-CM

## 2015-12-31 LAB — CBC WITH DIFFERENTIAL/PLATELET
BASO%: 1 % (ref 0.0–2.0)
Basophils Absolute: 0.1 10*3/uL (ref 0.0–0.1)
EOS%: 1.6 % (ref 0.0–7.0)
Eosinophils Absolute: 0.1 10*3/uL (ref 0.0–0.5)
HCT: 38 % (ref 34.8–46.6)
HGB: 12.6 g/dL (ref 11.6–15.9)
LYMPH#: 1.3 10*3/uL (ref 0.9–3.3)
LYMPH%: 22.9 % (ref 14.0–49.7)
MCH: 30.5 pg (ref 25.1–34.0)
MCHC: 33.1 g/dL (ref 31.5–36.0)
MCV: 92 fL (ref 79.5–101.0)
MONO#: 0.4 10*3/uL (ref 0.1–0.9)
MONO%: 7.7 % (ref 0.0–14.0)
NEUT#: 3.8 10*3/uL (ref 1.5–6.5)
NEUT%: 66.8 % (ref 38.4–76.8)
Platelets: 185 10*3/uL (ref 145–400)
RBC: 4.13 10*6/uL (ref 3.70–5.45)
RDW: 15.7 % — ABNORMAL HIGH (ref 11.2–14.5)
WBC: 5.7 10*3/uL (ref 3.9–10.3)

## 2015-12-31 LAB — COMPREHENSIVE METABOLIC PANEL
ALT: 13 U/L (ref 0–55)
ANION GAP: 8 meq/L (ref 3–11)
AST: 12 U/L (ref 5–34)
Albumin: 3.3 g/dL — ABNORMAL LOW (ref 3.5–5.0)
Alkaline Phosphatase: 74 U/L (ref 40–150)
BUN: 13.3 mg/dL (ref 7.0–26.0)
CHLORIDE: 108 meq/L (ref 98–109)
CO2: 25 mEq/L (ref 22–29)
Calcium: 9 mg/dL (ref 8.4–10.4)
Creatinine: 0.8 mg/dL (ref 0.6–1.1)
EGFR: >90 mL/min/{1.73_m2} (ref >90)
Glucose: 84 mg/dl (ref 70–140)
Potassium: 3.9 mEq/L (ref 3.5–5.1)
Sodium: 141 mEq/L (ref 136–145)
Total Bilirubin: 0.35 mg/dL (ref 0.20–1.20)
Total Protein: 6.7 g/dL (ref 6.4–8.3)

## 2015-12-31 NOTE — Progress Notes (Signed)
Katie Jacobson  [phone] Fax:([phone]) [phone]     ID: Katie Jacobson DOB: 1955/10/27  MR#: [id number]  CZY#:[id number]  Patient Care Team: Katie Alar, NP as PCP - General (Internal Medicine) Harle Battiest, MD (Obstetrics and Gynecology) Rolm Bookbinder, MD as Consulting Physician (General Surgery) Chauncey Cruel, MD as Consulting Physician (Oncology) Eppie Gibson, MD as Attending Physician (Radiation Oncology) Rockwell Germany, RN as Registered Nurse Mauro Kaufmann, RN as Registered Nurse Holley Bouche, NP as Nurse Practitioner (Nurse Practitioner) Sylvan Cheese, NP as Nurse Practitioner (Hematology and Oncology) OTHER MD:  CHIEF COMPLAINT: Triple negative breast cancer  CURRENT TREATMENT: observation  BREAST CANCER HISTORY: From the original intake note:  The patient herself palpated a mass in her right breast there is she brought it to her gynecologist, Dr. Alan Ripper attention and he obtained screening mammography which suggested an abnormality in the right breast. On 10/24/2014 the patient underwent right diagnostic mammography and ultrasonography at the breast Center. The breast density was category B. There was an ill-defined mass in the upper outer quadrant of the right breast which was palpable on exam. By ultrasound this was irregular and hypoechoic measuring 1.2 cm. Evaluation of the right axilla was negative.  Biopsy of the mass in question showed an invasive ductal carcinoma, grade 2 or 3, triple negative, with an MIB-1 of 19%.  Katie Jacobson case was presented at the multidisciplinary breast cancer conference 11/19/2014, where was suggested she would benefit from genetics testing. Since this would mandate a significant delay in her final surgery, neoadjuvant chemotherapy was indicated.  The patient's subsequent history is as detailed below  INTERVAL HISTORY: Katie Jacobson returns today for follow-up of her triple negative breast  cancer. She is generally doing well but there have been some financial issues since her husband left his job and she lost her. 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 She has by her account $15,000 on bills from medical treatments. She brought them to discuss with her financial counselor here today  REVIEW OF SYSTEMS: Katie Jacobson "keeps busy" but she is not exercising regularly. She likes to take walks but is not currently taking walks because the neighborhood they live in his dangerous she says she feels very tired. She gets muscle cramps. Sometimes her vision is a little blurry. She needs some dental health. Occasionally her ankles swell. She thinks her right upper extremity swelling a little bit more than before. She has stress urinary incontinence. Occasional she has cramps. Aside from these issues a detailed review of systems today was stable  PAST MEDICAL HISTORY: Past Medical History  Diagnosis Date  . Osteoporosis   . Mitral valve prolapse 1990  . Breast cancer of upper-outer quadrant of right female breast ([place]) 11/14/2014  . History of seizures     as a child - unknown cause - states was never on anticonvulsants  . History of kidney stones   . Seasonal allergies   . Hypertension     states under control with meds., has been on med. x "years"  . History of chemotherapy     finished chemo 02/03/2015  . Breast cancer, right breast ([place])     PAST SURGICAL HISTORY: Past Surgical History  Procedure Laterality Date  . Appendectomy  1976  . Abdominal hysterectomy  1998    partial  . Tonsillectomy  1970s  . Knee arthroscopy Left 03/26/2008  . Tubal ligation  1996  . Salivary stone removal  Right 1972  . Colonoscopy    . Portacath placement N/A 11/27/2014    Procedure: INSERTION PORT-A-CATH;  Surgeon: Rolm Bookbinder, MD;  Location: [facility];  Service: General;  Laterality: N/A;  . Plantar's wart excision Left     x 2  . Radioactive  seed guided mastectomy with axillary sentinel lymph node biopsy Right 03/02/2015    Procedure: RADIOACTIVE SEED GUIDED RIGHT PARTIAL MASTECTOMY WITH RIGHT AXILLARY SENTINEL LYMPH NODE BIOPSY;  Surgeon: Rolm Bookbinder, MD;  Location: [facility];  Service: General;  Laterality: Right;  . Port-a-cath removal Left 04/06/2015    Procedure: REMOVAL PORT-A-CATH;  Surgeon: Rolm Bookbinder, MD;  Location: [facility];  Service: General;  Laterality: Left;  . Breast lumpectomy Right 02/2015    FAMILY HISTORY Family History  Problem Relation Age of Onset  . Heart disease Mother   . Stroke Mother   . Hypertension Mother   . Diabetes Mother   . Diabetes Sister   . Kidney disease Brother   . Mental retardation Brother   . Breast cancer Cousin     deceased 58  . Diabetes Sister   . Heart attack Father    the patient knows little about her father. Her mother died at at the age of 1 following a stroke. The patient had 2 brothers, 2 sisters. The only history of breast or ovarian cancer in the family is a first cousin on the mother's side diagnosed with breast cancer at age 61  GYNECOLOGIC HISTORY:  Patient's last menstrual period was 10/10/1996. Menarche age 53, first live birth age 12. The patient is GX P2. She status post simple hysterectomy without salpingo-oophorectomy. She did not take hormone replacement.  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 The patient's husband, [name] works for [employer] in the pattern shop. He also works as a Dispensing optician. Daughter [name] lives in [place] and works in Programmer, applications. Daughter [name] lives in [place] and works at a [employer]. 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    ADVANCED DIRECTIVES: Not in place   HEALTH MAINTENANCE: Social History  Substance Use Topics  . Smoking status: Current Every Day Smoker -- 0.50  packs/day for 43 years    Types: Cigarettes  . Smokeless tobacco: Never Used     Comment: 10 cig./day  . Alcohol Use: 0.6 - 2.4 oz/week    1-4 Standard drinks or equivalent per week     Colonoscopy:  PAP:  Bone density:  Lipid panel:  Allergies  Allergen Reactions  . Sulfa Antibiotics Other (See Comments)    UNKNOWN    Current Outpatient Prescriptions  Medication Sig Dispense Refill  . albuterol (PROVENTIL HFA;VENTOLIN HFA) 108 (90 BASE) MCG/ACT inhaler Inhale into the lungs every 6 (six) hours as needed for wheezing or shortness of breath.    Marland Kitchen aspirin 81 MG tablet Take 1 tablet (81 mg total) by mouth daily. 30 tablet 0  . chlorthalidone (HYGROTON) 25 MG tablet Take 1 tablet (25 mg total) by mouth every other day. 90 tablet 3  . enalapril (VASOTEC) 20 MG tablet Take 1 tablet (20 mg total) by mouth at bedtime. 90 tablet 3  . fluticasone (FLONASE) 50 MCG/ACT nasal spray Place 1 spray into both nostrils as needed.    . metoprolol tartrate (LOPRESSOR) 25 MG tablet Take 1 tablet (25 mg total) by mouth 2 (two)  times daily. 180 tablet 3   No current facility-administered medications for this visit.    OBJECTIVE: Middle-aged African-American woman In no acute distress Filed Vitals:   12/31/15 1241  BP: 146/65  Pulse: 80  Temp: 97.9 F (36.6 C)  Resp: 19     Body mass index is 44.7 kg/(m^2).    ECOG FS:0 - Asymptomatic  Sclerae unicteric, pupils round and equal Oropharynx clear and moist-- no thrush or other lesions No cervical or supraclavicular adenopathy Lungs no rales or rhonchi Heart regular rate and rhythm Abd soft, obese, nontender, positive bowel sounds MSK no focal spinal tenderness, grade 1 right upper extremity lymphedema Neuro: nonfocal, well oriented, appropriate affect Breasts: The right breast is status post lumpectomy and radiation. There is some induration subjacent to the surgical scar but no findings suggestive of disease recurrence. There is still some  hyperpigmentation and some skin thickening from the radiation. The right axilla is benign. The left breast is unremarkable    LAB RESULTS:  CMP     Component Value Date/Time   NA 143 09/01/2015 1223   NA 142 06/18/2015 1158   K 3.7 09/01/2015 1223   K 4.1 06/18/2015 1158   CL 107 09/01/2015 1223   CO2 28 09/01/2015 1223   CO2 27 06/18/2015 1158   GLUCOSE 71 09/01/2015 1223   GLUCOSE 86 06/18/2015 1158   BUN 20 09/01/2015 1223   BUN 13.3 06/18/2015 1158   CREATININE 0.91 09/01/2015 1223   CREATININE 0.9 06/18/2015 1158   CREATININE 0.93 09/02/2013 1128   CALCIUM 9.2 09/01/2015 1223   CALCIUM 9.5 06/18/2015 1158   PROT 7.0 09/01/2015 1223   PROT 6.7 06/18/2015 1158   ALBUMIN 3.8 09/01/2015 1223   ALBUMIN 3.4* 06/18/2015 1158   AST 13 09/01/2015 1223   AST 12 06/18/2015 1158   ALT 12 09/01/2015 1223   ALT 11 06/18/2015 1158   ALKPHOS 74 09/01/2015 1223   ALKPHOS 82 06/18/2015 1158   BILITOT 0.4 09/01/2015 1223   BILITOT 0.45 06/18/2015 1158   GFRNONAA 76* 09/08/2013 1120   GFRNONAA 70 09/02/2013 1128   GFRAA 88* 09/08/2013 1120   GFRAA 81 09/02/2013 1128    INo results found for: SPEP, UPEP  Lab Results  Component Value Date   WBC 5.7 12/31/2015   NEUTROABS 3.8 12/31/2015   HGB 12.6 12/31/2015   HCT 38.0 12/31/2015   MCV 92.0 12/31/2015   PLT 185 12/31/2015      Chemistry      Component Value Date/Time   NA 143 09/01/2015 1223   NA 142 06/18/2015 1158   K 3.7 09/01/2015 1223   K 4.1 06/18/2015 1158   CL 107 09/01/2015 1223   CO2 28 09/01/2015 1223   CO2 27 06/18/2015 1158   BUN 20 09/01/2015 1223   BUN 13.3 06/18/2015 1158   CREATININE 0.91 09/01/2015 1223   CREATININE 0.9 06/18/2015 1158   CREATININE 0.93 09/02/2013 1128      Component Value Date/Time   CALCIUM 9.2 09/01/2015 1223   CALCIUM 9.5 06/18/2015 1158   ALKPHOS 74 09/01/2015 1223   ALKPHOS 82 06/18/2015 1158   AST 13 09/01/2015 1223   AST 12 06/18/2015 1158   ALT 12 09/01/2015 1223     ALT 11 06/18/2015 1158   BILITOT 0.4 09/01/2015 1223   BILITOT 0.45 06/18/2015 1158       No results found for: LABCA2  No components found for: LABCA125  No results for input(s): INR in the  last 168 hours.  Urinalysis    Component Value Date/Time   COLORURINE YELLOW 09/01/2015 [place] 09/01/2015 1223   LABSPEC >=1.030* 09/01/2015 1223   PHURINE 5.5 09/01/2015 1223   GLUCOSEU NEGATIVE 09/01/2015 1223   GLUCOSEU NEGATIVE 09/08/2013 1258   HGBUR SMALL* 09/01/2015 1223   BILIRUBINUR NEGATIVE 09/01/2015 1223   KETONESUR NEGATIVE 09/01/2015 1223   PROTEINUR NEGATIVE 09/08/2013 1258   UROBILINOGEN 0.2 09/01/2015 1223   NITRITE NEGATIVE 09/01/2015 1223   LEUKOCYTESUR NEGATIVE 09/01/2015 1223    STUDIES: CLINICAL DATA: Routine postlumpectomy follow-up. Patient has history of a right breast lumpectomy in May of 2016.  EXAM: DIGITAL DIAGNOSTIC BILATERAL MAMMOGRAM WITH 3D TOMOSYNTHESIS AND CAD  COMPARISON: Previous exam(s).  ACR Breast Density Category b: There are scattered areas of fibroglandular density.  FINDINGS: Interval surgical changes noted in the superior right breast. No evidence of disease recurrence. No suspicious calcifications, masses or areas of distortion are seen in the bilateral breasts.  Mammographic images were processed with CAD.  IMPRESSION: 1. Interval postlumpectomy changes in the superior right breast without evidence of disease recurrence.  2. No mammographic evidence of malignancy in the bilateral breasts.  RECOMMENDATION: Diagnostic mammogram is suggested in 1 year. (Code:DM-B-01Y)  I have discussed the findings and recommendations with the patient. Results were also provided in writing at the conclusion of the visit. If applicable, a reminder letter will be sent to the patient regarding the next appointment.  BI-RADS CATEGORY 2: Benign.   Electronically Signed  By: Ammie Ferrier M.D.   On: 10/29/2015 11:45  ASSESSMENT: 60 y.o. [place], [place] woman status post right breast upper outer quadrant biopsy 11/12/2014 for a clinical T1c N0, stage IA invasive ductal carcinoma, triple negative, with an MIB-1 of 19%  (1) neoadjuvant chemotherapy consisting of cyclophosphamide and docetaxel 4 given every 21 days with Neulasta support, first dose 12/02/2014, completed 02/03/2015  (2) genetics testing through the [facility] gene panel at [facility] showed no deleterious mutations in ATM, BARD1, BRCA1, BRCA2, BRIP1, CDH1, CHEK2, MRE11A, MUTYH, NBN, NF1, PALB2, PTEN, RAD50, RAD51C, RAD51D, oe TP53.  (3) status post right lumpectomy and sentinel node sampling 03/02/2015 for a ypT1b ypN0 stage IA invasive ductal carcinoma, grade 1, with negative margins. Repeat HER-2 was again not amplified.  (4) radiation completed August 2016  (5) continuing tobacco abuse. The patient has been strongly advised to quit smoking  PLAN: Suttyn is now a year out from definitive surgery for her triple negative breast cancer, with no evidence of disease recurrence. This is very favorable.  I don't understand some of the things she tells me about her bills. She tells me are adviser told her she didn't have any pending depths in fact had a credit but she keeps getting bills adding up to $15,000. She will meet with our adviser again today and she did bring her bills to go over those in more detail.  She is not exercising regularly. I gave her information on the [employer] program and also the [facility] program. I urged her to make use of this programs we have available for free here including tae kwon do and yoga. We also had a massage program she is welcome to participate in.  She did not want to go back to physical therapy for the right upper extremity lymphedema. This is very mild. She is concerned about cost. She will let us know if she changes her mind in that regard.  As far as her  stress urinary incontinence is concerned I suggested she keep her bladder empty by going to the bathroom every 2 hours whether she feels like she needs to or not. Otherwise we can make a referral to urology.  She will see [place] again in 6 months. She knows to call for any problems that may develop before that visit.  Chauncey Cruel, MD   12/31/2015 12:48 PM

## 2016-01-01 ENCOUNTER — Telehealth: Payer: Self-pay | Admitting: Oncology

## 2016-01-01 NOTE — Telephone Encounter (Signed)
lvm for pt regarding to sept appt... °

## 2016-01-25 ENCOUNTER — Encounter: Payer: Self-pay | Admitting: Genetic Counselor

## 2016-01-25 DIAGNOSIS — Z1379 Encounter for other screening for genetic and chromosomal anomalies: Secondary | ICD-10-CM | POA: Insufficient documentation

## 2016-02-01 ENCOUNTER — Other Ambulatory Visit: Payer: Self-pay | Admitting: Cardiology

## 2016-02-02 NOTE — Telephone Encounter (Signed)
Rx(s) sent to pharmacy electronically.  

## 2016-06-29 ENCOUNTER — Other Ambulatory Visit: Payer: Self-pay | Admitting: *Deleted

## 2016-06-29 DIAGNOSIS — C50411 Malignant neoplasm of upper-outer quadrant of right female breast: Secondary | ICD-10-CM

## 2016-06-30 ENCOUNTER — Other Ambulatory Visit (HOSPITAL_BASED_OUTPATIENT_CLINIC_OR_DEPARTMENT_OTHER): Payer: BLUE CROSS/BLUE SHIELD

## 2016-06-30 ENCOUNTER — Ambulatory Visit (HOSPITAL_BASED_OUTPATIENT_CLINIC_OR_DEPARTMENT_OTHER): Payer: BLUE CROSS/BLUE SHIELD | Admitting: Oncology

## 2016-06-30 VITALS — BP 128/73 | HR 73 | Temp 98.5°F | Resp 18 | Ht 59.5 in | Wt 214.9 lb

## 2016-06-30 DIAGNOSIS — C50411 Malignant neoplasm of upper-outer quadrant of right female breast: Secondary | ICD-10-CM | POA: Diagnosis not present

## 2016-06-30 DIAGNOSIS — Z720 Tobacco use: Secondary | ICD-10-CM | POA: Diagnosis not present

## 2016-06-30 LAB — COMPREHENSIVE METABOLIC PANEL
ALBUMIN: 3.4 g/dL — AB (ref 3.5–5.0)
ALK PHOS: 88 U/L (ref 40–150)
ALT: 15 U/L (ref 0–55)
AST: 14 U/L (ref 5–34)
Anion Gap: 11 mEq/L (ref 3–11)
BILIRUBIN TOTAL: 0.38 mg/dL (ref 0.20–1.20)
BUN: 12.5 mg/dL (ref 7.0–26.0)
CO2: 24 mEq/L (ref 22–29)
CREATININE: 0.9 mg/dL (ref 0.6–1.1)
Calcium: 9.7 mg/dL (ref 8.4–10.4)
Chloride: 106 mEq/L (ref 98–109)
EGFR: 84 mL/min/{1.73_m2} — AB (ref >90)
GLUCOSE: 85 mg/dL (ref 70–140)
Potassium: 4.3 mEq/L (ref 3.5–5.1)
SODIUM: 141 meq/L (ref 136–145)
TOTAL PROTEIN: 7.4 g/dL (ref 6.4–8.3)

## 2016-06-30 LAB — CBC WITH DIFFERENTIAL/PLATELET
BASO%: 0.6 % (ref 0.0–2.0)
Basophils Absolute: 0 10*3/uL (ref 0.0–0.1)
EOS ABS: 0.1 10*3/uL (ref 0.0–0.5)
EOS%: 1.3 % (ref 0.0–7.0)
HCT: 42.2 % (ref 34.8–46.6)
HEMOGLOBIN: 14.1 g/dL (ref 11.6–15.9)
LYMPH%: 27.9 % (ref 14.0–49.7)
MCH: 31.3 pg (ref 25.1–34.0)
MCHC: 33.5 g/dL (ref 31.5–36.0)
MCV: 93.4 fL (ref 79.5–101.0)
MONO#: 0.5 10*3/uL (ref 0.1–0.9)
MONO%: 7.2 % (ref 0.0–14.0)
NEUT%: 63 % (ref 38.4–76.8)
NEUTROS ABS: 4.2 10*3/uL (ref 1.5–6.5)
Platelets: 223 10*3/uL (ref 145–400)
RBC: 4.52 10*6/uL (ref 3.70–5.45)
RDW: 16.2 % — AB (ref 11.2–14.5)
WBC: 6.7 10*3/uL (ref 3.9–10.3)
lymph#: 1.9 10*3/uL (ref 0.9–3.3)

## 2016-06-30 NOTE — Progress Notes (Signed)
[place]  [phone] [phone]     ID: Katie [place]son DOB: 07-Aug-1956  MR#: [id number]  TGP#:[id number]  Patient Care Team: Katie Alar, NP as PCP - General (Internal Medicine) Katie Battiest, MD (Obstetrics and Gynecology) Katie Bookbinder, MD as Consulting Physician (General Surgery) Katie Cruel, MD as Consulting Physician (Oncology) Katie Gibson, MD as Attending Physician (Radiation Oncology) Katie Germany, RN as Registered Nurse Katie Kaufmann, RN as Registered Nurse Katie Bouche, NP as Nurse Practitioner (Nurse Practitioner) Katie Cheese, NP as Nurse Practitioner (Hematology and Oncology) OTHER MD:  CHIEF COMPLAINT: Triple negative breast cancer  CURRENT TREATMENT: observation  BREAST CANCER HISTORY: From the original intake note:  The patient herself palpated a mass in her right breast there is she brought it to her gynecologist, Katie [place] attention and he obtained screening mammography which suggested an abnormality in the right breast. On 10/24/2014 the patient underwent right diagnostic mammography and ultrasonography at the breast Center. The breast density was category B. There was an ill-defined mass in the upper outer quadrant of the right breast which was palpable on exam. By ultrasound this was irregular and hypoechoic measuring 1.2 cm. Evaluation of the right axilla was negative.  Biopsy of the mass in question showed an invasive ductal carcinoma, grade 2 or 3, triple negative, with an MIB-1 of 19%.  Katie [place] case was presented at the multidisciplinary breast cancer conference 11/19/2014, where was suggested she would benefit from genetics testing. Since this would mandate a significant delay in her final surgery, neoadjuvant chemotherapy was indicated.  The patient's subsequent history is as detailed below  INTERVAL HISTORY: Katie [place] today for follow-up of her breast cancer. Since her  last visit here she has had the terrible experience of losing her younger Katie Katie [place] any trouble car accident. She brought [place] a copy of this celebration of life 03/02/2016  REVIEW OF SYSTEMS: Katie [place] has occasional pains in her right breast and this worries her. She is not exercising. She has a very strange schedule because she is now helping her husband who isn't DJD. She will start a job at 11 PM at work until 2 AM and then get to bed around 5 AM. She is accordingly moderately fatigued. She thinks she needs new glasses. Occasionally she has palpitations, particularly when walking up stairs, but no chest pain or pressure. She does have joint pains which are not more intense or persistent than before. She bruises easily. She admits to anxiety and depression and of course she is in active grief. She has hot flashes. A detailed review of systems was otherwise stable  PAST MEDICAL HISTORY: Past Medical History:  Diagnosis Date  . Breast cancer of upper-outer quadrant of right female breast ([place]) 11/14/2014  . Breast cancer, right breast ([place])   . History of chemotherapy    finished chemo 02/03/2015  . History of kidney stones   . History of seizures    as a child - unknown cause - states was never on anticonvulsants  . Hypertension    states under control with meds., has been on med. x "years"  . Mitral valve prolapse 1990  . Osteoporosis   . Seasonal allergies     PAST SURGICAL HISTORY: Past Surgical History:  Procedure Laterality Date  . ABDOMINAL HYSTERECTOMY  1998   partial  . APPENDECTOMY  1976  . BREAST LUMPECTOMY Right 02/2015  . COLONOSCOPY    . KNEE ARTHROSCOPY Left 03/26/2008  .  PLANTAR'S WART EXCISION Left    x 2  . PORT-A-CATH REMOVAL Left 04/06/2015   Procedure: REMOVAL PORT-A-CATH;  Surgeon: Katie Bookbinder, MD;  Location: [facility];  Service: General;  Laterality: Left;  . PORTACATH PLACEMENT N/A 11/27/2014   Procedure: INSERTION PORT-A-CATH;  Surgeon:  Katie Bookbinder, MD;  Location: [facility];  Service: General;  Laterality: N/A;  . RADIOACTIVE SEED GUIDED MASTECTOMY WITH AXILLARY SENTINEL LYMPH NODE BIOPSY Right 03/02/2015   Procedure: RADIOACTIVE SEED GUIDED RIGHT PARTIAL MASTECTOMY WITH RIGHT AXILLARY SENTINEL LYMPH NODE BIOPSY;  Surgeon: Katie Bookbinder, MD;  Location: [facility];  Service: General;  Laterality: Right;  . SALIVARY STONE REMOVAL Right 1972  . TONSILLECTOMY  1970s  . TUBAL LIGATION  1996    FAMILY HISTORY Family History  Problem Relation Age of Onset  . Heart disease Mother   . Stroke Mother   . Hypertension Mother   . Diabetes Mother   . Diabetes Sister   . Kidney disease Brother   . Mental retardation Brother   . Breast cancer Cousin     deceased 56  . Diabetes Sister   . Heart attack Father    the patient knows little about her father. Her mother died at at the age of 47 following a stroke. The patient had 2 brothers, 2 sisters. The only history of breast or ovarian cancer in the family is a first cousin on the mother's side diagnosed with breast cancer at age 51  GYNECOLOGIC HISTORY:  Patient's last menstrual period was 10/10/1996. Menarche age 57, first live birth age 41. The patient is GX P2. She status post simple hysterectomy without salpingo-oophorectomy. She did not take hormone replacement.  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 The patient's husband, Katie [place] works for [employer] in the pattern shop. He also works as a Dispensing optician. Katie [place]son lives in [place] and works in Programmer, applications. Katie Katie [place] lives in [place] and works at a [employer]. 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    ADVANCED DIRECTIVES: Not in place   HEALTH MAINTENANCE: Social History  Substance Use Topics  . Smoking status: Current Every Day Smoker    Packs/day: 0.50    Years:  43.00    Types: Cigarettes  . Smokeless tobacco: Never Used     Comment: 10 cig./day  . Alcohol use 0.6 - 2.4 oz/week    1 - 4 Standard drinks or equivalent per week     Colonoscopy:  PAP:  Bone density:  Lipid panel:  Allergies  Allergen Reactions  . Sulfa Antibiotics Other (See Comments)    UNKNOWN    Current Outpatient Prescriptions  Medication Sig Dispense Refill  . albuterol (PROVENTIL HFA;VENTOLIN HFA) 108 (90 BASE) MCG/ACT inhaler Inhale into the lungs every 6 (six) hours as needed for wheezing or shortness of breath.    Marland Kitchen aspirin 81 MG tablet Take 1 tablet (81 mg total) by mouth daily. 30 tablet 0  . chlorthalidone (HYGROTON) 25 MG tablet Take 1 tablet (25 mg total) by mouth every other day. 90 tablet 3  . enalapril (VASOTEC) 20 MG tablet TAKE 1 TABLET BY MOUTH EVERY NIGHT AT BEDTIME 30 tablet 6  . fluticasone (FLONASE) 50 MCG/ACT nasal spray Place 1 spray into both nostrils as needed.    . metoprolol tartrate (LOPRESSOR) 25 MG tablet Take 1 tablet (25 mg total)  by mouth 2 (two) times daily. 180 tablet 3   No current facility-administered medications for this visit.     OBJECTIVE: Middle-aged African-American woman Who appears stated age 60:   06/30/16 1246  BP: 128/73  Pulse: 73  Resp: 18  Temp: 98.5 F (36.9 C)     Body mass index is 42.68 kg/m.    ECOG FS:0 - Asymptomatic  Sclerae unicteric, EOMs intact Oropharynx clear and moist No cervical or supraclavicular adenopathy Lungs no rales or rhonchi Heart regular rate and rhythm Abd soft, nontender, positive bowel sounds MSK no focal spinal tenderness, no upper extremity lymphedema Neuro: nonfocal, well oriented, appropriate affect Breasts: The right breast is status post lumpectomy. It is also status post radiation. There is some skin thickening and hyperpigmentation as expected. There is no evidence of disease recurrence. The right axilla is benign. The left breast is unremarkable.     LAB  RESULTS:  CMP     Component Value Date/Time   NA 141 06/30/2016 1145   K 4.3 06/30/2016 1145   CL 107 09/01/2015 1223   CO2 24 06/30/2016 1145   GLUCOSE 85 06/30/2016 1145   BUN 12.5 06/30/2016 1145   CREATININE 0.9 06/30/2016 1145   CALCIUM 9.7 06/30/2016 1145   PROT 7.4 06/30/2016 1145   ALBUMIN 3.4 (L) 06/30/2016 1145   AST 14 06/30/2016 1145   ALT 15 06/30/2016 1145   ALKPHOS 88 06/30/2016 1145   BILITOT 0.38 06/30/2016 1145   GFRNONAA 76 (L) 09/08/2013 1120   GFRNONAA 70 09/02/2013 1128   GFRAA 88 (L) 09/08/2013 1120   GFRAA 81 09/02/2013 1128    INo results found for: SPEP, UPEP  Lab Results  Component Value Date   WBC 6.7 06/30/2016   NEUTROABS 4.2 06/30/2016   HGB 14.1 06/30/2016   HCT 42.2 06/30/2016   MCV 93.4 06/30/2016   PLT 223 06/30/2016      Chemistry      Component Value Date/Time   NA 141 06/30/2016 1145   K 4.3 06/30/2016 1145   CL 107 09/01/2015 1223   CO2 24 06/30/2016 1145   BUN 12.5 06/30/2016 1145   CREATININE 0.9 06/30/2016 1145      Component Value Date/Time   CALCIUM 9.7 06/30/2016 1145   ALKPHOS 88 06/30/2016 1145   AST 14 06/30/2016 1145   ALT 15 06/30/2016 1145   BILITOT 0.38 06/30/2016 1145       No results found for: LABCA2  No components found for: LABCA125  No results for input(s): INR in the last 168 hours.  Urinalysis    Component Value Date/Time   COLORURINE YELLOW 09/01/2015 [place] 09/01/2015 1223   LABSPEC >=1.030 (A) 09/01/2015 1223   PHURINE 5.5 09/01/2015 1223   GLUCOSEU NEGATIVE 09/01/2015 1223   HGBUR SMALL (A) 09/01/2015 1223   BILIRUBINUR NEGATIVE 09/01/2015 1223   KETONESUR NEGATIVE 09/01/2015 1223   PROTEINUR NEGATIVE 09/08/2013 1258   UROBILINOGEN 0.2 09/01/2015 1223   NITRITE NEGATIVE 09/01/2015 1223   LEUKOCYTESUR NEGATIVE 09/01/2015 1223    STUDIES: CLINICAL DATA: Routine postlumpectomy follow-up. Patient has history of a right breast lumpectomy in May of  2016.  EXAM: DIGITAL DIAGNOSTIC BILATERAL MAMMOGRAM WITH 3D TOMOSYNTHESIS AND CAD  COMPARISON: Previous exam(s).  ACR Breast Density Category b: There are scattered areas of fibroglandular density.  FINDINGS: Interval surgical changes noted in the superior right breast. No evidence of disease recurrence. No suspicious calcifications, masses or areas of distortion are seen in  the bilateral breasts.  Mammographic images were processed with CAD.  IMPRESSION: 1. Interval postlumpectomy changes in the superior right breast without evidence of disease recurrence.  2. No mammographic evidence of malignancy in the bilateral breasts.  RECOMMENDATION: Diagnostic mammogram is suggested in 1 year. (Code:DM-B-01Y)  I have discussed the findings and recommendations with the patient. Results were also provided in writing at the conclusion of the visit. If applicable, a reminder letter will be sent to the patient regarding the next appointment.  BI-RADS CATEGORY 2: Benign.   Electronically Signed  By: Ammie Ferrier M.D.  On: 10/29/2015 11:45  ASSESSMENT: 60 y.o. [place], [place] woman status post right breast upper outer quadrant biopsy 11/12/2014 for a clinical T1c N0, stage IA invasive ductal carcinoma, triple negative, with an MIB-1 of 19%  (1) neoadjuvant chemotherapy consisting of cyclophosphamide and docetaxel 4 given every 21 days with Neulasta support, first dose 12/02/2014, completed 02/03/2015  (2) genetics testing through the [facility] gene panel at [facility] showed no deleterious mutations in ATM, BARD1, BRCA1, BRCA2, BRIP1, CDH1, CHEK2, MRE11A, MUTYH, NBN, NF1, PALB2, PTEN, RAD50, RAD51C, RAD51D, oe TP53.  (3) status post right lumpectomy and sentinel node sampling 03/02/2015 for a ypT1b ypN0 stage IA invasive ductal carcinoma, grade 1, with negative margins. Repeat HER-2 was again not amplified.  (4) radiation completed August  2016  (5) continuing tobacco abuse. The patient has been strongly advised to quit smoking  PLAN: [place] is grieving appropriately and is managing to continue to function despite her terrible loss. I think she would benefit from grief counseling and she agrees to her referral. We will ask hospice to give her a call.  She is now a little over a year from definitive surgery for her breast cancer with no evidence of disease recurrence. This is very favorable she will have her next mammogram in [place]. She will see me again in March. From that point I will start seeing her on a once a year basis.  I think it would be helpful if she started exercise program. I gave her information on [employer] and also R tai chi classes here. I strongly encouraged her to participate.  She knows to call for any problems that may develop before her next visit here.  Katie Cruel, MD   06/30/2016 12:50 PM

## 2016-07-26 ENCOUNTER — Ambulatory Visit (INDEPENDENT_AMBULATORY_CARE_PROVIDER_SITE_OTHER): Payer: BLUE CROSS/BLUE SHIELD | Admitting: Cardiology

## 2016-07-26 ENCOUNTER — Encounter: Payer: Self-pay | Admitting: Cardiology

## 2016-07-26 VITALS — BP 122/70 | HR 82 | Ht 59.0 in | Wt 211.0 lb

## 2016-07-26 DIAGNOSIS — E785 Hyperlipidemia, unspecified: Secondary | ICD-10-CM

## 2016-07-26 DIAGNOSIS — I10 Essential (primary) hypertension: Secondary | ICD-10-CM | POA: Diagnosis not present

## 2016-07-26 DIAGNOSIS — R002 Palpitations: Secondary | ICD-10-CM

## 2016-07-26 DIAGNOSIS — Z720 Tobacco use: Secondary | ICD-10-CM | POA: Diagnosis not present

## 2016-07-26 DIAGNOSIS — E669 Obesity, unspecified: Secondary | ICD-10-CM

## 2016-07-26 NOTE — Progress Notes (Signed)
PCP: Nance Pear., NP  Clinic Note: Chief Complaint  Patient presents with  . Follow-up    1 year. Cardiac RFs    HPI: Katie [place] is a 60 y.o. female with a PMH below who presents today for delayed annual f/u: HTN, HLD, Palpitations.  Katie [place] was last seen by me in October 2016 Was doing relatively well, stable. Start metoprolol 25 twice a day initial dose when necessary.   Recent Hospitalizations:  -- undergoing Rx for Breast Cancer  - s/p Lumpectomy May 2016 he has now had chemotherapy and radiation that was completed at the end of September. She still has not fully recovered from chemotherapy and radiation, stating that she still is a bit tired.   Studies Reviewed: no new studies  Interval History: Katie [place] presents today doing relatively well.  From a cardiac standpoint she seems relatively stable. No major complaints. She has occasional fluttering in her chest but nothing last more than a couple seconds. No prolonged palpitations. No headaches or blurred vision.  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 Despite undergoing chemotherapy and radiation she still was able to do all of her daily living activities without much issue besides feeling tired -- this is getting better.  She just feels "out of shape" - gets a bit SOB & tired doing things that she was able to do before -- no CP symptoms. Palpitations - well controlled since starting BB last visit.  Her most pressing issue currently is mourning the loss of her 28 y/o daughter (who had just graduated from her second [employer]) - she was in a car accident, ejected from the car as it rolled ~7 times.  Suffered TBI & never woke up.  Starting to finally get herself back "into the swing of things".  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   Not really interested in talking smoking cessation, even though she knows that she should quit -- just not good  timing.  Cardiovascular Review of Symptoms: No chest pain or shortness of breath with rest or exertion.  No PND, orthopnea or edema.  No lightheadedness, dizziness, weakness or syncope/near syncope. No TIA/amaurosis fugax symptoms. No claudication.  ROS: A comprehensive was performed. Review of Systems  Constitutional: Negative for malaise/fatigue.  HENT: Negative for nosebleeds.   Respiratory: Negative for cough and shortness of breath.   Cardiovascular: Negative for claudication.       Per HPI  Gastrointestinal: Negative for blood in stool and melena.  Genitourinary: Negative for hematuria.  Musculoskeletal: Positive for joint pain (Mild aches and pains).  Neurological: Positive for dizziness (Sometimes positional).  Endo/Heme/Allergies: Bruises/bleeds easily.  Psychiatric/Behavioral: Negative.  Negative for memory loss. The patient is not nervous/anxious and does not have insomnia.        Mourning her daughter's death.   But able to be happy at her older daughter's wedding  All other systems reviewed and are negative.   Past Medical History:  Diagnosis Date  . Breast cancer of upper-outer quadrant of right female breast ([place]) 11/14/2014  . Breast cancer, right breast ([place])   . History of chemotherapy    finished chemo 02/03/2015  . History of kidney stones   . History of seizures    as a child - unknown cause - states was never on anticonvulsants  . Hypertension    states under control with meds., has been on med. x "years"  . Mitral  valve prolapse 1990  . Osteoporosis   . Seasonal allergies     Past Surgical History:  Procedure Laterality Date  . ABDOMINAL HYSTERECTOMY  1998   partial  . APPENDECTOMY  1976  . BREAST LUMPECTOMY Right 02/2015  . COLONOSCOPY    . KNEE ARTHROSCOPY Left 03/26/2008  . PLANTAR'S WART EXCISION Left    x 2  . PORT-A-CATH REMOVAL Left 04/06/2015   Procedure: REMOVAL PORT-A-CATH;  Surgeon: Rolm Bookbinder, MD;  Location: [facility];  Service: General;  Laterality: Left;  . PORTACATH PLACEMENT N/A 11/27/2014   Procedure: INSERTION PORT-A-CATH;  Surgeon: Rolm Bookbinder, MD;  Location: [facility];  Service: General;  Laterality: N/A;  . RADIOACTIVE SEED GUIDED MASTECTOMY WITH AXILLARY SENTINEL LYMPH NODE BIOPSY Right 03/02/2015   Procedure: RADIOACTIVE SEED GUIDED RIGHT PARTIAL MASTECTOMY WITH RIGHT AXILLARY SENTINEL LYMPH NODE BIOPSY;  Surgeon: Rolm Bookbinder, MD;  Location: [facility];  Service: General;  Laterality: Right;  . SALIVARY STONE REMOVAL Right 1972  . TONSILLECTOMY  1970s  . TUBAL LIGATION  1996    Prior to Admission medications   Medication Sig Start Date End Date Taking? Authorizing Provider  albuterol (PROVENTIL HFA;VENTOLIN HFA) 108 (90 BASE) MCG/ACT inhaler Inhale into the lungs every 6 (six) hours as needed for wheezing or shortness of breath.   Yes Historical Provider, MD  aspirin 81 MG tablet Take 1 tablet (81 mg total) by mouth daily. 04/21/15  Yes Chauncey Cruel, MD  chlorthalidone (HYGROTON) 25 MG tablet Take 1 tablet (25 mg total) by mouth every other day. 12/25/14  Yes Leonie Man, MD  enalapril (VASOTEC) 20 MG tablet TAKE 1 TABLET BY MOUTH AT BEDTIME 04/20/15  Yes Leonie Man, MD  fluticasone [facility]) 50 MCG/ACT nasal spray Place 1 spray into both nostrils as needed. 07/12/14  Yes Historical Provider, MD  hyaluronate sodium (RADIAPLEXRX) GEL Apply 1 application topically as needed.    Yes Historical Provider, MD  metoprolol succinate (TOPROL-XL) 50 MG 24 hr tablet TAKE 1 TABLET BY MOUTH AT BEDTIME 04/20/15  Yes Leonie Man, MD    Allergies  Allergen Reactions  . Sulfa Antibiotics Other (See Comments)    UNKNOWN    Social History   Social History  . Marital status: Married    Spouse name: N/A  . Number of children: 2  . Years of education: N/A   Social History Main Topics  . Smoking status: Current Every Day Smoker    Packs/day:  0.50    Years: 43.00    Types: Cigarettes  . Smokeless tobacco: Never Used     Comment: 10 cig./day  . Alcohol use 0.6 - 2.4 oz/week    1 - 4 Standard drinks or equivalent per week  . Drug use: No  . Sexual activity: Not Asked   Other Topics Concern  . 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  Has been doing part time work with [employer] -- had 6 active clients, but the most important one is her Daughter.   Regular exercise:  No   Caffeine Use: none; Current 1ppd smoker x > 39 yrs; EtOH ~4 oz/week   Family History  Problem Relation Age of Onset  . Heart disease Mother   . Stroke Mother   . Hypertension Mother   . Diabetes  Mother   . Diabetes Sister   . Kidney disease Brother   . Mental retardation Brother   . Diabetes Sister   . Heart attack Father   . Breast cancer Cousin     deceased 65    Wt Readings from Last 3 Encounters:  07/26/16 95.7 kg (211 lb)  06/30/16 97.5 kg (214 lb 14.4 oz)  12/31/15 102.1 kg (225 lb)  last Year 03/2014: 215 lb  Eating better.  Not really exercising routinely.  Has to lift heavy DJ equipment & carry around.  Hopes to start walking now that weather is colder.   PHYSICAL EXAM BP 122/70   Pulse 82   Ht 4\' 11"  (1.499 m)   Wt 95.7 kg (211 lb)   LMP 10/10/1996   BMI 42.62 kg/m  General appearance: alert, cooperative, appears stated age, no distress and moderately obese HEENT: [place]/AT, EOMI, MMM, anicteric sclera Neck: no adenopathy, no carotid bruit, no JVD and supple, symmetrical, trachea midline Lungs: clear to auscultation bilaterally, normal percussion bilaterally and non-labored, good air movement Heart: normal apical impulse, regular rate and rhythm, S1, S2 normal and 1/2 c-d SEM @ base without radiation; no R/G Abdomen: soft, non-tender; bowel sounds normal; no masses, no organomegaly Extremities:  extremities normal, atraumatic, no cyanosis or edema, no edema, redness or tenderness in the calves or thighs and no ulcers, gangrene or trophic changes Pulses: 2+ and symmetric Neurologic: Grossly normal    Adult ECG Report  Rate: 82;  Rhythm: normal sinus rhythm; normal axis, intervals and durations; borderline criteria for left atrial enlargement.  Narrative Interpretation: normal EKG; stable   Other studies Reviewed: Additional studies/ records that were reviewed today include:  Recent Labs:  No new labs - PCP checks in November Lab Results  Component Value Date   CHOL 204 (H) 09/01/2015   HDL 77.70 09/01/2015   LDLCALC 116 (H) 09/01/2015   TRIG 53.0 09/01/2015   CHOLHDL 3 09/01/2015    ASSESSMENT / PLAN: Problem List Items Addressed This Visit    Tobacco abuse (Chronic)    Not really inclined to discuss with his physician today because of her still being in morning from her daughter's death.      Obesity (BMI 35.0-39.9 without comorbidity) (Chronic)    Notable weight loss since March. Partly because she's been trying to adjust her diet, and had been doing exercise prior to her daughter's death. Now gradually getting get back into activity because she has been starting to feel out of shape. Hopefully her fatigue will improve as well.      Mitral valve prolapse - Primary (Chronic)   Relevant Orders   EKG 12-Lead   Intermittent palpitations (Chronic)    Well-controlled on beta blocker. No major issues now.      Hyperlipidemia LDL goal <130 (Chronic)    Her PCP is due to check next month. Her LDL is 116 as of last year, hopefully this will improve. Would try to target closer to 100 versus 130      Relevant Orders   EKG 12-Lead   Essential hypertension (Chronic)    Excellent control on current medications. She is taking chlorthalidone and enalapril plus metoprolol.      Relevant Orders   EKG 12-Lead    Other Visit Diagnoses   None.     Current medicines are  reviewed at length with the patient today. (+/- concerns)-n/a  The following changes have been made: None  Patient Instructions  NO CHANGE WITH CURRENT  MEDICATIONS   Your physician wants you to follow-up in: [place] will receive a reminder letter in the mail two months in advance. If you don't receive a letter, please call our office to schedule the follow-up appointment.  If you need a refill on your cardiac medications before your next appointment, please call your pharmacy.    Studies Ordered:   Orders Placed This Encounter  Procedures  . EKG 12-Lead     Glenetta Hew, M.D., M.S. Interventional Cardiologist   Pager # [phone]

## 2016-07-26 NOTE — Patient Instructions (Signed)
NO CHANGE WITH CURRENT MEDICATIONS   Your physician wants you to follow-up in: [place] will receive a reminder letter in the mail two months in advance. If you don't receive a letter, please call our office to schedule the follow-up appointment.  If you need a refill on your cardiac medications before your next appointment, please call your pharmacy.

## 2016-07-27 ENCOUNTER — Encounter: Payer: Self-pay | Admitting: Gastroenterology

## 2016-07-28 ENCOUNTER — Encounter: Payer: Self-pay | Admitting: Cardiology

## 2016-07-28 NOTE — Assessment & Plan Note (Signed)
Not really inclined to discuss with his physician today because of her still being in morning from her daughter's death.

## 2016-07-28 NOTE — Assessment & Plan Note (Signed)
Her PCP is due to check next month. Her LDL is 116 as of last year, hopefully this will improve. Would try to target closer to 100 versus 130

## 2016-07-28 NOTE — Assessment & Plan Note (Signed)
Excellent control on current medications. She is taking chlorthalidone and enalapril plus metoprolol.

## 2016-07-28 NOTE — Assessment & Plan Note (Signed)
Notable weight loss since March. Partly because she's been trying to adjust her diet, and had been doing exercise prior to her daughter's death. Now gradually getting get back into activity because she has been starting to feel out of shape. Hopefully her fatigue will improve as well.

## 2016-07-28 NOTE — Assessment & Plan Note (Signed)
Well-controlled on beta blocker. No major issues now.

## 2016-08-03 ENCOUNTER — Encounter: Payer: Self-pay | Admitting: Gastroenterology

## 2016-08-08 ENCOUNTER — Telehealth: Payer: Self-pay | Admitting: Family

## 2016-08-08 ENCOUNTER — Other Ambulatory Visit: Payer: Self-pay | Admitting: *Deleted

## 2016-08-08 MED ORDER — AMOXICILLIN 500 MG PO CAPS: ORAL_CAPSULE | ORAL | 0 refills | Status: DC

## 2016-08-08 NOTE — Telephone Encounter (Signed)
Pt called back, dental appt has been r/s to Wednesday. Rx has been sent to pharmacy. Letter printed and forwarded to PCP's red folder for signature and will be faxed to below #.

## 2016-08-08 NOTE — Telephone Encounter (Signed)
Pt called in, she says that she seeing a new dentist this morning at 11:00a ([facility]) she says that before seeing the dentist she always have to be pre med. (amoxicillin) a hr before her visit. Pt says that she mentioned this to her new office and they advised her to reach out to her PCP. Made pt aware that pcp is out of the office today. She says that she's not sure of what to do. Spoke with nurse, they will follow back up with pt to advise after providers assistance.

## 2016-08-08 NOTE — Telephone Encounter (Signed)
Dentist office fax # is [phone]

## 2016-08-08 NOTE — Telephone Encounter (Signed)
Received call from pt stating she has a dental appt today at 11am and usually gets pre-medication prior to dental visits. Pt is fairly new to [place]. Please advise in her absence?

## 2016-08-08 NOTE — Telephone Encounter (Signed)
Spoke with pt and advised her that request will not be done in time to pre-medicate her for today's dental appt and it would be best if she r/s her dental appt. Pt voices understanding and states that her previous dentist called in her pre-med for her but new dentist is not able to do that. Pt states she was prescribed 4 amoxicillin to be taken 1 hour prior to dental appts. Pt has history of mitral valve prolapse. Dentist requests letter stating need for premedication. Please advise?

## 2016-08-09 NOTE — Telephone Encounter (Signed)
Letter faxed to the number below.

## 2016-08-29 ENCOUNTER — Other Ambulatory Visit: Payer: Self-pay | Admitting: Oncology

## 2016-08-29 DIAGNOSIS — Z853 Personal history of malignant neoplasm of breast: Secondary | ICD-10-CM

## 2016-09-06 ENCOUNTER — Encounter: Payer: Self-pay | Admitting: Family

## 2016-09-06 ENCOUNTER — Ambulatory Visit (INDEPENDENT_AMBULATORY_CARE_PROVIDER_SITE_OTHER): Payer: BLUE CROSS/BLUE SHIELD | Admitting: Family

## 2016-09-06 VITALS — BP 138/63 | HR 82 | Temp 98.4°F | Resp 16 | Ht 59.0 in | Wt 213.8 lb

## 2016-09-06 DIAGNOSIS — Z0000 Encounter for general adult medical examination without abnormal findings: Secondary | ICD-10-CM | POA: Diagnosis not present

## 2016-09-06 DIAGNOSIS — Z23 Encounter for immunization: Secondary | ICD-10-CM

## 2016-09-06 LAB — BASIC METABOLIC PANEL
BUN: 13 mg/dL (ref 6–23)
CHLORIDE: 104 meq/L (ref 96–112)
CO2: 28 mEq/L (ref 19–32)
Calcium: 9.4 mg/dL (ref 8.4–10.5)
Creatinine, Ser: 0.85 mg/dL (ref 0.40–1.20)
GFR: 88.35 mL/min (ref >60.00)
Glucose, Bld: 80 mg/dL (ref 70–99)
POTASSIUM: 4 meq/L (ref 3.5–5.1)
SODIUM: 141 meq/L (ref 135–145)

## 2016-09-06 LAB — URINALYSIS, ROUTINE W REFLEX MICROSCOPIC
Bilirubin Urine: NEGATIVE
Ketones, ur: NEGATIVE
Leukocytes, UA: NEGATIVE
NITRITE: NEGATIVE
PH: 6 (ref 5.0–8.0)
SPECIFIC GRAVITY, URINE: 1.02 (ref 1.000–1.030)
TOTAL PROTEIN, URINE-UPE24: NEGATIVE
URINE GLUCOSE: NEGATIVE
Urobilinogen, UA: 0.2 (ref 0.0–1.0)

## 2016-09-06 LAB — LIPID PANEL
CHOLESTEROL: 223 mg/dL — AB (ref 0–200)
HDL: 78.8 mg/dL (ref >39.00)
LDL CALC: 129 mg/dL — AB (ref 0–99)
NONHDL: 144.17
Total CHOL/HDL Ratio: 3
Triglycerides: 78 mg/dL (ref 0.0–149.0)
VLDL: 15.6 mg/dL (ref 0.0–40.0)

## 2016-09-06 LAB — HEPATIC FUNCTION PANEL
ALK PHOS: 74 U/L (ref 39–117)
ALT: 12 U/L (ref 0–35)
AST: 11 U/L (ref 0–37)
Albumin: 3.9 g/dL (ref 3.5–5.2)
BILIRUBIN TOTAL: 0.4 mg/dL (ref 0.2–1.2)
Bilirubin, Direct: 0.1 mg/dL (ref 0.0–0.3)
Total Protein: 7.4 g/dL (ref 6.0–8.3)

## 2016-09-06 LAB — TSH: TSH: 0.97 u[IU]/mL (ref 0.35–4.50)

## 2016-09-06 NOTE — Patient Instructions (Signed)
Try to add 30 minutes of walking 5 days a week. Complete lab work prior to leaving.

## 2016-09-06 NOTE — Progress Notes (Signed)
Pre visit review using our clinic review tool, if applicable. No additional management support is needed unless otherwise documented below in the visit note. 

## 2016-09-06 NOTE — Progress Notes (Signed)
Subjective:    Patient ID: Katie Jacobson, female    DOB: 1956/07/18, 60 y.o.   MRN: [id number]  HPI  Katie Jacobson is a 60 yr old female who presents today for cpx.  Patient presents today for complete physical.  Immunizations:  Pneumovax and tetanus up to date.   Diet: diet is healthy Exercise: no formal exercise Colonoscopy: 2012, due and scheduled for 12/22 Dexa: 09/08/15 Pap Smear:  10/16/14 Mammogram:10/29/15 Dental:  Up to date Vision:  Up to date  Since her last visit she unfortunately lost her daughter in a motor vehicle accident. She continues to grieve the loss of her daughter.  Finding it hard to remain strong for her other family members. She was able to celebrate the marriage of her oldest daughter.  She continues to follow with oncology due to her hx of breast cancer, s/p lumpectomy and chemo.    Review of Systems  Constitutional: Negative for unexpected weight change.  Eyes: Negative for visual disturbance.  Respiratory: Negative for shortness of breath.        Recent URI- symptoms resolving  Cardiovascular: Negative for chest pain.  Gastrointestinal: Negative for constipation and diarrhea.  Genitourinary: Negative for dysuria.       Some frequency from diurectic  Musculoskeletal:       Occasional arthralgia  Neurological: Negative for headaches.  Hematological: Negative for adenopathy.  Psychiatric/Behavioral:       Stress regarding daughter's death       Past Medical History:  Diagnosis Date  . Breast cancer of upper-outer quadrant of right female breast ([place]) 11/14/2014  . Breast cancer, right breast ([place])   . History of chemotherapy    finished chemo 02/03/2015  . History of kidney stones   . History of seizures    as a child - unknown cause - states was never on anticonvulsants  . Hypertension    states under control with meds., has been on med. x "years"  . Mitral valve prolapse 1990  . Osteoporosis   . Seasonal allergies      Social History     Social History  . Marital status: Married    Spouse name: N/A  . Number of children: 2  . Years of education: N/A   Occupational History  . Not on file.   Social History Main Topics  . Smoking status: Current Every Day Smoker    Packs/day: 0.50    Years: 43.00    Types: Cigarettes  . Smokeless tobacco: Never Used     Comment: 10 cig./day  . Alcohol use 0.6 - 2.4 oz/week    1 - 4 Standard drinks or equivalent per week  . Drug use: No  . Sexual activity: Not on file   Other Topics Concern  . Not on file   Social History Narrative   Married with 2 daughters- born 35 and 61   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  Has been doing part time work with [employer] -- had 6 active clients, but the most important one is her Daughter.   Regular exercise:  No   Caffeine Use: none; Current 1ppd smoker x > 39 yrs; EtOH ~4 oz/week    Past Surgical History:  Procedure Laterality Date  . ABDOMINAL HYSTERECTOMY  1998   partial  . APPENDECTOMY  1976  . BREAST LUMPECTOMY Right 02/2015  . COLONOSCOPY    . KNEE ARTHROSCOPY Left 03/26/2008  .  PLANTAR'S WART EXCISION Left    x 2  . PORT-A-CATH REMOVAL Left 04/06/2015   Procedure: REMOVAL PORT-A-CATH;  Surgeon: Rolm Bookbinder, MD;  Location: [place];  Service: General;  Laterality: Left;  . PORTACATH PLACEMENT N/A 11/27/2014   Procedure: INSERTION PORT-A-CATH;  Surgeon: Rolm Bookbinder, MD;  Location: [place];  Service: General;  Laterality: N/A;  . RADIOACTIVE SEED GUIDED MASTECTOMY WITH AXILLARY SENTINEL LYMPH NODE BIOPSY Right 03/02/2015   Procedure: RADIOACTIVE SEED GUIDED RIGHT PARTIAL MASTECTOMY WITH RIGHT AXILLARY SENTINEL LYMPH NODE BIOPSY;  Surgeon: Rolm Bookbinder, MD;  Location: [place];  Service: General;  Laterality: Right;  . SALIVARY STONE REMOVAL Right 1972  . TONSILLECTOMY  1970s  . TUBAL LIGATION  1996     Family History  Problem Relation Age of Onset  . Heart disease Mother   . Stroke Mother   . Hypertension Mother   . Diabetes Mother   . Diabetes Sister   . Kidney disease Brother   . Mental retardation Brother   . Diabetes Sister   . Heart attack Father   . Breast cancer Cousin     deceased 54    Allergies  Allergen Reactions  . Sulfa Antibiotics Other (See Comments)    UNKNOWN    Current Outpatient Prescriptions on File Prior to Visit  Medication Sig Dispense Refill  . albuterol (PROVENTIL HFA;VENTOLIN HFA) 108 (90 BASE) MCG/ACT inhaler Inhale into the lungs every 6 (six) hours as needed for wheezing or shortness of breath.    Marland Kitchen amoxicillin (AMOXIL) 500 MG capsule Take 2 grams (4 pills) prior to dental procedure 12 capsule 0  . aspirin 81 MG tablet Take 1 tablet (81 mg total) by mouth daily. 30 tablet 0  . chlorthalidone (HYGROTON) 25 MG tablet Take 1 tablet (25 mg total) by mouth every other day. 90 tablet 3  . enalapril (VASOTEC) 20 MG tablet TAKE 1 TABLET BY MOUTH EVERY NIGHT AT BEDTIME 30 tablet 6  . fluticasone (FLONASE) 50 MCG/ACT nasal spray Place 1 spray into both nostrils as needed.    . metoprolol tartrate (LOPRESSOR) 25 MG tablet Take 1 tablet (25 mg total) by mouth 2 (two) times daily. 180 tablet 3   No current facility-administered medications on file prior to visit.     BP 138/63 (BP Location: Left Arm, Cuff Size: Large)   Pulse 82   Temp 98.4 F (36.9 C) (Oral)   Resp 16   Ht 4\' 11"  (1.499 m)   Wt 213 lb 12.8 oz (97 kg)   LMP 10/10/1996   SpO2 100%   BMI 43.18 kg/m    Objective:   Physical Exam Physical Exam  Constitutional: She is oriented to person, place, and time. She appears well-developed and well-nourished. No distress.  HENT:  Head: Normocephalic and atraumatic.  Right Ear: Tympanic membrane and ear canal normal.  Left Ear: Tympanic membrane and ear canal normal.  Mouth/Throat: Oropharynx is clear and moist.  Eyes: Pupils are  equal, round, and reactive to light. No scleral icterus.  Neck: Normal range of motion. No thyromegaly present.  Cardiovascular: Normal rate and regular rhythm.   No murmur heard. Pulmonary/Chest: Effort normal and breath sounds normal. No respiratory distress. He has no wheezes. She has no rales. She exhibits no tenderness.  Abdominal: Soft. Bowel sounds are normal. She exhibits no distension and no mass. There is no tenderness. There is no rebound and no guarding.  Musculoskeletal: She exhibits no edema.  Lymphadenopathy:    She has no cervical adenopathy.  Neurological: She is alert and oriented to person, place, and time. She has normal patellar reflexes. She exhibits normal muscle tone. Coordination normal.  Skin: Skin is warm and dry.  Psychiatric: She has a normal mood and affect. Her behavior is normal. Judgment and thought content normal.  Breast/pelvic: deferred       Assessment & Plan:          Assessment & Plan:  Preventative care- Flu shot today.  Tdap and pneumovax up to date. Discussed diet, exercise and weight loss.  Obtain routine lab work.   Grief reaction- spoke with pt at length today about loss of her daughter.  We discussed counseling but she is not ready to do this at this time.  Advised pt to let me know if her grief symptoms worsen or if they do not continue to improve.

## 2016-09-07 LAB — CBC WITH DIFFERENTIAL/PLATELET
BASOS PCT: 0.3 % (ref 0.0–3.0)
Basophils Absolute: 0 10*3/uL (ref 0.0–0.1)
EOS PCT: 1.1 % (ref 0.0–5.0)
Eosinophils Absolute: 0.1 10*3/uL (ref 0.0–0.7)
HEMATOCRIT: 41.6 % (ref 36.0–46.0)
HEMOGLOBIN: 14.3 g/dL (ref 12.0–15.0)
LYMPHS PCT: 29.5 % (ref 12.0–46.0)
Lymphs Abs: 1.7 10*3/uL (ref 0.7–4.0)
MCHC: 34.5 g/dL (ref 30.0–36.0)
MCV: 95.7 fl (ref 78.0–100.0)
MONOS PCT: 5.6 % (ref 3.0–12.0)
Monocytes Absolute: 0.3 10*3/uL (ref 0.1–1.0)
Neutro Abs: 3.6 10*3/uL (ref 1.4–7.7)
Neutrophils Relative %: 63.5 % (ref 43.0–77.0)
Platelets: 210 10*3/uL (ref 150.0–400.0)
RBC: 4.34 Mil/uL (ref 3.87–5.11)
RDW: 16.6 % — AB (ref 11.5–15.5)
WBC: 5.7 10*3/uL (ref 4.0–10.5)

## 2016-09-11 ENCOUNTER — Encounter: Payer: Self-pay | Admitting: Family

## 2016-09-14 ENCOUNTER — Telehealth: Payer: Self-pay | Admitting: Family

## 2016-09-14 NOTE — Telephone Encounter (Signed)
Spoke with Katie Jacobson at the GI office. States they noticed that pt's record indicates she is participating in a study and they wanted to make sure she was not taking any type of drug through the study that was an anticoagulant. States pt told her she want taking any study drug and became upset. I also spoke with pt and she reiterated that she is not taking any resear medication and was not aware that she was ever enrolled in a study. I do not know how to access the research study information since notification did not mention what dept the study was with. Do you know who we need to contact?

## 2016-09-14 NOTE — Telephone Encounter (Signed)
Patient called to confirm her pre-visit for her colonoscopy and the lady at the office, Lelan Pons, stated that her file was restricted and she was unable to get into her chart. She said Lelan Pons told her to call her PCP. The patient would also like a call once this is figured out. Please advise   Lelan Pons phone: [phone]  Patient phone: [phone]

## 2016-09-16 NOTE — Telephone Encounter (Addendum)
Ticket has been logged with IT to get research designation removed from chart. Notified pt, already spoke with GI.

## 2016-09-16 NOTE — Telephone Encounter (Signed)
I do not see any record of her participating in a study and a do not see that she is on an anticoagulant.  Please notify pt and GI. Thanks.

## 2016-09-20 ENCOUNTER — Encounter: Payer: Self-pay | Admitting: Gastroenterology

## 2016-09-20 ENCOUNTER — Ambulatory Visit (AMBULATORY_SURGERY_CENTER): Payer: Self-pay

## 2016-09-20 VITALS — Ht 59.0 in | Wt 215.6 lb

## 2016-09-20 DIAGNOSIS — Z860100 Personal history of colon polyps, unspecified: Secondary | ICD-10-CM

## 2016-09-20 MED ORDER — NA SULFATE-K SULFATE-MG SULF 17.5-3.13-1.6 GM/177ML PO SOLN: ORAL | 0 refills | Status: DC

## 2016-09-20 NOTE — Progress Notes (Signed)
Per pt, no allergies to soy or egg products.Pt not taking any weight loss meds or using  O2 at home. 

## 2016-09-22 ENCOUNTER — Other Ambulatory Visit: Payer: Self-pay | Admitting: Cardiology

## 2016-09-22 ENCOUNTER — Telehealth: Payer: Self-pay | Admitting: Gastroenterology

## 2016-09-22 NOTE — Telephone Encounter (Signed)
PT HAS A PNM THEN 50 $ COUPON BUT PHARMACY TOLD HER IT WOULD NOT WORK- INFORMED PT TO HAVE PHARMACY RUN AS CASH AND SHOULD BE 50 OR LESS  -- IF SHE CONTINUES TO HAVE ISSUES, CALL ME BACK AND WE CAN CHANGE TO MIRALAX OR SEE ABOUT SAMPLE- PT DOES NOT WANT THE TRI LYTE OR GOLYTELY AS SHE DOES NOT WANT THAT VOLUME  OF 128 OUNCES TO DRINK INSTRUCTED TO CALL BACK WITH FURTHER ISSUES  Katie Jacobson PV

## 2016-09-23 ENCOUNTER — Telehealth: Payer: Self-pay | Admitting: Gastroenterology

## 2016-09-23 NOTE — Telephone Encounter (Signed)
Pt states she got her prep and got it at 50$ with the pay no more coupon.  Pt states if we had of done a prior authorization she could have gotten it at 100% covered.  Pt is upset that she had to pay and was told after she paid 50$ we could have done the prior auth.  Informed her we do not do prior auth in pre visit . Apologized for this inconveniece for this . Let me know if there is anything I can do to help her. She is going to call [place] again and let me know   Lelan Pons PV

## 2016-09-30 ENCOUNTER — Encounter: Payer: Self-pay | Admitting: Gastroenterology

## 2016-09-30 ENCOUNTER — Ambulatory Visit (AMBULATORY_SURGERY_CENTER): Payer: BLUE CROSS/BLUE SHIELD | Admitting: Gastroenterology

## 2016-09-30 VITALS — BP 120/58 | HR 66 | Temp 96.9°F | Resp 14 | Ht 59.0 in | Wt 215.0 lb

## 2016-09-30 DIAGNOSIS — Z86010 Personal history of colonic polyps: Secondary | ICD-10-CM

## 2016-09-30 DIAGNOSIS — D125 Benign neoplasm of sigmoid colon: Secondary | ICD-10-CM

## 2016-09-30 DIAGNOSIS — D123 Benign neoplasm of transverse colon: Secondary | ICD-10-CM | POA: Diagnosis not present

## 2016-09-30 DIAGNOSIS — D122 Benign neoplasm of ascending colon: Secondary | ICD-10-CM

## 2016-09-30 DIAGNOSIS — D127 Benign neoplasm of rectosigmoid junction: Secondary | ICD-10-CM

## 2016-09-30 HISTORY — PX: COLONOSCOPY: SHX174

## 2016-09-30 MED ORDER — SODIUM CHLORIDE 0.9 % IV SOLN: 500.0000 mL | INTRAVENOUS | Status: DC

## 2016-09-30 NOTE — Op Note (Signed)
[facility] Patient Name: Katie Jacobson Procedure Date: 09/30/2016 7:57 AM MRN: [id number] Endoscopist: Viviann Spare P. Alexcia Schools MD, MD Age: 60 Referring MD:  Date of Birth: [birth year] Gender: Female Account #: [id number] Procedure:                Colonoscopy Indications:              Surveillance: Personal history of adenomatous                            polyps on last colonoscopy 5 years ago Medicines:                Monitored Anesthesia Care Procedure:                Pre-Anesthesia Assessment:                           - Prior to the procedure, a History and Physical                            was performed, and patient medications and                            allergies were reviewed. The patient's tolerance of                            previous anesthesia was also reviewed. The risks                            and benefits of the procedure and the sedation                            options and risks were discussed with the patient.                            All questions were answered, and informed consent                            was obtained. Prior Anticoagulants: The patient has                            taken aspirin, last dose was 1 day prior to                            procedure. ASA Grade Assessment: II - A patient                            with mild systemic disease. After reviewing the                            risks and benefits, the patient was deemed in                            satisfactory condition to undergo the procedure.  After obtaining informed consent, the colonoscope                            was passed under direct vision. Throughout the                            procedure, the patient's blood pressure, pulse, and                            oxygen saturations were monitored continuously. The                            Model PCF-H190DL [id number]) scope was introduced                            through the  anus and advanced to the the cecum,                            identified by appendiceal orifice and ileocecal                            valve. The colonoscopy was performed without                            difficulty. The patient tolerated the procedure                            well. The quality of the bowel preparation was                            good. The ileocecal valve, appendiceal orifice, and                            rectum were photographed. Scope In: 8:03:46 AM Scope Out: 8:17:36 AM Scope Withdrawal Time: 0 hours 12 minutes 4 seconds  Total Procedure Duration: 0 hours 13 minutes 50 seconds  Findings:                 The perianal and digital rectal examinations were                            normal.                           A 4 mm polyp was found in the ascending colon. The                            polyp was sessile. The polyp was removed with a                            cold snare. Resection and retrieval were complete.                           A 4 mm polyp was found in the transverse colon. The  polyp was sessile. The polyp was removed with a                            cold snare. Resection and retrieval were complete.                           Two sessile polyps were found in the sigmoid colon.                            The polyps were 3 to 5 mm in size. These polyps                            were removed with a cold snare. Resection and                            retrieval were complete.                           Multiple medium-mouthed diverticula were found in                            the entire colon.                           The exam was otherwise without abnormality on                            direct and retroflexion views. Complications:            No immediate complications. Estimated blood loss:                            Minimal. Estimated Blood Loss:     Estimated blood loss was minimal. Impression:               -  One 4 mm polyp in the ascending colon, removed                            with a cold snare. Resected and retrieved.                           - One 4 mm polyp in the transverse colon, removed                            with a cold snare. Resected and retrieved.                           - Two 3 to 5 mm polyps in the sigmoid colon,                            removed with a cold snare. Resected and retrieved.                           - Diverticulosis in the entire examined colon.                           -  The examination was otherwise normal on direct                            and retroflexion views. Recommendation:           - Patient has a contact number available for                            emergencies. The signs and symptoms of potential                            delayed complications were discussed with the                            patient. Return to normal activities tomorrow.                            Written discharge instructions were provided to the                            patient.                           - Resume previous diet.                           - Continue present medications.                           - No ibuprofen, naproxen, or other non-steroidal                            anti-inflammatory drugs for 2 weeks after polyp                            removal.                           - Await pathology results.                           - Repeat colonoscopy is recommended for                            surveillance. The colonoscopy date will be                            determined after pathology results from today's                            exam become available for review. Viviann Spare P. Bernon Arviso MD, MD 09/30/2016 8:24:15 AM This report has been signed electronically.

## 2016-09-30 NOTE — Progress Notes (Signed)
A/ox3 pleased with MAC, report to Michelle RN 

## 2016-09-30 NOTE — Patient Instructions (Signed)
YOU HAD AN ENDOSCOPIC PROCEDURE TODAY AT [facility]:   Refer to the procedure report that was given to you for any specific questions about what was found during the examination.  If the procedure report does not answer your questions, please call your gastroenterologist to clarify.  If you requested that your care partner not be given the details of your procedure findings, then the procedure report has been included in a sealed envelope for you to review at your convenience later.  YOU SHOULD EXPECT: Some feelings of bloating in the abdomen. Passage of more gas than usual.  Walking can help get rid of the air that was put into your GI tract during the procedure and reduce the bloating. If you had a lower endoscopy (such as a colonoscopy or flexible sigmoidoscopy) you may notice spotting of blood in your stool or on the toilet paper. If you underwent a bowel prep for your procedure, you may not have a normal bowel movement for a few days.  Please Note:  You might notice some irritation and congestion in your nose or some drainage.  This is from the oxygen used during your procedure.  There is no need for concern and it should clear up in a day or so.  SYMPTOMS TO REPORT IMMEDIATELY:   Following lower endoscopy (colonoscopy or flexible sigmoidoscopy):  Excessive amounts of blood in the stool  Significant tenderness or worsening of abdominal pains  Swelling of the abdomen that is new, acute  Fever of 100F or higher  For urgent or emergent issues, a gastroenterologist can be reached at any hour by calling [phone].   DIET:  We do recommend a small meal at first, but then you may proceed to your regular diet.  Drink plenty of fluids but you should avoid alcoholic beverages for 24 hours.  ACTIVITY:  You should plan to take it easy for the rest of today and you should NOT DRIVE or use heavy machinery until tomorrow (because of the sedation medicines used during the test).     FOLLOW UP: Our staff will call the number listed on your records the next business day following your procedure to check on you and address any questions or concerns that you may have regarding the information given to you following your procedure. If we do not reach you, we will leave a message.  However, if you are feeling well and you are not experiencing any problems, there is no need to return our call.  We will assume that you have returned to your regular daily activities without incident.  If any biopsies were taken you will be contacted by phone or by letter within the next 1-3 weeks.  Please call us at [phone] if you have not heard about the biopsies in 3 weeks.    SIGNATURES/CONFIDENTIALITY: You and/or your care partner have signed paperwork which will be entered into your electronic medical record.  These signatures attest to the fact that that the information above on your After Visit Summary has been reviewed and is understood.  Full responsibility of the confidentiality of this discharge information lies with you and/or your care-partner.  Polyps  (handout given) Diverticulosis (handout given) Await biopsy results to determined next Colonoscopy

## 2016-09-30 NOTE — Progress Notes (Signed)
Called to room to assist during endoscopic procedure.  Patient ID and intended procedure confirmed with present staff. Received instructions for my participation in the procedure from the performing physician.  

## 2016-10-04 ENCOUNTER — Telehealth: Payer: Self-pay | Admitting: *Deleted

## 2016-10-04 NOTE — Telephone Encounter (Signed)
No answer. Number identifier. Message left to call if questions or concerns. 

## 2016-10-11 IMAGING — MR MR BREAST BILATERAL W WO CONTRAST
6 of 13 series · 18 of 48 positions shown · IV contrast (20)
Comparison: Breast MRI 11/21/2014 and bilateral mammogram
10/16/2014

CLINICAL DATA: Known malignancy in the superior right breast. MRI
is performed to assess response to neoadjuvant therapy.

EXAM:
BILATERAL BREAST MRI WITH AND WITHOUT CONTRAST
TECHNIQUE: Multiplanar, multisequence MR images of both breasts were obtained
prior to and following the intravenous administration of 16 ml of
MultiHance.

[Series 3: T2 · axial · 3.0mm · 0.66mm/px · 1 of 76 slices shown]
[im 1/76]
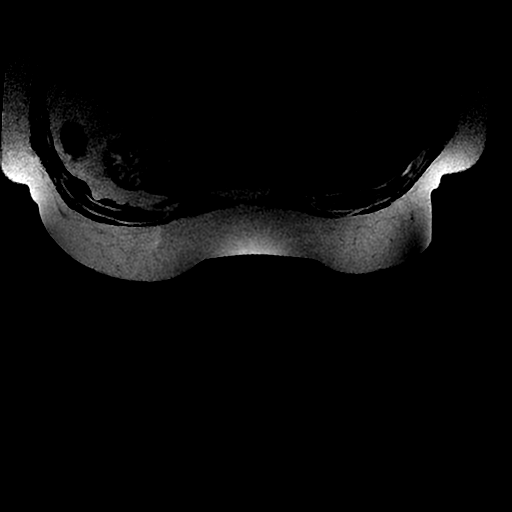

[Series 5: ax ir · axial · 3.0mm · 0.66mm/px · 1 of 76 slices shown]
[im 1/76]
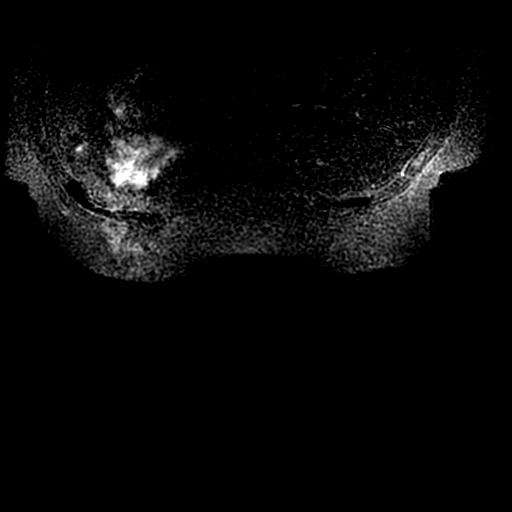

[Series 700: (id) vibrant mph · axial · 2.0mm · 0.70mm/px · z∈[-111,+124]mm · 5 of 236 slices shown]
[im 1/236]
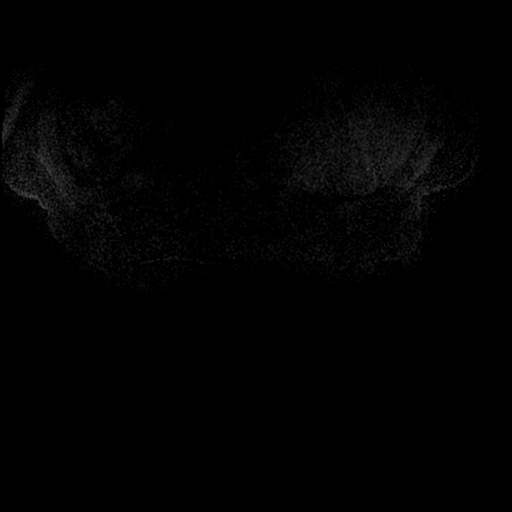
[im 59/236]
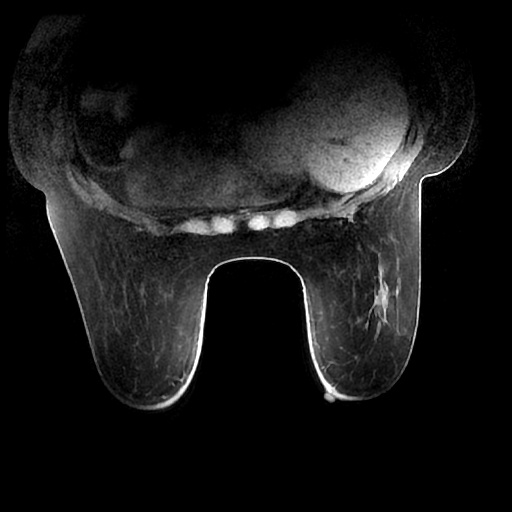
[im 118/236]
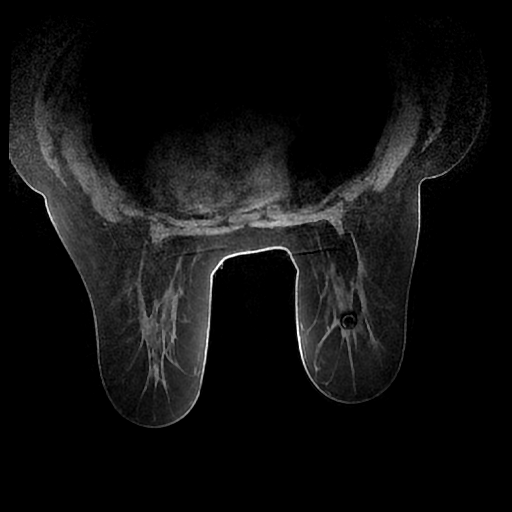
[im 177/236]
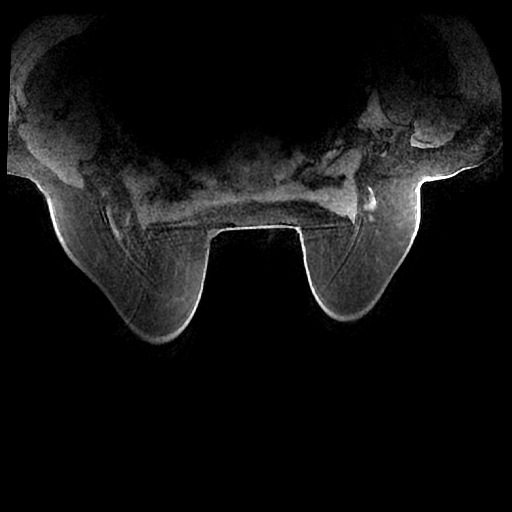
[im 236/236]
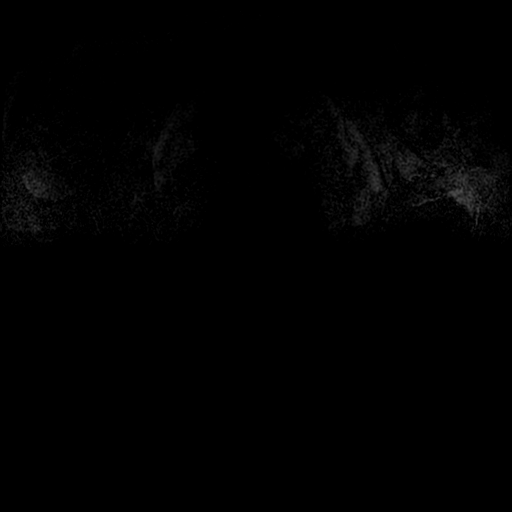

[Series 701: ph1/(id) vibrant mph · axial · 2.0mm · 0.70mm/px · z∈[-111,+124]mm · 5 of 236 slices shown]
[im 1/236]
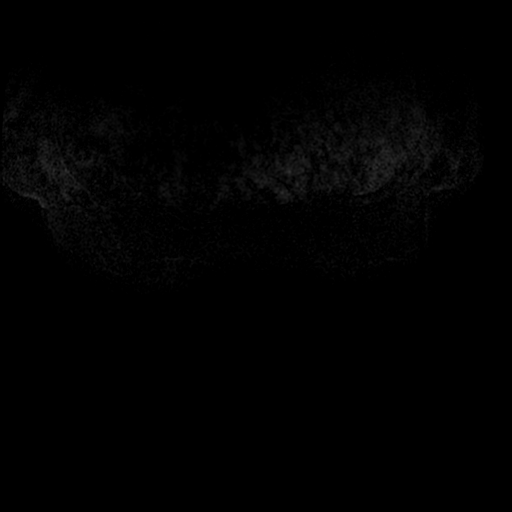
[im 59/236]
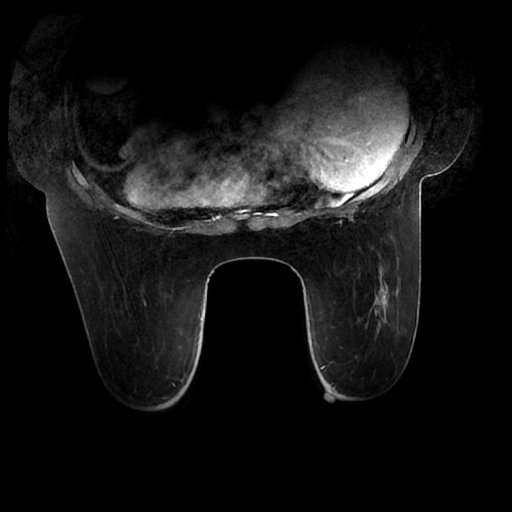
[im 118/236]
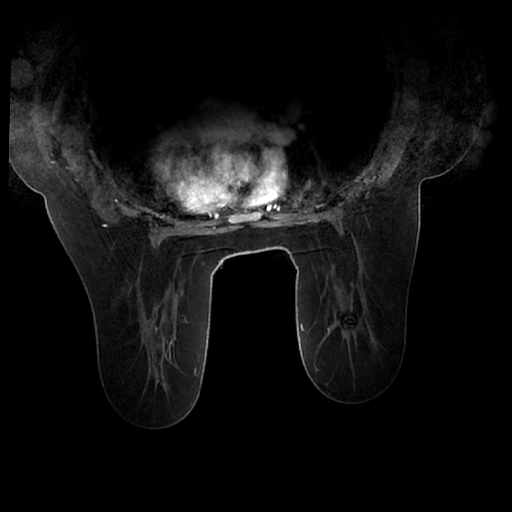
[im 177/236]
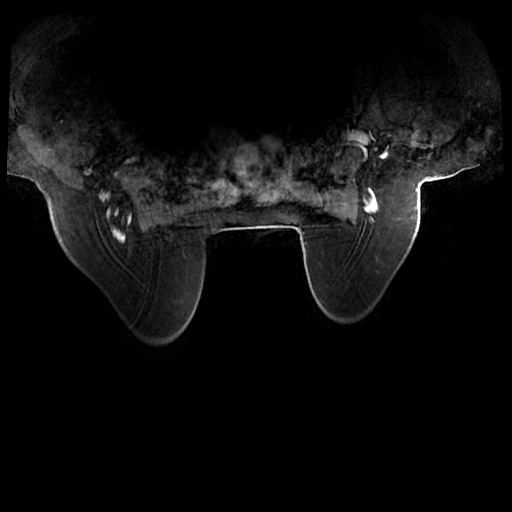
[im 236/236]
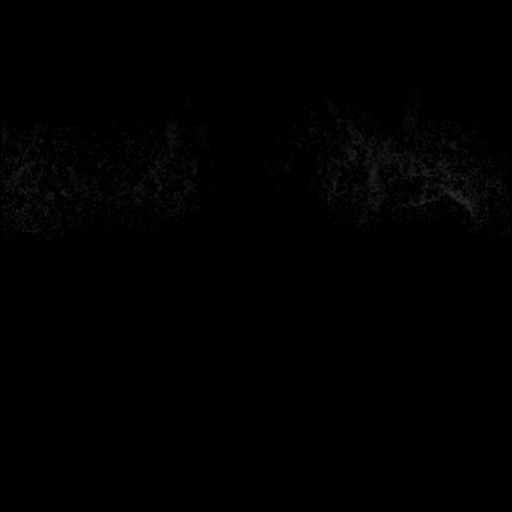

[Series 702: ph2/(id) vibrant mph · axial · 2.0mm · 0.70mm/px · z∈[-111,+124]mm · 5 of 236 slices shown]
[im 1/236]
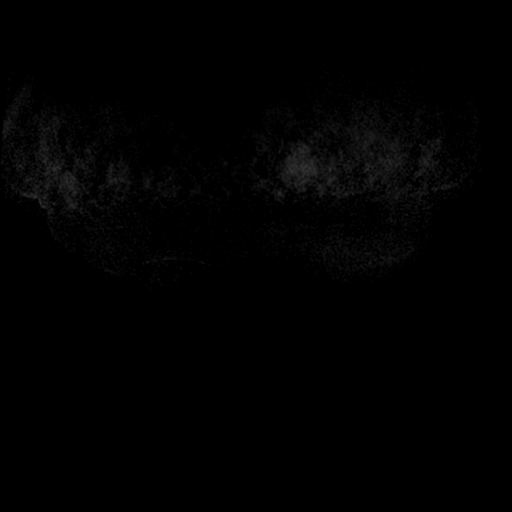
[im 59/236]
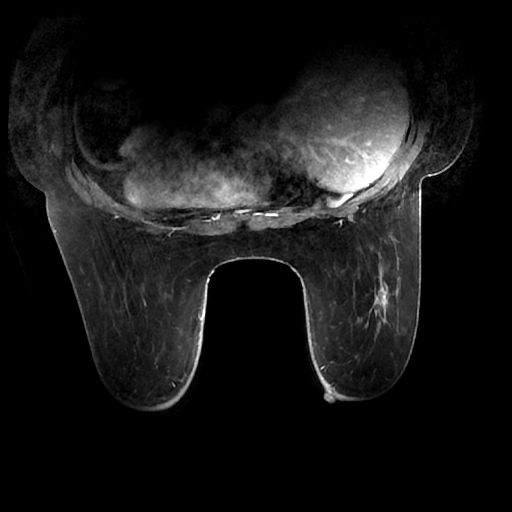
[im 118/236]
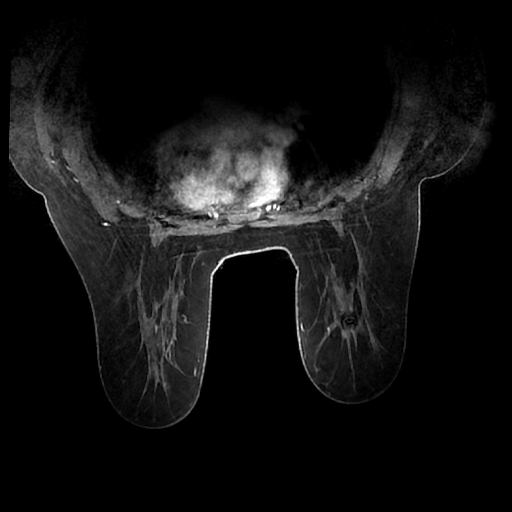
[im 177/236]
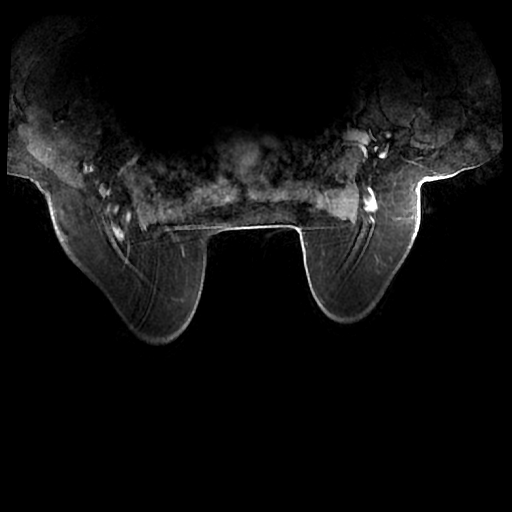
[im 236/236]
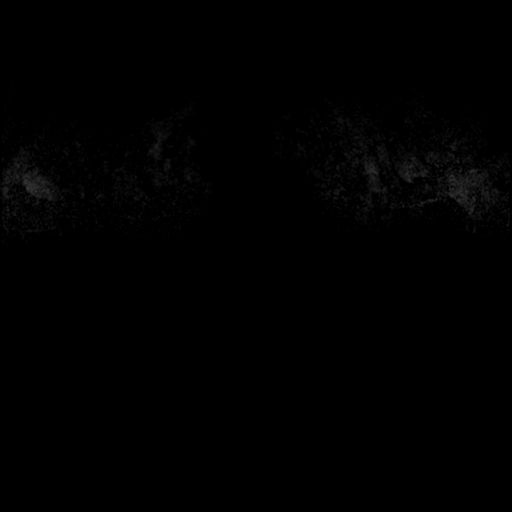

[Series 703: ph3/(id) vibrant mph · axial · 2.0mm · 0.70mm/px · 1 of 236 slices shown]
[im 1/236]
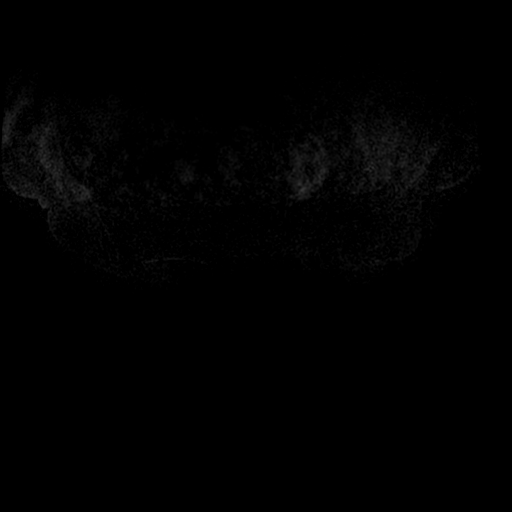

[18 of 48 positions shown; findings below may reference images not displayed]

THREE-DIMENSIONAL MR IMAGE RENDERING ON INDEPENDENT WORKSTATION:

Three-dimensional MR images were rendered by post-processing of the
original MR data on an independent workstation. The
three-dimensional MR images were interpreted, and findings are
reported in the following complete MRI report for this study. Three
dimensional images were evaluated at the independent [employer]
workstation
FINDINGS: Breast composition: b. Scattered fibroglandular tissue.

Background parenchymal enhancement: Minimal

Right breast: The biopsy proven mass in the 11 o'clock position of
the right breast, middle third, has decreased in size and intensity
of enhancement since the prior breast MRI of 11/21/2014. Currently,
the enhancing mass measures 1.0 x 0.7 x 0.5 cm (on the prior breast
MRI, the mass measured 1.4 x 1.4 x 1.0 cm on the exam from
11/21/2014). Biopsy clip artifact is seen within the malignancy. No
additional suspicious areas of enhancement in the right breast.
Previously described mammographically stable oval mass medial and
posterior to the biopsy-proven cancer is most likely a fibroadenoma
and has benign appearances.

Left breast: No mass or abnormal enhancement.

Lymph nodes: No abnormal appearing lymph nodes.

Ancillary findings:  None.
IMPRESSION: Positive response to neoadjuvant therapy. The biopsy proven
malignancy 11 o'clock position of the right breast is decreased in
size and intensity of enhancement.

RECOMMENDATION:
Treatment planning for known right breast cancer.

BI-RADS CATEGORY  6: Known biopsy-proven malignancy.

## 2016-10-12 ENCOUNTER — Encounter: Payer: Self-pay | Admitting: Gastroenterology

## 2016-10-24 LAB — HM PAP SMEAR: HM PAP: NORMAL

## 2016-10-28 ENCOUNTER — Other Ambulatory Visit: Payer: Self-pay | Admitting: Nurse Practitioner

## 2016-10-31 ENCOUNTER — Ambulatory Visit
Admission: RE | Admit: 2016-10-31 | Discharge: 2016-10-31 | Disposition: A | Discharge status: Home / Self Care | Payer: BLUE CROSS/BLUE SHIELD | Source: Ambulatory Visit | Attending: Oncology | Admitting: Oncology

## 2016-10-31 DIAGNOSIS — Z853 Personal history of malignant neoplasm of breast: Secondary | ICD-10-CM

## 2016-11-08 ENCOUNTER — Encounter: Payer: Self-pay | Admitting: Family

## 2016-12-27 ENCOUNTER — Telehealth: Payer: Self-pay | Admitting: Emergency Medicine

## 2016-12-27 NOTE — Telephone Encounter (Signed)
Patient called with concerns about the weather for 3/21; patient has appointment at 1:30 on 3/21; patient plans to come as scheduled but will call if weather conditions worsen and she is unable to make it.

## 2016-12-28 ENCOUNTER — Other Ambulatory Visit (HOSPITAL_BASED_OUTPATIENT_CLINIC_OR_DEPARTMENT_OTHER): Payer: BLUE CROSS/BLUE SHIELD

## 2016-12-28 ENCOUNTER — Ambulatory Visit (HOSPITAL_BASED_OUTPATIENT_CLINIC_OR_DEPARTMENT_OTHER): Payer: BLUE CROSS/BLUE SHIELD | Admitting: Oncology

## 2016-12-28 VITALS — BP 141/72 | HR 72 | Temp 97.9°F | Resp 18 | Ht 59.0 in | Wt 220.2 lb

## 2016-12-28 DIAGNOSIS — Z853 Personal history of malignant neoplasm of breast: Secondary | ICD-10-CM | POA: Diagnosis not present

## 2016-12-28 DIAGNOSIS — C50411 Malignant neoplasm of upper-outer quadrant of right female breast: Secondary | ICD-10-CM

## 2016-12-28 LAB — COMPREHENSIVE METABOLIC PANEL
ALBUMIN: 3.7 g/dL (ref 3.5–5.0)
ALK PHOS: 86 U/L (ref 40–150)
ALT: 17 U/L (ref 0–55)
AST: 14 U/L (ref 5–34)
Anion Gap: 12 mEq/L — ABNORMAL HIGH (ref 3–11)
BUN: 15.6 mg/dL (ref 7.0–26.0)
CO2: 25 meq/L (ref 22–29)
Calcium: 9.7 mg/dL (ref 8.4–10.4)
Chloride: 102 mEq/L (ref 98–109)
Creatinine: 0.9 mg/dL (ref 0.6–1.1)
EGFR: 78 mL/min/{1.73_m2} — AB (ref >90)
GLUCOSE: 85 mg/dL (ref 70–140)
POTASSIUM: 3.6 meq/L (ref 3.5–5.1)
Sodium: 139 mEq/L (ref 136–145)
Total Bilirubin: 0.35 mg/dL (ref 0.20–1.20)
Total Protein: 7.5 g/dL (ref 6.4–8.3)

## 2016-12-28 LAB — CBC WITH DIFFERENTIAL/PLATELET
BASO%: 0.2 % (ref 0.0–2.0)
Basophils Absolute: 0 10*3/uL (ref 0.0–0.1)
EOS%: 1.4 % (ref 0.0–7.0)
Eosinophils Absolute: 0.1 10*3/uL (ref 0.0–0.5)
HCT: 42.7 % (ref 34.8–46.6)
HGB: 14.4 g/dL (ref 11.6–15.9)
LYMPH#: 2.1 10*3/uL (ref 0.9–3.3)
LYMPH%: 37.1 % (ref 14.0–49.7)
MCH: 31.6 pg (ref 25.1–34.0)
MCHC: 33.7 g/dL (ref 31.5–36.0)
MCV: 93.6 fL (ref 79.5–101.0)
MONO#: 0.4 10*3/uL (ref 0.1–0.9)
MONO%: 6.8 % (ref 0.0–14.0)
NEUT#: 3.1 10*3/uL (ref 1.5–6.5)
NEUT%: 54.5 % (ref 38.4–76.8)
Platelets: 213 10*3/uL (ref 145–400)
RBC: 4.56 10*6/uL (ref 3.70–5.45)
RDW: 15.8 % — ABNORMAL HIGH (ref 11.2–14.5)
WBC: 5.7 10*3/uL (ref 3.9–10.3)

## 2016-12-28 NOTE — Progress Notes (Signed)
Katie Jacobson  [phone] [phone]     ID: THEO REITHER DOB: [birth year]  MR#: [id number]  ZSW#:[id number]  Patient Care Team: Debbrah Alar, NP as PCP - General (Internal Medicine) Gus Height, MD (Obstetrics and Gynecology) Rolm Bookbinder, MD as Consulting Physician (General Surgery) Chauncey Cruel, MD as Consulting Physician (Oncology) Eppie Gibson, MD as Attending Physician (Radiation Oncology) Rockwell Germany, RN as Registered Nurse Mauro Kaufmann, RN as Registered Nurse Holley Bouche, NP as Nurse Practitioner (Nurse Practitioner) Sylvan Cheese, NP as Nurse Practitioner (Hematology and Oncology) OTHER MD:  CHIEF COMPLAINT: Triple negative breast cancer  CURRENT TREATMENT: observation  BREAST CANCER HISTORY: From the original intake note:  The patient herself palpated a mass in her right breast there is she brought it to her gynecologist, Dr. Alan Ripper attention and he obtained screening mammography which suggested an abnormality in the right breast. On 10/24/2014 the patient underwent right diagnostic mammography and ultrasonography at the breast Center. The breast density was category B. There was an ill-defined mass in the upper outer quadrant of the right breast which was palpable on exam. By ultrasound this was irregular and hypoechoic measuring 1.2 cm. Evaluation of the right axilla was negative.  Biopsy of the mass in question showed an invasive ductal carcinoma, grade 2 or 3, triple negative, with an MIB-1 of 19%.  Katie Jacobson case was presented at the multidisciplinary breast cancer conference 11/19/2014, where was suggested she would benefit from genetics testing. Since this would mandate a significant delay in her final surgery, neoadjuvant chemotherapy was indicated.  The patient's subsequent history is as detailed below  INTERVAL HISTORY: Katie Jacobson returns today for follow-up of her triple negative breast  cancer. The interval history is stable. She is still of course grieving for the loss of her daughter are last year but she is able to "do everything" including housework, shopping, and driving to errands   REVIEW OF SYSTEMS: Katie Jacobson feels "tired all the time". She has 6 steps to climb in her home and when she gets up she just asked to stop this walking here from the parking lot tired her out. She has a very irregular sleep pattern, sometimes not going to bed until 1 or 2 in the morning watching TV, then gets up early in the morning when the dog scatter. She is beginning to take naps. She is gaining weight and tells me her husband and she do need to start looking other diets. She has some loose teeth. She has not been able to work since 2013 and she wonders if she could even do a part-time job. Aside from these issues a detailed review of systems today was stable  PAST MEDICAL HISTORY: Past Medical History:  Diagnosis Date  . Breast cancer of upper-outer quadrant of right female breast ([facility]) 11/14/2014   finished chemo / over year  . Breast cancer, right breast ([place])   . History of chemotherapy    finished chemo 02/03/2015  . History of kidney stones   . History of seizures    as a child - unknown cause - states was never on anticonvulsants  . Hypertension    states under control with meds., has been on med. x "years"  . Mitral valve prolapse 1990  . Osteoporosis   . Seasonal allergies     PAST SURGICAL HISTORY: Past Surgical History:  Procedure Laterality Date  . ABDOMINAL HYSTERECTOMY  1998   partial  . APPENDECTOMY  1976  .  BREAST LUMPECTOMY Right 02/2015  . COLONOSCOPY    . KNEE ARTHROSCOPY Left 03/26/2008  . PLANTAR'S WART EXCISION Left    x 2  . PORT-A-CATH REMOVAL Left 04/06/2015   Procedure: REMOVAL PORT-A-CATH;  Surgeon: Rolm Bookbinder, MD;  Location: [facility];  Service: General;  Laterality: Left;  . PORTACATH PLACEMENT N/A 11/27/2014   Procedure: INSERTION  PORT-A-CATH;  Surgeon: Rolm Bookbinder, MD;  Location: [facility];  Service: General;  Laterality: N/A;  . RADIOACTIVE SEED GUIDED MASTECTOMY WITH AXILLARY SENTINEL LYMPH NODE BIOPSY Right 03/02/2015   Procedure: RADIOACTIVE SEED GUIDED RIGHT PARTIAL MASTECTOMY WITH RIGHT AXILLARY SENTINEL LYMPH NODE BIOPSY;  Surgeon: Rolm Bookbinder, MD;  Location: [facility];  Service: General;  Laterality: Right;  . SALIVARY STONE REMOVAL Right Dec 22, 1970  . TONSILLECTOMY  1970s  . TUBAL LIGATION  1996    FAMILY HISTORY Family History  Problem Relation Age of Onset  . Heart disease Mother   . Stroke Mother   . Hypertension Mother   . Diabetes Mother   . Diabetes Sister   . Kidney disease Brother   . Mental retardation Brother   . Diabetes Sister   . Heart attack Father   . Breast cancer Cousin     deceased 57   the patient knows little about her father. Her mother died at at the age of 50 following a stroke. The patient had 2 brothers, 2 sisters. The only history of breast or ovarian cancer in the family is a first cousin on the mother's side diagnosed with breast cancer at age 46  GYNECOLOGIC HISTORY:  Patient's last menstrual period was 10/10/1996. Menarche age 5, first live birth age 70. The patient is GX P2. She status post simple hysterectomy without salpingo-oophorectomy. She did not take hormone replacement.  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 The patient's husband, Jori Moll "Ron" Voisin works for [employer] in the pattern shop. He also works as a Dispensing optician. Daughter Emiline Mancebo lives in [place] and works in Programmer, applications. Daughter [name] Montelongo died in an [place] 12/22/15.   ADVANCED DIRECTIVES: Not in place   HEALTH MAINTENANCE: Social History  Substance Use Topics  . Smoking status: Current Every Day Smoker    Packs/day: 0.50    Years: 43.00    Types: Cigarettes  . Smokeless tobacco: Never Used      Comment: 10 cig./day  . Alcohol use 0.6 - 2.4 oz/week    1 - 4 Standard drinks or equivalent per week     Colonoscopy:  PAP:  Bone density:  Lipid panel:  Allergies  Allergen Reactions  . Sulfa Antibiotics Other (See Comments)    UNKNOWN    Current Outpatient Prescriptions  Medication Sig Dispense Refill  . albuterol (PROVENTIL HFA;VENTOLIN HFA) 108 (90 BASE) MCG/ACT inhaler Inhale into the lungs every 6 (six) hours as needed for wheezing or shortness of breath.    Marland Kitchen amoxicillin (AMOXIL) 500 MG capsule Take 2 grams (4 pills) prior to dental procedure (Patient not taking: Reported on 09/30/2016) 12 capsule 0  . aspirin 81 MG tablet Take 1 tablet (81 mg total) by mouth daily. 30 tablet 0  . enalapril (VASOTEC) 20 MG tablet TAKE 1 TABLET BY MOUTH EVERY NIGHT AT BEDTIME 90 tablet 3  . fluticasone (FLONASE) 50 MCG/ACT nasal spray Place 1 spray into both nostrils as needed.    . metoprolol tartrate (LOPRESSOR) 25 MG tablet TAKE 1  TABLET(25 MG) BY MOUTH TWICE DAILY 180 tablet 3   No current facility-administered medications for this visit.     OBJECTIVE: Middle-aged African-American woman In no acute distress Vitals:   12/28/16 1419  BP: (!) 141/72  Pulse: 72  Resp: 18  Temp: 97.9 F (36.6 C)     Body mass index is 44.48 kg/m.    ECOG FS:1 - Symptomatic but completely ambulatory  Sclerae unicteric, pupils round and equal Oropharynx clear and moist-- no thrush or other lesions No cervical or supraclavicular adenopathy Lungs no rales or rhonchi Heart regular rate and rhythm Abd soft, obese, nontender, positive bowel sounds MSK no focal spinal tenderness, no upper extremity lymphedema Neuro: nonfocal, well oriented, appropriate affect Breasts: The right breast is status post lumpectomy followed by radiation with no evidence of local recurrence. There is still some hyperpigmentation. There is an area of fatty tissue superiorly to the breast which as of concern to her but is benign  by inspection and palpation. Left breast is unremarkable. Both axillae are benign.  LAB RESULTS:  CMP     Component Value Date/Time   NA 141 09/06/2016 1225   NA 141 06/30/2016 1145   K 4.0 09/06/2016 1225   K 4.3 06/30/2016 1145   CL 104 09/06/2016 1225   CO2 28 09/06/2016 1225   CO2 24 06/30/2016 1145   GLUCOSE 80 09/06/2016 1225   GLUCOSE 85 06/30/2016 1145   BUN 13 09/06/2016 1225   BUN 12.5 06/30/2016 1145   CREATININE 0.85 09/06/2016 1225   CREATININE 0.9 06/30/2016 1145   CALCIUM 9.4 09/06/2016 1225   CALCIUM 9.7 06/30/2016 1145   PROT 7.4 09/06/2016 1225   PROT 7.4 06/30/2016 1145   ALBUMIN 3.9 09/06/2016 1225   ALBUMIN 3.4 (L) 06/30/2016 1145   AST 11 09/06/2016 1225   AST 14 06/30/2016 1145   ALT 12 09/06/2016 1225   ALT 15 06/30/2016 1145   ALKPHOS 74 09/06/2016 1225   ALKPHOS 88 06/30/2016 1145   BILITOT 0.4 09/06/2016 1225   BILITOT 0.38 06/30/2016 1145   GFRNONAA 76 (L) 09/08/2013 1120   GFRNONAA 70 09/02/2013 1128   GFRAA 88 (L) 09/08/2013 1120   GFRAA 81 09/02/2013 1128    INo results found for: SPEP, UPEP  Lab Results  Component Value Date   WBC 5.7 12/28/2016   NEUTROABS 3.1 12/28/2016   HGB 14.4 12/28/2016   HCT 42.7 12/28/2016   MCV 93.6 12/28/2016   PLT 213 12/28/2016      Chemistry      Component Value Date/Time   NA 141 09/06/2016 1225   NA 141 06/30/2016 1145   K 4.0 09/06/2016 1225   K 4.3 06/30/2016 1145   CL 104 09/06/2016 1225   CO2 28 09/06/2016 1225   CO2 24 06/30/2016 1145   BUN 13 09/06/2016 1225   BUN 12.5 06/30/2016 1145   CREATININE 0.85 09/06/2016 1225   CREATININE 0.9 06/30/2016 1145      Component Value Date/Time   CALCIUM 9.4 09/06/2016 1225   CALCIUM 9.7 06/30/2016 1145   ALKPHOS 74 09/06/2016 1225   ALKPHOS 88 06/30/2016 1145   AST 11 09/06/2016 1225   AST 14 06/30/2016 1145   ALT 12 09/06/2016 1225   ALT 15 06/30/2016 1145   BILITOT 0.4 09/06/2016 1225   BILITOT 0.38 06/30/2016 1145       No  results found for: LABCA2  No components found for: LABCA125  No results for input(s): INR in the  last 168 hours.  Urinalysis    Component Value Date/Time   COLORURINE YELLOW 09/06/2016 1225   APPEARANCEUR CLEAR 09/06/2016 1225   LABSPEC 1.020 09/06/2016 1225   PHURINE 6.0 09/06/2016 1225   GLUCOSEU NEGATIVE 09/06/2016 1225   HGBUR SMALL (A) 09/06/2016 1225   BILIRUBINUR NEGATIVE 09/06/2016 [place] 09/06/2016 1225   PROTEINUR NEGATIVE 09/08/2013 1258   UROBILINOGEN 0.2 09/06/2016 1225   NITRITE NEGATIVE 09/06/2016 1225   LEUKOCYTESUR NEGATIVE 09/06/2016 1225    STUDIES: Mammography at the [facility] 10/31/2016 with tomography found the breast density to be category B. There was no evidence of malignancy in either breast.  ASSESSMENT: 62 y.o. [place], [place] woman status post right breast upper outer quadrant biopsy 11/12/2014 for a clinical T1c N0, stage IA invasive ductal carcinoma, triple negative, with an MIB-1 of 19%  (1) neoadjuvant chemotherapy consisting of cyclophosphamide and docetaxel 4 given every 21 days with Neulasta support, first dose 12/02/2014, completed 02/03/2015  (2) genetics testing through the [facility] gene panel at [facility] showed no deleterious mutations in ATM, BARD1, BRCA1, BRCA2, BRIP1, CDH1, CHEK2, MRE11A, MUTYH, NBN, NF1, PALB2, PTEN, RAD50, RAD51C, RAD51D, oe TP53.  (3) status post right lumpectomy and sentinel node sampling 03/02/2015 for a ypT1b ypN0 stage IA invasive ductal carcinoma, grade 1, with negative margins. Repeat HER-2 was again not amplified.  (4) radiation completed August 2016  (5) continuing tobacco abuse. The patient has been strongly advised to quit smoking  PLAN: Katie Jacobson is now a year and a half out from definitive surgery for her breast cancer with no evidence of disease recurrence. This is particularly favorable since triple negative breast cancers, if they are to recur, tend to recur  early.  She understands she has been in remission since her surgery May 2016.  I think she would be less tired and have more energy if she had first of all a more rational eyes sleep pattern and secondly if she exercised some. I gave her information on our tai chi program on Wednesday mornings [facility] encouraged her to participate.  Otherwise she will return to see me again in one year. She knows to call for any problems that may develop before that visit.  Chauncey Cruel, MD   12/28/2016 2:43 PM

## 2017-03-08 ENCOUNTER — Ambulatory Visit: Payer: BLUE CROSS/BLUE SHIELD | Admitting: Family

## 2017-09-08 ENCOUNTER — Encounter: Payer: Self-pay | Admitting: Family

## 2017-09-08 ENCOUNTER — Ambulatory Visit (INDEPENDENT_AMBULATORY_CARE_PROVIDER_SITE_OTHER): Payer: BLUE CROSS/BLUE SHIELD | Admitting: Family

## 2017-09-08 VITALS — BP 135/62 | HR 63 | Temp 98.1°F | Resp 16 | Ht 59.0 in | Wt 227.0 lb

## 2017-09-08 DIAGNOSIS — Z0000 Encounter for general adult medical examination without abnormal findings: Secondary | ICD-10-CM | POA: Diagnosis not present

## 2017-09-08 DIAGNOSIS — R350 Frequency of micturition: Secondary | ICD-10-CM | POA: Diagnosis not present

## 2017-09-08 DIAGNOSIS — Z23 Encounter for immunization: Secondary | ICD-10-CM

## 2017-09-08 MED ORDER — ZOSTER VAC RECOMB ADJUVANTED 50 MCG/0.5ML IM SUSR: INTRAMUSCULAR | 1 refills | Status: DC

## 2017-09-08 NOTE — Patient Instructions (Signed)
Please continue to work on healthy diet, exercise and weight loss.  ° °

## 2017-09-08 NOTE — Progress Notes (Signed)
Subjective:    Patient ID: Katie Jacobson, female    DOB: [birth year], 61 y.o.   MRN: [id number]  HPI  Patient presents today for complete physical.  Immunizations: due for flu shot, shingrix, tetanus up to date.  Diet: diet needs improvement Wt Readings from Last 3 Encounters:  09/08/17 227 lb (103 kg)  12/28/16 220 lb 3.2 oz (99.9 kg)  09/30/16 215 lb (97.5 kg)  Exercise:  Not exercising Colonoscopy:  12/17 Dexa: 11/16 Pap Smear: hysterectomy Mammogram: 10/31/16- she will schedule at the breast center.  Pt is fasting today.   Review of Systems  Constitutional: Positive for unexpected weight change.  HENT: Negative for hearing loss and rhinorrhea.   Eyes:       Needs new glasses has upcoming apt  Respiratory: Negative for cough.   Cardiovascular: Negative for leg swelling.  Gastrointestinal: Negative for constipation and diarrhea.  Genitourinary: Negative for dysuria.       Reports + urinary frequency. "ever since I started the blood pressure medication."    Musculoskeletal:       Does have some arthritis pain, knees  Skin: Negative for rash.  Neurological: Negative for headaches.  Hematological: Negative for adenopathy.  Psychiatric/Behavioral:       Continues to grieve the loss of her daughter. Was seeing a counselor, but stopped because it was not helping   Past Medical History:  Diagnosis Date  . Breast cancer of upper-outer quadrant of right female breast ([facility]) 11/14/2014   finished chemo / over year  . Breast cancer, right breast ([facility])   . History of chemotherapy    finished chemo 02/03/2015  . History of kidney stones   . History of seizures    as a child - unknown cause - states was never on anticonvulsants  . Hypertension    states under control with meds., has been on med. x "years"  . Mitral valve prolapse 1990  . Osteoporosis   . Seasonal allergies      Social History   Socioeconomic History  . Marital status: Married    Spouse name: Not on  file  . Number of children: 2  . Years of education: Not on file  . Highest education level: Not on file  Social Needs  . Financial resource strain: Not on file  . Food insecurity - worry: Not on file  . Food insecurity - inability: Not on file  . Transportation needs - medical: Not on file  . Transportation needs - non-medical: Not on file  Occupational History  . Not on file  Tobacco Use  . Smoking status: Former Smoker    Packs/day: 0.50    Years: 43.00    Pack years: 21.50    Types: Cigarettes  . Smokeless tobacco: Former Systems developer    Quit date: 09/09/2016  . Tobacco comment: 10 cig./day  Substance and Sexual Activity  . Alcohol use: Yes    Alcohol/week: 0.6 - 2.4 oz    Types: 1 - 4 Standard drinks or equivalent per week  . Drug use: No  . Sexual activity: Not on file  Other Topics Concern  . 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  Has been doing part time work with [employer] -- had 6 active clients, but the most important one is her  Daughter.   Regular exercise:  No   Caffeine Use: none; Current 1ppd smoker x > 39 yrs; EtOH ~4 oz/week    Past Surgical History:  Procedure Laterality Date  . ABDOMINAL HYSTERECTOMY  1998   partial  . APPENDECTOMY  1976  . BREAST LUMPECTOMY Right 02/2015  . COLONOSCOPY    . KNEE ARTHROSCOPY Left 03/26/2008  . PLANTAR'S WART EXCISION Left    x 2  . PORT-A-CATH REMOVAL Left 04/06/2015   Procedure: REMOVAL PORT-A-CATH;  Surgeon: Rolm Bookbinder, MD;  Location: [facility];  Service: General;  Laterality: Left;  . PORTACATH PLACEMENT N/A 11/27/2014   Procedure: INSERTION PORT-A-CATH;  Surgeon: Rolm Bookbinder, MD;  Location: [facility];  Service: General;  Laterality: N/A;  . RADIOACTIVE SEED GUIDED PARTIAL MASTECTOMY WITH AXILLARY SENTINEL LYMPH NODE  BIOPSY Right 03/02/2015   Procedure: RADIOACTIVE SEED GUIDED RIGHT PARTIAL MASTECTOMY WITH RIGHT AXILLARY SENTINEL LYMPH NODE BIOPSY;  Surgeon: Rolm Bookbinder, MD;  Location: [facility];  Service: General;  Laterality: Right;  . SALIVARY STONE REMOVAL Right 1972  . TONSILLECTOMY  1970s  . TUBAL LIGATION  1996    Family History  Problem Relation Age of Onset  . Heart disease Mother   . Stroke Mother   . Hypertension Mother   . Diabetes Mother   . Diabetes Sister   . Kidney disease Brother   . Mental retardation Brother   . Diabetes Sister   . Heart attack Father   . Breast cancer Cousin        deceased 60    Allergies  Allergen Reactions  . Sulfa Antibiotics Other (See Comments)    UNKNOWN    Current Outpatient Medications on File Prior to Visit  Medication Sig Dispense Refill  . albuterol (PROVENTIL HFA;VENTOLIN HFA) 108 (90 BASE) MCG/ACT inhaler Inhale into the lungs every 6 (six) hours as needed for wheezing or shortness of breath.    Marland Kitchen amoxicillin (AMOXIL) 500 MG capsule Take 2 grams (4 pills) prior to dental procedure 12 capsule 0  . aspirin 81 MG tablet Take 1 tablet (81 mg total) by mouth daily. 30 tablet 0  . enalapril (VASOTEC) 20 MG tablet TAKE 1 TABLET BY MOUTH EVERY NIGHT AT BEDTIME 90 tablet 3  . fluticasone (FLONASE) 50 MCG/ACT nasal spray Place 1 spray into both nostrils as needed.    . metoprolol tartrate (LOPRESSOR) 25 MG tablet TAKE 1 TABLET(25 MG) BY MOUTH TWICE DAILY 180 tablet 3   No current facility-administered medications on file prior to visit.     BP 135/62   Pulse 63   Temp 98.1 F (36.7 C) (Oral)   Resp 16   Ht 4\' 11"  (1.499 m)   Wt 227 lb (103 kg)   LMP 10/10/1996   SpO2 100%   BMI 45.85 kg/m       Objective:   Physical Exam  Physical Exam  Constitutional: She is oriented to person, place, and time. She appears well-developed and well-nourished. No distress.  HENT:  Head: Normocephalic and atraumatic.  Right  Ear: Tympanic membrane and ear canal normal.  Left Ear: Tympanic membrane and ear canal normal.  Mouth/Throat: Oropharynx is clear and moist.  Eyes: Pupils are equal, round, and reactive to light. No scleral icterus.  Neck: Normal range of motion. No thyromegaly present.  Cardiovascular: Normal rate and regular rhythm.   Grade 1 systolic murmur heard. Pulmonary/Chest: Effort normal and breath sounds normal. No respiratory distress. He has no  wheezes. She has no rales. She exhibits no tenderness.  Abdominal: Soft. Bowel sounds are normal. She exhibits no distension and no mass. There is no tenderness. There is no rebound and no guarding.  Musculoskeletal: She exhibits no edema.  Lymphadenopathy:    She has no cervical adenopathy.  Neurological: She is alert and oriented to person, place, and time. She has normal patellar reflexes. She exhibits normal muscle tone. Coordination normal.  Skin: Skin is warm and dry.  Psychiatric: She has a normal mood and affect. Her behavior is normal. Judgment and thought content normal.  Breasts: Examined lying Right: Without masses, retractions, discharge or axillary adenopathy.  Left: Without masses, retractions, discharge or axillary adenopathy.  Pelvic: deferred         Assessment & Plan:   Preventative care- discussed healthy diet, exercise, weight loss. Obtain routine lab work. Will check urine culture due to urinary frequency. She will schedule mammogram in January.       Assessment & Plan:  EKG tracing is personally reviewed.  EKG notes NSR.  No acute changes.

## 2017-09-09 LAB — CBC WITH DIFFERENTIAL/PLATELET
BASOS ABS: 33 {cells}/uL (ref 0–200)
Basophils Relative: 0.5 %
EOS PCT: 1.2 %
Eosinophils Absolute: 79 cells/uL (ref 15–500)
HEMATOCRIT: 40.6 % (ref 35.0–45.0)
Hemoglobin: 13.9 g/dL (ref 11.7–15.5)
LYMPHS ABS: 1894 {cells}/uL (ref 850–3900)
MCH: 31.4 pg (ref 27.0–33.0)
MCHC: 34.2 g/dL (ref 32.0–36.0)
MCV: 91.6 fL (ref 80.0–100.0)
MPV: 12 fL (ref 7.5–12.5)
Monocytes Relative: 7.6 %
NEUTROS PCT: 62 %
Neutro Abs: 4092 cells/uL (ref 1500–7800)
PLATELETS: 226 10*3/uL (ref 140–400)
RBC: 4.43 10*6/uL (ref 3.80–5.10)
RDW: 13.8 % (ref 11.0–15.0)
TOTAL LYMPHOCYTE: 28.7 %
WBC: 6.6 10*3/uL (ref 3.8–10.8)
WBCMIX: 502 {cells}/uL (ref 200–950)

## 2017-09-09 LAB — URINALYSIS, ROUTINE W REFLEX MICROSCOPIC
BILIRUBIN URINE: NEGATIVE
GLUCOSE, UA: NEGATIVE
Hgb urine dipstick: NEGATIVE
Ketones, ur: NEGATIVE
LEUKOCYTES UA: NEGATIVE
Nitrite: NEGATIVE
Protein, ur: NEGATIVE
SPECIFIC GRAVITY, URINE: 1.018 (ref 1.001–1.03)
pH: 6.5 (ref 5.0–8.0)

## 2017-09-09 LAB — TSH: TSH: 0.75 m[IU]/L (ref 0.40–4.50)

## 2017-09-09 LAB — HEPATIC FUNCTION PANEL
AG Ratio: 1.3 (calc) (ref 1.0–2.5)
ALKALINE PHOSPHATASE (APISO): 70 U/L (ref 33–130)
ALT: 14 U/L (ref 6–29)
AST: 13 U/L (ref 10–35)
Albumin: 3.9 g/dL (ref 3.6–5.1)
BILIRUBIN DIRECT: 0.1 mg/dL (ref 0.0–0.2)
BILIRUBIN TOTAL: 0.5 mg/dL (ref 0.2–1.2)
Globulin: 2.9 g/dL (calc) (ref 1.9–3.7)
Indirect Bilirubin: 0.4 mg/dL (calc) (ref 0.2–1.2)
Total Protein: 6.8 g/dL (ref 6.1–8.1)

## 2017-09-09 LAB — BASIC METABOLIC PANEL
BUN: 14 mg/dL (ref 7–25)
CO2: 27 mmol/L (ref 20–32)
CREATININE: 0.86 mg/dL (ref 0.50–1.05)
Calcium: 9.4 mg/dL (ref 8.6–10.4)
Chloride: 103 mmol/L (ref 98–110)
GLUCOSE: 77 mg/dL (ref 65–99)
Potassium: 4.2 mmol/L (ref 3.5–5.3)
Sodium: 140 mmol/L (ref 135–146)

## 2017-09-09 LAB — LIPID PANEL
Cholesterol: 204 mg/dL — ABNORMAL HIGH (ref <200)
HDL: 86 mg/dL (ref >50)
LDL Cholesterol (Calc): 101 mg/dL (calc) — ABNORMAL HIGH
Non-HDL Cholesterol (Calc): 118 mg/dL (calc) (ref <130)
Total CHOL/HDL Ratio: 2.4 (calc) (ref <5.0)
Triglycerides: 82 mg/dL (ref <150)

## 2017-09-09 LAB — URINE CULTURE
MICRO NUMBER:: 81348088
SPECIMEN QUALITY:: ADEQUATE

## 2017-09-10 ENCOUNTER — Encounter: Payer: Self-pay | Admitting: Family

## 2017-09-26 ENCOUNTER — Ambulatory Visit: Payer: BLUE CROSS/BLUE SHIELD | Admitting: Cardiology

## 2017-09-28 ENCOUNTER — Other Ambulatory Visit: Payer: Self-pay | Admitting: Oncology

## 2017-09-28 DIAGNOSIS — Z853 Personal history of malignant neoplasm of breast: Secondary | ICD-10-CM

## 2017-10-08 ENCOUNTER — Other Ambulatory Visit: Payer: Self-pay | Admitting: Cardiology

## 2017-11-10 ENCOUNTER — Ambulatory Visit
Admission: RE | Admit: 2017-11-10 | Discharge: 2017-11-10 | Disposition: A | Discharge status: Home / Self Care | Payer: BLUE CROSS/BLUE SHIELD | Source: Ambulatory Visit | Attending: Oncology | Admitting: Oncology

## 2017-11-10 DIAGNOSIS — Z853 Personal history of malignant neoplasm of breast: Secondary | ICD-10-CM

## 2017-11-10 HISTORY — DX: Personal history of antineoplastic chemotherapy: Z92.21

## 2017-11-10 HISTORY — DX: Personal history of irradiation: Z92.3

## 2017-11-13 ENCOUNTER — Encounter: Payer: Self-pay | Admitting: Cardiology

## 2017-11-13 ENCOUNTER — Telehealth: Payer: Self-pay | Admitting: Cardiology

## 2017-11-13 ENCOUNTER — Ambulatory Visit: Payer: BLUE CROSS/BLUE SHIELD | Admitting: Cardiology

## 2017-11-13 VITALS — BP 128/73 | HR 81 | Ht 61.0 in | Wt 229.6 lb

## 2017-11-13 DIAGNOSIS — E785 Hyperlipidemia, unspecified: Secondary | ICD-10-CM | POA: Diagnosis not present

## 2017-11-13 DIAGNOSIS — Z720 Tobacco use: Secondary | ICD-10-CM

## 2017-11-13 DIAGNOSIS — R002 Palpitations: Secondary | ICD-10-CM

## 2017-11-13 DIAGNOSIS — R011 Cardiac murmur, unspecified: Secondary | ICD-10-CM | POA: Diagnosis not present

## 2017-11-13 DIAGNOSIS — I341 Nonrheumatic mitral (valve) prolapse: Secondary | ICD-10-CM

## 2017-11-13 DIAGNOSIS — I10 Essential (primary) hypertension: Secondary | ICD-10-CM | POA: Diagnosis not present

## 2017-11-13 DIAGNOSIS — E669 Obesity, unspecified: Secondary | ICD-10-CM

## 2017-11-13 MED ORDER — MAGNESIUM 400 MG PO TABS: 400.0000 mg | ORAL_TABLET | Freq: Every day | ORAL | Status: DC | PRN

## 2017-11-13 MED ORDER — POTASSIUM 99 MG PO TABS: 1.0000 | ORAL_TABLET | Freq: Every day | ORAL | Status: DC | PRN

## 2017-11-13 NOTE — Patient Instructions (Addendum)
NO MEDICATION CHANGES   Your physician discussed the importance of regular exercise, diet and recommended that you start or continue a regular exercise program for good health.  SCHEDULE AT [facility] has requested that you have an echocardiogram. Echocardiography is a painless test that uses sound waves to create images of your heart. It provides your doctor with information about the size and shape of your heart and how well your heart's chambers and valves are working. This procedure takes approximately one hour. There are no restrictions for this procedure.   Your physician wants you to follow-up in [place]. You will receive a reminder letter in the mail two months in advance. If you don't receive a letter, please call our office to schedule the follow-up appointment.

## 2017-11-13 NOTE — Telephone Encounter (Signed)
Returned the call to the patient. She stated that she has been experiencing muscle cramps lately. She has increased her water intake but was wondering what Dr. Ellyn Hack thought about taking potassium 99 mg and Magnesium 400 mg daily. She had an appointment today with Dr. Ellyn Hack but forgot to mention this. She has been advised to not start the medication until reccommended by her physician.

## 2017-11-13 NOTE — Progress Notes (Signed)
PCP: Debbrah Alar, NP  Clinic Note: Chief Complaint  Patient presents with  . Follow-up    Hypertension, murmur (history of "mitral prolapse "), palpitations    HPI: Katie Jacobson is a 62 y.o. female with a PMH below who presents today for delayed annual f/u: HTN, HLD, Palpitations.  TIOMBE TOMEO was last seen by me in October 2017 - Was doing relatively well, stable.  Still noted occasional fluttering off and on.  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  A bit out of shape having gone through chemotherapy and radiation.   Recent Hospitalizations:  -- undergoing Rx for Breast Cancer  - s/p Lumpectomy May 2016 he has now had chemotherapy and radiation that was completed at the end of September. She still has not fully recovered from chemotherapy and radiation, stating that she still is a bit tired.  Was mourning the loss of her daughter (died at age 31 in a car accident)  Studies Reviewed: no new studies  Interval History: Katie Jacobson presents today noting that other than occasional flutters off and on, she is doing well.  She denies any sensation of chest tightness or pressure with rest or exertion.  Unfortunately she is gained quite a bit of weight since the loss of her daughter.  She and her husband are probably doing some "comfort eating ".  He now is retired and insists on having full course meals 3 meals a day, and she is falling into the trap of eating with him.  Unfortunately she has not been able to actually walk and exercise because her hips and knees bother her. She is wearing exercise closed today, but has not been on to keep up exercise with the amount of weight gain.  Still smoking.  Not yet ready to quit.  Remainder of cardiac review of symptoms: No PND, orthopnea or edema.  No palpitations, lightheadedness, dizziness, weakness or syncope/near syncope. No TIA/amaurosis fugax symptoms. No claudication.   ROS: Review of Systems  Constitutional: Negative  for malaise/fatigue and weight loss (Weight gain).  HENT: Negative for nosebleeds.   Respiratory: Negative for cough and shortness of breath.   Cardiovascular: Negative for claudication.       Per HPI  Gastrointestinal: Negative for blood in stool and melena.  Genitourinary: Negative for hematuria.  Musculoskeletal: Positive for joint pain (Mild aches and pains).  Neurological: Positive for dizziness (Sometimes positional).  Endo/Heme/Allergies: Negative for environmental allergies. Does not bruise/bleed easily.  Psychiatric/Behavioral: Negative.  Negative for depression and memory loss. The patient is not nervous/anxious and does not have insomnia.     Past Medical History:  Diagnosis Date  . Breast cancer of upper-outer quadrant of right female breast ([facility]) 11/14/2014   finished chemo / over year  . Breast cancer, right breast ([facility])   . History of chemotherapy    finished chemo 02/03/2015  . History of kidney stones   . History of seizures    as a child - unknown cause - states was never on anticonvulsants  . Hypertension    states under control with meds., has been on med. x "years"  . Mitral valve prolapse 1990  . Osteoporosis   . Personal history of chemotherapy   . Personal history of radiation therapy   . Seasonal allergies     Past Surgical History:  Procedure Laterality Date  . ABDOMINAL HYSTERECTOMY  1998   partial  . APPENDECTOMY  1976  . BREAST LUMPECTOMY Right 02/2015  . COLONOSCOPY    .  KNEE ARTHROSCOPY Left 03/26/2008  . PLANTAR'S WART EXCISION Left    x 2  . PORT-A-CATH REMOVAL Left 04/06/2015   Procedure: REMOVAL PORT-A-CATH;  Surgeon: Rolm Bookbinder, MD;  Location: [facility];  Service: General;  Laterality: Left;  . PORTACATH PLACEMENT N/A 11/27/2014   Procedure: INSERTION PORT-A-CATH;  Surgeon: Rolm Bookbinder, MD;  Location: [facility];  Service: General;  Laterality: N/A;  . RADIOACTIVE SEED GUIDED PARTIAL MASTECTOMY WITH  AXILLARY SENTINEL LYMPH NODE BIOPSY Right 03/02/2015   Procedure: RADIOACTIVE SEED GUIDED RIGHT PARTIAL MASTECTOMY WITH RIGHT AXILLARY SENTINEL LYMPH NODE BIOPSY;  Surgeon: Rolm Bookbinder, MD;  Location: [facility];  Service: General;  Laterality: Right;  . SALIVARY STONE REMOVAL Right January 13, 1971  . TONSILLECTOMY  1970s  . TUBAL LIGATION  1996    Current Outpatient Medications on File Prior to Visit  Medication Sig Dispense Refill  . albuterol (PROVENTIL HFA;VENTOLIN HFA) 108 (90 BASE) MCG/ACT inhaler Inhale into the lungs every 6 (six) hours as needed for wheezing or shortness of breath.    Marland Kitchen amoxicillin (AMOXIL) 500 MG capsule Take 2 grams (4 pills) prior to dental procedure 12 capsule 0  . aspirin 81 MG tablet Take 1 tablet (81 mg total) by mouth daily. 30 tablet 0  . chlorthalidone (HYGROTON) 25 MG tablet TAKE 1 TABLET BY MOUTH EVERY OTHER DAY 45 tablet 0  . enalapril (VASOTEC) 20 MG tablet TAKE 1 TABLET BY MOUTH EVERY NIGHT AT BEDTIME 90 tablet 0  . fluticasone (FLONASE) 50 MCG/ACT nasal spray Place 1 spray into both nostrils as needed.    . metoprolol tartrate (LOPRESSOR) 25 MG tablet TAKE 1 TABLET(25 MG) BY MOUTH TWICE DAILY 180 tablet 0   No current facility-administered medications on file prior to visit.     Allergies  Allergen Reactions  . Sulfa Antibiotics Other (See Comments)    UNKNOWN   Social History   Tobacco Use  . Smoking status: Former Smoker    Packs/day: 0.50    Years: 43.00    Pack years: 21.50    Types: Cigarettes  . Smokeless tobacco: Former Systems developer    Quit date: 09/09/2016  . Tobacco comment: 10 cig./day  Substance Use Topics  . Alcohol use: Yes    Alcohol/week: 0.6 - 2.4 oz    Types: 1 - 4 Standard drinks or equivalent per week  . 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  Has been unemployed x ~1  yr.  Has been doing part time work with [employer] -- had 6 active clients, but the most important one is her Daughter.   Regular exercise:  No   Caffeine Use: none; Current 1ppd smoker x > 39 yrs; EtOH ~4 oz/week    Family History  Problem Relation Age of Onset  . Heart disease Mother   . Stroke Mother   . Hypertension Mother   . Diabetes Mother   . Diabetes Sister   . Kidney disease Brother   . Mental retardation Brother   . Diabetes Sister   . Heart attack Father   . Breast cancer Cousin        deceased 96    Wt Readings from Last 3 Encounters:  11/13/17 229 lb 9.6 oz (104.1 kg)  09/08/17 227 lb (103 kg)  12/28/16 220 lb 3.2  oz (99.9 kg)  last Year 03/2014: 215 lb  PHYSICAL EXAM BP 128/73   Pulse 81   Ht 5\' 1"  (1.549 m)   Wt 229 lb 9.6 oz (104.1 kg)   LMP 10/10/1996   SpO2 98%   BMI 43.38 kg/m   Physical Exam  Constitutional: She is oriented to person, place, and time. She appears well-developed and well-nourished. No distress.  Well-groomed.  Morbidly obese  HENT:  Head: Normocephalic and atraumatic.  Neck: Normal range of motion. Neck supple. No hepatojugular reflux and no JVD present. Carotid bruit is not present.  Cardiovascular: Normal rate, regular rhythm, S1 normal and S2 normal.  No extrasystoles are present. Exam reveals distant heart sounds. Exam reveals no gallop.  Murmur heard. Soft SEM at the base without radiation.  May be soft HSM at apex as well.  Difficult to auscultate due to body habitus.  Pulmonary/Chest: Effort normal and breath sounds normal. No respiratory distress. She has no wheezes. She has no rales.  Abdominal: Soft. Bowel sounds are normal. She exhibits no distension. There is no tenderness.  Musculoskeletal: Normal range of motion. She exhibits no edema.  Notable weight gain in thighs and buttock  Neurological: She is alert and oriented to person, place, and time.  Psychiatric: She has a normal mood and affect. Her behavior is  normal. Judgment and thought content normal.    Adult ECG Report Not checked  Other studies Reviewed: Additional studies/ records that were reviewed today include:  Recent Labs:  No new labs - PCP checks in November Lab Results  Component Value Date   CHOL 204 (H) 09/08/2017   HDL 86 09/08/2017   LDLCALC 101 (H) 09/08/2017   TRIG 82 09/08/2017   CHOLHDL 2.4 09/08/2017    ASSESSMENT / PLAN: Problem List Items Addressed This Visit    Essential hypertension (Chronic)    Well-controlled on chlorthalidone and enalapril plus low-dose beta-blocker.      Heart murmur - Primary    I have not really heard a murmur on her before.  This the first time I have heard a murmur, but cannot really place it based on very difficult body habitus (very large, thick breasts). Plan check 2D echocardiogram to evaluate cause for murmur:.  She had a history of mitral valve prolapse      Relevant Orders   ECHOCARDIOGRAM COMPLETE   Hyperlipidemia LDL goal <100 (Chronic)    LDL is just at 101 by last check.  Not currently on statin.  Hopefully with weight loss, her cholesterol will improve.  She would like to try to do this without medical treatment..      Intermittent palpitations (Chronic)    Well-controlled on beta-blocker.  No recurrence      Relevant Orders   ECHOCARDIOGRAM COMPLETE   Mitral valve prolapse (Chronic)     Due To distant heart sounds, difficult to auscultate any click.  There may be a soft systolic murmur.  Plan: Check 2D echocardiogram to reassess.      Relevant Orders   ECHOCARDIOGRAM COMPLETE   Obesity (BMI 35.0-39.9 without comorbidity) (Chronic)    Unfortunately, all the weight that she had previously loss is now back on.  We talked for quite a while about her need to adjust her diet and increase exercise.  She may need to do water aerobics, she also talked about trying [place]      Tobacco abuse (Chronic)    Still smoking.  Knows she  needs to stop.  She says she will try to work on it.  Not quite ready to quit.         Current medicines are reviewed at length with the patient today. (+/- concerns)-n/a  The following changes have been made: None  Patient Instructions  NO MEDICATION CHANGES   Your physician discussed the importance of regular exercise, diet and recommended that you start or continue a regular exercise program for good health.  [facility] has requested that you have an echocardiogram. Echocardiography is a painless test that uses sound waves to create images of your heart. It provides your doctor with information about the size and shape of your heart and how well your heart's chambers and valves are working. This procedure takes approximately one hour. There are no restrictions for this procedure.   Your physician wants you to follow-up in [place]. You will receive a reminder letter in the mail two months in advance. If you don't receive a letter, please call our office to schedule the follow-up appointment.    Studies Ordered:   Orders Placed This Encounter  Procedures  . ECHOCARDIOGRAM COMPLETE     Glenetta Hew, M.D., M.S. Interventional Cardiologist   Pager # [phone]

## 2017-11-13 NOTE — Telephone Encounter (Signed)
New message ° °Pt verbalized that she is calling for the RN °

## 2017-11-13 NOTE — Telephone Encounter (Signed)
Taking OTC potassium hurt, and may help.  Taking magnesium depending on the formulation may make her have bowel movements.  Would recommend magnesium oxide and not magnesium citrate

## 2017-11-13 NOTE — Telephone Encounter (Signed)
Spoke to patient.   PATIENT STATES SHE IS TAKING MAGNESIUM OXIDE 400 mg. Per Dr Ellyn Hack it is okay to use  These medications. Patient voiced understanding, will added to med list

## 2017-11-15 ENCOUNTER — Encounter: Payer: Self-pay | Admitting: Cardiology

## 2017-11-15 NOTE — Assessment & Plan Note (Signed)
LDL is just at 101 by last check.  Not currently on statin.  Hopefully with weight loss, her cholesterol will improve.  She would like to try to do this without medical treatment.Katie Jacobson

## 2017-11-15 NOTE — Assessment & Plan Note (Signed)
Well-controlled on beta-blocker.  No recurrence

## 2017-11-15 NOTE — Assessment & Plan Note (Signed)
Still smoking.  Knows she needs to stop.  She says she will try to work on it.  Not quite ready to quit.

## 2017-11-15 NOTE — Assessment & Plan Note (Addendum)
Unfortunately, all the weight that she had previously loss is now back on.  We talked for quite a while about her need to adjust her diet and increase exercise.  She may need to do water aerobics, she also talked about trying to [place]

## 2017-11-15 NOTE — Assessment & Plan Note (Signed)
Due To distant heart sounds, difficult to auscultate any click.  There may be a soft systolic murmur.  Plan: Check 2D echocardiogram to reassess.

## 2017-11-15 NOTE — Assessment & Plan Note (Signed)
Well-controlled on chlorthalidone and enalapril plus low-dose beta-blocker.

## 2017-11-15 NOTE — Assessment & Plan Note (Signed)
>>  ASSESSMENT AND PLAN FOR MITRAL VALVE PROLAPSE WRITTEN ON 11/15/2017  5:53 PM BY HARDING, DAVID W, MD   Due To distant heart sounds, difficult to auscultate any click.  There may be a soft systolic murmur.  Plan: Check 2D echocardiogram to reassess.

## 2017-11-15 NOTE — Assessment & Plan Note (Signed)
I have not really heard a murmur on her before.  This the first time I have heard a murmur, but cannot really place it based on very difficult body habitus (very large, thick breasts). Plan check 2D echocardiogram to evaluate cause for murmur:.  She had a history of mitral valve prolapse

## 2017-11-20 ENCOUNTER — Other Ambulatory Visit: Payer: Self-pay

## 2017-11-20 ENCOUNTER — Ambulatory Visit (HOSPITAL_COMMUNITY): Payer: BLUE CROSS/BLUE SHIELD | Attending: Cardiology

## 2017-11-20 DIAGNOSIS — E785 Hyperlipidemia, unspecified: Secondary | ICD-10-CM | POA: Diagnosis not present

## 2017-11-20 DIAGNOSIS — I10 Essential (primary) hypertension: Secondary | ICD-10-CM | POA: Diagnosis not present

## 2017-11-20 DIAGNOSIS — I341 Nonrheumatic mitral (valve) prolapse: Secondary | ICD-10-CM

## 2017-11-20 DIAGNOSIS — Z6841 Body Mass Index (BMI) 40.0 and over, adult: Secondary | ICD-10-CM | POA: Diagnosis not present

## 2017-11-20 DIAGNOSIS — R011 Cardiac murmur, unspecified: Secondary | ICD-10-CM

## 2017-11-20 DIAGNOSIS — I083 Combined rheumatic disorders of mitral, aortic and tricuspid valves: Secondary | ICD-10-CM | POA: Diagnosis not present

## 2017-11-20 DIAGNOSIS — E669 Obesity, unspecified: Secondary | ICD-10-CM | POA: Diagnosis not present

## 2017-11-20 DIAGNOSIS — R002 Palpitations: Secondary | ICD-10-CM | POA: Diagnosis not present

## 2017-11-24 ENCOUNTER — Telehealth: Payer: Self-pay | Admitting: *Deleted

## 2017-11-24 DIAGNOSIS — I341 Nonrheumatic mitral (valve) prolapse: Secondary | ICD-10-CM

## 2017-11-24 DIAGNOSIS — I359 Nonrheumatic aortic valve disorder, unspecified: Secondary | ICD-10-CM

## 2017-11-24 DIAGNOSIS — R011 Cardiac murmur, unspecified: Secondary | ICD-10-CM

## 2017-11-24 DIAGNOSIS — I340 Nonrheumatic mitral (valve) insufficiency: Secondary | ICD-10-CM

## 2017-11-24 NOTE — Telephone Encounter (Signed)
Spoke to patient. Result given . Verbalized understanding [place]

## 2017-11-24 NOTE — Telephone Encounter (Signed)
-----   Message from Leonie Man, MD sent at 11/21/2017  6:17 PM EST ----- Transthoracic echocardiogram results showed normal left ventricle function with ejection fraction of 60-65%.  There is no wall motion normalities.  However the heart (left ventricle) itself does have abnormal relaxation grade 2 diastolic dysfunction.  With a bit more than usual for age.-The aortic valve has mild to moderate back leaking with some calcification of the leaflets.  The mitral valve has will be described as moderate regurgitation with thickening of the leaflets.  This is not necessarily mitral prolapse, but more related to degenerative disease of the valve.  We would probably follow this up at least with an echocardiogram next year to make sure that the valve function is not worsening.  The treatment options right now would be make sure we have adequate blood pressure control.Glenetta Hew, MD

## 2017-12-26 NOTE — Progress Notes (Signed)
I have reviewed the above documentation for accuracy and completeness, and I agree with the above.  [facility]  [phone]) [phone] [phone]     ID: Katie Jacobson DOB: December 06, 1955  MR#: [id number]  JYN#:[id number]  Patient Care Team: [name], NP as PCP - General (Internal Medicine) Miguel Aschoff, MD (Obstetrics and Gynecology) Emelia Loron, MD as Consulting Physician (General Surgery) Lashica Hannay, Valentino Hue, MD as Consulting Physician (Oncology) Lonie Peak, MD as Attending Physician (Radiation Oncology) Donnelly Angelica, RN as Registered Nurse Pershing Proud, RN as Registered Nurse Hubbard Hartshorn, NP as Nurse Practitioner (Nurse Practitioner) Salomon Fick, NP as Nurse Practitioner (Hematology and Oncology) OTHER MD:  CHIEF COMPLAINT: Triple negative breast cancer  CURRENT TREATMENT: observation  BREAST CANCER HISTORY: From the original intake note:  The patient herself palpated a mass in her right breast there is she brought it to her gynecologist, Dr. Charlott Rakes attention and he obtained screening mammography which suggested an abnormality in the right breast. On 10/24/2014 the patient underwent right diagnostic mammography and ultrasonography at the [facility] Center. The breast density was category B. There was an ill-defined mass in the upper outer quadrant of the right breast which was palpable on exam. By ultrasound this was irregular and hypoechoic measuring 1.2 cm. Evaluation of the right axilla was negative.  Biopsy of the mass in question showed an invasive ductal carcinoma, grade 2 or 3, triple negative, with an MIB-1 of 19%.  Katie Jacobson case was presented at the multidisciplinary breast cancer conference 11/19/2014, where was suggested she would benefit from genetics testing. Since this would mandate a significant delay in her final surgery, neoadjuvant chemotherapy was indicated.  The patient's subsequent history  is as detailed below  INTERVAL HISTORY: Katie Jacobson returns today for follow-up of her triple negative breast cancer. She continues on observation. Since her last visit, she underwent diagnostic bilateral mammography with CAD and tomography on [facility]/10[facility] showing: breast density category B. There was no evidence of malignancy.    REVIEW OF SYSTEMS: Katie Jacobson reports that she is "okay".  She sometimes has sensation in the right breast like a large rubber band is being stretched.  This is painful and lasts about a minute.  She is going through a hard.  But she says "this too shall pass".  She does line dancing once a week. She also does some walking during the week.  She denies unusual headaches, visual changes, nausea, vomiting, or dizziness. There has been no unusual cough, phlegm production, or pleurisy. This been no change in bowel or bladder habits. She denies unexplained fatigue or unexplained weight loss, bleeding, rash, or fever. A detailed review of systems was otherwise stable.    PAST MEDICAL HISTORY: Past Medical History:  Diagnosis Date  . Breast cancer of upper-outer quadrant of right female breast (HCC) 11/14/2014   finished chemo / over year  . Breast cancer, right breast (HCC)   . History of chemotherapy    finished chemo 02/03/2015  . History of kidney stones   . History of seizures    as a child - unknown cause - states was never on anticonvulsants  . Hypertension    states under control with meds., has been on med. x "years"  . Mitral valve prolapse 1990  . Osteoporosis   . Personal history of chemotherapy   . Personal history of radiation therapy   . Seasonal allergies     PAST SURGICAL HISTORY: Past Surgical  History:  Procedure Laterality Date  . ABDOMINAL HYSTERECTOMY  1998   partial  . APPENDECTOMY  1976  . BREAST LUMPECTOMY Right 02/2015  . COLONOSCOPY    . KNEE ARTHROSCOPY Left 03/26/2008  . PLANTAR'S WART EXCISION Left    x 2  . PORT-A-CATH  REMOVAL Left 04/06/2015   Procedure: REMOVAL PORT-A-CATH;  Surgeon: Emelia Loron, MD;  Location: [facility];  Service: General;  Laterality: Left;  . PORTACATH PLACEMENT N/A 11/27/2014   Procedure: INSERTION PORT-A-CATH;  Surgeon: Emelia Loron, MD;  Location: [facility];  Service: General;  Laterality: N/A;  . RADIOACTIVE SEED GUIDED PARTIAL MASTECTOMY WITH AXILLARY SENTINEL LYMPH NODE BIOPSY Right 03/02/2015   Procedure: RADIOACTIVE SEED GUIDED RIGHT PARTIAL MASTECTOMY WITH RIGHT AXILLARY SENTINEL LYMPH NODE BIOPSY;  Surgeon: Emelia Loron, MD;  Location: [facility];  Service: General;  Laterality: Right;  . SALIVARY STONE REMOVAL Right 1972  . TONSILLECTOMY  1970s  . TUBAL LIGATION  1996    FAMILY HISTORY Family History  Problem Relation Age of Onset  . Heart disease Mother   . Stroke Mother   . Hypertension Mother   . Diabetes Mother   . Diabetes Sister   . Kidney disease Brother   . Mental retardation Brother   . Diabetes Sister   . Heart attack Father   . Breast cancer Cousin        deceased 85   the patient knows little about her father. Her mother died at at the age of 32 following a stroke. The patient had 2 brothers, 2 sisters. The only history of breast or ovarian cancer in the family is a first cousin on the mother's side diagnosed with breast cancer at age 84  GYNECOLOGIC HISTORY:  Patient's last menstrual period was 10/10/1996. Menarche age 64, first live birth age 66. The patient is GX P2. She status post simple hysterectomy without salpingo-oophorectomy. She did not take hormone replacement.  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 The patient's husband, Windy Fast "Ron" Rindal works for [employer] in the pattern shop. He also works as a Geologist, engineering. Daughter Lashon Hartzel lives in [place] and works in Presenter, broadcasting. Daughter [name] Nippert died in an AA  Aug 30, 2016.   ADVANCED DIRECTIVES: Not in place   HEALTH MAINTENANCE: Social History   Tobacco Use  . Smoking status: Former Smoker    Packs/day: 0.50    Years: 43.00    Pack years: 21.50    Types: Cigarettes  . Smokeless tobacco: Former Neurosurgeon    Quit date: 09/09/2016  . Tobacco comment: 10 cig./day  Substance Use Topics  . Alcohol use: Yes    Alcohol/week: 0.6 - 2.4 oz    Types: 1 - 4 Standard drinks or equivalent per week  . Drug use: No     Colonoscopy:  PAP:  Bone density:  Lipid panel:  Allergies  Allergen Reactions  . Sulfa Antibiotics Other (See Comments)    UNKNOWN    Current Outpatient Medications  Medication Sig Dispense Refill  . albuterol (PROVENTIL HFA;VENTOLIN HFA) 108 (90 BASE) MCG/ACT inhaler Inhale into the lungs every 6 (six) hours as needed for wheezing or shortness of breath.    Marland Kitchen amoxicillin (AMOXIL) 500 MG capsule Take 2 grams (4 pills) prior to dental procedure 12 capsule 0  . aspirin 81 MG tablet Take 1 tablet (81 mg total) by mouth daily. 30 tablet 0  . chlorthalidone (HYGROTON)  25 MG tablet TAKE 1 TABLET BY MOUTH EVERY OTHER DAY 45 tablet 0  . enalapril (VASOTEC) 20 MG tablet TAKE 1 TABLET BY MOUTH EVERY NIGHT AT BEDTIME 90 tablet 0  . fluticasone (FLONASE) 50 MCG/ACT nasal spray Place 1 spray into both nostrils as needed.    . Magnesium 400 MG TABS Take 400 mg by mouth daily as needed.    . metoprolol tartrate (LOPRESSOR) 25 MG tablet TAKE 1 TABLET(25 MG) BY MOUTH TWICE DAILY 180 tablet 0  . Potassium 99 MG TABS Take 1 tablet (99 mg total) by mouth daily as needed.     No current facility-administered medications for this visit.     OBJECTIVE: Middle-aged African-American woman who was tearful during today's visit Vitals:   12/28/17 1358  BP: (!) 146/79  Pulse: 72  Resp: 18  Temp: 98.2 F (36.8 C)  SpO2: 100%     Body mass index is 43.87 kg/m.    ECOG FS:1 - Symptomatic but completely ambulatory  Sclerae unicteric, EOMs  intact Oropharynx clear and moist No cervical or supraclavicular adenopathy Lungs no rales or rhonchi Heart regular rate and rhythm Abd soft, nontender, positive bowel sounds MSK no focal spinal tenderness, no upper extremity lymphedema Neuro: nonfocal, well oriented, appropriate affect Breasts: The right breast is undergone lumpectomy and radiation.  There is no evidence of local recurrence.  The left breast is benign.  Both axillae are benign.  LAB RESULTS:  CMP     Component Value Date/Time   NA 140 09/08/2017 1542   NA 139 12/28/2016 1359   K 4.2 09/08/2017 1542   K 3.6 12/28/2016 1359   CL 103 09/08/2017 1542   CO2 27 09/08/2017 1542   CO2 25 12/28/2016 1359   GLUCOSE 77 09/08/2017 1542   GLUCOSE 85 12/28/2016 1359   BUN 14 09/08/2017 1542   BUN 15.6 12/28/2016 1359   CREATININE 0.86 09/08/2017 1542   CREATININE 0.9 12/28/2016 1359   CALCIUM 9.4 09/08/2017 1542   CALCIUM 9.7 12/28/2016 1359   PROT 6.8 09/08/2017 1542   PROT 7.5 12/28/2016 1359   ALBUMIN 3.7 12/28/2016 1359   AST 13 09/08/2017 1542   AST 14 12/28/2016 1359   ALT 14 09/08/2017 1542   ALT 17 12/28/2016 1359   ALKPHOS 86 12/28/2016 1359   BILITOT 0.5 09/08/2017 1542   BILITOT 0.35 12/28/2016 1359   GFRNONAA 76 (L) 09/08/2013 1120   GFRNONAA 70 09/02/2013 1128   GFRAA 88 (L) 09/08/2013 1120   GFRAA 81 09/02/2013 1128    INo results found for: SPEP, UPEP  Lab Results  Component Value Date   WBC 6.6 12/28/2017   NEUTROABS 4.2 12/28/2017   HGB 13.9 09/08/2017   HCT 42.0 12/28/2017   MCV 95.4 12/28/2017   PLT 209 12/28/2017      Chemistry      Component Value Date/Time   NA 140 09/08/2017 1542   NA 139 12/28/2016 1359   K 4.2 09/08/2017 1542   K 3.6 12/28/2016 1359   CL 103 09/08/2017 1542   CO2 27 09/08/2017 1542   CO2 25 12/28/2016 1359   BUN 14 09/08/2017 1542   BUN 15.6 12/28/2016 1359   CREATININE 0.86 09/08/2017 1542   CREATININE 0.9 12/28/2016 1359      Component Value  Date/Time   CALCIUM 9.4 09/08/2017 1542   CALCIUM 9.7 12/28/2016 1359   ALKPHOS 86 12/28/2016 1359   AST 13 09/08/2017 1542   AST 14 12/28/2016 1359  ALT 14 09/08/2017 1542   ALT 17 12/28/2016 1359   BILITOT 0.5 09/08/2017 1542   BILITOT 0.35 12/28/2016 1359       No results found for: LABCA2  No components found for: LABCA125  No results for input(s): INR in the last 168 hours.  Urinalysis    Component Value Date/Time   COLORURINE YELLOW 09/08/2017 1542   APPEARANCEUR CLOUDY (A) 09/08/2017 1542   LABSPEC 1.018 09/08/2017 1542   PHURINE 6.5 09/08/2017 1542   GLUCOSEU NEGATIVE 09/08/2017 1542   GLUCOSEU NEGATIVE 09/06/2016 1225   HGBUR NEGATIVE 09/08/2017 1542   BILIRUBINUR NEGATIVE 09/06/2016 1225   KETONESUR NEGATIVE 09/08/2017 1542   PROTEINUR NEGATIVE 09/08/2017 1542   UROBILINOGEN 0.2 09/06/2016 1225   NITRITE NEGATIVE 09/08/2017 1542   LEUKOCYTESUR NEGATIVE 09/08/2017 1542    STUDIES: Since her last visit, she underwent diagnostic bilateral mammography with CAD and tomography on 11/10/[facility] showing: breast density category B. There was no evidence of malignancy.   ASSESSMENT: 62 y.o. [place], [place] woman status post right breast upper outer quadrant biopsy 11/12/2014 for a clinical T1c N0, stage IA invasive ductal carcinoma, triple negative, with an MIB-1 of 19%  (1) neoadjuvant chemotherapy consisting of cyclophosphamide and docetaxel 4 given every 21 days with Neulasta support, first dose 12/02/2014, completed 02/03/2015  (2) genetics testing through the [facility] gene panel at [facility] showed no deleterious mutations in ATM, BARD1, BRCA1, BRCA2, BRIP1, CDH1, CHEK2, MRE11A, MUTYH, NBN, NF1, PALB2, PTEN, RAD50, RAD51C, RAD51D, oe TP53.  (3) status post right lumpectomy and sentinel node sampling 03/02/2015 for a ypT1b ypN0 stage IA invasive ductal carcinoma, grade 1, with negative margins. Repeat HER-2 was again not  amplified.  (4) radiation completed August 2016  (5) continuing tobacco abuse. The patient has been strongly advised to quit smoking  PLAN: Kemauri is now just about 3 years out from definitive surgery for breast cancer with no evidence of disease recurrence.  This is very favorable.  Right now she is overwhelmed by a variety of problems.  We discussed antidepressants and we are going to try a very low dose of venlafaxine.  If it causes her any problems or if it does not work she will let us know.  We can always consider increasing the dose.  I suggest that she meet with 1 of our social workers today but she could not since her husband was waiting for her in the lobby and was in a hurry.  We will try to contact her by phone.  From a breast cancer point of view she is doing great and she will see me again in 1 year  She knows to call for any issues that may develop before the next visit.  Katie Jacobson, Valentino Hue, MD  12/28/17 2:17 PM Medical Oncology and Hematology [facility] [address] [place], [place] [address] Tel. [phone]    Fax. [phone]  This document serves as a record of services personally performed by Ruthann Cancer, MD. It was created on his behalf by Merideth Abbey, a trained medical scribe. The creation of this record is based on the scribe's personal observations and the provider's statements to them.   I have reviewed the above documentation for accuracy and completeness, and I agree with the above.

## 2017-12-28 ENCOUNTER — Other Ambulatory Visit: Payer: Self-pay | Admitting: *Deleted

## 2017-12-28 ENCOUNTER — Inpatient Hospital Stay: Payer: BLUE CROSS/BLUE SHIELD

## 2017-12-28 ENCOUNTER — Encounter: Payer: Self-pay | Admitting: General Practice

## 2017-12-28 ENCOUNTER — Inpatient Hospital Stay: Payer: BLUE CROSS/BLUE SHIELD | Attending: Oncology | Admitting: Oncology

## 2017-12-28 VITALS — BP 146/79 | HR 72 | Temp 98.2°F | Resp 18 | Ht 61.0 in | Wt 232.2 lb

## 2017-12-28 DIAGNOSIS — Z171 Estrogen receptor negative status [ER-]: Secondary | ICD-10-CM | POA: Diagnosis not present

## 2017-12-28 DIAGNOSIS — Z9221 Personal history of antineoplastic chemotherapy: Secondary | ICD-10-CM

## 2017-12-28 DIAGNOSIS — F329 Major depressive disorder, single episode, unspecified: Secondary | ICD-10-CM | POA: Insufficient documentation

## 2017-12-28 DIAGNOSIS — F17218 Nicotine dependence, cigarettes, with other nicotine-induced disorders: Secondary | ICD-10-CM | POA: Insufficient documentation

## 2017-12-28 DIAGNOSIS — C50411 Malignant neoplasm of upper-outer quadrant of right female breast: Secondary | ICD-10-CM

## 2017-12-28 DIAGNOSIS — Z923 Personal history of irradiation: Secondary | ICD-10-CM | POA: Diagnosis not present

## 2017-12-28 DIAGNOSIS — Z853 Personal history of malignant neoplasm of breast: Secondary | ICD-10-CM

## 2017-12-28 DIAGNOSIS — Z803 Family history of malignant neoplasm of breast: Secondary | ICD-10-CM

## 2017-12-28 LAB — COMPREHENSIVE METABOLIC PANEL
ALBUMIN: 3.4 g/dL — AB (ref 3.5–5.0)
ALT: 16 U/L (ref 0–55)
ANION GAP: 10 (ref 3–11)
AST: 14 U/L (ref 5–34)
Alkaline Phosphatase: 81 U/L (ref 40–150)
BILIRUBIN TOTAL: 0.4 mg/dL (ref 0.2–1.2)
BUN: 13 mg/dL (ref 7–26)
CO2: 25 mmol/L (ref 22–29)
Calcium: 9.4 mg/dL (ref 8.4–10.4)
Chloride: 103 mmol/L (ref 98–109)
Creatinine, Ser: 0.91 mg/dL (ref 0.60–1.10)
GFR calc non Af Amer: >60 mL/min (ref >60)
GLUCOSE: 81 mg/dL (ref 70–140)
POTASSIUM: 3.5 mmol/L (ref 3.5–5.1)
SODIUM: 138 mmol/L (ref 136–145)
TOTAL PROTEIN: 7.1 g/dL (ref 6.4–8.3)

## 2017-12-28 LAB — CBC WITH DIFFERENTIAL (CANCER CENTER ONLY)
BASOS ABS: 0 10*3/uL (ref 0.0–0.1)
Basophils Relative: 1 %
Eosinophils Absolute: 0 10*3/uL (ref 0.0–0.5)
Eosinophils Relative: 1 %
HEMATOCRIT: 42 % (ref 34.8–46.6)
HEMOGLOBIN: 14 g/dL (ref 11.6–15.9)
LYMPHS PCT: 28 %
Lymphs Abs: 1.8 10*3/uL (ref 0.9–3.3)
MCH: 31.7 pg (ref 25.1–34.0)
MCHC: 33.3 g/dL (ref 31.5–36.0)
MCV: 95.4 fL (ref 79.5–101.0)
MONO ABS: 0.5 10*3/uL (ref 0.1–0.9)
Monocytes Relative: 7 %
NEUTROS ABS: 4.2 10*3/uL (ref 1.5–6.5)
NEUTROS PCT: 63 %
Platelet Count: 209 10*3/uL (ref 145–400)
RBC: 4.4 MIL/uL (ref 3.70–5.45)
RDW: 15.8 % — AB (ref 11.2–14.5)
WBC Count: 6.6 10*3/uL (ref 3.9–10.3)

## 2017-12-28 MED ORDER — VENLAFAXINE HCL ER 37.5 MG PO CP24: 37.5000 mg | ORAL_CAPSULE | Freq: Every day | ORAL | 12 refills | Status: DC

## 2017-12-28 NOTE — Progress Notes (Signed)
[facility] CSW Progress Notes  CSW received referral from MD, requested to speak w patient as she is dealing w multiple stressors in addition to completion of cancer treatment.  CSW spoke w patient by phone who stated "I was just having a moment, I am really fine now."  CSW offered to meet w patient at [facility] and/or coordinate referrals for community counseling resource.  Transportation is difficult for patient so is limited in availability.  CSW offered [facility] [employer] resources but patient wants to come to [facility] to seek help.  Patient feels she is able to manage her emotions "just like I always do."  Was receptive to having CSW contact information and will recontact when transportation is easier to arrange.  Edwyna Shell, LCSW Clinical Social Worker Phone:  [phone]

## 2018-01-11 ENCOUNTER — Other Ambulatory Visit: Payer: Self-pay | Admitting: Cardiology

## 2018-03-07 ENCOUNTER — Other Ambulatory Visit: Payer: Self-pay | Admitting: Oncology

## 2018-05-01 ENCOUNTER — Emergency Department (HOSPITAL_BASED_OUTPATIENT_CLINIC_OR_DEPARTMENT_OTHER): Payer: BLUE CROSS/BLUE SHIELD

## 2018-05-01 ENCOUNTER — Encounter (HOSPITAL_BASED_OUTPATIENT_CLINIC_OR_DEPARTMENT_OTHER): Payer: Self-pay | Admitting: *Deleted

## 2018-05-01 ENCOUNTER — Other Ambulatory Visit: Payer: Self-pay

## 2018-05-01 ENCOUNTER — Emergency Department (HOSPITAL_BASED_OUTPATIENT_CLINIC_OR_DEPARTMENT_OTHER)
Admission: EM | Admit: 2018-05-01 | Discharge: 2018-05-01 | Disposition: A | Discharge status: Home / Self Care | Payer: BLUE CROSS/BLUE SHIELD | Attending: Emergency Medicine | Admitting: Emergency Medicine

## 2018-05-01 DIAGNOSIS — I10 Essential (primary) hypertension: Secondary | ICD-10-CM | POA: Insufficient documentation

## 2018-05-01 DIAGNOSIS — Z87891 Personal history of nicotine dependence: Secondary | ICD-10-CM | POA: Diagnosis not present

## 2018-05-01 DIAGNOSIS — Z79899 Other long term (current) drug therapy: Secondary | ICD-10-CM | POA: Diagnosis not present

## 2018-05-01 DIAGNOSIS — J45909 Unspecified asthma, uncomplicated: Secondary | ICD-10-CM | POA: Diagnosis not present

## 2018-05-01 DIAGNOSIS — Z7982 Long term (current) use of aspirin: Secondary | ICD-10-CM | POA: Diagnosis not present

## 2018-05-01 DIAGNOSIS — J069 Acute upper respiratory infection, unspecified: Secondary | ICD-10-CM | POA: Insufficient documentation

## 2018-05-01 DIAGNOSIS — R05 Cough: Secondary | ICD-10-CM | POA: Diagnosis present

## 2018-05-01 DIAGNOSIS — B9789 Other viral agents as the cause of diseases classified elsewhere: Secondary | ICD-10-CM

## 2018-05-01 DIAGNOSIS — Z853 Personal history of malignant neoplasm of breast: Secondary | ICD-10-CM | POA: Diagnosis not present

## 2018-05-01 MED ORDER — DEXAMETHASONE 1 MG/ML PO CONC
10.0000 mg | Freq: Once | ORAL | Status: DC
  Filled 2018-05-01: qty 10

## 2018-05-01 MED ORDER — PROMETHAZINE-DM 6.25-15 MG/5ML PO SYRP: 5.0000 mL | ORAL_SOLUTION | Freq: Four times a day (QID) | ORAL | 0 refills | Status: DC | PRN

## 2018-05-01 MED ORDER — BENZONATATE 100 MG PO CAPS: 100.0000 mg | ORAL_CAPSULE | Freq: Three times a day (TID) | ORAL | 0 refills | Status: DC

## 2018-05-01 MED ORDER — DEXAMETHASONE 10 MG/ML FOR PEDIATRIC ORAL USE
10.0000 mg | Freq: Once | INTRAMUSCULAR | Status: AC
  Administered 2018-05-01: 10 mg via ORAL

## 2018-05-01 MED ORDER — ALBUTEROL SULFATE HFA 108 (90 BASE) MCG/ACT IN AERS
4.0000 | INHALATION_SPRAY | Freq: Once | RESPIRATORY_TRACT | Status: AC
  Administered 2018-05-01: 4 via RESPIRATORY_TRACT
  Filled 2018-05-01: qty 6.7

## 2018-05-01 MED ORDER — DEXAMETHASONE 10 MG/ML FOR PEDIATRIC ORAL USE
INTRAMUSCULAR | Status: AC
  Filled 2018-05-01: qty 1

## 2018-05-01 MED ORDER — HYDROCOD POLST-CPM POLST ER 10-8 MG/5ML PO SUER
5.0000 mL | Freq: Once | ORAL | Status: AC
  Administered 2018-05-01: 5 mL via ORAL
  Filled 2018-05-01: qty 5

## 2018-05-01 NOTE — ED Provider Notes (Signed)
[facility] EMERGENCY DEPARTMENT Provider Note   CSN: [id number] Arrival date & time: 05/01/18  1951     History   Chief Complaint Chief Complaint  Patient presents with  . URI    HPI Katie Jacobson is a 62 y.o. female.  HPI  presents to ED with complaint of nasal congestion, cough, diarrhea, sob onset this morning. States symptoms started with diarrhea which now improved.  States now is just having nasal congestion and severe cough.  She states when she is coughing she cannot catch her breath.  Denies any chest pain.  Denies any fever or chills.  Tried Robitussin which has not helped.  She also had an inhaler at home that expired in 2016, so she did not use it.  Denies any nausea or vomiting.  Denies any abdominal pain.  No other complaints.   Past Medical History:  Diagnosis Date  . Breast cancer of upper-outer quadrant of right female breast ([facility]) 11/14/2014   finished chemo / over year  . Breast cancer, right breast ([facility])   . History of chemotherapy    finished chemo 02/03/2015  . History of kidney stones   . History of seizures    as a child - unknown cause - states was never on anticonvulsants  . Hypertension    states under control with meds., has been on med. x "years"  . Mitral valve prolapse 1990  . Osteoporosis   . Personal history of chemotherapy   . Personal history of radiation therapy   . Seasonal allergies     Patient Active Problem List   Diagnosis Date Noted  . Heart murmur 11/13/2017  . Genetic testing 01/25/2016  . Microscopic hematuria 09/06/2015  . Vaginal dryness 12/09/2014  . Family history of breast cancer   . Malignant neoplasm of upper-outer quadrant of right breast in female, estrogen receptor negative ([facility]) 11/14/2014  . Intermittent palpitations   . Obesity (BMI 35.0-39.9 without comorbidity)   . Hyperlipidemia LDL goal <100 07/07/2011  . General medical examination 07/07/2011  . History of partial seizures 04/01/2011  .  Asthma 04/01/2011  . Mitral valve prolapse 04/01/2011  . Hot flashes 04/01/2011  . Tobacco abuse 04/01/2011  . Essential hypertension 04/01/2011    Past Surgical History:  Procedure Laterality Date  . ABDOMINAL HYSTERECTOMY  1998   partial  . APPENDECTOMY  1976  . BREAST LUMPECTOMY Right 02/2015  . COLONOSCOPY    . KNEE ARTHROSCOPY Left 03/26/2008  . PLANTAR'S WART EXCISION Left    x 2  . PORT-A-CATH REMOVAL Left 04/06/2015   Procedure: REMOVAL PORT-A-CATH;  Surgeon: Rolm Bookbinder, MD;  Location: [facility];  Service: General;  Laterality: Left;  . PORTACATH PLACEMENT N/A 11/27/2014   Procedure: INSERTION PORT-A-CATH;  Surgeon: Rolm Bookbinder, MD;  Location: [facility];  Service: General;  Laterality: N/A;  . RADIOACTIVE SEED GUIDED PARTIAL MASTECTOMY WITH AXILLARY SENTINEL LYMPH NODE BIOPSY Right 03/02/2015   Procedure: RADIOACTIVE SEED GUIDED RIGHT PARTIAL MASTECTOMY WITH RIGHT AXILLARY SENTINEL LYMPH NODE BIOPSY;  Surgeon: Rolm Bookbinder, MD;  Location: [facility];  Service: General;  Laterality: Right;  . SALIVARY STONE REMOVAL Right 1972  . TONSILLECTOMY  1970s  . TUBAL LIGATION  1996     OB History    Gravida  2   Para  2   Term  2   Preterm      AB  0   Living  SAB      TAB      Ectopic      Multiple      Live Births               Home Medications    Prior to Admission medications   Medication Sig Start Date End Date Taking? Authorizing Provider  albuterol (PROVENTIL HFA;VENTOLIN HFA) 108 (90 BASE) MCG/ACT inhaler Inhale into the lungs every 6 (six) hours as needed for wheezing or shortness of breath.    [provider]  amoxicillin (AMOXIL) 500 MG capsule Take 2 grams (4 pills) prior to dental procedure 08/08/16   Copland, Gay Filler, MD  aspirin 81 MG tablet Take 1 tablet (81 mg total) by mouth daily. 04/21/15   Magrinat, Virgie Dad, MD  chlorthalidone (HYGROTON) 25 MG tablet TAKE 1  TABLET BY MOUTH EVERY OTHER DAY 01/11/18   Leonie Man, MD  enalapril (VASOTEC) 20 MG tablet TAKE 1 TABLET BY MOUTH EVERY NIGHT AT BEDTIME 01/11/18   Leonie Man, MD  fluticasone [facility]) 50 MCG/ACT nasal spray Place 1 spray into both nostrils as needed. 07/12/14   [provider]  Magnesium 400 MG TABS Take 400 mg by mouth daily as needed. 11/13/17   Leonie Man, MD  metoprolol tartrate (LOPRESSOR) 25 MG tablet TAKE 1 TABLET(25 MG) BY MOUTH TWICE DAILY 01/11/18   Leonie Man, MD  Potassium 99 MG TABS Take 1 tablet (99 mg total) by mouth daily as needed. 11/13/17   Leonie Man, MD  venlafaxine XR (EFFEXOR-XR) 37.5 MG 24 hr capsule TAKE 1 CAPSULE (37.5 MG TOTAL) BY MOUTH DAILY WITH BREAKFAST. 03/07/18   Magrinat, Virgie Dad, MD    Family History Family History  Problem Relation Age of Onset  . Heart disease Mother   . Stroke Mother   . Hypertension Mother   . Diabetes Mother   . Diabetes Sister   . Kidney disease Brother   . Mental retardation Brother   . Diabetes Sister   . Heart attack Father   . Breast cancer Cousin        deceased 55    Social History Social History   Tobacco Use  . Smoking status: Former Smoker    Packs/day: 0.50    Years: 43.00    Pack years: 21.50    Types: Cigarettes  . Smokeless tobacco: Former Systems developer    Quit date: 09/09/2016  . Tobacco comment: 10 cig./day  Substance Use Topics  . Alcohol use: Yes    Alcohol/week: 0.6 - 2.4 oz    Types: 1 - 4 Standard drinks or equivalent per week  . Drug use: No     Allergies   Sulfa antibiotics   Review of Systems Review of Systems  Constitutional: Negative for chills and fever.  HENT: Positive for congestion, postnasal drip and rhinorrhea. Negative for sore throat.   Respiratory: Positive for cough and shortness of breath. Negative for chest tightness.   Cardiovascular: Negative for chest pain, palpitations and leg swelling.  Gastrointestinal: Positive for diarrhea. Negative for  abdominal pain, nausea and vomiting.  Genitourinary: Negative for dysuria, flank pain and pelvic pain.  Musculoskeletal: Negative for arthralgias, myalgias, neck pain and neck stiffness.  Skin: Negative for rash.  Neurological: Positive for headaches. Negative for dizziness and weakness.  All other systems reviewed and are negative.    Physical Exam Updated Vital Signs BP (!) 150/79 (BP Location: Left Arm)   Pulse 79   Temp  98.5 F (36.9 C) (Oral)   Resp (!) 32   Ht 5\' 1"  (1.549 m)   Wt 104.3 kg (230 lb)   LMP 10/10/1996   SpO2 99%   BMI 43.46 kg/m   Physical Exam  Constitutional: She is oriented to person, place, and time. She appears well-developed and well-nourished. No distress.  HENT:  Head: Normocephalic.  Nasal congestion present.  Oropharynx normal.  TMs are normal bilaterally.  Eyes: Conjunctivae are normal.  Neck: Neck supple.  Cardiovascular: Normal rate, regular rhythm and normal heart sounds.  Pulmonary/Chest: Effort normal and breath sounds normal. She has no wheezes. She has no rales.  Tachypneic, with frequent coughing spell that is coarse and croupy.  Abdominal: Soft. Bowel sounds are normal. She exhibits no distension. There is no tenderness. There is no rebound.  Musculoskeletal: She exhibits no edema.  Neurological: She is alert and oriented to person, place, and time.  Skin: Skin is warm and dry.  Psychiatric: She has a normal mood and affect. Her behavior is normal.  Nursing note and vitals reviewed.    ED Treatments / Results  Labs (all labs ordered are listed, but only abnormal results are displayed) Labs Reviewed - No data to display  EKG None  Radiology Dg Chest 2 View  Result Date: 05/01/2018 CLINICAL DATA:  Shortness of breath, nonproductive cough. EXAM: CHEST - 2 VIEW COMPARISON:  November 27, 2014. FINDINGS: The heart size and mediastinal contours are within normal limits. Both lungs are clear. No pneumothorax or pleural effusion is  noted. The visualized skeletal structures are unremarkable. IMPRESSION: No active cardiopulmonary disease. Electronically Signed   By: Marijo Conception, M.D.   On: 05/01/2018 20:54    Procedures Procedures (including critical care time)  Medications Ordered in ED Medications  albuterol (PROVENTIL HFA;VENTOLIN HFA) 108 (90 Base) MCG/ACT inhaler 4 puff (4 puffs Inhalation Given 05/01/18 2025)  chlorpheniramine-HYDROcodone (TUSSIONEX) 10-8 MG/5ML suspension 5 mL (5 mLs Oral Given 05/01/18 2049)  dexamethasone (DECADRON) 10 MG/ML injection for Pediatric ORAL use 10 mg (10 mg Oral Given 05/01/18 2054)     Initial Impression / Assessment and Plan / ED Course  I have reviewed the triage vital signs and the nursing notes.  Pertinent labs & imaging results that were available during my care of the patient were reviewed by me and considered in my medical decision making (see chart for details).     She did emergency department with cough, congestion, diarrhea onset today.  Symptoms seem to be viral.  Her lungs are clear, however she keeps having frequent episodes of spastic cough attacks that sounds croupy and barky.  Will get a chest x-ray to rule out pneumonia.  Will try inhaler, will give Decadron, and will give Tussionex.   Chest x-ray is negative.  Patient feels much better.  Her she says her cough has improved.  Her breathing has improved as well.  She is no longer tachypneic on my evaluation.  Again she is not complaining of any chest pain, she clearly has nasal congestion and postnasal drainage with cough.  Her symptoms are viral.  We will treat her with cough suppressants, and an inhaler at home.  Follow-up with PCP in the next 2 days  Vitals:   05/01/18 2000 05/01/18 2007 05/01/18 2034 05/01/18 2143  BP: (!) 150/79   140/62  Pulse: 79   78  Resp: (!) 32   20  Temp: 98.5 F (36.9 C)     TempSrc: Oral  SpO2: 99% 98% 99% 99%  Weight:      Height:         Final Clinical  Impressions(s) / ED Diagnoses   Final diagnoses:  Viral URI with cough    ED Discharge Orders        Ordered    promethazine-dextromethorphan (PROMETHAZINE-DM) 6.25-15 MG/5ML syrup  4 times daily PRN,   Status:  Discontinued     05/01/18 2141    benzonatate (TESSALON) 100 MG capsule  Every 8 hours,   Status:  Discontinued     05/01/18 2141    benzonatate (TESSALON) 100 MG capsule  Every 8 hours     05/01/18 2144    promethazine-dextromethorphan (PROMETHAZINE-DM) 6.25-15 MG/5ML syrup  4 times daily PRN     05/01/18 2144       Jeannett Senior, PA-C 05/01/18 2217    Margette Fast, MD 05/02/18 1115

## 2018-05-01 NOTE — ED Triage Notes (Signed)
Pt reports cough, nasal congestion that started last night. Reports non-productive cough.

## 2018-05-01 NOTE — ED Notes (Signed)
Pt reports abd pain, diarrhea, N/V yesterday that has since resolved.

## 2018-05-01 NOTE — Discharge Instructions (Signed)
Use inhaler 2 puffs every 3-4 hours.  Take cough suppressants as prescribed.  Try intranasal saline for nasal congestion.  Follow-up with family doctor if not improving.  Return if worsening.

## 2018-06-29 ENCOUNTER — Other Ambulatory Visit: Payer: Self-pay | Admitting: *Deleted

## 2018-07-03 ENCOUNTER — Telehealth: Payer: Self-pay | Admitting: *Deleted

## 2018-07-03 DIAGNOSIS — I890 Lymphedema, not elsewhere classified: Secondary | ICD-10-CM

## 2018-07-03 DIAGNOSIS — C50411 Malignant neoplasm of upper-outer quadrant of right female breast: Secondary | ICD-10-CM

## 2018-07-03 DIAGNOSIS — Z171 Estrogen receptor negative status [ER-]: Secondary | ICD-10-CM

## 2018-07-03 NOTE — Telephone Encounter (Signed)
His RN spoke with pt per her call stating continued issues with right arm - with decreased function " like in the shoulder " some swelling and discomfort.  This RN discussed above and probable need to see the PT for lymphedema and post lymph node dissection issues.  Katie Jacobson states she has moved to the [place] area and was asking if she could obtain therapy at the [facility] in [place].  This RN inquired per PT- and was informed presently no lymphedema accredited PT at [facility] facility- pt would need to see the PT in [facility]- and once lymphedema issue resolved other PT may be obtained at the [facility] location.  This RN informed the patient of above and she is in agreement.  Order placed.

## 2018-07-05 ENCOUNTER — Encounter: Payer: Self-pay | Admitting: Rehabilitation

## 2018-07-05 ENCOUNTER — Other Ambulatory Visit: Payer: Self-pay

## 2018-07-05 ENCOUNTER — Ambulatory Visit: Payer: BLUE CROSS/BLUE SHIELD | Attending: Oncology | Admitting: Rehabilitation

## 2018-07-05 DIAGNOSIS — R293 Abnormal posture: Secondary | ICD-10-CM | POA: Diagnosis present

## 2018-07-05 DIAGNOSIS — I890 Lymphedema, not elsewhere classified: Secondary | ICD-10-CM | POA: Diagnosis present

## 2018-07-05 DIAGNOSIS — M25611 Stiffness of right shoulder, not elsewhere classified: Secondary | ICD-10-CM | POA: Insufficient documentation

## 2018-07-05 DIAGNOSIS — M25511 Pain in right shoulder: Secondary | ICD-10-CM | POA: Insufficient documentation

## 2018-07-05 DIAGNOSIS — G8929 Other chronic pain: Secondary | ICD-10-CM | POA: Insufficient documentation

## 2018-07-05 NOTE — Therapy (Signed)
[place], [place], [address] Phone: [phone]   Fax:  [phone]  Physical Therapy Evaluation  Patient Details  Name: Katie Jacobson MRN: [id number] Date of Birth: 1956-07-27 Referring Provider (PT): Dr. Jana Hakim   Encounter Date: 07/05/2018  PT End of Session - 07/05/18 1526    Visit Number  1    Number of Visits  9    Date for PT Re-Evaluation  08/02/18    PT Start Time  2119    PT Stop Time  1520    PT Time Calculation (min)  47 min    Activity Tolerance  Patient tolerated treatment well    Behavior During Therapy  [facility] for tasks assessed/performed       Past Medical History:  Diagnosis Date  . Breast cancer of upper-outer quadrant of right female breast ([place]) 11/14/2014   finished chemo / over year  . Breast cancer, right breast ([place])   . History of chemotherapy    finished chemo 02/03/2015  . History of kidney stones   . History of seizures    as a child - unknown cause - states was never on anticonvulsants  . Hypertension    states under control with meds., has been on med. x "years"  . Mitral valve prolapse 1990  . Osteoporosis   . Personal history of chemotherapy   . Personal history of radiation therapy   . Seasonal allergies     Past Surgical History:  Procedure Laterality Date  . ABDOMINAL HYSTERECTOMY  1998   partial  . APPENDECTOMY  1976  . BREAST LUMPECTOMY Right 02/2015  . COLONOSCOPY    . KNEE ARTHROSCOPY Left 03/26/2008  . PLANTAR'S WART EXCISION Left    x 2  . PORT-A-CATH REMOVAL Left 04/06/2015   Procedure: REMOVAL PORT-A-CATH;  Surgeon: Rolm Bookbinder, MD;  Location: [place];  Service: General;  Laterality: Left;  . PORTACATH PLACEMENT N/A 11/27/2014   Procedure: INSERTION PORT-A-CATH;  Surgeon: Rolm Bookbinder, MD;  Location: [place];  Service: General;  Laterality: N/A;  . RADIOACTIVE SEED GUIDED PARTIAL MASTECTOMY WITH  AXILLARY SENTINEL LYMPH NODE BIOPSY Right 03/02/2015   Procedure: RADIOACTIVE SEED GUIDED RIGHT PARTIAL MASTECTOMY WITH RIGHT AXILLARY SENTINEL LYMPH NODE BIOPSY;  Surgeon: Rolm Bookbinder, MD;  Location: [place];  Service: General;  Laterality: Right;  . SALIVARY STONE REMOVAL Right 1972  . TONSILLECTOMY  1970s  . TUBAL LIGATION  1996    There were no vitals filed for this visit.   Subjective Assessment - 07/05/18 1442    Subjective  I think the ROM and swelling have returned in the Rt arm.  Can't find the compression garment.  Top of the shoulder pain and under arm tightness    Pertinent History  triple negative breast cancer s/p Rt lumpectomy in 2016 with neo-adjuvant chemotherapy and adjuvant radiation with history of treatment here.  osteoporosis     Limitations  Lifting    Patient Stated Goals  improve raising the arm overhead, hooking the bra, decrease the shoulder pain    Currently in Pain?  Yes    Pain Score  4     Pain Location  Shoulder    Pain Orientation  Right    Pain Descriptors / Indicators  Aching    Pain Type  Surgical pain;Acute pain    Pain Onset  More than a month ago    Pain Frequency  Constant  Aggravating Factors   moving    Pain Relieving Factors  rest, and tylenol         [facility] PT Assessment - 07/05/18 0001      Assessment   Medical Diagnosis  Rt breast cancer triple negative    Referring Provider (PT)  Dr. Jana Hakim    Onset Date/Surgical Date  03/02/15    Hand Dominance  Left    Next MD Visit  as needed    Prior Therapy  yes in 2017 for the same      Precautions   Precaution Comments  cancer, lymphedema      Restrictions   Weight Bearing Restrictions  No      Balance Screen   Has the patient fallen in the past 6 months  No    Has the patient had a decrease in activity level because of a fear of falling?   No    Is the patient reluctant to leave their home because of a fear of falling?   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, DJ business     Leisure  line dancing      Cognition   Overall Cognitive Status  Within Functional Limits for tasks assessed      Sensation   Additional Comments  numbness in tricep region       Coordination   Gross Motor Movements are Fluid and Coordinated  Yes      Posture/Postural Control   Posture/Postural Control  Postural limitations    Postural Limitations  Rounded Shoulders;Forward head;Increased thoracic kyphosis      ROM / Strength   AROM / PROM / Strength  AROM;PROM      AROM   AROM Assessment Site  Shoulder    Right/Left Shoulder  Right;Left    Right Shoulder Flexion  100 Degrees   " a rubberband"    Right Shoulder ABduction  65 Degrees    Right Shoulder External Rotation  60 Degrees   pain   Left Shoulder Flexion  160 Degrees    Left Shoulder ABduction  180 Degrees      PROM   PROM Assessment Site  Shoulder    Right/Left Shoulder  Right;Left    Right Shoulder Flexion  110 Degrees    Right Shoulder ABduction  60 Degrees    Right Shoulder Internal Rotation  --   to belly   Right Shoulder External Rotation  45 Degrees        LYMPHEDEMA/ONCOLOGY QUESTIONNAIRE - 07/05/18 1452      Type   Cancer Type  Right breast      Surgeries   Lumpectomy Date  03/02/15    Sentinel Lymph Node Biopsy Date  03/02/15    Number Lymph Nodes Removed  3   pt unsure     Date Lymphedema/Swelling Started   Date  03/10/18   started again     Treatment   Active Chemotherapy Treatment  No    Past Chemotherapy Treatment  Yes    Active Radiation Treatment  No    Past Radiation Treatment  Yes    Current Hormone Treatment  No  Past Hormone Therapy  No      What other symptoms do you have   Are you  Having Heaviness or Tightness  Yes    Are you having Pain  Yes      Lymphedema Assessments   Lymphedema Assessments  Upper extremities      Right Upper Extremity Lymphedema   15 cm Proximal to Olecranon Process  40.8 cm    10 cm Proximal to Olecranon Process  39.5 cm    Olecranon Process  33 cm    10 cm Proximal to Ulnar Styloid Process  24.8 cm    Just Proximal to Ulnar Styloid Process  18 cm    Across Hand at [employer]  19.3 cm    At [place] of 2nd Digit  6.2 cm      Left Upper Extremity Lymphedema   10 cm Proximal to Olecranon Process  38 cm    Olecranon Process  31.5 cm    10 cm Proximal to Ulnar Styloid Process  23.7 cm    Just Proximal to Ulnar Styloid Process  17.4 cm    Across Hand at [employer]  20.1 cm    At [place] of 2nd Digit  5.7 cm          Quick Dash - 07/05/18 0001    Open a tight or new jar  Severe difficulty    Do heavy household chores (wash walls, wash floors)  Severe difficulty    Carry a shopping bag or briefcase  Moderate difficulty    Wash your back  Severe difficulty    Use a knife to cut food  Mild difficulty    Recreational activities in which you take some force or impact through your arm, shoulder, or hand (golf, hammering, tennis)  Unable    During the past week, to what extent has your arm, shoulder or hand problem interfered with your normal social activities with family, friends, neighbors, or groups?  Quite a bit    During the past week, to what extent has your arm, shoulder or hand problem limited your work or other regular daily activities  Quite a bit    Arm, shoulder, or hand pain.  None    Tingling (pins and needles) in your arm, shoulder, or hand  Severe    Difficulty Sleeping  Severe difficulty    DASH Score  63.64 %        Objective measurements completed on examination: See above findings.       Pt given TG soft medium for Rt UE with some improved pain       PT Education - 07/05/18 1526    Education Details   POC    Person(s) Educated  Patient    Methods  Explanation    Comprehension  Verbalized understanding          PT Long Term Goals - 07/05/18 1646      PT LONG TERM GOAL #1   Title  Pt will reduce Rt UE measurements to 38cm 10cm proximal to the olecranon and to 27.8 at the olecranon    Time  4    Period  Weeks    Status  New    Target Date  08/02/18      PT LONG TERM GOAL #2   Title  Pt will obtain appropriate compression for the Rt UE to maintain size     Time  4    Period  Weeks  Status  New    Target Date  08/02/18      PT LONG TERM GOAL #3   Title  Pt will improve Rt shoulder ROM to at least flex: 145 and abd: 145 to improve mobility     Time  4    Period  Weeks    Status  New    Target Date  08/02/18      PT LONG TERM GOAL #4   Title  Pt will be ind with final HEP for continue lymphedema care and shoulder mobility     Time  4    Period  Weeks    Status  New    Target Date  08/02/18      PT LONG TERM GOAL #5   Title  Pt will decrease resting shoulder pain to 0/10     Time  4    Period  Weeks    Status  New    Target Date  08/02/18             Plan - 07/05/18 1527    Clinical Impression Statement  Katie Jacobson returns today after being here in 2016 for new onset Rt shoulder pain and increased swelling.  Her measurements are almost 4cm larger in the upper arm and olecranon but remain about the same in the lower arm.  Her Lt UE is also larger in the upper arm but only by about 2 cm.  She got a compression sleeve when here last time but is unable to find it.  Also tried her MLD but was unable to remember how to do it.  Her shoulder ROM is significantly limited and painful actively and passively and does report some pulling into the chest with flexion and abduction.  She reports that her housework is now starting to be limited    History and Personal Factors relevant to plan of care:  triple negative breast cancer s/p Rt lumpectomy in 2016 with neo-adjuvant  chemotherapy and adjuvant radiation with history of treatment here. osteoporosis     Clinical Presentation  Evolving    Clinical Presentation due to:  new onset of pain and return of swelling    Clinical Decision Making  Moderate    Rehab Potential  Good    Clinical Impairments Affecting Rehab Potential  pain level     PT Frequency  2x / week    PT Duration  4 weeks    PT Treatment/Interventions  ADLs/Self Care Home Management;Therapeutic exercise;Patient/family education;Passive range of motion;Manual lymph drainage;Manual techniques;Compression bandaging;Taping;Electrical Stimulation    PT Next Visit Plan  begin Rt UE MLD, reteaching pt for self MLD, did she find her compression garment? compression bandaging? Rt shoulder AAROM/PROM, tens for pain relief?, STM work into Rt UT/periscapular muscles, taping?    Recommended Other Services  pt will need referral for compression custom made if not able to find hers    Consulted and Agree with Plan of Care  Patient       Patient will benefit from skilled therapeutic intervention in order to improve the following deficits and impairments:  Pain, Hypermobility, Decreased range of motion, Decreased strength, Impaired UE functional use  Visit Diagnosis: Chronic right shoulder pain  Abnormal posture  Stiffness of right shoulder, not elsewhere classified  Lymphedema, not elsewhere classified     Problem List Patient Active Problem List   Diagnosis Date Noted  . Heart murmur 11/13/2017  . Genetic testing 01/25/2016  . Microscopic hematuria 09/06/2015  . Vaginal  dryness 12/09/2014  . Family history of breast cancer   . Malignant neoplasm of upper-outer quadrant of right breast in female, estrogen receptor negative ([place]) 11/14/2014  . Intermittent palpitations   . Obesity (BMI 35.0-39.9 without comorbidity)   . Hyperlipidemia LDL goal <100 07/07/2011  . General medical examination 07/07/2011  . History of partial seizures 04/01/2011  .  Asthma 04/01/2011  . Mitral valve prolapse 04/01/2011  . Hot flashes 04/01/2011  . Tobacco abuse 04/01/2011  . Essential hypertension 04/01/2011    Katie Jacobson, PT 07/05/2018, 5:05 PM  [place], [place], [address] Phone: [phone]   Fax:  [phone]  Name: Katie Jacobson MRN: [id number] Date of Birth: 11-03-1955

## 2018-07-05 NOTE — Patient Instructions (Signed)
Look for her current compression garment

## 2018-07-09 ENCOUNTER — Encounter: Payer: Self-pay | Admitting: Rehabilitation

## 2018-07-09 ENCOUNTER — Ambulatory Visit: Payer: BLUE CROSS/BLUE SHIELD | Admitting: Rehabilitation

## 2018-07-09 DIAGNOSIS — G8929 Other chronic pain: Secondary | ICD-10-CM

## 2018-07-09 DIAGNOSIS — M25511 Pain in right shoulder: Principal | ICD-10-CM

## 2018-07-09 DIAGNOSIS — I890 Lymphedema, not elsewhere classified: Secondary | ICD-10-CM

## 2018-07-09 DIAGNOSIS — M25611 Stiffness of right shoulder, not elsewhere classified: Secondary | ICD-10-CM

## 2018-07-09 DIAGNOSIS — R293 Abnormal posture: Secondary | ICD-10-CM

## 2018-07-09 NOTE — Therapy (Signed)
[place], [place], [address] Phone: [phone]   Fax:  [phone]  Physical Therapy Treatment  Patient Details  Name: Katie Jacobson MRN: [id number] Date of Birth: 1955-11-24 Referring Provider (PT): Dr. Jana Jacobson   Encounter Date: 07/09/2018  PT End of Session - 07/09/18 1659    Visit Number  2    Number of Visits  9    Date for PT Re-Evaluation  08/02/18    PT Start Time  1602    PT Stop Time  1649    PT Time Calculation (min)  47 min    Activity Tolerance  Patient tolerated treatment well    Behavior During Therapy  [facility] for tasks assessed/performed       Past Medical History:  Diagnosis Date  . Breast cancer of upper-outer quadrant of right female breast ([place]) 11/14/2014   finished chemo / over year  . Breast cancer, right breast ([place])   . History of chemotherapy    finished chemo 02/03/2015  . History of kidney stones   . History of seizures    as a child - unknown cause - states was never on anticonvulsants  . Hypertension    states under control with meds., has been on med. x "years"  . Mitral valve prolapse 1990  . Osteoporosis   . Personal history of chemotherapy   . Personal history of radiation therapy   . Seasonal allergies     Past Surgical History:  Procedure Laterality Date  . ABDOMINAL HYSTERECTOMY  1998   partial  . APPENDECTOMY  1976  . BREAST LUMPECTOMY Right 02/2015  . COLONOSCOPY    . KNEE ARTHROSCOPY Left 03/26/2008  . PLANTAR'S WART EXCISION Left    x 2  . PORT-A-CATH REMOVAL Left 04/06/2015   Procedure: REMOVAL PORT-A-CATH;  Surgeon: Rolm Bookbinder, MD;  Location: [place];  Service: General;  Laterality: Left;  . PORTACATH PLACEMENT N/A 11/27/2014   Procedure: INSERTION PORT-A-CATH;  Surgeon: Rolm Bookbinder, MD;  Location: [place];  Service: General;  Laterality: N/A;  . RADIOACTIVE SEED GUIDED PARTIAL MASTECTOMY WITH  AXILLARY SENTINEL LYMPH NODE BIOPSY Right 03/02/2015   Procedure: RADIOACTIVE SEED GUIDED RIGHT PARTIAL MASTECTOMY WITH RIGHT AXILLARY SENTINEL LYMPH NODE BIOPSY;  Surgeon: Rolm Bookbinder, MD;  Location: [place];  Service: General;  Laterality: Right;  . SALIVARY STONE REMOVAL Right 1972  . TONSILLECTOMY  1970s  . TUBAL LIGATION  1996    There were no vitals filed for this visit.  Subjective Assessment - 07/09/18 1601    Subjective  i couldn't find my compression garment and the place where I got it is out of business. I was vacuuming today.     Pertinent History  triple negative breast cancer s/p Rt lumpectomy in 2016 with neo-adjuvant chemotherapy and adjuvant radiation with history of treatment here.  osteoporosis     Patient Stated Goals  improve raising the arm overhead, hooking the bra, decrease the shoulder pain    Currently in Pain?  Yes    Pain Score  5     Pain Location  Arm    Pain Orientation  Right    Pain Descriptors / Indicators  Aching    Pain Type  Surgical pain    Pain Onset  More than a month ago    Pain Frequency  Constant    Aggravating Factors   moving it    Pain Relieving Factors  rest and tylenol                       [facility] Adult PT Treatment/Exercise - 07/09/18 0001      Manual Therapy   Manual Therapy  Manual Lymphatic Drainage (MLD);Passive ROM;Taping    Manual therapy comments  discussed treatment options.  bandaging vs velcro garment with patient leaning towards the velcro garments but not really wanting to do either.  Pt is also very interested in the pump    Manual Lymphatic Drainage (MLD)  shortened MLD session following handout instructions with patient supine following all cues.  pt wiith some knowledge of the prior sessions here.  see pt instructions.      Passive ROM  to the Rt shoulder to tolerance into flexion and abduction    Kinesiotex  Edema;Facilitate Muscle      Kinesiotix   Facilitate Muscle   one Y  strip with anchor on the back towards the posterior interaxillary pathway and with 1 tail applied to the anterior deltoid with arm in scaption and 1 tail along medial deltoid with arm in abduction to help with edema and shoulder support              PT Education - 07/09/18 1659    Education Details  self MLD review    Person(s) Educated  Patient    Methods  Explanation;Demonstration;Tactile cues;Verbal cues;Handout    Comprehension  Verbalized understanding;Returned demonstration          PT Long Term Goals - 07/05/18 1646      PT LONG TERM GOAL #1   Title  Pt will reduce Rt UE measurements to 38cm 10cm proximal to the olecranon and to 27.8 at the olecranon    Time  4    Period  Weeks    Status  New    Target Date  08/02/18      PT LONG TERM GOAL #2   Title  Pt will obtain appropriate compression for the Rt UE to maintain size     Time  4    Period  Weeks    Status  New    Target Date  08/02/18      PT LONG TERM GOAL #3   Title  Pt will improve Rt shoulder ROM to at least flex: 145 and abd: 145 to improve mobility     Time  4    Period  Weeks    Status  New    Target Date  08/02/18      PT LONG TERM GOAL #4   Title  Pt will be ind with final HEP for continue lymphedema care and shoulder mobility     Time  4    Period  Weeks    Status  New    Target Date  08/02/18      PT LONG TERM GOAL #5   Title  Pt will decrease resting shoulder pain to 0/10     Time  4    Period  Weeks    Status  New    Target Date  08/02/18            Plan - 07/09/18 1700    Clinical Impression Statement  Katie Jacobson is not interested in compression bandaging but does want to try and decrease the size of the arm before getting compression garments.  She is agreeable to trying a velcro garment and is also interested in the pump.  Pt  with some understanding of MLD from prior therapy here but did not remember the sequence. Reviewed this today.  Able to range the shoulder more than eval  date but still painful.     PT Frequency  2x / week    PT Duration  4 weeks    PT Treatment/Interventions  ADLs/Self Care Home Management;Therapeutic exercise;Patient/family education;Passive range of motion;Manual lymph drainage;Manual techniques;Compression bandaging;Taping;Electrical Stimulation    PT Next Visit Plan  how was taping? review self MLD one more time, did she find her compression garment? Rt shoulder AAROM/PROM, tens for pain relief?, STM work into Rt UT/periscapular muscles, taping?    Recommended Other Services  sent information for pump request as well as prescription for compression garments 07/09/18    Consulted and Agree with Plan of Care  Patient       Patient will benefit from skilled therapeutic intervention in order to improve the following deficits and impairments:  Pain, Hypermobility, Decreased range of motion, Decreased strength, Impaired UE functional use  Visit Diagnosis: Chronic right shoulder pain  Abnormal posture  Stiffness of right shoulder, not elsewhere classified  Lymphedema, not elsewhere classified     Problem List Patient Active Problem List   Diagnosis Date Noted  . Heart murmur 11/13/2017  . Genetic testing 01/25/2016  . Microscopic hematuria 09/06/2015  . Vaginal dryness 12/09/2014  . Family history of breast cancer   . Malignant neoplasm of upper-outer quadrant of right breast in female, estrogen receptor negative ([place]) 11/14/2014  . Intermittent palpitations   . Obesity (BMI 35.0-39.9 without comorbidity)   . Hyperlipidemia LDL goal <100 07/07/2011  . General medical examination 07/07/2011  . History of partial seizures 04/01/2011  . Asthma 04/01/2011  . Mitral valve prolapse 04/01/2011  . Hot flashes 04/01/2011  . Tobacco abuse 04/01/2011  . Essential hypertension 04/01/2011    Shan Levans, PT 07/09/2018, 5:07 PM  [place], [place],  [address] Phone: [phone]   Fax:  [phone]  Name: Katie Jacobson MRN: [id number] Date of Birth: 1956/05/12

## 2018-07-09 NOTE — Patient Instructions (Signed)
Deep Effective Breath   1.) Standing, sitting, or laying down, place both hands on the belly. Take a deep breath IN, expanding the belly; then breath OUT, contracting the belly. Repeat __5__ times. Do __2-3__ sessions per day and before your self massage.  http://gt2.exer.us/866   Copyright  VHI. All rights reserved.  Axilla to Axilla - Sweep   2.) On uninvolved side make 5 circles in the armpit, 3.)  Then pump _5__ times from involved armpit (Right) across chest to uninvolved armpit (Left), making a pathway.   Copyright  VHI. All rights reserved.  Axilla to Inguinal Nodes - Sweep   4.) On right side, make 5 circles at groin at panty line, 5.) then pump _5__ times from armpit along side of trunk to outer hip, making your other pathway.   Copyright  VHI. All rights reserved.  Arm Posterior: Elbow to Shoulder - Sweep   Pump _5__ times from back of elbow to top of shoulder. Then inner to outer upper arm _5_ times, then outer arm again _5_ times. Then back to the pathways _2-3_ times. Do _1__ time per day.  Copyright  VHI. All rights reserved.  ARM: [place] Wrist to Elbow - Sweep   Pump or stationary circles _5__ times from wrist to elbow making sure to do both sides of the forearm. Then retrace your steps to the outer arm, and the pathways _2-3_ times each. Do _1__ time per day.  Copyright  VHI. All rights reserved.  ARM: Dorsum of Hand to Shoulder - Sweep   Pump or stationary circles _5__ times on back of hand including knuckle spaces and individual fingers if needed working up towards the wrist, then retrace all your steps working back up the forearm, doing both sides; upper outer arm and back to your pathways _2-3_ times each. Then do 5 circles again at uninvolved armpit and involved groin where you started! Good job!! Do __1_ time per day.  Copyright  VHI. All rights reserved.

## 2018-07-17 ENCOUNTER — Ambulatory Visit: Payer: BLUE CROSS/BLUE SHIELD | Admitting: Physical Therapy

## 2018-07-17 ENCOUNTER — Ambulatory Visit: Payer: BLUE CROSS/BLUE SHIELD | Attending: Oncology | Admitting: Physical Therapy

## 2018-07-17 ENCOUNTER — Encounter: Payer: Self-pay | Admitting: Physical Therapy

## 2018-07-17 DIAGNOSIS — R293 Abnormal posture: Secondary | ICD-10-CM | POA: Diagnosis present

## 2018-07-17 DIAGNOSIS — I890 Lymphedema, not elsewhere classified: Secondary | ICD-10-CM | POA: Diagnosis present

## 2018-07-17 DIAGNOSIS — M25611 Stiffness of right shoulder, not elsewhere classified: Secondary | ICD-10-CM

## 2018-07-17 DIAGNOSIS — M25511 Pain in right shoulder: Secondary | ICD-10-CM | POA: Diagnosis present

## 2018-07-17 DIAGNOSIS — G8929 Other chronic pain: Secondary | ICD-10-CM | POA: Insufficient documentation

## 2018-07-18 NOTE — Therapy (Signed)
[place], [place], [address] Phone: [phone]   Fax:  [phone]  Physical Therapy Treatment  Patient Details  Name: Katie Jacobson MRN: [id number] Date of Birth: 10/08/1956 Referring Provider (PT): Dr. Jana Hakim   Encounter Date: 07/17/2018    Past Medical History:  Diagnosis Date  . Breast cancer of upper-outer quadrant of right female breast ([place]) 11/14/2014   finished chemo / over year  . Breast cancer, right breast ([place])   . History of chemotherapy    finished chemo 02/03/2015  . History of kidney stones   . History of seizures    as a child - unknown cause - states was never on anticonvulsants  . Hypertension    states under control with meds., has been on med. x "years"  . Mitral valve prolapse 1990  . Osteoporosis   . Personal history of chemotherapy   . Personal history of radiation therapy   . Seasonal allergies     Past Surgical History:  Procedure Laterality Date  . ABDOMINAL HYSTERECTOMY  1998   partial  . APPENDECTOMY  1976  . BREAST LUMPECTOMY Right 02/2015  . COLONOSCOPY    . KNEE ARTHROSCOPY Left 03/26/2008  . PLANTAR'S WART EXCISION Left    x 2  . PORT-A-CATH REMOVAL Left 04/06/2015   Procedure: REMOVAL PORT-A-CATH;  Surgeon: Rolm Bookbinder, MD;  Location: [facility];  Service: General;  Laterality: Left;  . PORTACATH PLACEMENT N/A 11/27/2014   Procedure: INSERTION PORT-A-CATH;  Surgeon: Rolm Bookbinder, MD;  Location: [facility];  Service: General;  Laterality: N/A;  . RADIOACTIVE SEED GUIDED PARTIAL MASTECTOMY WITH AXILLARY SENTINEL LYMPH NODE BIOPSY Right 03/02/2015   Procedure: RADIOACTIVE SEED GUIDED RIGHT PARTIAL MASTECTOMY WITH RIGHT AXILLARY SENTINEL LYMPH NODE BIOPSY;  Surgeon: Rolm Bookbinder, MD;  Location: [facility];  Service: General;  Laterality: Right;  . SALIVARY STONE REMOVAL Right 1972  . TONSILLECTOMY   1970s  . TUBAL LIGATION  1996    There were no vitals filed for this visit.  Subjective Assessment - 07/17/18 1527    Subjective  Pt states she has been trying to do her manual lymph drainage but sheca't reach it because of the shoulder pain.     Pertinent History  triple negative breast cancer s/p Rt lumpectomy in 2016 with neo-adjuvant chemotherapy and adjuvant radiation with history of treatment here.  osteoporosis     Patient Stated Goals  improve raising the arm overhead, hooking the bra, decrease the shoulder pain    Currently in Pain?  Yes    Pain Score  5     Pain Location  Shoulder    Pain Orientation  Right    Pain Descriptors / Indicators  Aching    Pain Type  Chronic pain    Pain Onset  More than a month ago                       [facility] Adult PT Treatment/Exercise - 07/18/18 0001      Manual Therapy   Manual therapy comments  tg soft with deep fold at the top     Manual Lymphatic Drainage (MLD)  husband her to learn technique for MLD sequence; short neck, superficial and deep abdominals, left axillary nodes and anterior interaxillary anastamosis, right inguinal nodes and  right axillo-inguinal anastamosis, right upper arm to hand with return along pathways.  then to left sidelying for posterior interaxillary  anastamosis, Huband instructed with hand over hand technique and verbal instrtuction and feedback.  issued Celine Ahr training DVD to borrow for practice at home     Kinesiotex  Edema;Facilitate Muscle      Kinesiotix   Edema  fan shape from right shoulder to mid back    skinkote applied before Ktape    Facilitate Muscle   Y strip around top of shoulder              PT Education - 07/18/18 0842    Education Details  MLD    Person(s) Educated  Spouse    Methods  Explanation;Demonstration;Verbal cues;Tactile cues    Comprehension  Verbalized understanding;Returned demonstration          PT Long Term Goals - 07/05/18 1646      PT LONG TERM  GOAL #1   Title  Pt will reduce Rt UE measurements to 38cm 10cm proximal to the olecranon and to 27.8 at the olecranon    Time  4    Period  Weeks    Status  New    Target Date  08/02/18      PT LONG TERM GOAL #2   Title  Pt will obtain appropriate compression for the Rt UE to maintain size     Time  4    Period  Weeks    Status  New    Target Date  08/02/18      PT LONG TERM GOAL #3   Title  Pt will improve Rt shoulder ROM to at least flex: 145 and abd: 145 to improve mobility     Time  4    Period  Weeks    Status  New    Target Date  08/02/18      PT LONG TERM GOAL #4   Title  Pt will be ind with final HEP for continue lymphedema care and shoulder mobility     Time  4    Period  Weeks    Status  New    Target Date  08/02/18      PT LONG TERM GOAL #5   Title  Pt will decrease resting shoulder pain to 0/10     Time  4    Period  Weeks    Status  New    Target Date  08/02/18            Plan - 07/18/18 0842    Clinical Impression Statement  Pt is still limited by shoulder pain (?? bicipital tendinitis or bursitis??) She wants to get a reduction kit to help manage swelling and would consider an ionto patch for pain.  Will discuss options  with team and proceed     Rehab Potential  Good    Clinical Impairments Affecting Rehab Potential  pain level     PT Frequency  2x / week    PT Duration  4 weeks    PT Treatment/Interventions  ADLs/Self Care Home Management;Therapeutic exercise;Patient/family education;Passive range of motion;Manual lymph drainage;Manual techniques;Compression bandaging;Taping;Electrical Stimulation    PT Next Visit Plan  how was taping? review self MLD one more time, did she find her compression garment? proceed with redux kit and getting ionto order  Rt shoulder AAROM/PROM, tens for pain relief?, STM work into Rt UT/periscapular muscles, taping?    Consulted and Agree with Plan of Care  Patient       Patient will benefit from skilled therapeutic  intervention in order to improve the  following deficits and impairments:  Pain, Hypermobility, Decreased range of motion, Decreased strength, Impaired UE functional use  Visit Diagnosis: No diagnosis found.     Problem List Patient Active Problem List   Diagnosis Date Noted  . Heart murmur 11/13/2017  . Genetic testing 01/25/2016  . Microscopic hematuria 09/06/2015  . Vaginal dryness 12/09/2014  . Family history of breast cancer   . Malignant neoplasm of upper-outer quadrant of right breast in female, estrogen receptor negative ([place]) 11/14/2014  . Intermittent palpitations   . Obesity (BMI 35.0-39.9 without comorbidity)   . Hyperlipidemia LDL goal <100 07/07/2011  . General medical examination 07/07/2011  . History of partial seizures 04/01/2011  . Asthma 04/01/2011  . Mitral valve prolapse 04/01/2011  . Hot flashes 04/01/2011  . Tobacco abuse 04/01/2011  . Essential hypertension 04/01/2011  Donato Heinz. Owens Shark PT   Norwood Levo 07/18/2018, 8:45 AM  [place], [place], [address] Phone: [phone]   Fax:  [phone]  Name: Katie Jacobson MRN: [id number] Date of Birth: 1956-03-05

## 2018-07-19 ENCOUNTER — Ambulatory Visit: Payer: BLUE CROSS/BLUE SHIELD | Admitting: Rehabilitation

## 2018-07-19 ENCOUNTER — Encounter: Payer: Self-pay | Admitting: Rehabilitation

## 2018-07-19 DIAGNOSIS — M25511 Pain in right shoulder: Secondary | ICD-10-CM | POA: Diagnosis not present

## 2018-07-19 DIAGNOSIS — G8929 Other chronic pain: Secondary | ICD-10-CM

## 2018-07-19 DIAGNOSIS — M25611 Stiffness of right shoulder, not elsewhere classified: Secondary | ICD-10-CM

## 2018-07-19 DIAGNOSIS — I890 Lymphedema, not elsewhere classified: Secondary | ICD-10-CM

## 2018-07-19 DIAGNOSIS — R293 Abnormal posture: Secondary | ICD-10-CM

## 2018-07-19 NOTE — Therapy (Signed)
[place], [place], [address] Phone: [phone]   Fax:  [phone]  Physical Therapy Treatment  Patient Details  Name: Katie Jacobson MRN: [id number] Date of Birth: [birth year] Referring Provider (PT): Dr. Jana Hakim   Encounter Date: 07/19/2018  PT End of Session - 07/19/18 2111    Visit Number  4    Number of Visits  9    Date for PT Re-Evaluation  08/02/18    PT Start Time  5397    PT Stop Time  1600    PT Time Calculation (min)  45 min    Activity Tolerance  Patient tolerated treatment well    Behavior During Therapy  [facility] for tasks assessed/performed       Past Medical History:  Diagnosis Date  . Breast cancer of upper-outer quadrant of right female breast ([place]) 11/14/2014   finished chemo / over year  . Breast cancer, right breast ([place])   . History of chemotherapy    finished chemo 02/03/2015  . History of kidney stones   . History of seizures    as a child - unknown cause - states was never on anticonvulsants  . Hypertension    states under control with meds., has been on med. x "years"  . Mitral valve prolapse 1990  . Osteoporosis   . Personal history of chemotherapy   . Personal history of radiation therapy   . Seasonal allergies     Past Surgical History:  Procedure Laterality Date  . ABDOMINAL HYSTERECTOMY  1998   partial  . APPENDECTOMY  1976  . BREAST LUMPECTOMY Right 02/2015  . COLONOSCOPY    . KNEE ARTHROSCOPY Left 03/26/2008  . PLANTAR'S WART EXCISION Left    x 2  . PORT-A-CATH REMOVAL Left 04/06/2015   Procedure: REMOVAL PORT-A-CATH;  Surgeon: Rolm Bookbinder, MD;  Location: [facility];  Service: General;  Laterality: Left;  . PORTACATH PLACEMENT N/A 11/27/2014   Procedure: INSERTION PORT-A-CATH;  Surgeon: Rolm Bookbinder, MD;  Location: [facility];  Service: General;  Laterality: N/A;  . RADIOACTIVE SEED GUIDED PARTIAL MASTECTOMY WITH  AXILLARY SENTINEL LYMPH NODE BIOPSY Right 03/02/2015   Procedure: RADIOACTIVE SEED GUIDED RIGHT PARTIAL MASTECTOMY WITH RIGHT AXILLARY SENTINEL LYMPH NODE BIOPSY;  Surgeon: Rolm Bookbinder, MD;  Location: [facility];  Service: General;  Laterality: Right;  . SALIVARY STONE REMOVAL Right 1972  . TONSILLECTOMY  1970s  . TUBAL LIGATION  1996    There were no vitals filed for this visit.  Subjective Assessment - 07/19/18 1519    Subjective  Its a little better until at night.  I just vaccuumed again and now it hurts.  I think the taping helps.  I keep playing phone tag with the pump people.  I am going to get the garments    Pertinent History  triple negative breast cancer s/p Rt lumpectomy in 2016 with neo-adjuvant chemotherapy and adjuvant radiation with history of treatment here.  osteoporosis     Patient Stated Goals  improve raising the arm overhead, hooking the bra, decrease the shoulder pain    Pain Score  4     Pain Location  Shoulder    Pain Orientation  Right    Pain Descriptors / Indicators  Aching    Pain Type  Chronic pain    Pain Onset  More than a month ago    Pain Frequency  Constant    Aggravating Factors  moving it    Pain Relieving Factors  rest and tylenol                       [facility] Adult PT Treatment/Exercise - 07/19/18 0001      Manual Therapy   Manual Lymphatic Drainage (MLD)  MLD to the Rt UE; short neck, abdominals, deep abdominals, established anterior interaxillary pathway and Rt axillo inguinal pathway, Rt UE from proximal to distal and reversing all steps proximally and then in Lt sidelying posterior interaxillary pathway and scapular/posterior shoulder      Kinesiotix   Edema  4 tail fan across back with anchor on the Lt as well as one Y strip to the deltoid with arm prepositioned into ER and scapular retraction                  PT Long Term Goals - 07/19/18 2119      PT LONG TERM GOAL #1   Title  Pt will  reduce Rt UE measurements to 38cm 10cm proximal to the olecranon and to 27.8 at the olecranon    Status  On-going      PT LONG TERM GOAL #2   Title  Pt will obtain appropriate compression for the Rt UE to maintain size     Status  On-going      PT LONG TERM GOAL #3   Title  Pt will improve Rt shoulder ROM to at least flex: 145 and abd: 145 to improve mobility     Status  On-going            Plan - 07/19/18 2112    Clinical Impression Statement  Shoulder is still painful although reportedly less painful now at a 3-4/10 with help from the taping per patient.  THe script is not signed yet but pt is agreeable to getting the velcro garment and flat knit.  Will add into patch to POC but pain was not isolated locally today.      PT Frequency  2x / week    PT Duration  4 weeks    PT Treatment/Interventions  ADLs/Self Care Home Management;Therapeutic exercise;Patient/family education;Passive range of motion;Manual lymph drainage;Manual techniques;Compression bandaging;Taping;Electrical Stimulation    PT Next Visit Plan  continue taping? script signed back? remeasure UE.  start shoulder AAROM, Tens for pain relief? Ionto?    Consulted and Agree with Plan of Care  Patient       Patient will benefit from skilled therapeutic intervention in order to improve the following deficits and impairments:  Pain, Hypermobility, Decreased range of motion, Decreased strength, Impaired UE functional use  Visit Diagnosis: Chronic right shoulder pain  Abnormal posture  Stiffness of right shoulder, not elsewhere classified  Lymphedema, not elsewhere classified     Problem List Patient Active Problem List   Diagnosis Date Noted  . Heart murmur 11/13/2017  . Genetic testing 01/25/2016  . Microscopic hematuria 09/06/2015  . Vaginal dryness 12/09/2014  . Family history of breast cancer   . Malignant neoplasm of upper-outer quadrant of right breast in female, estrogen receptor negative ([place])  11/14/2014  . Intermittent palpitations   . Obesity (BMI 35.0-39.9 without comorbidity)   . Hyperlipidemia LDL goal <100 07/07/2011  . General medical examination 07/07/2011  . History of partial seizures 04/01/2011  . Asthma 04/01/2011  . Mitral valve prolapse 04/01/2011  . Hot flashes 04/01/2011  . Tobacco abuse 04/01/2011  . Essential hypertension 04/01/2011    Shan Levans PT  07/19/2018, 9:20 PM  [place], [place], [address] Phone: [phone]   Fax:  [phone]  Name: Katie Jacobson MRN: [id number] Date of Birth: [birth year]

## 2018-07-23 ENCOUNTER — Telehealth: Payer: Self-pay | Admitting: *Deleted

## 2018-07-23 NOTE — Telephone Encounter (Signed)
Received Physician Orders from [facility] [with Rx from Dr. Magrinat] for a [facility] to reduce swelling in legs and heal any ulcers. Request for 2-3 OV/chart notes with "documentation of compression stockings/elevation/exercise or severity of the edema to support the case, as Dr. Jana Hakim did not have enough documentation to support Insurance guidelines. Our OV/chart notes do not show pt exhibiting edema. Reviewed 2017 & 2018 CPEs. She does regularly attend Outpatient Rehab [[address]] for including but not limited to: Right Shoulder pain & stiffness and Lymphedema; forwarded to provider/SLS 10/14

## 2018-07-23 NOTE — Telephone Encounter (Signed)
Please forward paperwork to Dr. Jana Hakim since he is the ordering provider.

## 2018-07-24 ENCOUNTER — Encounter: Payer: Self-pay | Admitting: Physical Therapy

## 2018-07-24 ENCOUNTER — Ambulatory Visit: Payer: BLUE CROSS/BLUE SHIELD | Admitting: Physical Therapy

## 2018-07-24 DIAGNOSIS — G8929 Other chronic pain: Secondary | ICD-10-CM

## 2018-07-24 DIAGNOSIS — M25511 Pain in right shoulder: Secondary | ICD-10-CM | POA: Diagnosis not present

## 2018-07-24 DIAGNOSIS — R293 Abnormal posture: Secondary | ICD-10-CM

## 2018-07-24 DIAGNOSIS — M25611 Stiffness of right shoulder, not elsewhere classified: Secondary | ICD-10-CM

## 2018-07-24 NOTE — Therapy (Signed)
[place], [place], [address] Phone: [phone]   Fax:  [phone]  Physical Therapy Treatment  Patient Details  Name: Katie Jacobson MRN: [id number] Date of Birth: September 28, 1956 Referring Provider (PT): Dr. Jana Hakim   Encounter Date: 07/24/2018  PT End of Session - 07/24/18 1740    Visit Number  5    Number of Visits  9    Date for PT Re-Evaluation  08/02/18    PT Start Time  1520    PT Stop Time  1610    PT Time Calculation (min)  50 min    Activity Tolerance  Patient tolerated treatment well    Behavior During Therapy  [facility] for tasks assessed/performed       Past Medical History:  Diagnosis Date  . Breast cancer of upper-outer quadrant of right female breast ([place]) 11/14/2014   finished chemo / over year  . Breast cancer, right breast ([place])   . History of chemotherapy    finished chemo 02/03/2015  . History of kidney stones   . History of seizures    as a child - unknown cause - states was never on anticonvulsants  . Hypertension    states under control with meds., has been on med. x "years"  . Mitral valve prolapse 1990  . Osteoporosis   . Personal history of chemotherapy   . Personal history of radiation therapy   . Seasonal allergies     Past Surgical History:  Procedure Laterality Date  . ABDOMINAL HYSTERECTOMY  1998   partial  . APPENDECTOMY  1976  . BREAST LUMPECTOMY Right 02/2015  . COLONOSCOPY    . KNEE ARTHROSCOPY Left 03/26/2008  . PLANTAR'S WART EXCISION Left    x 2  . PORT-A-CATH REMOVAL Left 04/06/2015   Procedure: REMOVAL PORT-A-CATH;  Surgeon: Rolm Bookbinder, MD;  Location: [facility];  Service: General;  Laterality: Left;  . PORTACATH PLACEMENT N/A 11/27/2014   Procedure: INSERTION PORT-A-CATH;  Surgeon: Rolm Bookbinder, MD;  Location: [place] [facility];  Service: General;  Laterality: N/A;  . RADIOACTIVE SEED GUIDED PARTIAL MASTECTOMY WITH  AXILLARY SENTINEL LYMPH NODE BIOPSY Right 03/02/2015   Procedure: RADIOACTIVE SEED GUIDED RIGHT PARTIAL MASTECTOMY WITH RIGHT AXILLARY SENTINEL LYMPH NODE BIOPSY;  Surgeon: Rolm Bookbinder, MD;  Location: [place];  Service: General;  Laterality: Right;  . SALIVARY STONE REMOVAL Right 1972  . TONSILLECTOMY  1970s  . TUBAL LIGATION  1996    There were no vitals filed for this visit.  Subjective Assessment - 07/24/18 1523    Subjective  Pt says she is not feeling good today.  Her arm feels about the same as last week.  She has been sleeping with a pillow under her arm as that seems to help. She still has the kinesiotape on.     Pertinent History  triple negative breast cancer s/p Rt lumpectomy in 2016 with neo-adjuvant chemotherapy and adjuvant radiation with history of treatment here.  osteoporosis     Patient Stated Goals  improve raising the arm overhead, hooking the bra, decrease the shoulder pain    Currently in Pain?  Yes    Pain Score  4     Pain Location  Shoulder    Pain Orientation  Right            LYMPHEDEMA/ONCOLOGY QUESTIONNAIRE - 07/24/18 1525      Right Upper Extremity Lymphedema   15 cm Proximal to Olecranon  Process  40 cm    10 cm Proximal to Olecranon Process  39 cm    Olecranon Process  29 cm    10 cm Proximal to Ulnar Styloid Process  26 cm    Just Proximal to Ulnar Styloid Process  17 cm    Across Hand at [employer]  19 cm    At [place] of 2nd Digit  6.1 cm                [facility] Adult PT Treatment/Exercise - 07/24/18 0001      Manual Therapy   Manual Therapy  Soft tissue mobilization;Manual Lymphatic Drainage (MLD)    Soft tissue mobilization  with thick cream, soft tissue work to biceps muscle and tendon with stretching and deep friction massage at subacromial joint.  Pt found to have a very tight tender trigger point at posterior axilla that is limited shoudler movmement.     McConnell  white tape, then brown tape from front  to back to help with shoulder pain     Kinesiotex  --                  PT Long Term Goals - 07/19/18 2119      PT LONG TERM GOAL #1   Title  Pt will reduce Rt UE measurements to 38cm 10cm proximal to the olecranon and to 27.8 at the olecranon    Status  On-going      PT LONG TERM GOAL #2   Title  Pt will obtain appropriate compression for the Rt UE to maintain size     Status  On-going      PT LONG TERM GOAL #3   Title  Pt will improve Rt shoulder ROM to at least flex: 145 and abd: 145 to improve mobility     Status  On-going            Plan - 07/24/18 1740    Clinical Impression Statement  pt reports she her shoulder is still painful and limited range of motion, but is better than she started.  She was found to have very tight tender trigger point at posterior axilla that should be worked on next session Pt circumference measurements are mostly improved and she is getting her pump demonstration tomorrow.     PT Next Visit Plan  continue taping? script signed back? start shoulder AAROM, Tens for pain relief? Ionto?  Soft tissue work at posterior shoulder and mofascial release there        Patient will benefit from skilled therapeutic intervention in order to improve the following deficits and impairments:     Visit Diagnosis: Chronic right shoulder pain  Abnormal posture  Stiffness of right shoulder, not elsewhere classified     Problem List Patient Active Problem List   Diagnosis Date Noted  . Heart murmur 11/13/2017  . Genetic testing 01/25/2016  . Microscopic hematuria 09/06/2015  . Vaginal dryness 12/09/2014  . Family history of breast cancer   . Malignant neoplasm of upper-outer quadrant of right breast in female, estrogen receptor negative ([place]) 11/14/2014  . Intermittent palpitations   . Obesity (BMI 35.0-39.9 without comorbidity)   . Hyperlipidemia LDL goal <100 07/07/2011  . General medical examination 07/07/2011  . History of partial  seizures 04/01/2011  . Asthma 04/01/2011  . Mitral valve prolapse 04/01/2011  . Hot flashes 04/01/2011  . Tobacco abuse 04/01/2011  . Essential hypertension 04/01/2011   Donato Heinz. Owens Shark PT  Owens Shark,  [place] Cyphers 07/24/2018, 5:43 PM  [place], [place], [address] Phone: [phone]   Fax:  [phone]  Name: Katie Jacobson MRN: [id number] Date of Birth: 07-27-1956

## 2018-07-26 ENCOUNTER — Ambulatory Visit: Payer: BLUE CROSS/BLUE SHIELD | Admitting: Rehabilitation

## 2018-07-27 NOTE — Telephone Encounter (Signed)
Informed PCP to transfer message to sender that we also did not have enough/any documentation that they were requesting, so paperwork was deferred back to Dr. Jana Hakim. Also, gave her information regarding OP Rehab that did have possible needed diagnosis and documentation that they were needing; caller understood and agreed/SLS 10/18

## 2018-07-31 ENCOUNTER — Ambulatory Visit: Payer: BLUE CROSS/BLUE SHIELD | Admitting: Physical Therapy

## 2018-07-31 ENCOUNTER — Encounter: Payer: Self-pay | Admitting: Physical Therapy

## 2018-07-31 DIAGNOSIS — R293 Abnormal posture: Secondary | ICD-10-CM

## 2018-07-31 DIAGNOSIS — M25511 Pain in right shoulder: Principal | ICD-10-CM

## 2018-07-31 DIAGNOSIS — I890 Lymphedema, not elsewhere classified: Secondary | ICD-10-CM

## 2018-07-31 DIAGNOSIS — M25611 Stiffness of right shoulder, not elsewhere classified: Secondary | ICD-10-CM

## 2018-07-31 DIAGNOSIS — G8929 Other chronic pain: Secondary | ICD-10-CM

## 2018-07-31 NOTE — Therapy (Signed)
[place], [place], [address] Phone: [phone]   Fax:  [phone]  Physical Therapy Treatment  Patient Details  Name: Katie Jacobson MRN: [id number] Date of Birth: 09-Dec-1955 Referring Provider (PT): Dr. Jana Hakim   Encounter Date: 07/31/2018  PT End of Session - 07/31/18 2018    Visit Number  7    Number of Visits  9    Date for PT Re-Evaluation  08/02/18    PT Start Time  9628    PT Stop Time  1600    PT Time Calculation (min)  45 min    Activity Tolerance  Patient tolerated treatment well    Behavior During Therapy  [facility] for tasks assessed/performed       Past Medical History:  Diagnosis Date  . Breast cancer of upper-outer quadrant of right female breast ([place]) 11/14/2014   finished chemo / over year  . Breast cancer, right breast ([place])   . History of chemotherapy    finished chemo 02/03/2015  . History of kidney stones   . History of seizures    as a child - unknown cause - states was never on anticonvulsants  . Hypertension    states under control with meds., has been on med. x "years"  . Mitral valve prolapse 1990  . Osteoporosis   . Personal history of chemotherapy   . Personal history of radiation therapy   . Seasonal allergies     Past Surgical History:  Procedure Laterality Date  . ABDOMINAL HYSTERECTOMY  1998   partial  . APPENDECTOMY  1976  . BREAST LUMPECTOMY Right 02/2015  . COLONOSCOPY    . KNEE ARTHROSCOPY Left 03/26/2008  . PLANTAR'S WART EXCISION Left    x 2  . PORT-A-CATH REMOVAL Left 04/06/2015   Procedure: REMOVAL PORT-A-CATH;  Surgeon: Rolm Bookbinder, MD;  Location: [place];  Service: General;  Laterality: Left;  . PORTACATH PLACEMENT N/A 11/27/2014   Procedure: INSERTION PORT-A-CATH;  Surgeon: Rolm Bookbinder, MD;  Location: [place];  Service: General;  Laterality: N/A;  . RADIOACTIVE SEED GUIDED PARTIAL MASTECTOMY WITH  AXILLARY SENTINEL LYMPH NODE BIOPSY Right 03/02/2015   Procedure: RADIOACTIVE SEED GUIDED RIGHT PARTIAL MASTECTOMY WITH RIGHT AXILLARY SENTINEL LYMPH NODE BIOPSY;  Surgeon: Rolm Bookbinder, MD;  Location: [place];  Service: General;  Laterality: Right;  . SALIVARY STONE REMOVAL Right 1972  . TONSILLECTOMY  1970s  . TUBAL LIGATION  1996    There were no vitals filed for this visit.  Subjective Assessment - 07/31/18 1525    Subjective  Pt says is the Flexitouch is on the the way.  She is still having pain in shoulder   (Pended)     Pertinent History  triple negative breast cancer s/p Rt lumpectomy in 2016 with neo-adjuvant chemotherapy and adjuvant radiation with history of treatment here.  osteoporosis   (Pended)     Patient Stated Goals  improve raising the arm overhead, hooking the bra, decrease the shoulder pain  (Pended)     Currently in Pain?  Yes  (Pended)    "Its not bad"  it aches mainly at night    Pain Score  3   (Pended)    4 at night                       [facility] Adult PT Treatment/Exercise - 07/31/18 0001      Manual Therapy  Manual Therapy  Soft tissue mobilization;Manual Traction    Manual therapy comments  pt benefits from tg soft to upper arm, she is ready to get a velcro compression garment    Soft tissue mobilization  with pt in left sidelying and thick massage cream, soft tissue mobilization to tight muscles in right cervcial and scapular area with polonged pressure at tiggerpoints in upper trap, interescapular muslces and posterior axillary muscles.  Trigger points were persistent, but pt felt some release after treatment    Manual Traction  in supine to cervical area.  pt with tightness in scalenes that did have some release                  PT Long Term Goals - 07/19/18 2119      PT LONG TERM GOAL #1   Title  Pt will reduce Rt UE measurements to 38cm 10cm proximal to the olecranon and to 27.8 at the olecranon     Status  On-going      PT LONG TERM GOAL #2   Title  Pt will obtain appropriate compression for the Rt UE to maintain size     Status  On-going      PT LONG TERM GOAL #3   Title  Pt will improve Rt shoulder ROM to at least flex: 145 and abd: 145 to improve mobility     Status  On-going            Plan - 07/31/18 2019    Clinical Impression Statement  Pt continues with significant pain and tightness in right neck and scapular muscles that is limiting her range of motion and function.  She is benefitting from the compression so is ready to get a velcro garement as compression sleeve with lkely cause too much pain and trauma to the shoulder with donning and doffing. She reported some releif of pain after treatment today     PT Next Visit Plan  REassess and renew.. Look for script and give pt information to [facility] for [facility] garment? nighttime? continue taping? script signed back? start shoulder AAROM, Tens for pain relief? Ionto?  Soft tissue work at posterior shoulder and mofascial release there     Consulted and Agree with Plan of Care  Patient       Patient will benefit from skilled therapeutic intervention in order to improve the following deficits and impairments:     Visit Diagnosis: Chronic right shoulder pain  Abnormal posture  Stiffness of right shoulder, not elsewhere classified  Lymphedema, not elsewhere classified     Problem List Patient Active Problem List   Diagnosis Date Noted  . Heart murmur 11/13/2017  . Genetic testing 01/25/2016  . Microscopic hematuria 09/06/2015  . Vaginal dryness 12/09/2014  . Family history of breast cancer   . Malignant neoplasm of upper-outer quadrant of right breast in female, estrogen receptor negative ([place]) 11/14/2014  . Intermittent palpitations   . Obesity (BMI 35.0-39.9 without comorbidity)   . Hyperlipidemia LDL goal <100 07/07/2011  . General medical examination 07/07/2011  . History of partial seizures  04/01/2011  . Asthma 04/01/2011  . Mitral valve prolapse 04/01/2011  . Hot flashes 04/01/2011  . Tobacco abuse 04/01/2011  . Essential hypertension 04/01/2011   Donato Heinz. Owens Shark PT  Norwood Levo 07/31/2018, 8:28 PM  [place], [place], [address] Phone: [phone]   Fax:  [phone]  Name: Katie Jacobson  MRN: [id number] Date of Birth: 01-24-1956

## 2018-08-02 ENCOUNTER — Other Ambulatory Visit: Payer: Self-pay

## 2018-08-02 ENCOUNTER — Encounter: Payer: Self-pay | Admitting: Physical Therapy

## 2018-08-02 ENCOUNTER — Ambulatory Visit: Payer: BLUE CROSS/BLUE SHIELD | Admitting: Physical Therapy

## 2018-08-02 DIAGNOSIS — I890 Lymphedema, not elsewhere classified: Secondary | ICD-10-CM

## 2018-08-02 DIAGNOSIS — G8929 Other chronic pain: Secondary | ICD-10-CM

## 2018-08-02 DIAGNOSIS — M25511 Pain in right shoulder: Principal | ICD-10-CM

## 2018-08-02 DIAGNOSIS — R293 Abnormal posture: Secondary | ICD-10-CM

## 2018-08-02 DIAGNOSIS — M25611 Stiffness of right shoulder, not elsewhere classified: Secondary | ICD-10-CM

## 2018-08-02 NOTE — Therapy (Signed)
[place], [place], [address] Phone: [phone]   Fax:  [phone]  Physical Therapy Treatment  Patient Details  Name: Katie Jacobson MRN: [id number] Date of Birth: 1955-12-10 Referring Provider (PT): Dr. Jana Hakim   Encounter Date: 08/02/2018  PT End of Session - 08/02/18 1609    Visit Number  8    Number of Visits  17    Date for PT Re-Evaluation  08/30/18    PT Start Time  9833    PT Stop Time  1604    PT Time Calculation (min)  43 min    Activity Tolerance  Patient tolerated treatment well    Behavior During Therapy  [facility] for tasks assessed/performed       Past Medical History:  Diagnosis Date  . Breast cancer of upper-outer quadrant of right female breast ([place]) 11/14/2014   finished chemo / over year  . Breast cancer, right breast ([place])   . History of chemotherapy    finished chemo 02/03/2015  . History of kidney stones   . History of seizures    as a child - unknown cause - states was never on anticonvulsants  . Hypertension    states under control with meds., has been on med. x "years"  . Mitral valve prolapse 1990  . Osteoporosis   . Personal history of chemotherapy   . Personal history of radiation therapy   . Seasonal allergies     Past Surgical History:  Procedure Laterality Date  . ABDOMINAL HYSTERECTOMY  1998   partial  . APPENDECTOMY  1976  . BREAST LUMPECTOMY Right 02/2015  . COLONOSCOPY    . KNEE ARTHROSCOPY Left 03/26/2008  . PLANTAR'S WART EXCISION Left    x 2  . PORT-A-CATH REMOVAL Left 04/06/2015   Procedure: REMOVAL PORT-A-CATH;  Surgeon: Rolm Bookbinder, MD;  Location: [place];  Service: General;  Laterality: Left;  . PORTACATH PLACEMENT N/A 11/27/2014   Procedure: INSERTION PORT-A-CATH;  Surgeon: Rolm Bookbinder, MD;  Location: [place];  Service: General;  Laterality: N/A;  . RADIOACTIVE SEED GUIDED PARTIAL MASTECTOMY WITH  AXILLARY SENTINEL LYMPH NODE BIOPSY Right 03/02/2015   Procedure: RADIOACTIVE SEED GUIDED RIGHT PARTIAL MASTECTOMY WITH RIGHT AXILLARY SENTINEL LYMPH NODE BIOPSY;  Surgeon: Rolm Bookbinder, MD;  Location: [place];  Service: General;  Laterality: Right;  . SALIVARY STONE REMOVAL Right 1972  . TONSILLECTOMY  1970s  . TUBAL LIGATION  1996    There were no vitals filed for this visit.  Subjective Assessment - 08/02/18 1523    Subjective  My neck is fine today. I think I need the lymph drainage today.     Pertinent History  triple negative breast cancer s/p Rt lumpectomy in 2016 with neo-adjuvant chemotherapy and adjuvant radiation with history of treatment here.  osteoporosis     Patient Stated Goals  improve raising the arm overhead, hooking the bra, decrease the shoulder pain    Currently in Pain?  Yes    Pain Score  3     Pain Location  Shoulder    Pain Orientation  Right    Pain Descriptors / Indicators  Aching         [facility] PT Assessment - 08/02/18 0001      AROM   Right Shoulder Flexion  121 Degrees    Right Shoulder ABduction  69 Degrees        LYMPHEDEMA/ONCOLOGY QUESTIONNAIRE - 08/02/18 1527  Right Upper Extremity Lymphedema   15 cm Proximal to Olecranon Process  38.9 cm    10 cm Proximal to Olecranon Process  39 cm    Olecranon Process  29.5 cm    10 cm Proximal to Ulnar Styloid Process  25 cm    Just Proximal to Ulnar Styloid Process  16.6 cm    Across Hand at [employer]  19.1 cm    At [place] of 2nd Digit  6 cm                [facility] Adult PT Treatment/Exercise - 08/02/18 0001      Manual Therapy   Manual Therapy  Manual Lymphatic Drainage (MLD);Scapular mobilization;Passive ROM    Scapular Mobilization  in L s/l to R scapula while moving RUE through flexion    Manual Lymphatic Drainage (MLD)  MLD to the Rt UE; short neck, abdominals, deep abdominals, established anterior interaxillary pathway and Rt axillo inguinal pathway, Rt  UE from proximal to distal and reversing all steps proximally     Passive ROM  in supine to R shoulder in direction of flexion and abduction                  PT Long Term Goals - 08/02/18 1526      PT LONG TERM GOAL #1   Title  Pt will reduce Rt UE measurements to 38cm 10cm proximal to the olecranon and to 27.8 at the olecranon    Baseline  08/02/18- 29.5 at olecranon,  38.9 - 15 cm prox to olecranon    Time  4    Period  Weeks    Status  On-going      PT LONG TERM GOAL #2   Title  Pt will obtain appropriate compression for the Rt UE to maintain size     Baseline  08/02/18- pt just got Rx to get garments and plans to go get fitted    Time  4    Period  Weeks    Status  On-going      PT LONG TERM GOAL #3   Title  Pt will improve Rt shoulder ROM to at least flex: 145 and abd: 145 to improve mobility     Baseline  R 121 flexion, R 69 abd    Time  4    Period  Weeks    Status  On-going      PT LONG TERM GOAL #4   Title  Pt will be ind with final HEP for continue lymphedema care and shoulder mobility     Time  4    Period  Weeks    Status  On-going      PT LONG TERM GOAL #5   Title  Pt will decrease resting shoulder pain to 0/10     Baseline  08/02/18- pt reports this is a 3/10    Time  4    Period  Weeks    Status  On-going            Plan - 08/02/18 1609    Clinical Impression Statement  Assessed pt's progress towards goals in therapy. Pt demonstrates improvements in right shoulder flexion but is still very limited with abduction ROM. She is still having right shoulder pain though this is improving. Pt has been wearing TG soft with top folded down for several weeks to add more compression at upper arm and reports that this helps decrease the pain in her arm.  She would benefit from a compression pump to help further reduce her arm swelling and allow her to indepdently manage her RUE lymphedema. Pt would benefit from continued skilled PT services to decrease  right shoulder pain, improve R shoulder ROM, progress pt towards being able to independently manage lymphedema and instruct her in a home exercise program.     Rehab Potential  Good    Clinical Impairments Affecting Rehab Potential  pain level     PT Frequency  2x / week    PT Duration  4 weeks    PT Treatment/Interventions  ADLs/Self Care Home Management;Therapeutic exercise;Patient/family education;Passive range of motion;Manual lymph drainage;Manual techniques;Compression bandaging;Taping;Electrical Stimulation;Iontophoresis 4mg /ml Dexamethasone    PT Next Visit Plan  begin ionto for R shoulder pain if new cert is signed, continue PROM to R shoulder as well as soft tissue work and do MLD as needed, see if pt made appt at [facility] and Agree with Plan of Care  Patient       Patient will benefit from skilled therapeutic intervention in order to improve the following deficits and impairments:  Pain, Hypermobility, Decreased range of motion, Decreased strength, Impaired UE functional use  Visit Diagnosis: Chronic right shoulder pain - Plan: PT plan of care cert/re-cert  Abnormal posture - Plan: PT plan of care cert/re-cert  Stiffness of right shoulder, not elsewhere classified - Plan: PT plan of care cert/re-cert  Lymphedema, not elsewhere classified - Plan: PT plan of care cert/re-cert     Problem List Patient Active Problem List   Diagnosis Date Noted  . Heart murmur 11/13/2017  . Genetic testing 01/25/2016  . Microscopic hematuria 09/06/2015  . Vaginal dryness 12/09/2014  . Family history of breast cancer   . Malignant neoplasm of upper-outer quadrant of right breast in female, estrogen receptor negative ([place]) 11/14/2014  . Intermittent palpitations   . Obesity (BMI 35.0-39.9 without comorbidity)   . Hyperlipidemia LDL goal <100 07/07/2011  . General medical examination 07/07/2011  . History of partial seizures 04/01/2011  . Asthma 04/01/2011  . Mitral  valve prolapse 04/01/2011  . Hot flashes 04/01/2011  . Tobacco abuse 04/01/2011  . Essential hypertension 04/01/2011    [facility] 08/02/2018, 4:15 PM  [place], [place], [address] Phone: [phone]   Fax:  [phone]  Name: Katie Jacobson MRN: [id number] Date of Birth: 1956/01/26  Manus Gunning, PT 08/02/18 4:15 PM

## 2018-08-03 ENCOUNTER — Ambulatory Visit (INDEPENDENT_AMBULATORY_CARE_PROVIDER_SITE_OTHER): Payer: BLUE CROSS/BLUE SHIELD

## 2018-08-03 DIAGNOSIS — Z23 Encounter for immunization: Secondary | ICD-10-CM | POA: Diagnosis not present

## 2018-08-07 ENCOUNTER — Encounter: Payer: Self-pay | Admitting: *Deleted

## 2018-08-07 ENCOUNTER — Encounter: Payer: Self-pay | Admitting: Physical Therapy

## 2018-08-07 ENCOUNTER — Ambulatory Visit: Payer: BLUE CROSS/BLUE SHIELD | Admitting: Physical Therapy

## 2018-08-07 DIAGNOSIS — M25511 Pain in right shoulder: Secondary | ICD-10-CM | POA: Diagnosis not present

## 2018-08-07 DIAGNOSIS — G8929 Other chronic pain: Secondary | ICD-10-CM

## 2018-08-07 DIAGNOSIS — I890 Lymphedema, not elsewhere classified: Secondary | ICD-10-CM

## 2018-08-07 DIAGNOSIS — M25611 Stiffness of right shoulder, not elsewhere classified: Secondary | ICD-10-CM

## 2018-08-07 DIAGNOSIS — R293 Abnormal posture: Secondary | ICD-10-CM

## 2018-08-07 NOTE — Patient Instructions (Signed)

## 2018-08-07 NOTE — Telephone Encounter (Signed)
This encounter was created in error - please disregard.

## 2018-08-07 NOTE — Therapy (Signed)
[place], [place], [address] Phone: [phone]   Fax:  [phone]  Physical Therapy Treatment  Patient Details  Name: Katie Jacobson MRN: [id number] Date of Birth: 01-07-1956 Referring Provider (PT): Dr. Jana Hakim   Encounter Date: 08/07/2018  PT End of Session - 08/07/18 1824    Visit Number  9   9   Number of Visits  17    Date for PT Re-Evaluation  08/30/18    PT Start Time  1430    Activity Tolerance  Patient tolerated treatment well    Behavior During Therapy  [facility] for tasks assessed/performed       Past Medical History:  Diagnosis Date  . Breast cancer of upper-outer quadrant of right female breast ([place]) 11/14/2014   finished chemo / over year  . Breast cancer, right breast ([place])   . History of chemotherapy    finished chemo 02/03/2015  . History of kidney stones   . History of seizures    as a child - unknown cause - states was never on anticonvulsants  . Hypertension    states under control with meds., has been on med. x "years"  . Mitral valve prolapse 1990  . Osteoporosis   . Personal history of chemotherapy   . Personal history of radiation therapy   . Seasonal allergies     Past Surgical History:  Procedure Laterality Date  . ABDOMINAL HYSTERECTOMY  1998   partial  . APPENDECTOMY  1976  . BREAST LUMPECTOMY Right 02/2015  . COLONOSCOPY    . KNEE ARTHROSCOPY Left 03/26/2008  . PLANTAR'S WART EXCISION Left    x 2  . PORT-A-CATH REMOVAL Left 04/06/2015   Procedure: REMOVAL PORT-A-CATH;  Surgeon: Rolm Bookbinder, MD;  Location: [place];  Service: General;  Laterality: Left;  . PORTACATH PLACEMENT N/A 11/27/2014   Procedure: INSERTION PORT-A-CATH;  Surgeon: Rolm Bookbinder, MD;  Location: [place];  Service: General;  Laterality: N/A;  . RADIOACTIVE SEED GUIDED PARTIAL MASTECTOMY WITH AXILLARY SENTINEL LYMPH NODE BIOPSY Right 03/02/2015   Procedure: RADIOACTIVE SEED GUIDED RIGHT PARTIAL MASTECTOMY WITH RIGHT AXILLARY SENTINEL LYMPH NODE BIOPSY;  Surgeon: Rolm Bookbinder, MD;  Location: [place];  Service: General;  Laterality: Right;  . SALIVARY STONE REMOVAL Right 1972  . TONSILLECTOMY  1970s  . TUBAL LIGATION  1996    There were no vitals filed for this visit.  Subjective Assessment - 08/07/18 1438    Subjective  Pt has the pump at home and is using it.  She is getting the sleeves measured tomorrow.      Pertinent History  triple negative breast cancer s/p Rt lumpectomy in 2016 with neo-adjuvant chemotherapy and adjuvant radiation with history of treatment here.  osteoporosis     Patient Stated Goals  improve raising the arm overhead, hooking the bra, decrease the shoulder pain    Currently in Pain?  Yes    Pain Score  3     Pain Location  Shoulder                       [facility] Adult PT Treatment/Exercise - 08/07/18 0001      Exercises   Exercises  Shoulder;Lumbar      Shoulder Exercises: Supine   Protraction  AROM;Right;Left;10 reps    Horizontal ABduction  AAROM;Right;5 reps    Horizontal ABduction Limitations  painful    External Rotation  AAROM;Right;5  reps    External Rotation Limitations  painful    Flexion  AROM;Right;5 reps    Flexion Limitations  pt is able to do more shoulder flexion with elbow bent      Shoulder Exercises: Seated   Other Seated Exercises  shoulder backward rolls and scapular retraction    Other Seated Exercises  alternating isometrics and holds with arm placed by therapist at various angles      Shoulder Exercises: Sidelying   External Rotation  Strengthening;Right;5 reps    External Rotation Limitations  isometric holds with 2 pound weight    Other Sidelying Exercises  alternating isometrics against therapist manual resistance with hand pointed to ceiling.       Modalities   Modalities  Iontophoresis      Iontophoresis   Type of Iontophoresis   Dexamethasone    Location  right shoulder    Dose  4mg    1 ml   Time  6 hours : pt instructed to remove patch      Manual Therapy   Manual Therapy  Myofascial release    Myofascial Release  prolonged holds at painful trigger points to allow for myofascial release at various places at lateral scapula and posterior axilla                   PT Long Term Goals - 08/02/18 1526      PT LONG TERM GOAL #1   Title  Pt will reduce Rt UE measurements to 38cm 10cm proximal to the olecranon and to 27.8 at the olecranon    Baseline  08/02/18- 29.5 at olecranon,  38.9 - 15 cm prox to olecranon    Time  4    Period  Weeks    Status  On-going      PT LONG TERM GOAL #2   Title  Pt will obtain appropriate compression for the Rt UE to maintain size     Baseline  08/02/18- pt just got Rx to get garments and plans to go get fitted    Time  4    Period  Weeks    Status  On-going      PT LONG TERM GOAL #3   Title  Pt will improve Rt shoulder ROM to at least flex: 145 and abd: 145 to improve mobility     Baseline  R 121 flexion, R 69 abd    Time  4    Period  Weeks    Status  On-going      PT LONG TERM GOAL #4   Title  Pt will be ind with final HEP for continue lymphedema care and shoulder mobility     Time  4    Period  Weeks    Status  On-going      PT LONG TERM GOAL #5   Title  Pt will decrease resting shoulder pain to 0/10     Baseline  08/02/18- pt reports this is a 3/10    Time  4    Period  Weeks    Status  On-going            Plan - 08/07/18 1825    Clinical Impression Statement  Pt continues with right shoulder pain that is limiting her ROM and difficulty with ADLs such as dressing.  Added ionto today to see if that will help decrease the inflammatory process.  If pain persists a few more weeks, she may need consult to orthopedic MD  PT Next Visit Plan  assess ionto for R shoulder pain and continue if helpful continue PROM to R shoulder as well as soft tissue  work and do MLD as needed, see if pt made appt at [place] and Agree with Plan of Care  Patient       Patient will benefit from skilled therapeutic intervention in order to improve the following deficits and impairments:  Pain, Hypermobility, Decreased range of motion, Decreased strength, Impaired UE functional use  Visit Diagnosis: Chronic right shoulder pain  Abnormal posture  Stiffness of right shoulder, not elsewhere classified  Lymphedema, not elsewhere classified     Problem List Patient Active Problem List   Diagnosis Date Noted  . Heart murmur 11/13/2017  . Genetic testing 01/25/2016  . Microscopic hematuria 09/06/2015  . Vaginal dryness 12/09/2014  . Family history of breast cancer   . Malignant neoplasm of upper-outer quadrant of right breast in female, estrogen receptor negative ([place]) 11/14/2014  . Intermittent palpitations   . Obesity (BMI 35.0-39.9 without comorbidity)   . Hyperlipidemia LDL goal <100 07/07/2011  . General medical examination 07/07/2011  . History of partial seizures 04/01/2011  . Asthma 04/01/2011  . Mitral valve prolapse 04/01/2011  . Hot flashes 04/01/2011  . Tobacco abuse 04/01/2011  . Essential hypertension 04/01/2011   Donato Heinz. Owens Shark PT  [place] Levo 08/07/2018, 6:28 PM  [place], [place], [address] Phone: [phone]   Fax:  [phone]  Name: Katie Jacobson MRN: [id number] Date of Birth: 11-11-1955

## 2018-08-08 ENCOUNTER — Ambulatory Visit: Payer: BLUE CROSS/BLUE SHIELD

## 2018-08-08 DIAGNOSIS — M25511 Pain in right shoulder: Principal | ICD-10-CM

## 2018-08-08 DIAGNOSIS — R293 Abnormal posture: Secondary | ICD-10-CM

## 2018-08-08 DIAGNOSIS — G8929 Other chronic pain: Secondary | ICD-10-CM

## 2018-08-08 DIAGNOSIS — I890 Lymphedema, not elsewhere classified: Secondary | ICD-10-CM

## 2018-08-08 DIAGNOSIS — M25611 Stiffness of right shoulder, not elsewhere classified: Secondary | ICD-10-CM

## 2018-08-08 NOTE — Therapy (Addendum)
[place], [place], [address] Phone: [phone]   Fax:  [phone]  Physical Therapy Treatment  Patient Details  Name: Katie Jacobson MRN: [id number] Date of Birth: 16-Sep-1956 Referring Provider (PT): Dr. Jana Hakim   Encounter Date: 08/08/2018  PT End of Session - 08/08/18 1533    Visit Number  10    Number of Visits  17    Date for PT Re-Evaluation  08/30/18    PT Start Time  9983    PT Stop Time  1529    PT Time Calculation (min)  52 min    Activity Tolerance  Patient tolerated treatment well    Behavior During Therapy  [facility] for tasks assessed/performed       Past Medical History:  Diagnosis Date  . Breast cancer of upper-outer quadrant of right female breast ([place]) 11/14/2014   finished chemo / over year  . Breast cancer, right breast ([place])   . History of chemotherapy    finished chemo 02/03/2015  . History of kidney stones   . History of seizures    as a child - unknown cause - states was never on anticonvulsants  . Hypertension    states under control with meds., has been on med. x "years"  . Mitral valve prolapse 1990  . Osteoporosis   . Personal history of chemotherapy   . Personal history of radiation therapy   . Seasonal allergies     Past Surgical History:  Procedure Laterality Date  . ABDOMINAL HYSTERECTOMY  1998   partial  . APPENDECTOMY  1976  . BREAST LUMPECTOMY Right 02/2015  . COLONOSCOPY    . KNEE ARTHROSCOPY Left 03/26/2008  . PLANTAR'S WART EXCISION Left    x 2  . PORT-A-CATH REMOVAL Left 04/06/2015   Procedure: REMOVAL PORT-A-CATH;  Surgeon: Rolm Bookbinder, MD;  Location: [place];  Service: General;  Laterality: Left;  . PORTACATH PLACEMENT N/A 11/27/2014   Procedure: INSERTION PORT-A-CATH;  Surgeon: Rolm Bookbinder, MD;  Location: [place];  Service: General;  Laterality: N/A;  . RADIOACTIVE SEED GUIDED PARTIAL MASTECTOMY  WITH AXILLARY SENTINEL LYMPH NODE BIOPSY Right 03/02/2015   Procedure: RADIOACTIVE SEED GUIDED RIGHT PARTIAL MASTECTOMY WITH RIGHT AXILLARY SENTINEL LYMPH NODE BIOPSY;  Surgeon: Rolm Bookbinder, MD;  Location: [place];  Service: General;  Laterality: Right;  . SALIVARY STONE REMOVAL Right 1972  . TONSILLECTOMY  1970s  . TUBAL LIGATION  1996    There were no vitals filed for this visit.  Subjective Assessment - 08/08/18 1444    Subjective  I don't know if it was the patch she gave me yesterday or the exercises but I didn't have the normal pain I've been having after these sessions.     Pertinent History  triple negative breast cancer s/p Rt lumpectomy in 2016 with neo-adjuvant chemotherapy and adjuvant radiation with history of treatment here.  osteoporosis     Patient Stated Goals  improve raising the arm overhead, hooking the bra, decrease the shoulder pain    Currently in Pain?  Yes    Pain Score  3     Pain Location  Shoulder    Pain Orientation  Right    Pain Descriptors / Indicators  Aching    Pain Type  Chronic pain    Pain Onset  More than a month ago    Pain Frequency  Intermittent    Aggravating Factors   moving it  Pain Relieving Factors  rest and tylenol                       [facility] Adult PT Treatment/Exercise - 08/08/18 0001      Shoulder Exercises: Seated   External Rotation  Strengthening;Both;15 reps;Theraband    Theraband Level (Shoulder External Rotation)  Level 1 (Yellow)    Other Seated Exercises  Elbows on mat, seated in chair, abducting elbows into mat isometrically 7x, 15 sec holds      Shoulder Exercises: Stretch   Corner Stretch  2 reps;10 seconds   in doorway     Iontophoresis   Type of Iontophoresis  Dexamethasone    Location  right shoulder at [facility] joint    Dose  76m/mL    Time  6 hours      Manual Therapy   Manual Therapy  Soft tissue mobilization;Passive ROM    Myofascial Release  prolonged holds at painful  trigger points to allow for myofascial release at various places at lateral scapula and posterior axilla     Passive ROM  in supine to Rt shoulder in direction of flexion and abduction; also with prolonged pressure to lateral pect during flexion             PT Education - 08/08/18 1532    Education Details  Scapular strength and pectoralis stretch    Person(s) Educated  Patient    Methods  Explanation;Demonstration;Handout    Comprehension  Verbalized understanding;Returned demonstration;Need further instruction          PT Long Term Goals - 08/02/18 1526      PT LONG TERM GOAL #1   Title  Pt will reduce Rt UE measurements to 38cm 10cm proximal to the olecranon and to 27.8 at the olecranon    Baseline  08/02/18- 29.5 at olecranon,  38.9 - 15 cm prox to olecranon    Time  4    Period  Weeks    Status  On-going      PT LONG TERM GOAL #2   Title  Pt will obtain appropriate compression for the Rt UE to maintain size     Baseline  08/02/18- pt just got Rx to get garments and plans to go get fitted    Time  4    Period  Weeks    Status  On-going      PT LONG TERM GOAL #3   Title  Pt will improve Rt shoulder ROM to at least flex: 145 and abd: 145 to improve mobility     Baseline  R 121 flexion, R 69 abd    Time  4    Period  Weeks    Status  On-going      PT LONG TERM GOAL #4   Title  Pt will be ind with final HEP for continue lymphedema care and shoulder mobility     Time  4    Period  Weeks    Status  On-going      PT LONG TERM GOAL #5   Title  Pt will decrease resting shoulder pain to 0/10     Baseline  08/02/18- pt reports this is a 3/10    Time  4    Period  Weeks    Status  On-going            Plan - 08/08/18 1533    Clinical Impression Statement  Continued with gentle manual therapy to Rt shoulder  which pt is very tentaive during and has trouble relaxing shoulder muscles. Progressed HEP to include scapular strength and pectoralis stretch which she did  tolerate well with little to no pain. Also continued iontophoresis patch as pt reported having no pain after yesterdays session (she normally has increased pain later after therapy for awhile). Discussed with pt if this treatment not beneficial may need to consider seeing an orthopedic MD.     Rehab Potential  Good    Clinical Impairments Affecting Rehab Potential  pain level     PT Frequency  2x / week    PT Duration  4 weeks    PT Treatment/Interventions  ADLs/Self Care Home Management;Therapeutic exercise;Patient/family education;Passive range of motion;Manual lymph drainage;Manual techniques;Compression bandaging;Taping;Electrical Stimulation;Iontophoresis 19m/ml Dexamethasone    PT Next Visit Plan  Cont ionto for Rt shoulder pain; continue PROM to Rt shoulder as well as soft tissue work and do MLD as needed, see how [facility] at [facility]    PAns[place]code [email or web address]     Consulted and Agree with Plan of Care  Patient       Patient will benefit from skilled therapeutic intervention in order to improve the following deficits and impairments:  Pain, Hypermobility, Decreased range of motion, Decreased strength, Impaired UE functional use  Visit Diagnosis: Chronic right shoulder pain  Abnormal posture  Stiffness of right shoulder, not elsewhere classified  Lymphedema, not elsewhere classified     Problem List Patient Active Problem List   Diagnosis Date Noted  . Heart murmur 11/13/2017  . Genetic testing 01/25/2016  . Microscopic hematuria 09/06/2015  . Vaginal dryness 12/09/2014  . Family history of breast cancer   . Malignant neoplasm of upper-outer quadrant of right breast in female, estrogen receptor negative [place] 11/14/2014  . Intermittent palpitations   . Obesity (BMI 35.0-39.9 without comorbidity)   . Hyperlipidemia LDL goal <100 07/07/2011  . General medical examination 07/07/2011  . History of partial seizures 04/01/2011  . Asthma  04/01/2011  . Mitral valve prolapse 04/01/2011  . Hot flashes 04/01/2011  . Tobacco abuse 04/01/2011  . Essential hypertension 04/01/2011    ROtelia Limes PTA 08/08/2018, 3:38 PM  [place] [address]Phone: [phone]  Fax:  [phone] Name: LJAMARIS BIERNATMRN: [id number] of Birth: 1February 15, 1957 PHYSICAL THERAPY DISCHARGE SUMMARY  Visits from Start of Care: 10  Current functional level related to goals / functional outcomes: See above. She did not return to therapy so status is unknown.   Remaining deficits: Unknown.   Education / Equipment: HEP Plan: Patient agrees to discharge.  Patient goals were partially met. Patient is being discharged due to not returning since the last visit.  ?????         MAnnia Friendly PVirginia01/06/20 4:20 PM

## 2018-08-08 NOTE — Patient Instructions (Signed)
Access Code: [email or web address]  URL: [email or web address]  Date: 08/08/2018  Prepared by: Collie Siad   Exercises  Standing Shoulder External Rotation with Resistance - 10 reps - 2 sets - 3 hold - 1x daily - 4x weekly  Doorway Pec Stretch at 60 Elevation - 5 reps - 20 hold - 1x daily - 7x weekly

## 2018-08-10 ENCOUNTER — Encounter: Payer: Self-pay | Admitting: Family

## 2018-08-10 ENCOUNTER — Ambulatory Visit (INDEPENDENT_AMBULATORY_CARE_PROVIDER_SITE_OTHER): Payer: BLUE CROSS/BLUE SHIELD | Admitting: Family

## 2018-08-10 VITALS — BP 147/67 | HR 74 | Temp 98.3°F | Resp 16 | Ht 61.0 in | Wt 230.0 lb

## 2018-08-10 DIAGNOSIS — Z0000 Encounter for general adult medical examination without abnormal findings: Secondary | ICD-10-CM | POA: Diagnosis not present

## 2018-08-10 DIAGNOSIS — Z23 Encounter for immunization: Secondary | ICD-10-CM

## 2018-08-10 DIAGNOSIS — Z853 Personal history of malignant neoplasm of breast: Secondary | ICD-10-CM

## 2018-08-10 DIAGNOSIS — E348 Other specified endocrine disorders: Secondary | ICD-10-CM

## 2018-08-10 LAB — URINALYSIS, ROUTINE W REFLEX MICROSCOPIC
Bacteria, UA: NONE SEEN /HPF
Bilirubin Urine: NEGATIVE
GLUCOSE, UA: NEGATIVE
HYALINE CAST: NONE SEEN /LPF
Leukocytes, UA: NEGATIVE
Nitrite: NEGATIVE
PH: 5.5 (ref 5.0–8.0)
Protein, ur: NEGATIVE
SPECIFIC GRAVITY, URINE: 1.024 (ref 1.001–1.03)
WBC, UA: NONE SEEN /HPF (ref 0–5)

## 2018-08-10 LAB — LIPID PANEL
CHOL/HDL RATIO: 2.8 (calc) (ref <5.0)
Cholesterol: 209 mg/dL — ABNORMAL HIGH (ref <200)
HDL: 76 mg/dL (ref >50)
LDL CHOLESTEROL (CALC): 116 mg/dL — AB
Non-HDL Cholesterol (Calc): 133 mg/dL (calc) — ABNORMAL HIGH (ref <130)
Triglycerides: 73 mg/dL (ref <150)

## 2018-08-10 LAB — CBC WITH DIFFERENTIAL/PLATELET
BASOS PCT: 0.8 %
Basophils Absolute: 52 cells/uL (ref 0–200)
EOS ABS: 163 {cells}/uL (ref 15–500)
Eosinophils Relative: 2.5 %
HEMATOCRIT: 42 % (ref 35.0–45.0)
Hemoglobin: 14.4 g/dL (ref 11.7–15.5)
LYMPHS ABS: 2477 {cells}/uL (ref 850–3900)
MCH: 31.6 pg (ref 27.0–33.0)
MCHC: 34.3 g/dL (ref 32.0–36.0)
MCV: 92.3 fL (ref 80.0–100.0)
MPV: 12.3 fL (ref 7.5–12.5)
Monocytes Relative: 7.3 %
Neutro Abs: 3335 cells/uL (ref 1500–7800)
Neutrophils Relative %: 51.3 %
Platelets: 235 10*3/uL (ref 140–400)
RBC: 4.55 10*6/uL (ref 3.80–5.10)
RDW: 14.1 % (ref 11.0–15.0)
Total Lymphocyte: 38.1 %
WBC: 6.5 10*3/uL (ref 3.8–10.8)
WBCMIX: 475 {cells}/uL (ref 200–950)

## 2018-08-10 LAB — HEPATIC FUNCTION PANEL
AG Ratio: 1.5 (calc) (ref 1.0–2.5)
ALBUMIN MSPROF: 4 g/dL (ref 3.6–5.1)
ALT: 14 U/L (ref 6–29)
AST: 15 U/L (ref 10–35)
Alkaline phosphatase (APISO): 71 U/L (ref 33–130)
BILIRUBIN INDIRECT: 0.4 mg/dL (ref 0.2–1.2)
BILIRUBIN TOTAL: 0.5 mg/dL (ref 0.2–1.2)
Bilirubin, Direct: 0.1 mg/dL (ref 0.0–0.2)
GLOBULIN: 2.7 g/dL (ref 1.9–3.7)
Total Protein: 6.7 g/dL (ref 6.1–8.1)

## 2018-08-10 LAB — BASIC METABOLIC PANEL
BUN: 13 mg/dL (ref 7–25)
CO2: 24 mmol/L (ref 20–32)
Calcium: 9.3 mg/dL (ref 8.6–10.4)
Chloride: 105 mmol/L (ref 98–110)
Creat: 0.93 mg/dL (ref 0.50–1.05)
GLUCOSE: 73 mg/dL (ref 65–99)
Potassium: 4.1 mmol/L (ref 3.5–5.3)
Sodium: 140 mmol/L (ref 135–146)

## 2018-08-10 LAB — TSH: TSH: 1.08 m[IU]/L (ref 0.40–4.50)

## 2018-08-10 MED ORDER — METOPROLOL SUCCINATE ER 50 MG PO TB24: 50.0000 mg | ORAL_TABLET | Freq: Every day | ORAL | 3 refills | Status: DC

## 2018-08-10 NOTE — Progress Notes (Signed)
Subjective:    Patient ID: Katie Jacobson, female    DOB: 11-23-1955, 62 y.o.   MRN: [id number]  HPI  Patient presents today for complete physical.  Immunizations: Shingrix today Diet: needs improvement Wt Readings from Last 3 Encounters:  08/10/18 230 lb (104.3 kg)  05/01/18 230 lb (104.3 kg)  12/28/17 232 lb 3.2 oz (105.3 kg)  Exercise:  Some line dancing  Colonoscopy: 09/30/16, due 09/2019 Dexa:  2016  Pap Smear: hysterectomy Mammogram: due 11/2018 Vision:  Up to date Dental: up to date (got a new partial dental)     Review of Systems  Constitutional: Negative for unexpected weight change.  HENT: Negative for hearing loss and rhinorrhea.   Eyes: Negative for visual disturbance.  Respiratory: Negative for cough.   Cardiovascular: Negative for leg swelling.  Gastrointestinal: Negative for constipation and diarrhea.  Genitourinary: Negative for dysuria, frequency and hematuria.  Musculoskeletal:       Knees/arm joint pain  Neurological: Negative for headaches.  Hematological: Negative for adenopathy.  Psychiatric/Behavioral:       Denies current depression issues   Past Medical History:  Diagnosis Date  . Breast cancer of upper-outer quadrant of right female breast ([place]) 11/14/2014   finished chemo / over year  . Breast cancer, right breast ([place])   . History of chemotherapy    finished chemo 02/03/2015  . History of kidney stones   . History of seizures    as a child - unknown cause - states was never on anticonvulsants  . Hypertension    states under control with meds., has been on med. x "years"  . Mitral valve prolapse 1990  . Osteoporosis   . Personal history of chemotherapy   . Personal history of radiation therapy   . Seasonal allergies      Social History   Socioeconomic History  . Marital status: Married    Spouse name: Not on file  . Number of children: 2  . Years of education: Not on file  . Highest education level: Not on file    Occupational History  . Not on file  Social Needs  . Financial resource strain: Not on file  . Food insecurity:    Worry: Not on file    Inability: Not on file  . Transportation needs:    Medical: Not on file    Non-medical: Not on file  Tobacco Use  . Smoking status: Former Smoker    Packs/day: 0.50    Years: 43.00    Pack years: 21.50    Types: Cigarettes  . Smokeless tobacco: Former Systems developer    Quit date: 09/09/2016  . Tobacco comment: 10 cig./day  Substance and Sexual Activity  . Alcohol use: Yes    Alcohol/week: 1.0 - 4.0 standard drinks    Types: 1 - 4 Standard drinks or equivalent per week  . Drug use: No  . Sexual activity: Not on file  Lifestyle  . Physical activity:    Days per week: Not on file    Minutes per session: Not on file  . Stress: Not on file  Relationships  . Social connections:    Talks on phone: Not on file    Gets together: Not on file    Attends religious service: Not on file    Active member of club or organization: Not on file    Attends meetings of clubs or organizations: Not on file    Relationship status: Not on file  . Intimate  partner violence:    Fear of current or ex partner: Not on file    Emotionally abused: Not on file    Physically abused: Not on file    Forced sexual activity: Not on file  Other Topics Concern  . 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  Has been doing part time work with [employer] -- had 6 active clients, but the most important one is her Daughter.   Regular exercise:  No   Caffeine Use: none; Current 1ppd smoker x > 39 yrs; EtOH ~4 oz/week    Past Surgical History:  Procedure Laterality Date  . ABDOMINAL HYSTERECTOMY  1998   partial  . APPENDECTOMY  1976  . BREAST LUMPECTOMY Right 02/2015  . COLONOSCOPY    . KNEE ARTHROSCOPY Left 03/26/2008  .  PLANTAR'S WART EXCISION Left    x 2  . PORT-A-CATH REMOVAL Left 04/06/2015   Procedure: REMOVAL PORT-A-CATH;  Surgeon: Rolm Bookbinder, MD;  Location: [facility];  Service: General;  Laterality: Left;  . PORTACATH PLACEMENT N/A 11/27/2014   Procedure: INSERTION PORT-A-CATH;  Surgeon: Rolm Bookbinder, MD;  Location: [facility];  Service: General;  Laterality: N/A;  . RADIOACTIVE SEED GUIDED PARTIAL MASTECTOMY WITH AXILLARY SENTINEL LYMPH NODE BIOPSY Right 03/02/2015   Procedure: RADIOACTIVE SEED GUIDED RIGHT PARTIAL MASTECTOMY WITH RIGHT AXILLARY SENTINEL LYMPH NODE BIOPSY;  Surgeon: Rolm Bookbinder, MD;  Location: [facility];  Service: General;  Laterality: Right;  . SALIVARY STONE REMOVAL Right 1971-01-11  . TONSILLECTOMY  1970s  . TUBAL LIGATION  1996    Family History  Problem Relation Age of Onset  . Heart disease Mother   . Stroke Mother   . Hypertension Mother   . Diabetes Mother   . Diabetes Sister   . Kidney disease Brother   . Mental retardation Brother   . Diabetes Sister   . Heart attack Father   . Breast cancer Cousin        deceased 67    Allergies  Allergen Reactions  . Sulfa Antibiotics Other (See Comments)    UNKNOWN    Current Outpatient Medications on File Prior to Visit  Medication Sig Dispense Refill  . albuterol (PROVENTIL HFA;VENTOLIN HFA) 108 (90 BASE) MCG/ACT inhaler Inhale into the lungs every 6 (six) hours as needed for wheezing or shortness of breath.    Marland Kitchen amoxicillin (AMOXIL) 500 MG capsule Take 2 grams (4 pills) prior to dental procedure 12 capsule 0  . aspirin 81 MG tablet Take 1 tablet (81 mg total) by mouth daily. 30 tablet 0  . chlorthalidone (HYGROTON) 25 MG tablet TAKE 1 TABLET BY MOUTH EVERY OTHER DAY 45 tablet 3  . enalapril (VASOTEC) 20 MG tablet TAKE 1 TABLET BY MOUTH EVERY NIGHT AT BEDTIME 90 tablet 3  . fluticasone (FLONASE) 50 MCG/ACT nasal spray Place 1 spray into both nostrils as needed.      . metoprolol tartrate (LOPRESSOR) 25 MG tablet TAKE 1 TABLET(25 MG) BY MOUTH TWICE DAILY 180 tablet 3  . Potassium 99 MG TABS Take 1 tablet (99 mg total) by mouth daily as needed.    . promethazine-dextromethorphan (PROMETHAZINE-DM) 6.25-15 MG/5ML syrup Take 5 mLs by mouth 4 (four) times daily as needed for cough. 118 mL 0   No current facility-administered  medications on file prior to visit.     BP (!) 147/67 (BP Location: Left Arm, Patient Position: Sitting, Cuff Size: Large)   Pulse 74   Temp 98.3 F (36.8 C) (Oral)   Resp 16   Ht 5\' 1"  (1.549 m)   Wt 230 lb (104.3 kg)   LMP 10/10/1996   SpO2 100%   BMI 43.46 kg/m       Objective:   Physical Exam  Physical Exam  Constitutional: She is oriented to person, place, and time. She appears well-developed and well-nourished. No distress.  HENT:  Head: Normocephalic and atraumatic.  Right Ear: Tympanic membrane and ear canal normal.  Left Ear: Tympanic membrane and ear canal normal.  Mouth/Throat: Oropharynx is clear and moist.  Eyes: Pupils are equal, round, and reactive to light. No scleral icterus.  Neck: Normal range of motion. No thyromegaly present.  Cardiovascular: Normal rate and regular rhythm.   No murmur heard. Pulmonary/Chest: Effort normal and breath sounds normal. No respiratory distress. He has no wheezes. She has no rales. She exhibits no tenderness.  Abdominal: Soft. Bowel sounds are normal. She exhibits no distension and no mass. There is no tenderness. There is no rebound and no guarding.  Musculoskeletal: She exhibits no edema.  Lymphadenopathy:    She has no cervical adenopathy. mild RUE lymphedema Neurological: She is alert and oriented to person, place, and time. She has normal patellar reflexes. She exhibits normal muscle tone. Coordination normal.  Skin: Skin is warm and dry.  Psychiatric: She has a normal mood and affect. Her behavior is normal. Judgment and thought content normal.  Breasts: Examined  lying Right: Without masses, retractions, discharge or axillary adenopathy.  Left: Without masses, retractions, discharge or axillary adenopathy. Pelvic: deferred.            Assessment & Plan:         Assessment & Plan:  Preventative care- discussed healthy diet, exercise, weight loss.  EKG tracing is personally reviewed.  EKG notes NSR.  No acute changes. Shingrix #1 today.  Mammo colo, flu shot, tetanus up to date. Discussed diet/exercise/weight loss.   BP Readings from Last 3 Encounters:  08/10/18 (!) 147/67  05/01/18 140/62  12/28/17 (!) 146/79

## 2018-08-10 NOTE — Patient Instructions (Addendum)
Please complete lab work prior to leaving. Continue to work on [employer], exercise and weight loss. Change metoprolol to toprol xl 50mg  once daily.

## 2018-08-12 ENCOUNTER — Encounter: Payer: Self-pay | Admitting: Family

## 2018-08-13 ENCOUNTER — Telehealth: Payer: Self-pay | Admitting: Physical Therapy

## 2018-08-13 NOTE — Telephone Encounter (Signed)
Pt. phoned the clinic on 08/13/18. She was confused and frustrated that she has now received 2 lymphedema pumps - the Flexitouch and the Lymphapress from 2 different companies. She doesn't know which one she should keep and does not understand why 2 companies received her information from [place]. I told her I would look into it and call her back. After talking with the therapists who have treated her and reading her chart notes, it appears the flexitouch was ordered by 1 PT and then the lymphapress was ordered by another PT. I contacted the pt back and explained the situation and gave her the choice of which she would prefer. After she chose the Flexitouch, I phoned Shaune Spittle, the rep from [facility] and explained the situation. He stated he would have Melissa from the shipping department call her to have the pump picked up. Phoned the pt back and explained that Melissa from shipping would call her with instructions for returning the pump and she was satisfied with that. Annia Friendly, [place] 08/13/18 3:52 PM

## 2018-08-17 ENCOUNTER — Telehealth: Payer: Self-pay | Admitting: Cardiology

## 2018-08-17 MED ORDER — CHLORTHALIDONE 25 MG PO TABS: 25.0000 mg | ORAL_TABLET | ORAL | 0 refills | Status: DC

## 2018-08-17 MED ORDER — ENALAPRIL MALEATE 20 MG PO TABS: 20.0000 mg | ORAL_TABLET | Freq: Every day | ORAL | 0 refills | Status: DC

## 2018-08-17 NOTE — Telephone Encounter (Signed)
° ° °  Pt c/o medication issue:  1. Name of Medication: metoprolol succinate (TOPROL-XL) 50 MG 24 hr tablet  2. How are you currently taking this medication (dosage and times per day)? As written   3. Are you having a reaction (difficulty breathing--STAT)? n/a  4. What is your medication issue? PCP made changes, patient wants to discuss

## 2018-08-17 NOTE — Telephone Encounter (Signed)
Rx request sent to pharmacy.  

## 2018-08-17 NOTE — Telephone Encounter (Signed)
07-27-15 progress note [facility]):   Heart palpitations (Chronic) Well-controlled on beta blocker. Apparently her insurance company has changed and she is now having issues with the co-pay for Toprol. Therefore we will switch her to Lopressor 25 mg twice a day  Returned call to pt she states that her PCP changed her metoprolol from the tartrate to the succinate. She states that she remembers that she had already tried this once before but does not remember why she switched back to the succinate.  Upon review of chart, she stopped the succinate due to insurance coverage(see note excerpt above). Returned call to pt and informed her of the insurance reason. She states that she has different insurance now, I informed pt that if she has the succinate in hand it is covered by her insurance. She states that she have a lot of the tartrate on hand and would like to finish what she has on hand. She states that she will call back when she decides to switch to the succinate, for now she will continue the tartrate. She will Cb when she decides to change.

## 2018-08-17 NOTE — Telephone Encounter (Signed)
° ° °*  STAT* If patient is at the pharmacy, call can be transferred to refill team.   1. Which medications need to be refilled? (please list name of each medication and dose if known) chlorthalidone (HYGROTON) 25 MG tablet, enalapril (VASOTEC) 20 MG tablet,   2. Which pharmacy/location (including street and city if local pharmacy) is medication to be sent to? [id number] - [place], [place] - [address]  3. Do they need a 30 day or 90 day supply? [place]

## 2018-09-10 ENCOUNTER — Ambulatory Visit: Payer: BLUE CROSS/BLUE SHIELD | Admitting: Family

## 2018-09-12 ENCOUNTER — Ambulatory Visit (INDEPENDENT_AMBULATORY_CARE_PROVIDER_SITE_OTHER): Payer: BLUE CROSS/BLUE SHIELD

## 2018-09-12 ENCOUNTER — Ambulatory Visit: Payer: BLUE CROSS/BLUE SHIELD

## 2018-09-12 DIAGNOSIS — Z23 Encounter for immunization: Secondary | ICD-10-CM | POA: Diagnosis not present

## 2018-09-14 ENCOUNTER — Other Ambulatory Visit: Payer: Self-pay | Admitting: Family

## 2018-09-14 DIAGNOSIS — E2839 Other primary ovarian failure: Secondary | ICD-10-CM

## 2018-10-04 ENCOUNTER — Ambulatory Visit: Payer: Self-pay

## 2018-10-04 NOTE — Telephone Encounter (Signed)
She would need appt. OK to schedule her with me at 1130 tomorrow or with any provider available. TY.

## 2018-10-04 NOTE — Telephone Encounter (Signed)
Returned call to patient who called to request a renewal on her cough medication Promethazine-DM. Pt states that everyone in the family has been sick  Her symptoms started on Monday with a sore throat that has resolved.  She has no fever or nasal stuffiness.  Her worst symptom is a cough at night when she lays down. She has taken her promethazine-DM just at night.  She is out now and would like it refilled.Home care advice read to patient. Pt was advised that she my need appointment for refill. Pt verbalized understanding of all instructions. Pt request refill sent to [facility].  Reason for Disposition . Cough with cold symptoms (e.g., runny nose, postnasal drip, throat clearing)  Answer Assessment - Initial Assessment Questions 1. ONSET: "When did the cough begin?"      Monday started with sore throat 2. SEVERITY: "How bad is the cough today?"      Bad at night 3. RESPIRATORY DISTRESS: "Describe your breathing."      no 4. FEVER: "Do you have a fever?" If so, ask: "What is your temperature, how was it measured, and when did it start?"     no 5. SPUTUM: "Describe the color of your sputum" (clear, white, yellow, green)     clear 6. HEMOPTYSIS: "Are you coughing up any blood?" If so ask: "How much?" (flecks, streaks, tablespoons, etc.)     no 7. CARDIAC HISTORY: "Do you have any history of heart disease?" (e.g., heart attack, congestive heart failure)      no 8. LUNG HISTORY: "Do you have any history of lung disease?"  (e.g., pulmonary embolus, asthma, emphysema)     no 9. PE RISK FACTORS: "Do you have a history of blood clots?" (or: recent major surgery, recent prolonged travel, bedridden)     no 10. OTHER SYMPTOMS: "Do you have any other symptoms?" (e.g., runny nose, wheezing, chest pain)       Sore throat but is resolving 11. PREGNANCY: "Is there any chance you are pregnant?" "When was your last menstrual period?"       N/A 12. TRAVEL: "Have you traveled out of the  country in the last month?" (e.g., travel history, exposures)       no  Protocols used: [place]

## 2018-10-05 NOTE — Telephone Encounter (Signed)
Notified pt. Pt states she does not want to make an appointment today.

## 2018-10-11 ENCOUNTER — Telehealth: Payer: Self-pay

## 2018-10-11 NOTE — Telephone Encounter (Addendum)
Good to know. Will check from this point to make sure patients appointments have been scheduled in correct timeframe.

## 2018-10-11 NOTE — Telephone Encounter (Signed)
I called the company and they said that CDC allows minimum dosing schedule of 30 days between doses.  She had >30 days.  No further follow up needed.

## 2018-10-11 NOTE — Telephone Encounter (Signed)
Upon review of immunization order form and Epic, it appears pt. Received 2nd dose of shingrix one month too early. Matter brought to author's attention by Marin Roberts, [place]. Routed to [facility], PCP to advise on need to contact patient, or provide other instruction.

## 2018-10-17 ENCOUNTER — Ambulatory Visit: Payer: BLUE CROSS/BLUE SHIELD

## 2018-11-02 ENCOUNTER — Telehealth: Payer: Self-pay | Admitting: *Deleted

## 2018-11-02 NOTE — Telephone Encounter (Signed)
Copied from [facility] [id number]. Topic: General - Other >> Nov 01, 2018  4:53 PM Percell Belt A wrote: Reason for CRM:  Pt called in and stated she found a knot on her arm with the lymphedema, she could not call her cancer doctor, her ins has changed.  She has just a few question for [facility]. She stated it was a long story but she would know.   Best number  [phone] Cell number  [phone]

## 2018-11-05 NOTE — Telephone Encounter (Signed)
Spoke to patient, she cannot see [place] anymore because we are out of network with her insurance.  What she is describing sounds like a ganglion cyst on the back of her right wrist. I suggested she consider evaluation in an urgent care if she is not able to get re-established with a primary care soon. Pt verbalizes understanding.

## 2018-11-08 ENCOUNTER — Telehealth: Payer: Self-pay | Admitting: Cardiology

## 2018-11-08 NOTE — Telephone Encounter (Signed)
Spoke with pt, she would like to know if dr harding knows a cardiologist and a primary care doctor at [facility] she can see? Aware dr Ellyn Hack is not here but will be back in touch with her.

## 2018-11-08 NOTE — Telephone Encounter (Signed)
New message    Patient would like to let Dr. Ellyn Hack know that her insurance changed and she  will need to find a provider with [facility].

## 2018-11-12 ENCOUNTER — Other Ambulatory Visit: Payer: BLUE CROSS/BLUE SHIELD

## 2018-11-12 NOTE — Telephone Encounter (Signed)
Pt calling to f/u on previous message.

## 2018-11-12 NOTE — Telephone Encounter (Signed)
Follow up    Pt wants updates on who she can see since her insurance changed

## 2018-11-12 NOTE — Telephone Encounter (Signed)
I do not know who for the [facility] cardiologist or PCPs are accepting new patients.  Most of the people that I knew were actually in training there and are now gone.  I do not know the attendings or staff.  Can ask around colleagues however.

## 2018-11-13 ENCOUNTER — Ambulatory Visit: Payer: BLUE CROSS/BLUE SHIELD | Admitting: Cardiology

## 2018-11-13 NOTE — Telephone Encounter (Signed)
PATIENT STATES SHE WAS JUST CALLING LAST WEEK TO STATE SHE WOULD HAVE TO CANCEL APPOINTMENTS FOR F/U AND ECHO  DUE TO THE FACT  [facility] IS NOT IN NETWORK WITH HER NEW  INSURANCE.    TODAY'S APPOINTMENT HAS BEEN CANCELLED. PATIENT IS AWARE OF DR HARDING'S COMMENTS

## 2018-11-13 NOTE — Telephone Encounter (Signed)
Left message to call back  

## 2018-12-27 ENCOUNTER — Telehealth: Payer: Self-pay | Admitting: Oncology

## 2018-12-27 ENCOUNTER — Other Ambulatory Visit: Payer: Self-pay | Admitting: Family

## 2018-12-27 ENCOUNTER — Telehealth: Payer: Self-pay

## 2018-12-27 NOTE — Telephone Encounter (Signed)
Spoke with patient, patient stated she is unsure if we are in her network with new insurance.  I encouraged patient to contact insurance company to obtain providers that are in network.  Pt voiced understanding.  Will contact us back if needed for referral or any additional questions.

## 2018-12-27 NOTE — Telephone Encounter (Signed)
R/s appt per 3/18 sch message - left message with updated date and time

## 2018-12-31 ENCOUNTER — Ambulatory Visit: Payer: BLUE CROSS/BLUE SHIELD | Admitting: Oncology

## 2018-12-31 ENCOUNTER — Other Ambulatory Visit: Payer: BLUE CROSS/BLUE SHIELD

## 2019-04-01 ENCOUNTER — Other Ambulatory Visit: Payer: Self-pay | Admitting: Cardiology

## 2019-05-06 ENCOUNTER — Inpatient Hospital Stay: Payer: Self-pay

## 2019-05-06 ENCOUNTER — Inpatient Hospital Stay: Payer: Self-pay | Admitting: Oncology

## 2019-05-07 ENCOUNTER — Other Ambulatory Visit: Payer: Self-pay | Admitting: *Deleted

## 2019-05-07 MED ORDER — METOPROLOL SUCCINATE ER 50 MG PO TB24: ORAL_TABLET | ORAL | 0 refills | Status: DC

## 2019-06-14 ENCOUNTER — Other Ambulatory Visit: Payer: Self-pay | Admitting: *Deleted

## 2019-06-14 MED ORDER — METOPROLOL SUCCINATE ER 50 MG PO TB24: ORAL_TABLET | ORAL | 0 refills | Status: DC

## 2019-06-21 ENCOUNTER — Telehealth: Payer: Self-pay | Admitting: Cardiology

## 2019-06-21 MED ORDER — METOPROLOL SUCCINATE ER 50 MG PO TB24: ORAL_TABLET | ORAL | 2 refills | Status: DC

## 2019-06-21 NOTE — Telephone Encounter (Signed)
LVM letting patient know that I refilled her Metoprolol and that she needed to schedule an appointment with Dr. Ellyn Hack so that she can continue to get her medications refilled.

## 2019-06-21 NOTE — Telephone Encounter (Signed)
New message      *STAT* If patient is at the pharmacy, call can be transferred to refill team.   1. Which medications need to be refilled? (please list name of each medication and dose if known) metoprolol succinate (TOPROL-XL) 50 MG 24 hr tablet  2. Which pharmacy/location (including street and city if local pharmacy) is medication to be sent to?[facility] AY:[id number] - [place], [place] - [address]  3. Do they need a 30 day or 90 day supply? [place]

## 2019-06-26 ENCOUNTER — Telehealth: Payer: Self-pay | Admitting: *Deleted

## 2019-06-26 ENCOUNTER — Other Ambulatory Visit: Payer: Self-pay | Admitting: *Deleted

## 2019-06-26 MED ORDER — METOPROLOL SUCCINATE ER 50 MG PO TB24: ORAL_TABLET | ORAL | 2 refills | Status: DC

## 2019-06-26 NOTE — Telephone Encounter (Signed)
Called patient to let her know that I resent refills for her Metoprolol as well as followed up with the pharmacy to ensure hat they received the electronic refill this time.

## 2019-07-09 ENCOUNTER — Other Ambulatory Visit: Payer: Self-pay | Admitting: Cardiology

## 2019-07-26 DIAGNOSIS — E66813 Obesity, class 3: Secondary | ICD-10-CM | POA: Insufficient documentation

## 2019-07-27 DIAGNOSIS — F17210 Nicotine dependence, cigarettes, uncomplicated: Secondary | ICD-10-CM | POA: Insufficient documentation

## 2019-07-29 DIAGNOSIS — E559 Vitamin D deficiency, unspecified: Secondary | ICD-10-CM | POA: Insufficient documentation

## 2019-07-30 ENCOUNTER — Telehealth: Payer: Self-pay

## 2019-07-30 NOTE — Telephone Encounter (Signed)
RN returned call, voicemail left for return call.  

## 2019-09-09 ENCOUNTER — Encounter: Payer: Self-pay | Admitting: Gastroenterology

## 2019-09-11 ENCOUNTER — Telehealth: Payer: Self-pay | Admitting: Oncology

## 2019-09-11 NOTE — Telephone Encounter (Signed)
Confirmed January appointment with patient.

## 2019-09-16 ENCOUNTER — Encounter: Payer: Self-pay | Admitting: Gastroenterology

## 2019-10-01 ENCOUNTER — Other Ambulatory Visit: Payer: Self-pay

## 2019-10-01 ENCOUNTER — Ambulatory Visit (AMBULATORY_SURGERY_CENTER): Payer: BLUE CROSS/BLUE SHIELD | Admitting: *Deleted

## 2019-10-01 VITALS — Temp 97.3°F | Ht 61.0 in | Wt 218.0 lb

## 2019-10-01 DIAGNOSIS — Z86010 Personal history of colonic polyps: Secondary | ICD-10-CM

## 2019-10-01 DIAGNOSIS — Z1159 Encounter for screening for other viral diseases: Secondary | ICD-10-CM

## 2019-10-01 MED ORDER — NA SULFATE-K SULFATE-MG SULF 17.5-3.13-1.6 GM/177ML PO SOLN: ORAL | 0 refills | Status: DC

## 2019-10-01 NOTE — Progress Notes (Signed)
Patient is here in-person for PV. Patient denies any allergies to eggs or soy. Patient denies any problems with anesthesia/sedation. Patient denies any oxygen use at home. Patient denies taking any diet/weight loss medications or blood thinners. Patient is not being treated for MRSA or C-diff. EMMI education assisgned to the patient for the procedure, this was explained and instructions given to patient. COVID-19 screening test is on 1/5, the pt is aware. Pt is aware that care partner will wait in the car during procedure; if they feel like they will be too hot or cold to wait in the car; they may wait in the 4 th floor lobby. Patient is aware to bring only one care partner. We want them to wear a mask (we do not have any that we can provide them), practice social distancing, and we will check their temperatures when they get here.  I did remind the patient that their care partner needs to stay in the parking lot the entire time and have a cell phone available, we will call them when the pt is ready for discharge. Patient will wear mask into building.    Suprep $15 off coupon given to the patient.

## 2019-10-13 NOTE — Progress Notes (Signed)
[facility]  [phone] [phone]) [phone]     ID: Katie Jacobson DOB: 1956/04/26  MR#: [id number]  DGU#:[id number]  Patient Care Team: Yolanda Manges, DO as PCP - General (Internal Medicine) Marykay Lex, MD as PCP - Cardiology (Cardiology) Miguel Aschoff, MD (Inactive) (Obstetrics and Gynecology) Emelia Loron, MD as Consulting Physician (General Surgery) Domenico Achord, Valentino Hue, MD as Consulting Physician (Oncology) Lonie Peak, MD as Attending Physician (Radiation Oncology) Donnelly Angelica, RN as Registered Nurse Pershing Proud, RN as Registered Nurse [facility], Willaim Rayas, MD as Consulting Physician (Gastroenterology) OTHER MD:  CHIEF COMPLAINT: Triple negative breast cancer  CURRENT TREATMENT: observation   INTERVAL HISTORY: Katie Jacobson returns today for follow-up of her triple negative breast cancer. She continues on observation. She was last seen here in 12/2017.  She has changed her PCP to Dr. Andrey [facility] at [facility] due to insurance changes.  Since her last visit, she underwent bilateral diagnostic mammography with tomography at [facility] on 08/02/2019 showing: breast density category B; right breast calcifications at the lumpectomy site likely dystrophic related to prior surgery and likely benign; the lumpectomy site in the right breast is otherwise stable; no mammographic evidence of malignancy in bilateral breasts. A repeat right diagnostic mammogram was recommended in 6 months.  Her vitamin D 25 hydroxy level at [facility] was 12 on 07/26/2019.  Other labs same day showed normal liver function tests and a creatinine of 0.84.  White cell count hemoglobin and platelets were all normal.  REVIEW OF SYSTEMS: Katie Jacobson continues to have some very mixed feelings about her cancer situation.  There are also some difficulties at home.  We discussed all that at length.  She is appreciated grief counseling through hospice but being there has brought too  many memories so she would be interested in a different venue if available.  She is not able to work at present.  She is very short of breath and just walking up the hill from the parking lot to the cancer center was almost more than she could do.  There are also balance problems.  She also has become very forgetful.  She wonders if any of this is related to the chemotherapy she received.  They are keeping appropriate pandemic precautions.  A detailed review of systems today was otherwise stable.   BREAST CANCER HISTORY: From the original intake note:  The patient herself palpated a mass in her right breast there is she brought it to her gynecologist, Dr. Charlott Rakes attention and he obtained screening mammography which suggested an abnormality in the right breast. On 10/24/2014 the patient underwent right diagnostic mammography and ultrasonography at the breast Center. The breast density was category B. There was an ill-defined mass in the upper outer quadrant of the right breast which was palpable on exam. By ultrasound this was irregular and hypoechoic measuring 1.2 cm. Evaluation of the right axilla was negative.  Biopsy of the mass in question showed an invasive ductal carcinoma, grade 2 or 3, triple negative, with an MIB-1 of 19%.  Ms. Mochel case was presented at the multidisciplinary breast cancer conference 11/19/2014, where was suggested she would benefit from genetics testing. Since this would mandate a significant delay in her final surgery, neoadjuvant chemotherapy was indicated.  The patient's subsequent history is as detailed below   PAST MEDICAL HISTORY: Past Medical History:  Diagnosis Date  . Allergy   . Breast cancer of upper-outer quadrant of right female breast (HCC) 11/14/2014  finished chemo / over year  . Breast cancer, right breast (HCC)   . History of chemotherapy    finished chemo 02/03/2015  . History of kidney stones   . History of seizures    as a child - unknown  cause - states was never on anticonvulsants  . Hypertension    states under control with meds., has been on med. x "years"  . Mitral valve prolapse 1990  . Osteoporosis   . Personal history of chemotherapy   . Personal history of radiation therapy   . Seasonal allergies   . Seizures (HCC)    as a child-none as a adult    PAST SURGICAL HISTORY: Past Surgical History:  Procedure Laterality Date  . ABDOMINAL HYSTERECTOMY  1998   partial  . APPENDECTOMY  1976  . BREAST LUMPECTOMY Right 02/2015  . COLONOSCOPY  09/30/2016  . KNEE ARTHROSCOPY Left 03/26/2008  . PLANTAR'S WART EXCISION Left    x 2  . PORT-A-CATH REMOVAL Left 04/06/2015   Procedure: REMOVAL PORT-A-CATH;  Surgeon: Emelia Loron, MD;  Location: [facility];  Service: General;  Laterality: Left;  . PORTACATH PLACEMENT N/A 11/27/2014   Procedure: INSERTION PORT-A-CATH;  Surgeon: Emelia Loron, MD;  Location: [facility];  Service: General;  Laterality: N/A;  . RADIOACTIVE SEED GUIDED PARTIAL MASTECTOMY WITH AXILLARY SENTINEL LYMPH NODE BIOPSY Right 03/02/2015   Procedure: RADIOACTIVE SEED GUIDED RIGHT PARTIAL MASTECTOMY WITH RIGHT AXILLARY SENTINEL LYMPH NODE BIOPSY;  Surgeon: Emelia Loron, MD;  Location: [facility];  Service: General;  Laterality: Right;  . SALIVARY STONE REMOVAL Right 1972  . TONSILLECTOMY  1970s  . TUBAL LIGATION  1996    FAMILY HISTORY Family History  Problem Relation Age of Onset  . Heart disease Mother   . Stroke Mother   . Hypertension Mother   . Diabetes Mother   . Diabetes Sister   . Kidney disease Brother   . Mental retardation Brother   . Diabetes Sister   . Heart attack Father   . Breast cancer Cousin        deceased 59  . Colon polyps Daughter   . Colon cancer Neg Hx   . Esophageal cancer Neg Hx   . Rectal cancer Neg Hx   . Stomach cancer Neg Hx    the patient knows little about her father. Her mother died at at the age of 63  following a stroke. The patient had 2 brothers, 2 sisters. The only history of breast or ovarian cancer in the family is a first cousin on the mother's side diagnosed with breast cancer at age 10   GYNECOLOGIC HISTORY:  Patient's last menstrual period was 10/10/1996. Menarche age 68, first live birth age 70. The patient is GX P2. She status post simple hysterectomy without salpingo-oophorectomy. She did not take hormone replacement.   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 The patient's husband, Windy Fast "Ron" Shimon works for [employer] in the pattern shop. He also works as a Geologist, engineering. Daughter Lorilyn Moncayo lives in [place] and works in Presenter, broadcasting. Daughter [name] Kirschbaum died in an AA 09-17-2016.    ADVANCED DIRECTIVES: Not in place   HEALTH MAINTENANCE: Social History   Tobacco Use  . Smoking status: Current Every Day Smoker    Packs/day: 0.50    Years: 43.00    Pack years: 21.50    Types: Cigarettes  . Smokeless tobacco:  Former Neurosurgeon    Quit date: 09/09/2016  . Tobacco comment: 10 cig./day  Substance Use Topics  . Alcohol use: Yes    Alcohol/week: 5.0 standard drinks    Types: 5 Standard drinks or equivalent per week  . Drug use: No     Colonoscopy: 2012 ([facility]), repeat January 2021  PAP:  Bone density:  Lipid panel:  Allergies  Allergen Reactions  . Sulfa Antibiotics Other (See Comments)    UNKNOWN    Current Outpatient Medications  Medication Sig Dispense Refill  . albuterol (PROVENTIL HFA;VENTOLIN HFA) 108 (90 BASE) MCG/ACT inhaler Inhale into the lungs every 6 (six) hours as needed for wheezing or shortness of breath.    Marland Kitchen aspirin 81 MG tablet Take 1 tablet (81 mg total) by mouth daily. 30 tablet 0  . chlorthalidone (HYGROTON) 25 MG tablet TAKE 1 TABLET(25 MG) BY MOUTH EVERY OTHER DAY 45 tablet 0  . cholecalciferol (VITAMIN D3) 25 MCG (1000 UT) tablet Take 1 tablet (1,000 Units total) by mouth daily.     . enalapril (VASOTEC) 20 MG tablet Take 1 tablet (20 mg total) by mouth at bedtime. Please schedule appt for further refills, 1st attempt 90 tablet 0  . fluticasone (FLONASE) 50 MCG/ACT nasal spray Place 1 spray into both nostrils as needed.    . metoprolol succinate (TOPROL-XL) 50 MG 24 hr tablet NEED OV 30 tablet 2   No current facility-administered medications for this visit.    OBJECTIVE: Morbidly obese African-American woman who appears stated age  Vitals:   10/14/19 1522  BP: 134/73  Pulse: 84  Resp: 18  Temp: 98.7 F (37.1 C)  SpO2: 100%     Body mass index is 41.25 kg/m.    ECOG FS:1 - Symptomatic but completely ambulatory  Sclerae unicteric, EOMs intact Wearing a mask No cervical or supraclavicular adenopathy Lungs no rales or rhonchi Heart regular rate and rhythm Abd soft, nontender, positive bowel sounds MSK no focal spinal tenderness, no upper extremity lymphedema Neuro: nonfocal, well oriented, appropriate affect Breasts: The right breast is status post lumpectomy and radiation.  There is no evidence of local recurrence.  The left breast is unremarkable.  Both axillae are benign.   LAB RESULTS:  CMP     Component Value Date/Time   NA 140 08/10/2018 1507   NA 139 12/28/2016 1359   K 4.1 08/10/2018 1507   K 3.6 12/28/2016 1359   CL 105 08/10/2018 1507   CO2 24 08/10/2018 1507   CO2 25 12/28/2016 1359   GLUCOSE 73 08/10/2018 1507   GLUCOSE 85 12/28/2016 1359   BUN 13 08/10/2018 1507   BUN 15.6 12/28/2016 1359   CREATININE 0.93 08/10/2018 1507   CREATININE 0.9 12/28/2016 1359   CALCIUM 9.3 08/10/2018 1507   CALCIUM 9.7 12/28/2016 1359   PROT 6.7 08/10/2018 1507   PROT 7.5 12/28/2016 1359   ALBUMIN 3.4 (L) 12/28/2017 1342   ALBUMIN 3.7 12/28/2016 1359   AST 15 08/10/2018 1507   AST 14 12/28/2016 1359   ALT 14 08/10/2018 1507   ALT 17 12/28/2016 1359   ALKPHOS 81 12/28/2017 1342   ALKPHOS 86 12/28/2016 1359   BILITOT 0.5 08/10/2018 1507   BILITOT  0.35 12/28/2016 1359   GFRNONAA >60 12/28/2017 1342   GFRNONAA 70 09/02/2013 1128   GFRAA >60 12/28/2017 1342   GFRAA 81 09/02/2013 1128    INo results found for: SPEP, UPEP  Lab Results  Component Value Date   WBC 6.5 08/10/2018  NEUTROABS 3,335 08/10/2018   HGB 14.4 08/10/2018   HCT 42.0 08/10/2018   MCV 92.3 08/10/2018   PLT 235 08/10/2018      Chemistry      Component Value Date/Time   NA 140 08/10/2018 1507   NA 139 12/28/2016 1359   K 4.1 08/10/2018 1507   K 3.6 12/28/2016 1359   CL 105 08/10/2018 1507   CO2 24 08/10/2018 1507   CO2 25 12/28/2016 1359   BUN 13 08/10/2018 1507   BUN 15.6 12/28/2016 1359   CREATININE 0.93 08/10/2018 1507   CREATININE 0.9 12/28/2016 1359      Component Value Date/Time   CALCIUM 9.3 08/10/2018 1507   CALCIUM 9.7 12/28/2016 1359   ALKPHOS 81 12/28/2017 1342   ALKPHOS 86 12/28/2016 1359   AST 15 08/10/2018 1507   AST 14 12/28/2016 1359   ALT 14 08/10/2018 1507   ALT 17 12/28/2016 1359   BILITOT 0.5 08/10/2018 1507   BILITOT 0.35 12/28/2016 1359       No results found for: LABCA2  No components found for: LABCA125  No results for input(s): INR in the last 168 hours.  Urinalysis    Component Value Date/Time   COLORURINE YELLOW 08/10/2018 1507   APPEARANCEUR TURBID (A) 08/10/2018 1507   LABSPEC 1.024 08/10/2018 1507   PHURINE 5.5 08/10/2018 1507   GLUCOSEU NEGATIVE 08/10/2018 1507   GLUCOSEU NEGATIVE 09/06/2016 1225   HGBUR 1+ (A) 08/10/2018 1507   BILIRUBINUR NEGATIVE 09/06/2016 1225   KETONESUR 1+ (A) 08/10/2018 1507   PROTEINUR NEGATIVE 08/10/2018 1507   UROBILINOGEN 0.2 09/06/2016 1225   NITRITE NEGATIVE 08/10/2018 1507   LEUKOCYTESUR NEGATIVE 08/10/2018 1507    STUDIES: No results found.    ASSESSMENT: 64 y.o. [place], [place] woman status post right breast upper outer quadrant biopsy 11/12/2014 for a clinical T1c N0, stage IA invasive ductal carcinoma, triple negative, with an MIB-1 of  19%  (1) neoadjuvant chemotherapy consisting of cyclophosphamide and docetaxel 4 given every 21 days with Neulasta support, first dose 12/02/2014, completed 02/03/2015  (2) genetics testing through the [facility] gene panel at [facility] showed no deleterious mutations in ATM, BARD1, BRCA1, BRCA2, BRIP1, CDH1, CHEK2, MRE11A, MUTYH, NBN, NF1, PALB2, PTEN, RAD50, RAD51C, RAD51D, oe TP53.  (3) status post right lumpectomy and sentinel node sampling 03/02/2015 for a ypT1b ypN0 stage IA invasive ductal carcinoma, grade 1, with negative margins. Repeat HER-2 was again not amplified.  (4) radiation completed August 2016  (5) continuing tobacco abuse. The patient has been strongly advised to quit smoking   PLAN: Katie Jacobson will be 5 years out from definitive surgery [facility] 2021, with no evidence of active cancer.  This is very favorable.  There have been many twists and turns because of insurance but she feels she now has the medical support she needs.  She [facility] with [facility] and really likes her.  She is going to have her colonoscopy later this week under [facility].   She is accepting some of the things she cannot change in the situation at home.  She would like some more discussion and I am referring her to our chaplain with regards to that  I do not know whether the shortness of breath she has could possibly be due to chemo.  Generally the agents she received are not like anthracyclines and are not expected to be cardiotoxic.  She could have peripheral neuropathy from the docetaxel but she tells me that  is not the issue.  She is very short of breath, balance problems, and hands some forgetting issues.  Again it is hard to tell whether these could be related in a very general way to the treatment she received some years ago.  We reviewed her mammogram and it is favorable that she has a density of "B".  I offered her the possibility of graduation but she really likes  the idea of being seen here on a yearly basis at least for another year so she will see me in November 2021 after her October mammography.  I updated her medications and she is indeed taking vitamin D at present.  She knows to call for any other issue that may develop before the next visit here.  I spent 35 minutes total time in this encounter with Katie Jacobson, Valentino Hue, MD  10/14/19 3:46 PM Medical Oncology and Hematology [facility] [address] [place], [place] [address] Tel. [phone]    Fax. [phone]   I, Mickie Bail, am acting as scribe for Dr. Valentino Hue. Maurilio Puryear.  I, Ruthann Cancer MD, have reviewed the above documentation for accuracy and completeness, and I agree with the above.

## 2019-10-14 ENCOUNTER — Inpatient Hospital Stay: Payer: 59 | Attending: Oncology | Admitting: Oncology

## 2019-10-14 ENCOUNTER — Other Ambulatory Visit: Payer: Self-pay

## 2019-10-14 VITALS — BP 134/73 | HR 84 | Temp 98.7°F | Resp 18 | Ht 61.0 in | Wt 218.3 lb

## 2019-10-14 DIAGNOSIS — R0602 Shortness of breath: Secondary | ICD-10-CM | POA: Insufficient documentation

## 2019-10-14 DIAGNOSIS — Z923 Personal history of irradiation: Secondary | ICD-10-CM | POA: Diagnosis not present

## 2019-10-14 DIAGNOSIS — Z8371 Family history of colonic polyps: Secondary | ICD-10-CM | POA: Insufficient documentation

## 2019-10-14 DIAGNOSIS — Z171 Estrogen receptor negative status [ER-]: Secondary | ICD-10-CM | POA: Insufficient documentation

## 2019-10-14 DIAGNOSIS — F17210 Nicotine dependence, cigarettes, uncomplicated: Secondary | ICD-10-CM | POA: Diagnosis not present

## 2019-10-14 DIAGNOSIS — Z882 Allergy status to sulfonamides status: Secondary | ICD-10-CM | POA: Diagnosis not present

## 2019-10-14 DIAGNOSIS — Z79899 Other long term (current) drug therapy: Secondary | ICD-10-CM | POA: Insufficient documentation

## 2019-10-14 DIAGNOSIS — Z9221 Personal history of antineoplastic chemotherapy: Secondary | ICD-10-CM | POA: Diagnosis not present

## 2019-10-14 DIAGNOSIS — Z841 Family history of disorders of kidney and ureter: Secondary | ICD-10-CM | POA: Insufficient documentation

## 2019-10-14 DIAGNOSIS — Z823 Family history of stroke: Secondary | ICD-10-CM | POA: Insufficient documentation

## 2019-10-14 DIAGNOSIS — Z833 Family history of diabetes mellitus: Secondary | ICD-10-CM | POA: Insufficient documentation

## 2019-10-14 DIAGNOSIS — Z8249 Family history of ischemic heart disease and other diseases of the circulatory system: Secondary | ICD-10-CM | POA: Insufficient documentation

## 2019-10-14 DIAGNOSIS — R2689 Other abnormalities of gait and mobility: Secondary | ICD-10-CM | POA: Insufficient documentation

## 2019-10-14 DIAGNOSIS — Z87442 Personal history of urinary calculi: Secondary | ICD-10-CM | POA: Diagnosis not present

## 2019-10-14 DIAGNOSIS — Z803 Family history of malignant neoplasm of breast: Secondary | ICD-10-CM | POA: Diagnosis not present

## 2019-10-14 DIAGNOSIS — C50411 Malignant neoplasm of upper-outer quadrant of right female breast: Secondary | ICD-10-CM | POA: Insufficient documentation

## 2019-10-14 DIAGNOSIS — I10 Essential (primary) hypertension: Secondary | ICD-10-CM | POA: Insufficient documentation

## 2019-10-14 DIAGNOSIS — Z818 Family history of other mental and behavioral disorders: Secondary | ICD-10-CM | POA: Insufficient documentation

## 2019-10-15 ENCOUNTER — Ambulatory Visit (INDEPENDENT_AMBULATORY_CARE_PROVIDER_SITE_OTHER): Payer: 59

## 2019-10-15 ENCOUNTER — Other Ambulatory Visit: Payer: Self-pay

## 2019-10-15 ENCOUNTER — Encounter: Payer: Self-pay | Admitting: Family

## 2019-10-15 ENCOUNTER — Ambulatory Visit (INDEPENDENT_AMBULATORY_CARE_PROVIDER_SITE_OTHER): Payer: 59 | Admitting: Family

## 2019-10-15 ENCOUNTER — Other Ambulatory Visit: Payer: Self-pay | Admitting: Gastroenterology

## 2019-10-15 ENCOUNTER — Telehealth: Payer: Self-pay

## 2019-10-15 ENCOUNTER — Telehealth: Payer: Self-pay | Admitting: Oncology

## 2019-10-15 VITALS — BP 138/65 | HR 79 | Temp 96.5°F | Resp 18 | Ht 61.0 in | Wt 216.4 lb

## 2019-10-15 DIAGNOSIS — R634 Abnormal weight loss: Secondary | ICD-10-CM

## 2019-10-15 DIAGNOSIS — I10 Essential (primary) hypertension: Secondary | ICD-10-CM | POA: Diagnosis not present

## 2019-10-15 DIAGNOSIS — E785 Hyperlipidemia, unspecified: Secondary | ICD-10-CM

## 2019-10-15 DIAGNOSIS — M79674 Pain in right toe(s): Secondary | ICD-10-CM

## 2019-10-15 DIAGNOSIS — C50411 Malignant neoplasm of upper-outer quadrant of right female breast: Secondary | ICD-10-CM

## 2019-10-15 DIAGNOSIS — Z1159 Encounter for screening for other viral diseases: Secondary | ICD-10-CM

## 2019-10-15 DIAGNOSIS — M109 Gout, unspecified: Secondary | ICD-10-CM | POA: Diagnosis not present

## 2019-10-15 DIAGNOSIS — Z171 Estrogen receptor negative status [ER-]: Secondary | ICD-10-CM

## 2019-10-15 DIAGNOSIS — J45909 Unspecified asthma, uncomplicated: Secondary | ICD-10-CM

## 2019-10-15 LAB — URIC ACID: Uric Acid, Serum: 5.6 mg/dL (ref 2.4–7.0)

## 2019-10-15 LAB — TSH: TSH: 0.83 u[IU]/mL (ref 0.35–4.50)

## 2019-10-15 MED ORDER — COLCHICINE 0.6 MG PO TABS: ORAL_TABLET | ORAL | 0 refills | Status: DC

## 2019-10-15 MED ORDER — FLUTICASONE PROPIONATE 50 MCG/ACT NA SUSP: 2.0000 | NASAL | 5 refills | Status: DC | PRN

## 2019-10-15 NOTE — Progress Notes (Signed)
Subjective:    Patient ID: Katie Jacobson, female    DOB: 1956/01/26, 64 y.o.   MRN: [id number]  HPI Patient is a 64 year old female who presents today to reestablish care.  Hypertension-current BP medication includes Toprol-XL 50 mg once daily.  She is also taking enalapril 20 mg once daily.   BP Readings from Last 3 Encounters:  10/15/19 138/65  10/14/19 134/73  08/10/18 (!) 147/67   Hyperlipidemia- trying to eat healthier.  Lab Results  Component Value Date   CHOL 209 (H) 08/10/2018   HDL 76 08/10/2018   LDLCALC 116 (H) 08/10/2018   TRIG 73 08/10/2018   CHOLHDL 2.8 08/10/2018   History of breast cancer-she continues to follow with oncology for surveillance.  She last saw them yesterday.  It was felt that there was no evidence of active cancer or recurrence.  History of partial seizures- this occurred as a young child, no issues as an adult.   Asthma-maintained on as needed albuterol. Reports that this is well controlled. Not needing recently.   She denies SOB, notes that she does have some fatigue.  This has been the case since she was treated for breast cancer. She does not snore.    Review of Systems See HPI  Past Medical History:  Diagnosis Date  . Allergy   . Breast cancer of upper-outer quadrant of right female breast ([place]) 11/14/2014   finished chemo / over year  . Breast cancer, right breast ([place])   . History of chemotherapy    finished chemo 02/03/2015  . History of kidney stones   . History of seizures    as a child - unknown cause - states was never on anticonvulsants  . Hypertension    states under control with meds., has been on med. x "years"  . Mitral valve prolapse 1990  . Osteoporosis   . Personal history of chemotherapy   . Personal history of radiation therapy   . Seasonal allergies   . Seizures ([place])    as a child-none as a adult     Social History   Socioeconomic History  . Marital status: Married    Spouse name: Not on file  .  Number of children: 2  . Years of education: Not on file  . Highest education level: Not on file  Occupational History  . Not on file  Tobacco Use  . Smoking status: Current Every Day Smoker    Packs/day: 0.50    Years: 43.00    Pack years: 21.50    Types: Cigarettes  . Smokeless tobacco: Former Systems developer    Quit date: 09/09/2016  . Tobacco comment: 10 cig./day  Substance and Sexual Activity  . Alcohol use: Yes    Alcohol/week: 5.0 standard drinks    Types: 5 Standard drinks or equivalent per week  . Drug use: No  . Sexual activity: Not on file  Other Topics Concern  . 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  Has been doing part time work with [employer] -- had 6 active clients, but the most important one is her Daughter.   Regular exercise:  No   Caffeine Use: none; Current 1ppd smoker x > 39 yrs; EtOH ~4 oz/week   Social Determinants of Health   Financial Resource Strain:   .  Difficulty of Paying Living Expenses: Not on file  Food Insecurity:   . Worried About Charity fundraiser in the Last Year: Not on file  . Ran Out of Food in the Last Year: Not on file  Transportation Needs:   . Lack of Transportation (Medical): Not on file  . Lack of Transportation (Non-Medical): Not on file  Physical Activity:   . Days of Exercise per Week: Not on file  . Minutes of Exercise per Session: Not on file  Stress:   . Feeling of Stress : Not on file  Social Connections:   . Frequency of Communication with Friends and Family: Not on file  . Frequency of Social Gatherings with Friends and Family: Not on file  . Attends Religious Services: Not on file  . Active Member of Clubs or Organizations: Not on file  . Attends Archivist Meetings: Not on file  . Marital Status: Not on file  Intimate Partner Violence:   . Fear of  Current or Ex-Partner: Not on file  . Emotionally Abused: Not on file  . Physically Abused: Not on file  . Sexually Abused: Not on file    Past Surgical History:  Procedure Laterality Date  . ABDOMINAL HYSTERECTOMY  1998   partial  . APPENDECTOMY  1976  . BREAST LUMPECTOMY Right 02/2015  . COLONOSCOPY  09/30/2016  . KNEE ARTHROSCOPY Left 03/26/2008  . PLANTAR'S WART EXCISION Left    x 2  . PORT-A-CATH REMOVAL Left 04/06/2015   Procedure: REMOVAL PORT-A-CATH;  Surgeon: Rolm Bookbinder, MD;  Location: [facility];  Service: General;  Laterality: Left;  . PORTACATH PLACEMENT N/A 11/27/2014   Procedure: INSERTION PORT-A-CATH;  Surgeon: Rolm Bookbinder, MD;  Location: [facility];  Service: General;  Laterality: N/A;  . RADIOACTIVE SEED GUIDED PARTIAL MASTECTOMY WITH AXILLARY SENTINEL LYMPH NODE BIOPSY Right 03/02/2015   Procedure: RADIOACTIVE SEED GUIDED RIGHT PARTIAL MASTECTOMY WITH RIGHT AXILLARY SENTINEL LYMPH NODE BIOPSY;  Surgeon: Rolm Bookbinder, MD;  Location: [facility];  Service: General;  Laterality: Right;  . SALIVARY STONE REMOVAL Right 1972  . TONSILLECTOMY  1970s  . TUBAL LIGATION  1996    Family History  Problem Relation Age of Onset  . Heart disease Mother   . Stroke Mother   . Hypertension Mother   . Diabetes Mother   . Diabetes Sister   . Kidney disease Brother   . Mental retardation Brother   . Diabetes Sister   . Heart attack Father   . Breast cancer Cousin        deceased 48  . Colon polyps Daughter   . Colon cancer Neg Hx   . Esophageal cancer Neg Hx   . Rectal cancer Neg Hx   . Stomach cancer Neg Hx     Allergies  Allergen Reactions  . Sulfa Antibiotics Other (See Comments)    UNKNOWN    Current Outpatient Medications on File Prior to Visit  Medication Sig Dispense Refill  . albuterol (PROVENTIL HFA;VENTOLIN HFA) 108 (90 BASE) MCG/ACT inhaler Inhale into the lungs every 6 (six) hours as needed for  wheezing or shortness of breath.    Marland Kitchen aspirin 81 MG tablet Take 1 tablet (81 mg total) by mouth daily. 30 tablet 0  . chlorthalidone (HYGROTON) 25 MG tablet TAKE 1 TABLET(25 MG) BY MOUTH EVERY OTHER DAY 45 tablet 0  . cholecalciferol (VITAMIN D3) 25 MCG (1000 UT) tablet Take 1 tablet (1,000 Units total) by  mouth daily.    . enalapril (VASOTEC) 20 MG tablet Take 1 tablet (20 mg total) by mouth at bedtime. Please schedule appt for further refills, 1st attempt 90 tablet 0  . fluticasone (FLONASE) 50 MCG/ACT nasal spray Place 1 spray into both nostrils as needed.    . metoprolol succinate (TOPROL-XL) 50 MG 24 hr tablet NEED OV 30 tablet 2   No current facility-administered medications on file prior to visit.    BP 138/65 (BP Location: Left Arm, Patient Position: Sitting, Cuff Size: Large)   Pulse 79   Temp (!) 96.5 F (35.8 C) (Temporal)   Resp 18   Ht 5\' 1"  (1.549 m)   Wt 216 lb 6.4 oz (98.2 kg)   LMP 10/10/1996   SpO2 100%   BMI 40.89 kg/m       Objective:   Physical Exam Constitutional:      Appearance: She is well-developed.  Neck:     Thyroid: No thyromegaly.  Cardiovascular:     Rate and Rhythm: Normal rate and regular rhythm.     Heart sounds: Normal heart sounds. No murmur.  Pulmonary:     Effort: Pulmonary effort is normal. No respiratory distress.     Breath sounds: Normal breath sounds. No wheezing.  Musculoskeletal:     Cervical back: Neck supple.     Comments: Tenderness and erythema at right MTP  Skin:    General: Skin is warm and dry.  Neurological:     Mental Status: She is alert and oriented to person, place, and time.  Psychiatric:        Behavior: Behavior normal.        Thought Content: Thought content normal.        Judgment: Judgment normal.           Assessment & Plan:  Acute gout flare- suspect based on exam/presentation. Will rx with colchicine. Obtain baseline uric acid level.  HTN- BP stable on current regimen. Continue same.   Hx of  breast CA- in remission. Management per oncology.   Asthma- stable. Continue prn albuterol.   Hyperlipidemia- reviewed 07/30/19 lab work from [facility]. Lipids stable.   Weight loss- she reports some recent weight loss.  Will check TSH.

## 2019-10-15 NOTE — Telephone Encounter (Signed)
I talk with patient regarding schedule  

## 2019-10-15 NOTE — Telephone Encounter (Signed)
Copied from [place] [phone]. Topic: General - Other >> Oct 15, 2019  9:03 AM Leward Quan A wrote: Reason for CRM: Patient called back to speak to [facility] but was unable to reach him. Per patient she already had a physical elsewhere but due to the change in her insurance she decided to come back to her provider. Patient don't mind doing a virtual visit so please call her at Ph# [phone]

## 2019-10-16 ENCOUNTER — Telehealth: Payer: Self-pay

## 2019-10-16 LAB — SARS CORONAVIRUS 2 (TAT 6-24 HRS): SARS Coronavirus 2: NEGATIVE

## 2019-10-16 NOTE — Telephone Encounter (Signed)
Counseling Intern left VM for patient on 10/16/19 about scheduling a telehealth counseling appointment.   Art Buff [place] Counseling Intern Voicemail:  [phone]

## 2019-10-18 ENCOUNTER — Encounter: Payer: Self-pay | Admitting: Gastroenterology

## 2019-10-21 ENCOUNTER — Ambulatory Visit (INDEPENDENT_AMBULATORY_CARE_PROVIDER_SITE_OTHER): Payer: 59 | Admitting: Family

## 2019-10-21 ENCOUNTER — Encounter: Payer: Self-pay | Admitting: Family

## 2019-10-21 ENCOUNTER — Other Ambulatory Visit: Payer: Self-pay

## 2019-10-21 DIAGNOSIS — M542 Cervicalgia: Secondary | ICD-10-CM | POA: Diagnosis not present

## 2019-10-21 DIAGNOSIS — M109 Gout, unspecified: Secondary | ICD-10-CM | POA: Diagnosis not present

## 2019-10-21 MED ORDER — METHOCARBAMOL 500 MG PO TABS: 500.0000 mg | ORAL_TABLET | Freq: Every evening | ORAL | 0 refills | Status: DC | PRN

## 2019-10-21 MED ORDER — MELOXICAM 7.5 MG PO TABS: 7.5000 mg | ORAL_TABLET | Freq: Every day | ORAL | 0 refills | Status: DC

## 2019-10-21 NOTE — Progress Notes (Signed)
Virtual Visit via telephone Note  I connected with Katie Jacobson on 10/21/19 at 11:20 AM EST by telephone and verified that I am speaking with the correct person using two identifiers.  Location: Patient: home Provider: work   I discussed the limitations of evaluation and management by telemedicine and the availability of in person appointments. The patient expressed understanding and agreed to proceed.  History of Present Illness:  Patient is a 64 yr old female who presents today with chief complaint of neck pain.  Reports that she woke up with a "crook in my neck" on Thursday.  By Friday it was hurting on her had. Took a hot shower with a pulsate setting on her neck. Reports that symptoms are not as severe. She took aleve on Saturday night which did help some with her symptoms. She still has "a little" neck pain. Neck pain is made worse by moving.  She reports associated scalp tenderness/sensitivity.   Denies numbness, weakness.  Denies hx of facial drooping and slurred speech.    Acute gout exacerbation- was treated with colchicine last visit. Uric acid level was WNL. Reports that symptoms are much improved following colchicine.  Past Medical History:  Diagnosis Date  . Allergy   . Breast cancer of upper-outer quadrant of right female breast ([place]) 11/14/2014   finished chemo / over year  . Breast cancer, right breast ([place])   . History of chemotherapy    finished chemo 02/03/2015  . History of kidney stones   . History of seizures    as a child - unknown cause - states was never on anticonvulsants  . Hypertension    states under control with meds., has been on med. x "years"  . Mitral valve prolapse 1990  . Osteoporosis   . Personal history of chemotherapy   . Personal history of radiation therapy   . Seasonal allergies   . Seizures ([place])    as a child-none as a adult     Social History   Socioeconomic History  . Marital status: Married    Spouse name: Not on file  .  Number of children: 2  . Years of education: Not on file  . Highest education level: Not on file  Occupational History  . Not on file  Tobacco Use  . Smoking status: Current Every Day Smoker    Packs/day: 0.50    Years: 43.00    Pack years: 21.50    Types: Cigarettes  . Smokeless tobacco: Former Systems developer    Quit date: 09/09/2016  . Tobacco comment: 10 cig./day  Substance and Sexual Activity  . Alcohol use: Yes    Alcohol/week: 5.0 standard drinks    Types: 5 Standard drinks or equivalent per week  . Drug use: No  . Sexual activity: Not on file  Other Topics Concern  . 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  Has been doing part time work with [employer] -- had 6 active clients, but the most important one is her Daughter.   Regular exercise:  No   Caffeine Use: none; Current 1ppd smoker x > 39 yrs; EtOH ~4 oz/week   Social Determinants of Health   Financial Resource Strain:   . Difficulty of Paying Living Expenses: Not on file  Food Insecurity:   . Worried  About Running Out of Food in the Last Year: Not on file  . Ran Out of Food in the Last Year: Not on file  Transportation Needs:   . Lack of Transportation (Medical): Not on file  . Lack of Transportation (Non-Medical): Not on file  Physical Activity:   . Days of Exercise per Week: Not on file  . Minutes of Exercise per Session: Not on file  Stress:   . Feeling of Stress : Not on file  Social Connections:   . Frequency of Communication with Friends and Family: Not on file  . Frequency of Social Gatherings with Friends and Family: Not on file  . Attends Religious Services: Not on file  . Active Member of Clubs or Organizations: Not on file  . Attends Archivist Meetings: Not on file  . Marital Status: Not on file  Intimate Partner Violence:   . Fear of  Current or Ex-Partner: Not on file  . Emotionally Abused: Not on file  . Physically Abused: Not on file  . Sexually Abused: Not on file    Past Surgical History:  Procedure Laterality Date  . ABDOMINAL HYSTERECTOMY  1998   partial  . APPENDECTOMY  1976  . BREAST LUMPECTOMY Right 02/2015  . COLONOSCOPY  09/30/2016  . KNEE ARTHROSCOPY Left 03/26/2008  . PLANTAR'S WART EXCISION Left    x 2  . PORT-A-CATH REMOVAL Left 04/06/2015   Procedure: REMOVAL PORT-A-CATH;  Surgeon: Rolm Bookbinder, MD;  Location: [place];  Service: General;  Laterality: Left;  . PORTACATH PLACEMENT N/A 11/27/2014   Procedure: INSERTION PORT-A-CATH;  Surgeon: Rolm Bookbinder, MD;  Location: [facility];  Service: General;  Laterality: N/A;  . RADIOACTIVE SEED GUIDED PARTIAL MASTECTOMY WITH AXILLARY SENTINEL LYMPH NODE BIOPSY Right 03/02/2015   Procedure: RADIOACTIVE SEED GUIDED RIGHT PARTIAL MASTECTOMY WITH RIGHT AXILLARY SENTINEL LYMPH NODE BIOPSY;  Surgeon: Rolm Bookbinder, MD;  Location: [place];  Service: General;  Laterality: Right;  . SALIVARY STONE REMOVAL Right 1972  . TONSILLECTOMY  1970s  . TUBAL LIGATION  1996    Family History  Problem Relation Age of Onset  . Heart disease Mother   . Stroke Mother   . Hypertension Mother   . Diabetes Mother   . Diabetes Sister   . Kidney disease Brother   . Mental retardation Brother   . Diabetes Sister   . Heart attack Father   . Breast cancer Cousin        deceased 21  . Colon polyps Daughter   . Colon cancer Neg Hx   . Esophageal cancer Neg Hx   . Rectal cancer Neg Hx   . Stomach cancer Neg Hx     Allergies  Allergen Reactions  . Sulfa Antibiotics Other (See Comments)    UNKNOWN    Current Outpatient Medications on File Prior to Visit  Medication Sig Dispense Refill  . albuterol (PROVENTIL HFA;VENTOLIN HFA) 108 (90 BASE) MCG/ACT inhaler Inhale into the lungs every 6 (six) hours as needed for  wheezing or shortness of breath.    Marland Kitchen aspirin 81 MG tablet Take 1 tablet (81 mg total) by mouth daily. 30 tablet 0  . chlorthalidone (HYGROTON) 25 MG tablet TAKE 1 TABLET(25 MG) BY MOUTH EVERY OTHER DAY 45 tablet 0  . cholecalciferol (VITAMIN D3) 25 MCG (1000 UT) tablet Take 1 tablet (1,000 Units total) by mouth daily.    . colchicine 0.6 MG tablet Take 2 tablets by mouth  now and 1 tablet in 1 hour. May repeat tomorrow if symptoms do not improve 6 tablet 0  . enalapril (VASOTEC) 20 MG tablet Take 1 tablet (20 mg total) by mouth at bedtime. Please schedule appt for further refills, 1st attempt 90 tablet 0  . fluticasone (FLONASE) 50 MCG/ACT nasal spray Place 2 sprays into both nostrils as needed. 9.9 mL 5  . metoprolol succinate (TOPROL-XL) 50 MG 24 hr tablet NEED OV 30 tablet 2   No current facility-administered medications on file prior to visit.    LMP 10/10/1996      Observations/Objective:   Gen: Awake, alert, no acute distress Resp: Breathing is even and non-labored Psych: calm/pleasant demeanor Neuro: Alert and Oriented x 3, + facial symmetry, speech is clear.   Assessment and Plan:  Cervical neck pain- will rx with meloxicam and prn robaxin. Pt is advised to call if symptoms worsen or if symptoms fail to improve.   Acute gout exacerbation- resolved following treatment with colchicine. Monitor.   15 minutes spent on today's visit.  Follow Up Instructions:    I discussed the assessment and treatment plan with the patient. The patient was provided an opportunity to ask questions and all were answered. The patient agreed with the plan and demonstrated an understanding of the instructions.   The patient was advised to call back or seek an in-person evaluation if the symptoms worsen or if the condition fails to improve as anticipated.  Nance Pear, NP

## 2019-10-29 ENCOUNTER — Telehealth: Payer: Self-pay | Admitting: *Deleted

## 2019-10-29 NOTE — Telephone Encounter (Signed)
Pt no showed 2 pm PV- Called pt at 3 pm- LM to return call to RS PV by 5 pm or both appointments would be canceled   5 Pm- No RS- Canceled Pv and colon 2-1 Mailed  NS letter to pt

## 2019-11-01 ENCOUNTER — Other Ambulatory Visit: Payer: Self-pay

## 2019-11-01 ENCOUNTER — Ambulatory Visit (AMBULATORY_SURGERY_CENTER): Payer: 59 | Admitting: *Deleted

## 2019-11-01 VITALS — Ht 61.0 in | Wt 216.0 lb

## 2019-11-01 DIAGNOSIS — Z01818 Encounter for other preprocedural examination: Secondary | ICD-10-CM

## 2019-11-01 DIAGNOSIS — Z860100 Personal history of colon polyps, unspecified: Secondary | ICD-10-CM

## 2019-11-01 NOTE — Progress Notes (Signed)
Pt verified name, DOB, address and insurance during PV today. Pt mailed instruction packet to included paper to complete and mail back to [facility] with addressed and stamped envelope, Emmi video, copy of consent form to read and not return, and instructions. PV completed over the phone. Pt encouraged to call with questions or issues   No egg or soy allergy known to patient  No issues with past sedation with any surgeries  or procedures, no intubation problems  No diet pills per patient No home 02 use per patient  No blood thinners per patient  Pt denies issues with constipation  No A fib or A flutter  EMMI video sent to pt's e mail  Pt has a Suprep at home from 12-22 PV   Due to the COVID-19 pandemic we are asking patients to follow these guidelines. Please only bring one care partner. Please be aware that your care partner may wait in the car in the parking lot or if they feel like they will be too hot to wait in the car, they may wait in the lobby on the 4th floor. All care partners are required to wear a mask the entire time (we do not have any that we can provide them), they need to practice social distancing, and we will do a Covid check for all patient's and care partners when you arrive. Also we will check their temperature and your temperature. If the care partner waits in their car they need to stay in the parking lot the entire time and we will call them on their cell phone when the patient is ready for discharge so they can bring the car to the front of the building. Also all patient's will need to wear a mask into building.

## 2019-11-07 ENCOUNTER — Ambulatory Visit (INDEPENDENT_AMBULATORY_CARE_PROVIDER_SITE_OTHER): Payer: Self-pay

## 2019-11-07 DIAGNOSIS — Z1159 Encounter for screening for other viral diseases: Secondary | ICD-10-CM

## 2019-11-08 LAB — SARS CORONAVIRUS 2 (TAT 6-24 HRS): SARS Coronavirus 2: NEGATIVE

## 2019-11-11 ENCOUNTER — Other Ambulatory Visit: Payer: Self-pay

## 2019-11-11 ENCOUNTER — Encounter: Payer: Self-pay | Admitting: Gastroenterology

## 2019-11-11 ENCOUNTER — Ambulatory Visit (AMBULATORY_SURGERY_CENTER): Payer: 59 | Admitting: Gastroenterology

## 2019-11-11 ENCOUNTER — Other Ambulatory Visit: Payer: Self-pay | Admitting: Gastroenterology

## 2019-11-11 VITALS — BP 124/67 | HR 67 | Temp 96.9°F | Resp 25 | Ht 61.0 in | Wt 216.0 lb

## 2019-11-11 DIAGNOSIS — D123 Benign neoplasm of transverse colon: Secondary | ICD-10-CM

## 2019-11-11 DIAGNOSIS — Z86010 Personal history of colonic polyps: Secondary | ICD-10-CM

## 2019-11-11 MED ORDER — SODIUM CHLORIDE 0.9 % IV SOLN: 500.0000 mL | Freq: Once | INTRAVENOUS | Status: DC

## 2019-11-11 NOTE — Progress Notes (Signed)
Called to room to assist during endoscopic procedure.  Patient ID and intended procedure confirmed with present staff. Received instructions for my participation in the procedure from the performing physician.  

## 2019-11-11 NOTE — Patient Instructions (Signed)
YOU HAD AN ENDOSCOPIC PROCEDURE TODAY AT [facility]:   Refer to the procedure report that was given to you for any specific questions about what was found during the examination.  If the procedure report does not answer your questions, please call your gastroenterologist to clarify.  If you requested that your care partner not be given the details of your procedure findings, then the procedure report has been included in a sealed envelope for you to review at your convenience later.  YOU SHOULD EXPECT: Some feelings of bloating in the abdomen. Passage of more gas than usual.  Walking can help get rid of the air that was put into your GI tract during the procedure and reduce the bloating. If you had a lower endoscopy (such as a colonoscopy or flexible sigmoidoscopy) you may notice spotting of blood in your stool or on the toilet paper. If you underwent a bowel prep for your procedure, you may not have a normal bowel movement for a few days.  Please Note:  You might notice some irritation and congestion in your nose or some drainage.  This is from the oxygen used during your procedure.  There is no need for concern and it should clear up in a day or so.  SYMPTOMS TO REPORT IMMEDIATELY:   Following lower endoscopy (colonoscopy or flexible sigmoidoscopy):  Excessive amounts of blood in the stool  Significant tenderness or worsening of abdominal pains  Swelling of the abdomen that is new, acute  Fever of 100F or higher    For urgent or emergent issues, a gastroenterologist can be reached at any hour by calling [phone].   DIET:  We do recommend a small meal at first, but then you may proceed to your regular diet.  Drink plenty of fluids but you should avoid alcoholic beverages for 24 hours.  ACTIVITY:  You should plan to take it easy for the rest of today and you should NOT DRIVE or use heavy machinery until tomorrow (because of the sedation medicines used during the test).     FOLLOW UP: Our staff will call the number listed on your records 48-72 hours following your procedure to check on you and address any questions or concerns that you may have regarding the information given to you following your procedure. If we do not reach you, we will leave a message.  We will attempt to reach you two times.  During this call, we will ask if you have developed any symptoms of COVID 19. If you develop any symptoms (ie: fever, flu-like symptoms, shortness of breath, cough etc.) before then, please call [phone].  If you test positive for Covid 19 in the 2 weeks post procedure, please call and report this information to [place].    If any biopsies were taken you will be contacted by phone or by letter within the next 1-3 weeks.  Please call us at [phone] if you have not heard about the biopsies in 3 weeks.    SIGNATURES/CONFIDENTIALITY: You and/or your care partner have signed paperwork which will be entered into your electronic medical record.  These signatures attest to the fact that that the information above on your After Visit Summary has been reviewed and is understood.  Full responsibility of the confidentiality of this discharge information lies with you and/or your care-partner.  Resume medications. Information given on polyps and diverticulosis.

## 2019-11-11 NOTE — Progress Notes (Signed)
A and O x3. Report to RN. Tolerated MAC anesthesia well.

## 2019-11-11 NOTE — Op Note (Signed)
[facility] Patient Name: Katie Jacobson Procedure Date: 11/11/2019 11:52 AM MRN: [id number] Endoscopist: Viviann Spare P. Adela Lank , MD Age: 64 Referring MD:  Date of Birth: 1956-03-16 Gender: Female Account #: [id number] Procedure:                Colonoscopy Indications:              Surveillance: Personal history of (4 adenomatous /                            sessile serrated) polyps on last colonoscopy 3                            years ago Medicines:                Monitored Anesthesia Care Procedure:                Pre-Anesthesia Assessment:                           - Prior to the procedure, a History and Physical                            was performed, and patient medications and                            allergies were reviewed. The patient's tolerance of                            previous anesthesia was also reviewed. The risks                            and benefits of the procedure and the sedation                            options and risks were discussed with the patient.                            All questions were answered, and informed consent                            was obtained. Prior Anticoagulants: The patient has                            taken no previous anticoagulant or antiplatelet                            agents. ASA Grade Assessment: II - A patient with                            mild systemic disease. After reviewing the risks                            and benefits, the patient was deemed in  satisfactory condition to undergo the procedure.                           After obtaining informed consent, the colonoscope                            was passed under direct vision. Throughout the                            procedure, the patient's blood pressure, pulse, and                            oxygen saturations were monitored continuously. The                            Colonoscope was introduced through the anus  and                            advanced to the the cecum, identified by                            appendiceal orifice and ileocecal valve. The                            colonoscopy was performed without difficulty. The                            patient tolerated the procedure well. The quality                            of the bowel preparation was good. The ileocecal                            valve, appendiceal orifice, and rectum were                            photographed. Scope In: 12:03:54 PM Scope Out: 12:15:38 PM Scope Withdrawal Time: 0 hours 10 minutes 19 seconds  Total Procedure Duration: 0 hours 11 minutes 44 seconds  Findings:                 The perianal and digital rectal examinations were                            normal.                           A diminutive polyp was found in the transverse                            colon. The polyp was sessile. The polyp was removed                            with a cold snare. Resection and retrieval were  complete.                           Multiple small-mouthed diverticula were found in                            the entire colon.                           The exam was otherwise without abnormality. Complications:            No immediate complications. Estimated blood loss:                            Minimal. Estimated Blood Loss:     Estimated blood loss was minimal. Impression:               - One diminutive polyp in the transverse colon,                            removed with a cold snare. Resected and retrieved.                           - Diverticulosis in the entire examined colon.                           - The examination was otherwise normal. Recommendation:           - Patient has a contact number available for                            emergencies. The signs and symptoms of potential                            delayed complications were discussed with the                             patient. Return to normal activities tomorrow.                            Written discharge instructions were provided to the                            patient.                           - Resume previous diet.                           - Continue present medications.                           - Await pathology results. Viviann Spare P. Jniyah Dantuono, MD 11/11/2019 12:19:16 PM This report has been signed electronically.

## 2019-11-11 NOTE — Progress Notes (Signed)
Pt's states no medical or surgical changes since previsit or office visit.  Temp-jb  V/s-dt

## 2019-11-13 ENCOUNTER — Telehealth: Payer: Self-pay

## 2019-11-13 NOTE — Telephone Encounter (Signed)
  Follow up Call-  Call back number 11/11/2019  Post procedure Call Back phone  # [phone]  Permission to leave phone message Yes  Some recent data might be hidden     Patient questions:  Do you have a fever, pain , or abdominal swelling? No. Pain Score  0 *  Have you tolerated food without any problems? Yes.    Have you been able to return to your normal activities? Yes.    Do you have any questions about your discharge instructions: Diet   No. Medications  No. Follow up visit  No.  Do you have questions or concerns about your Care? No.  Actions: * If pain score is 4 or above: No action needed, pain <4. 1. Have you developed a fever since your procedure? NO  2.   Have you had an respiratory symptoms (SOB or cough) since your procedure? NO  3.   Have you tested positive for COVID 19 since your procedure NO  4.   Have you had any family members/close contacts diagnosed with the COVID 19 since your procedure?  NO   If yes to any of these questions please route to Joylene John, RN and Alphonsa Gin, RN.

## 2019-11-14 ENCOUNTER — Encounter: Payer: Self-pay | Admitting: Gastroenterology

## 2020-01-02 ENCOUNTER — Encounter: Payer: Self-pay | Admitting: Cardiology

## 2020-01-02 ENCOUNTER — Ambulatory Visit (INDEPENDENT_AMBULATORY_CARE_PROVIDER_SITE_OTHER): Payer: 59 | Admitting: Cardiology

## 2020-01-02 ENCOUNTER — Other Ambulatory Visit: Payer: Self-pay

## 2020-01-02 VITALS — BP 138/68 | HR 70 | Ht 61.0 in | Wt 221.2 lb

## 2020-01-02 DIAGNOSIS — E669 Obesity, unspecified: Secondary | ICD-10-CM | POA: Diagnosis not present

## 2020-01-02 DIAGNOSIS — I10 Essential (primary) hypertension: Secondary | ICD-10-CM | POA: Diagnosis not present

## 2020-01-02 DIAGNOSIS — R002 Palpitations: Secondary | ICD-10-CM | POA: Diagnosis not present

## 2020-01-02 DIAGNOSIS — Z720 Tobacco use: Secondary | ICD-10-CM

## 2020-01-02 DIAGNOSIS — I340 Nonrheumatic mitral (valve) insufficiency: Secondary | ICD-10-CM | POA: Diagnosis not present

## 2020-01-02 DIAGNOSIS — J4521 Mild intermittent asthma with (acute) exacerbation: Secondary | ICD-10-CM

## 2020-01-02 NOTE — Patient Instructions (Signed)
Medication Instructions:   no changes  try and use your inhaler at bedtime  Try taking  Generic Zyrtec or brand at night to help you sleep    If you have any lab test that is abnormal or we need to change your treatment, we will call you to review the results. *If you need a refill on your cardiac medications before your next appointment, please call your pharmacy*   Lab Work: Not needed   Testing/Procedures: Not needed   Follow-Up: At [facility], you and your health needs are our priority.  As part of our continuing mission to provide you with exceptional heart care, we have created designated Provider Care Teams.  These Care Teams include your primary Cardiologist (physician) and Advanced Practice Providers (APPs -  Physician Assistants and Nurse Practitioners) who all work together to provide you with the care you need, when you need it.  We recommend signing up for the patient portal called "MyChart".  Sign up information is provided on this After Visit Summary.  MyChart is used to connect with patients for Virtual Visits (Telemedicine).  Patients are able to view lab/test results, encounter notes, upcoming appointments, etc.  Non-urgent messages can be sent to your provider as well.   To learn more about what you can do with MyChart, go to [email or web address].    Your next appointment:   6 month(s)  The format for your next appointment:   In Person  Provider:   Glenetta Hew, MD   Other Instructions Try taking  Generic Zyrtec or brand at night to help you sleep

## 2020-01-02 NOTE — Progress Notes (Signed)
Primary Care Provider: Debbrah Alar, NP Cardiologist: Glenetta Hew, MD Electrophysiologist: None  Clinic Note: Chief Complaint  Patient presents with  . Follow-up    Heart murmur mitral regurgitation  . Shortness of Breath    Dealing with lots of congestion and allergy issues.  . Palpitations    Doing fairly well    HPI:    Katie Jacobson is a 64 y.o. female with a PMH notable for hypertension, hyperlipidemia and palpitations (moderate mild regurgitation) who presents today for basely 2-year follow-up.  Katie Jacobson was last seen in February 2018 (was undergoing treatment for breast cancer-had a lumpectomy in May 2016, was on chemotherapy and radiation completed in September 2016).  Apparently her daughter passed away and she gained quite a bit of weight.  She was doing "comfort eating".  She noted that her husband was insisting on 3 meals a day, she was eating as much as him.  Not ready to quit smoking. --> 2D echo ordered (done in Feb 2019).  Recent Hospitalizations: N/A  Reviewed  CV studies:    The following studies were reviewed today: (if available, images/films reviewed: From Epic Chart or Care Everywhere) . February 2019-Echo: Normal LV size and function.  EF 60 to 65%.  GRII DD.  High filling pressures.  Mild-moderate aortic regurgitation.  Thickened mitral valve leaflets fibrotic apparatus.  Concentric MR-moderate moderate LA dilation.  Mild to moderate TR.   Interval History:   Katie LEGARRETA presents today for delayed follow-up indicating that she had insurance issues over the last couple years, now has better coverage.  Apparently [facility] was not listed as a covered provider according to her previous plan.  With new insurance coverage, this is changed.  She notes that over the last couple years she had lost down weight but then has gained back weight.  She indicates that she has not been as active as she would like to been.  The biggest issue  over the last couple weeks has been that she is been hit hard by allergies and has had significant congestion issues.  She has minimal sleep last 2 weeks citing congestion and postnasal drip.  She does not have any improvement with sleeping on a couch.  Just notes that she has constant phlegm buildup.  She has tried multiple medications but has not had any success.  She tolerate Benadryl, but in the past she has had adverse reaction to Benadryl where she has gotten "wound up".  She is also concerned about using Sudafed because of her history of palpitations.  Palpitations have been pretty well controlled since starting his current dose of metoprolol, but is about to run out.  Before all these allergies kicked in, she really was not noticed not much in the way of any PND orthopnea.  Trivial edema.  But pretty well controlled with chlorthalidone.  CV Review of Symptoms (Summary) Cardiovascular ROS: positive for - Ongoing shortness of breath and exertional dyspnea with allergies, but not really before that.  Mostly because of deconditioning. negative for - chest pain, edema, irregular heartbeat, orthopnea, palpitations, paroxysmal nocturnal dyspnea, rapid heart rate or Syncope/near syncope TIA/amaurosis fugax, claudication  The patient does not have symptoms concerning for COVID-19 infection (fever, chills, cough, or new shortness of breath).  The patient is practicing social distancing & Masking.   She had her first Covid vaccine shot on March 23.  Scheduled for second on April 13.  REVIEWED OF SYSTEMS   Review of Systems  Constitutional: Positive for malaise/fatigue (not getting enough sleep recently). Negative for weight loss (gain during pandemic).  HENT: Negative for congestion.   Respiratory: Positive for cough and shortness of breath (Worse with current congestion).        Related to allergies and congestion.  Cardiovascular: Negative for claudication.  Gastrointestinal: Negative for blood  in stool and melena.  Genitourinary: Negative for hematuria.  Musculoskeletal: Positive for joint pain. Negative for falls.  Neurological: Positive for dizziness and headaches. Negative for focal weakness and weakness.       With congestion and coughing  Endo/Heme/Allergies: Positive for environmental allergies.  Psychiatric/Behavioral: Negative for memory loss. The patient has insomnia (Not sleeping well now because of congestion and difficulty breathing.;  Has not gotten a good night sleep in several days.). The patient is not nervous/anxious.    I have reviewed and (if needed) personally updated the patient's problem list, medications, allergies, past medical and surgical history, social and family history.   PAST MEDICAL HISTORY   Past Medical History:  Diagnosis Date  . Allergy   . Breast cancer of upper-outer quadrant of right female breast ([place]) 11/14/2014   Treated with chemotherapy and radiation  . History of chemotherapy    finished chemo 02/03/2015  . History of kidney stones   . History of seizures    as a child - unknown cause - states was never on anticonvulsants  . Hypertension    states under control with meds., has been on med. x "years"  . Mitral valve prolapse 1990   Follow-up echocardiogram not show mitral prolapse.  Shows mild mitral vegetation.  . Osteoporosis   . Personal history of chemotherapy   . Personal history of radiation therapy   . Seasonal allergies   . Seizures ([place])    as a child-none as a adult    PAST SURGICAL HISTORY   Past Surgical History:  Procedure Laterality Date  . ABDOMINAL HYSTERECTOMY  1998   partial  . APPENDECTOMY  1976  . BREAST LUMPECTOMY Right 02/2015  . COLONOSCOPY  09/30/2016  . KNEE ARTHROSCOPY Left 03/26/2008  . PLANTAR'S WART EXCISION Left    x 2  . PORT-A-CATH REMOVAL Left 04/06/2015   Procedure: REMOVAL PORT-A-CATH;  Surgeon: Rolm Bookbinder, MD;  Location: [facility];  Service: General;   Laterality: Left;  . PORTACATH PLACEMENT N/A 11/27/2014   Procedure: INSERTION PORT-A-CATH;  Surgeon: Rolm Bookbinder, MD;  Location: [facility];  Service: General;  Laterality: N/A;  . RADIOACTIVE SEED GUIDED PARTIAL MASTECTOMY WITH AXILLARY SENTINEL LYMPH NODE BIOPSY Right 03/02/2015   Procedure: RADIOACTIVE SEED GUIDED RIGHT PARTIAL MASTECTOMY WITH RIGHT AXILLARY SENTINEL LYMPH NODE BIOPSY;  Surgeon: Rolm Bookbinder, MD;  Location: [facility];  Service: General;  Laterality: Right;  . SALIVARY STONE REMOVAL Right 1972  . TONSILLECTOMY  1970s  . TRANSTHORACIC ECHOCARDIOGRAM  11/2017   Normal LV size and function.  EF 60 to 65%.  GRII DD.  High filling pressures.  Mild-moderate aortic regurgitation.  Thickened mitral valve leaflets fibrotic apparatus.  Concentric MR-moderate moderate LA dilation.  Mild to moderate TR.  . TUBAL LIGATION  1996    MEDICATIONS/ALLERGIES   Current Meds  Medication Sig  . albuterol (PROVENTIL HFA;VENTOLIN HFA) 108 (90 BASE) MCG/ACT inhaler Inhale into the lungs every 6 (six) hours as needed for wheezing or shortness of breath.  Marland Kitchen aspirin 81 MG tablet Take 1 tablet (81 mg total) by mouth daily.  . chlorthalidone (HYGROTON) 25  MG tablet TAKE 1 TABLET(25 MG) BY MOUTH EVERY OTHER DAY  . cholecalciferol (VITAMIN D3) 25 MCG (1000 UT) tablet Take 1 tablet (1,000 Units total) by mouth daily.  . enalapril (VASOTEC) 20 MG tablet Take 1 tablet (20 mg total) by mouth at bedtime. Please schedule appt for further refills, 1st attempt  . fluticasone (FLONASE) 50 MCG/ACT nasal spray Place 2 sprays into both nostrils as needed.  . metoprolol succinate (TOPROL-XL) 50 MG 24 hr tablet NEED OV    Allergies  Allergen Reactions  . Sulfa Antibiotics Other (See Comments)    UNKNOWN    SOCIAL HISTORY/FAMILY HISTORY   Reviewed in Epic:  Pertinent findings: No new findings.  OBJCTIVE -PE, EKG, labs   Wt Readings from Last 3 Encounters:  01/02/20  221 lb 3.2 oz (100.3 kg)  11/11/19 216 lb (98 kg)  11/01/19 216 lb (98 kg)    Physical Exam: BP 138/68   Pulse 70   Ht 5\' 1"  (1.549 m)   Wt 221 lb 3.2 oz (100.3 kg)   LMP 10/10/1996   SpO2 99%   BMI 41.80 kg/m  Physical Exam  Constitutional: She is oriented to person, place, and time. She appears well-developed and well-nourished. No distress.  Morbidly obese.  HENT:  Head: Normocephalic and atraumatic.  Neck: No hepatojugular reflux and no JVD present. Carotid bruit is not present.  Cardiovascular: Normal rate, regular rhythm, S1 normal, S2 normal and intact distal pulses.  No extrasystoles are present. PMI is not displaced (Unable to palpate because of body habitus.). Exam reveals distant heart sounds. Exam reveals no gallop (Soft 1/6 SEM at RUSB.  Also soft HSM at apex.  Difficult to assess due to body habitus.).  Pulmonary/Chest: Effort normal and breath sounds normal. No respiratory distress. She has no wheezes. She has no rales.  Musculoskeletal:        General: No edema. Normal range of motion.     Cervical back: Normal range of motion and neck supple.  Neurological: She is alert and oriented to person, place, and time.  Psychiatric: She has a normal mood and affect. Her behavior is normal. Judgment and thought content normal.  Vitals reviewed.   Adult ECG Report  Rate: 70;  Rhythm: normal sinus rhythm and Normal axis, intervals and durations.  Stable EKG.;   Narrative Interpretation: Stable EKG  Recent Labs:    Lab Results  Component Value Date   CHOL 209 (H) 08/10/2018   HDL 76 08/10/2018   LDLCALC 116 (H) 08/10/2018   TRIG 73 08/10/2018   CHOLHDL 2.8 08/10/2018   Lab Results  Component Value Date   CREATININE 0.93 08/10/2018   BUN 13 08/10/2018   NA 140 08/10/2018   K 4.1 08/10/2018   CL 105 08/10/2018   CO2 24 08/10/2018   Lab Results  Component Value Date   TSH 0.83 10/15/2019    ASSESSMENT/PLAN    Problem List Items Addressed This Visit     Intermittent palpitations (Chronic)    Seems to be controlled with beta-blocker.  I do not know the necessarily need to push that dose any further as it would likely not help blood pressure all that much and her palpitations are controlled.      Relevant Orders   EKG 12-Lead   ECHOCARDIOGRAM COMPLETE   Essential hypertension (Chronic)    Blood pressure has been well controlled with chlorthalidone and enalapril plus metoprolol, but now is little elevated.  She has not taken her medications today.  We will reassess in 6 months.  We will likely consider switching from ACE inhibitor to ARB if her respiratory symptoms not improved.  Next treatment option would be to add calcium blocker.      Tobacco abuse (Chronic)    I explained to her that probably the reason why she is having such about time with these allergies is that she is smoking as well.  We talked about how much she needs stop.  She acknowledges this but does not seem to be all that interested in quitting at this point.      Obesity (BMI 35.0-39.9 without comorbidity) (Chronic)    The patient understands the need to lose weight with diet and exercise. We have discussed specific strategies for this.      Mitral regurgitation due to cusp prolapse - Primary (Chronic)    She does have a history of mitral prolapse and regurgitation.  Was not noted to be significant by recent echo in 2019.  Should be to follow-up to ensure no progression of disease.  Was thinking about doing it prior to 1 year visit, but with her now having some orthopnea and dyspnea symptoms which probably are related to allergies, I want to make sure that there is no CHF component.      Relevant Orders   EKG 12-Lead   ECHOCARDIOGRAM COMPLETE   Asthma    She carries a diagnosis of "asthma "which is probably combination of smoking-related reactive airways disease now exacerbated by her allergies.  I told her to try taking her albuterol inhaler at night.  Also  recommended that she uses an antihistamine such as Zyrtec which may give her some soporific effect but not the adverse reaction noted with Benadryl.          COVID-19 Education: The signs and symptoms of COVID-19 were discussed with the patient and how to seek care for testing (follow up with PCP or arrange E-visit).   The importance of social distancing was discussed today.  I spent a total of 63minutes with the patient. >  50% of the time was spent in direct patient consultation.  Additional time spent with chart review  / charting (studies, outside notes, etc): 8 Total Time: 30 min  Current medicines are reviewed at length with the patient today.  (+/- concerns) n/a  Notice: This dictation was prepared with Dragon dictation along with smaller phrase technology. Any transcriptional errors that result from this process are unintentional and may not be corrected upon review.  Patient Instructions / Medication Changes & Studies & Tests Ordered   Patient Instructions  Medication Instructions:   no changes  try and use your inhaler at bedtime  Try taking  Generic Zyrtec or brand at night to help you sleep    If you have any lab test that is abnormal or we need to change your treatment, we will call you to review the results. *If you need a refill on your cardiac medications before your next appointment, please call your pharmacy*   Lab Work: Not needed   Testing/Procedures: Not needed   Follow-Up: At [facility], you and your health needs are our priority.  As part of our continuing mission to provide you with exceptional heart care, we have created designated Provider Care Teams.  These Care Teams include your primary Cardiologist (physician) and Advanced Practice Providers (APPs -  Physician Assistants and Nurse Practitioners) who all work together to provide you with the care you need, when you need  it.  We recommend signing up for the patient portal called "MyChart".   Sign up information is provided on this After Visit Summary.  MyChart is used to connect with patients for Virtual Visits (Telemedicine).  Patients are able to view lab/test results, encounter notes, upcoming appointments, etc.  Non-urgent messages can be sent to your provider as well.   To learn more about what you can do with MyChart, go to [email or web address].    Your next appointment:   6 month(s)  The format for your next appointment:   In Person  Provider:   Glenetta Hew, MD   Other Instructions Try taking  Generic Zyrtec or brand at night to help you sleep    Studies Ordered:   Orders Placed This Encounter  Procedures  . EKG 12-Lead  . ECHOCARDIOGRAM COMPLETE     Glenetta Hew, M.D., M.S. Interventional Cardiologist   Pager # [phone] Phone # [phone] [address] [place], [place] [address]   Thank you for choosing Heartcare at [facility]!!

## 2020-01-05 ENCOUNTER — Encounter: Payer: Self-pay | Admitting: Cardiology

## 2020-01-05 NOTE — Assessment & Plan Note (Signed)
She does have a history of mitral prolapse and regurgitation.  Was not noted to be significant by recent echo in 2019.  Should be to follow-up to ensure no progression of disease.  Was thinking about doing it prior to 1 year visit, but with her now having some orthopnea and dyspnea symptoms which probably are related to allergies, I want to make sure that there is no CHF component.

## 2020-01-05 NOTE — Assessment & Plan Note (Signed)
Seems to be controlled with beta-blocker.  I do not know the necessarily need to push that dose any further as it would likely not help blood pressure all that much and her palpitations are controlled.

## 2020-01-05 NOTE — Assessment & Plan Note (Signed)
Blood pressure has been well controlled with chlorthalidone and enalapril plus metoprolol, but now is little elevated.  She has not taken her medications today.  We will reassess in 6 months.  We will likely consider switching from ACE inhibitor to ARB if her respiratory symptoms not improved.  Next treatment option would be to add calcium blocker.

## 2020-01-05 NOTE — Assessment & Plan Note (Signed)
The patient understands the need to lose weight with diet and exercise. We have discussed specific strategies for this.  

## 2020-01-05 NOTE — Assessment & Plan Note (Signed)
She carries a diagnosis of "asthma "which is probably combination of smoking-related reactive airways disease now exacerbated by her allergies.  I told her to try taking her albuterol inhaler at night.  Also recommended that she uses an antihistamine such as Zyrtec which may give her some soporific effect but not the adverse reaction noted with Benadryl.

## 2020-01-05 NOTE — Assessment & Plan Note (Signed)
I explained to her that probably the reason why she is having such about time with these allergies is that she is smoking as well.  We talked about how much she needs stop.  She acknowledges this but does not seem to be all that interested in quitting at this point.

## 2020-01-07 ENCOUNTER — Telehealth: Payer: Self-pay | Admitting: Family

## 2020-01-07 DIAGNOSIS — Z853 Personal history of malignant neoplasm of breast: Secondary | ICD-10-CM

## 2020-01-07 NOTE — Telephone Encounter (Signed)
Caller : Syndi Vankampen  Call Back # [phone]   Requesting a Mammogram, patient was seen by [facility] for mammogram in Oct 2021, and they stated that she need mammogram very 6 month.   Please advise

## 2020-01-08 MED ORDER — MONTELUKAST SODIUM 10 MG PO TABS: 10.0000 mg | ORAL_TABLET | Freq: Every day | ORAL | 3 refills | Status: DC

## 2020-01-08 MED ORDER — BENZONATATE 100 MG PO CAPS: 100.0000 mg | ORAL_CAPSULE | Freq: Every evening | ORAL | 0 refills | Status: DC | PRN

## 2020-01-08 NOTE — Telephone Encounter (Signed)
Reports that she coughs when she lays down, zyrtec not working.  Having trouble sleeping. Feels like asthma symptoms worse recently. Saw cardiology last week- they ordered a follow up Echo. Will give trial of singulair. She is advised to go to ER if severe SOB, schedule office visit if symptoms do not improve. Pt verbalizes understanding.

## 2020-01-16 ENCOUNTER — Other Ambulatory Visit: Payer: Self-pay

## 2020-01-16 ENCOUNTER — Ambulatory Visit (HOSPITAL_COMMUNITY): Payer: 59 | Attending: Cardiology

## 2020-01-16 DIAGNOSIS — R002 Palpitations: Secondary | ICD-10-CM | POA: Diagnosis not present

## 2020-01-16 DIAGNOSIS — I340 Nonrheumatic mitral (valve) insufficiency: Secondary | ICD-10-CM | POA: Insufficient documentation

## 2020-01-16 HISTORY — PX: TRANSTHORACIC ECHOCARDIOGRAM: SHX275

## 2020-02-04 ENCOUNTER — Other Ambulatory Visit: Payer: Self-pay | Admitting: Cardiology

## 2020-02-04 MED ORDER — METOPROLOL SUCCINATE ER 50 MG PO TB24: 50.0000 mg | ORAL_TABLET | Freq: Every day | ORAL | 3 refills | Status: DC

## 2020-02-04 MED ORDER — CHLORTHALIDONE 25 MG PO TABS: ORAL_TABLET | ORAL | 3 refills | Status: DC

## 2020-02-04 MED ORDER — ENALAPRIL MALEATE 20 MG PO TABS: 20.0000 mg | ORAL_TABLET | Freq: Every day | ORAL | 3 refills | Status: DC

## 2020-02-04 NOTE — Telephone Encounter (Signed)
Rx(s) sent to pharmacy electronically.  

## 2020-02-04 NOTE — Telephone Encounter (Signed)
*  STAT* If patient is at the pharmacy, call can be transferred to refill team.   1. Which medications need to be refilled? (please list name of each medication and dose if known)  chlorthalidone (HYGROTON) 25 MG tablet enalapril (VASOTEC) 20 MG tablet metoprolol succinate (TOPROL-XL) 50 MG 24 hr tablet  2. Which pharmacy/location (including street and city if local pharmacy) is medication to be sent to?  [id number] - [place], [place] - [address]  3. Do they need a 30 day or 90 day supply? 94   Pt ws told by her pharmacy  to schedule an appt to see Dr. Ellyn Hack before she could get refills however she just saw Dr. Ellyn Hack 01-04-20

## 2020-02-28 ENCOUNTER — Telehealth: Payer: Self-pay | Admitting: Family

## 2020-02-28 NOTE — Telephone Encounter (Signed)
CallerBrandylee Jacobson  Call Back # [phone]  Patient calling to request a handicap stick paper filled out. Patient also states that when disability paperwork comes in she will send information provider. Patient states she will provider to help fill it out.

## 2020-02-28 NOTE — Telephone Encounter (Signed)
Patient advised for is ready, will pick up at front desk

## 2020-02-28 NOTE — Telephone Encounter (Signed)
Form completed.

## 2020-03-23 ENCOUNTER — Telehealth: Payer: Self-pay

## 2020-03-23 NOTE — Telephone Encounter (Signed)
   [facility] Pre-operative Risk Assessment    HEARTCARE STAFF: - Please ensure there is not already an duplicate clearance open for this procedure. - Under Visit Info/Reason for Call, type in Other and utilize the format Clearance MM/DD/YY or Clearance TBD. Do not use dashes or single digits. - If request is for dental extraction, please clarify the # of teeth to be extracted.  Request for surgical clearance:  1. What type of surgery is being performed? 2 MOLARS TO BE  EXTRACTED  2. When is this surgery scheduled? TBD   3. What type of clearance is required (medical clearance vs. Pharmacy clearance to hold med vs. Both)? MEDICATION  4. Are there any medications that need to be held prior to surgery and how long? ASA 81MG(1 DAY BEFORE)   ALSO, PT WILL NEED TO TAKE AMOX  500MG x4 TABS BEFORE  5. Practice name and name of physician performing surgery? [facility]    6. What is the office phone number? [phone]   7.   What is the office fax number? [phone]  8.   Anesthesia type (None, local, MAC, general) ? PT REQUESTS NO ANESTHESIA

## 2020-03-23 NOTE — Telephone Encounter (Signed)
° °  Primary Cardiologist: Glenetta Hew, MD  Chart reviewed as part of pre-operative protocol coverage. Simple dental extractions are considered low risk procedures per guidelines and generally do not require any specific cardiac clearance. It is also generally accepted that for simple extractions and dental cleanings, there is no need to interrupt blood thinner therapy.   SBE prophylaxis is required for the patient.  I will route this recommendation to the requesting party via Epic fax function and remove from pre-op pool.  Please call with questions.  Deberah Pelton, NP 03/23/2020, 10:35 AM

## 2020-03-25 ENCOUNTER — Other Ambulatory Visit: Payer: Self-pay

## 2020-03-25 ENCOUNTER — Ambulatory Visit
Admission: RE | Admit: 2020-03-25 | Discharge: 2020-03-25 | Disposition: A | Discharge status: Home / Self Care | Payer: 59 | Source: Ambulatory Visit | Attending: Family | Admitting: Family

## 2020-03-25 ENCOUNTER — Other Ambulatory Visit: Payer: Self-pay | Admitting: Family

## 2020-03-25 DIAGNOSIS — R921 Mammographic calcification found on diagnostic imaging of breast: Secondary | ICD-10-CM

## 2020-03-25 DIAGNOSIS — Z853 Personal history of malignant neoplasm of breast: Secondary | ICD-10-CM

## 2020-04-06 ENCOUNTER — Other Ambulatory Visit: Payer: Self-pay | Admitting: Cardiology

## 2020-06-30 ENCOUNTER — Other Ambulatory Visit: Payer: Self-pay

## 2020-06-30 ENCOUNTER — Encounter: Payer: Self-pay | Admitting: Cardiology

## 2020-06-30 ENCOUNTER — Ambulatory Visit (INDEPENDENT_AMBULATORY_CARE_PROVIDER_SITE_OTHER): Payer: 59 | Admitting: Cardiology

## 2020-06-30 DIAGNOSIS — Z720 Tobacco use: Secondary | ICD-10-CM

## 2020-06-30 DIAGNOSIS — I10 Essential (primary) hypertension: Secondary | ICD-10-CM

## 2020-06-30 DIAGNOSIS — E785 Hyperlipidemia, unspecified: Secondary | ICD-10-CM

## 2020-06-30 DIAGNOSIS — R002 Palpitations: Secondary | ICD-10-CM

## 2020-06-30 DIAGNOSIS — I051 Rheumatic mitral insufficiency: Secondary | ICD-10-CM | POA: Diagnosis not present

## 2020-06-30 DIAGNOSIS — I341 Nonrheumatic mitral (valve) prolapse: Secondary | ICD-10-CM

## 2020-06-30 MED ORDER — METOPROLOL TARTRATE 25 MG PO TABS
ORAL_TABLET | ORAL | 6 refills | Status: DC
Start: 2020-06-30 — End: 2021-01-01

## 2020-06-30 NOTE — Patient Instructions (Signed)
Medication Instructions:   no changes  May take  Metoprolol tartrate 25 mg  -2 to 3 times a day as needed for heart fluttering this in additional to taking your regular  dose of Metoprolol succinate.  *If you need a refill on your cardiac medications before your next appointment, please call your pharmacy*   Lab Work: Not needed   Testing/Procedures: Not needed   Follow-Up: At [facility], you and your health needs are our priority.  As part of our continuing mission to provide you with exceptional heart care, we have created designated Provider Care Teams.  These Care Teams include your primary Cardiologist (physician) and Advanced Practice Providers (APPs -  Physician Assistants and Nurse Practitioners) who all work together to provide you with the care you need, when you need it.  We recommend signing up for the patient portal called "MyChart".  Sign up information is provided on this After Visit Summary.  MyChart is used to connect with patients for Virtual Visits (Telemedicine).  Patients are able to view lab/test results, encounter notes, upcoming appointments, etc.  Non-urgent messages can be sent to your provider as well.   To learn more about what you can do with MyChart, go to [email or web address].    Your next appointment:   12 month(s)  The format for your next appointment:   In Person  Provider:   Glenetta Hew, MD   Other Instructions

## 2020-06-30 NOTE — Progress Notes (Signed)
Primary Care Provider: Debbrah Alar, NP Cardiologist: Glenetta Hew, MD Electrophysiologist: None  Clinic Note: Chief Complaint  Patient presents with  . Follow-up    24-month; echo result  . Palpitations     HPI:    Katie Jacobson is a 64 y.o. female with a PMH notable for Breast Cancer open 2016-lumpectomy with chemo radiation completed September 2016)- hypertension, hyperlipidemia and palpitations, and moderate MR/MS who presents today for 53-month follow-up.  Katie Jacobson was last seen on January 02, 2020 as a delayed follow-up because of insurance issues.  She indicated that over the preceding years, she had lost weight but gained a lot of back.  She was having issues with allergies with congestion and postnasal drip.  Would like to use Sudafed, but remember that she should not with palpitations; palpitations well controlled on metoprolol --> 2D echo ordered   Recent Hospitalizations: N/A  Reviewed  CV studies:    The following studies were reviewed today: (if available, images/films reviewed: From Epic Chart or Care Everywhere) . TTE (January 16, 2020): EF 60 to 65%.  GRII DD.  No R WMA.  Normal RV size and function.  Rheumatic mitral valve with moderate thickening-hockey-stick appearing.  Moderate MR with mild MS.  Moderate LA dilation.  (Recommend follow-up in 2 to 3 years)  Interval History:   Katie Jacobson returns today overall doing fairly well.  She noted that 1 month ago she had pretty significant bout of allergies and cough.  The cough lasted all night long at least 5 to 10 hours.  After that she had a prolonged bout of rapid heart rates that lasted the next day.  It was not entire day but was coming and going off and on during that whole day.  She felt some irregularity but mostly just regular pounding heartbeats.  She indicated that the mid abdomen for the Covid issues she probably would have gone to the emergency room, but she decided to stay  home.  Otherwise has been doing okay the palpitations have been doing okay and not being that much of an issue for her.  She is very happy to hear the results of her echo but was not sure what the rheumatic mitral valve concept was all about.  When I explained to her, she actually remembered that there was probably a prolonged period time when she was young that she remembers having lots of pain and fevers and was very sick likely consistent with rheumatic fever.   At present, she is doing fairly well.  She is hoping to make an effort to try to lose weight.  She says at home her pressures look much better later today usually 120-125/60-75 mmHg.  He says that she has been rushing around all day long today and was running late for this appointment.  CV Review of Symptoms (Summary): no chest pain or dyspnea on exertion positive for - palpitations and rapid heart rate negative for - edema, irregular heartbeat, orthopnea, paroxysmal nocturnal dyspnea, shortness of breath or Syncope/near-syncope, TIA/amaurosis fugax , claudication  The patient does not have symptoms concerning for COVID-19 infection (fever, chills, cough, or new shortness of breath).   REVIEWED OF SYSTEMS   Review of Systems  Constitutional: Negative for malaise/fatigue and weight loss.  HENT: Positive for congestion and sinus pain. Negative for nosebleeds.   Respiratory: Positive for cough and shortness of breath (Only associated with congestion). Negative for sputum production.   Gastrointestinal: Negative for blood in stool  and melena.  Genitourinary: Negative for urgency.  Musculoskeletal: Positive for joint pain.  Endo/Heme/Allergies: Positive for environmental allergies.  Psychiatric/Behavioral: Negative for memory loss. The patient is not nervous/anxious and does not have insomnia.    I have reviewed and (if needed) personally updated the patient's problem list, medications, allergies, past medical and surgical history,  social and family history.   PAST MEDICAL HISTORY   Past Medical History:  Diagnosis Date  . Allergy   . Breast cancer of upper-outer quadrant of right female breast ([place]) 11/14/2014   Treated with chemotherapy and radiation  . History of chemotherapy    finished chemo 02/03/2015  . History of kidney stones   . History of seizures    as a child - unknown cause - states was never on anticonvulsants  . Hypertension    states under control with meds., has been on med. x "years"  . Mitral valve prolapse 1990   Follow-up echocardiogram not show mitral prolapse.  Shows mild mitral vegetation.  . Osteoporosis   . Personal history of chemotherapy   . Personal history of radiation therapy   . Seasonal allergies   . Seizures ([place])    as a child-none as a adult    PAST SURGICAL HISTORY   Past Surgical History:  Procedure Laterality Date  . ABDOMINAL HYSTERECTOMY  1998   partial  . APPENDECTOMY  1976  . BREAST LUMPECTOMY Right 02/2015  . COLONOSCOPY  09/30/2016  . KNEE ARTHROSCOPY Left 03/26/2008  . PLANTAR'S WART EXCISION Left    x 2  . PORT-A-CATH REMOVAL Left 04/06/2015   Procedure: REMOVAL PORT-A-CATH;  Surgeon: Rolm Bookbinder, MD;  Location: [facility];  Service: General;  Laterality: Left;  . PORTACATH PLACEMENT N/A 11/27/2014   Procedure: INSERTION PORT-A-CATH;  Surgeon: Rolm Bookbinder, MD;  Location: [facility];  Service: General;  Laterality: N/A;  . RADIOACTIVE SEED GUIDED PARTIAL MASTECTOMY WITH AXILLARY SENTINEL LYMPH NODE BIOPSY Right 03/02/2015   Procedure: RADIOACTIVE SEED GUIDED RIGHT PARTIAL MASTECTOMY WITH RIGHT AXILLARY SENTINEL LYMPH NODE BIOPSY;  Surgeon: Rolm Bookbinder, MD;  Location: [facility];  Service: General;  Laterality: Right;  . SALIVARY STONE REMOVAL Right 1972  . TONSILLECTOMY  1970s  . TRANSTHORACIC ECHOCARDIOGRAM  01/16/2020   EF 60 to 65%.  GRII DD.  No R WMA.  Normal RV size and function.   Rheumatic mitral valve with moderate thickening-hockey-stick appearing.  Moderate MR with mild MS.  Moderate LA dilation.  (Recommend follow-up in 2 to 3 years)  . TUBAL LIGATION  1996    Immunization History  Administered Date(s) Administered  . Influenza Split 07/06/2011  . Influenza,inj,Quad PF,6+ Mos 09/02/2013, 06/25/2015, 09/06/2016, 09/08/2017, 08/03/2018  . Pneumococcal Polysaccharide-23 07/06/2012  . Tdap 07/06/2011  . Zoster Recombinat (Shingrix) 08/10/2018, 09/12/2018    MEDICATIONS/ALLERGIES   Current Meds  Medication Sig  . albuterol (PROVENTIL HFA;VENTOLIN HFA) 108 (90 BASE) MCG/ACT inhaler Inhale into the lungs every 6 (six) hours as needed for wheezing or shortness of breath.  Marland Kitchen aspirin 81 MG tablet Take 1 tablet (81 mg total) by mouth daily.  . benzonatate (TESSALON) 100 MG capsule Take 1 capsule (100 mg total) by mouth at bedtime as needed for cough.  . chlorthalidone (HYGROTON) 25 MG tablet TAKE 1 TABLET(25 MG) BY MOUTH EVERY OTHER DAY  . cholecalciferol (VITAMIN D3) 25 MCG (1000 UT) tablet Take 1 tablet (1,000 Units total) by mouth daily.  . enalapril (VASOTEC) 20 MG tablet Take 1 tablet (  20 mg total) by mouth at bedtime.  . fluticasone (FLONASE) 50 MCG/ACT nasal spray Place 2 sprays into both nostrils as needed.  . metoprolol succinate (TOPROL-XL) 50 MG 24 hr tablet TAKE 1 TABLET BY MOUTH EVERY DAY  . montelukast (SINGULAIR) 10 MG tablet Take 1 tablet (10 mg total) by mouth at bedtime.    Allergies  Allergen Reactions  . Sulfa Antibiotics Other (See Comments)    UNKNOWN    SOCIAL HISTORY/FAMILY HISTORY   Reviewed in Epic:  Pertinent findings: No new changes  OBJCTIVE -PE, EKG, labs   Wt Readings from Last 3 Encounters:  06/30/20 223 lb (101.2 kg)  01/02/20 221 lb 3.2 oz (100.3 kg)  11/11/19 216 lb (98 kg)    Physical Exam: BP 138/70   Pulse 93   Ht 5\' 1"  (1.549 m)   Wt 223 lb (101.2 kg)   LMP 10/10/1996   SpO2 97%   BMI 42.14 kg/m  Physical  Exam Vitals reviewed.  Constitutional:      General: She is not in acute distress.    Appearance: Normal appearance. She is not ill-appearing.     Comments: Morbidly obese.  Well-groomed  HENT:     Head: Normocephalic and atraumatic.  Neck:     Vascular: No carotid bruit, hepatojugular reflux or JVD.  Cardiovascular:     Rate and Rhythm: Normal rate and regular rhythm.  No extrasystoles are present.    Chest Wall: PMI is not displaced (Unable to palpate).     Pulses: Decreased pulses (Mostly because of body habitus).     Heart sounds: S1 normal and S2 normal. Heart sounds are distant. Murmur heard. High-pitched harsh crescendo-decrescendo early systolic murmur is present with a grade of 2/6 at the upper right sternal border. High-pitched blowing decrescendo holosystolic murmur of grade 1/6 is also present at the apex.  Low-pitched rumbling crescendo presystolic murmur is present with a grade of 1/4 at the apex.  No gallop. No S4 sounds.   Musculoskeletal:     Cervical back: Normal range of motion.  Neurological:     Mental Status: She is alert.     Adult ECG Report Not checked  Recent Labs: October 2020-TC 190, TG 59, HDL 67, LDL 121 Lab Results  Component Value Date   CHOL 209 (H) 08/10/2018   HDL 76 08/10/2018   LDLCALC 116 (H) 08/10/2018   TRIG 73 08/10/2018   CHOLHDL 2.8 08/10/2018   Lab Results  Component Value Date   CREATININE 0.93 08/10/2018   BUN 13 08/10/2018   NA 140 08/10/2018   K 4.1 08/10/2018   CL 105 08/10/2018   CO2 24 08/10/2018   Lab Results  Component Value Date   TSH 0.83 10/15/2019    ASSESSMENT/PLAN   Problem List Items Addressed This Visit    Mitral valve prolapse (Chronic)    She really did have much in the way up to mitral prolapse, is probably more the hockey-stick appearance of her rheumatic mitral valve.  We will continue to monitor with echocardiograms every 2 to 3 years.      Relevant Medications   metoprolol tartrate  (LOPRESSOR) 25 MG tablet   Hyperlipidemia LDL goal <100 (Chronic)    LDL not quite at goal.  She does need to lose weight.  If there is not an improvement, low threshold to consider treatment with statin.      Relevant Medications   metoprolol tartrate (LOPRESSOR) 25 MG tablet   Intermittent palpitations (Chronic)  For the most part palpitations having control with a beta-blocker.  I told her to take as needed additional doses of Toprol.  Also provided her metoprolol tartrate 25 mg to take up to 3 times a day as needed for heart fluttering..      Essential hypertension (Chronic)    Blood pressure for the most part been pretty well controlled at home on chlorthalidone and enalapril.  This is helping her swelling.  She is taking Toprol 50 mg daily.  I recommend that she has fast heart rate spells that she can take an additional half tablet to full tablet depending on how fast her heart rate goes.      Relevant Medications   metoprolol tartrate (LOPRESSOR) 25 MG tablet   Tobacco abuse (Chronic)    Needs to quit smoking.  She jokingly changed the subject.      Mitral regurgitation due to cusp prolapse (Chronic)    As it turns out that appears that her mitral valve does not really have a true prolapse, but is more related to what may be rheumatic valvular disease.  With moderate mitral vegetation will follow up an echocardiogram every couple years..  For now main treatment is blood pressure control and rate control. Also weight loss.      Relevant Medications   metoprolol tartrate (LOPRESSOR) 25 MG tablet       COVID-19 Education: The signs and symptoms of COVID-19 were discussed with the patient and how to seek care for testing (follow up with PCP or arrange E-visit).   The importance of social distancing and COVID-19 vaccination was discussed today.  The patient is practicing social distancing & Masking.   I spent a total of 28 minutes with the patient spent in direct patient  consultation.  Additional time spent with chart review  / charting (studies, outside notes, etc): 13 Total Time: 23min   Current medicines are reviewed at length with the patient today.  (+/- concerns) n/a  Notice: This dictation was prepared with Dragon dictation along with smaller phrase technology. Any transcriptional errors that result from this process are unintentional and may not be corrected upon review.  Patient Instructions / Medication Changes & Studies & Tests Ordered   Patient Instructions  Medication Instructions:   no changes  May take  Metoprolol tartrate 25 mg  -2 to 3 times a day as needed for heart fluttering this in additional to taking your regular  dose of Metoprolol succinate.  *If you need a refill on your cardiac medications before your next appointment, please call your pharmacy*   Lab Work: Not needed   Testing/Procedures: Not needed   Follow-Up: At [facility], you and your health needs are our priority.  As part of our continuing mission to provide you with exceptional heart care, we have created designated Provider Care Teams.  These Care Teams include your primary Cardiologist (physician) and Advanced Practice Providers (APPs -  Physician Assistants and Nurse Practitioners) who all work together to provide you with the care you need, when you need it.  We recommend signing up for the patient portal called "MyChart".  Sign up information is provided on this After Visit Summary.  MyChart is used to connect with patients for Virtual Visits (Telemedicine).  Patients are able to view lab/test results, encounter notes, upcoming appointments, etc.  Non-urgent messages can be sent to your provider as well.   To learn more about what you can do with MyChart, go to [email or web address].  Your next appointment:   12 month(s)  The format for your next appointment:   In Person  Provider:   Glenetta Hew, MD   Other Instructions     Studies  Ordered:   No orders of the defined types were placed in this encounter.    Glenetta Hew, M.D., M.S. Interventional Cardiologist   Pager # [phone] Phone # [phone] [address]. [place], [place] [address]   Thank you for choosing Heartcare at [facility]!!

## 2020-07-06 ENCOUNTER — Encounter: Payer: Self-pay | Admitting: Cardiology

## 2020-07-06 NOTE — Assessment & Plan Note (Addendum)
>>  ASSESSMENT AND PLAN FOR MITRAL REGURGITATION DUE TO CUSP PROLAPSE WRITTEN ON 07/06/2020 11:13 PM BY Chiann Goffredo W, MD  As it turns out that appears that her mitral valve does not really have a true prolapse, but is more related to what may be rheumatic valvular disease.  With moderate mitral vegetation will follow up an echocardiogram every couple years..  For now main treatment is blood pressure control and rate control. Also weight loss.   >>ASSESSMENT AND PLAN FOR MITRAL VALVE PROLAPSE WRITTEN ON 07/06/2020 11:11 PM BY Bridgette Wolden W, MD  She really did have much in the way up to mitral prolapse, is probably more the hockey-stick appearance of her rheumatic mitral valve.  We will continue to monitor with echocardiograms every 2 to 3 years.

## 2020-07-06 NOTE — Assessment & Plan Note (Signed)
Needs to quit smoking.  She jokingly changed the subject.

## 2020-07-06 NOTE — Assessment & Plan Note (Signed)
For the most part palpitations having control with a beta-blocker.  I told her to take as needed additional doses of Toprol.  Also provided her metoprolol tartrate 25 mg to take up to 3 times a day as needed for heart fluttering.Katie Jacobson

## 2020-07-06 NOTE — Assessment & Plan Note (Signed)
LDL not quite at goal.  She does need to lose weight.  If there is not an improvement, low threshold to consider treatment with statin.

## 2020-07-06 NOTE — Assessment & Plan Note (Signed)
Blood pressure for the most part been pretty well controlled at home on chlorthalidone and enalapril.  This is helping her swelling.  She is taking Toprol 50 mg daily.  I recommend that she has fast heart rate spells that she can take an additional half tablet to full tablet depending on how fast her heart rate goes.

## 2020-07-06 NOTE — Assessment & Plan Note (Signed)
She really did have much in the way up to mitral prolapse, is probably more the hockey-stick appearance of her rheumatic mitral valve.  We will continue to monitor with echocardiograms every 2 to 3 years.

## 2020-08-03 ENCOUNTER — Ambulatory Visit
Admission: RE | Admit: 2020-08-03 | Discharge: 2020-08-03 | Disposition: A | Discharge status: Home / Self Care | Payer: 59 | Source: Ambulatory Visit | Attending: Family | Admitting: Family

## 2020-08-03 ENCOUNTER — Other Ambulatory Visit: Payer: Self-pay

## 2020-08-03 DIAGNOSIS — R921 Mammographic calcification found on diagnostic imaging of breast: Secondary | ICD-10-CM

## 2020-08-03 DIAGNOSIS — Z853 Personal history of malignant neoplasm of breast: Secondary | ICD-10-CM

## 2020-08-12 ENCOUNTER — Other Ambulatory Visit: Payer: Self-pay

## 2020-08-12 DIAGNOSIS — Z171 Estrogen receptor negative status [ER-]: Secondary | ICD-10-CM

## 2020-08-13 ENCOUNTER — Other Ambulatory Visit: Payer: Self-pay

## 2020-08-13 ENCOUNTER — Inpatient Hospital Stay: Payer: 59 | Attending: Oncology | Admitting: Oncology

## 2020-08-13 ENCOUNTER — Inpatient Hospital Stay: Payer: 59

## 2020-08-13 VITALS — BP 148/63 | HR 77 | Temp 97.1°F | Resp 18 | Ht 61.0 in | Wt 227.0 lb

## 2020-08-13 DIAGNOSIS — I341 Nonrheumatic mitral (valve) prolapse: Secondary | ICD-10-CM | POA: Insufficient documentation

## 2020-08-13 DIAGNOSIS — Z803 Family history of malignant neoplasm of breast: Secondary | ICD-10-CM | POA: Diagnosis not present

## 2020-08-13 DIAGNOSIS — Z923 Personal history of irradiation: Secondary | ICD-10-CM | POA: Insufficient documentation

## 2020-08-13 DIAGNOSIS — Z7289 Other problems related to lifestyle: Secondary | ICD-10-CM | POA: Diagnosis not present

## 2020-08-13 DIAGNOSIS — C50411 Malignant neoplasm of upper-outer quadrant of right female breast: Secondary | ICD-10-CM | POA: Insufficient documentation

## 2020-08-13 DIAGNOSIS — Z8249 Family history of ischemic heart disease and other diseases of the circulatory system: Secondary | ICD-10-CM | POA: Insufficient documentation

## 2020-08-13 DIAGNOSIS — I10 Essential (primary) hypertension: Secondary | ICD-10-CM | POA: Insufficient documentation

## 2020-08-13 DIAGNOSIS — Z833 Family history of diabetes mellitus: Secondary | ICD-10-CM | POA: Insufficient documentation

## 2020-08-13 DIAGNOSIS — F17210 Nicotine dependence, cigarettes, uncomplicated: Secondary | ICD-10-CM | POA: Diagnosis not present

## 2020-08-13 DIAGNOSIS — Z882 Allergy status to sulfonamides status: Secondary | ICD-10-CM | POA: Insufficient documentation

## 2020-08-13 DIAGNOSIS — Z171 Estrogen receptor negative status [ER-]: Secondary | ICD-10-CM | POA: Insufficient documentation

## 2020-08-13 DIAGNOSIS — Z79899 Other long term (current) drug therapy: Secondary | ICD-10-CM | POA: Insufficient documentation

## 2020-08-13 DIAGNOSIS — Z841 Family history of disorders of kidney and ureter: Secondary | ICD-10-CM | POA: Insufficient documentation

## 2020-08-13 DIAGNOSIS — Z87442 Personal history of urinary calculi: Secondary | ICD-10-CM | POA: Diagnosis not present

## 2020-08-13 DIAGNOSIS — Z8371 Family history of colonic polyps: Secondary | ICD-10-CM | POA: Diagnosis not present

## 2020-08-13 DIAGNOSIS — Z823 Family history of stroke: Secondary | ICD-10-CM | POA: Diagnosis not present

## 2020-08-13 DIAGNOSIS — Z818 Family history of other mental and behavioral disorders: Secondary | ICD-10-CM | POA: Diagnosis not present

## 2020-08-13 DIAGNOSIS — Z9049 Acquired absence of other specified parts of digestive tract: Secondary | ICD-10-CM | POA: Diagnosis not present

## 2020-08-13 DIAGNOSIS — Z9221 Personal history of antineoplastic chemotherapy: Secondary | ICD-10-CM | POA: Diagnosis not present

## 2020-08-13 LAB — CBC WITH DIFFERENTIAL (CANCER CENTER ONLY)
Abs Immature Granulocytes: 0.01 10*3/uL (ref 0.00–0.07)
Basophils Absolute: 0.1 10*3/uL (ref 0.0–0.1)
Basophils Relative: 1 %
Eosinophils Absolute: 0.2 10*3/uL (ref 0.0–0.5)
Eosinophils Relative: 3 %
HCT: 42.6 % (ref 36.0–46.0)
Hemoglobin: 14.1 g/dL (ref 12.0–15.0)
Immature Granulocytes: 0 %
Lymphocytes Relative: 39 %
Lymphs Abs: 2.5 10*3/uL (ref 0.7–4.0)
MCH: 31.3 pg (ref 26.0–34.0)
MCHC: 33.1 g/dL (ref 30.0–36.0)
MCV: 94.5 fL (ref 80.0–100.0)
Monocytes Absolute: 0.7 10*3/uL (ref 0.1–1.0)
Monocytes Relative: 10 %
Neutro Abs: 3.1 10*3/uL (ref 1.7–7.7)
Neutrophils Relative %: 47 %
Platelet Count: 231 10*3/uL (ref 150–400)
RBC: 4.51 MIL/uL (ref 3.87–5.11)
RDW: 15.6 % — ABNORMAL HIGH (ref 11.5–15.5)
WBC Count: 6.5 10*3/uL (ref 4.0–10.5)
nRBC: 0 % (ref 0.0–0.2)

## 2020-08-13 LAB — CMP (CANCER CENTER ONLY)
ALT: 16 U/L (ref 0–44)
AST: 15 U/L (ref 15–41)
Albumin: 3.5 g/dL (ref 3.5–5.0)
Alkaline Phosphatase: 76 U/L (ref 38–126)
Anion gap: 8 (ref 5–15)
BUN: 13 mg/dL (ref 8–23)
CO2: 27 mmol/L (ref 22–32)
Calcium: 9.4 mg/dL (ref 8.9–10.3)
Chloride: 108 mmol/L (ref 98–111)
Creatinine: 0.86 mg/dL (ref 0.44–1.00)
GFR, Estimated: >60 mL/min (ref >60)
Glucose, Bld: 90 mg/dL (ref 70–99)
Potassium: 4.5 mmol/L (ref 3.5–5.1)
Sodium: 143 mmol/L (ref 135–145)
Total Bilirubin: 0.3 mg/dL (ref 0.3–1.2)
Total Protein: 7.1 g/dL (ref 6.5–8.1)

## 2020-08-13 LAB — MAGNESIUM: Magnesium: 1.9 mg/dL (ref 1.7–2.4)

## 2020-08-13 NOTE — Progress Notes (Signed)
[facility]  Telephone:([phone] Fax:([phone]) [phone]     ID: Katie Jacobson DOB: 1956/05/17  MR#: [id number]  QIH#:[id number]  Patient Care Team: [name], NP as PCP - General (Internal Medicine) Marykay Lex, MD as PCP - Cardiology (Cardiology) Miguel Aschoff, MD (Inactive) (Obstetrics and Gynecology) Emelia Loron, MD as Consulting Physician (General Surgery) Baya Lentz, Valentino Hue, MD as Consulting Physician (Oncology) Lonie Peak, MD as Attending Physician (Radiation Oncology) Donnelly Angelica, RN as Registered Nurse Pershing Proud, RN as Registered Nurse Armbruster, Willaim Rayas, MD as Consulting Physician (Gastroenterology) OTHER MD:  CHIEF COMPLAINT: Triple negative breast cancer  CURRENT TREATMENT: observation   INTERVAL HISTORY: Katie Jacobson returns today for follow-up of her triple negative breast cancer. She continues on observation.   Since her last visit, she underwent short-term follow up right diagnostic mammogram on 03/25/2020 for previously seen calcifications. This showed: breast density category B; probably-benign right breast calcifications. Recommendation was for continued short-term follow up to coincide with patient's annual mammography.  As such, she underwent bilateral diagnostic mammography with tomography at [facility] on 08/03/2020 showing: breast density category B; no evidence of malignancy in either breast.   She also underwent colonoscopy on 11/11/2019 under Dr. Adela Lank. Pathology from the procedure ([id number]) showed a tubular adenoma. Recommendation is for repeat colonoscopy in 7 years.   REVIEW OF SYSTEMS: Taylre tells me she did apply for disability and she had a long test with a neuropsychiatrist and then with a physiognomy for.  She still remembers the 3 things she was supposed to remember she says.  She was not given any results she says.  She feels a little wobbly and like she has bad balance.  This is not  a new problem.  It comes and goes.  It is not associated with severe headaches, nausea, vomiting, and she has had no falls.  She does have some problems with right upper extremity lymphedema but she wears a sleeve most of the time.  A detailed review of systems today was otherwise stable   COVID 19 VACCINATION STATUS: Status post vaccine x2, no booster yet   BREAST CANCER HISTORY: From the original intake note:  The patient herself palpated a mass in her right breast there is she brought it to her gynecologist, Dr. Charlott Rakes attention and he obtained screening mammography which suggested an abnormality in the right breast. On 10/24/2014 the patient underwent right diagnostic mammography [facility]. The breast density was category B. There was an ill-defined mass in the upper outer quadrant of the right breast which was palpable on exam. By ultrasound this was irregular and hypoechoic measuring 1.2 cm. Evaluation of the right axilla was negative.  Biopsy of the mass in question showed an invasive ductal carcinoma, grade 2 or 3, triple negative, with an MIB-1 of 19%.  Ms. Silbert case was presented at the multidisciplinary breast cancer conference 11/19/2014, where was suggested she would benefit from genetics testing. Since this would mandate a significant delay in her final surgery, neoadjuvant chemotherapy was indicated.  The patient's subsequent history is as detailed below   PAST MEDICAL HISTORY: Past Medical History:  Diagnosis Date  . Allergy   . Breast cancer of upper-outer quadrant of right female breast (HCC) 11/14/2014   Treated with chemotherapy and radiation  . History of chemotherapy    finished chemo 02/03/2015  . History of kidney stones   . History of seizures    as a child -  unknown cause - states was never on anticonvulsants  . Hypertension    states under control with meds., has been on med. x "years"  . Mitral valve prolapse 1990    Follow-up echocardiogram not show mitral prolapse.  Shows mild mitral vegetation.  . Osteoporosis   . Personal history of chemotherapy   . Personal history of radiation therapy   . Seasonal allergies   . Seizures (HCC)    as a child-none as a adult    PAST SURGICAL HISTORY: Past Surgical History:  Procedure Laterality Date  . ABDOMINAL HYSTERECTOMY  1998   partial  . APPENDECTOMY  1976  . BREAST LUMPECTOMY Right 02/2015  . COLONOSCOPY  09/30/2016  . KNEE ARTHROSCOPY Left 03/26/2008  . PLANTAR'S WART EXCISION Left    x 2  . PORT-A-CATH REMOVAL Left 04/06/2015   Procedure: REMOVAL PORT-A-CATH;  Surgeon: Emelia Loron, MD;  Location: [facility];  Service: General;  Laterality: Left;  . PORTACATH PLACEMENT N/A 11/27/2014   Procedure: INSERTION PORT-A-CATH;  Surgeon: Emelia Loron, MD;  Location: [facility];  Service: General;  Laterality: N/A;  . RADIOACTIVE SEED GUIDED PARTIAL MASTECTOMY WITH AXILLARY SENTINEL LYMPH NODE BIOPSY Right 03/02/2015   Procedure: RADIOACTIVE SEED GUIDED RIGHT PARTIAL MASTECTOMY WITH RIGHT AXILLARY SENTINEL LYMPH NODE BIOPSY;  Surgeon: Emelia Loron, MD;  Location:  SURGERY CENTER;  Service: General;  Laterality: Right;  . SALIVARY STONE REMOVAL Right 1972  . TONSILLECTOMY  1970s  . TRANSTHORACIC ECHOCARDIOGRAM  01/16/2020   EF 60 to 65%.  GRII DD.  No R WMA.  Normal RV size and function.  Rheumatic mitral valve with moderate thickening-hockey-stick appearing.  Moderate MR with mild MS.  Moderate LA dilation.  (Recommend follow-up in 2 to 3 years)  . TUBAL LIGATION  1996    FAMILY HISTORY Family History  Problem Relation Age of Onset  . Heart disease Mother   . Stroke Mother   . Hypertension Mother   . Diabetes Mother   . Diabetes Sister   . Kidney disease Brother   . Mental retardation Brother   . Diabetes Sister   . Heart attack Father   . Breast cancer Cousin        deceased 64  . Colon polyps  Daughter   . Colon cancer Neg Hx   . Esophageal cancer Neg Hx   . Rectal cancer Neg Hx   . Stomach cancer Neg Hx    the patient knows little about her father. Her mother died at at the age of 50 following a stroke. The patient had 2 brothers, 2 sisters. The only history of breast or ovarian cancer in the family is a first cousin on the mother's side diagnosed with breast cancer at age 70   GYNECOLOGIC HISTORY:  Patient's last menstrual period was 10/10/1996. Menarche age 88, first live birth age 51. The patient is GX P2. She status post simple hysterectomy without salpingo-oophorectomy. She did not take hormone replacement.   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 The patient's husband, Windy Fast "Ron" Olvey works for [employer] in the pattern shop. He also works as a Geologist, engineering. Daughter Betsey Gerstle lives in [place] and works in Presenter, broadcasting. Daughter [name] Looby died in an AA 09/11/16.    ADVANCED DIRECTIVES: Not in place   HEALTH MAINTENANCE: Social History   Tobacco Use  . Smoking status: Current Every Day Smoker    Packs/day:  0.50    Years: 43.00    Pack years: 21.50    Types: Cigarettes  . Smokeless tobacco: Former Neurosurgeon    Quit date: 09/09/2016  . Tobacco comment: 10 cig./day  Vaping Use  . Vaping Use: Never used  Substance Use Topics  . Alcohol use: Yes    Alcohol/week: 5.0 standard drinks    Types: 5 Standard drinks or equivalent per week    Comment: occ   . Drug use: No     Colonoscopy: 11/2019, (Dr. Adela Lank), repeat due 2028  PAP:  Bone density:  Lipid panel:  Allergies  Allergen Reactions  . Sulfa Antibiotics Other (See Comments)    UNKNOWN    Current Outpatient Medications  Medication Sig Dispense Refill  . albuterol (PROVENTIL HFA;VENTOLIN HFA) 108 (90 BASE) MCG/ACT inhaler Inhale into the lungs every 6 (six) hours as needed for wheezing or shortness of breath.    Marland Kitchen aspirin 81 MG tablet Take 1  tablet (81 mg total) by mouth daily. 30 tablet 0  . benzonatate (TESSALON) 100 MG capsule Take 1 capsule (100 mg total) by mouth at bedtime as needed for cough. 30 capsule 0  . chlorthalidone (HYGROTON) 25 MG tablet TAKE 1 TABLET(25 MG) BY MOUTH EVERY OTHER DAY 45 tablet 3  . cholecalciferol (VITAMIN D3) 25 MCG (1000 UT) tablet Take 1 tablet (1,000 Units total) by mouth daily.    . enalapril (VASOTEC) 20 MG tablet Take 1 tablet (20 mg total) by mouth at bedtime. 90 tablet 3  . fluticasone (FLONASE) 50 MCG/ACT nasal spray Place 2 sprays into both nostrils as needed. 9.9 mL 5  . metoprolol succinate (TOPROL-XL) 50 MG 24 hr tablet TAKE 1 TABLET BY MOUTH EVERY DAY 30 tablet 6  . metoprolol tartrate (LOPRESSOR) 25 MG tablet Take 25 mg by mouth  2 to 3 times a day as needed for heart fluttering 30 tablet 6  . montelukast (SINGULAIR) 10 MG tablet Take 1 tablet (10 mg total) by mouth at bedtime. 30 tablet 3   No current facility-administered medications for this visit.    OBJECTIVE: African-American woman in no acute distress  Vitals:   08/13/20 1446  BP: (!) 148/63  Pulse: 77  Resp: 18  Temp: (!) 97.1 F (36.2 C)  SpO2: 100%     Body mass index is 42.89 kg/m.    ECOG FS:1 - Symptomatic but completely ambulatory  Sclerae unicteric, EOMs intact Wearing a mask No cervical or supraclavicular adenopathy Lungs no rales or rhonchi Heart regular rate and rhythm Abd soft, obese, nontender, positive bowel sounds MSK no focal spinal tenderness, no upper extremity lymphedema Neuro: nonfocal, well oriented, appropriate affect Breasts: The right breast has undergone lumpectomy and radiation.  There is no evidence of local recurrence.  The left breast is benign.  Both axillae are benign.   LAB RESULTS:  CMP     Component Value Date/Time   NA 140 08/10/2018 1507   NA 139 12/28/2016 1359   K 4.1 08/10/2018 1507   K 3.6 12/28/2016 1359   CL 105 08/10/2018 1507   CO2 24 08/10/2018 1507   CO2 25  12/28/2016 1359   GLUCOSE 73 08/10/2018 1507   GLUCOSE 85 12/28/2016 1359   BUN 13 08/10/2018 1507   BUN 15.6 12/28/2016 1359   CREATININE 0.93 08/10/2018 1507   CREATININE 0.9 12/28/2016 1359   CALCIUM 9.3 08/10/2018 1507   CALCIUM 9.7 12/28/2016 1359   PROT 6.7 08/10/2018 1507   PROT 7.5 12/28/2016  1359   ALBUMIN 3.4 (L) 12/28/2017 1342   ALBUMIN 3.7 12/28/2016 1359   AST 15 08/10/2018 1507   AST 14 12/28/2016 1359   ALT 14 08/10/2018 1507   ALT 17 12/28/2016 1359   ALKPHOS 81 12/28/2017 1342   ALKPHOS 86 12/28/2016 1359   BILITOT 0.5 08/10/2018 1507   BILITOT 0.35 12/28/2016 1359   GFRNONAA >60 12/28/2017 1342   GFRNONAA 70 09/02/2013 1128   GFRAA >60 12/28/2017 1342   GFRAA 81 09/02/2013 1128    INo results found for: SPEP, UPEP  Lab Results  Component Value Date   WBC 6.5 08/13/2020   NEUTROABS 3.1 08/13/2020   HGB 14.1 08/13/2020   HCT 42.6 08/13/2020   MCV 94.5 08/13/2020   PLT 231 08/13/2020      Chemistry      Component Value Date/Time   NA 140 08/10/2018 1507   NA 139 12/28/2016 1359   K 4.1 08/10/2018 1507   K 3.6 12/28/2016 1359   CL 105 08/10/2018 1507   CO2 24 08/10/2018 1507   CO2 25 12/28/2016 1359   BUN 13 08/10/2018 1507   BUN 15.6 12/28/2016 1359   CREATININE 0.93 08/10/2018 1507   CREATININE 0.9 12/28/2016 1359      Component Value Date/Time   CALCIUM 9.3 08/10/2018 1507   CALCIUM 9.7 12/28/2016 1359   ALKPHOS 81 12/28/2017 1342   ALKPHOS 86 12/28/2016 1359   AST 15 08/10/2018 1507   AST 14 12/28/2016 1359   ALT 14 08/10/2018 1507   ALT 17 12/28/2016 1359   BILITOT 0.5 08/10/2018 1507   BILITOT 0.35 12/28/2016 1359       No results found for: LABCA2  No components found for: LABCA125  No results for input(s): INR in the last 168 hours.  Urinalysis    Component Value Date/Time   COLORURINE YELLOW 08/10/2018 1507   APPEARANCEUR TURBID (A) 08/10/2018 1507   LABSPEC 1.024 08/10/2018 1507   PHURINE 5.5 08/10/2018 1507    GLUCOSEU NEGATIVE 08/10/2018 1507   GLUCOSEU NEGATIVE 09/06/2016 1225   HGBUR 1+ (A) 08/10/2018 1507   BILIRUBINUR NEGATIVE 09/06/2016 1225   KETONESUR 1+ (A) 08/10/2018 1507   PROTEINUR NEGATIVE 08/10/2018 1507   UROBILINOGEN 0.2 09/06/2016 1225   NITRITE NEGATIVE 08/10/2018 1507   LEUKOCYTESUR NEGATIVE 08/10/2018 1507    STUDIES: MM DIAG BREAST TOMO BILATERAL  Result Date: 08/03/2020 CLINICAL DATA:  64 year old female for follow-up of calcifications at the RIGHT lumpectomy site and for annual bilateral follow-up. History of RIGHT breast cancer and lumpectomy in 2016. EXAM: DIGITAL DIAGNOSTIC BILATERAL MAMMOGRAM WITH CAD AND TOMO COMPARISON:  Previous exam(s). ACR Breast Density Category b: There are scattered areas of fibroglandular density. FINDINGS: 2D and 3D full field views of both breasts and a magnification view of the lumpectomy site demonstrate no suspicious mass, nonsurgical distortion or worrisome calcifications. RIGHT lumpectomy changes with associated benign coarse dystrophic calcifications are again noted. Mammographic images were processed with CAD. IMPRESSION: No evidence of breast malignancy. RECOMMENDATION: Bilateral screening mammogram in 1 year. I have discussed the findings and recommendations with the patient. If applicable, a reminder letter will be sent to the patient regarding the next appointment. BI-RADS CATEGORY  2: Benign. Electronically Signed   By: Harmon Pier M.D.   On: 08/03/2020 15:35      ASSESSMENT: 64 y.o. [place], [place] woman status post right breast upper outer quadrant biopsy 11/12/2014 for a clinical T1c N0, stage IA invasive ductal carcinoma, triple negative,  with an MIB-1 of 19%  (1) neoadjuvant chemotherapy consisting of cyclophosphamide and docetaxel 4 given every 21 days with Neulasta support, first dose 12/02/2014, completed 02/03/2015  (2) genetics testing through the [facility] gene panel at [facility] showed no deleterious  mutations in ATM, BARD1, BRCA1, BRCA2, BRIP1, CDH1, CHEK2, MRE11A, MUTYH, NBN, NF1, PALB2, PTEN, RAD50, RAD51C, RAD51D, oe TP53.  (3) status post right lumpectomy and sentinel node sampling 03/02/2015 for a ypT1b ypN0 stage IA invasive ductal carcinoma, grade 1, with negative margins. Repeat HER-2 was again not amplified.  (4) radiation therapy completed August 2016   PLAN: Menda is now 6-1/2 years out from definitive surgery for her breast cancer with no evidence of disease recurrence.  This is very favorable.  As before she understands I would not be uncomfortable releasing her to her primary care physician but she does feel reassured coming here once a year and we can certainly accommodate that.  She is on 3 blood pressure medicines and possibly that is the reason she feels a little wobbly at times.  However she has had no falls and that is very favorable.  I have discussed fall precautions with her in detail  We reviewed her mammograms and she understands that having a low density in her breast makes the mammograms easier to read and more reliable.  Otherwise she will return to see me in 1 year.  She knows to call for any other issue that may develop before then  Total encounter time 25 minutes.  Apolo Cutshaw, Valentino Hue, MD  08/13/20 2:57 PM Medical Oncology and Hematology [facility] [address] [place], [place] [address] Tel. [phone]    Fax. [phone]   I, Mickie Bail, am acting as scribe for Dr. Valentino Hue. Gedalya Jim.  I, Ruthann Cancer MD, have reviewed the above documentation for accuracy and completeness, and I agree with the above.   *Total Encounter Time as defined by the Centers for Medicare and Medicaid Services includes, in addition to the face-to-face time of a patient visit (documented in the note above) non-face-to-face time: obtaining and reviewing outside history, ordering and reviewing medications, tests or procedures, care coordination  (communications with other health care professionals or caregivers) and documentation in the medical record.

## 2020-09-11 ENCOUNTER — Ambulatory Visit: Payer: 59 | Attending: Internal Medicine

## 2020-09-11 ENCOUNTER — Other Ambulatory Visit (HOSPITAL_BASED_OUTPATIENT_CLINIC_OR_DEPARTMENT_OTHER): Payer: Self-pay | Admitting: Internal Medicine

## 2020-09-11 DIAGNOSIS — Z23 Encounter for immunization: Secondary | ICD-10-CM

## 2020-09-11 NOTE — Progress Notes (Signed)
° °  Covid-19 Vaccination Clinic  Name:  Katie Jacobson    MRN: [id number] DOB: 03-Feb-1956  09/11/2020  Katie Jacobson was observed post Covid-19 immunization for 15 minutes without incident. She was provided with Vaccine Information Sheet and instruction to access the V-Safe system.   Katie Jacobson was instructed to call 911 with any severe reactions post vaccine:  Difficulty breathing   Swelling of face and throat   A fast heartbeat   A bad rash all over body   Dizziness and weakness   Immunizations Administered    Name Date Dose VIS Date [place] COVID-19 Vaccine 09/11/2020 11:02 AM 0.3 mL 07/29/2020 Intramuscular   Manufacturer: [employer]   Lot: [id number]   Canon: [id number]

## 2020-09-15 MED FILL — PFIZER-BIONTECH COVID-19 VA: 30 | 1 days supply | Qty: 0 | Fill #0

## 2020-11-12 ENCOUNTER — Other Ambulatory Visit: Payer: Self-pay

## 2020-11-13 ENCOUNTER — Encounter: Payer: Self-pay | Admitting: Family

## 2020-11-13 ENCOUNTER — Ambulatory Visit (INDEPENDENT_AMBULATORY_CARE_PROVIDER_SITE_OTHER): Payer: 59 | Admitting: Family

## 2020-11-13 VITALS — BP 145/67 | HR 78 | Temp 99.2°F | Resp 16 | Ht 61.0 in | Wt 220.6 lb

## 2020-11-13 DIAGNOSIS — Z0000 Encounter for general adult medical examination without abnormal findings: Secondary | ICD-10-CM

## 2020-11-13 DIAGNOSIS — Z23 Encounter for immunization: Secondary | ICD-10-CM

## 2020-11-13 NOTE — Progress Notes (Signed)
Subjective:    Patient ID: Katie Jacobson, female    DOB: 1956-06-26, 65 y.o.   MRN: [id number]  HPI  Patient presents today for complete physical.  Immunizations: declines tetanus today.  Flu shot today.  Shingrix complete.   Diet: She is pleased that her weight is down Wt Readings from Last 3 Encounters:  11/13/20 220 lb 9.6 oz (100.1 kg)  08/13/20 227 lb (103 kg)  06/30/20 223 lb (101.2 kg)  Exercise:  Not exercising regularly Colonoscopy: 2020/02/03 Dexa: reports this was done with the cancer center Pap Smear: hysterectomy Mammogram: 10/25     Review of Systems  Constitutional: Negative for unexpected weight change.  HENT: Negative for rhinorrhea.   Eyes: Negative for visual disturbance (floaters, cataract).  Respiratory: Negative for cough and shortness of breath.   Cardiovascular: Negative for chest pain.  Gastrointestinal: Negative for blood in stool, constipation and diarrhea.  Genitourinary: Negative for dysuria, frequency and hematuria.  Musculoskeletal: Positive for arthralgias and back pain.  Neurological: Negative for headaches.  Hematological: Negative for adenopathy.  Psychiatric/Behavioral:       Denies depression/anxiety   Past Medical History:  Diagnosis Date  . Allergy   . Breast cancer of upper-outer quadrant of right female breast ([place]) 11/14/2014   Treated with chemotherapy and radiation  . History of chemotherapy    finished chemo 02/03/2015  . History of kidney stones   . History of seizures    as a child - unknown cause - states was never on anticonvulsants  . Hypertension    states under control with meds., has been on med. x "years"  . Mitral valve prolapse 1990   Follow-up echocardiogram not show mitral prolapse.  Shows mild mitral vegetation.  . Osteoporosis   . Personal history of chemotherapy   . Personal history of radiation therapy   . Seasonal allergies   . Seizures ([place])    as a child-none as a adult     Social History    Socioeconomic History  . Marital status: Married    Spouse name: Not on file  . Number of children: 2  . Years of education: Not on file  . Highest education level: Not on file  Occupational History  . Not on file  Tobacco Use  . Smoking status: Current Every Day Smoker    Packs/day: 0.50    Years: 43.00    Pack years: 21.50    Types: Cigarettes  . Smokeless tobacco: Former Systems developer    Quit date: 09/09/2016  . Tobacco comment: 10 cig./day  Vaping Use  . Vaping Use: Never used  Substance and Sexual Activity  . Alcohol use: Yes    Alcohol/week: 5.0 standard drinks    Types: 5 Standard drinks or equivalent per week    Comment: occ   . Drug use: No  . Sexual activity: Not on file  Other Topics Concern  . 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  Has been doing part time work with [employer] -- had 6 active clients, but the most important one is her Daughter.   Regular exercise:  No   Caffeine Use: none; Current 1ppd smoker x > 39 yrs; EtOH ~4 oz/week   Social Determinants of Health   Financial Resource Strain: Not on file  Food Insecurity:  Not on file  Transportation Needs: Not on file  Physical Activity: Not on file  Stress: Not on file  Social Connections: Not on file  Intimate Partner Violence: Not on file    Past Surgical History:  Procedure Laterality Date  . ABDOMINAL HYSTERECTOMY  1998   partial  . APPENDECTOMY  1976  . BREAST LUMPECTOMY Right 02/2015  . COLONOSCOPY  09/30/2016  . KNEE ARTHROSCOPY Left 03/26/2008  . PLANTAR'S WART EXCISION Left    x 2  . PORT-A-CATH REMOVAL Left 04/06/2015   Procedure: REMOVAL PORT-A-CATH;  Surgeon: Rolm Bookbinder, MD;  Location: [facility];  Service: General;  Laterality: Left;  . PORTACATH PLACEMENT N/A 11/27/2014   Procedure: INSERTION PORT-A-CATH;   Surgeon: Rolm Bookbinder, MD;  Location: [facility];  Service: General;  Laterality: N/A;  . RADIOACTIVE SEED GUIDED PARTIAL MASTECTOMY WITH AXILLARY SENTINEL LYMPH NODE BIOPSY Right 03/02/2015   Procedure: RADIOACTIVE SEED GUIDED RIGHT PARTIAL MASTECTOMY WITH RIGHT AXILLARY SENTINEL LYMPH NODE BIOPSY;  Surgeon: Rolm Bookbinder, MD;  Location: [facility];  Service: General;  Laterality: Right;  . SALIVARY STONE REMOVAL Right 1972  . TONSILLECTOMY  1970s  . TRANSTHORACIC ECHOCARDIOGRAM  01/16/2020   EF 60 to 65%.  GRII DD.  No R WMA.  Normal RV size and function.  Rheumatic mitral valve with moderate thickening-hockey-stick appearing.  Moderate MR with mild MS.  Moderate LA dilation.  (Recommend follow-up in 2 to 3 years)  . TUBAL LIGATION  1996    Family History  Problem Relation Age of Onset  . Heart disease Mother   . Stroke Mother   . Hypertension Mother   . Diabetes Mother   . Diabetes Sister   . Kidney disease Brother   . Mental retardation Brother   . Diabetes Sister   . Heart attack Father   . Breast cancer Cousin        deceased 61  . Colon polyps Daughter   . Colon cancer Neg Hx   . Esophageal cancer Neg Hx   . Rectal cancer Neg Hx   . Stomach cancer Neg Hx     Allergies  Allergen Reactions  . Sulfa Antibiotics Other (See Comments)    UNKNOWN    Current Outpatient Medications on File Prior to Visit  Medication Sig Dispense Refill  . albuterol (PROVENTIL HFA;VENTOLIN HFA) 108 (90 BASE) MCG/ACT inhaler Inhale into the lungs every 6 (six) hours as needed for wheezing or shortness of breath.    Marland Kitchen aspirin 81 MG tablet Take 1 tablet (81 mg total) by mouth daily. 30 tablet 0  . chlorthalidone (HYGROTON) 25 MG tablet TAKE 1 TABLET(25 MG) BY MOUTH EVERY OTHER DAY 45 tablet 3  . cholecalciferol (VITAMIN D3) 25 MCG (1000 UT) tablet Take 1 tablet (1,000 Units total) by mouth daily.    . enalapril (VASOTEC) 20 MG tablet Take 1 tablet (20 mg  total) by mouth at bedtime. 90 tablet 3  . fluticasone (FLONASE) 50 MCG/ACT nasal spray Place 2 sprays into both nostrils as needed. 9.9 mL 5  . metoprolol succinate (TOPROL-XL) 50 MG 24 hr tablet TAKE 1 TABLET BY MOUTH EVERY DAY 30 tablet 6  . metoprolol tartrate (LOPRESSOR) 25 MG tablet Take 25 mg by mouth  2 to 3 times a day as needed for heart fluttering 30 tablet 6   No current facility-administered medications on file prior to visit.    BP (!) 145/67 (BP Location: Left Arm, Patient Position: Sitting,  Cuff Size: Large)   Pulse 78   Temp 99.2 F (37.3 C) (Oral)   Resp 16   Ht 5\' 1"  (1.549 m)   Wt 220 lb 9.6 oz (100.1 kg)   LMP 10/10/1996   SpO2 99%   BMI 41.68 kg/m        Objective:   Physical Exam  Physical Exam  Constitutional: She is oriented to person, place, and time. She appears well-developed and well-nourished. No distress.  HENT:  Head: Normocephalic and atraumatic.  Right Ear: Tympanic membrane and ear canal normal.  Left Ear: Tympanic membrane and ear canal normal.  Mouth/Throat: not examined.  Eyes: Pupils are equal, round, and reactive to light. No scleral icterus.  Neck: Normal range of motion. No thyromegaly present.  Cardiovascular: Normal rate and regular rhythm.   No murmur heard. Pulmonary/Chest: Effort normal and breath sounds normal. No respiratory distress. He has no wheezes. She has no rales. She exhibits no tenderness.  Abdominal: Soft. Bowel sounds are normal. She exhibits no distension and no mass. There is no tenderness. There is no rebound and no guarding.  Musculoskeletal: She exhibits no edema.  Lymphadenopathy:    She has no cervical adenopathy.  Neurological: She is alert and oriented to person, place, and time. She has normal patellar reflexes. She exhibits normal muscle tone. Coordination normal.  Skin: Skin is warm and dry.  Psychiatric: She has a normal mood and affect. Her behavior is normal. Judgment and thought content normal.   Breast/pelvic: deferred           Assessment & Plan:   Preventative care- discussed diet/exercise/weight loss. Mammogram up to date. Flu shot today.  Declines tetanus.   [place] up to date.  This visit occurred during the SARS-CoV-2 public health emergency.  Safety protocols were in place, including screening questions prior to the visit, additional usage of staff PPE, and extensive cleaning of exam room while observing appropriate contact time as indicated for disinfecting solutions.         Assessment & Plan:

## 2021-01-01 ENCOUNTER — Other Ambulatory Visit: Payer: Self-pay | Admitting: Cardiology

## 2021-01-31 ENCOUNTER — Other Ambulatory Visit: Payer: Self-pay | Admitting: Cardiology

## 2021-02-01 ENCOUNTER — Ambulatory Visit (HOSPITAL_BASED_OUTPATIENT_CLINIC_OR_DEPARTMENT_OTHER)
Admission: RE | Admit: 2021-02-01 | Discharge: 2021-02-01 | Disposition: A | Discharge status: Home / Self Care | Payer: 59 | Source: Ambulatory Visit | Attending: Medical | Admitting: Medical

## 2021-02-01 ENCOUNTER — Ambulatory Visit (INDEPENDENT_AMBULATORY_CARE_PROVIDER_SITE_OTHER): Payer: 59 | Admitting: Medical

## 2021-02-01 ENCOUNTER — Telehealth: Payer: Self-pay

## 2021-02-01 ENCOUNTER — Telehealth: Payer: Self-pay | Admitting: Medical

## 2021-02-01 ENCOUNTER — Ambulatory Visit: Payer: 59 | Admitting: Internal Medicine

## 2021-02-01 ENCOUNTER — Other Ambulatory Visit: Payer: Self-pay

## 2021-02-01 ENCOUNTER — Other Ambulatory Visit (HOSPITAL_BASED_OUTPATIENT_CLINIC_OR_DEPARTMENT_OTHER): Payer: Self-pay

## 2021-02-01 VITALS — BP 140/56 | HR 70 | Resp 20 | Ht 61.0 in | Wt 221.5 lb

## 2021-02-01 DIAGNOSIS — R0781 Pleurodynia: Secondary | ICD-10-CM | POA: Diagnosis present

## 2021-02-01 DIAGNOSIS — Z0289 Encounter for other administrative examinations: Secondary | ICD-10-CM

## 2021-02-01 DIAGNOSIS — M25562 Pain in left knee: Secondary | ICD-10-CM | POA: Insufficient documentation

## 2021-02-01 MED ORDER — HYDROCODONE-ACETAMINOPHEN 5-325 MG PO TABS
1.0000 | ORAL_TABLET | Freq: Four times a day (QID) | ORAL | 0 refills | Status: DC | PRN
  Filled 2021-02-01: qty 30, 7d supply, fill #0

## 2021-02-01 NOTE — Telephone Encounter (Signed)
Pt scheduled with Kathlene November, MD  Client [facility] Night - Client Client Site [facility] - Night Physician Debbrah Alar - NP Contact Type Call Who Is Calling Patient / Member / Family / Caregiver Call Type Triage / Clinical Relationship To Patient Self Return Phone Number [phone] (Primary) Chief Complaint Knee Injury Reason for Call Symptomatic / Request for [place] states she fell yesterday and hurt her knee. Translation No Nurse Assessment Nurse: Windle Guard, RN, Lesa Date/Time (Eastern Time): 02/01/2021 8:01:35 AM Confirm and document reason for call. If symptomatic, describe symptoms. ---Caller states she fell and hit her head, her right leg has a cut. Denies bleeding and she injured her left knee. Her left knee is brothering her the most and it is hard for her to walk Does the patient have any new or worsening symptoms? ---Yes Will a triage be completed? ---Yes Related visit to physician within the last 2 weeks? ---No Does the PT have any chronic conditions? (i.e. diabetes, asthma, this includes High risk factors for pregnancy, etc.) ---Yes List chronic conditions. ---hx of breast CA, HTN, mitral valve prolapse Is this a behavioral health or substance abuse call? ---No

## 2021-02-01 NOTE — Telephone Encounter (Signed)
Referral to sports med placed.

## 2021-02-01 NOTE — Patient Instructions (Addendum)
Severe left knee pain after a fall.  Will get x-ray of left knee today.  Advised that you use ibuprofen 200 to 400 mg every 8 hours as needed pain.  Also making Norco 5/325 mg tabs available to use 1 every 6 hours for severe pain.  After x-ray review will decide on referring to orthopedist versus sports medicine.  If x-rays are normal then might consider internal derangement based on your high level of pain.  Left rib pain after fall as well.  We will get left rib x-ray along with chest x-ray.  By exam in history/level of pain described I do not think x-ray of right forearm or hip is indicated.  Mild contusion on top of head.  Normal neurologic exam and describes minimal soreness.  Negative neurologic review of systems/ no loss of consciousness.  Do not think CT imaging of head indicated.  If your signs and symptoms change or worsen let us know.    Follow-up in 5 to 7 days or as needed.

## 2021-02-01 NOTE — Progress Notes (Signed)
Subjective:    Patient ID: Katie Jacobson, female    DOB: [birth year], 65 y.o.   MRN: [id number]  HPI  Pt in for some bodyaches after fall over the weekend.  Pt states fell in her house. Pt husband spilled water in her house/kitchen. She slipped. Hit cabinet and floor. Pt has rt forearm small bruise,  left knee, left rib, rt hip and top of mild head mild sore.  Small abrasion rt calf. Rt hip pain states very minimal able to use crutch with no pain. Rt forearm minimal sore pain.   No loc at time of fall. No gross motor or sensory function deficits. States bumped top of her head on counter top.  Rt knee is most painful area. She can't walk due to rt anterior knee pain. Left rib area also hurts. But no shortness of breath.    Review of Systems  Constitutional: Negative for chills, fatigue and fever.  Respiratory: Negative for cough, chest tightness, shortness of breath and wheezing.   Cardiovascular: Negative for chest pain and palpitations.  Gastrointestinal: Negative for abdominal pain.  Musculoskeletal: Negative for back pain, joint swelling and myalgias.       See hpi.  Skin: Negative for rash.  Neurological: Negative for dizziness, seizures, syncope, weakness and light-headedness.  Hematological: Negative for adenopathy. Does not bruise/bleed easily.  Psychiatric/Behavioral: Negative for behavioral problems, confusion, self-injury and suicidal ideas. The patient is not nervous/anxious.     Past Medical History:  Diagnosis Date  . Allergy   . Breast cancer of upper-outer quadrant of right female breast ([place]) 11/14/2014   Treated with chemotherapy and radiation  . History of chemotherapy    finished chemo 02/03/2015  . History of kidney stones   . History of seizures    as a child - unknown cause - states was never on anticonvulsants  . Hypertension    states under control with meds., has been on med. x "years"  . Mitral valve prolapse 1990   Follow-up echocardiogram  not show mitral prolapse.  Shows mild mitral vegetation.  . Osteoporosis   . Personal history of chemotherapy   . Personal history of radiation therapy   . Seasonal allergies   . Seizures ([place])    as a child-none as a adult     Social History   Socioeconomic History  . Marital status: Married    Spouse name: Not on file  . Number of children: 2  . Years of education: Not on file  . Highest education level: Not on file  Occupational History  . Not on file  Tobacco Use  . Smoking status: Current Every Day Smoker    Packs/day: 0.50    Years: 43.00    Pack years: 21.50    Types: Cigarettes  . Smokeless tobacco: Former Systems developer    Quit date: 09/09/2016  . Tobacco comment: 10 cig./day  Vaping Use  . Vaping Use: Never used  Substance and Sexual Activity  . Alcohol use: Yes    Alcohol/week: 5.0 standard drinks    Types: 5 Standard drinks or equivalent per week    Comment: occ   . Drug use: No  . Sexual activity: Not on file  Other Topics Concern  . 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  Has been doing  part time work with [employer] -- had 6 active clients, but the most important one is her Daughter.   Regular exercise:  No   Caffeine Use: none; Current 1ppd smoker x > 39 yrs; EtOH ~4 oz/week   Social Determinants of Health   Financial Resource Strain: Not on file  Food Insecurity: Not on file  Transportation Needs: Not on file  Physical Activity: Not on file  Stress: Not on file  Social Connections: Not on file  Intimate Partner Violence: Not on file    Past Surgical History:  Procedure Laterality Date  . ABDOMINAL HYSTERECTOMY  1998   partial  . APPENDECTOMY  1976  . BREAST LUMPECTOMY Right 02/2015  . COLONOSCOPY  09/30/2016  . KNEE ARTHROSCOPY Left 03/26/2008  . PLANTAR'S WART EXCISION Left    x 2  .  PORT-A-CATH REMOVAL Left 04/06/2015   Procedure: REMOVAL PORT-A-CATH;  Surgeon: Rolm Bookbinder, MD;  Location: [facility];  Service: General;  Laterality: Left;  . PORTACATH PLACEMENT N/A 11/27/2014   Procedure: INSERTION PORT-A-CATH;  Surgeon: Rolm Bookbinder, MD;  Location: [facility];  Service: General;  Laterality: N/A;  . RADIOACTIVE SEED GUIDED PARTIAL MASTECTOMY WITH AXILLARY SENTINEL LYMPH NODE BIOPSY Right 03/02/2015   Procedure: RADIOACTIVE SEED GUIDED RIGHT PARTIAL MASTECTOMY WITH RIGHT AXILLARY SENTINEL LYMPH NODE BIOPSY;  Surgeon: Rolm Bookbinder, MD;  Location: [facility];  Service: General;  Laterality: Right;  . SALIVARY STONE REMOVAL Right 1972  . TONSILLECTOMY  1970s  . TRANSTHORACIC ECHOCARDIOGRAM  01/16/2020   EF 60 to 65%.  GRII DD.  No R WMA.  Normal RV size and function.  Rheumatic mitral valve with moderate thickening-hockey-stick appearing.  Moderate MR with mild MS.  Moderate LA dilation.  (Recommend follow-up in 2 to 3 years)  . TUBAL LIGATION  1996    Family History  Problem Relation Age of Onset  . Heart disease Mother   . Stroke Mother   . Hypertension Mother   . Diabetes Mother   . Diabetes Sister   . Kidney disease Brother   . Mental retardation Brother   . Diabetes Sister   . Heart attack Father   . Breast cancer Cousin        deceased 65  . Colon polyps Daughter   . Colon cancer Neg Hx   . Esophageal cancer Neg Hx   . Rectal cancer Neg Hx   . Stomach cancer Neg Hx     Allergies  Allergen Reactions  . Sulfa Antibiotics Other (See Comments)    UNKNOWN    Current Outpatient Medications on File Prior to Visit  Medication Sig Dispense Refill  . albuterol (PROVENTIL HFA;VENTOLIN HFA) 108 (90 BASE) MCG/ACT inhaler Inhale into the lungs every 6 (six) hours as needed for wheezing or shortness of breath.    Marland Kitchen aspirin 81 MG tablet Take 1 tablet (81 mg total) by mouth daily. 30 tablet 0  . chlorthalidone  (HYGROTON) 25 MG tablet TAKE 1 TABLET(25 MG) BY MOUTH EVERY OTHER DAY 45 tablet 3  . cholecalciferol (VITAMIN D3) 25 MCG (1000 UT) tablet Take 1 tablet (1,000 Units total) by mouth daily.    Marland Kitchen COVID-19 mRNA vaccine, Pfizer, 30 MCG/0.3ML injection INJECT AS DIRECTED .3 mL 0  . enalapril (VASOTEC) 20 MG tablet Take 1 tablet (20 mg total) by mouth at bedtime. 90 tablet 3  . fluticasone (FLONASE) 50 MCG/ACT nasal spray Place 2 sprays into both nostrils as needed. 9.9 mL 5  .  metoprolol succinate (TOPROL-XL) 50 MG 24 hr tablet TAKE 1 TABLET BY MOUTH EVERY DAY 90 tablet 1  . metoprolol tartrate (LOPRESSOR) 25 MG tablet TAKE 1 TABLET BY MOUTH 2 TO 3 TIMES DAILY AS NEEDED FOR HEART FLUTTERING 30 tablet 6   No current facility-administered medications on file prior to visit.    BP (!) 140/56   Pulse 70   Resp 20   Ht 5\' 1"  (1.549 m)   Wt 221 lb 8 oz (100.5 kg)   LMP 10/10/1996   SpO2 98%   BMI 41.85 kg/m       Objective:   Physical Exam  General Mental Status- Alert. General Appearance- Not in acute distress.   Skin General: Color- Normal Color. Moisture- Normal Moisture.  Neck Carotid Arteries- Normal color. Moisture- Normal Moisture. No carotid bruits. No JVD.  Chest and Lung Exam Auscultation: Breath Sounds:-Normal.  Cardiovascular Auscultation:Rythm- Regular. Murmurs & Other Heart Sounds:Auscultation of the heart reveals- No Murmurs.  Abdomen Inspection:-Inspeection Normal. Palpation/Percussion:Note:No mass. Palpation and Percussion of the abdomen reveal- Non Tender, Non Distended + BS, no rebound or guarding.   Neurologic Cranial Nerve exam:- CN III-XII intact(No nystagmus), symmetric smile. Drift Test:- No drift. Romberg Exam:- Negative.  Heal to Toe Gait exam:-Normal. Finger to Nose:- Normal/Intact Strength:- 5/5 equal and symmetric strength both upper and lower extremities.  Left knee- pain on flexion and extension. Knee is swollen anterior asprect swelling.  Hurts a lot to mild touch.  Left lower ext- no calf swelling. Negative homan sign.  Rt calf- mild small linear abrasion. Scab formed. No redness, no warmth or tenderness.  Rt forearm- small bruise present. On palpation of deep forearm ulna and radius no pain. Good pronation and supination no pain.  Rt hip- no hip pain on palpation.      Assessment & Plan:  Severe left knee pain after a fall.  Will get x-ray of left knee today.  Advised that you use ibuprofen 200 to 400 mg every 8 hours as needed pain.  Also making Norco 5/325 mg tabs available to use 1 every 6 hours for severe pain.  After x-ray review will decide on referring to orthopedist versus sports medicine.  If x-rays are normal then might consider internal derangement based on your high level of pain.  Left rib pain after fall as well.  We will get left rib x-ray along with chest x-ray.  By exam in history/level of pain described I do not think x-ray of right forearm or hip is indicated.  Mild contusion on top of head.  Normal neurologic exam and describes minimal soreness.  Negative neurologic review of systems/ no loss of consciousness.  Do not think CT imaging of head indicated.  If your signs and symptoms change or worsen let us know.    Follow-up in 5 to 7 days or as needed.   Mackie Pai, PA-C   Time spent with patient today was 35  minutes which consisted of chart revdiew, discussing diagnosis, work up ,treatment and documentation.

## 2021-02-09 ENCOUNTER — Ambulatory Visit (INDEPENDENT_AMBULATORY_CARE_PROVIDER_SITE_OTHER): Payer: 59 | Admitting: Family Medicine

## 2021-02-09 ENCOUNTER — Ambulatory Visit: Payer: Self-pay

## 2021-02-09 ENCOUNTER — Ambulatory Visit (HOSPITAL_BASED_OUTPATIENT_CLINIC_OR_DEPARTMENT_OTHER)
Admission: RE | Admit: 2021-02-09 | Discharge: 2021-02-09 | Disposition: A | Discharge status: Home / Self Care | Payer: 59 | Source: Ambulatory Visit | Attending: Family Medicine | Admitting: Family Medicine

## 2021-02-09 ENCOUNTER — Other Ambulatory Visit: Payer: Self-pay

## 2021-02-09 VITALS — BP 140/70 | Ht 61.0 in | Wt 221.5 lb

## 2021-02-09 DIAGNOSIS — S83412A Sprain of medial collateral ligament of left knee, initial encounter: Secondary | ICD-10-CM | POA: Diagnosis not present

## 2021-02-09 DIAGNOSIS — S161XXA Strain of muscle, fascia and tendon at neck level, initial encounter: Secondary | ICD-10-CM | POA: Insufficient documentation

## 2021-02-09 DIAGNOSIS — M25562 Pain in left knee: Secondary | ICD-10-CM

## 2021-02-09 NOTE — Assessment & Plan Note (Signed)
Has changes on ultrasound to suggest sprain of the MCL.  Seems less likely to be associated with degenerative changes. -Counseled on home exercise therapy and supportive care. -Hinged knee brace. -Provided Duexis samples -Referral to physical therapy. -Could consider injection

## 2021-02-09 NOTE — Patient Instructions (Signed)
Nice to meet you Please use heat on the neck  Please try ice on the knee  Please use the duexis for three days straight and then as needed  Please use the norco as needed for severe pain.  Please try physical therapy   Please send me a message in MyChart with any questions or updates.  Please see me back in 4 weeks.   --Dr. Raeford Razor

## 2021-02-09 NOTE — Progress Notes (Signed)
Medication Samples have been provided to the patient.  Drug name: duexis       Strength: 800/26.6mg         Qty: 2bx  LOT: [id number]  Exp.Date: 03/09/21  Dosing instructions: take one three times a day  The patient has been instructed regarding the correct time, dose, and frequency of taking this medication, including desired effects and most common side effects.   Katie Jacobson 3:20 PM 02/09/2021

## 2021-02-09 NOTE — Assessment & Plan Note (Signed)
Pain ongoing after the fall a few days ago.  Seems most muscular in nature. -Counseled on home exercise therapy and supportive care. -X-ray. -Referral to physical therapy.

## 2021-02-09 NOTE — Progress Notes (Signed)
Katie Jacobson - 65 y.o. female MRN [id number]  Date of birth: [birth year]  SUBJECTIVE:  Including CC & ROS.  No chief complaint on file.   Katie Jacobson is a 65 y.o. female that is presenting with left knee pain and neck pain.  She had a fall a few days ago and has ongoing pain in these areas.  Symptoms occurring over the medial aspect of the left knee.  She is also having left-sided neck pain.  No improvement with medications to date..  Independent review of the left knee x-ray from 4/25 shows severe degenerative change of the lateral compartment.   Review of Systems See HPI   HISTORY: Past Medical, Surgical, Social, and Family History Reviewed & Updated per EMR.   Pertinent Historical Findings include:  Past Medical History:  Diagnosis Date  . Allergy   . Breast cancer of upper-outer quadrant of right female breast ([place]) 11/14/2014   Treated with chemotherapy and radiation  . History of chemotherapy    finished chemo 02/03/2015  . History of kidney stones   . History of seizures    as a child - unknown cause - states was never on anticonvulsants  . Hypertension    states under control with meds., has been on med. x "years"  . Mitral valve prolapse 1990   Follow-up echocardiogram not show mitral prolapse.  Shows mild mitral vegetation.  . Osteoporosis   . Personal history of chemotherapy   . Personal history of radiation therapy   . Seasonal allergies   . Seizures ([place])    as a child-none as a adult    Past Surgical History:  Procedure Laterality Date  . ABDOMINAL HYSTERECTOMY  1998   partial  . APPENDECTOMY  1976  . BREAST LUMPECTOMY Right 02/2015  . COLONOSCOPY  09/30/2016  . KNEE ARTHROSCOPY Left 03/26/2008  . PLANTAR'S WART EXCISION Left    x 2  . PORT-A-CATH REMOVAL Left 04/06/2015   Procedure: REMOVAL PORT-A-CATH;  Surgeon: Rolm Bookbinder, MD;  Location: [facility];  Service: General;  Laterality: Left;  . PORTACATH PLACEMENT N/A 11/27/2014    Procedure: INSERTION PORT-A-CATH;  Surgeon: Rolm Bookbinder, MD;  Location: [facility];  Service: General;  Laterality: N/A;  . RADIOACTIVE SEED GUIDED PARTIAL MASTECTOMY WITH AXILLARY SENTINEL LYMPH NODE BIOPSY Right 03/02/2015   Procedure: RADIOACTIVE SEED GUIDED RIGHT PARTIAL MASTECTOMY WITH RIGHT AXILLARY SENTINEL LYMPH NODE BIOPSY;  Surgeon: Rolm Bookbinder, MD;  Location: [facility];  Service: General;  Laterality: Right;  . SALIVARY STONE REMOVAL Right 1972  . TONSILLECTOMY  1970s  . TRANSTHORACIC ECHOCARDIOGRAM  01/16/2020   EF 60 to 65%.  GRII DD.  No R WMA.  Normal RV size and function.  Rheumatic mitral valve with moderate thickening-hockey-stick appearing.  Moderate MR with mild MS.  Moderate LA dilation.  (Recommend follow-up in 2 to 3 years)  . TUBAL LIGATION  1996    Family History  Problem Relation Age of Onset  . Heart disease Mother   . Stroke Mother   . Hypertension Mother   . Diabetes Mother   . Diabetes Sister   . Kidney disease Brother   . Mental retardation Brother   . Diabetes Sister   . Heart attack Father   . Breast cancer Cousin        deceased 30  . Colon polyps Daughter   . Colon cancer Neg Hx   . Esophageal cancer Neg Hx   . Rectal  cancer Neg Hx   . Stomach cancer Neg Hx     Social History   Socioeconomic History  . Marital status: Married    Spouse name: Not on file  . Number of children: 2  . Years of education: Not on file  . Highest education level: Not on file  Occupational History  . Not on file  Tobacco Use  . Smoking status: Current Every Day Smoker    Packs/day: 0.50    Years: 43.00    Pack years: 21.50    Types: Cigarettes  . Smokeless tobacco: Former Systems developer    Quit date: 09/09/2016  . Tobacco comment: 10 cig./day  Vaping Use  . Vaping Use: Never used  Substance and Sexual Activity  . Alcohol use: Yes    Alcohol/week: 5.0 standard drinks    Types: 5 Standard drinks or equivalent per week     Comment: occ   . Drug use: No  . Sexual activity: Not on file  Other Topics Concern  . 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  Has been doing part time work with [employer] -- had 6 active clients, but the most important one is her Daughter.   Regular exercise:  No   Caffeine Use: none; Current 1ppd smoker x > 39 yrs; EtOH ~4 oz/week   Social Determinants of Health   Financial Resource Strain: Not on file  Food Insecurity: Not on file  Transportation Needs: Not on file  Physical Activity: Not on file  Stress: Not on file  Social Connections: Not on file  Intimate Partner Violence: Not on file     PHYSICAL EXAM:  VS: BP 140/70   Ht 5\' 1"  (1.549 m)   Wt 221 lb 8 oz (100.5 kg)   LMP 10/10/1996   BMI 41.85 kg/m  Physical Exam Gen: NAD, alert, cooperative with exam, well-appearing MSK:  Left knee: Tenderness to palpation of the medial joint space. Normal range of motion. Normal strength resistance. Neurovascular intact  Limited ultrasound: Left knee:  Mild effusion. Normal-appearing quadricep and patellar tendon. Normal-appearing medial meniscus. Increased hyperemia over the medial compartment and at the origin of the MCL. Normal-appearing lateral joint space.  Summary: Findings consistent with MCL sprain  Ultrasound and interpretation by Clearance Coots, MD    ASSESSMENT & PLAN:   Cervical strain Pain ongoing after the fall a few days ago.  Seems most muscular in nature. -Counseled on home exercise therapy and supportive care. -X-ray. -Referral to physical therapy.  Sprain of medial collateral ligament of left knee Has changes on ultrasound to suggest sprain of the MCL.  Seems less likely to be associated with degenerative changes. -Counseled on home exercise therapy and supportive care. -Hinged knee  brace. -Provided Duexis samples -Referral to physical therapy. -Could consider injection

## 2021-02-12 ENCOUNTER — Telehealth: Payer: Self-pay | Admitting: Family Medicine

## 2021-02-12 ENCOUNTER — Ambulatory Visit: Payer: 59 | Admitting: Family

## 2021-02-12 NOTE — Telephone Encounter (Signed)
Informed of results. Will switch hinged knee brace.   Rosemarie Ax, MD [facility] 02/12/2021, 8:15 AM

## 2021-02-24 ENCOUNTER — Other Ambulatory Visit: Payer: Self-pay | Admitting: *Deleted

## 2021-02-24 DIAGNOSIS — S161XXA Strain of muscle, fascia and tendon at neck level, initial encounter: Secondary | ICD-10-CM

## 2021-02-24 DIAGNOSIS — S83412A Sprain of medial collateral ligament of left knee, initial encounter: Secondary | ICD-10-CM

## 2021-03-02 ENCOUNTER — Other Ambulatory Visit: Payer: Self-pay | Admitting: Cardiology

## 2021-03-09 ENCOUNTER — Other Ambulatory Visit: Payer: Self-pay

## 2021-03-09 ENCOUNTER — Ambulatory Visit (INDEPENDENT_AMBULATORY_CARE_PROVIDER_SITE_OTHER): Payer: 59 | Admitting: Family Medicine

## 2021-03-09 ENCOUNTER — Encounter: Payer: Self-pay | Admitting: Family Medicine

## 2021-03-09 VITALS — BP 120/74 | Ht 61.0 in | Wt 221.0 lb

## 2021-03-09 DIAGNOSIS — S83412D Sprain of medial collateral ligament of left knee, subsequent encounter: Secondary | ICD-10-CM

## 2021-03-09 DIAGNOSIS — M1712 Unilateral primary osteoarthritis, left knee: Secondary | ICD-10-CM

## 2021-03-09 NOTE — Progress Notes (Signed)
Katie Jacobson - 65 y.o. female MRN [id number]  Date of birth: Jun 15, 1956  SUBJECTIVE:  Including CC & ROS.  No chief complaint on file.   Katie Jacobson is a 65 y.o. female that is following up for her knee pain.  She has gotten improvement where she is able to walk but still has intermittent pain.   Review of Systems See HPI   HISTORY: Past Medical, Surgical, Social, and Family History Reviewed & Updated per EMR.   Pertinent Historical Findings include:  Past Medical History:  Diagnosis Date  . Allergy   . Breast cancer of upper-outer quadrant of right female breast ([place]) 11/14/2014   Treated with chemotherapy and radiation  . History of chemotherapy    finished chemo 02/03/2015  . History of kidney stones   . History of seizures    as a child - unknown cause - states was never on anticonvulsants  . Hypertension    states under control with meds., has been on med. x "years"  . Mitral valve prolapse 1990   Follow-up echocardiogram not show mitral prolapse.  Shows mild mitral vegetation.  . Osteoporosis   . Personal history of chemotherapy   . Personal history of radiation therapy   . Seasonal allergies   . Seizures ([place])    as a child-none as a adult    Past Surgical History:  Procedure Laterality Date  . ABDOMINAL HYSTERECTOMY  1998   partial  . APPENDECTOMY  1976  . BREAST LUMPECTOMY Right 02/2015  . COLONOSCOPY  09/30/2016  . KNEE ARTHROSCOPY Left 03/26/2008  . PLANTAR'S WART EXCISION Left    x 2  . PORT-A-CATH REMOVAL Left 04/06/2015   Procedure: REMOVAL PORT-A-CATH;  Surgeon: Rolm Bookbinder, MD;  Location: [facility];  Service: General;  Laterality: Left;  . PORTACATH PLACEMENT N/A 11/27/2014   Procedure: INSERTION PORT-A-CATH;  Surgeon: Rolm Bookbinder, MD;  Location: [facility];  Service: General;  Laterality: N/A;  . RADIOACTIVE SEED GUIDED PARTIAL MASTECTOMY WITH AXILLARY SENTINEL LYMPH NODE BIOPSY Right 03/02/2015    Procedure: RADIOACTIVE SEED GUIDED RIGHT PARTIAL MASTECTOMY WITH RIGHT AXILLARY SENTINEL LYMPH NODE BIOPSY;  Surgeon: Rolm Bookbinder, MD;  Location: [facility];  Service: General;  Laterality: Right;  . SALIVARY STONE REMOVAL Right 1972  . TONSILLECTOMY  1970s  . TRANSTHORACIC ECHOCARDIOGRAM  01/16/2020   EF 60 to 65%.  GRII DD.  No R WMA.  Normal RV size and function.  Rheumatic mitral valve with moderate thickening-hockey-stick appearing.  Moderate MR with mild MS.  Moderate LA dilation.  (Recommend follow-up in 2 to 3 years)  . TUBAL LIGATION  1996    Family History  Problem Relation Age of Onset  . Heart disease Mother   . Stroke Mother   . Hypertension Mother   . Diabetes Mother   . Diabetes Sister   . Kidney disease Brother   . Mental retardation Brother   . Diabetes Sister   . Heart attack Father   . Breast cancer Cousin        deceased 34  . Colon polyps Daughter   . Colon cancer Neg Hx   . Esophageal cancer Neg Hx   . Rectal cancer Neg Hx   . Stomach cancer Neg Hx     Social History   Socioeconomic History  . Marital status: Married    Spouse name: Not on file  . Number of children: 2  . Years of education: Not on  file  . Highest education level: Not on file  Occupational History  . Not on file  Tobacco Use  . Smoking status: Current Every Day Smoker    Packs/day: 0.50    Years: 43.00    Pack years: 21.50    Types: Cigarettes  . Smokeless tobacco: Former Systems developer    Quit date: 09/09/2016  . Tobacco comment: 10 cig./day  Vaping Use  . Vaping Use: Never used  Substance and Sexual Activity  . Alcohol use: Yes    Alcohol/week: 5.0 standard drinks    Types: 5 Standard drinks or equivalent per week    Comment: occ   . Drug use: No  . Sexual activity: Not on file  Other Topics Concern  . 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  Has been doing part time work with [employer] -- had 6 active clients, but the most important one is her Daughter.   Regular exercise:  No   Caffeine Use: none; Current 1ppd smoker x > 39 yrs; EtOH ~4 oz/week   Social Determinants of Health   Financial Resource Strain: Not on file  Food Insecurity: Not on file  Transportation Needs: Not on file  Physical Activity: Not on file  Stress: Not on file  Social Connections: Not on file  Intimate Partner Violence: Not on file     PHYSICAL EXAM:  VS: BP 120/74 (BP Location: Left Arm, Patient Position: Sitting, Cuff Size: Large)   Ht 5\' 1"  (1.549 m)   Wt 221 lb (100.2 kg)   LMP 10/10/1996   BMI 41.76 kg/m  Physical Exam Gen: NAD, alert, cooperative with exam, well-appearing     ASSESSMENT & PLAN:   Sprain of medial collateral ligament of left knee Still having intermittent pain from time to time.  Is able to get around a little bit better -Counseled on home exercise therapy and supportive care. - pennsaid samples  - could consider nitro  Primary osteoarthritis of left knee Likely have a component of degenerative changes contributing to ongoing pain. -Counseled on home exercise therapy and supportive care. -Pennsaid samples. -Consider injection.

## 2021-03-09 NOTE — Patient Instructions (Signed)
Good to see you Please try ice  Please try the pennsaid  Please try the exercises  Please send me a message in MyChart with any questions or updates.  Please see me back in 4 weeks or as needed if better.   --Dr. Raeford Razor

## 2021-03-09 NOTE — Assessment & Plan Note (Signed)
Still having intermittent pain from time to time.  Is able to get around a little bit better -Counseled on home exercise therapy and supportive care. - pennsaid samples  - could consider nitro

## 2021-03-09 NOTE — Progress Notes (Signed)
Medication Samples have been provided to the patient.  Drug name: Pennsaid       Strength: 2%        Qty: 2 boxes LOT: [id number] Exp.Date: 04/2022  Dosing instructions: use a pea sized amount on affected area.   The patient has been instructed regarding the correct time, dose, and frequency of taking this medication, including desired effects and most common side effects.   Jonathon Resides 3:12 PM 03/09/2021

## 2021-03-09 NOTE — Assessment & Plan Note (Signed)
Likely have a component of degenerative changes contributing to ongoing pain. -Counseled on home exercise therapy and supportive care. -Pennsaid samples. -Consider injection.

## 2021-03-14 ENCOUNTER — Other Ambulatory Visit: Payer: Self-pay | Admitting: Cardiology

## 2021-04-20 ENCOUNTER — Other Ambulatory Visit (HOSPITAL_BASED_OUTPATIENT_CLINIC_OR_DEPARTMENT_OTHER): Payer: Self-pay

## 2021-04-20 ENCOUNTER — Ambulatory Visit (INDEPENDENT_AMBULATORY_CARE_PROVIDER_SITE_OTHER): Payer: 59 | Admitting: Family

## 2021-04-20 ENCOUNTER — Other Ambulatory Visit: Payer: Self-pay

## 2021-04-20 VITALS — BP 148/70 | HR 77 | Temp 98.6°F | Resp 20 | Ht 61.0 in | Wt 223.4 lb

## 2021-04-20 DIAGNOSIS — M5412 Radiculopathy, cervical region: Secondary | ICD-10-CM | POA: Insufficient documentation

## 2021-04-20 MED ORDER — METHYLPREDNISOLONE 4 MG PO TBPK
ORAL_TABLET | ORAL | 0 refills | Status: DC
  Filled 2021-04-20: qty 21, 6d supply, fill #0

## 2021-04-20 MED ORDER — METHOCARBAMOL 500 MG PO TABS
500.0000 mg | ORAL_TABLET | Freq: Three times a day (TID) | ORAL | 0 refills | Status: DC | PRN
  Filled 2021-04-20: qty 20, 7d supply, fill #0

## 2021-04-20 MED ORDER — TRAMADOL HCL 50 MG PO TABS
50.0000 mg | ORAL_TABLET | Freq: Three times a day (TID) | ORAL | 0 refills | Status: AC | PRN
  Filled 2021-04-20: qty 15, 5d supply, fill #0

## 2021-04-20 NOTE — Patient Instructions (Signed)
Start Medrol dose Pak (steroid) You may use robaxin (muscle relaxer) every 8 hrs as needed for muscle spasm and tramadol every 8 hrs as needed for severe pain. You may use tylenol for mild pain.

## 2021-04-20 NOTE — Assessment & Plan Note (Signed)
New.  Advised pt as follows:   Start Medrol dose Pak (steroid) You may use robaxin (muscle relaxer) every 8 hrs as needed for muscle spasm and tramadol every 8 hrs as needed for severe pain. You may use tylenol for mild pain.

## 2021-04-20 NOTE — Progress Notes (Signed)
Subjective:   By signing my name below, I, Shehryar Baig, attest that this documentation has been prepared under the direction and in the presence of Debbrah Alar NP. 04/20/2021     Patient ID: Katie Jacobson, female    DOB: 17-Nov-1955, 65 y.o.   MRN: [id number]  Chief Complaint  Patient presents with   Shoulder Pain    Left, x1 week, Pt states fall x1 month, doesn't know if it is related.     Shoulder Pain   Patient is in today for a office visit. She complains of pain her left shoulder for the past week. She also recently developed neck pain. She cannot lift her left arm due to pain. She has been using ibuprofen without improvment.  Pain starts in the left side of her neck, radiates down the left arm and into her hand. Pain is very uncomfortable to the point she can't stand still.   She reports having a fall last month. She had X-rays taken and managed her issues but did not have shoulder pain during that time. She was told she had bursitis in the right shoulder when getting treated for lymphedema.  Health Maintenance Due  Topic Date Due   HIV Screening  Never done   Hepatitis C Screening  Never done   Pneumococcal Vaccine 18-22 Years old (2 - PCV) 07/06/2013   COVID-19 Vaccine (4 - Booster for Pfizer series) 12/10/2020    Past Medical History:  Diagnosis Date   Allergy    Breast cancer of upper-outer quadrant of right female breast ([place]) 11/14/2014   Treated with chemotherapy and radiation   History of chemotherapy    finished chemo 02/03/2015   History of kidney stones    History of seizures    as a child - unknown cause - states was never on anticonvulsants   Hypertension    states under control with meds., has been on med. x "years"   Mitral valve prolapse 1990   Follow-up echocardiogram not show mitral prolapse.  Shows mild mitral vegetation.   Osteoporosis    Personal history of chemotherapy    Personal history of radiation therapy    Seasonal allergies     Seizures ([place])    as a child-none as a adult    Past Surgical History:  Procedure Laterality Date   ABDOMINAL HYSTERECTOMY  1998   partial   APPENDECTOMY  1976   BREAST LUMPECTOMY Right 02/2015   COLONOSCOPY  09/30/2016   KNEE ARTHROSCOPY Left 03/26/2008   PLANTAR'S WART EXCISION Left    x 2   PORT-A-CATH REMOVAL Left 04/06/2015   Procedure: REMOVAL PORT-A-CATH;  Surgeon: Rolm Bookbinder, MD;  Location: [facility];  Service: General;  Laterality: Left;   PORTACATH PLACEMENT N/A 11/27/2014   Procedure: INSERTION PORT-A-CATH;  Surgeon: Rolm Bookbinder, MD;  Location: [facility];  Service: General;  Laterality: N/A;   RADIOACTIVE SEED GUIDED PARTIAL MASTECTOMY WITH AXILLARY SENTINEL LYMPH NODE BIOPSY Right 03/02/2015   Procedure: RADIOACTIVE SEED GUIDED RIGHT PARTIAL MASTECTOMY WITH RIGHT AXILLARY SENTINEL LYMPH NODE BIOPSY;  Surgeon: Rolm Bookbinder, MD;  Location: [facility];  Service: General;  Laterality: Right;   SALIVARY STONE REMOVAL Right 1972   TONSILLECTOMY  1970s   TRANSTHORACIC ECHOCARDIOGRAM  01/16/2020   EF 60 to 65%.  GRII DD.  No R WMA.  Normal RV size and function.  Rheumatic mitral valve with moderate thickening-hockey-stick appearing.  Moderate MR with mild MS.  Moderate LA  dilation.  (Recommend follow-up in 2 to 3 years)   TUBAL LIGATION  1996    Family History  Problem Relation Age of Onset   Heart disease Mother    Stroke Mother    Hypertension Mother    Diabetes Mother    Diabetes Sister    Kidney disease Brother    Mental retardation Brother    Diabetes Sister    Heart attack Father    Breast cancer Cousin        deceased 43   Colon polyps Daughter    Colon cancer Neg Hx    Esophageal cancer Neg Hx    Rectal cancer Neg Hx    Stomach cancer Neg Hx     Social History   Socioeconomic History   Marital status: Married    Spouse name: Not on file   Number of children: 2   Years of education: Not on  file   Highest education level: Not on file  Occupational History   Not on file  Tobacco Use   Smoking status: Every Day    Packs/day: 0.50    Years: 43.00    Pack years: 21.50    Types: Cigarettes   Smokeless tobacco: Former    Quit date: 09/09/2016   Tobacco comments:    10 cig./day  Vaping Use   Vaping Use: Never used  Substance and Sexual Activity   Alcohol use: Yes    Alcohol/week: 5.0 standard drinks    Types: 5 Standard drinks or equivalent per week    Comment: occ    Drug use: No   Sexual activity: Not on file  Other Topics Concern   Not on file  Social History Narrative   Married with 2 daughters- born 51 and 67 ([name] died in [place] 22-Jan-2016)   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  Has been doing part time work with [employer] -- had 6 active clients, but the most important one is her Daughter.   Regular exercise:  No   Caffeine Use: none; Current 1ppd smoker x > 39 yrs; EtOH ~4 oz/week   Social Determinants of Health   Financial Resource Strain: Not on file  Food Insecurity: Not on file  Transportation Needs: Not on file  Physical Activity: Not on file  Stress: Not on file  Social Connections: Not on file  Intimate Partner Violence: Not on file    Outpatient Medications Prior to Visit  Medication Sig Dispense Refill   albuterol (PROVENTIL HFA;VENTOLIN HFA) 108 (90 BASE) MCG/ACT inhaler Inhale into the lungs every 6 (six) hours as needed for wheezing or shortness of breath.     aspirin 81 MG tablet Take 1 tablet (81 mg total) by mouth daily. 30 tablet 0   chlorthalidone (HYGROTON) 25 MG tablet TAKE 1 TABLET(25 MG) BY MOUTH EVERY OTHER DAY 45 tablet 3   cholecalciferol (VITAMIN D3) 25 MCG (1000 UT) tablet Take 1 tablet (1,000 Units total) by mouth daily.     COVID-19 mRNA vaccine, Pfizer, 30 MCG/0.3ML injection INJECT AS DIRECTED .3 mL 0   enalapril (VASOTEC) 20 MG tablet TAKE 1 TABLET(20 MG) BY MOUTH AT BEDTIME  90 tablet 0   fluticasone (FLONASE) 50 MCG/ACT nasal spray Place 2 sprays into both nostrils as needed. 9.9 mL 5   metoprolol succinate (TOPROL-XL) 50 MG 24 hr tablet TAKE 1 TABLET BY MOUTH EVERY DAY 90 tablet 1   metoprolol tartrate (LOPRESSOR) 25 MG  tablet TAKE 1 TABLET BY MOUTH 2 TO 3 TIMES DAILY AS NEEDED FOR HEART FLUTTERING 30 tablet 6   HYDROcodone-acetaminophen (NORCO) 5-325 MG tablet Take 1 tablet by mouth every 6 (six) hours as needed for moderate pain. 30 tablet 0   No facility-administered medications prior to visit.    Allergies  Allergen Reactions   Sulfa Antibiotics Other (See Comments)    UNKNOWN    Review of Systems  Musculoskeletal:  Positive for joint pain (Left shoulder) and neck pain.      Objective:    Physical Exam Constitutional:      General: She is not in acute distress.    Appearance: Normal appearance. She is not ill-appearing.  HENT:     Head: Normocephalic and atraumatic.     Right Ear: External ear normal.     Left Ear: External ear normal.  Eyes:     Extraocular Movements: Extraocular movements intact.     Pupils: Pupils are equal, round, and reactive to light.  Cardiovascular:     Rate and Rhythm: Normal rate and regular rhythm.     Pulses: Normal pulses.     Heart sounds: Normal heart sounds. No murmur heard.   No gallop.  Pulmonary:     Effort: Pulmonary effort is normal. No respiratory distress.     Breath sounds: Normal breath sounds. No wheezing, rhonchi or rales.  Musculoskeletal:     Comments: Unable to assess left upper extremity strength due to patient discomfort.  Skin:    General: Skin is warm and dry.  Neurological:     Mental Status: She is alert and oriented to person, place, and time.  Psychiatric:        Behavior: Behavior normal.    BP (!) 148/70 (BP Location: Left Arm, Patient Position: Sitting, Cuff Size: Normal)   Pulse 77   Temp 98.6 F (37 C) (Oral)   Resp 20   Ht 5\' 1"  (1.549 m)   Wt 223 lb 6.4 oz (101.3  kg)   LMP 10/10/1996   SpO2 96%   BMI 42.21 kg/m  Wt Readings from Last 3 Encounters:  04/20/21 223 lb 6.4 oz (101.3 kg)  03/09/21 221 lb (100.2 kg)  02/09/21 221 lb 8 oz (100.5 kg)       Assessment & Plan:   Problem List Items Addressed This Visit       Unprioritized   Cervical radiculopathy - Primary    New.  Advised pt as follows:   Start Medrol dose Pak (steroid) You may use robaxin (muscle relaxer) every 8 hrs as needed for muscle spasm and tramadol every 8 hrs as needed for severe pain. You may use tylenol for mild pain.       Relevant Medications   methocarbamol (ROBAXIN) 500 MG tablet     Meds ordered this encounter  Medications   methylPREDNISolone (MEDROL DOSEPAK) 4 MG TBPK tablet    Sig: Take 6 tablets by mouth on day 1, then 5 tablets on day 2, then 4 tablets on day 3, then 3 tablets on day 4, then 2 tablets on day 5, then 1 tablet on day 6    Dispense:  21 tablet    Refill:  0    Order Specific Question:   Supervising Provider    Answer:   Penni Homans A [[id number]]   methocarbamol (ROBAXIN) 500 MG tablet    Sig: Take 1 tablet (500 mg total) by mouth every 8 (eight) hours as needed for  muscle spasms.    Dispense:  20 tablet    Refill:  0    Order Specific Question:   Supervising Provider    Answer:   Penni Homans A [[id number]]   traMADol (ULTRAM) 50 MG tablet    Sig: Take 1 tablet (50 mg total) by mouth every 8 (eight) hours as needed for up to 5 days.    Dispense:  15 tablet    Refill:  0    Order Specific Question:   Supervising Provider    Answer:   Penni Homans A [[id number]]    I, Debbrah Alar NP, personally preformed the services described in this documentation.  All medical record entries made by the scribe were at my direction and in my presence.  I have reviewed the chart and discharge instructions (if applicable) and agree that the record reflects my personal performance and is accurate and complete. 04/20/2021   I,Shehryar Baig,acting as a  Education administrator for Nance Pear, NP.,have documented all relevant documentation on the behalf of Nance Pear, NP,as directed by  Nance Pear, NP while in the presence of Nance Pear, NP.   Nance Pear, NP

## 2021-05-21 ENCOUNTER — Ambulatory Visit: Payer: 59 | Attending: Internal Medicine

## 2021-05-21 DIAGNOSIS — Z23 Encounter for immunization: Secondary | ICD-10-CM

## 2021-05-28 ENCOUNTER — Telehealth: Payer: Self-pay | Admitting: Family Medicine

## 2021-05-28 DIAGNOSIS — U071 COVID-19: Secondary | ICD-10-CM

## 2021-05-28 MED ORDER — MOLNUPIRAVIR EUA 200MG CAPSULE: 4.0000 | ORAL_CAPSULE | Freq: Two times a day (BID) | ORAL | 0 refills | Status: AC

## 2021-05-28 NOTE — Telephone Encounter (Signed)
Received a call from on call service at 11:15pm that pt just tested positive for coivd and is 'high risk" so necessary for provider to be called   Exposed Wednesday. Sx started today -Friday- and tested positive today. Called pt. She notes a mild cough, runny nose and occasional sneezing She is fully vaccinated Would like to use antiviral Called in molnupiravir Supportive measures and OTC meds as needed Seek care if not doing ok- any distress, etc

## 2021-05-31 ENCOUNTER — Other Ambulatory Visit (HOSPITAL_BASED_OUTPATIENT_CLINIC_OR_DEPARTMENT_OTHER): Payer: Self-pay

## 2021-05-31 MED ORDER — COVID-19 MRNA VAC-TRIS(PFIZER) 30 MCG/0.3ML IM SUSP
INTRAMUSCULAR | 0 refills | Status: DC
  Filled 2021-05-31: qty 0.3, 1d supply, fill #0

## 2021-06-24 ENCOUNTER — Other Ambulatory Visit: Payer: Self-pay | Admitting: Cardiology

## 2021-06-25 ENCOUNTER — Other Ambulatory Visit: Payer: Self-pay | Admitting: Family

## 2021-06-25 DIAGNOSIS — Z853 Personal history of malignant neoplasm of breast: Secondary | ICD-10-CM

## 2021-07-02 ENCOUNTER — Other Ambulatory Visit (HOSPITAL_BASED_OUTPATIENT_CLINIC_OR_DEPARTMENT_OTHER): Payer: Self-pay

## 2021-07-02 MED ORDER — INFLUENZA VAC SPLIT QUAD 0.5 ML IM SUSY
PREFILLED_SYRINGE | INTRAMUSCULAR | 0 refills | Status: DC
  Filled 2021-07-02: qty 0.5, 1d supply, fill #0

## 2021-08-04 ENCOUNTER — Ambulatory Visit: Payer: 59

## 2021-08-04 ENCOUNTER — Ambulatory Visit (INDEPENDENT_AMBULATORY_CARE_PROVIDER_SITE_OTHER): Payer: 59 | Admitting: Cardiology

## 2021-08-04 ENCOUNTER — Encounter: Payer: Self-pay | Admitting: Cardiology

## 2021-08-04 ENCOUNTER — Other Ambulatory Visit: Payer: Self-pay

## 2021-08-04 ENCOUNTER — Other Ambulatory Visit: Payer: Self-pay | Admitting: Family

## 2021-08-04 VITALS — BP 130/70 | HR 66 | Ht 61.0 in | Wt 223.8 lb

## 2021-08-04 DIAGNOSIS — R002 Palpitations: Secondary | ICD-10-CM

## 2021-08-04 DIAGNOSIS — I340 Nonrheumatic mitral (valve) insufficiency: Secondary | ICD-10-CM | POA: Diagnosis not present

## 2021-08-04 DIAGNOSIS — E785 Hyperlipidemia, unspecified: Secondary | ICD-10-CM

## 2021-08-04 DIAGNOSIS — E669 Obesity, unspecified: Secondary | ICD-10-CM

## 2021-08-04 DIAGNOSIS — Z1231 Encounter for screening mammogram for malignant neoplasm of breast: Secondary | ICD-10-CM

## 2021-08-04 DIAGNOSIS — I10 Essential (primary) hypertension: Secondary | ICD-10-CM

## 2021-08-04 DIAGNOSIS — Z720 Tobacco use: Secondary | ICD-10-CM

## 2021-08-04 DIAGNOSIS — I341 Nonrheumatic mitral (valve) prolapse: Secondary | ICD-10-CM | POA: Diagnosis not present

## 2021-08-04 NOTE — Patient Instructions (Addendum)
Medication Instructions:   No changes--- take your  as needed medication Metoprolol tartrate  -- palpations  *If you need a refill on your cardiac medications before your next appointment, please call your pharmacy*   Lab Work:  Not needed If you have labs (blood work) drawn today and your tests are completely normal, you will receive your results only by: [place] (if you have MyChart) OR A paper copy in the mail If you have any lab test that is abnormal or we need to change your treatment, we will call you to review the results.   Testing/Procedure: Will be schedule at [facility] [address] in Aug. or Sept 2023 Your physician has requested that you have an echocardiogram. Echocardiography is a painless test that uses sound waves to create images of your heart. It provides your doctor with information about the size and shape of your heart and how well your heart's chambers and valves are working. This procedure takes approximately one hour. There are no restrictions for this procedure.   Follow-Up: At [facility], you and your health needs are our priority.  As part of our continuing mission to provide you with exceptional heart care, we have created designated Provider Care Teams.  These Care Teams include your primary Cardiologist (physician) and Advanced Practice Providers (APPs -  Physician Assistants and Nurse Practitioners) who all work together to provide you with the care you need, when you need it.  We recommend signing up for the patient portal called "MyChart".  Sign up information is provided on this After Visit Summary.  MyChart is used to connect with patients for Virtual Visits (Telemedicine).  Patients are able to view lab/test results, encounter notes, upcoming appointments, etc.  Non-urgent messages can be sent to your provider as well.   To learn more about what you can do with MyChart, go to [email or web address].    Your next appointment:   12  month(s)  The format for your next appointment:   In Person  Provider:   Glenetta Hew, MD

## 2021-08-04 NOTE — Progress Notes (Signed)
Primary Care Provider: Debbrah Alar, NP Cardiologist: Glenetta Hew, MD Electrophysiologist: None  Clinic Note: Chief Complaint  Patient presents with   Follow-up    Annual follow-up.  Biggest issue is recovering from recent COVID infection.  Chronic fatigue   Cardiac Valve Problem    Mitral prolapse with MR.  Not noticing any difference in him exertional dyspnea.  No PND or orthopnea.    ===================================  ASSESSMENT/PLAN   Problem List Items Addressed This Visit       Cardiology Problems   Mitral valve prolapse (Chronic)   Relevant Orders   ECHOCARDIOGRAM COMPLETE   Hyperlipidemia LDL goal <100 (Chronic)    Unfortunately, I do not have any recent labs to review except for labs dated back from October 2020.  She did not get any labs done last year.  She did get labs with her PCP this year.  Would like to see LDL less than 100.  Not currently on any medications.      Essential hypertension (Chronic)    Blood pressure looks pretty good here she is on Toprol, along with enalapril and chlorthalidone.  She does have PRN blood pressure which she can use for tachycardic spells.  She just has not been using it.      Mitral regurgitation due to cusp prolapse (Chronic)    Moderate MR with some mitral prolapse on previous echocardiogram.  Will be due to follow-up every 2 years which would mean echocardiogram in September of next year.  She does have a soft murmur heard on exam, but not dramatic.  Relatively asymptomatic.  Goal will be to maintain stable blood pressures.  She is having palpitations and she is on a stable dose of beta-blocker.  On ACE inhibitor for afterload reduction and chlorthalidone for blood pressure control/diuretic.        Other   Intermittent palpitations - Primary (Chronic)    Palpitations are pretty well controlled STEMI dose of Toprol 50 mg daily.  She does have occasional breakthrough spells and I told her that she can use  the as needed Lopressor if the spells should happen.  This can also be used for accelerated hypertension spells.      Relevant Orders   EKG 12-Lead (Completed)   ECHOCARDIOGRAM COMPLETE   Tobacco abuse (Chronic)    Still jokes about quitting smoking.  Has not yet taken it seriously.  She was able to quit back in 2017 but wonder back to her again.  Smoking cessation instruction/counseling given:  counseled patient on the dangers of tobacco use, advised patient to stop smoking, and reviewed strategies to maximize success  4-5 min.      Obesity (BMI 35.0-39.9 without comorbidity) (Chronic)    The patient understands the need to lose weight with diet and exercise. We have discussed specific strategies for this.      Other Visit Diagnoses     Nonrheumatic mitral valve regurgitation       Relevant Orders   ECHOCARDIOGRAM COMPLETE       ===================================  HPI:    Katie Jacobson is a 65 y.o. female with a PMH notable for BREAST CANCER  (s/p Lumpectomy + Chemo/XRT  Sept 2016), HLD, Palpitations as well as Moderate MR/MS who below who presents today for annual follow-up.  Katie Jacobson was last seen on 06/30/2020 -she is doing fairly well overall.  She had a really bad bout of allergies the month before that visit.  Was coughing all night long.  She had a prolonged bout of tachycardia the day after-coming and going.  Some irregularity but mostly pounding beats.  She did not go to the ER because of concerns about COVID.  Other than that 1 day, really did not have the other episodes of significant palpitations.  She was able to recall a time when she had been very sick and full pain and fevers as a child likely consistent with rheumatic fever that goes along with a rheumatic heart disease seen on echo.  Was trying to lose weight.  BP was stable at home, but was elevated during the visit because of her running late and having to rush in.  Recent Hospitalizations:  No  hospitalization, but was diagnosed with COVID and August.  Reviewed  CV studies:    The following studies were reviewed today: (if available, images/films reviewed: From Epic Chart or Care Everywhere) None:   Interval History:   Katie Jacobson returns today for annual follow-up doing pretty well from a cardiac standpoint.  Biggest issue she is having is recovering from [place].  She is just extremely fatigued.  She is not sleepingAnd has memory fog.  She feels like she is waking up in the night for no reason, and then cannot get back to sleep.  She has palpitations a lot of times when she lays down at night this may be what is waking her up, but not prolonged and nothing that she can tell that is anything more than some forceful heartbeats.  She denies any prolonged irregular heartbeats or palpitations.  No lightheadedness, dizziness or wooziness, syncope/near syncope or TIA/amaurosis fugax. She is obese and quite deconditioned and therefore does have some exertional dyspnea but not anything new.  Just that with her fatigue she notices it more.  CV Review of Symptoms (Summary) Cardiovascular ROS: positive for - dyspnea on exertion, irregular heartbeat, palpitations, and Fatigue,Occasional lightheadedness but no syncope negative for - chest pain, edema, orthopnea, paroxysmal nocturnal dyspnea, rapid heart rate, shortness of breath, or Syncope/near syncope or TIA/amaurosis fugax, claudication  REVIEWED OF SYSTEMS   Review of Systems  Constitutional:  Positive for malaise/fatigue (Has been worn out ever since COVID.  She has been fatigued, mostly not sleeping very well.  She is waking up for coughing spells and congestion.). Negative for weight loss.  HENT:  Positive for congestion. Negative for nosebleeds.   Respiratory:  Positive for cough. Negative for sputum production, shortness of breath and wheezing.   Cardiovascular:  Negative for palpitations (Well-controlled).  Gastrointestinal:   Negative for blood in stool and melena.  Genitourinary:  Negative for dysuria and hematuria.  Musculoskeletal:  Positive for joint pain (She sprained her knee earlier this year.) and myalgias.       Still achy from her COVID back in the August.  Neurological:  Negative for dizziness and focal weakness.  Endo/Heme/Allergies:  Positive for environmental allergies.  Psychiatric/Behavioral:  Negative for depression, memory loss and suicidal ideas. The patient has insomnia (not sleeping well -).    I have reviewed and (if needed) personally updated the patient's problem list, medications, allergies, past medical and surgical history, social and family history.   PAST MEDICAL HISTORY   Past Medical History:  Diagnosis Date   Allergy    Breast cancer of upper-outer quadrant of right female breast ([place]) 11/14/2014   Treated with chemotherapy and radiation   History of chemotherapy    finished chemo 02/03/2015   History of kidney stones    History of  seizures    as a child - unknown cause - states was never on anticonvulsants   Hypertension    states under control with meds., has been on med. x "years"   Mitral valve prolapse 1990   Follow-up echocardiogram not show mitral prolapse.  Shows mild mitral vegetation.   Osteoporosis    Personal history of chemotherapy    Personal history of radiation therapy    Seasonal allergies    Seizures ([place])    as a child-none as a adult    PAST SURGICAL HISTORY   Past Surgical History:  Procedure Laterality Date   ABDOMINAL HYSTERECTOMY  1998   partial   APPENDECTOMY  1976   BREAST LUMPECTOMY Right 02/2015   COLONOSCOPY  09/30/2016   KNEE ARTHROSCOPY Left 03/26/2008   PLANTAR'S WART EXCISION Left    x 2   PORT-A-CATH REMOVAL Left 04/06/2015   Procedure: REMOVAL PORT-A-CATH;  Surgeon: Rolm Bookbinder, MD;  Location: [facility];  Service: General;  Laterality: Left;   PORTACATH PLACEMENT N/A 11/27/2014   Procedure: INSERTION  PORT-A-CATH;  Surgeon: Rolm Bookbinder, MD;  Location: [facility];  Service: General;  Laterality: N/A;   RADIOACTIVE SEED GUIDED PARTIAL MASTECTOMY WITH AXILLARY SENTINEL LYMPH NODE BIOPSY Right 03/02/2015   Procedure: RADIOACTIVE SEED GUIDED RIGHT PARTIAL MASTECTOMY WITH RIGHT AXILLARY SENTINEL LYMPH NODE BIOPSY;  Surgeon: Rolm Bookbinder, MD;  Location: [facility];  Service: General;  Laterality: Right;   SALIVARY STONE REMOVAL Right 1972   TONSILLECTOMY  1970s   TRANSTHORACIC ECHOCARDIOGRAM  01/16/2020   EF 60 to 65%.  GRII DD.  No R WMA.  Normal RV size and function.  Rheumatic mitral valve with moderate thickening-hockey-stick appearing.  Moderate MR with mild MS.  Moderate LA dilation.  (Recommend follow-up in 2 to 3 years)   [place]    Immunization History  Administered Date(s) Administered   Influenza Split 07/06/2011   Influenza,inj,Quad PF,6+ Mos 09/02/2013, 06/25/2015, 09/06/2016, 09/08/2017, 08/03/2018, 11/13/2020, 07/02/2021   PFIZER Comirnaty(Gray Top)Covid-19 Tri-Sucrose Vaccine 05/21/2021   PFIZER(Purple Top)SARS-COV-2 Vaccination 12/31/2019, 01/21/2020, 09/11/2020   Pneumococcal Polysaccharide-23 07/06/2012   Tdap 07/06/2011   Zoster Recombinat (Shingrix) 08/10/2018, 09/12/2018    MEDICATIONS/ALLERGIES   Current Meds  Medication Sig   albuterol (PROVENTIL HFA;VENTOLIN HFA) 108 (90 BASE) MCG/ACT inhaler Inhale into the lungs every 6 (six) hours as needed for wheezing or shortness of breath.   aspirin 81 MG tablet Take 1 tablet (81 mg total) by mouth daily.   chlorthalidone (HYGROTON) 25 MG tablet TAKE 1 TABLET(25 MG) BY MOUTH EVERY OTHER DAY   COVID-19 mRNA Vac-TriS, Pfizer, SUSP injection Inject into the muscle.   enalapril (VASOTEC) 20 MG tablet TAKE 1 TABLET(20 MG) BY MOUTH AT BEDTIME   fluticasone (FLONASE) 50 MCG/ACT nasal spray Place 2 sprays into both nostrils as needed.   influenza vac split quadrivalent PF  (FLUARIX) 0.5 ML injection Inject into the muscle.   metoprolol succinate (TOPROL-XL) 50 MG 24 hr tablet TAKE 1 TABLET BY MOUTH EVERY DAY   metoprolol tartrate (LOPRESSOR) 25 MG tablet TAKE 1 TABLET BY MOUTH 2 TO 3 TIMES DAILY AS NEEDED FOR HEART FLUTTERING    Allergies  Allergen Reactions   Sulfa Antibiotics Other (See Comments)    UNKNOWN    SOCIAL HISTORY/FAMILY HISTORY   Reviewed in Epic:  Pertinent findings:  Social History   Tobacco Use   Smoking status: Every Day    Packs/day: 0.50    Years:  43.00    Pack years: 21.50    Types: Cigarettes   Smokeless tobacco: Former    Quit date: 09/09/2016   Tobacco comments:    10 cig./day  Vaping Use   Vaping Use: Never used  Substance Use Topics   Alcohol use: Yes    Alcohol/week: 5.0 standard drinks    Types: 5 Standard drinks or equivalent per week    Comment: occ    Drug use: No   Social History   Social History Narrative   Married with 2 daughters- born 8 and 25 ([name] died in [place] 01-24-2016)   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  Has been doing part time work with [employer] -- had 6 active clients, but the most important one is her Daughter.   Regular exercise:  No   Caffeine Use: none; Current 1ppd smoker x > 39 yrs; EtOH ~4 oz/week    OBJCTIVE -PE, EKG, labs   Wt Readings from Last 3 Encounters:  08/16/21 228 lb 1.6 oz (103.5 kg)  08/04/21 223 lb 12.8 oz (101.5 kg)  04/20/21 223 lb 6.4 oz (101.3 kg)    Physical Exam: BP 130/70 (BP Location: Left Arm)   Pulse 66   Ht 5\' 1"  (1.549 m)   Wt 223 lb 12.8 oz (101.5 kg)   LMP 10/10/1996   SpO2 99%   BMI 42.29 kg/m  Physical Exam Vitals reviewed.  Constitutional:      General: She is not in acute distress.    Appearance: She is not ill-appearing or toxic-appearing.     Comments: Morbidly obese.  Well-groomed.  HENT:     Head: Normocephalic and atraumatic.  Neck:     Vascular: No carotid bruit or JVD  (Unable to assess).  Cardiovascular:     Rate and Rhythm: Normal rate and regular rhythm. No extrasystoles are present.    Pulses: Decreased pulses (Difficult palpated body habitus and mild puffy swelling.).     Heart sounds: S1 normal and S2 normal. Heart sounds are distant. Murmur heard.  Harsh crescendo-decrescendo early systolic murmur is present with a grade of 2/6 at the upper right sternal border radiating to the neck.  Medium-pitched blowing holosystolic murmur of grade 1/6 is also present at the apex radiating to the back.    No friction rub.  Pulmonary:     Effort: Pulmonary effort is normal. No respiratory distress.     Breath sounds: Normal breath sounds. No wheezing, rhonchi or rales.  Chest:     Chest wall: No tenderness.  Musculoskeletal:        General: Swelling (Mild puffy swelling bilateral ankles.) present.     Cervical back: Normal range of motion.  Skin:    General: Skin is warm and dry.  Neurological:     General: No focal deficit present.     Mental Status: She is alert and oriented to person, place, and time. Mental status is at baseline.     Gait: Gait normal.  Psychiatric:        Mood and Affect: Mood normal.        Behavior: Behavior normal.        Thought Content: Thought content normal.        Judgment: Judgment normal.     Adult ECG Report  Rate: 66;  Rhythm: normal sinus rhythm and left atrial enlargement.  Otherwise normal axis, intervals and durations. ;   Narrative Interpretation: Stable  Recent Labs:    Lipids 07/26/2019: TC 197, TG 59, HDL 67, LDL 121.  Lab Results  Component Value Date   CHOL 209 (H) 08/10/2018   HDL 76 08/10/2018   LDLCALC 116 (H) 08/10/2018   TRIG 73 08/10/2018   CHOLHDL 2.8 08/10/2018   Lab Results  Component Value Date   CREATININE 0.84 08/16/2021   BUN 13 08/16/2021   NA 142 08/16/2021   K 3.9 08/16/2021   CL 107 08/16/2021   CO2 26 08/16/2021   CBC Latest Ref Rng & Units 08/16/2021 08/13/2020 08/10/2018   WBC 4.0 - 10.5 K/uL 6.8 6.5 6.5  Hemoglobin 12.0 - 15.0 g/dL 13.4 14.1 14.4  Hematocrit 36.0 - 46.0 % 40.2 42.6 42.0  Platelets 150 - 400 K/uL 206 231 235    No results found for: HGBA1C Lab Results  Component Value Date   TSH 0.83 10/15/2019    ==================================================  COVID-19 Education: The signs and symptoms of COVID-19 were discussed with the patient and how to seek care for testing (follow up with PCP or arrange E-visit).    I spent a total of 22 minutes with the patient spent in direct patient consultation.  Additional time spent with chart review  / charting (studies, outside notes, etc): 16 min Total Time: 38 min  Current medicines are reviewed at length with the patient today.  (+/- concerns) n/a  This visit occurred during the SARS-CoV-2 public health emergency.  Safety protocols were in place, including screening questions prior to the visit, additional usage of staff PPE, and extensive cleaning of exam room while observing appropriate contact time as indicated for disinfecting solutions.  Notice: This dictation was prepared with Dragon dictation along with smart phrase technology. Any transcriptional errors that result from this process are unintentional and may not be corrected upon review.  Patient Instructions / Medication Changes & Studies & Tests Ordered   Patient Instructions  Medication Instructions:   No changes--- take your  as needed medication Metoprolol tartrate  -- palpations  *If you need a refill on your cardiac medications before your next appointment, please call your pharmacy*   Lab Work:  Not needed If you have labs (blood work) drawn today and your tests are completely normal, you will receive your results only by: MyChart Message (if you have MyChart) OR A paper copy in the mail If you have any lab test that is abnormal or we need to change your treatment, we will call you to review the  results.   Testing/Procedure: Will be schedule at [facility] [address] in Aug. or Sept 2023 Your physician has requested that you have an echocardiogram. Echocardiography is a painless test that uses sound waves to create images of your heart. It provides your doctor with information about the size and shape of your heart and how well your heart's chambers and valves are working. This procedure takes approximately one hour. There are no restrictions for this procedure.   Follow-Up: At [facility], you and your health needs are our priority.  As part of our continuing mission to provide you with exceptional heart care, we have created designated Provider Care Teams.  These Care Teams include your primary Cardiologist (physician) and Advanced Practice Providers (APPs -  Physician Assistants and Nurse Practitioners) who all work together to provide you with the care you need, when you need it.  We recommend signing up for the patient portal called "MyChart".  Sign up information is provided on  this After Visit Summary.  MyChart is used to connect with patients for Virtual Visits (Telemedicine).  Patients are able to view lab/test results, encounter notes, upcoming appointments, etc.  Non-urgent messages can be sent to your provider as well.   To learn more about what you can do with MyChart, go to [email or web address].    Your next appointment:   12 month(s)  The format for your next appointment:   In Person  Provider:   Glenetta Hew, MD     Studies Ordered:   Orders Placed This Encounter  Procedures   EKG 12-Lead   ECHOCARDIOGRAM COMPLETE     Glenetta Hew, M.D., M.S. Interventional Cardiologist   Pager # [phone] Phone # [phone] [address]. [place], [place] [address]   Thank you for choosing Heartcare at [facility]!!

## 2021-08-05 ENCOUNTER — Ambulatory Visit: Payer: 59

## 2021-08-11 ENCOUNTER — Other Ambulatory Visit: Payer: Self-pay

## 2021-08-11 ENCOUNTER — Ambulatory Visit (INDEPENDENT_AMBULATORY_CARE_PROVIDER_SITE_OTHER): Payer: 59

## 2021-08-11 DIAGNOSIS — Z1231 Encounter for screening mammogram for malignant neoplasm of breast: Secondary | ICD-10-CM | POA: Diagnosis not present

## 2021-08-12 NOTE — Progress Notes (Unsigned)
Patient advised of results.

## 2021-08-13 ENCOUNTER — Other Ambulatory Visit: Payer: Self-pay | Admitting: *Deleted

## 2021-08-13 ENCOUNTER — Telehealth: Payer: Self-pay | Admitting: Family

## 2021-08-13 ENCOUNTER — Other Ambulatory Visit: Payer: Self-pay | Admitting: Family

## 2021-08-13 DIAGNOSIS — Z171 Estrogen receptor negative status [ER-]: Secondary | ICD-10-CM

## 2021-08-13 DIAGNOSIS — C50411 Malignant neoplasm of upper-outer quadrant of right female breast: Secondary | ICD-10-CM

## 2021-08-13 DIAGNOSIS — R928 Other abnormal and inconclusive findings on diagnostic imaging of breast: Secondary | ICD-10-CM

## 2021-08-13 NOTE — Telephone Encounter (Signed)
Pt called and is a bit worried about her results, she feels like she is not getting communication about what is going on and she doesn't want to wait the whole weekend. She spoke with a nurse and they told her she needed to get more testing done at a different department, but that department does not do the testing she needs . She thinks the department she was referred to for a diagnostic mammogram does not take her insurance and she has already been turned away previously. She feels like she is jumping from department to department and it just getting lost in translation. She can't seem to get any clear answers on what she needs to do next and it is causing a lot of stress. She stated she just wants to speak with Katie Jacobson to get to the bottom of all of her confusion. Please advise

## 2021-08-13 NOTE — Telephone Encounter (Signed)
Patient will call next week  to let us know who can do her 3D diagnostic mammogram under her current insurance so that we can enter a new referral.

## 2021-08-16 ENCOUNTER — Telehealth: Payer: Self-pay | Admitting: Family

## 2021-08-16 ENCOUNTER — Inpatient Hospital Stay: Payer: 59 | Attending: Oncology | Admitting: Oncology

## 2021-08-16 ENCOUNTER — Other Ambulatory Visit: Payer: Self-pay

## 2021-08-16 ENCOUNTER — Inpatient Hospital Stay: Payer: 59

## 2021-08-16 VITALS — BP 161/76 | HR 75 | Temp 97.7°F | Resp 18 | Ht 61.0 in | Wt 228.1 lb

## 2021-08-16 DIAGNOSIS — Z823 Family history of stroke: Secondary | ICD-10-CM | POA: Diagnosis not present

## 2021-08-16 DIAGNOSIS — Z171 Estrogen receptor negative status [ER-]: Secondary | ICD-10-CM | POA: Insufficient documentation

## 2021-08-16 DIAGNOSIS — Z923 Personal history of irradiation: Secondary | ICD-10-CM | POA: Insufficient documentation

## 2021-08-16 DIAGNOSIS — Z87442 Personal history of urinary calculi: Secondary | ICD-10-CM | POA: Insufficient documentation

## 2021-08-16 DIAGNOSIS — I341 Nonrheumatic mitral (valve) prolapse: Secondary | ICD-10-CM | POA: Insufficient documentation

## 2021-08-16 DIAGNOSIS — Z9221 Personal history of antineoplastic chemotherapy: Secondary | ICD-10-CM | POA: Diagnosis not present

## 2021-08-16 DIAGNOSIS — Z882 Allergy status to sulfonamides status: Secondary | ICD-10-CM | POA: Insufficient documentation

## 2021-08-16 DIAGNOSIS — F17210 Nicotine dependence, cigarettes, uncomplicated: Secondary | ICD-10-CM | POA: Diagnosis not present

## 2021-08-16 DIAGNOSIS — Z841 Family history of disorders of kidney and ureter: Secondary | ICD-10-CM | POA: Diagnosis not present

## 2021-08-16 DIAGNOSIS — C50411 Malignant neoplasm of upper-outer quadrant of right female breast: Secondary | ICD-10-CM | POA: Diagnosis present

## 2021-08-16 DIAGNOSIS — Z9049 Acquired absence of other specified parts of digestive tract: Secondary | ICD-10-CM | POA: Insufficient documentation

## 2021-08-16 DIAGNOSIS — Z833 Family history of diabetes mellitus: Secondary | ICD-10-CM | POA: Insufficient documentation

## 2021-08-16 DIAGNOSIS — R921 Mammographic calcification found on diagnostic imaging of breast: Secondary | ICD-10-CM

## 2021-08-16 DIAGNOSIS — Z803 Family history of malignant neoplasm of breast: Secondary | ICD-10-CM | POA: Insufficient documentation

## 2021-08-16 DIAGNOSIS — Z0000 Encounter for general adult medical examination without abnormal findings: Secondary | ICD-10-CM

## 2021-08-16 DIAGNOSIS — Z818 Family history of other mental and behavioral disorders: Secondary | ICD-10-CM | POA: Insufficient documentation

## 2021-08-16 DIAGNOSIS — Z8371 Family history of colonic polyps: Secondary | ICD-10-CM | POA: Diagnosis not present

## 2021-08-16 DIAGNOSIS — Z8249 Family history of ischemic heart disease and other diseases of the circulatory system: Secondary | ICD-10-CM | POA: Insufficient documentation

## 2021-08-16 LAB — CMP (CANCER CENTER ONLY)
ALT: 13 U/L (ref 0–44)
AST: 13 U/L — ABNORMAL LOW (ref 15–41)
Albumin: 3.4 g/dL — ABNORMAL LOW (ref 3.5–5.0)
Alkaline Phosphatase: 74 U/L (ref 38–126)
Anion gap: 9 (ref 5–15)
BUN: 13 mg/dL (ref 8–23)
CO2: 26 mmol/L (ref 22–32)
Calcium: 8.9 mg/dL (ref 8.9–10.3)
Chloride: 107 mmol/L (ref 98–111)
Creatinine: 0.84 mg/dL (ref 0.44–1.00)
GFR, Estimated: >60 mL/min (ref >60)
Glucose, Bld: 81 mg/dL (ref 70–99)
Potassium: 3.9 mmol/L (ref 3.5–5.1)
Sodium: 142 mmol/L (ref 135–145)
Total Bilirubin: 0.4 mg/dL (ref 0.3–1.2)
Total Protein: 7 g/dL (ref 6.5–8.1)

## 2021-08-16 LAB — CBC WITH DIFFERENTIAL (CANCER CENTER ONLY)
Abs Immature Granulocytes: 0.01 10*3/uL (ref 0.00–0.07)
Basophils Absolute: 0 10*3/uL (ref 0.0–0.1)
Basophils Relative: 1 %
Eosinophils Absolute: 0.2 10*3/uL (ref 0.0–0.5)
Eosinophils Relative: 2 %
HCT: 40.2 % (ref 36.0–46.0)
Hemoglobin: 13.4 g/dL (ref 12.0–15.0)
Immature Granulocytes: 0 %
Lymphocytes Relative: 39 %
Lymphs Abs: 2.7 10*3/uL (ref 0.7–4.0)
MCH: 31.5 pg (ref 26.0–34.0)
MCHC: 33.3 g/dL (ref 30.0–36.0)
MCV: 94.6 fL (ref 80.0–100.0)
Monocytes Absolute: 0.6 10*3/uL (ref 0.1–1.0)
Monocytes Relative: 9 %
Neutro Abs: 3.4 10*3/uL (ref 1.7–7.7)
Neutrophils Relative %: 49 %
Platelet Count: 206 10*3/uL (ref 150–400)
RBC: 4.25 MIL/uL (ref 3.87–5.11)
RDW: 16.1 % — ABNORMAL HIGH (ref 11.5–15.5)
WBC Count: 6.8 10*3/uL (ref 4.0–10.5)
nRBC: 0 % (ref 0.0–0.2)

## 2021-08-16 NOTE — Telephone Encounter (Signed)
Pt called regarding 3D diagnostic mammogram. Pt was advised that pcp will have to put order in.  [facility].  Tel: [phone] and Fax: [phone].

## 2021-08-16 NOTE — Progress Notes (Signed)
[facility]  [phone] [phone]     ID: Katie Jacobson DOB: Dec 21, 1955  MR#: [id number]  HQI#:[id number]  Patient Care Team: [name], NP as PCP - General (Internal Medicine) Marykay Lex, MD as PCP - Cardiology (Cardiology) Miguel Aschoff, MD (Inactive) (Obstetrics and Gynecology) Emelia Loron, MD as Consulting Physician (General Surgery) Ambermarie Honeyman, Valentino Hue, MD as Consulting Physician (Oncology) Lonie Peak, MD as Attending Physician (Radiation Oncology) Donnelly Angelica, RN as Registered Nurse Pershing Proud, RN as Registered Nurse Armbruster, Willaim Rayas, MD as Consulting Physician (Gastroenterology) OTHER MD:  CHIEF COMPLAINT: Triple negative breast cancer  CURRENT TREATMENT: observation   INTERVAL HISTORY: Katie Jacobson returns today for follow-up of her triple negative breast cancer. She continues on observation.   Since her last visit, she underwent bilateral screening mammography with [facility]11[facility]2021 showing: breast density category B; further evaluation is suggested for calcifications in right breast.  An order is in place for right diagnostic mammogram, but this has not yet been scheduled.    REVIEW OF SYSTEMS: Katie Jacobson is having some insurance problems and that is the reason she ended up getting a screening mammogram where they had no prior images for comparison.  Hopefully that whole situation can be resolved.  Aside from that she feels fine, has her normal activities, and a detailed review of systems was noncontributory   COVID 19 VACCINATION STATUS: Status post vaccine x2   BREAST CANCER HISTORY: From the original intake note:  The patient herself palpated a mass in her right breast there is she brought it to her gynecologist, Dr. Charlott Rakes attention and he obtained screening mammography which suggested an abnormality in the right breast. On 10/24/2014 the patient underwent right  diagnostic mammography and [facility]ography at the [facility]. The breast density was category B. There was an ill-defined mass in the upper outer quadrant of the right breast which was palpable on exam. By ultrasound this was irregular and hypoechoic measuring 1.2 cm. Evaluation of the right axilla was negative.  Biopsy of the mass in question showed an invasive ductal carcinoma, grade 2 or 3, triple negative, with an MIB-1 of 19%.  [name] case was presented at the multidisciplinary breast cancer conference 11/19/2014, where was suggested she would benefit from genetics testing. Since this would mandate a significant delay in her final surgery, neoadjuvant chemotherapy was indicated.  The patient's subsequent history is as detailed below   PAST MEDICAL HISTORY: Past Medical History:  Diagnosis Date   Allergy    Breast cancer of upper-outer quadrant of right female breast (HCC) 11/14/2014   Treated with chemotherapy and radiation   History of chemotherapy    finished chemo 02/03/2015   History of kidney stones    History of seizures    as a child - unknown cause - states was never on anticonvulsants   Hypertension    states under control with meds., has been on med. x "years"   Mitral valve prolapse 1990   Follow-up echocardiogram not show mitral prolapse.  Shows mild mitral vegetation.   Osteoporosis    Personal history of chemotherapy    Personal history of radiation therapy    Seasonal allergies    Seizures (HCC)    as a child-none as a adult    PAST SURGICAL HISTORY: Past Surgical History:  Procedure Laterality Date   ABDOMINAL HYSTERECTOMY  1998   partial   APPENDECTOMY  1976   BREAST LUMPECTOMY Right  02/2015   COLONOSCOPY  09/30/2016   KNEE ARTHROSCOPY Left 03/26/2008   PLANTAR'S WART EXCISION Left    x 2   PORT-A-CATH REMOVAL Left 04/06/2015   Procedure: REMOVAL PORT-A-CATH;  Surgeon: Emelia Loron, MD;  Location: [facility];  Service:  General;  Laterality: Left;   PORTACATH PLACEMENT N/A 11/27/2014   Procedure: INSERTION PORT-A-CATH;  Surgeon: Emelia Loron, MD;  Location: [facility];  Service: General;  Laterality: N/A;   RADIOACTIVE SEED GUIDED PARTIAL MASTECTOMY WITH AXILLARY SENTINEL LYMPH NODE BIOPSY Right 03/02/2015   Procedure: RADIOACTIVE SEED GUIDED RIGHT PARTIAL MASTECTOMY WITH RIGHT AXILLARY SENTINEL LYMPH NODE BIOPSY;  Surgeon: Emelia Loron, MD;  Location: [facility];  Service: General;  Laterality: Right;   SALIVARY STONE REMOVAL Right 1972   TONSILLECTOMY  1970s   TRANSTHORACIC ECHOCARDIOGRAM  01/16/2020   EF 60 to 65%.  GRII DD.  No R WMA.  Normal RV size and function.  Rheumatic mitral valve with moderate thickening-hockey-stick appearing.  Moderate MR with mild MS.  Moderate LA dilation.  (Recommend follow-up in 2 to 3 years)   TUBAL LIGATION  1996    FAMILY HISTORY Family History  Problem Relation Age of Onset   Heart disease Mother    Stroke Mother    Hypertension Mother    Diabetes Mother    Diabetes Sister    Kidney disease Brother    Mental retardation Brother    Diabetes Sister    Heart attack Father    Breast cancer Cousin        deceased 90   Colon polyps Daughter    Colon cancer Neg Hx    Esophageal cancer Neg Hx    Rectal cancer Neg Hx    Stomach cancer Neg Hx    the patient knows little about her father. Her mother died at at the age of 33 following a stroke. The patient had 2 brothers, 2 sisters. The only history of breast or ovarian cancer in the family is a first cousin on the mother's side diagnosed with breast cancer at age 63   GYNECOLOGIC HISTORY:  Patient's last menstrual period was 10/10/1996. Menarche age 52, first live birth age 89. The patient is GX P2. She status post simple hysterectomy without salpingo-oophorectomy. She did not take hormone replacement.   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 The patient's husband, Windy Fast "Ron" Cunningham works for [employer] in the pattern shop. He also works as a Geologist, engineering. Daughter Katie Jacobson lives in [place] and works in Presenter, broadcasting. Daughter [name] Beames died in an AA 09-06-16.    ADVANCED DIRECTIVES: In the absence of any documentation to the contrary, the patient's spouse is their HCPOA.    HEALTH MAINTENANCE: Social History   Tobacco Use   Smoking status: Every Day    Packs/day: 0.50    Years: 43.00    Pack years: 21.50    Types: Cigarettes   Smokeless tobacco: Former    Quit date: 09/09/2016   Tobacco comments:    10 cig./day  Vaping Use   Vaping Use: Never used  Substance Use Topics   Alcohol use: Yes    Alcohol/week: 5.0 standard drinks    Types: 5 Standard drinks or equivalent per week    Comment: occ    Drug use: No     Colonoscopy: 11/2019, (Dr. Adela Lank), repeat due 07-Sep-2027  PAP:  Bone density:  Lipid  panel:  Allergies  Allergen Reactions   Sulfa Antibiotics Other (See Comments)    UNKNOWN    Current Outpatient Medications  Medication Sig Dispense Refill   albuterol (PROVENTIL HFA;VENTOLIN HFA) 108 (90 BASE) MCG/ACT inhaler Inhale into the lungs every 6 (six) hours as needed for wheezing or shortness of breath.     aspirin 81 MG tablet Take 1 tablet (81 mg total) by mouth daily. 30 tablet 0   chlorthalidone (HYGROTON) 25 MG tablet TAKE 1 TABLET(25 MG) BY MOUTH EVERY OTHER DAY 45 tablet 3   cholecalciferol (VITAMIN D3) 25 MCG (1000 UT) tablet Take 1 tablet (1,000 Units total) by mouth daily. (Patient not taking: Reported on 08/04/2021)     COVID-19 mRNA Vac-TriS, Pfizer, SUSP injection Inject into the muscle. 0.3 mL 0   enalapril (VASOTEC) 20 MG tablet TAKE 1 TABLET(20 MG) BY MOUTH AT BEDTIME 90 tablet 0   fluticasone (FLONASE) 50 MCG/ACT nasal spray Place 2 sprays into both nostrils as needed. 9.9 mL 5   influenza vac split quadrivalent PF (FLUARIX) 0.5 ML injection Inject into the muscle.  0.5 mL 0   methocarbamol (ROBAXIN) 500 MG tablet Take 1 tablet (500 mg total) by mouth every 8 (eight) hours as needed for muscle spasms. (Patient not taking: Reported on 08/04/2021) 20 tablet 0   metoprolol succinate (TOPROL-XL) 50 MG 24 hr tablet TAKE 1 TABLET BY MOUTH EVERY DAY 90 tablet 1   metoprolol tartrate (LOPRESSOR) 25 MG tablet TAKE 1 TABLET BY MOUTH 2 TO 3 TIMES DAILY AS NEEDED FOR HEART FLUTTERING 30 tablet 6   No current facility-administered medications for this visit.    OBJECTIVE: African-American woman in no acute distress  Vitals:   08/16/21 1439  BP: (!) 161/76  Pulse: 75  Resp: 18  Temp: 97.7 F (36.5 C)  SpO2: 100%      Body mass index is 43.1 kg/m.    ECOG FS:1 - Symptomatic but completely ambulatory  Sclerae unicteric, EOMs intact Wearing a mask No cervical or supraclavicular adenopathy Lungs no rales or rhonchi Heart regular rate and rhythm Abd soft, obese, nontender, positive bowel sounds MSK no focal spinal tenderness, no upper extremity lymphedema Neuro: nonfocal, well oriented, appropriate affect Breasts: The right breast is status postlumpectomy and radiation with no evidence of local recurrence.  The left breast and both axillae are benign.   LAB RESULTS:  CMP     Component Value Date/Time   NA 142 08/16/2021 1418   NA 139 12/28/2016 1359   K 3.9 08/16/2021 1418   K 3.6 12/28/2016 1359   CL 107 08/16/2021 1418   CO2 26 08/16/2021 1418   CO2 25 12/28/2016 1359   GLUCOSE 81 08/16/2021 1418   GLUCOSE 85 12/28/2016 1359   BUN 13 08/16/2021 1418   BUN 15.6 12/28/2016 1359   CREATININE 0.84 08/16/2021 1418   CREATININE 0.93 08/10/2018 1507   CREATININE 0.9 12/28/2016 1359   CALCIUM 8.9 08/16/2021 1418   CALCIUM 9.7 12/28/2016 1359   PROT 7.0 08/16/2021 1418   PROT 7.5 12/28/2016 1359   ALBUMIN 3.4 (L) 08/16/2021 1418   ALBUMIN 3.7 12/28/2016 1359   AST 13 (L) 08/16/2021 1418   AST 14 12/28/2016 1359   ALT 13 08/16/2021 1418   ALT  17 12/28/2016 1359   ALKPHOS 74 08/16/2021 1418   ALKPHOS 86 12/28/2016 1359   BILITOT 0.4 08/16/2021 1418   BILITOT 0.35 12/28/2016 1359   GFRNONAA >60 08/16/2021 1418   GFRNONAA 70 09/02/2013  1128   GFRAA >60 12/28/2017 1342   GFRAA 81 09/02/2013 1128    INo results found for: SPEP, UPEP  Lab Results  Component Value Date   WBC 6.8 08/16/2021   NEUTROABS 3.4 08/16/2021   HGB 13.4 08/16/2021   HCT 40.2 08/16/2021   MCV 94.6 08/16/2021   PLT 206 08/16/2021      Chemistry      Component Value Date/Time   NA 142 08/16/2021 1418   NA 139 12/28/2016 1359   K 3.9 08/16/2021 1418   K 3.6 12/28/2016 1359   CL 107 08/16/2021 1418   CO2 26 08/16/2021 1418   CO2 25 12/28/2016 1359   BUN 13 08/16/2021 1418   BUN 15.6 12/28/2016 1359   CREATININE 0.84 08/16/2021 1418   CREATININE 0.93 08/10/2018 1507   CREATININE 0.9 12/28/2016 1359      Component Value Date/Time   CALCIUM 8.9 08/16/2021 1418   CALCIUM 9.7 12/28/2016 1359   ALKPHOS 74 08/16/2021 1418   ALKPHOS 86 12/28/2016 1359   AST 13 (L) 08/16/2021 1418   AST 14 12/28/2016 1359   ALT 13 08/16/2021 1418   ALT 17 12/28/2016 1359   BILITOT 0.4 08/16/2021 1418   BILITOT 0.35 12/28/2016 1359       No results found for: LABCA2  No components found for: LABCA125  No results for input(s): INR in the last 168 hours.  Urinalysis    Component Value Date/Time   COLORURINE YELLOW 08/10/2018 1507   APPEARANCEUR TURBID (A) 08/10/2018 1507   LABSPEC 1.024 08/10/2018 1507   PHURINE 5.5 08/10/2018 1507   GLUCOSEU NEGATIVE 08/10/2018 1507   GLUCOSEU NEGATIVE 09/06/2016 1225   HGBUR 1+ (A) 08/10/2018 1507   BILIRUBINUR NEGATIVE 09/06/2016 1225   KETONESUR 1+ (A) 08/10/2018 1507   PROTEINUR NEGATIVE 08/10/2018 1507   UROBILINOGEN 0.2 09/06/2016 1225   NITRITE NEGATIVE 08/10/2018 1507   LEUKOCYTESUR NEGATIVE 08/10/2018 1507    STUDIES: MM 3D SCREEN BREAST BILATERAL  Result Date: 08/12/2021 CLINICAL DATA:  Screening.  EXAM: DIGITAL SCREENING BILATERAL MAMMOGRAM WITH TOMOSYNTHESIS AND CAD TECHNIQUE: Bilateral screening digital craniocaudal and mediolateral oblique mammograms were obtained. Bilateral screening digital breast tomosynthesis was performed. The images were evaluated with computer-aided detection. COMPARISON:  Previous exam(s). ACR Breast Density Category b: There are scattered areas of fibroglandular density. FINDINGS: In the right breast, calcifications warrant further evaluation with magnified views. In the left breast, no findings suspicious for malignancy. IMPRESSION: Further evaluation is suggested for calcifications in the right breast. RECOMMENDATION: Diagnostic mammogram of the right breast. (Code:[id number]) The patient will be contacted regarding the findings, and additional imaging will be scheduled. BI-RADS CATEGORY  0: Incomplete. Need additional imaging evaluation and/or prior mammograms for comparison. Electronically Signed   By: Sherian Rein M.D.   On: 08/12/2021 09:15        ASSESSMENT: 65 y.o. [place], [place] woman status post right breast upper outer quadrant biopsy 11/12/2014 for a clinical T1c N0, stage IA invasive ductal carcinoma, triple negative, with an MIB-1 of 19%  (1) neoadjuvant chemotherapy consisting of cyclophosphamide and docetaxel 4 given every 21 days with Neulasta support, first dose 12/02/2014, completed 02/03/2015  (2) genetics testing through the BreastNext gene panel at [facility] showed no deleterious mutations in ATM, BARD1, BRCA1, BRCA2, BRIP1, CDH1, CHEK2, MRE11A, MUTYH, NBN, NF1, PALB2, PTEN, RAD50, RAD51C, RAD51D, oe TP53.  (3) status post right lumpectomy and sentinel node sampling 03/02/2015 for a ypT1b ypN0 stage IA invasive ductal carcinoma, grade  1, with negative margins. Repeat HER-2 was again not amplified.  (4) radiation therapy completed August 2016   PLAN: Adea is now 6-1/2 years out from definitive surgery for her triple negative  breast cancer with no evidence of disease recurrence.  This is very favorable.  She understands that I would not be a comfortable releasing her from follow-up, but she tells me she derive significant benefit from seeing [place] on a yearly basis and she requested at least 1 more visit so she could establish herself with another oncologist since I will be retiring in 1 month.  I expect the repeat diagnostic mammogram coming up later this week 2 showed no significant change.  Otherwise of course we will intervene as needed  She will see [place] again December 2023.  She knows to call for any other issues that may develop before then.  Total encounter time 25 minutes.   Tawsha Terrero, Valentino Hue, MD  08/16/21 6:17 PM Medical Oncology and Hematology [facility] [address] [place], [place] [address] Tel. [phone]    Fax. [phone]   I, Mickie Bail, am acting as scribe for Dr. Valentino Hue. Elenie Coven.  I, Ruthann Cancer MD, have reviewed the above documentation for accuracy and completeness, and I agree with the above.   *Total Encounter Time as defined by the Centers for Medicare and Medicaid Services includes, in addition to the face-to-face time of a patient visit (documented in the note above) non-face-to-face time: obtaining and reviewing outside history, ordering and reviewing medications, tests or procedures, care coordination (communications with other health care professionals or caregivers) and documentation in the medical record.

## 2021-08-17 NOTE — Telephone Encounter (Signed)
Please enter order, this is where her insurance will cover a 3D diagnostic

## 2021-08-20 NOTE — Telephone Encounter (Signed)
Pt is calling again regarding these orders, stated she called [facility] and they do not have these orders. Please advise.

## 2021-08-29 ENCOUNTER — Encounter: Payer: Self-pay | Admitting: Cardiology

## 2021-08-29 NOTE — Assessment & Plan Note (Signed)
Blood pressure looks pretty good here she is on Toprol, along with enalapril and chlorthalidone.  She does have PRN blood pressure which she can use for tachycardic spells.  She just has not been using it.

## 2021-08-29 NOTE — Assessment & Plan Note (Signed)
Moderate MR with some mitral prolapse on previous echocardiogram.  Will be due to follow-up every 2 years which would mean echocardiogram in September of next year.  She does have a soft murmur heard on exam, but not dramatic.  Relatively asymptomatic.  Goal will be to maintain stable blood pressures.  She is having palpitations and she is on a stable dose of beta-blocker.  On ACE inhibitor for afterload reduction and chlorthalidone for blood pressure control/diuretic.

## 2021-08-29 NOTE — Assessment & Plan Note (Signed)
Still jokes about quitting smoking.  Has not yet taken it seriously.  She was able to quit back in 2017 but wonder back to her again.  Smoking cessation instruction/counseling given:  counseled patient on the dangers of tobacco use, advised patient to stop smoking, and reviewed strategies to maximize success  4-5 min.

## 2021-08-29 NOTE — Assessment & Plan Note (Signed)
The patient understands the need to lose weight with diet and exercise. We have discussed specific strategies for this.  

## 2021-08-29 NOTE — Assessment & Plan Note (Signed)
Palpitations are pretty well controlled STEMI dose of Toprol 50 mg daily.  She does have occasional breakthrough spells and I told her that she can use the as needed Lopressor if the spells should happen.  This can also be used for accelerated hypertension spells.

## 2021-08-29 NOTE — Assessment & Plan Note (Signed)
Unfortunately, I do not have any recent labs to review except for labs dated back from October 2020.  She did not get any labs done last year.  She did get labs with her PCP this year.  Would like to see LDL less than 100.  Not currently on any medications.

## 2021-09-06 ENCOUNTER — Other Ambulatory Visit: Payer: Self-pay | Admitting: Cardiology

## 2021-09-09 LAB — HM MAMMOGRAPHY: HM Mammogram: ABNORMAL — AB (ref 0–4)

## 2021-09-10 ENCOUNTER — Other Ambulatory Visit: Payer: Self-pay

## 2021-09-10 DIAGNOSIS — C50411 Malignant neoplasm of upper-outer quadrant of right female breast: Secondary | ICD-10-CM

## 2021-09-15 ENCOUNTER — Telehealth: Payer: Self-pay | Admitting: Family

## 2021-09-15 ENCOUNTER — Telehealth: Payer: Self-pay | Admitting: *Deleted

## 2021-09-15 ENCOUNTER — Other Ambulatory Visit: Payer: Self-pay | Admitting: *Deleted

## 2021-09-15 DIAGNOSIS — Z171 Estrogen receptor negative status [ER-]: Secondary | ICD-10-CM

## 2021-09-15 DIAGNOSIS — R928 Other abnormal and inconclusive findings on diagnostic imaging of breast: Secondary | ICD-10-CM

## 2021-09-15 DIAGNOSIS — C50411 Malignant neoplasm of upper-outer quadrant of right female breast: Secondary | ICD-10-CM

## 2021-09-15 NOTE — Telephone Encounter (Signed)
This RN spoke with pt today per her return call- she states situation with obtaining mammograms recently not at [place] due to insurance with noted abnormality.  She now needs a biopsy but per her communication it needs to be done at [facility].  This RN reviewed recent mammograms and placed order for U/S with biopsy to be done ASAP.

## 2021-09-15 NOTE — Telephone Encounter (Signed)
Derry Lory- [facility]: [phone]  Called to discuss care plans for pt.

## 2021-09-17 NOTE — Telephone Encounter (Signed)
Lvm for Mrs. Stone to call back about patient.

## 2021-09-20 ENCOUNTER — Telehealth: Payer: Self-pay | Admitting: *Deleted

## 2021-09-20 ENCOUNTER — Telehealth: Payer: Self-pay | Admitting: Family

## 2021-09-20 NOTE — Telephone Encounter (Signed)
This RN spoke with pt today regarding situation of need for bx of noted abnormality on diagnostic mammogram on 09/09/2021 at [facility] in [place] with order placed last week to be done at the [facility] and need for release of records for the [facility] to schedule.  Katie Jacobson stated she is very frustrated due to course of events with her insurance and radiology facilities " since October "  She states she had her diagnostic mammogram scheduled at the [facility] on 08/06/2021- and was called to come in on the 27th due to openings- she went and then when there was told " they don't accept my insurance " and was set up at the [facility] radiology in [place].  She went there and noted procedure was not the same as previous mammograms - (not as long and not as many pictures)- but she left and then was called and informed she only had a screening not a diagnostic mammogram and due to concern needed a diagnostic-   She was then set up at [facility] in [facility] for the diagnostic- on 09/09/2021- which showed an area of abnormality and need for biopsy.  She was contacted by a Breast Navigator with [facility] and set up on 12/19 for the biopsy - but then was called and informed their facility was not covered under her insurance and now is being told by the Navigator she needs to return to [place].  Pt is willing to go where she needs to but wants to know that it is being covered by her insurance.  Post discussion of above- this RN attempted to contact her Insurance Carrier per number on her card at [phone] and received recording that stated they were closed.  Per MD review of above - request is for this office's Prior Auth dept and our Breast Navigators be informed for further follow up.  This note will be sent to above personnel.

## 2021-09-20 NOTE — Telephone Encounter (Signed)
Pt. Called and stated she hasnt heard anything back regarding the referral for her breast biopsy and she doesn't know who to contact to get further information.

## 2021-09-20 NOTE — Telephone Encounter (Signed)
Patient advised I spoke to [facility] and with Dr Virgie Dad CMA, Mate[place] Flow. Dr. Renita Papa CMA confirmed patient has been referred to have breast biopsy and there is nothing we need to do. She will call [facility] to see why patient has not been scheduled.  Patient was informed of referral status.

## 2021-09-20 NOTE — Telephone Encounter (Signed)
Spoke to Mrs. Stone and she reports patient needs a breast biopsy done and this was being handle by her oncologist

## 2021-09-22 ENCOUNTER — Other Ambulatory Visit: Payer: Self-pay | Admitting: *Deleted

## 2021-09-23 ENCOUNTER — Telehealth: Payer: Self-pay | Admitting: *Deleted

## 2021-09-23 ENCOUNTER — Encounter: Payer: Self-pay | Admitting: *Deleted

## 2021-09-23 ENCOUNTER — Other Ambulatory Visit: Payer: Self-pay | Admitting: Oncology

## 2021-09-23 ENCOUNTER — Other Ambulatory Visit: Payer: Self-pay | Admitting: *Deleted

## 2021-09-23 ENCOUNTER — Encounter: Payer: Self-pay | Admitting: Oncology

## 2021-09-23 NOTE — Telephone Encounter (Signed)
Enclosed is the communication per this RN in attempting to get pt scheduled for a biopsy  Laureen Abrahams, RN sent to [place], Gunnar Fusi, Paulette Blanch, RN; Rockwell Germany, RN; Magrinat, Virgie Dad, MD So I called [employer]- and [facility] in network-  I explained that this facility does not do the biopsies- but sends them to the [facility].   She stated need to submit online " request for authorization for out of network " and stated " it can take up to 30 days to get processed "   I explained the urgency of need with noted issue occurring in October and most recent image from 09/09/2021.   She stated that is the only option per her insurance.   There is an order in the system for this biopsy per Dr Jannifer Rodney.   Can we submit to her insurance carrier- and is there any other options ( per Navigator's or the Breast Center ) to assist getting this ASAP .   I appreciate any help with this situation which is really becoming an insurance fiasco!   Thank you Val RN        Previous Messages   ----- Message -----  From: Aundria Rud  Sent: 09/22/2021  10:25 AM EST  To: Laureen Abrahams, RN   The order comes from whomever wants to order it for her. The location comes from her payer. She needs to call them to find out who is in network. Give her the CPT and ICD 10 codes or let her navigator help.  Once they find out who is in network I can request it from her payer. Just let me know. Here to help girl.  It's only me to work every oncology and gyn oncology provider for the entire [place] [facility] so I stay busy. God help whomever comes behind me.   Thanks Val  ----- Message -----  From: Laureen Abrahams, RN  Sent: 09/22/2021  10:14 AM EST  To: Aundria Rud   So the issue is that we do not know what facility her insurance will allow her to have a breast biopsy- per their website and her call - they state the [facility] radiology in  [place]- but that facility does not do biopsies - it really has been a run around- I tried to call her insurance and was on hold too long and had to hang up.   We just need a biopsy of her breast and in [place] that is is done at the [facility]- but they state they are not in network.   This is really getting to be an issue of ethical treatment due to the delay and run around we are getting.   So I am just looking to see if you have any insight in how to get the order for her -   And I have enjoyed working with you- in this chaotic covid years you have always be attentive - thank you Val  ----- Message -----  From: Aundria Rud  Sent: 09/21/2021   5:16 PM EST  To: Laureen Abrahams, RN   Hey Val,   Saw the patient call for Ms. Lockner. What are you needing from me? If patient needs things scheduled she may want to reach out to her Health plan first and they can tell her who is in network before she is scheduled  and sent all over the place. Once this information has been found out I am able to obtain auth if it is ordered by one of pur [facility] providers at a facility that is in her network.   Not sure if that is what the message was about. Hope this helps.   P.S.: My last day is next week, It's been real girl. Glad to have crossed paths with you.   Lexine Baton

## 2021-09-24 ENCOUNTER — Telehealth: Payer: Self-pay | Admitting: Family

## 2021-09-24 NOTE — Telephone Encounter (Signed)
Pt called regarding issue with insurance regarding getting a biopsy and not finding a location within network. Patient stated she called her insurance and they informed her that we can submit information to her insurance that is requesting out of network approval and the reason as to why she needs the biopsy and they will submit it for approval for her to receive the biopsy at the selected location.  [employer]: [phone]

## 2021-09-24 NOTE — Telephone Encounter (Signed)
Called [employer] but had to hang up after waiting for 15 minutes.

## 2021-09-27 NOTE — Telephone Encounter (Signed)
Patient advised, no information from her insurance yet. The test referral has a note from 12-07 that says her test does not need prior authorization.

## 2021-10-28 ENCOUNTER — Other Ambulatory Visit: Payer: Self-pay | Admitting: Hematology and Oncology

## 2021-10-28 DIAGNOSIS — R921 Mammographic calcification found on diagnostic imaging of breast: Secondary | ICD-10-CM

## 2021-11-10 ENCOUNTER — Ambulatory Visit
Admission: RE | Admit: 2021-11-10 | Discharge: 2021-11-10 | Disposition: A | Discharge status: Home / Self Care | Payer: 59 | Source: Ambulatory Visit | Attending: Hematology and Oncology | Admitting: Hematology and Oncology

## 2021-11-10 DIAGNOSIS — R921 Mammographic calcification found on diagnostic imaging of breast: Secondary | ICD-10-CM

## 2021-11-15 ENCOUNTER — Inpatient Hospital Stay: Payer: 59 | Attending: Oncology | Admitting: Hematology and Oncology

## 2021-11-15 ENCOUNTER — Other Ambulatory Visit: Payer: Self-pay

## 2021-11-15 ENCOUNTER — Encounter: Payer: Self-pay | Admitting: Hematology and Oncology

## 2021-11-15 VITALS — BP 153/67 | HR 81 | Temp 97.9°F | Resp 18 | Ht 61.0 in | Wt 226.2 lb

## 2021-11-15 DIAGNOSIS — Z923 Personal history of irradiation: Secondary | ICD-10-CM | POA: Insufficient documentation

## 2021-11-15 DIAGNOSIS — Z882 Allergy status to sulfonamides status: Secondary | ICD-10-CM | POA: Diagnosis not present

## 2021-11-15 DIAGNOSIS — Z8249 Family history of ischemic heart disease and other diseases of the circulatory system: Secondary | ICD-10-CM | POA: Diagnosis not present

## 2021-11-15 DIAGNOSIS — I10 Essential (primary) hypertension: Secondary | ICD-10-CM | POA: Diagnosis not present

## 2021-11-15 DIAGNOSIS — Z9049 Acquired absence of other specified parts of digestive tract: Secondary | ICD-10-CM | POA: Insufficient documentation

## 2021-11-15 DIAGNOSIS — Z803 Family history of malignant neoplasm of breast: Secondary | ICD-10-CM | POA: Diagnosis not present

## 2021-11-15 DIAGNOSIS — Z9221 Personal history of antineoplastic chemotherapy: Secondary | ICD-10-CM | POA: Insufficient documentation

## 2021-11-15 DIAGNOSIS — Z171 Estrogen receptor negative status [ER-]: Secondary | ICD-10-CM | POA: Diagnosis not present

## 2021-11-15 DIAGNOSIS — C50411 Malignant neoplasm of upper-outer quadrant of right female breast: Secondary | ICD-10-CM | POA: Diagnosis not present

## 2021-11-15 DIAGNOSIS — Z79899 Other long term (current) drug therapy: Secondary | ICD-10-CM | POA: Insufficient documentation

## 2021-11-15 DIAGNOSIS — Z833 Family history of diabetes mellitus: Secondary | ICD-10-CM | POA: Diagnosis not present

## 2021-11-15 DIAGNOSIS — Z8371 Family history of colonic polyps: Secondary | ICD-10-CM | POA: Diagnosis not present

## 2021-11-15 DIAGNOSIS — Z818 Family history of other mental and behavioral disorders: Secondary | ICD-10-CM | POA: Diagnosis not present

## 2021-11-15 DIAGNOSIS — Z823 Family history of stroke: Secondary | ICD-10-CM | POA: Diagnosis not present

## 2021-11-15 DIAGNOSIS — Z87442 Personal history of urinary calculi: Secondary | ICD-10-CM | POA: Insufficient documentation

## 2021-11-15 DIAGNOSIS — Z841 Family history of disorders of kidney and ureter: Secondary | ICD-10-CM | POA: Diagnosis not present

## 2021-11-15 DIAGNOSIS — I341 Nonrheumatic mitral (valve) prolapse: Secondary | ICD-10-CM | POA: Diagnosis not present

## 2021-11-15 NOTE — Progress Notes (Signed)
[facility]  [phone] [phone]) [phone]     ID: Katie Jacobson DOB: [birth year]  MR#: [id number]  TRV#:[id number]  Patient Care Team: Debbrah Alar, NP as PCP - General (Internal Medicine) Leonie Man, MD as PCP - Cardiology (Cardiology) Rolm Bookbinder, MD as Consulting Physician (General Surgery) Eppie Gibson, MD as Attending Physician (Radiation Oncology) Armbruster, Carlota Raspberry, MD as Consulting Physician (Gastroenterology) Benay Pike, MD as Consulting Physician (Hematology and Oncology) OTHER MD:  CHIEF COMPLAINT: Triple negative breast cancer  CURRENT TREATMENT: observation   INTERVAL HISTORY: Katie Jacobson returns today for follow-up of her triple negative breast cancer. She continues on observation.   Since her last visit, she underwent bilateral screening mammography with tomography at [facility] on 08/11/2021 showing: breast density category B; further evaluation is suggested for calcifications in right breast.  She had delays in biopsy because of insurance reasons, she finally had her biopsy on 2.1.23 which showed  fibroadenomatoid nodules with dystrophic calcifications. She complains of tired ness, she describes a feeling in her right breast like a rubber band being stretched which causes breast discomfort intermittently. This happens when she turns wrong way, it resolves spontaneously. No change in breathing. Some foods cause diarrhea, no bleeding or black stool. No falls, headaches, double vision or seizure like activity.  REVIEW OF SYSTEMS:   COVID 19 VACCINATION STATUS: Status post vaccine x2   BREAST CANCER HISTORY: From the original intake note:  The patient herself palpated a mass in her right breast there is she brought it to her gynecologist, Dr. Alan Ripper attention and he obtained screening mammography which suggested an abnormality in the right breast. On 10/24/2014 the patient underwent right diagnostic  mammography and ultrasonography at the breast Center. The breast density was category B. There was an ill-defined mass in the upper outer quadrant of the right breast which was palpable on exam. By ultrasound this was irregular and hypoechoic measuring 1.2 cm. Evaluation of the right axilla was negative.  Biopsy of the mass in question showed an invasive ductal carcinoma, grade 2 or 3, triple negative, with an MIB-1 of 19%.  Katie Jacobson case was presented at the multidisciplinary breast cancer conference 11/19/2014, where was suggested she would benefit from genetics testing. Since this would mandate a significant delay in her final surgery, neoadjuvant chemotherapy was indicated.  The patient's subsequent history is as detailed below   PAST MEDICAL HISTORY: Past Medical History:  Diagnosis Date   Allergy    Breast cancer of upper-outer quadrant of right female breast ([place]) 11/14/2014   Treated with chemotherapy and radiation   History of chemotherapy    finished chemo 02/03/2015   History of kidney stones    History of seizures    as a child - unknown cause - states was never on anticonvulsants   Hypertension    states under control with meds., has been on med. x "years"   Mitral valve prolapse 1990   Follow-up echocardiogram not show mitral prolapse.  Shows mild mitral vegetation.   Osteoporosis    Personal history of chemotherapy    Personal history of radiation therapy    Seasonal allergies    Seizures ([place])    as a child-none as a adult    PAST SURGICAL HISTORY: Past Surgical History:  Procedure Laterality Date   ABDOMINAL HYSTERECTOMY  1998   partial   APPENDECTOMY  1976   BREAST LUMPECTOMY Right 02/2015   COLONOSCOPY  09/30/2016   KNEE ARTHROSCOPY Left  03/26/2008   PLANTAR'S WART EXCISION Left    x 2   PORT-A-CATH REMOVAL Left 04/06/2015   Procedure: REMOVAL PORT-A-CATH;  Surgeon: Rolm Bookbinder, MD;  Location: [facility];  Service: General;   Laterality: Left;   PORTACATH PLACEMENT N/A 11/27/2014   Procedure: INSERTION PORT-A-CATH;  Surgeon: Rolm Bookbinder, MD;  Location: [facility];  Service: General;  Laterality: N/A;   RADIOACTIVE SEED GUIDED PARTIAL MASTECTOMY WITH AXILLARY SENTINEL LYMPH NODE BIOPSY Right 03/02/2015   Procedure: RADIOACTIVE SEED GUIDED RIGHT PARTIAL MASTECTOMY WITH RIGHT AXILLARY SENTINEL LYMPH NODE BIOPSY;  Surgeon: Rolm Bookbinder, MD;  Location: [facility];  Service: General;  Laterality: Right;   SALIVARY STONE REMOVAL Right 01/09/1971   TONSILLECTOMY  1970s   TRANSTHORACIC ECHOCARDIOGRAM  01/16/2020   EF 60 to 65%.  GRII DD.  No R WMA.  Normal RV size and function.  Rheumatic mitral valve with moderate thickening-hockey-stick appearing.  Moderate MR with mild MS.  Moderate LA dilation.  (Recommend follow-up in 2 to 3 years)   TUBAL LIGATION  1996    FAMILY HISTORY Family History  Problem Relation Age of Onset   Heart disease Mother    Stroke Mother    Hypertension Mother    Diabetes Mother    Diabetes Sister    Kidney disease Brother    Mental retardation Brother    Diabetes Sister    Heart attack Father    Breast cancer Cousin        deceased 52   Colon polyps Daughter    Colon cancer Neg Hx    Esophageal cancer Neg Hx    Rectal cancer Neg Hx    Stomach cancer Neg Hx    the patient knows little about her father. Her mother died at at the age of 59 following a stroke. The patient had 2 brothers, 2 sisters. The only history of breast or ovarian cancer in the family is a first cousin on the mother's side diagnosed with breast cancer at age 38   GYNECOLOGIC HISTORY:  Patient's last menstrual period was 10/10/1996. Menarche age 42, first live birth age 48. The patient is GX P2. She status post simple hysterectomy without salpingo-oophorectomy. She did not take hormone replacement.   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 The patient's husband, Jori Moll "Ron" Shinn works for [employer] in the pattern shop. He also works as a Dispensing optician. Daughter Anneli Bing lives in [place] and works in Programmer, applications. Daughter [name] Ullman died in an [place] 2016-01-09.    ADVANCED DIRECTIVES: In the absence of any documentation to the contrary, the patient's spouse is their HCPOA.    HEALTH MAINTENANCE: Social History   Tobacco Use   Smoking status: Every Day    Packs/day: 0.50    Years: 43.00    Pack years: 21.50    Types: Cigarettes   Smokeless tobacco: Former    Quit date: 09/09/2016   Tobacco comments:    10 cig./day  Vaping Use   Vaping Use: Never used  Substance Use Topics   Alcohol use: Yes    Alcohol/week: 5.0 standard drinks    Types: 5 Standard drinks or equivalent per week    Comment: occ    Drug use: No     Colonoscopy: 11/2019, (Dr. Havery Moros), repeat due 09-Jan-2027  PAP:  Bone density:  Lipid panel:  Allergies  Allergen Reactions   Sulfa Antibiotics Other (  See Comments)    UNKNOWN    Current Outpatient Medications  Medication Sig Dispense Refill   albuterol (PROVENTIL HFA;VENTOLIN HFA) 108 (90 BASE) MCG/ACT inhaler Inhale into the lungs every 6 (six) hours as needed for wheezing or shortness of breath.     aspirin 81 MG tablet Take 1 tablet (81 mg total) by mouth daily. 30 tablet 0   chlorthalidone (HYGROTON) 25 MG tablet TAKE 1 TABLET(25 MG) BY MOUTH EVERY OTHER DAY 45 tablet 3   COVID-19 mRNA Vac-TriS, Pfizer, SUSP injection Inject into the muscle. 0.3 mL 0   enalapril (VASOTEC) 20 MG tablet TAKE 1 TABLET(20 MG) BY MOUTH AT BEDTIME 90 tablet 0   fluticasone (FLONASE) 50 MCG/ACT nasal spray Place 2 sprays into both nostrils as needed. 9.9 mL 5   influenza vac split quadrivalent PF (FLUARIX) 0.5 ML injection Inject into the muscle. 0.5 mL 0   metoprolol succinate (TOPROL-XL) 50 MG 24 hr tablet TAKE 1 TABLET BY MOUTH EVERY DAY 90 tablet 1   metoprolol tartrate (LOPRESSOR) 25 MG  tablet TAKE 1 TABLET BY MOUTH 2 TO 3 TIMES DAILY AS NEEDED FOR HEART FLUTTERING 30 tablet 6   No current facility-administered medications for this visit.    OBJECTIVE: African-American woman in no acute distress  Vitals:   11/15/21 1109  BP: (!) 153/67  Pulse: 81  Resp: 18  Temp: 97.9 F (36.6 C)  SpO2: 100%       Body mass index is 42.74 kg/m.    ECOG FS:1 - Symptomatic but completely ambulatory  Sclerae unicteric, EOMs intact Wearing a mask No cervical or supraclavicular adenopathy Lungs no rales or rhonchi Heart regular rate and rhythm Abd soft, obese, nontender, positive bowel sounds MSK no focal spinal tenderness, no upper extremity lymphedema Neuro: nonfocal, well oriented, appropriate affect Breasts: Both breasts examined, some fat necrosis noted especially at the superior margin of both breasts, no evidence of concerning masses. No regional adenopathy on exam today.   LAB RESULTS:  CMP     Component Value Date/Time   NA 142 08/16/2021 1418   NA 139 12/28/2016 1359   K 3.9 08/16/2021 1418   K 3.6 12/28/2016 1359   CL 107 08/16/2021 1418   CO2 26 08/16/2021 1418   CO2 25 12/28/2016 1359   GLUCOSE 81 08/16/2021 1418   GLUCOSE 85 12/28/2016 1359   BUN 13 08/16/2021 1418   BUN 15.6 12/28/2016 1359   CREATININE 0.84 08/16/2021 1418   CREATININE 0.93 08/10/2018 1507   CREATININE 0.9 12/28/2016 1359   CALCIUM 8.9 08/16/2021 1418   CALCIUM 9.7 12/28/2016 1359   PROT 7.0 08/16/2021 1418   PROT 7.5 12/28/2016 1359   ALBUMIN 3.4 (L) 08/16/2021 1418   ALBUMIN 3.7 12/28/2016 1359   AST 13 (L) 08/16/2021 1418   AST 14 12/28/2016 1359   ALT 13 08/16/2021 1418   ALT 17 12/28/2016 1359   ALKPHOS 74 08/16/2021 1418   ALKPHOS 86 12/28/2016 1359   BILITOT 0.4 08/16/2021 1418   BILITOT 0.35 12/28/2016 1359   GFRNONAA >60 08/16/2021 1418   GFRNONAA 70 09/02/2013 1128   GFRAA >60 12/28/2017 1342   GFRAA 81 09/02/2013 1128    INo results found for: SPEP,  UPEP  Lab Results  Component Value Date   WBC 6.8 08/16/2021   NEUTROABS 3.4 08/16/2021   HGB 13.4 08/16/2021   HCT 40.2 08/16/2021   MCV 94.6 08/16/2021   PLT 206 08/16/2021      Chemistry  Component Value Date/Time   NA 142 08/16/2021 1418   NA 139 12/28/2016 1359   K 3.9 08/16/2021 1418   K 3.6 12/28/2016 1359   CL 107 08/16/2021 1418   CO2 26 08/16/2021 1418   CO2 25 12/28/2016 1359   BUN 13 08/16/2021 1418   BUN 15.6 12/28/2016 1359   CREATININE 0.84 08/16/2021 1418   CREATININE 0.93 08/10/2018 1507   CREATININE 0.9 12/28/2016 1359      Component Value Date/Time   CALCIUM 8.9 08/16/2021 1418   CALCIUM 9.7 12/28/2016 1359   ALKPHOS 74 08/16/2021 1418   ALKPHOS 86 12/28/2016 1359   AST 13 (L) 08/16/2021 1418   AST 14 12/28/2016 1359   ALT 13 08/16/2021 1418   ALT 17 12/28/2016 1359   BILITOT 0.4 08/16/2021 1418   BILITOT 0.35 12/28/2016 1359       No results found for: LABCA2  No components found for: LABCA125  No results for input(s): INR in the last 168 hours.  Urinalysis    Component Value Date/Time   COLORURINE YELLOW 08/10/2018 1507   APPEARANCEUR TURBID (A) 08/10/2018 1507   LABSPEC 1.024 08/10/2018 1507   PHURINE 5.5 08/10/2018 1507   GLUCOSEU NEGATIVE 08/10/2018 1507   GLUCOSEU NEGATIVE 09/06/2016 1225   HGBUR 1+ (A) 08/10/2018 1507   BILIRUBINUR NEGATIVE 09/06/2016 1225   KETONESUR 1+ (A) 08/10/2018 1507   PROTEINUR NEGATIVE 08/10/2018 1507   UROBILINOGEN 0.2 09/06/2016 1225   NITRITE NEGATIVE 08/10/2018 1507   LEUKOCYTESUR NEGATIVE 08/10/2018 1507    STUDIES: MM CLIP PLACEMENT RIGHT  Result Date: 11/10/2021 CLINICAL DATA:  Status post right breast stereotactic biopsy. EXAM: 3D DIAGNOSTIC RIGHT MAMMOGRAM POST STEREOTACTIC BIOPSY COMPARISON:  Previous exam(s). FINDINGS: 3D Mammographic images were obtained following stereotactic guided biopsy of the right breast. The biopsy marking clip is in expected position at the site of biopsy.  IMPRESSION: Appropriate positioning of the coil shaped biopsy marking clip at the site of biopsy in the lower outer right breast. Final Assessment: Post Procedure Mammograms for Marker Placement Electronically Signed   By: Kristopher Oppenheim M.D.   On: 11/10/2021 11:50  MM RT BREAST BX W LOC DEV 1ST LESION IMAGE BX SPEC STEREO GUIDE  Addendum Date: 11/12/2021   ADDENDUM REPORT: 11/12/2021 07:59 ADDENDUM: Pathology revealed FIBROADENOMATOID NODULE WITH DYSTROPHIC CALCIFICATIONS- NO MALIGNANCY IDENTIFIED of the RIGHT breast, lower outer quadrant (coil clip). This was found to be concordant by Dr. Kristopher Oppenheim. Pathology results were discussed with the patient by telephone. The patient reported doing well after the biopsy with tenderness at the site. Post biopsy instructions and care were reviewed and questions were answered. The patient was encouraged to call [facility] for any additional concerns. The patient was instructed to return for annual screening mammography and informed a reminder notice would be sent regarding this appointment. Pathology results reported by Stacie Acres RN on 11/11/2021. Electronically Signed   By: Kristopher Oppenheim M.D.   On: 11/12/2021 07:59   Result Date: 11/12/2021 CLINICAL DATA:  66 year old female with indeterminate right breast calcifications. EXAM: RIGHT BREAST STEREOTACTIC CORE NEEDLE BIOPSY COMPARISON:  Previous exams. FINDINGS: The patient and I discussed the procedure of stereotactic-guided biopsy including benefits and alternatives. We discussed the high likelihood of a successful procedure. We discussed the risks of the procedure including infection, bleeding, tissue injury, clip migration, and inadequate sampling. Informed written consent was given. The usual time out protocol was performed immediately prior to the procedure. Using sterile technique  and 1% Lidocaine as local anesthetic, under stereotactic guidance, a 9 gauge vacuum assisted device was  used to perform core needle biopsy of calcifications in the lower outer quadrant of the right breast using a superior approach. Specimen radiograph was performed showing calcifications within the specimen. Specimens with calcifications are identified for pathology. Lesion quadrant: Lower outer quadrant At the conclusion of the procedure, a coil shaped tissue marker clip was deployed into the biopsy cavity. Follow-up 2-view mammogram was performed and dictated separately. IMPRESSION: Stereotactic-guided biopsy of the right breast. No apparent complications. Electronically Signed: By: Kristopher Oppenheim M.D. On: 11/10/2021 11:41    ASSESSMENT: 66 y.o. [place], [place] woman status post right breast upper outer quadrant biopsy 11/12/2014 for a clinical T1c N0, stage IA invasive ductal carcinoma, triple negative, with an MIB-1 of 19%  (1) neoadjuvant chemotherapy consisting of cyclophosphamide and docetaxel 4 given every 21 days with Neulasta support, first dose 12/02/2014, completed 02/03/2015  (2) genetics testing through the BreastNext gene panel at [facility] showed no deleterious mutations in ATM, BARD1, BRCA1, BRCA2, BRIP1, CDH1, CHEK2, MRE11A, MUTYH, NBN, NF1, PALB2, PTEN, RAD50, RAD51C, RAD51D, oe TP53.  (3) status post right lumpectomy and sentinel node sampling 03/02/2015 for a ypT1b ypN0 stage IA invasive ductal carcinoma, grade 1, with negative margins. Repeat HER-2 was again not amplified.  (4) radiation therapy completed August 2016  5. Mammogram  abnormal in Nov 2022, requiring additional biopsies, path neg for malignancy   PLAN: She is doing well today. No concerning ROS or PE findings. Repeat mammogram due in October, ordered. She understands she can be discharged from clinic, but she would like to follow up with [place] for breast exam RTC in 6 months. Total encounter time 30 minutes.    *Total Encounter Time as defined by the Centers for Medicare and Medicaid Services  includes, in addition to the face-to-face time of a patient visit (documented in the note above) non-face-to-face time: obtaining and reviewing outside history, ordering and reviewing medications, tests or procedures, care coordination (communications with other health care professionals or caregivers) and documentation in the medical record.

## 2021-12-05 ENCOUNTER — Other Ambulatory Visit: Payer: Self-pay | Admitting: Cardiology

## 2022-01-06 ENCOUNTER — Telehealth: Payer: Self-pay | Admitting: Pharmacist

## 2022-01-06 NOTE — Telephone Encounter (Signed)
Patient appearing on report for [place] Metric Hypertension Control due to last documented ambulatory blood pressure of 153/67 on 04/20/2021. Next appointment with PCP is no currently scheduled.   ? ? ?Current medications: enalapril 20 mg by mouth once daily, chlorthalidone 12.5 mg by mouth once daily, and metoprolol succinate 50 mg by mouth once daily ? ?Unable to reach patient LM on VM.  ? ? ?Cherre Robins, PharmD ?Clinical Pharmacist ?[facility] ?[facility] ? ? ?

## 2022-01-10 ENCOUNTER — Telehealth: Payer: Self-pay | Admitting: Pharmacist

## 2022-01-10 MED ORDER — FLUTICASONE PROPIONATE 50 MCG/ACT NA SUSP: 2.0000 | NASAL | 0 refills | Status: DC | PRN

## 2022-01-10 MED ORDER — ALBUTEROL SULFATE HFA 108 (90 BASE) MCG/ACT IN AERS: 1.0000 | INHALATION_SPRAY | Freq: Four times a day (QID) | RESPIRATORY_TRACT | 0 refills | Status: DC | PRN

## 2022-01-10 NOTE — Telephone Encounter (Signed)
Done

## 2022-01-10 NOTE — Telephone Encounter (Signed)
Patient appearing on report for [place] Metric Hypertension Control due to last documented ambulatory blood pressure of 153/67 on 04/20/2021. Next appointment with PCP is no current appointment scheduled  ? ?Outreached patient to discuss hypertension control and medication management.  ? ?Current medications: enalapril 20 mg by mouth once daily, chlorthalidone 25 mg by mouth once daily, and metoprolol succinate 50 mg by mouth once daily ? ?Patient does not have an automated upper arm home BP machine. ? ?Current blood pressure readings: not checking at home ? ?Patient denies hypotensive s/sx including dizziness, lightheadedness. Patient denies hypertensive symptoms including headache, chest pain, shortness of breath ? ?Patient endorses that she is limiting intake of sodium in diet.  ?She does continue to smoke - declines intervention at this time ? ?Assessment/Plan: ?- Currently uncontrolled ?- Reviewed goal blood pressure <140/90  ?- Reviewed appropriate administration of medication regimen  ?- Discussed dietary modifications, such as reduced salt intake, focus on whole grains, vegetables, lean proteins  ?- Collaborated with primary care provider office staff to ensure patient has scheduled follow up due to have yearly physical - forwarded request for appointment to scheduled.  ? ?Cherre Robins, PharmD ?Clinical Pharmacist ?[facility] ?[facility] ? ? ?

## 2022-01-24 ENCOUNTER — Telehealth: Payer: Self-pay | Admitting: Family

## 2022-01-24 NOTE — Telephone Encounter (Signed)
Called Katie Jacobson back but no answer, lvm for him to be aware forms have not been received.  ?

## 2022-01-24 NOTE — Telephone Encounter (Signed)
Shanon Brow from [place] states they are requiring a physician's statement for life insurance. States this was faxed over. They would like an update on the status of this.  ?

## 2022-01-26 NOTE — Telephone Encounter (Signed)
Spoke to Katie Jacobson and he reports we should be getting this forms in the "next couple of days" ?

## 2022-01-28 ENCOUNTER — Ambulatory Visit (INDEPENDENT_AMBULATORY_CARE_PROVIDER_SITE_OTHER): Payer: 59 | Admitting: Family

## 2022-01-28 ENCOUNTER — Encounter: Payer: Self-pay | Admitting: Family

## 2022-01-28 VITALS — BP 156/74 | HR 77 | Temp 98.7°F | Resp 16 | Ht 62.0 in | Wt 224.0 lb

## 2022-01-28 DIAGNOSIS — Z0000 Encounter for general adult medical examination without abnormal findings: Secondary | ICD-10-CM | POA: Diagnosis not present

## 2022-01-28 DIAGNOSIS — Z23 Encounter for immunization: Secondary | ICD-10-CM

## 2022-01-28 DIAGNOSIS — Z720 Tobacco use: Secondary | ICD-10-CM

## 2022-01-28 DIAGNOSIS — T6591XA Toxic effect of unspecified substance, accidental (unintentional), initial encounter: Secondary | ICD-10-CM | POA: Diagnosis not present

## 2022-01-28 DIAGNOSIS — I10 Essential (primary) hypertension: Secondary | ICD-10-CM | POA: Diagnosis not present

## 2022-01-28 MED ORDER — PREDNISONE 10 MG PO TABS: ORAL_TABLET | ORAL | 0 refills | Status: DC

## 2022-01-28 MED ORDER — METOPROLOL SUCCINATE ER 100 MG PO TB24
100.0000 mg | ORAL_TABLET | Freq: Every day | ORAL | 3 refills | Status: DC
Start: 2022-01-28 — End: 2022-05-19

## 2022-01-28 NOTE — Assessment & Plan Note (Addendum)
BP elevated.  On chlorthalidone, toprol. ? ?BP Readings from Last 3 Encounters:  ?01/28/22 (!) 156/74  ?11/15/21 (!) 153/67  ?08/16/21 (!) 161/76  ? ?Advised pt to increase metoprolol from '50mg'$  to '100mg'$  xl once daily.  ?

## 2022-01-28 NOTE — Progress Notes (Signed)
? ?Subjective:  ? ?By signing my name below, I, [name], attest that this documentation has been prepared under the direction and in the presence of Debbrah Alar NP, 01/28/2022  ? ? Patient ID: Katie Jacobson, female    DOB: Sep 07, 1956, 66 y.o.   MRN: [id number] ? ?Chief Complaint  ?Patient presents with  ?? Annual Exam  ?   ?  ? ? ?HPI ?Patient is in today for a comprehensive physical exam. ? ?Right Side Facial Swelling - She complains of right side facial swelling. She used a coloring treatment for her hair and noticed the symptoms soon after. She also states that her scalp hurts and is itchy. Bumps also developed around her face. She washed her hair on 01/27/2022 to help alleviate symptoms but the symptoms still persists.  ? ?Left Toe Callous - She complains of a callous under her left foot.  ? ?Blood Pressure - As of today's visit, her blood pressure is elevating. She is taking 50 MG of Metoprolol Succinate, 20 MG of Enalapril and 25 MG of Chlorthalidone.She does not take 25 MG of Metoprolol Tartrate.  ?BP Readings from Last 3 Encounters:  ?01/28/22 (!) 156/74  ?11/15/21 (!) 153/67  ?08/16/21 (!) 161/76  ? ?Pulse Readings from Last 3 Encounters:  ?01/28/22 77  ?11/15/21 81  ?08/16/21 75  ? ?Back Pain - She has back pain that radiates from her back to her knees.  ? ?Smoking - She smokes about 5 cigarettes a week.  ? ?She denies having any fever, ear pain, new muscle pain, joint pain,  new moles, congestion, sinus pain, sore throat, palpations, wheezing, n/v/d, constipation, blood in stool, dysuria, frequency, hematuria, headaches, depresssion or anxiety at this time ? ?Social History: She reports no recent surgeries. She denies of any changes to her family medical history ?Colonoscopy: Last completed on 11/11/2019 ?Dexa: Last completed on 09/08/2015 ?Mammogram: Last completed on 09/09/2021 ?Immunizations: She is not interested in HIV/HepC screening. She has not received the COVID - 19 Bivalent  vaccine. She is interested in receiving the tetanus vaccine. ?Diet: She is trying to eat healthier.  ?Exercise - She does not regularly exercise ?Dental: She is UTD on dental exams.  ?Vision: She is UTD on vision exams.  ? ?Health Maintenance Due  ?Topic Date Due  ?? COVID-19 Vaccine (5 - Booster for Pfizer series) 07/16/2021  ? ? ?Past Medical History:  ?Diagnosis Date  ?? Allergy   ?? Breast cancer of upper-outer quadrant of right female breast ([place]) 11/14/2014  ? Treated with chemotherapy and radiation  ?? History of chemotherapy   ? finished chemo 02/03/2015  ?? History of kidney stones   ?? History of seizures   ? as a child - unknown cause - states was never on anticonvulsants  ?? Hypertension   ? states under control with meds., has been on med. x "years"  ?? Mitral valve prolapse 1990  ? Follow-up echocardiogram not show mitral prolapse.  Shows mild mitral vegetation.  ?? Osteoporosis   ?? Personal history of chemotherapy   ?? Personal history of radiation therapy   ?? Seasonal allergies   ?? Seizures ([place])   ? as a child-none as a adult  ? ? ?Past Surgical History:  ?Procedure Laterality Date  ?? ABDOMINAL HYSTERECTOMY  1998  ? partial  ?? APPENDECTOMY  1976  ?? BREAST LUMPECTOMY Right 02/2015  ?? COLONOSCOPY  09/30/2016  ?? KNEE ARTHROSCOPY Left 03/26/2008  ?? PLANTAR'S WART EXCISION Left   ? x  2  ?? PORT-A-CATH REMOVAL Left 04/06/2015  ? Procedure: REMOVAL PORT-A-CATH;  Surgeon: Rolm Bookbinder, MD;  Location: [facility];  Service: General;  Laterality: Left;  ?? PORTACATH PLACEMENT N/A 11/27/2014  ? Procedure: INSERTION PORT-A-CATH;  Surgeon: Rolm Bookbinder, MD;  Location: [place] [facility];  Service: General;  Laterality: N/A;  ?? RADIOACTIVE SEED GUIDED PARTIAL MASTECTOMY WITH AXILLARY SENTINEL LYMPH NODE BIOPSY Right 03/02/2015  ? Procedure: RADIOACTIVE SEED GUIDED RIGHT PARTIAL MASTECTOMY WITH RIGHT AXILLARY SENTINEL LYMPH NODE BIOPSY;  Surgeon: Rolm Bookbinder, MD;  Location:  [place];  Service: General;  Laterality: Right;  ?? SALIVARY STONE REMOVAL Right 12-25-70  ?? TONSILLECTOMY  1970s  ?? TRANSTHORACIC ECHOCARDIOGRAM  01/16/2020  ? EF 60 to 65%.  GRII DD.  No R WMA.  Normal RV size and function.  Rheumatic mitral valve with moderate thickening-hockey-stick appearing.  Moderate MR with mild MS.  Moderate LA dilation.  (Recommend follow-up in 2 to 3 years)  ?? TUBAL LIGATION  1996  ? ? ?Family History  ?Problem Relation Age of Onset  ?? Heart disease Mother   ?? Stroke Mother   ?? Hypertension Mother   ?? Diabetes Mother   ?? Diabetes Sister   ?? Kidney disease Brother   ?? Mental retardation Brother   ?? Diabetes Sister   ?? Heart attack Father   ?? Breast cancer Cousin   ?     deceased 50  ?? Colon polyps Daughter   ?? Colon cancer Neg Hx   ?? Esophageal cancer Neg Hx   ?? Rectal cancer Neg Hx   ?? Stomach cancer Neg Hx   ? ? ?Social History  ? ?Socioeconomic History  ?? Marital status: Married  ?  Spouse name: Not on file  ?? Number of children: 2  ?? Years of education: Not on file  ?? Highest education level: Not on file  ?Occupational History  ?? Not on file  ?Tobacco Use  ?? Smoking status: Every Day  ?  Packs/day: 0.25  ?  Years: 43.00  ?  Pack years: 10.75  ?  Types: Cigarettes  ?? Smokeless tobacco: Former  ?  Quit date: 09/09/2016  ?? Tobacco comments:  ?  10 cig./day  ?Vaping Use  ?? Vaping Use: Never used  ?Substance and Sexual Activity  ?? Alcohol use: Yes  ?  Alcohol/week: 5.0 standard drinks  ?  Types: 5 Standard drinks or equivalent per week  ?  Comment: occ   ?? Drug use: No  ?? Sexual activity: Yes  ?  Partners: Male  ?Other Topics Concern  ?? Not on file  ?Social History Narrative  ? Married with 2 daughters- born 40 and 40 ([name] died in [place] 12/25/2015)  ? 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  Has been doing part time work with [employer] -- had 6 active clients, but the most important one is her  Daughter.  ? Regular exercise:  No  ? Caffeine Use: none; Current 1ppd smoker x > 39 yrs; EtOH ~4 oz/week  ? ?Social Determinants of Health  ? ?Financial Resource Strain: Not on file  ?Food Insecurity: Not on file  ?Transportation Needs: Not on file  ?Physical Activity: Not on file  ?Stress: Not on file  ?Social Connections: Not on file  ?Intimate Partner Violence: Not on file  ? ? ?Outpatient Medications Prior to Visit  ?Medication Sig Dispense Refill  ?? albuterol (VENTOLIN  HFA) 108 (90 Base) MCG/ACT inhaler Inhale 1-2 puffs into the lungs every 6 (six) hours as needed for wheezing or shortness of breath. 1 each 0  ?? aspirin 81 MG tablet Take 1 tablet (81 mg total) by mouth daily. 30 tablet 0  ?? chlorthalidone (HYGROTON) 25 MG tablet TAKE 1 TABLET(25 MG) BY MOUTH EVERY OTHER DAY 45 tablet 3  ?? enalapril (VASOTEC) 20 MG tablet TAKE 1 TABLET(20 MG) BY MOUTH AT BEDTIME 90 tablet 0  ?? fluticasone (FLONASE) 50 MCG/ACT nasal spray Place 2 sprays into both nostrils as needed. 16 g 0  ?? metoprolol tartrate (LOPRESSOR) 25 MG tablet TAKE 1 TABLET BY MOUTH 2 TO 3 TIMES DAILY AS NEEDED FOR HEART FLUTTERING 30 tablet 6  ?? metoprolol succinate (TOPROL-XL) 50 MG 24 hr tablet TAKE 1 TABLET BY MOUTH EVERY DAY 90 tablet 1  ? ?No facility-administered medications prior to visit.  ? ? ?Allergies  ?Allergen Reactions  ?? Sulfa Antibiotics Other (See Comments)  ?  UNKNOWN  ? ? ?Review of Systems  ?Constitutional:  Negative for fever.  ?HENT:  Negative for congestion, ear pain, sinus pain and sore throat.   ?     (+) Right Side Facial Swelling   ?Respiratory:  Negative for wheezing.   ?Cardiovascular:  Negative for palpitations.  ?Gastrointestinal:  Negative for blood in stool, constipation, diarrhea, nausea and vomiting.  ?Genitourinary:  Negative for dysuria, frequency and hematuria.  ?Musculoskeletal:  Positive for back pain. Negative for joint pain and myalgias.  ?Skin:  Positive for itching (Scalp).  ?     (+) Bumps Around  Face ?(-) [place]  ?Neurological:  Negative for headaches.  ?Psychiatric/Behavioral:  Negative for depression. The patient is not nervous/anxious.   ? ?   ?Objective:  ?  ?Physical Exam ?Constitutional:   ?   Ge

## 2022-01-28 NOTE — Patient Instructions (Signed)
Please increase toprol xl from 50mg to 100mg  

## 2022-01-28 NOTE — Assessment & Plan Note (Addendum)
Discussed healthy diet, exercise, weight loss. Colo/mammo up to date. Dental up to date, vision up to date. Td today.  ? ?Wt Readings from Last 3 Encounters:  ?01/28/22 224 lb (101.6 kg)  ?11/15/21 226 lb 3.2 oz (102.6 kg)  ?08/16/21 228 lb 1.6 oz (103.5 kg)  ? ? ?

## 2022-01-29 LAB — COMPREHENSIVE METABOLIC PANEL
AG Ratio: 1.4 (calc) (ref 1.0–2.5)
ALT: 14 U/L (ref 6–29)
AST: 13 U/L (ref 10–35)
Albumin: 3.8 g/dL (ref 3.6–5.1)
Alkaline phosphatase (APISO): 73 U/L (ref 37–153)
BUN: 14 mg/dL (ref 7–25)
CO2: 28 mmol/L (ref 20–32)
Calcium: 9.7 mg/dL (ref 8.6–10.4)
Chloride: 106 mmol/L (ref 98–110)
Creat: 0.9 mg/dL (ref 0.50–1.05)
Globulin: 2.8 g/dL (calc) (ref 1.9–3.7)
Glucose, Bld: 88 mg/dL (ref 65–99)
Potassium: 4.1 mmol/L (ref 3.5–5.3)
Sodium: 142 mmol/L (ref 135–146)
Total Bilirubin: 0.4 mg/dL (ref 0.2–1.2)
Total Protein: 6.6 g/dL (ref 6.1–8.1)

## 2022-01-30 DIAGNOSIS — T6591XA Toxic effect of unspecified substance, accidental (unintentional), initial encounter: Secondary | ICD-10-CM | POA: Insufficient documentation

## 2022-01-30 NOTE — Assessment & Plan Note (Signed)
She appears to have a local reaction (scalp) and right cheek to the chemicals used on her hair. She states that a chemical wave was used and a colorant.  Advised her to avoid future contact with these substances.  Due to c/o itching and swelling, will rx with a prednisone taper. She is advised to call if increased pain/swelling/redness.  ?

## 2022-01-30 NOTE — Assessment & Plan Note (Signed)
Urged complete cessation.  ?

## 2022-02-15 ENCOUNTER — Ambulatory Visit (INDEPENDENT_AMBULATORY_CARE_PROVIDER_SITE_OTHER): Payer: 59 | Admitting: Family

## 2022-02-15 DIAGNOSIS — I10 Essential (primary) hypertension: Secondary | ICD-10-CM | POA: Diagnosis not present

## 2022-02-15 NOTE — Patient Instructions (Signed)
Please take chlorthalidone once daily. ?Check blood pressure once daily for 1 week and then call me in your readings. Goal blood pressure is <140/90. Ideal is 120/80.  ?

## 2022-02-15 NOTE — Assessment & Plan Note (Addendum)
BP Readings from Last 3 Encounters:  ?02/15/22 (!) 148/62  ?01/28/22 (!) 156/74  ?11/15/21 (!) 153/67  ? ?BP is still a bit elevated, though it is improved. I suggested changing her medication but she declines at this time.  She is willing to try to increase the chlorthalidone to once daily. We discussed taking in the afternoon when she will be near home to use the restroom.  ?

## 2022-02-15 NOTE — Progress Notes (Signed)
? ?Subjective:  ? ?By signing my name below, I, Katie Jacobson, attest that this documentation has been prepared under the direction and in the presence of [place], NP 02/15/2022 ? ? Patient ID: Katie Jacobson, female    DOB: May 09, 1956, 65 y.o.   MRN: [id number] ? ?Chief Complaint  ?Patient presents with  ? Hypertension  ?  Here for follow up  ? ? ?HPI ?Patient is in today for an office visit. ? ?Rash - The rash on her scalp has been resolved.  ? ?Blood Pressure - She is taking 100 MG of Metoprolol Succinate. She reports that the medication is making her tired. As of today's visit, her blood pressure is decreasing. She states that taking 25 MG of [place] daily increases her urination. She states that she would urinate throughout the night. She takes every other day instead.  Notes that she feels sleepy on metoprolol but not bad enough to discontinue it.  ? ?BP Readings from Last 3 Encounters:  ?02/15/22 (!) 148/62  ?01/28/22 (!) 156/74  ?11/15/21 (!) 153/67  ? ?Pulse Readings from Last 3 Encounters:  ?02/15/22 74  ?01/28/22 77  ?11/15/21 81  ? ?Vitamins - She is inquiring about taking vitamin supplements. She is interested in starting prenatal vitamins.  ? ?Health Maintenance Due  ?Topic Date Due  ? COVID-19 Vaccine (5 - Booster for Pfizer series) 07/16/2021  ? ? ?Past Medical History:  ?Diagnosis Date  ? Allergy   ? Breast cancer of upper-outer quadrant of right female breast ([place]) 11/14/2014  ? Treated with chemotherapy and radiation  ? History of chemotherapy   ? finished chemo 02/03/2015  ? History of kidney stones   ? History of seizures   ? as a child - unknown cause - states was never on anticonvulsants  ? Hypertension   ? states under control with meds., has been on med. x "years"  ? Mitral valve prolapse 1990  ? Follow-up echocardiogram not show mitral prolapse.  Shows mild mitral vegetation.  ? Osteoporosis   ? Personal history of chemotherapy   ? Personal history of radiation therapy   ?  Seasonal allergies   ? Seizures ([place])   ? as a child-none as a adult  ? ? ?Past Surgical History:  ?Procedure Laterality Date  ? ABDOMINAL HYSTERECTOMY  1998  ? partial  ? APPENDECTOMY  1976  ? BREAST LUMPECTOMY Right 02/2015  ? COLONOSCOPY  09/30/2016  ? KNEE ARTHROSCOPY Left 03/26/2008  ? PLANTAR'S WART EXCISION Left   ? x 2  ? PORT-A-CATH REMOVAL Left 04/06/2015  ? Procedure: REMOVAL PORT-A-CATH;  Surgeon: Rolm Bookbinder, MD;  Location: [facility];  Service: General;  Laterality: Left;  ? PORTACATH PLACEMENT N/A 11/27/2014  ? Procedure: INSERTION PORT-A-CATH;  Surgeon: Rolm Bookbinder, MD;  Location: [facility];  Service: General;  Laterality: N/A;  ? RADIOACTIVE SEED GUIDED PARTIAL MASTECTOMY WITH AXILLARY SENTINEL LYMPH NODE BIOPSY Right 03/02/2015  ? Procedure: RADIOACTIVE SEED GUIDED RIGHT PARTIAL MASTECTOMY WITH RIGHT AXILLARY SENTINEL LYMPH NODE BIOPSY;  Surgeon: Rolm Bookbinder, MD;  Location: [facility];  Service: General;  Laterality: Right;  ? SALIVARY STONE REMOVAL Right 1972  ? TONSILLECTOMY  1970s  ? TRANSTHORACIC ECHOCARDIOGRAM  01/16/2020  ? EF 60 to 65%.  GRII DD.  No R WMA.  Normal RV size and function.  Rheumatic mitral valve with moderate thickening-hockey-stick appearing.  Moderate MR with mild MS.  Moderate LA dilation.  (Recommend follow-up in 2  to 3 years)  ? TUBAL LIGATION  1996  ? ? ?Family History  ?Problem Relation Age of Onset  ? Heart disease Mother   ? Stroke Mother   ? Hypertension Mother   ? Diabetes Mother   ? Diabetes Sister   ? Kidney disease Brother   ? Mental retardation Brother   ? Diabetes Sister   ? Heart attack Father   ? Breast cancer Cousin   ?     deceased 39  ? Colon polyps Daughter   ? Colon cancer Neg Hx   ? Esophageal cancer Neg Hx   ? Rectal cancer Neg Hx   ? Stomach cancer Neg Hx   ? ? ?Social History  ? ?Socioeconomic History  ? Marital status: Married  ?  Spouse name: Not on file  ? Number of children: 2  ? Years of  education: Not on file  ? Highest education level: Not on file  ?Occupational History  ? Not on file  ?Tobacco Use  ? Smoking status: Every Day  ?  Packs/day: 0.25  ?  Years: 43.00  ?  Pack years: 10.75  ?  Types: Cigarettes  ? Smokeless tobacco: Former  ?  Quit date: 09/09/2016  ? Tobacco comments:  ?  10 cig./day  ?Vaping Use  ? Vaping Use: Never used  ?Substance and Sexual Activity  ? Alcohol use: Yes  ?  Alcohol/week: 5.0 standard drinks  ?  Types: 5 Standard drinks or equivalent per week  ?  Comment: occ   ? Drug use: No  ? Sexual activity: Yes  ?  Partners: Male  ?Other Topics Concern  ? Not on file  ?Social History Narrative  ? Married with 2 daughters- born 71 and 28 ([name] died in [place] Jan 22, 2016)  ? 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  Has been doing part time work with [employer] -- had 6 active clients, but the most important one is her Daughter.  ? Regular exercise:  No  ? Caffeine Use: none; Current 1ppd smoker x > 39 yrs; EtOH ~4 oz/week  ? ?Social Determinants of Health  ? ?Financial Resource Strain: Not on file  ?Food Insecurity: Not on file  ?Transportation Needs: Not on file  ?Physical Activity: Not on file  ?Stress: Not on file  ?Social Connections: Not on file  ?Intimate Partner Violence: Not on file  ? ? ?Outpatient Medications Prior to Visit  ?Medication Sig Dispense Refill  ? albuterol (VENTOLIN HFA) 108 (90 Base) MCG/ACT inhaler Inhale 1-2 puffs into the lungs every 6 (six) hours as needed for wheezing or shortness of breath. 1 each 0  ? aspirin 81 MG tablet Take 1 tablet (81 mg total) by mouth daily. 30 tablet 0  ? chlorthalidone (HYGROTON) 25 MG tablet TAKE 1 TABLET(25 MG) BY MOUTH EVERY OTHER DAY 45 tablet 3  ? enalapril (VASOTEC) 20 MG tablet TAKE 1 TABLET(20 MG) BY MOUTH AT BEDTIME 90 tablet 0  ? fluticasone (FLONASE) 50 MCG/ACT nasal spray Place 2 sprays into both nostrils as needed. 16 g 0  ? metoprolol succinate (TOPROL-XL) 100 MG 24 hr  tablet Take 1 tablet (100 mg total) by mouth daily. Take with or immediately following a meal. 30 tablet 3  ? metoprolol tartrate (LOPRESSOR) 25 MG tablet TAKE 1 TABLET BY MOUTH 2 TO 3 TIMES DAILY AS NEEDED FOR HEART FLUTTERING 30 tablet 6  ? predniSONE (DELTASONE) 10 MG tablet 4 tabs  by mouth once daily for 2 days, then 3 tabs daily x 2 days, then 2 tabs daily x 2 days, then 1 tab daily x 2 days 20 tablet 0  ? ?No facility-administered medications prior to visit.  ? ? ?Allergies  ?Allergen Reactions  ? Sulfa Antibiotics Other (See Comments)  ?  UNKNOWN  ? ? ?ROS ?See HPI ?   ?Objective:  ?  ?Physical Exam ?Constitutional:   ?   General: She is not in acute distress. ?   Appearance: Normal appearance. She is not ill-appearing.  ?HENT:  ?   Head: Normocephalic and atraumatic.  ?   Right Ear: External ear normal.  ?   Left Ear: External ear normal.  ?Eyes:  ?   Extraocular Movements: Extraocular movements intact.  ?   Pupils: Pupils are equal, round, and reactive to light.  ?Cardiovascular:  ?   Rate and Rhythm: Normal rate and regular rhythm.  ?   Heart sounds: Normal heart sounds. No murmur heard. ?  No gallop.  ?Pulmonary:  ?   Effort: Pulmonary effort is normal. No respiratory distress.  ?   Breath sounds: Normal breath sounds. No wheezing or rales.  ?Skin: ?   General: Skin is warm and dry.  ?Neurological:  ?   Mental Status: She is alert and oriented to person, place, and time.  ?Psychiatric:     ?   Mood and Affect: Mood normal.     ?   Behavior: Behavior normal.     ?   Judgment: Judgment normal.  ? ? ?BP (!) 148/62   Pulse 74   Temp 98.7 ?F (37.1 ?C) (Oral)   Resp 16   Wt 227 lb (103 kg)   LMP 10/10/1996   SpO2 100%   BMI 41.52 kg/m?  ?Wt Readings from Last 3 Encounters:  ?02/15/22 227 lb (103 kg)  ?01/28/22 224 lb (101.6 kg)  ?11/15/21 226 lb 3.2 oz (102.6 kg)  ? ? ?   ?Assessment & Plan:  ? ?Problem List Items Addressed This Visit   ? ?  ? Unprioritized  ? Essential hypertension (Chronic)  ?  BP  Readings from Last 3 Encounters:  ?02/15/22 (!) 148/62  ?01/28/22 (!) 156/74  ?11/15/21 (!) 153/67  ?BP is still a bit elevated, though it is improved. I suggested changing her medication but she declines at [facility]

## 2022-02-16 ENCOUNTER — Telehealth: Payer: Self-pay | Admitting: Family

## 2022-02-16 NOTE — Telephone Encounter (Signed)
Pt would like to know when she had surgery to remove her breat cancer. She stated she may call the cancer center to see and medical records number provided as well.  ?

## 2022-02-16 NOTE — Telephone Encounter (Signed)
Patient needed date of previous surgery for breast cancer, patient advised this was on 04-06-2015 ?

## 2022-03-05 ENCOUNTER — Other Ambulatory Visit: Payer: Self-pay | Admitting: Cardiology

## 2022-03-22 ENCOUNTER — Telehealth: Payer: Self-pay | Admitting: Pharmacist

## 2022-03-22 NOTE — Telephone Encounter (Signed)
Patient appearing on report for [facility] - Hypertension Control report due to last documented ambulatory blood pressure of 148/62 on 02/15/2022. Next appointment with PCP is not currently scheduled   Outreached patient to discuss hypertension control and medication management.   Current antihypertensives: metoprolol succinate ER '100mg'$  daily; enalparil '20mg'$  at bedtime; chlorthalidone '25mg'$  every OTHER day (however patient reports she is taking daily except none on Saturdays or Sundays.   Patient has an automated upper arm home BP machine. Current blood pressure readings: 127/71; 124/68; 151/81; 119/62; 118/63; 117/65   Patient denies hypotensive signs and symptoms including no dizziness, lightheadedness.  Patient denies hypertensive symptoms including no headache, chest pain, shortness of breath.  Patient reports side effects related to metoprolol - states she feels fatigued; also does not like that chlorthalidone causes frequent urination.    Assessment/Plan: - Currently controlled based on home blood pressure readings but office blood pressure has been above goal. - Reviewed goal blood pressure <140/90 - Reviewed appropriate administration of medication regimen - Reviewed appropriate home BP monitoring technique (avoid caffeine, smoking, and exercise for 30 minutes before checking, rest for at least 5 minutes before taking BP, sit with feet flat on the floor and back against a hard surface, uncross legs, and rest arm on flat surface) - Reviewed to check blood pressure 3 to 4 times per week, document, and provide at next provider visit - Possible alternative medications discussed. Could try amlodipine '5mg'$  daily and lower dose of metoprolol to '50mg'$  daily to see if fatigue improves. Also could try long acting ACE / ARB  like valsartan '160mg'$  daily to see if improves blood pressure. Patient declined any medication changes at this time.   Cherre Robins, PharmD Clinical Pharmacist [facility]

## 2022-04-11 ENCOUNTER — Telehealth: Payer: Self-pay | Admitting: Pharmacist

## 2022-04-11 NOTE — Telephone Encounter (Signed)
Contacted [facility] and Rx for metoprolol ER '50mg'$  was discontinued.

## 2022-04-11 NOTE — Telephone Encounter (Signed)
Spoke with patient. Discussed that both metoprolol ER'100mg'$  for #30 on 03/05/2022 and metoprolol ER '50mg'$  #90 = 90 days on 03/08/2022 were filled. She did not realize that she had 2 different strengths but states she has been taking metoprolol ER '100mg'$  daily.  Patient is aware that she had 2 different strengths. She will take 2 tablets of '50mg'$  metoprolol ER until finishes current prescription and then will switch to metoprolol ER '100mg'$  and take 1 tablet daily. Home blood pressure has been at goal SBP 111 to 128 and DBP 62 to 72. She has 1 reading 148/75.  Reviewed blood pressure goals and importance of keeping blood pressure at goal for heart and kidney health.  Patient states she is due to follow up with PCP. Offered to make follow up appointment but patient declined. She states she wants to get her insurance fixed first. She has [employer] and they will no longer offer service in [facility] after August 31st. Patient is really concerned because she received a $3000 bill from oncology office.

## 2022-04-11 NOTE — Telephone Encounter (Signed)
Patient appearing on report for [employer] [facility] - Hypertension Control report due to last documented ambulatory blood pressure of 148/62 on 02/15/2022. Next appointment with PCP has not been scheduled.   Patient is participating in a Managed Medicaid Plan:  Yes - [employer]  Outreached patient to discuss hypertension control and medication management.   Current antihypertensives:  Chlorthalidone '25mg'$  daily; enalapril '20mg'$  at bedtime and metoprolol ER '100mg'$  daily.   I was unable to reach patient today to follow on home blood pressure readings. I did review her refill history and noted that she has filled metoprolol ER '100mg'$  for #30 on 03/05/2022 and then metoprolol ER '50mg'$  #90 = 90 days on 03/08/2022. Called her pharmacy [facility] and verified that patient did pick up both of these prescriptions.   Will continue to try to reach patient to make sure she is taking '100mg'$  per day of metoprolol.   Patient does not have an automated upper arm home BP machine.  Cherre Robins, PharmD Clinical Pharmacist  [facility]

## 2022-04-29 ENCOUNTER — Ambulatory Visit (INDEPENDENT_AMBULATORY_CARE_PROVIDER_SITE_OTHER): Payer: 59 | Admitting: Family

## 2022-04-29 DIAGNOSIS — I10 Essential (primary) hypertension: Secondary | ICD-10-CM

## 2022-04-29 NOTE — Progress Notes (Signed)
Subjective:   By signing my name below, I, [name], attest that this documentation has been prepared under the direction and in the presence of Karie Chimera, NP 04/29/2022     Patient ID: Katie Jacobson, female    DOB: [birth year], 66 y.o.   MRN: [id number]  Chief Complaint  Patient presents with   Hypertension    Here for follow up    HPI Patient is in today for an office visit.  Blood Pressure - She regularly checks her blood pressure and states that her blood pressure levels are usually lower than the reported one during today's visit. She reports that her systolic is about 694 and her diastolic ranges from 63 - 67. She is currently taking 25 Mg of Chlorthalidone, 20 Mg of Enalapril and 100 Mg of Metoprolol Succinate. As of today's visit, her blood pressure reading is 138/60.  BP Readings from Last 3 Encounters:  04/29/22 136/60  02/15/22 (!) 148/62  01/28/22 (!) 156/74   Pulse Readings from Last 3 Encounters:  04/29/22 74  02/15/22 74  01/28/22 77   She is regularly Enalapril in the morning. She does not take the medication during the weekend. She is experiencing incontinence while on the medication and states that she experienced incontinence about three times on one day. She is not interested in wearing a pad to help alleviate symptoms. She reports that she has not had swelling prior to being prescribed the medication.   Health Maintenance Due  Topic Date Due   COVID-19 Vaccine (5 - Booster for Pfizer series) 07/16/2021    Past Medical History:  Diagnosis Date   Allergy    Breast cancer of upper-outer quadrant of right female breast ([facility]) 11/14/2014   Treated with chemotherapy and radiation   History of chemotherapy    finished chemo 02/03/2015   History of kidney stones    History of seizures    as a child - unknown cause - states was never on anticonvulsants   Hypertension    states under control with meds., has been on med. x "years"   Mitral  valve prolapse 1990   Follow-up echocardiogram not show mitral prolapse.  Shows mild mitral vegetation.   Osteoporosis    Personal history of chemotherapy    Personal history of radiation therapy    Seasonal allergies    Seizures ([place])    as a child-none as a adult    Past Surgical History:  Procedure Laterality Date   ABDOMINAL HYSTERECTOMY  1998   partial   APPENDECTOMY  1976   BREAST LUMPECTOMY Right 02/2015   COLONOSCOPY  09/30/2016   KNEE ARTHROSCOPY Left 03/26/2008   PLANTAR'S WART EXCISION Left    x 2   PORT-A-CATH REMOVAL Left 04/06/2015   Procedure: REMOVAL PORT-A-CATH;  Surgeon: Rolm Bookbinder, MD;  Location: [facility];  Service: General;  Laterality: Left;   PORTACATH PLACEMENT N/A 11/27/2014   Procedure: INSERTION PORT-A-CATH;  Surgeon: Rolm Bookbinder, MD;  Location: [facility];  Service: General;  Laterality: N/A;   RADIOACTIVE SEED GUIDED PARTIAL MASTECTOMY WITH AXILLARY SENTINEL LYMPH NODE BIOPSY Right 03/02/2015   Procedure: RADIOACTIVE SEED GUIDED RIGHT PARTIAL MASTECTOMY WITH RIGHT AXILLARY SENTINEL LYMPH NODE BIOPSY;  Surgeon: Rolm Bookbinder, MD;  Location: [facility];  Service: General;  Laterality: Right;   SALIVARY STONE REMOVAL Right 1972   TONSILLECTOMY  1970s   TRANSTHORACIC ECHOCARDIOGRAM  01/16/2020   EF 60 to 65%.  GRII DD.  No R WMA.  Normal RV size and function.  Rheumatic mitral valve with moderate thickening-hockey-stick appearing.  Moderate MR with mild MS.  Moderate LA dilation.  (Recommend follow-up in 2 to 3 years)   TUBAL LIGATION  1996    Family History  Problem Relation Age of Onset   Heart disease Mother    Stroke Mother    Hypertension Mother    Diabetes Mother    Diabetes Sister    Kidney disease Brother    Mental retardation Brother    Diabetes Sister    Heart attack Father    Breast cancer Cousin        deceased 24   Colon polyps Daughter    Colon cancer Neg Hx    Esophageal  cancer Neg Hx    Rectal cancer Neg Hx    Stomach cancer Neg Hx     Social History   Socioeconomic History   Marital status: Married    Spouse name: Not on file   Number of children: 2   Years of education: Not on file   Highest education level: Not on file  Occupational History   Not on file  Tobacco Use   Smoking status: Every Day    Packs/day: 0.25    Years: 43.00    Total pack years: 10.75    Types: Cigarettes   Smokeless tobacco: Former    Quit date: 09/09/2016   Tobacco comments:    10 cig./day  Vaping Use   Vaping Use: Never used  Substance and Sexual Activity   Alcohol use: Yes    Alcohol/week: 5.0 standard drinks of alcohol    Types: 5 Standard drinks or equivalent per week    Comment: occ    Drug use: No   Sexual activity: Yes    Partners: Male  Other Topics Concern   Not on file  Social History Narrative   Married with 2 daughters- born 55 and 47 ([name] died in [place] December 23, 2015)   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  Has been doing part time work with [employer] -- had 6 active clients, but the most important one is her Daughter.   Regular exercise:  No   Caffeine Use: none; Current 1ppd smoker x > 39 yrs; EtOH ~4 oz/week   Social Determinants of Health   Financial Resource Strain: Not on file  Food Insecurity: Not on file  Transportation Needs: Not on file  Physical Activity: Not on file  Stress: Not on file  Social Connections: Not on file  Intimate Partner Violence: Not on file    Outpatient Medications Prior to Visit  Medication Sig Dispense Refill   albuterol (VENTOLIN HFA) 108 (90 Base) MCG/ACT inhaler Inhale 1-2 puffs into the lungs every 6 (six) hours as needed for wheezing or shortness of breath. 1 each 0   aspirin 81 MG tablet Take 1 tablet (81 mg total) by mouth daily. 30 tablet 0   chlorthalidone (HYGROTON) 25 MG tablet TAKE 1 TABLET(25 MG) BY MOUTH EVERY OTHER DAY 45 tablet 3   enalapril  (VASOTEC) 20 MG tablet TAKE 1 TABLET(20 MG) BY MOUTH AT BEDTIME 90 tablet 2   fluticasone (FLONASE) 50 MCG/ACT nasal spray Place 2 sprays into both nostrils as needed. 16 g 0   metoprolol succinate (TOPROL-XL) 100 MG 24 hr tablet Take 1 tablet (100 mg total) by mouth daily. Take with or immediately following a meal. 30 tablet 3  No facility-administered medications prior to visit.   20 minutes spent on today's visit. The majority of the time was spent counseling patient on hypertension.  Allergies  Allergen Reactions   Sulfa Antibiotics Other (See Comments)    UNKNOWN    ROS    See HPI Objective:    Physical Exam Constitutional:      General: She is not in acute distress.    Appearance: Normal appearance. She is not ill-appearing.  HENT:     Head: Normocephalic and atraumatic.     Right Ear: External ear normal.     Left Ear: External ear normal.  Eyes:     Extraocular Movements: Extraocular movements intact.     Pupils: Pupils are equal, round, and reactive to light.  Cardiovascular:     Rate and Rhythm: Normal rate and regular rhythm.     Heart sounds: Normal heart sounds. No murmur heard.    No gallop.  Pulmonary:     Effort: Pulmonary effort is normal. No respiratory distress.     Breath sounds: Normal breath sounds. No wheezing or rales.  Skin:    General: Skin is warm and dry.  Neurological:     Mental Status: She is alert and oriented to person, place, and time.  Psychiatric:        Mood and Affect: Mood normal.        Behavior: Behavior normal.        Judgment: Judgment normal.     BP 136/60   Pulse 74   Temp 98.9 F (37.2 C) (Oral)   Resp 16   Wt 223 lb (101.2 kg)   LMP 10/10/1996   SpO2 100%   BMI 40.79 kg/m  Wt Readings from Last 3 Encounters:  04/29/22 223 lb (101.2 kg)  02/15/22 227 lb (103 kg)  01/28/22 224 lb (101.6 kg)       Assessment & Plan:   Problem List Items Addressed This Visit       Unprioritized   Essential hypertension  (Chronic)    BP Readings from Last 3 Encounters:  04/29/22 136/60  02/15/22 (!) 148/62  01/28/22 (!) 156/74  Initial bp reading was mildly elevated but follow up is at goal. She is bothered by the urinary frequency and some urinary incontinence which is induced by the chlorthalidone. I encouraged her to speak with her cardiologist about this since he is the prescriber.         No orders of the defined types were placed in this encounter.   I, Nance Pear, NP, personally preformed the services described in this documentation.  All medical record entries made by the scribe were at my direction and in my presence.  I have reviewed the chart and discharge instructions (if applicable) and agree that the record reflects my personal performance and is accurate and complete. 04/29/2022  I,Amber Collins,acting as a Education administrator for Nance Pear, NP.,have documented all relevant documentation on the behalf of Nance Pear, NP,as directed by  Nance Pear, NP while in the presence of Nance Pear, NP.   Nance Pear, NP

## 2022-04-29 NOTE — Assessment & Plan Note (Addendum)
BP Readings from Last 3 Encounters:  04/29/22 136/60  02/15/22 (!) 148/62  01/28/22 (!) 156/74   Initial bp reading was mildly elevated but follow up is at goal. She is bothered by the urinary frequency and some urinary incontinence which is induced by the chlorthalidone. I encouraged her to speak with her cardiologist about this since he is the prescriber. Continue chlorthalidone, enalapril and metoprolol.

## 2022-05-04 ENCOUNTER — Telehealth: Payer: 59 | Admitting: Cardiology

## 2022-05-04 NOTE — Telephone Encounter (Signed)
Patient stated her health insurance company [employer] is going out of business on 06/09/22. Her [employer] insurance witll begin 05/10/22. She is trying to decide which insurance to use and will contact clinic when she makes her decision so we can order the echo and have it scheduled.

## 2022-05-04 NOTE — Telephone Encounter (Signed)
Patient called stating she is having issues with her health insurance may need to delay having the Echo.  Patient to check with billing to see her options.

## 2022-05-16 ENCOUNTER — Other Ambulatory Visit: Payer: Self-pay

## 2022-05-16 ENCOUNTER — Encounter: Payer: Self-pay | Admitting: Hematology and Oncology

## 2022-05-16 ENCOUNTER — Inpatient Hospital Stay: Payer: 59 | Attending: Hematology and Oncology | Admitting: Hematology and Oncology

## 2022-05-16 VITALS — BP 138/78 | HR 74 | Temp 97.9°F | Resp 16 | Ht 62.0 in | Wt 225.1 lb

## 2022-05-16 DIAGNOSIS — Z8249 Family history of ischemic heart disease and other diseases of the circulatory system: Secondary | ICD-10-CM | POA: Diagnosis not present

## 2022-05-16 DIAGNOSIS — C50411 Malignant neoplasm of upper-outer quadrant of right female breast: Secondary | ICD-10-CM

## 2022-05-16 DIAGNOSIS — Z8371 Family history of colonic polyps: Secondary | ICD-10-CM | POA: Insufficient documentation

## 2022-05-16 DIAGNOSIS — F17210 Nicotine dependence, cigarettes, uncomplicated: Secondary | ICD-10-CM | POA: Insufficient documentation

## 2022-05-16 DIAGNOSIS — N6489 Other specified disorders of breast: Secondary | ICD-10-CM | POA: Diagnosis not present

## 2022-05-16 DIAGNOSIS — Z9049 Acquired absence of other specified parts of digestive tract: Secondary | ICD-10-CM | POA: Diagnosis not present

## 2022-05-16 DIAGNOSIS — I10 Essential (primary) hypertension: Secondary | ICD-10-CM | POA: Insufficient documentation

## 2022-05-16 DIAGNOSIS — Z171 Estrogen receptor negative status [ER-]: Secondary | ICD-10-CM | POA: Diagnosis not present

## 2022-05-16 DIAGNOSIS — Z841 Family history of disorders of kidney and ureter: Secondary | ICD-10-CM | POA: Diagnosis not present

## 2022-05-16 DIAGNOSIS — Z882 Allergy status to sulfonamides status: Secondary | ICD-10-CM | POA: Insufficient documentation

## 2022-05-16 DIAGNOSIS — Z87442 Personal history of urinary calculi: Secondary | ICD-10-CM | POA: Insufficient documentation

## 2022-05-16 DIAGNOSIS — Z7289 Other problems related to lifestyle: Secondary | ICD-10-CM | POA: Insufficient documentation

## 2022-05-16 DIAGNOSIS — I341 Nonrheumatic mitral (valve) prolapse: Secondary | ICD-10-CM | POA: Diagnosis not present

## 2022-05-16 DIAGNOSIS — Z823 Family history of stroke: Secondary | ICD-10-CM | POA: Insufficient documentation

## 2022-05-16 DIAGNOSIS — Z79899 Other long term (current) drug therapy: Secondary | ICD-10-CM | POA: Insufficient documentation

## 2022-05-16 DIAGNOSIS — Z9221 Personal history of antineoplastic chemotherapy: Secondary | ICD-10-CM | POA: Insufficient documentation

## 2022-05-16 DIAGNOSIS — Z818 Family history of other mental and behavioral disorders: Secondary | ICD-10-CM | POA: Insufficient documentation

## 2022-05-16 DIAGNOSIS — Z833 Family history of diabetes mellitus: Secondary | ICD-10-CM | POA: Insufficient documentation

## 2022-05-16 DIAGNOSIS — R5382 Chronic fatigue, unspecified: Secondary | ICD-10-CM | POA: Insufficient documentation

## 2022-05-16 DIAGNOSIS — Z803 Family history of malignant neoplasm of breast: Secondary | ICD-10-CM | POA: Insufficient documentation

## 2022-05-16 DIAGNOSIS — Z923 Personal history of irradiation: Secondary | ICD-10-CM | POA: Insufficient documentation

## 2022-05-16 DIAGNOSIS — R69 Illness, unspecified: Secondary | ICD-10-CM | POA: Diagnosis not present

## 2022-05-16 NOTE — Progress Notes (Signed)
[facility]  [phone] [phone]) [phone]     ID: Katie Jacobson DOB: 08-Jun-1956  MR#: [id number]  FFM#:[id number]  Patient Care Team: Debbrah Alar, NP as PCP - General (Internal Medicine) Leonie Man, MD as PCP - Cardiology (Cardiology) Rolm Bookbinder, MD as Consulting Physician (General Surgery) Eppie Gibson, MD as Attending Physician (Radiation Oncology) Armbruster, Carlota Raspberry, MD as Consulting Physician (Gastroenterology) Benay Pike, MD as Consulting Physician (Hematology and Oncology)  CHIEF COMPLAINT: Triple negative breast cancer  CURRENT TREATMENT: observation  INTERVAL HISTORY:  Katie Jacobson returns today for follow-up of her triple negative breast cancer. She continues on observation.  Most recent mammogram was February, biopsy of right breast, benign changes. She complains of ongoing tired ness, otherwise doing well. No change in breathing. No change in bowel habits. No falls, headaches, double vision or seizure like activity. Rest of the pertinent 10 point ROS reviewed and negative  BREAST CANCER HISTORY: From the original intake note:  The patient herself palpated a mass in her right breast there is she brought it to her gynecologist, Dr. Alan Ripper attention and he obtained screening mammography which suggested an abnormality in the right breast. On 10/24/2014 the patient underwent right diagnostic mammography and ultrasonography at the breast Center. The breast density was category B. There was an ill-defined mass in the upper outer quadrant of the right breast which was palpable on exam. By ultrasound this was irregular and hypoechoic measuring 1.2 cm. Evaluation of the right axilla was negative.  Biopsy of the mass in question showed an invasive ductal carcinoma, grade 2 or 3, triple negative, with an MIB-1 of 19%.  Ms. Rawles case was presented at the multidisciplinary breast cancer conference 11/19/2014, where was  suggested she would benefit from genetics testing. Since this would mandate a significant delay in her final surgery, neoadjuvant chemotherapy was indicated.  The patient's subsequent history is as detailed below   PAST MEDICAL HISTORY: Past Medical History:  Diagnosis Date   Allergy    Breast cancer of upper-outer quadrant of right female breast ([facility]) 11/14/2014   Treated with chemotherapy and radiation   History of chemotherapy    finished chemo 02/03/2015   History of kidney stones    History of seizures    as a child - unknown cause - states was never on anticonvulsants   Hypertension    states under control with meds., has been on med. x "years"   Mitral valve prolapse 1990   Follow-up echocardiogram not show mitral prolapse.  Shows mild mitral vegetation.   Osteoporosis    Personal history of chemotherapy    Personal history of radiation therapy    Seasonal allergies    Seizures ([facility])    as a child-none as a adult    PAST SURGICAL HISTORY: Past Surgical History:  Procedure Laterality Date   ABDOMINAL HYSTERECTOMY  1998   partial   APPENDECTOMY  1976   BREAST LUMPECTOMY Right 02/2015   COLONOSCOPY  09/30/2016   KNEE ARTHROSCOPY Left 03/26/2008   PLANTAR'S WART EXCISION Left    x 2   PORT-A-CATH REMOVAL Left 04/06/2015   Procedure: REMOVAL PORT-A-CATH;  Surgeon: Rolm Bookbinder, MD;  Location: [facility];  Service: General;  Laterality: Left;   PORTACATH PLACEMENT N/A 11/27/2014   Procedure: INSERTION PORT-A-CATH;  Surgeon: Rolm Bookbinder, MD;  Location: [facility];  Service: General;  Laterality: N/A;   RADIOACTIVE SEED GUIDED PARTIAL MASTECTOMY WITH AXILLARY SENTINEL LYMPH NODE BIOPSY Right  03/02/2015   Procedure: RADIOACTIVE SEED GUIDED RIGHT PARTIAL MASTECTOMY WITH RIGHT AXILLARY SENTINEL LYMPH NODE BIOPSY;  Surgeon: Rolm Bookbinder, MD;  Location: [facility];  Service: General;  Laterality: Right;   SALIVARY STONE  REMOVAL Right 12/29/1970   TONSILLECTOMY  1970s   TRANSTHORACIC ECHOCARDIOGRAM  01/16/2020   EF 60 to 65%.  GRII DD.  No R WMA.  Normal RV size and function.  Rheumatic mitral valve with moderate thickening-hockey-stick appearing.  Moderate MR with mild MS.  Moderate LA dilation.  (Recommend follow-up in 2 to 3 years)   TUBAL LIGATION  1996    FAMILY HISTORY Family History  Problem Relation Age of Onset   Heart disease Mother    Stroke Mother    Hypertension Mother    Diabetes Mother    Diabetes Sister    Kidney disease Brother    Mental retardation Brother    Diabetes Sister    Heart attack Father    Breast cancer Cousin        deceased 69   Colon polyps Daughter    Colon cancer Neg Hx    Esophageal cancer Neg Hx    Rectal cancer Neg Hx    Stomach cancer Neg Hx    the patient knows little about her father. Her mother died at at the age of 36 following a stroke. The patient had 2 brothers, 2 sisters. The only history of breast or ovarian cancer in the family is a first cousin on the mother's side diagnosed with breast cancer at age 30   GYNECOLOGIC HISTORY:  Patient's last menstrual period was 10/10/1996. Menarche age 10, first live birth age 68. The patient is GX P2. She status post simple hysterectomy without salpingo-oophorectomy. She did not take hormone replacement.   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 The patient's husband, Jori Moll "Ron" Gwinner works for [employer] in the pattern shop. He also works as a Dispensing optician. Daughter Robbin Loughmiller lives in [place] and works in Programmer, applications. Daughter [name] Josten died in an [place] 29-Dec-2015.    ADVANCED DIRECTIVES: In the absence of any documentation to the contrary, the patient's spouse is their HCPOA.    HEALTH MAINTENANCE: Social History   Tobacco Use   Smoking status: Every Day    Packs/day: 0.25    Years: 43.00    Total pack years: 10.75    Types: Cigarettes   Smokeless  tobacco: Former    Quit date: 09/09/2016   Tobacco comments:    10 cig./day  Vaping Use   Vaping Use: Never used  Substance Use Topics   Alcohol use: Yes    Alcohol/week: 5.0 standard drinks of alcohol    Types: 5 Standard drinks or equivalent per week    Comment: occ    Drug use: No     Colonoscopy: 11/2019, (Dr. Havery Moros), repeat due Dec 29, 2026  PAP:  Bone density:  Lipid panel:  Allergies  Allergen Reactions   Sulfa Antibiotics Other (See Comments)    UNKNOWN    Current Outpatient Medications  Medication Sig Dispense Refill   albuterol (VENTOLIN HFA) 108 (90 Base) MCG/ACT inhaler Inhale 1-2 puffs into the lungs every 6 (six) hours as needed for wheezing or shortness of breath. 1 each 0   aspirin 81 MG tablet Take 1 tablet (81 mg total) by mouth daily. 30 tablet 0   chlorthalidone (HYGROTON) 25 MG tablet TAKE 1 TABLET(25 MG) BY MOUTH  EVERY OTHER DAY 45 tablet 3   enalapril (VASOTEC) 20 MG tablet TAKE 1 TABLET(20 MG) BY MOUTH AT BEDTIME 90 tablet 2   fluticasone (FLONASE) 50 MCG/ACT nasal spray Place 2 sprays into both nostrils as needed. 16 g 0   metoprolol succinate (TOPROL-XL) 100 MG 24 hr tablet Take 1 tablet (100 mg total) by mouth daily. Take with or immediately following a meal. 30 tablet 3   No current facility-administered medications for this visit.    OBJECTIVE: African-American woman in no acute distress  There were no vitals filed for this visit.      There is no height or weight on file to calculate BMI.    ECOG FS:1 - Symptomatic but completely ambulatory  Physical Exam Constitutional:      Appearance: Normal appearance.  HENT:     Head: Normocephalic and atraumatic.  Chest:     Comments: Bilateral breast examined.  Postop surgical scar-organized seroma in the right breast.  No other palpable masses.  No regional adenopathy Musculoskeletal:     Cervical back: Normal range of motion and neck supple. No rigidity.  Lymphadenopathy:     Cervical: No  cervical adenopathy.  Neurological:     Mental Status: She is alert.       LAB RESULTS:  CMP     Component Value Date/Time   NA 142 01/28/2022 1509   NA 139 12/28/2016 1359   K 4.1 01/28/2022 1509   K 3.6 12/28/2016 1359   CL 106 01/28/2022 1509   CO2 28 01/28/2022 1509   CO2 25 12/28/2016 1359   GLUCOSE 88 01/28/2022 1509   GLUCOSE 85 12/28/2016 1359   BUN 14 01/28/2022 1509   BUN 15.6 12/28/2016 1359   CREATININE 0.90 01/28/2022 1509   CREATININE 0.9 12/28/2016 1359   CALCIUM 9.7 01/28/2022 1509   CALCIUM 9.7 12/28/2016 1359   PROT 6.6 01/28/2022 1509   PROT 7.5 12/28/2016 1359   ALBUMIN 3.4 (L) 08/16/2021 1418   ALBUMIN 3.7 12/28/2016 1359   AST 13 01/28/2022 1509   AST 13 (L) 08/16/2021 1418   AST 14 12/28/2016 1359   ALT 14 01/28/2022 1509   ALT 13 08/16/2021 1418   ALT 17 12/28/2016 1359   ALKPHOS 74 08/16/2021 1418   ALKPHOS 86 12/28/2016 1359   BILITOT 0.4 01/28/2022 1509   BILITOT 0.4 08/16/2021 1418   BILITOT 0.35 12/28/2016 1359   GFRNONAA >60 08/16/2021 1418   GFRNONAA 70 09/02/2013 1128   GFRAA >60 12/28/2017 1342   GFRAA 81 09/02/2013 1128    INo results found for: "SPEP", "UPEP"  Lab Results  Component Value Date   WBC 6.8 08/16/2021   NEUTROABS 3.4 08/16/2021   HGB 13.4 08/16/2021   HCT 40.2 08/16/2021   MCV 94.6 08/16/2021   PLT 206 08/16/2021      Chemistry      Component Value Date/Time   NA 142 01/28/2022 1509   NA 139 12/28/2016 1359   K 4.1 01/28/2022 1509   K 3.6 12/28/2016 1359   CL 106 01/28/2022 1509   CO2 28 01/28/2022 1509   CO2 25 12/28/2016 1359   BUN 14 01/28/2022 1509   BUN 15.6 12/28/2016 1359   CREATININE 0.90 01/28/2022 1509   CREATININE 0.9 12/28/2016 1359      Component Value Date/Time   CALCIUM 9.7 01/28/2022 1509   CALCIUM 9.7 12/28/2016 1359   ALKPHOS 74 08/16/2021 1418   ALKPHOS 86 12/28/2016 1359   AST 13 01/28/2022  1509   AST 13 (L) 08/16/2021 1418   AST 14 12/28/2016 1359   ALT 14  01/28/2022 1509   ALT 13 08/16/2021 1418   ALT 17 12/28/2016 1359   BILITOT 0.4 01/28/2022 1509   BILITOT 0.4 08/16/2021 1418   BILITOT 0.35 12/28/2016 1359       No results found for: "LABCA2"  No components found for: "LABCA125"  No results for input(s): "INR" in the last 168 hours.  Urinalysis    Component Value Date/Time   COLORURINE YELLOW 08/10/2018 1507   APPEARANCEUR TURBID (A) 08/10/2018 1507   LABSPEC 1.024 08/10/2018 1507   PHURINE 5.5 08/10/2018 1507   GLUCOSEU NEGATIVE 08/10/2018 1507   GLUCOSEU NEGATIVE 09/06/2016 1225   HGBUR 1+ (A) 08/10/2018 1507   BILIRUBINUR NEGATIVE 09/06/2016 1225   KETONESUR 1+ (A) 08/10/2018 1507   PROTEINUR NEGATIVE 08/10/2018 1507   UROBILINOGEN 0.2 09/06/2016 1225   NITRITE NEGATIVE 08/10/2018 1507   LEUKOCYTESUR NEGATIVE 08/10/2018 1507    STUDIES: No results found.   ASSESSMENT: 66 y.o. [place], [place] woman status post right breast upper outer quadrant biopsy 11/12/2014 for a clinical T1c N0, stage IA invasive ductal carcinoma, triple negative, with an MIB-1 of 19%  (1) neoadjuvant chemotherapy consisting of cyclophosphamide and docetaxel 4 given every 21 days with Neulasta support, first dose 12/02/2014, completed 02/03/2015  (2) genetics testing through the [facility] gene panel at [facility] showed no deleterious mutations in ATM, BARD1, BRCA1, BRCA2, BRIP1, CDH1, CHEK2, MRE11A, MUTYH, NBN, NF1, PALB2, PTEN, RAD50, RAD51C, RAD51D, oe TP53.  (3) status post right lumpectomy and sentinel node sampling 03/02/2015 for a ypT1b ypN0 stage IA invasive ductal carcinoma, grade 1, with negative margins. Repeat HER-2 was again not amplified.  (4) radiation therapy completed August 2016  5. Mammogram  abnormal in Nov 2022, requiring additional biopsies, path neg for malignancy   PLAN Patient is here for follow-up.  Since last visit, no new health complaints except for ongoing chronic fatigue which is likely from  some sleep changes.  Physical examination today once again organized seroma in the right breast, no other palpable masses or regional adenopathy.  She will be due for another mammogram in November, this has been ordered. She can return to clinic once a year at this time or sooner as needed.  Total time spent: 20 minutes  *Total Encounter Time as defined by the Centers for Medicare and Medicaid Services includes, in addition to the face-to-face time of a patient visit (documented in the note above) non-face-to-face time: obtaining and reviewing outside history, ordering and reviewing medications, tests or procedures, care coordination (communications with other health care professionals or caregivers) and documentation in the medical record.

## 2022-05-19 ENCOUNTER — Telehealth: Payer: Self-pay | Admitting: Cardiology

## 2022-05-19 ENCOUNTER — Telehealth: Payer: Self-pay | Admitting: Family

## 2022-05-19 MED ORDER — METOPROLOL SUCCINATE ER 100 MG PO TB24: 100.0000 mg | ORAL_TABLET | Freq: Every day | ORAL | 3 refills | Status: DC

## 2022-05-19 NOTE — Telephone Encounter (Signed)
*  STAT* If patient is at the pharmacy, call can be transferred to refill team.   1. Which medications need to be refilled? (please list name of each medication and dose if known) metoprolol succinate (TOPROL-XL) 100 MG 24 hr tablet  2. Which pharmacy/location (including street and city if local pharmacy) is medication to be sent to?   [facility] [address] [place], [place] [address] - [phone]   3. Do they need a 30 day or 90 day supply? 90   Pt states she was previously prescribed '50mg'$  by Dr. Ellyn Hack however PCP increased the dosage. Pt states she called PCP for refill and they told her to call us. Pt is completely out of medication. Please advise with new pharmacy above because insurance change.

## 2022-05-19 NOTE — Telephone Encounter (Signed)
Pt has switched ins (updated in demographics) and is needing [facility]. She is completely out of metoprolol succinate and is needing this as soon as possible.   [facility] [address] [place], [place] [address]

## 2022-05-19 NOTE — Telephone Encounter (Signed)
Pt called stating her insurance is now figured out and she needs an order for an echo.

## 2022-05-19 NOTE — Telephone Encounter (Signed)
Spoke to patient she requested refill for Metoprolol.Refill sent to pharmacy.Stated she has a new insurance and is ready to schedule echo.Advised scheduler will call back with echo appointment and follow up visit with [facility].

## 2022-05-20 ENCOUNTER — Other Ambulatory Visit: Payer: Self-pay

## 2022-05-20 DIAGNOSIS — I341 Nonrheumatic mitral (valve) prolapse: Secondary | ICD-10-CM

## 2022-05-20 NOTE — Telephone Encounter (Signed)
Medication refilled on 05/19/22 and sent to pharmacy patient requested

## 2022-05-23 ENCOUNTER — Other Ambulatory Visit: Payer: Self-pay

## 2022-05-23 MED ORDER — FLUTICASONE PROPIONATE 50 MCG/ACT NA SUSP
2.0000 | NASAL | 0 refills | Status: DC | PRN
Start: 2022-05-23 — End: 2023-09-22

## 2022-05-23 MED ORDER — ALBUTEROL SULFATE HFA 108 (90 BASE) MCG/ACT IN AERS: 1.0000 | INHALATION_SPRAY | Freq: Four times a day (QID) | RESPIRATORY_TRACT | 0 refills | Status: AC | PRN

## 2022-05-23 NOTE — Telephone Encounter (Signed)
Pres[facility] on [address] (new name for [address])

## 2022-05-24 ENCOUNTER — Telehealth: Payer: Self-pay

## 2022-05-24 NOTE — Telephone Encounter (Signed)
Patient called again and was advised by Janann Colonel at front office I was trying to get her pharmacy information verified but I figured it out and medications sent yesterday.

## 2022-05-24 NOTE — Telephone Encounter (Signed)
Caller Name [place] Phone Number [phone] Call Type Message Only Information Provided Reason for Call Returning a Call from the Office Initial [place] states that she has missed a couple calls from the office. Disp. Time Disposition Final User 05/23/2022 5:10:01 PM General Information Provided Yes [place], [place] Call Closed By: Joanna Hews Transaction Date/Time: 05/23/2022 5:08:04 PM (ET)

## 2022-05-31 ENCOUNTER — Ambulatory Visit (HOSPITAL_COMMUNITY): Payer: 59 | Attending: Cardiology

## 2022-05-31 DIAGNOSIS — I340 Nonrheumatic mitral (valve) insufficiency: Secondary | ICD-10-CM | POA: Insufficient documentation

## 2022-05-31 DIAGNOSIS — I341 Nonrheumatic mitral (valve) prolapse: Secondary | ICD-10-CM | POA: Insufficient documentation

## 2022-05-31 HISTORY — PX: TRANSTHORACIC ECHOCARDIOGRAM: SHX275

## 2022-05-31 LAB — ECHOCARDIOGRAM COMPLETE
Area-P 1/2: 2.23 cm2
P 1/2 time: 456 msec
S' Lateral: 3.2 cm

## 2022-07-08 ENCOUNTER — Encounter: Payer: Self-pay | Admitting: Family

## 2022-07-08 ENCOUNTER — Ambulatory Visit (INDEPENDENT_AMBULATORY_CARE_PROVIDER_SITE_OTHER): Payer: 59

## 2022-07-08 DIAGNOSIS — Z23 Encounter for immunization: Secondary | ICD-10-CM

## 2022-08-15 ENCOUNTER — Ambulatory Visit: Payer: 59

## 2022-08-23 ENCOUNTER — Ambulatory Visit: Payer: 59 | Attending: Cardiology | Admitting: Cardiology

## 2022-08-23 VITALS — BP 126/60 | HR 70 | Ht 62.0 in | Wt 225.2 lb

## 2022-08-23 DIAGNOSIS — I341 Nonrheumatic mitral (valve) prolapse: Secondary | ICD-10-CM | POA: Diagnosis not present

## 2022-08-23 DIAGNOSIS — Z720 Tobacco use: Secondary | ICD-10-CM

## 2022-08-23 DIAGNOSIS — R002 Palpitations: Secondary | ICD-10-CM | POA: Diagnosis not present

## 2022-08-23 DIAGNOSIS — I10 Essential (primary) hypertension: Secondary | ICD-10-CM | POA: Diagnosis not present

## 2022-08-23 DIAGNOSIS — E785 Hyperlipidemia, unspecified: Secondary | ICD-10-CM

## 2022-08-23 DIAGNOSIS — I340 Nonrheumatic mitral (valve) insufficiency: Secondary | ICD-10-CM

## 2022-08-23 NOTE — Progress Notes (Signed)
Primary Care Provider: Debbrah Alar, NP [place] Cardiologist: Glenetta Hew, MD Electrophysiologist: None  Clinic Note: Chief Complaint  Patient presents with   Follow-up    Delayed annual.  Doing well.  No cardiac issues.    ===================================  ASSESSMENT/PLAN   Problem List Items Addressed This Visit       Cardiology Problems   Mitral valve prolapse - Primary (Chronic)    By echo, looks pretty stable-mild MR no MS.  Mild to moderate AI noted.  Previously of the moderate and moderate.  Continue to monitor every couple years.  No signs of arrhythmia. By updated guidelines, does not require SBE prophylaxis.      Hyperlipidemia LDL goal <100 (Chronic)    Labs checked by PCP back in August.  Based on results, the plan was to consider treatment of lipids.  She wanted to have some time for diet and exercise modification before considering treatment.  I think about a 38-monthgrace period is warranted, but at that point, if no improvement was noted, would probably consider starting statin therapy.  Would defer to PCP.      Essential hypertension (Chronic)    Blood pressure is great on current meds.  She is on chlorthalidone (25 mg daily) as well as high-dose Toprol (100 mg) and moderate dose enalapril (20 mg).  Well-controlled.   Labs were recently checked by PCP and others.      Relevant Orders   EKG 12-Lead   Mitral regurgitation due to cusp prolapse (Chronic)    Corrected ataxia was significant MR.  Continue to monitor.  Plan was every 2 years.  This with me in September 2025.  She does have the palpitations to be pretty well-controlled with beta-blocker.        Other   Intermittent palpitations (Chronic)    Well-controlled stable dose of Toprol now increased up to 100 mg daily.  He is doing fine at this point.  She PACs on EKG today but did not feel them.  No prolonged episodes      Relevant Orders   EKG 12-Lead   Tobacco  abuse (Chronic)    Metrics: Intervention Frequency ACO  Documented Smoking Status Yearly  Screened one or more times in 24 months  Cessation Counseling or  Active cessation medication Past 24 months  Past 24 months  Smoking cessation instruction/counseling given:  counseled patient on the dangers of tobacco use, advised patient to stop smoking, and reviewed strategies to maximize success        ===================================  HPI:    Katie VOREis a 66y.o. female with a PMH PMH notable for BREAST CANCER  (s/p Lumpectomy + Chemo/XRT  Sept 2016), HLD, Palpitations as well as Moderate MR/M who presents today for annual f/u to discuss results-- She returns at the request of [name] NP.  LNADIRA SINGLEwas last seen on August 04, 2021 -> echocardiogram point, just having issues recovering from CMartha  Poor sleep and fatigue related issue overall brain fog.  But noting more occasional palpitations electronically the longest irregular heartbeats.  No dizziness or syncope.  Recent Hospitalizations: None  Reviewed  CV studies:    The following studies were reviewed today: (if available, images/films reviewed: From Epic Chart or Care Everywhere) 05/31/2022: Normal LV size and function-EF 60 to 65%.  No RWMA. ??  Normal diastolic parameters but elevated LAP?  Normal RV with normal RVP and RAP.  Myxomatous mitral valve with mild MR and  no MS.  Mild to moderate AI with aortic sclerosis but no stenosis.  Interval History:   Katie Jacobson is a good spirits.  She is doing pretty well.  No major issues.  She denies any regular heartbeats palpitations on a prolonged period of few skipped beats here and there.  Never any additional doses of beta-blocker.  See has not been all that active, and has not really lost any weight.  She denies any PND, orthopnea with trivial swelling.  Blood pressures been well-controlled.  No syncope or near syncope.  No TIA or's.  No  claudication.   REVIEWED OF SYSTEMS   Review of Systems  Constitutional:  Negative for malaise/fatigue and weight loss.  HENT:  Negative for nosebleeds.   Respiratory:  Negative for cough and shortness of breath.   Cardiovascular:        Per HPI  Gastrointestinal:  Negative for abdominal pain, blood in stool, constipation, diarrhea and melena.  Genitourinary:  Negative for hematuria.       Having some urology issues.  Musculoskeletal:  Positive for joint pain. Negative for falls.       Stable R arm lymphedema  Neurological:  Negative for dizziness, weakness and headaches.  Endo/Heme/Allergies:  Positive for environmental allergies (Not active right now.). Does not bruise/bleed easily.  Psychiatric/Behavioral:  Negative for depression and memory loss. The patient is not nervous/anxious and does not have insomnia.    I have reviewed and (if needed) personally updated the patient's problem list, medications, allergies, past medical and surgical history, social and family history.   PAST MEDICAL HISTORY   Past Medical History:  Diagnosis Date   Allergy    Breast cancer of upper-outer quadrant of right female breast ([place]) 11/14/2014   Treated with chemotherapy and radiation   History of chemotherapy    finished chemo 02/03/2015   History of kidney stones    History of seizures    as a child - unknown cause - states was never on anticonvulsants   Hypertension    states under control with meds., has been on med. x "years"   Mitral valve prolapse 1990   Follow-up echocardiogram not show mitral prolapse.  Shows mild mitral vegetation.   Osteoporosis    Personal history of chemotherapy    Personal history of radiation therapy    Seasonal allergies    Seizures ([place])    as a child-none as a adult    PAST SURGICAL HISTORY   Past Surgical History:  Procedure Laterality Date   ABDOMINAL HYSTERECTOMY  1998   partial   APPENDECTOMY  1976   BREAST LUMPECTOMY Right 02/2015    COLONOSCOPY  09/30/2016   KNEE ARTHROSCOPY Left 03/26/2008   PLANTAR'S WART EXCISION Left    x 2   PORT-A-CATH REMOVAL Left 04/06/2015   Procedure: REMOVAL PORT-A-CATH;  Surgeon: Rolm Bookbinder, MD;  Location: [facility];  Service: General;  Laterality: Left;   PORTACATH PLACEMENT N/A 11/27/2014   Procedure: INSERTION PORT-A-CATH;  Surgeon: Rolm Bookbinder, MD;  Location: [facility];  Service: General;  Laterality: N/A;   RADIOACTIVE SEED GUIDED PARTIAL MASTECTOMY WITH AXILLARY SENTINEL LYMPH NODE BIOPSY Right 03/02/2015   Procedure: RADIOACTIVE SEED GUIDED RIGHT PARTIAL MASTECTOMY WITH RIGHT AXILLARY SENTINEL LYMPH NODE BIOPSY;  Surgeon: Rolm Bookbinder, MD;  Location: [facility];  Service: General;  Laterality: Right;   SALIVARY STONE REMOVAL Right [place] ECHOCARDIOGRAM  01/16/2020  EF 60 to 65%.  GRII DD.  No R WMA.  Normal RV size and function.  Rheumatic mitral valve with moderate thickening-hockey-stick appearing.  Moderate MR with mild MS.  Moderate LA dilation.  (Recommend follow-up in 2 to 3 years)   TRANSTHORACIC ECHOCARDIOGRAM  05/31/2022   Normal LV size and function-EF 60 to 65%.  No RWMA. ??  Normal diastolic parameters but elevated LAP?  Normal RV with normal RVP and RAP.  Manage mitral valve with mild MR and no MS.  Mild to moderate AI with aortic sclerosis but no stenosis.   TUBAL LIGATION  1996    Immunization History  Administered Date(s) Administered   COVID-19, mRNA, vaccine(Comirnaty)12 years and older 09/08/2022   Influenza Split 07/06/2011   Influenza,inj,Quad PF,6+ Mos 09/02/2013, 06/25/2015, 09/06/2016, 09/08/2017, 08/03/2018, 11/13/2020, 07/02/2021, 07/08/2022   PFIZER Comirnaty(Gray Top)Covid-19 Tri-Sucrose Vaccine 05/21/2021   PFIZER(Purple Top)SARS-COV-2 Vaccination 12/31/2019, 01/21/2020, 09/11/2020   Pneumococcal Polysaccharide-23 07/06/2012   Td 01/28/2022   Tdap  07/06/2011   Zoster Recombinat (Shingrix) 08/10/2018, 09/12/2018    MEDICATIONS/ALLERGIES   Current Meds  Medication Sig   albuterol (VENTOLIN HFA) 108 (90 Base) MCG/ACT inhaler Inhale 1-2 puffs into the lungs every 6 (six) hours as needed for wheezing or shortness of breath.   aspirin 81 MG tablet Take 1 tablet (81 mg total) by mouth daily.   chlorthalidone (HYGROTON) 25 MG tablet TAKE 1 TABLET(25 MG) BY MOUTH EVERY OTHER DAY   enalapril (VASOTEC) 20 MG tablet TAKE 1 TABLET(20 MG) BY MOUTH AT BEDTIME   fluticasone (FLONASE) 50 MCG/ACT nasal spray Place 2 sprays into both nostrils as needed.   metoprolol succinate (TOPROL-XL) 100 MG 24 hr tablet Take 1 tablet (100 mg total) by mouth daily. Take with or immediately following a meal.    Allergies  Allergen Reactions   Sulfa Antibiotics Other (See Comments)    UNKNOWN    SOCIAL HISTORY/FAMILY HISTORY   Reviewed in Epic:  Pertinent findings:  Social History   Tobacco Use   Smoking status: Every Day    Packs/day: 0.25    Years: 43.00    Total pack years: 10.75    Types: Cigarettes   Smokeless tobacco: Former    Quit date: 09/09/2016   Tobacco comments:    10 cig./day  Vaping Use   Vaping Use: Never used  Substance Use Topics   Alcohol use: Yes    Alcohol/week: 5.0 standard drinks of alcohol    Types: 5 Standard drinks or equivalent per week    Comment: occ    Drug use: No   Social History   Social History Narrative   Married with 2 daughters- born 20 and 46 ([name] died in [place] 12-26-2015)   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  Has been doing part time work with [employer] -- had 6 active clients, but the most important one is her Daughter.   Regular exercise:  No   Caffeine Use: none; Current 1ppd smoker x > 39 yrs; EtOH ~4 oz/week    OBJCTIVE -PE, EKG, labs   Wt Readings from Last 3 Encounters:  08/23/22 225 lb 3.2 oz (102.2 kg)  05/16/22 225 lb 1.6 oz (102.1 kg)   04/29/22 223 lb (101.2 kg)    Physical Exam: BP 126/60 (BP Location: Left Arm, Patient Position: Sitting, Cuff Size: Large)   Pulse 70   Ht '5\' 2"'$  (1.575 m)   Wt 225 lb 3.2 oz (102.2  kg)   LMP 10/10/1996   SpO2 96%   BMI 41.19 kg/m  Physical Exam Vitals reviewed.  Constitutional:      General: She is not in acute distress.    Appearance: Normal appearance. She is obese. She is not ill-appearing or toxic-appearing.     Comments: Morbidly obese.  Well-groomed.  HENT:     Head: Normocephalic and atraumatic.  Neck:     Vascular: No carotid bruit or JVD (Really unable to assess.).  Cardiovascular:     Rate and Rhythm: Normal rate and regular rhythm. No extrasystoles are present.    Chest Wall: PMI is not displaced (Unable to palpate).     Pulses: Intact distal pulses. Decreased pulses (Temperature palpate-warm feet).     Heart sounds: S1 normal and S2 normal. Heart sounds are distant. Murmur heard.     Harsh crescendo-decrescendo early systolic murmur is present with a grade of 2/6 at the upper right sternal border radiating to the neck.     High-pitched blowing holosystolic murmur of grade 1/6 is also present at the apex radiating to the back.     No friction rub. No gallop.  Pulmonary:     Effort: Pulmonary effort is normal. No respiratory distress.     Breath sounds: Normal breath sounds. No wheezing, rhonchi or rales.  Chest:     Chest wall: No tenderness.  Musculoskeletal:        General: Swelling (Mild/trivial bilateral puffy ankle swelling.. R arm minimal swelling - sleeve on) present.     Cervical back: Normal range of motion and neck supple.  Skin:    General: Skin is warm and dry.  Neurological:     General: No focal deficit present.     Mental Status: She is alert and oriented to person, place, and time.     Gait: Gait normal.  Psychiatric:        Mood and Affect: Mood normal.        Behavior: Behavior normal.        Thought Content: Thought content normal.         Judgment: Judgment normal.     Adult ECG Report  Rate: 70 ;  Rhythm: normal sinus rhythm, premature atrial contractions (PAC), and Left Atrial abnormality; Normal axis, intervals, voltrage & durations.  ;   Narrative Interpretation: stable  Recent Labs:  PCP checked labs in Aug. - ? Per pt - plan was to try diet/exercise / lifestyle mod b4 meds for Lipids  Lab Results  Component Value Date   CHOL 209 (H) 08/10/2018   HDL 76 08/10/2018   LDLCALC 116 (H) 08/10/2018   TRIG 73 08/10/2018   CHOLHDL 2.8 08/10/2018   Lab Results  Component Value Date   CREATININE 0.90 01/28/2022   BUN 14 01/28/2022   NA 142 01/28/2022   K 4.1 01/28/2022   CL 106 01/28/2022   CO2 28 01/28/2022      Latest Ref Rng & Units 08/16/2021    2:18 PM 08/13/2020    2:22 PM 08/10/2018    3:07 PM  CBC  WBC 4.0 - 10.5 K/uL 6.8  6.5  6.5   Hemoglobin 12.0 - 15.0 g/dL 13.4  14.1  14.4   Hematocrit 36.0 - 46.0 % 40.2  42.6  42.0   Platelets 150 - 400 K/uL 206  231  235     No results found for: "HGBA1C" Lab Results  Component Value Date   TSH 0.83 10/15/2019    ==================================================  I spent a total of 25 min with the patient spent in direct patient consultation.  Additional time spent with chart review  / charting (studies, outside notes, etc): 13 min Total Time: 33 min  Current medicines are reviewed at length with the patient today.  (+/- concerns) n/a  Notice: This dictation was prepared with Dragon dictation along with smart phrase technology. Any transcriptional errors that result from this process are unintentional and may not be corrected upon review.  Studies Ordered:   Orders Placed This Encounter  Procedures   EKG 12-Lead   No orders of the defined types were placed in this encounter.   Patient Instructions / Medication Changes & Studies & Tests Ordered   Patient Instructions  Medication Instructions:  No changes   *If you need a refill on your  cardiac medications before your next appointment, please call your pharmacy*   Lab Work:  Not needed   Testing/Procedures:  Not needed  Follow-Up: At [facility], you and your health needs are our priority.  As part of our continuing mission to provide you with exceptional heart care, we have created designated Provider Care Teams.  These Care Teams include your primary Cardiologist (physician) and Advanced Practice Providers (APPs -  Physician Assistants and Nurse Practitioners) who all work together to provide you with the care you need, when you need it.     Your next appointment:   12 year(s)  The format for your next appointment:   In Person  Provider:   Glenetta Hew, MD      Leonie Man, MD, MS Glenetta Hew, M.D., M.S. Interventional Cardiologist  [place]  Pager # [phone] Phone # [phone] [address] [place], [place] [address]   Thank you for choosing [facility] at [facility]!!

## 2022-08-23 NOTE — Patient Instructions (Signed)
Medication Instructions:  No changes   *If you need a refill on your cardiac medications before your next appointment, please call your pharmacy*   Lab Work:  Not needed   Testing/Procedures:  Not needed  Follow-Up: At [facility], you and your health needs are our priority.  As part of our continuing mission to provide you with exceptional heart care, we have created designated Provider Care Teams.  These Care Teams include your primary Cardiologist (physician) and Advanced Practice Providers (APPs -  Physician Assistants and Nurse Practitioners) who all work together to provide you with the care you need, when you need it.     Your next appointment:   12 year(s)  The format for your next appointment:   In Person  Provider:   Glenetta Hew, MD

## 2022-08-29 ENCOUNTER — Ambulatory Visit
Admission: RE | Admit: 2022-08-29 | Discharge: 2022-08-29 | Disposition: A | Discharge status: Home / Self Care | Payer: 59 | Source: Ambulatory Visit | Attending: Hematology and Oncology | Admitting: Hematology and Oncology

## 2022-08-29 DIAGNOSIS — Z1231 Encounter for screening mammogram for malignant neoplasm of breast: Secondary | ICD-10-CM | POA: Diagnosis not present

## 2022-08-29 DIAGNOSIS — Z171 Estrogen receptor negative status [ER-]: Secondary | ICD-10-CM

## 2022-09-05 ENCOUNTER — Other Ambulatory Visit: Payer: Self-pay | Admitting: Hematology and Oncology

## 2022-09-05 DIAGNOSIS — R928 Other abnormal and inconclusive findings on diagnostic imaging of breast: Secondary | ICD-10-CM

## 2022-09-08 ENCOUNTER — Other Ambulatory Visit (HOSPITAL_BASED_OUTPATIENT_CLINIC_OR_DEPARTMENT_OTHER): Payer: Self-pay

## 2022-09-08 MED ORDER — COMIRNATY 30 MCG/0.3ML IM SUSY
PREFILLED_SYRINGE | INTRAMUSCULAR | 0 refills | Status: DC
  Filled 2022-09-08: qty 0.3, 1d supply, fill #0

## 2022-09-13 ENCOUNTER — Other Ambulatory Visit: Payer: 59

## 2022-09-13 ENCOUNTER — Ambulatory Visit: Payer: 59 | Admitting: Hematology and Oncology

## 2022-09-18 ENCOUNTER — Encounter: Payer: Self-pay | Admitting: Cardiology

## 2022-09-18 NOTE — Assessment & Plan Note (Signed)
Labs checked by PCP back in August.  Based on results, the plan was to consider treatment of lipids.  She wanted to have some time for diet and exercise modification before considering treatment.  I think about a 42-monthgrace period is warranted, but at that point, if no improvement was noted, would probably consider starting statin therapy.  Would defer to PCP.

## 2022-09-18 NOTE — Assessment & Plan Note (Signed)
Well-controlled stable dose of Toprol now increased up to 100 mg daily.  He is doing fine at this point.  She PACs on EKG today but did not feel them.  No prolonged episodes

## 2022-09-18 NOTE — Assessment & Plan Note (Signed)
Metrics: Intervention Frequency ACO  Documented Smoking Status Yearly  Screened one or more times in 24 months  Cessation Counseling or  Active cessation medication Past 24 months  Past 24 months   Smoking cessation instruction/counseling given:  counseled patient on the dangers of tobacco use, advised patient to stop smoking, and reviewed strategies to maximize success

## 2022-09-18 NOTE — Assessment & Plan Note (Addendum)
>>  ASSESSMENT AND PLAN FOR MITRAL REGURGITATION DUE TO CUSP PROLAPSE WRITTEN ON 09/18/2022  5:52 PM BY Jayveon Convey W, MD  Corrected ataxia was significant MR.  Continue to monitor.  Plan was every 2 years.  This with me in September 2025.  She does have the palpitations to be pretty well-controlled with beta-blocker.   >>ASSESSMENT AND PLAN FOR MITRAL VALVE PROLAPSE WRITTEN ON 09/18/2022  5:51 PM BY Shaunae Sieloff W, MD  By echo, looks pretty stable-mild MR no MS.  Mild to moderate AI noted.  Previously of the moderate and moderate.  Continue to monitor every couple years.  No signs of arrhythmia. By updated guidelines, does not require SBE prophylaxis.

## 2022-09-18 NOTE — Assessment & Plan Note (Signed)
By echo, looks pretty stable-mild MR no MS.  Mild to moderate AI noted.  Previously of the moderate and moderate.  Continue to monitor every couple years.  No signs of arrhythmia. By updated guidelines, does not require SBE prophylaxis.

## 2022-09-18 NOTE — Assessment & Plan Note (Signed)
Blood pressure is great on current meds.  She is on chlorthalidone (25 mg daily) as well as high-dose Toprol (100 mg) and moderate dose enalapril (20 mg).  Well-controlled.   Labs were recently checked by PCP and others.

## 2022-09-19 ENCOUNTER — Ambulatory Visit
Admission: RE | Admit: 2022-09-19 | Discharge: 2022-09-19 | Disposition: A | Discharge status: Home / Self Care | Payer: 59 | Source: Ambulatory Visit | Attending: Hematology and Oncology | Admitting: Hematology and Oncology

## 2022-09-19 DIAGNOSIS — N6321 Unspecified lump in the left breast, upper outer quadrant: Secondary | ICD-10-CM | POA: Diagnosis not present

## 2022-09-19 DIAGNOSIS — R928 Other abnormal and inconclusive findings on diagnostic imaging of breast: Secondary | ICD-10-CM

## 2022-09-19 DIAGNOSIS — N6312 Unspecified lump in the right breast, upper inner quadrant: Secondary | ICD-10-CM | POA: Diagnosis not present

## 2022-09-19 DIAGNOSIS — N6311 Unspecified lump in the right breast, upper outer quadrant: Secondary | ICD-10-CM | POA: Diagnosis not present

## 2022-09-20 ENCOUNTER — Other Ambulatory Visit: Payer: Self-pay | Admitting: Hematology and Oncology

## 2022-09-20 DIAGNOSIS — N6310 Unspecified lump in the right breast, unspecified quadrant: Secondary | ICD-10-CM

## 2022-09-20 DIAGNOSIS — N6320 Unspecified lump in the left breast, unspecified quadrant: Secondary | ICD-10-CM

## 2022-09-28 ENCOUNTER — Telehealth: Payer: Self-pay | Admitting: Hematology and Oncology

## 2022-09-28 NOTE — Telephone Encounter (Signed)
Contacted patient to scheduled appointments. Left message with appointment details and a call back number if patient had any questions or could not accommodate the time we provided.   

## 2022-09-30 ENCOUNTER — Ambulatory Visit
Admission: RE | Admit: 2022-09-30 | Discharge: 2022-09-30 | Disposition: A | Discharge status: Home / Self Care | Payer: 59 | Source: Ambulatory Visit | Attending: Hematology and Oncology | Admitting: Hematology and Oncology

## 2022-09-30 DIAGNOSIS — N6320 Unspecified lump in the left breast, unspecified quadrant: Secondary | ICD-10-CM

## 2022-09-30 DIAGNOSIS — N6310 Unspecified lump in the right breast, unspecified quadrant: Secondary | ICD-10-CM

## 2022-09-30 DIAGNOSIS — N6311 Unspecified lump in the right breast, upper outer quadrant: Secondary | ICD-10-CM | POA: Diagnosis not present

## 2022-09-30 DIAGNOSIS — C50811 Malignant neoplasm of overlapping sites of right female breast: Secondary | ICD-10-CM | POA: Diagnosis not present

## 2022-09-30 DIAGNOSIS — C50211 Malignant neoplasm of upper-inner quadrant of right female breast: Secondary | ICD-10-CM | POA: Diagnosis not present

## 2022-09-30 DIAGNOSIS — N6312 Unspecified lump in the right breast, upper inner quadrant: Secondary | ICD-10-CM | POA: Diagnosis not present

## 2022-09-30 DIAGNOSIS — C50412 Malignant neoplasm of upper-outer quadrant of left female breast: Secondary | ICD-10-CM | POA: Diagnosis not present

## 2022-09-30 DIAGNOSIS — N6321 Unspecified lump in the left breast, upper outer quadrant: Secondary | ICD-10-CM | POA: Diagnosis not present

## 2022-09-30 HISTORY — PX: BREAST BIOPSY: SHX20

## 2022-10-13 ENCOUNTER — Other Ambulatory Visit: Payer: Self-pay

## 2022-10-13 ENCOUNTER — Inpatient Hospital Stay: Payer: 59 | Attending: Hematology and Oncology | Admitting: Hematology and Oncology

## 2022-10-13 VITALS — BP 124/76 | HR 80 | Temp 98.1°F | Resp 16 | Ht 62.0 in | Wt 221.3 lb

## 2022-10-13 DIAGNOSIS — I10 Essential (primary) hypertension: Secondary | ICD-10-CM | POA: Insufficient documentation

## 2022-10-13 DIAGNOSIS — Z9221 Personal history of antineoplastic chemotherapy: Secondary | ICD-10-CM | POA: Insufficient documentation

## 2022-10-13 DIAGNOSIS — Z87442 Personal history of urinary calculi: Secondary | ICD-10-CM | POA: Insufficient documentation

## 2022-10-13 DIAGNOSIS — Z83719 Family history of colon polyps, unspecified: Secondary | ICD-10-CM | POA: Diagnosis not present

## 2022-10-13 DIAGNOSIS — Z841 Family history of disorders of kidney and ureter: Secondary | ICD-10-CM | POA: Insufficient documentation

## 2022-10-13 DIAGNOSIS — Z7289 Other problems related to lifestyle: Secondary | ICD-10-CM | POA: Insufficient documentation

## 2022-10-13 DIAGNOSIS — Z9049 Acquired absence of other specified parts of digestive tract: Secondary | ICD-10-CM | POA: Diagnosis not present

## 2022-10-13 DIAGNOSIS — Z8249 Family history of ischemic heart disease and other diseases of the circulatory system: Secondary | ICD-10-CM | POA: Insufficient documentation

## 2022-10-13 DIAGNOSIS — Z833 Family history of diabetes mellitus: Secondary | ICD-10-CM | POA: Insufficient documentation

## 2022-10-13 DIAGNOSIS — Z923 Personal history of irradiation: Secondary | ICD-10-CM | POA: Insufficient documentation

## 2022-10-13 DIAGNOSIS — Z90710 Acquired absence of both cervix and uterus: Secondary | ICD-10-CM | POA: Insufficient documentation

## 2022-10-13 DIAGNOSIS — Z803 Family history of malignant neoplasm of breast: Secondary | ICD-10-CM | POA: Diagnosis not present

## 2022-10-13 DIAGNOSIS — N6489 Other specified disorders of breast: Secondary | ICD-10-CM | POA: Insufficient documentation

## 2022-10-13 DIAGNOSIS — Z882 Allergy status to sulfonamides status: Secondary | ICD-10-CM | POA: Diagnosis not present

## 2022-10-13 DIAGNOSIS — Z171 Estrogen receptor negative status [ER-]: Secondary | ICD-10-CM

## 2022-10-13 DIAGNOSIS — Z79899 Other long term (current) drug therapy: Secondary | ICD-10-CM | POA: Diagnosis not present

## 2022-10-13 DIAGNOSIS — C50411 Malignant neoplasm of upper-outer quadrant of right female breast: Secondary | ICD-10-CM | POA: Diagnosis not present

## 2022-10-13 DIAGNOSIS — Z823 Family history of stroke: Secondary | ICD-10-CM | POA: Insufficient documentation

## 2022-10-13 DIAGNOSIS — F17210 Nicotine dependence, cigarettes, uncomplicated: Secondary | ICD-10-CM | POA: Insufficient documentation

## 2022-10-13 DIAGNOSIS — Z818 Family history of other mental and behavioral disorders: Secondary | ICD-10-CM | POA: Diagnosis not present

## 2022-10-13 DIAGNOSIS — R69 Illness, unspecified: Secondary | ICD-10-CM | POA: Diagnosis not present

## 2022-10-13 DIAGNOSIS — C50412 Malignant neoplasm of upper-outer quadrant of left female breast: Secondary | ICD-10-CM | POA: Insufficient documentation

## 2022-10-13 DIAGNOSIS — I341 Nonrheumatic mitral (valve) prolapse: Secondary | ICD-10-CM | POA: Diagnosis not present

## 2022-10-13 DIAGNOSIS — C50811 Malignant neoplasm of overlapping sites of right female breast: Secondary | ICD-10-CM | POA: Diagnosis not present

## 2022-10-13 NOTE — Progress Notes (Signed)
[place]  Telephone:([phone] Fax:(336) [phone]     ID: NIOMIE Jacobson DOB: 19-Sep-1956  MR#: [id number]  NOM#:[id number]  Patient Care Team: Katie Alar, NP as PCP - General (Internal Medicine) Katie Man, MD as PCP - Cardiology (Cardiology) Katie Bookbinder, MD as Consulting Physician (General Surgery) Katie Gibson, MD as Attending Physician (Radiation Oncology) Katie Jacobson, Katie Raspberry, MD as Consulting Physician (Gastroenterology) Katie Pike, MD as Consulting Physician (Hematology and Oncology)  CHIEF COMPLAINT: Triple negative breast cancer  CURRENT TREATMENT: observation  INTERVAL HISTORY:  Katie Jacobson returns today for follow-up of her triple negative breast cancer.   Rest of the pertinent 10 point ROS reviewed and negative  BREAST CANCER HISTORY: From the original intake note:  The patient herself palpated a mass in her right breast there is she brought it to her gynecologist, Dr. Alan Jacobson attention and he obtained screening mammography which suggested an abnormality in the right breast. On 10/24/2014 the patient underwent right diagnostic mammography and ultrasonography at the breast Center. The breast density was category B. There was an ill-defined mass in the upper outer quadrant of the right breast which was palpable on exam. By ultrasound this was irregular and hypoechoic measuring 1.2 cm. Evaluation of the right axilla was negative.  Biopsy of the mass in question showed an invasive ductal carcinoma, grade 2 or 3, triple negative, with an MIB-1 of 19%.  Katie Jacobson case was presented at the multidisciplinary breast cancer conference 11/19/2014, where was suggested she would benefit from genetics testing. Since this would mandate a significant delay in her final surgery, neoadjuvant chemotherapy was indicated.  The patient's subsequent history is as detailed below   PAST MEDICAL HISTORY: Past Medical History:  Diagnosis Date    Allergy    Breast cancer of upper-outer quadrant of right female breast ([place]) 11/14/2014   Treated with chemotherapy and radiation   History of chemotherapy    finished chemo 02/03/2015   History of kidney stones    History of seizures    as a child - unknown cause - states was never on anticonvulsants   Hypertension    states under control with meds., has been on med. x "years"   Mitral valve prolapse 1990   Follow-up echocardiogram not show mitral prolapse.  Shows mild mitral vegetation.   Osteoporosis    Personal history of chemotherapy    Personal history of radiation therapy    Seasonal allergies    Seizures ([place])    as a child-none as a adult    PAST SURGICAL HISTORY: Past Surgical History:  Procedure Laterality Date   ABDOMINAL HYSTERECTOMY  1998   partial   APPENDECTOMY  1976   BREAST BIOPSY Right 09/30/2022   [place] RT BREAST BX W LOC DEV 1ST LESION IMG BX SPEC US GUIDE 09/30/2022 [facility]   BREAST BIOPSY Left 09/30/2022   [place] LT BREAST BX W LOC DEV 1ST LESION IMG BX SPEC US GUIDE 09/30/2022 [facility]   BREAST BIOPSY Right 09/30/2022   [place] RT BREAST BX W LOC DEV EA ADD LESION IMG BX SPEC US GUIDE 09/30/2022 [facility]   BREAST LUMPECTOMY Right 02/2015   COLONOSCOPY  09/30/2016   KNEE ARTHROSCOPY Left 03/26/2008   PLANTAR'S WART EXCISION Left    x 2   PORT-A-CATH REMOVAL Left 04/06/2015   Procedure: REMOVAL PORT-A-CATH;  Surgeon: Katie Bookbinder, MD;  Location: [facility];  Service: General;  Laterality: Left;   PORTACATH PLACEMENT N/A 11/27/2014  Procedure: INSERTION PORT-A-CATH;  Surgeon: Katie Bookbinder, MD;  Location: [place];  Service: General;  Laterality: N/A;   RADIOACTIVE SEED GUIDED PARTIAL MASTECTOMY WITH AXILLARY SENTINEL LYMPH NODE BIOPSY Right 03/02/2015   Procedure: RADIOACTIVE SEED GUIDED RIGHT PARTIAL MASTECTOMY WITH RIGHT AXILLARY SENTINEL LYMPH NODE BIOPSY;  Surgeon: Katie Bookbinder,  MD;  Location: [place];  Service: General;  Laterality: Right;   SALIVARY STONE REMOVAL Right 1970-12-11   TONSILLECTOMY  1970s   TRANSTHORACIC ECHOCARDIOGRAM  01/16/2020   EF 60 to 65%.  GRII DD.  No R WMA.  Normal RV size and function.  Rheumatic mitral valve with moderate thickening-hockey-stick appearing.  Moderate MR with mild MS.  Moderate LA dilation.  (Recommend follow-up in 2 to 3 years)   TRANSTHORACIC ECHOCARDIOGRAM  05/31/2022   Normal LV size and function-EF 60 to 65%.  No RWMA. ??  Normal diastolic parameters but elevated LAP?  Normal RV with normal RVP and RAP.  Manage mitral valve with mild MR and no MS.  Mild to moderate AI with aortic sclerosis but no stenosis.   TUBAL LIGATION  1996    FAMILY HISTORY Family History  Problem Relation Age of Onset   Heart disease Mother    Stroke Mother    Hypertension Mother    Diabetes Mother    Diabetes Sister    Kidney disease Brother    Mental retardation Brother    Diabetes Sister    Heart attack Father    Breast cancer Cousin        deceased 89   Colon polyps Daughter    Colon cancer Neg Hx    Esophageal cancer Neg Hx    Rectal cancer Neg Hx    Stomach cancer Neg Hx    the patient knows little about her father. Her mother died at at the age of 71 following a stroke. The patient had 2 brothers, 2 sisters. The only history of breast or ovarian cancer in the family is a first cousin on the mother's side diagnosed with breast cancer at age 55   GYNECOLOGIC HISTORY:  Patient's last menstrual period was 10/10/1996. Menarche age 63, first live birth age 68. The patient is GX P2. She status post simple hysterectomy without salpingo-oophorectomy. She did not take hormone replacement.   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 The patient's husband, Katie Jacobson works for [employer] in the pattern shop. He also works as a Dispensing optician. Daughter Katie Jacobson lives  in [place] and works in Programmer, applications. Daughter Katie Jacobson died in an [place] 2015-12-12.    ADVANCED DIRECTIVES: In the absence of any documentation to the contrary, the patient's spouse is their HCPOA.    HEALTH MAINTENANCE: Social History   Tobacco Use   Smoking status: Every Day    Packs/day: 0.25    Years: 43.00    Total pack years: 10.75    Types: Cigarettes   Smokeless tobacco: Former    Quit date: 09/09/2016   Tobacco comments:    10 cig./day  Vaping Use   Vaping Use: Never used  Substance Use Topics   Alcohol use: Yes    Alcohol/week: 5.0 standard drinks of alcohol    Types: 5 Standard drinks or equivalent per week    Comment: occ    Drug use: No     Colonoscopy: 12-Dec-2019, (Dr. Havery Moros), repeat due 12/11/26  PAP:  Bone density:  Lipid  panel:  Allergies  Allergen Reactions   Sulfa Antibiotics Other (See Comments)    UNKNOWN    Current Outpatient Medications  Medication Sig Dispense Refill   albuterol (VENTOLIN HFA) 108 (90 Base) MCG/ACT inhaler Inhale 1-2 puffs into the lungs every 6 (six) hours as needed for wheezing or shortness of breath. 1 each 0   aspirin 81 MG tablet Take 1 tablet (81 mg total) by mouth daily. 30 tablet 0   chlorthalidone (HYGROTON) 25 MG tablet TAKE 1 TABLET(25 MG) BY MOUTH EVERY OTHER DAY 45 tablet 3   COVID-19 mRNA vaccine 2023-2024 (COMIRNATY) syringe Inject into the muscle. 0.3 mL 0   enalapril (VASOTEC) 20 MG tablet TAKE 1 TABLET(20 MG) BY MOUTH AT BEDTIME 90 tablet 2   fluticasone (FLONASE) 50 MCG/ACT nasal spray Place 2 sprays into both nostrils as needed. 16 g 0   metoprolol succinate (TOPROL-XL) 100 MG 24 hr tablet Take 1 tablet (100 mg total) by mouth daily. Take with or immediately following a meal. 90 tablet 3   No current facility-administered medications for this visit.    OBJECTIVE: African-American woman in no acute distress  Vitals:   10/13/22 1546  BP: 124/76  Pulse: 80  Resp: 16  Temp: 98.1 F (36.7 C)  SpO2: 100%         Body mass index is 40.48 kg/m.    ECOG FS:1 - Symptomatic but completely ambulatory  Physical Exam Constitutional:      Appearance: Normal appearance.  HENT:     Head: Normocephalic and atraumatic.  Chest:     Comments: Bilateral breast examined. Post biopsy changes noted. No regional adenopathy Musculoskeletal:     Cervical back: Normal range of motion and neck supple. No rigidity.  Lymphadenopathy:     Cervical: No cervical adenopathy.  Neurological:     Mental Status: She is alert.       LAB RESULTS:  CMP     Component Value Date/Time   NA 142 01/28/2022 1509   NA 139 12/28/2016 1359   K 4.1 01/28/2022 1509   K 3.6 12/28/2016 1359   CL 106 01/28/2022 1509   CO2 28 01/28/2022 1509   CO2 25 12/28/2016 1359   GLUCOSE 88 01/28/2022 1509   GLUCOSE 85 12/28/2016 1359   BUN 14 01/28/2022 1509   BUN 15.6 12/28/2016 1359   CREATININE 0.90 01/28/2022 1509   CREATININE 0.9 12/28/2016 1359   CALCIUM 9.7 01/28/2022 1509   CALCIUM 9.7 12/28/2016 1359   PROT 6.6 01/28/2022 1509   PROT 7.5 12/28/2016 1359   ALBUMIN 3.4 (L) 08/16/2021 1418   ALBUMIN 3.7 12/28/2016 1359   AST 13 01/28/2022 1509   AST 13 (L) 08/16/2021 1418   AST 14 12/28/2016 1359   ALT 14 01/28/2022 1509   ALT 13 08/16/2021 1418   ALT 17 12/28/2016 1359   ALKPHOS 74 08/16/2021 1418   ALKPHOS 86 12/28/2016 1359   BILITOT 0.4 01/28/2022 1509   BILITOT 0.4 08/16/2021 1418   BILITOT 0.35 12/28/2016 1359   GFRNONAA >60 08/16/2021 1418   GFRNONAA 70 09/02/2013 1128   GFRAA >60 12/28/2017 1342   GFRAA 81 09/02/2013 1128    INo results found for: "SPEP", "UPEP"  Lab Results  Component Value Date   WBC 6.8 08/16/2021   NEUTROABS 3.4 08/16/2021   HGB 13.4 08/16/2021   HCT 40.2 08/16/2021   MCV 94.6 08/16/2021   PLT 206 08/16/2021      Chemistry  Component Value Date/Time   NA 142 01/28/2022 1509   NA 139 12/28/2016 1359   K 4.1 01/28/2022 1509   K 3.6 12/28/2016 1359   CL 106  01/28/2022 1509   CO2 28 01/28/2022 1509   CO2 25 12/28/2016 1359   BUN 14 01/28/2022 1509   BUN 15.6 12/28/2016 1359   CREATININE 0.90 01/28/2022 1509   CREATININE 0.9 12/28/2016 1359      Component Value Date/Time   CALCIUM 9.7 01/28/2022 1509   CALCIUM 9.7 12/28/2016 1359   ALKPHOS 74 08/16/2021 1418   ALKPHOS 86 12/28/2016 1359   AST 13 01/28/2022 1509   AST 13 (L) 08/16/2021 1418   AST 14 12/28/2016 1359   ALT 14 01/28/2022 1509   ALT 13 08/16/2021 1418   ALT 17 12/28/2016 1359   BILITOT 0.4 01/28/2022 1509   BILITOT 0.4 08/16/2021 1418   BILITOT 0.35 12/28/2016 1359       No results found for: "LABCA2"  No components found for: "LABCA125"  No results for input(s): "INR" in the last 168 hours.  Urinalysis    Component Value Date/Time   COLORURINE YELLOW 08/10/2018 1507   APPEARANCEUR TURBID (A) 08/10/2018 1507   LABSPEC 1.024 08/10/2018 1507   PHURINE 5.5 08/10/2018 1507   GLUCOSEU NEGATIVE 08/10/2018 1507   GLUCOSEU NEGATIVE 09/06/2016 1225   HGBUR 1+ (A) 08/10/2018 1507   BILIRUBINUR NEGATIVE 09/06/2016 1225   KETONESUR 1+ (A) 08/10/2018 1507   PROTEINUR NEGATIVE 08/10/2018 1507   UROBILINOGEN 0.2 09/06/2016 1225   NITRITE NEGATIVE 08/10/2018 1507   LEUKOCYTESUR NEGATIVE 08/10/2018 1507    STUDIES: [place] RT BREAST BX W LOC DEV 1ST LESION IMG BX SPEC US GUIDE  Addendum Date: 10/11/2022   ADDENDUM REPORT: 10/11/2022 08:03 ADDENDUM: Pathology revealed GRADE 2 INVASIVE DUCTAL CARCINOMA of the RIGHT breast, 12 o'clock, 1 cmfn (ribbon clip). This was found to be concordant by Dr. Kristopher Oppenheim. Pathology revealed GRADE 2 INVASIVE DUCTAL CARCINOMA of the RIGHT breast, 12:30 o'clock, 4 cmfn (heart clip). This was found to be concordant by Dr. Kristopher Oppenheim. Pathology revealed GRADE 2 INVASIVE DUCTAL CARCINOMA of the LEFT breast, 1:00 o'clock, 3 cmfn (ribbon clip). This was found to be concordant by Dr. Kristopher Oppenheim. Pathology results were discussed with the patient  and husband, Katie Jacobson by telephone. The patient reported doing well after the biopsies with tenderness at the sites. Post biopsy instructions and care were reviewed and questions were answered. The patient was encouraged to call [facility] for any additional concerns. The patient is scheduled with Dr. Benay Jacobson at [facility] on October 13, 2022. Surgical consultation has been tentatively arranged with Dr. Rolm Jacobson at [facility] on October 28, 2022, per patient request. Pathology results reported by Stacie Acres RN on 10/05/2022. Electronically Signed   By: Kristopher Oppenheim M.D.   On: 10/11/2022 08:03   Result Date: 10/11/2022 CLINICAL DATA:  67 year old female with suspicious bilateral breast masses. EXAM: ULTRASOUND GUIDED BILATERAL BREAST CORE NEEDLE BIOPSY COMPARISON:  Previous exam(s). PROCEDURE: I met with the patient and we discussed the procedure of ultrasound-guided biopsy, including benefits and alternatives. We discussed the high likelihood of a successful procedure. We discussed the risks of the procedure, including infection, bleeding, tissue injury, clip migration, and inadequate sampling. Informed written consent was given. The usual time-out protocol was performed immediately prior to the procedure. Lesion quadrant: Upper outer quadrant Using sterile technique and 1% Lidocaine as local  anesthetic, under direct ultrasound visualization, a 14 gauge spring-loaded device was used to perform biopsy of a mass at the 12 o'clock position 1 cm from the nipple using a lateral approach. At the conclusion of the procedure a ribbon shaped tissue marker clip was deployed into the biopsy cavity. Lesion quadrant: Upper inner quadrant Using sterile technique and 1% Lidocaine as local anesthetic, under direct ultrasound visualization, a 14 gauge spring-loaded device was used to perform biopsy of a mass at the 12:30 position 4 cm from the  nipple using a lateral approach. At the conclusion of the procedure a heart shaped tissue marker clip was deployed into the biopsy cavity. Lesion quadrant: Upper outer quadrant Using sterile technique and 1% Lidocaine as local anesthetic, under direct ultrasound visualization, a 14 gauge spring-loaded device was used to perform biopsy of a mass at the 1 o'clock position 3 cm from the nipple using a inferolateral approach. At the conclusion of the procedure a ribbon shaped tissue marker clip was deployed into the biopsy cavity. Follow up 2 view mammogram was performed and dictated separately. IMPRESSION: Ultrasound guided biopsy of the bilateral breasts. No apparent complications. Electronically Signed: By: Kristopher Oppenheim M.D. On: 09/30/2022 14:27  [place] RT BREAST BX W LOC DEV EA ADD LESION IMG BX SPEC US GUIDE  Addendum Date: 10/11/2022   ADDENDUM REPORT: 10/11/2022 08:03 ADDENDUM: Pathology revealed GRADE 2 INVASIVE DUCTAL CARCINOMA of the RIGHT breast, 12 o'clock, 1 cmfn (ribbon clip). This was found to be concordant by Dr. Kristopher Oppenheim. Pathology revealed GRADE 2 INVASIVE DUCTAL CARCINOMA of the RIGHT breast, 12:30 o'clock, 4 cmfn (heart clip). This was found to be concordant by Dr. Kristopher Oppenheim. Pathology revealed GRADE 2 INVASIVE DUCTAL CARCINOMA of the LEFT breast, 1:00 o'clock, 3 cmfn (ribbon clip). This was found to be concordant by Dr. Kristopher Oppenheim. Pathology results were discussed with the patient and husband, Katie Jacobson by telephone. The patient reported doing well after the biopsies with tenderness at the sites. Post biopsy instructions and care were reviewed and questions were answered. The patient was encouraged to call [facility] for any additional concerns. The patient is scheduled with Dr. [facility] at [facility] on October 13, 2022. Surgical consultation has been tentatively arranged with Dr. Rolm Jacobson at [facility] on October 28, 2022, per patient request. Pathology results reported by Stacie Acres RN on 10/05/2022. Electronically Signed   By: Kristopher Oppenheim M.D.   On: 10/11/2022 08:03   Result Date: 10/11/2022 CLINICAL DATA:  67 year old female with suspicious bilateral breast masses. EXAM: ULTRASOUND GUIDED BILATERAL BREAST CORE NEEDLE BIOPSY COMPARISON:  Previous exam(s). PROCEDURE: I met with the patient and we discussed the procedure of ultrasound-guided biopsy, including benefits and alternatives. We discussed the high likelihood of a successful procedure. We discussed the risks of the procedure, including infection, bleeding, tissue injury, clip migration, and inadequate sampling. Informed written consent was given. The usual time-out protocol was performed immediately prior to the procedure. Lesion quadrant: Upper outer quadrant Using sterile technique and 1% Lidocaine as local anesthetic, under direct ultrasound visualization, a 14 gauge spring-loaded device was used to perform biopsy of a mass at the 12 o'clock position 1 cm from the nipple using a lateral approach. At the conclusion of the procedure a ribbon shaped tissue marker clip was deployed into the biopsy cavity. Lesion quadrant: Upper inner quadrant Using sterile technique and 1% Lidocaine as local anesthetic, under direct ultrasound visualization, a  14 gauge spring-loaded device was used to perform biopsy of a mass at the 12:30 position 4 cm from the nipple using a lateral approach. At the conclusion of the procedure a heart shaped tissue marker clip was deployed into the biopsy cavity. Lesion quadrant: Upper outer quadrant Using sterile technique and 1% Lidocaine as local anesthetic, under direct ultrasound visualization, a 14 gauge spring-loaded device was used to perform biopsy of a mass at the 1 o'clock position 3 cm from the nipple using a inferolateral approach. At the conclusion of the procedure a ribbon shaped tissue marker clip was deployed  into the biopsy cavity. Follow up 2 view mammogram was performed and dictated separately. IMPRESSION: Ultrasound guided biopsy of the bilateral breasts. No apparent complications. Electronically Signed: By: Kristopher Oppenheim M.D. On: 09/30/2022 14:27  [place] LT BREAST BX W LOC DEV 1ST LESION IMG BX SPEC US GUIDE  Addendum Date: 10/11/2022   ADDENDUM REPORT: 10/11/2022 08:03 ADDENDUM: Pathology revealed GRADE 2 INVASIVE DUCTAL CARCINOMA of the RIGHT breast, 12 o'clock, 1 cmfn (ribbon clip). This was found to be concordant by Dr. Kristopher Oppenheim. Pathology revealed GRADE 2 INVASIVE DUCTAL CARCINOMA of the RIGHT breast, 12:30 o'clock, 4 cmfn (heart clip). This was found to be concordant by Dr. Kristopher Oppenheim. Pathology revealed GRADE 2 INVASIVE DUCTAL CARCINOMA of the LEFT breast, 1:00 o'clock, 3 cmfn (ribbon clip). This was found to be concordant by Dr. Kristopher Oppenheim. Pathology results were discussed with the patient and husband, Katie Jacobson by telephone. The patient reported doing well after the biopsies with tenderness at the sites. Post biopsy instructions and care were reviewed and questions were answered. The patient was encouraged to call [facility] for any additional concerns. The patient is scheduled with Dr. Benay Jacobson at [facility] on October 13, 2022. Surgical consultation has been tentatively arranged with Dr. Rolm Jacobson at [facility] on October 28, 2022, per patient request. Pathology results reported by Stacie Acres RN on 10/05/2022. Electronically Signed   By: Kristopher Oppenheim M.D.   On: 10/11/2022 08:03   Result Date: 10/11/2022 CLINICAL DATA:  67 year old female with suspicious bilateral breast masses. EXAM: ULTRASOUND GUIDED BILATERAL BREAST CORE NEEDLE BIOPSY COMPARISON:  Previous exam(s). PROCEDURE: I met with the patient and we discussed the procedure of ultrasound-guided biopsy, including benefits and alternatives. We discussed  the high likelihood of a successful procedure. We discussed the risks of the procedure, including infection, bleeding, tissue injury, clip migration, and inadequate sampling. Informed written consent was given. The usual time-out protocol was performed immediately prior to the procedure. Lesion quadrant: Upper outer quadrant Using sterile technique and 1% Lidocaine as local anesthetic, under direct ultrasound visualization, a 14 gauge spring-loaded device was used to perform biopsy of a mass at the 12 o'clock position 1 cm from the nipple using a lateral approach. At the conclusion of the procedure a ribbon shaped tissue marker clip was deployed into the biopsy cavity. Lesion quadrant: Upper inner quadrant Using sterile technique and 1% Lidocaine as local anesthetic, under direct ultrasound visualization, a 14 gauge spring-loaded device was used to perform biopsy of a mass at the 12:30 position 4 cm from the nipple using a lateral approach. At the conclusion of the procedure a heart shaped tissue marker clip was deployed into the biopsy cavity. Lesion quadrant: Upper outer quadrant Using sterile technique and 1% Lidocaine as local anesthetic, under direct ultrasound visualization, a 14 gauge spring-loaded device was used to perform  biopsy of a mass at the 1 o'clock position 3 cm from the nipple using a inferolateral approach. At the conclusion of the procedure a ribbon shaped tissue marker clip was deployed into the biopsy cavity. Follow up 2 view mammogram was performed and dictated separately. IMPRESSION: Ultrasound guided biopsy of the bilateral breasts. No apparent complications. Electronically Signed: By: Kristopher Oppenheim M.D. On: 09/30/2022 14:27  MM CLIP PLACEMENT RIGHT  Result Date: 09/30/2022 CLINICAL DATA:  Status post bilateral breast biopsy. EXAM: 3D DIAGNOSTIC BILATERAL MAMMOGRAM POST ULTRASOUND BIOPSY COMPARISON:  Previous exam(s). FINDINGS: 3D Mammographic images were obtained following ultrasound  guided biopsy of the bilateral breasts. All 3 biopsy marking clips are in the expected positions. IMPRESSION: Appropriate positioning of all 3 post biopsy marking clips in the bilateral breasts. Final Assessment: Post Procedure Mammograms for Marker Placement Electronically Signed   By: Kristopher Oppenheim M.D.   On: 09/30/2022 14:46  MM CLIP PLACEMENT LEFT  Result Date: 09/30/2022 CLINICAL DATA:  Status post bilateral breast biopsy. EXAM: 3D DIAGNOSTIC BILATERAL MAMMOGRAM POST ULTRASOUND BIOPSY COMPARISON:  Previous exam(s). FINDINGS: 3D Mammographic images were obtained following ultrasound guided biopsy of the bilateral breasts. All 3 biopsy marking clips are in the expected positions. IMPRESSION: Appropriate positioning of all 3 post biopsy marking clips in the bilateral breasts. Final Assessment: Post Procedure Mammograms for Marker Placement Electronically Signed   By: Kristopher Oppenheim M.D.   On: 09/30/2022 14:46  MM DIAG BREAST TOMO BILATERAL  Result Date: 09/19/2022 CLINICAL DATA:  Screening recall for possible right breast mass, possible right breast asymmetry and possible left breast asymmetry. Patient has a history of a right lumpectomy for breast carcinoma performed in 2016. EXAM: DIGITAL DIAGNOSTIC BILATERAL MAMMOGRAM WITH TOMOSYNTHESIS; ULTRASOUND RIGHT BREAST LIMITED; ULTRASOUND LEFT BREAST LIMITED TECHNIQUE: Bilateral digital diagnostic mammography and breast tomosynthesis was performed.; Targeted ultrasound examination of the right breast was performed; Targeted ultrasound examination of the left breast was performed. COMPARISON:  Previous exam(s). ACR Breast Density Category b: There are scattered areas of fibroglandular density. FINDINGS: On diagnostic imaging, the possible mass noted in the patient's posterior, upper right breast at the lumpectomy bed, persists as a round, irregular mass, approximately 1.4 cm in size, with associated architectural distortion. Anterior to this, in upper right  breast, the small asymmetry noted on the screening exam also persists as an 8 mm irregular focal opacity, best appreciated on the MLO and mL images. There is linear opacity that extends from this focal asymmetry to the lumpectomy bed. On the left, the possible asymmetry persists as a small irregular mass with associated distortion in the central upper outer quadrant near 1 o'clock, mass measuring 7 mm in size. On physical exam, there is firmness in the upper right breast which could reflect a mass or be due to the post lumpectomy scarring. Targeted right breast ultrasound is performed, showing a hypoechoic irregular mass with dense posterior acoustic shadowing in posterior breast at 12:30 o'clock, 4 cm the nipple, measuring 1.7 x 1.2 x 1.5 cm, consistent with the mammographic mass seen the lumpectomy bed. Anterior to this, in the right breast at 12 o'clock, 1 cm from the nipple, there is a subtle hypoechoic mass measuring 1.0 x 0.6 x 0.8 cm. This is consistent in size and position to the focal mammographic asymmetry. Sonographic evaluation of the right axilla shows no enlarged or abnormal lymph nodes. Targeted left breast ultrasound is performed, showing hypoechoic irregular mass partly ill-defined margins at 1 o'clock, 3 cm from nipple, measuring 0.8  x 0.7 x 0.8 cm. This is consistent in size and position to mammographic mass. Sonographic imaging of the left axilla demonstrates normal lymph nodes. There are no enlarged or abnormal lymph nodes. IMPRESSION: 1. Irregular 1.7 cm mass in the posterior upper right breast at the lumpectomy bed highly suspicious for malignancy. 2. Subtle 1 cm mass right breast at 12 o'clock, 1 cm from the nipple, consistent with the focal asymmetry noted mammographically, suspicious for a second focus of malignancy. 3. 8 mm hypoechoic irregular mass in the left breast at 1 o'clock, 3 cm the corresponding to the irregular mass seen mammographically, small suspicious for malignancy.  RECOMMENDATION: 1. Ultrasound-guided core needle biopsy of the 1.7 cm right breast mass 12:30 o'clock. 2. Ultrasound-guided core needle biopsy of the subtle right breast mass at 12 o'clock, 1 cm from nipple. If this mass cannot be confidently visualize of time biopsy, or the post biopsy marker this not correlate to the mammographic abnormality, and stereotactic needle biopsy of this lesion be indicated. 3. Ultrasound-guided core needle biopsy of the 8 mm left breast mass at 1 o'clock. I have discussed the findings and recommendations with the patient. If applicable, a reminder letter will be sent to the patient regarding the next appointment. BI-RADS CATEGORY  5: Highly suggestive of malignancy. Electronically Signed   By: Lajean Manes M.D.   On: 09/19/2022 16:27  US BREAST LTD UNI LEFT INC AXILLA  Result Date: 09/19/2022 CLINICAL DATA:  Screening recall for possible right breast mass, possible right breast asymmetry and possible left breast asymmetry. Patient has a history of a right lumpectomy for breast carcinoma performed in 2016. EXAM: DIGITAL DIAGNOSTIC BILATERAL MAMMOGRAM WITH TOMOSYNTHESIS; ULTRASOUND RIGHT BREAST LIMITED; ULTRASOUND LEFT BREAST LIMITED TECHNIQUE: Bilateral digital diagnostic mammography and breast tomosynthesis was performed.; Targeted ultrasound examination of the right breast was performed; Targeted ultrasound examination of the left breast was performed. COMPARISON:  Previous exam(s). ACR Breast Density Category b: There are scattered areas of fibroglandular density. FINDINGS: On diagnostic imaging, the possible mass noted in the patient's posterior, upper right breast at the lumpectomy bed, persists as a round, irregular mass, approximately 1.4 cm in size, with associated architectural distortion. Anterior to this, in upper right breast, the small asymmetry noted on the screening exam also persists as an 8 mm irregular focal opacity, best appreciated on the MLO and mL images.  There is linear opacity that extends from this focal asymmetry to the lumpectomy bed. On the left, the possible asymmetry persists as a small irregular mass with associated distortion in the central upper outer quadrant near 1 o'clock, mass measuring 7 mm in size. On physical exam, there is firmness in the upper right breast which could reflect a mass or be due to the post lumpectomy scarring. Targeted right breast ultrasound is performed, showing a hypoechoic irregular mass with dense posterior acoustic shadowing in posterior breast at 12:30 o'clock, 4 cm the nipple, measuring 1.7 x 1.2 x 1.5 cm, consistent with the mammographic mass seen the lumpectomy bed. Anterior to this, in the right breast at 12 o'clock, 1 cm from the nipple, there is a subtle hypoechoic mass measuring 1.0 x 0.6 x 0.8 cm. This is consistent in size and position to the focal mammographic asymmetry. Sonographic evaluation of the right axilla shows no enlarged or abnormal lymph nodes. Targeted left breast ultrasound is performed, showing hypoechoic irregular mass partly ill-defined margins at 1 o'clock, 3 cm from nipple, measuring 0.8 x 0.7 x 0.8 cm. This is consistent  in size and position to mammographic mass. Sonographic imaging of the left axilla demonstrates normal lymph nodes. There are no enlarged or abnormal lymph nodes. IMPRESSION: 1. Irregular 1.7 cm mass in the posterior upper right breast at the lumpectomy bed highly suspicious for malignancy. 2. Subtle 1 cm mass right breast at 12 o'clock, 1 cm from the nipple, consistent with the focal asymmetry noted mammographically, suspicious for a second focus of malignancy. 3. 8 mm hypoechoic irregular mass in the left breast at 1 o'clock, 3 cm the corresponding to the irregular mass seen mammographically, small suspicious for malignancy. RECOMMENDATION: 1. Ultrasound-guided core needle biopsy of the 1.7 cm right breast mass 12:30 o'clock. 2. Ultrasound-guided core needle biopsy of the subtle  right breast mass at 12 o'clock, 1 cm from nipple. If this mass cannot be confidently visualize of time biopsy, or the post biopsy marker this not correlate to the mammographic abnormality, and stereotactic needle biopsy of this lesion be indicated. 3. Ultrasound-guided core needle biopsy of the 8 mm left breast mass at 1 o'clock. I have discussed the findings and recommendations with the patient. If applicable, a reminder letter will be sent to the patient regarding the next appointment. BI-RADS CATEGORY  5: Highly suggestive of malignancy. Electronically Signed   By: Lajean Manes M.D.   On: 09/19/2022 16:27  US BREAST LTD UNI RIGHT INC AXILLA  Result Date: 09/19/2022 CLINICAL DATA:  Screening recall for possible right breast mass, possible right breast asymmetry and possible left breast asymmetry. Patient has a history of a right lumpectomy for breast carcinoma performed in 2016. EXAM: DIGITAL DIAGNOSTIC BILATERAL MAMMOGRAM WITH TOMOSYNTHESIS; ULTRASOUND RIGHT BREAST LIMITED; ULTRASOUND LEFT BREAST LIMITED TECHNIQUE: Bilateral digital diagnostic mammography and breast tomosynthesis was performed.; Targeted ultrasound examination of the right breast was performed; Targeted ultrasound examination of the left breast was performed. COMPARISON:  Previous exam(s). ACR Breast Density Category b: There are scattered areas of fibroglandular density. FINDINGS: On diagnostic imaging, the possible mass noted in the patient's posterior, upper right breast at the lumpectomy bed, persists as a round, irregular mass, approximately 1.4 cm in size, with associated architectural distortion. Anterior to this, in upper right breast, the small asymmetry noted on the screening exam also persists as an 8 mm irregular focal opacity, best appreciated on the MLO and mL images. There is linear opacity that extends from this focal asymmetry to the lumpectomy bed. On the left, the possible asymmetry persists as a small irregular mass  with associated distortion in the central upper outer quadrant near 1 o'clock, mass measuring 7 mm in size. On physical exam, there is firmness in the upper right breast which could reflect a mass or be due to the post lumpectomy scarring. Targeted right breast ultrasound is performed, showing a hypoechoic irregular mass with dense posterior acoustic shadowing in posterior breast at 12:30 o'clock, 4 cm the nipple, measuring 1.7 x 1.2 x 1.5 cm, consistent with the mammographic mass seen the lumpectomy bed. Anterior to this, in the right breast at 12 o'clock, 1 cm from the nipple, there is a subtle hypoechoic mass measuring 1.0 x 0.6 x 0.8 cm. This is consistent in size and position to the focal mammographic asymmetry. Sonographic evaluation of the right axilla shows no enlarged or abnormal lymph nodes. Targeted left breast ultrasound is performed, showing hypoechoic irregular mass partly ill-defined margins at 1 o'clock, 3 cm from nipple, measuring 0.8 x 0.7 x 0.8 cm. This is consistent in size and position to mammographic mass. Sonographic  imaging of the left axilla demonstrates normal lymph nodes. There are no enlarged or abnormal lymph nodes. IMPRESSION: 1. Irregular 1.7 cm mass in the posterior upper right breast at the lumpectomy bed highly suspicious for malignancy. 2. Subtle 1 cm mass right breast at 12 o'clock, 1 cm from the nipple, consistent with the focal asymmetry noted mammographically, suspicious for a second focus of malignancy. 3. 8 mm hypoechoic irregular mass in the left breast at 1 o'clock, 3 cm the corresponding to the irregular mass seen mammographically, small suspicious for malignancy. RECOMMENDATION: 1. Ultrasound-guided core needle biopsy of the 1.7 cm right breast mass 12:30 o'clock. 2. Ultrasound-guided core needle biopsy of the subtle right breast mass at 12 o'clock, 1 cm from nipple. If this mass cannot be confidently visualize of time biopsy, or the post biopsy marker this not correlate  to the mammographic abnormality, and stereotactic needle biopsy of this lesion be indicated. 3. Ultrasound-guided core needle biopsy of the 8 mm left breast mass at 1 o'clock. I have discussed the findings and recommendations with the patient. If applicable, a reminder letter will be sent to the patient regarding the next appointment. BI-RADS CATEGORY  5: Highly suggestive of malignancy. Electronically Signed   By: Lajean Manes M.D.   On: 09/19/2022 16:27    ASSESSMENT: 67 y.o. [place], [place] woman status post right breast upper outer quadrant biopsy 11/12/2014 for a clinical T1c N0, stage IA invasive ductal carcinoma, triple negative, with an MIB-1 of 19%  (1) neoadjuvant chemotherapy consisting of cyclophosphamide and docetaxel 4 given every 21 days with Neulasta support, first dose 12/02/2014, completed 02/03/2015  (2) genetics testing through the [facility] gene panel at [facility] showed no deleterious mutations in ATM, BARD1, BRCA1, BRCA2, BRIP1, CDH1, CHEK2, MRE11A, MUTYH, NBN, NF1, PALB2, PTEN, RAD50, RAD51C, RAD51D, oe TP53.  (3) status post right lumpectomy and sentinel node sampling 03/02/2015 for a ypT1b ypN0 stage IA invasive ductal carcinoma, grade 1, with negative margins. Repeat HER-2 was again not amplified.  (4) radiation therapy completed August 2016  5. Mammogram  abnormal in Nov 2022, requiring additional biopsies, path neg for malignancy  6.  Since her last visit here, she had mammogram in November which showed asymmetry in the left breast and possible mass and asymmetry in the right breast warranting further evaluation. Ultrasound and biopsies showed invasive ductal carcinoma of the right breast, grade 2 at 12:00 1 cm from the nipple, invasive ductal carcinoma grade 2 at 1230 4 cm from the nipple, invasive ductal carcinoma of the left breast at 1:00 3 cm from the nipple overall grade 2.  Prognostic showed ER 2% positive weak staining, PR 0% negative, Ki-67 of  50% and HER2 negative from the right breast 12 o'clock position mass.  Left breast biopsy prognostics showed ER 0% negative PR 0% negative Ki-67 of 95% and HER2 negative  PLAN  Today we have reviewed her imaging, pathology results in detail.  Given bilateral breast cancer and history of breast cancer in the right breast with previous lumpectomy and radiation in 2016, we have recommended bilateral mastectomy.  Medically she will not need a mastectomy on the left side however given the asymmetry that will result from right sided mastectomy and left lumpectomy, it may not be unreasonable to proceed with bilateral mastectomy and consider reconstruction later.  With regards to chemotherapy, given near triple negative disease bilaterally, I think she will definitely benefit from chemotherapy.  Neoadjuvant or adjuvant.  If she desires to proceed  with bilateral mastectomy, we may just consider adjuvant chemo.  In the past when she received TC chemotherapy, she had a lot of adverse effects and she is very worried about having to go through chemotherapy again.  At this time her options are possibly repeat TC versus consider CMF.  CMF will be inferior to TC in terms of outcomes.  I do not believe she is a candidate for Adriamycin based therapy. She will return to clinic in a few weeks after surgery.  I discussed the case with Dr. Donne Hazel.  I think it is reasonable to consider imaging given her bilateral disease and questionable recurrence versus new triple negative biology.  I will order this today.  Total time spent: 45 minutes  *Total Encounter Time as defined by the Centers for Medicare and Medicaid Services includes, in addition to the face-to-face time of a patient visit (documented in the note above) non-face-to-face time: obtaining and reviewing outside history, ordering and reviewing medications, tests or procedures, care coordination (communications with other health care professionals or caregivers) and  documentation in the medical record.

## 2022-10-14 ENCOUNTER — Other Ambulatory Visit: Payer: Self-pay | Admitting: General Surgery

## 2022-10-14 ENCOUNTER — Encounter: Payer: Self-pay | Admitting: Hematology and Oncology

## 2022-10-14 DIAGNOSIS — C50411 Malignant neoplasm of upper-outer quadrant of right female breast: Secondary | ICD-10-CM | POA: Diagnosis not present

## 2022-10-14 DIAGNOSIS — Z171 Estrogen receptor negative status [ER-]: Secondary | ICD-10-CM

## 2022-10-14 DIAGNOSIS — C50412 Malignant neoplasm of upper-outer quadrant of left female breast: Secondary | ICD-10-CM | POA: Diagnosis not present

## 2022-10-18 ENCOUNTER — Encounter: Payer: Self-pay | Admitting: *Deleted

## 2022-10-24 ENCOUNTER — Ambulatory Visit (HOSPITAL_COMMUNITY)
Admission: RE | Admit: 2022-10-24 | Discharge: 2022-10-24 | Disposition: A | Discharge status: Home / Self Care | Payer: 59 | Source: Ambulatory Visit | Attending: Hematology and Oncology | Admitting: Hematology and Oncology

## 2022-10-24 ENCOUNTER — Encounter (HOSPITAL_COMMUNITY): Payer: Self-pay

## 2022-10-24 DIAGNOSIS — C50411 Malignant neoplasm of upper-outer quadrant of right female breast: Secondary | ICD-10-CM | POA: Diagnosis not present

## 2022-10-24 DIAGNOSIS — Z171 Estrogen receptor negative status [ER-]: Secondary | ICD-10-CM | POA: Insufficient documentation

## 2022-10-24 MED ORDER — IOHEXOL 300 MG/ML  SOLN
100.0000 mL | Freq: Once | INTRAMUSCULAR | Status: AC | PRN
  Administered 2022-10-24: 100 mL via INTRAVENOUS

## 2022-10-24 MED ORDER — SODIUM CHLORIDE (PF) 0.9 % IJ SOLN
INTRAMUSCULAR | Status: AC
  Filled 2022-10-24: qty 50

## 2022-10-26 ENCOUNTER — Encounter: Payer: Self-pay | Admitting: *Deleted

## 2022-10-27 ENCOUNTER — Encounter (HOSPITAL_COMMUNITY)
Admission: RE | Admit: 2022-10-27 | Discharge: 2022-10-27 | Disposition: A | Discharge status: Home / Self Care | Payer: 59 | Source: Ambulatory Visit | Attending: Hematology and Oncology | Admitting: Hematology and Oncology

## 2022-10-27 DIAGNOSIS — C50411 Malignant neoplasm of upper-outer quadrant of right female breast: Secondary | ICD-10-CM | POA: Diagnosis not present

## 2022-10-27 DIAGNOSIS — Z171 Estrogen receptor negative status [ER-]: Secondary | ICD-10-CM | POA: Insufficient documentation

## 2022-10-27 MED ORDER — TECHNETIUM TC 99M MEDRONATE IV KIT
20.0000 | PACK | Freq: Once | INTRAVENOUS | Status: AC | PRN
  Administered 2022-10-27: 19 via INTRAVENOUS

## 2022-10-28 ENCOUNTER — Other Ambulatory Visit: Payer: Self-pay | Admitting: General Surgery

## 2022-10-31 NOTE — Progress Notes (Signed)
Subjective:   By signing my name below, I, Katie Jacobson, attest that this documentation has been prepared under the direction and in the presence of Nance Pear, NP 11/01/2022   Patient ID: Katie Jacobson, female    DOB: [birth year], 67 y.o.   MRN: [id number]  Chief Complaint  Patient presents with   Hypertension    Here for follow up   Breast Cancer    New diagnosis bilateral breast cancer. Diagnosed 10/13/22    HPI Patient is in today for a 6 month follow up.   Breast cancer diagnosis: Abnormalities were found in both breasts on her routine mammogram. Results were consistent with breast cancer. She reported that it is a small area on the left breast and 2 large areas on the right breast. She is currently reviewing treatment options with her doctors.  Due to prior breast cancer diagnosis, she is unable to receive radiation in her right breast. She mentions a potential mastectomy in her right breast and lumpectomy in her left breast. She notes that she was told her lymph nodes were clear in her left breast. She plans to seek a second opinion for oncology. ***   Blood pressure:  She reports that recently her blood pressure has been running low and attributes her high blood pressure in clinic today to her recent life stressors.   BP Readings from Last 3 Encounters:  11/01/22 (!) 150/76  10/13/22 124/76  08/23/22 126/60   Health maintenance:  She has not needed to use her albuterol in a couple of years. She believes the albuterol she currently has may even be expired.   Immunizations:  She is UTD on the latest flu vaccine.   Past Medical History:  Diagnosis Date   Allergy    Breast cancer of upper-outer quadrant of right female breast ([place]) 11/14/2014   Treated with chemotherapy and radiation   History of chemotherapy    finished chemo 02/03/2015   History of kidney stones    History of seizures    as a child - unknown cause - states was never on anticonvulsants    Hypertension    states under control with meds., has been on med. x "years"   Mitral valve prolapse 1990   Follow-up echocardiogram not show mitral prolapse.  Shows mild mitral vegetation.   Osteoporosis    Personal history of chemotherapy    Personal history of radiation therapy    Seasonal allergies     Past Surgical History:  Procedure Laterality Date   ABDOMINAL HYSTERECTOMY  1998   partial   APPENDECTOMY  1976   BREAST BIOPSY Right 09/30/2022   [place] RT BREAST BX W LOC DEV 1ST LESION IMG BX SPEC US GUIDE 09/30/2022 [facility]   BREAST BIOPSY Left 09/30/2022   [place] LT BREAST BX W LOC DEV 1ST LESION IMG BX SPEC US GUIDE 09/30/2022 [facility]   BREAST BIOPSY Right 09/30/2022   [place] RT BREAST BX W LOC DEV EA ADD LESION IMG BX SPEC US GUIDE 09/30/2022 [facility]   BREAST LUMPECTOMY Right 02/2015   COLONOSCOPY  09/30/2016   KNEE ARTHROSCOPY Left 03/26/2008   PLANTAR'S WART EXCISION Left    x 2   PORT-A-CATH REMOVAL Left 04/06/2015   Procedure: REMOVAL PORT-A-CATH;  Surgeon: Rolm Bookbinder, MD;  Location: [place];  Service: General;  Laterality: Left;   PORTACATH PLACEMENT N/A 11/27/2014   Procedure: INSERTION PORT-A-CATH;  Surgeon: Rolm Bookbinder, MD;  Location: [facility];  Service: General;  Laterality: N/A;   RADIOACTIVE SEED GUIDED PARTIAL MASTECTOMY WITH AXILLARY SENTINEL LYMPH NODE BIOPSY Right 03/02/2015   Procedure: RADIOACTIVE SEED GUIDED RIGHT PARTIAL MASTECTOMY WITH RIGHT AXILLARY SENTINEL LYMPH NODE BIOPSY;  Surgeon: Rolm Bookbinder, MD;  Location: [place];  Service: General;  Laterality: Right;   SALIVARY STONE REMOVAL Right 01/09/1971   TONSILLECTOMY  1970s   TRANSTHORACIC ECHOCARDIOGRAM  01/16/2020   EF 60 to 65%.  GRII DD.  No R WMA.  Normal RV size and function.  Rheumatic mitral valve with moderate thickening-hockey-stick appearing.  Moderate MR with mild MS.  Moderate LA dilation.  (Recommend  follow-up in 2 to 3 years)   TRANSTHORACIC ECHOCARDIOGRAM  05/31/2022   Normal LV size and function-EF 60 to 65%.  No RWMA. ??  Normal diastolic parameters but elevated LAP?  Normal RV with normal RVP and RAP.  Manage mitral valve with mild MR and no MS.  Mild to moderate AI with aortic sclerosis but no stenosis.   TUBAL LIGATION  1996    Family History  Problem Relation Age of Onset   Heart disease Mother    Stroke Mother    Hypertension Mother    Diabetes Mother    Diabetes Sister    Kidney disease Brother    Mental retardation Brother    Diabetes Sister    Heart attack Father    Breast cancer Cousin        deceased 60   Colon polyps Daughter    Colon cancer Neg Hx    Esophageal cancer Neg Hx    Rectal cancer Neg Hx    Stomach cancer Neg Hx     Social History   Socioeconomic History   Marital status: Married    Spouse name: Not on file   Number of children: 2   Years of education: Not on file   Highest education level: Not on file  Occupational History   Not on file  Tobacco Use   Smoking status: Every Day    Packs/day: 0.25    Years: 43.00    Total pack years: 10.75    Types: Cigarettes   Smokeless tobacco: Former    Quit date: 09/09/2016   Tobacco comments:    10 cig./day  Vaping Use   Vaping Use: Never used  Substance and Sexual Activity   Alcohol use: Yes    Alcohol/week: 5.0 standard drinks of alcohol    Types: 5 Standard drinks or equivalent per week    Comment: occ    Drug use: No   Sexual activity: Yes    Partners: Male  Other Topics Concern   Not on file  Social History Narrative   Married with 2 daughters- born 71 and 32 ([name] died in [place] January 09, 2016)   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  Has been doing part time work with [employer] -- had 6 active clients, but the most important one is her Daughter.   Regular exercise:  No   Caffeine Use: none; Current 1ppd smoker x > 39 yrs; EtOH ~4 oz/week    Social Determinants of Health   Financial Resource Strain: Not on file  Food Insecurity: Not on file  Transportation Needs: Not on file  Physical Activity: Not on file  Stress: Not on file  Social Connections: Not on file  Intimate Partner Violence: Not on file    Outpatient Medications Prior to Visit  Medication Sig Dispense Refill   albuterol (VENTOLIN HFA) 108 (90 Base) MCG/ACT inhaler Inhale 1-2 puffs into the lungs every 6 (six) hours as needed for wheezing or shortness of breath. 1 each 0   aspirin 81 MG tablet Take 1 tablet (81 mg total) by mouth daily. 30 tablet 0   fluticasone (FLONASE) 50 MCG/ACT nasal spray Place 2 sprays into both nostrils as needed. 16 g 0   chlorthalidone (HYGROTON) 25 MG tablet TAKE 1 TABLET(25 MG) BY MOUTH EVERY OTHER DAY 45 tablet 3   COVID-19 mRNA vaccine 2023-2024 (COMIRNATY) syringe Inject into the muscle. 0.3 mL 0   enalapril (VASOTEC) 20 MG tablet TAKE 1 TABLET(20 MG) BY MOUTH AT BEDTIME 90 tablet 2   metoprolol succinate (TOPROL-XL) 100 MG 24 hr tablet Take 1 tablet (100 mg total) by mouth daily. Take with or immediately following a meal. 90 tablet 3   No facility-administered medications prior to visit.    Allergies  Allergen Reactions   Sulfa Antibiotics Other (See Comments)    UNKNOWN    Review of Systems  Constitutional:  Negative for fever.       (-)unexpected weight change (-)Adenopathy  HENT:  Negative for congestion, sinus pain and sore throat.   Eyes:        (-)Visual disturbance  Respiratory:  Negative for cough, shortness of breath and wheezing.   Cardiovascular:  Negative for chest pain, palpitations and leg swelling.  Gastrointestinal:  Negative for blood in stool, constipation, diarrhea, nausea and vomiting.  Genitourinary:  Negative for dysuria, frequency and hematuria.  Musculoskeletal:        (-)new muscle pain (-)new joint pain  Skin:        (-)new moles  Neurological:  Negative for dizziness and headaches.   Psychiatric/Behavioral:  Negative for depression. The patient is not nervous/anxious.        Objective:    Physical Exam Constitutional:      General: She is awake. She is not in acute distress.    Appearance: Normal appearance. She is not ill-appearing.  HENT:     Head: Normocephalic and atraumatic.     Right Ear: External ear normal.     Left Ear: External ear normal.  Eyes:     Extraocular Movements: Extraocular movements intact.     Pupils: Pupils are equal, round, and reactive to light.  Cardiovascular:     Rate and Rhythm: Normal rate and regular rhythm.     Heart sounds: Normal heart sounds. No murmur heard.    No gallop.  Pulmonary:     Effort: Pulmonary effort is normal. No respiratory distress.     Breath sounds: Normal breath sounds. No wheezing or rales.  Skin:    General: Skin is warm and dry.  Neurological:     Mental Status: She is alert and oriented to person, place, and time.     Cranial Nerves: No facial asymmetry.  Psychiatric:        Mood and Affect: Mood normal.        Speech: Speech normal.        Judgment: Judgment normal.     BP (!) 150/76 (BP Location: Left Arm, Patient Position: Sitting, Cuff Size: Large)   Pulse 74   Temp 98.6 F (37 C) (Oral)   Resp 16   Wt 226 lb (102.5 kg)   LMP 10/10/1996   SpO2 100%   BMI 41.34 kg/m  Wt Readings from Last 3 Encounters:  11/01/22 226 lb (  102.5 kg)  10/13/22 221 lb 4.8 oz (100.4 kg)  08/23/22 225 lb 3.2 oz (102.2 kg)    Diabetic Foot Exam - Simple   No data filed    Lab Results  Component Value Date   WBC 6.8 08/16/2021   HGB 13.4 08/16/2021   HCT 40.2 08/16/2021   PLT 206 08/16/2021   GLUCOSE 88 01/28/2022   CHOL 209 (H) 08/10/2018   TRIG 73 08/10/2018   HDL 76 08/10/2018   LDLCALC 116 (H) 08/10/2018   ALT 14 01/28/2022   AST 13 01/28/2022   NA 142 01/28/2022   K 4.1 01/28/2022   CL 106 01/28/2022   CREATININE 0.90 01/28/2022   BUN 14 01/28/2022   CO2 28 01/28/2022   TSH 0.83  10/15/2019    Lab Results  Component Value Date   TSH 0.83 10/15/2019   Lab Results  Component Value Date   WBC 6.8 08/16/2021   HGB 13.4 08/16/2021   HCT 40.2 08/16/2021   MCV 94.6 08/16/2021   PLT 206 08/16/2021   Lab Results  Component Value Date   NA 142 01/28/2022   K 4.1 01/28/2022   CHLORIDE 102 12/28/2016   CO2 28 01/28/2022   GLUCOSE 88 01/28/2022   BUN 14 01/28/2022   CREATININE 0.90 01/28/2022   BILITOT 0.4 01/28/2022   ALKPHOS 74 08/16/2021   AST 13 01/28/2022   ALT 14 01/28/2022   PROT 6.6 01/28/2022   ALBUMIN 3.4 (L) 08/16/2021   CALCIUM 9.7 01/28/2022   ANIONGAP 9 08/16/2021   EGFR 78 (L) 12/28/2016   GFR 88.35 09/06/2016   Lab Results  Component Value Date   CHOL 209 (H) 08/10/2018   Lab Results  Component Value Date   HDL 76 08/10/2018   Lab Results  Component Value Date   LDLCALC 116 (H) 08/10/2018   Lab Results  Component Value Date   TRIG 73 08/10/2018   Lab Results  Component Value Date   CHOLHDL 2.8 08/10/2018   No results found for: "HGBA1C"     Assessment & Plan:   Problem List Items Addressed This Visit       Cardiovascular and Mediastinum   Essential hypertension - Primary (Chronic)    BP Readings from Last 3 Encounters:  11/01/22 (!) 150/76  10/13/22 124/76  08/23/22 126/60  Pt is maintained on chlorthalidone, toprol xl and   Wt Readings from Last 3 Encounters:  11/01/22 226 lb (102.5 kg)  10/13/22 221 lb 4.8 oz (100.4 kg)  08/23/22 225 lb 3.2 oz (102.2 kg)        Relevant Medications   metoprolol succinate (TOPROL-XL) 100 MG 24 hr tablet   enalapril (VASOTEC) 20 MG tablet   chlorthalidone (HYGROTON) 25 MG tablet   Other Relevant Orders   Comp Met (CMET)   Meds ordered this encounter  Medications   metoprolol succinate (TOPROL-XL) 100 MG 24 hr tablet    Sig: Take 1 tablet (100 mg total) by mouth daily. Take with or immediately following a meal.    Dispense:  90 tablet    Refill:  3    Order Specific  Question:   Supervising Provider    Answer:   Penni Homans A [[id number]]   enalapril (VASOTEC) 20 MG tablet    Sig: TAKE 1 TABLET(20 MG) BY MOUTH AT BEDTIME    Dispense:  90 tablet    Refill:  1    Order Specific Question:   Supervising Provider    Answer:  BLYTH, STACEY A [[id number]]   chlorthalidone (HYGROTON) 25 MG tablet    Sig: TAKE 1 TABLET(25 MG) BY MOUTH EVERY OTHER DAY    Dispense:  45 tablet    Refill:  1    Order Specific Question:   Supervising Provider    Answer:   Kingsley Plan, personally preformed the services described in this documentation.  All medical record entries made by the scribe were at my direction and in my presence.  I have reviewed the chart and discharge instructions (if applicable) and agree that the record reflects my personal performance and is accurate and complete. 11/01/2022  I,Rachel Rivera,acting as a scribe for Nance Pear, NP.,have documented all relevant documentation on the behalf of Nance Pear, NP,as directed by  Nance Pear, NP while in the presence of Nance Pear, NP.  ***  Katie Jacobson

## 2022-11-01 ENCOUNTER — Encounter: Payer: Self-pay | Admitting: Family

## 2022-11-01 ENCOUNTER — Ambulatory Visit (INDEPENDENT_AMBULATORY_CARE_PROVIDER_SITE_OTHER): Payer: 59 | Admitting: Family

## 2022-11-01 VITALS — BP 150/78 | HR 74 | Temp 98.6°F | Resp 16 | Wt 226.0 lb

## 2022-11-01 DIAGNOSIS — C50911 Malignant neoplasm of unspecified site of right female breast: Secondary | ICD-10-CM

## 2022-11-01 DIAGNOSIS — C50912 Malignant neoplasm of unspecified site of left female breast: Secondary | ICD-10-CM | POA: Diagnosis not present

## 2022-11-01 DIAGNOSIS — I10 Essential (primary) hypertension: Secondary | ICD-10-CM | POA: Diagnosis not present

## 2022-11-01 MED ORDER — METOPROLOL SUCCINATE ER 100 MG PO TB24: 100.0000 mg | ORAL_TABLET | Freq: Every day | ORAL | 3 refills | Status: DC

## 2022-11-01 MED ORDER — CHLORTHALIDONE 25 MG PO TABS: ORAL_TABLET | ORAL | 1 refills | Status: DC

## 2022-11-01 MED ORDER — ENALAPRIL MALEATE 20 MG PO TABS: ORAL_TABLET | ORAL | 1 refills | Status: DC

## 2022-11-01 NOTE — Assessment & Plan Note (Signed)
BP Readings from Last 3 Encounters:  11/01/22 (!) 150/76  10/13/22 124/76  08/23/22 126/60   Pt is maintained on chlorthalidone, toprol xl and enalapril.  BP is elevated today but was perfect on two recent checks. Will not adjust medications at this time.   Wt Readings from Last 3 Encounters:  11/01/22 226 lb (102.5 kg)  10/13/22 221 lb 4.8 oz (100.4 kg)  08/23/22 225 lb 3.2 oz (102.2 kg)

## 2022-11-02 ENCOUNTER — Encounter: Payer: Self-pay | Admitting: Family

## 2022-11-02 DIAGNOSIS — C50911 Malignant neoplasm of unspecified site of right female breast: Secondary | ICD-10-CM | POA: Insufficient documentation

## 2022-11-02 LAB — COMPREHENSIVE METABOLIC PANEL
ALT: 12 U/L (ref 0–35)
AST: 12 U/L (ref 0–37)
Albumin: 3.7 g/dL (ref 3.5–5.2)
Alkaline Phosphatase: 64 U/L (ref 39–117)
BUN: 13 mg/dL (ref 6–23)
CO2: 28 mEq/L (ref 19–32)
Calcium: 8.9 mg/dL (ref 8.4–10.5)
Chloride: 107 mEq/L (ref 96–112)
Creatinine, Ser: 0.84 mg/dL (ref 0.40–1.20)
GFR: 73.59 mL/min (ref >60.00)
Glucose, Bld: 80 mg/dL (ref 70–99)
Potassium: 3.9 mEq/L (ref 3.5–5.1)
Sodium: 143 mEq/L (ref 135–145)
Total Bilirubin: 0.5 mg/dL (ref 0.2–1.2)
Total Protein: 6.5 g/dL (ref 6.0–8.3)

## 2022-11-02 NOTE — Assessment & Plan Note (Signed)
We spoke at length about her diagnosis work up and plan as recommended by her oncologist and Psychologist, sport and exercise. She would like to proceed with a second opinion at [facility] but does plan to follow through with recommended surgical plan per Dr. Donne Hazel.

## 2022-11-02 NOTE — Progress Notes (Signed)
Letter mailed.

## 2022-11-03 ENCOUNTER — Telehealth: Payer: Self-pay | Admitting: Family

## 2022-11-03 NOTE — Telephone Encounter (Signed)
Patient notified of information. She was already called by Dr. Donne Hazel.

## 2022-11-03 NOTE — Telephone Encounter (Signed)
Please advise pt that I spoke to Dr. Donne Hazel.  He told me that it is very difficult to get in right now with the [facility].    He is recommending that you see Dr. Lindi Adie at [facility] for a second opinion. His office is going to reach out to her about that.  I already had sent a referral to [facility].  If they call her she can hold off on scheduling that appointment for now with them.

## 2022-11-04 ENCOUNTER — Telehealth: Payer: Self-pay | Admitting: Hematology and Oncology

## 2022-11-04 NOTE — Telephone Encounter (Signed)
Scheduled appointment per 1/26 staff message. Patient is aware of the made appointment.

## 2022-11-07 ENCOUNTER — Other Ambulatory Visit: Payer: Self-pay

## 2022-11-07 ENCOUNTER — Inpatient Hospital Stay (HOSPITAL_BASED_OUTPATIENT_CLINIC_OR_DEPARTMENT_OTHER): Payer: 59 | Admitting: Hematology and Oncology

## 2022-11-07 ENCOUNTER — Encounter: Payer: Self-pay | Admitting: *Deleted

## 2022-11-07 VITALS — BP 132/62 | HR 63 | Temp 97.2°F | Resp 14 | Wt 222.6 lb

## 2022-11-07 DIAGNOSIS — Z9049 Acquired absence of other specified parts of digestive tract: Secondary | ICD-10-CM | POA: Diagnosis not present

## 2022-11-07 DIAGNOSIS — Z87442 Personal history of urinary calculi: Secondary | ICD-10-CM | POA: Diagnosis not present

## 2022-11-07 DIAGNOSIS — Z7289 Other problems related to lifestyle: Secondary | ICD-10-CM | POA: Diagnosis not present

## 2022-11-07 DIAGNOSIS — I341 Nonrheumatic mitral (valve) prolapse: Secondary | ICD-10-CM | POA: Diagnosis not present

## 2022-11-07 DIAGNOSIS — Z171 Estrogen receptor negative status [ER-]: Secondary | ICD-10-CM | POA: Diagnosis not present

## 2022-11-07 DIAGNOSIS — Z90710 Acquired absence of both cervix and uterus: Secondary | ICD-10-CM | POA: Diagnosis not present

## 2022-11-07 DIAGNOSIS — C50811 Malignant neoplasm of overlapping sites of right female breast: Secondary | ICD-10-CM | POA: Diagnosis not present

## 2022-11-07 DIAGNOSIS — Z83719 Family history of colon polyps, unspecified: Secondary | ICD-10-CM | POA: Diagnosis not present

## 2022-11-07 DIAGNOSIS — Z882 Allergy status to sulfonamides status: Secondary | ICD-10-CM | POA: Diagnosis not present

## 2022-11-07 DIAGNOSIS — Z833 Family history of diabetes mellitus: Secondary | ICD-10-CM | POA: Diagnosis not present

## 2022-11-07 DIAGNOSIS — C50411 Malignant neoplasm of upper-outer quadrant of right female breast: Secondary | ICD-10-CM | POA: Diagnosis not present

## 2022-11-07 DIAGNOSIS — C50412 Malignant neoplasm of upper-outer quadrant of left female breast: Secondary | ICD-10-CM | POA: Diagnosis not present

## 2022-11-07 DIAGNOSIS — Z9221 Personal history of antineoplastic chemotherapy: Secondary | ICD-10-CM | POA: Diagnosis not present

## 2022-11-07 DIAGNOSIS — Z818 Family history of other mental and behavioral disorders: Secondary | ICD-10-CM | POA: Diagnosis not present

## 2022-11-07 DIAGNOSIS — N6489 Other specified disorders of breast: Secondary | ICD-10-CM | POA: Diagnosis not present

## 2022-11-07 DIAGNOSIS — I10 Essential (primary) hypertension: Secondary | ICD-10-CM | POA: Diagnosis not present

## 2022-11-07 DIAGNOSIS — Z8249 Family history of ischemic heart disease and other diseases of the circulatory system: Secondary | ICD-10-CM | POA: Diagnosis not present

## 2022-11-07 DIAGNOSIS — R69 Illness, unspecified: Secondary | ICD-10-CM | POA: Diagnosis not present

## 2022-11-07 DIAGNOSIS — Z803 Family history of malignant neoplasm of breast: Secondary | ICD-10-CM | POA: Diagnosis not present

## 2022-11-07 DIAGNOSIS — Z79899 Other long term (current) drug therapy: Secondary | ICD-10-CM | POA: Diagnosis not present

## 2022-11-07 DIAGNOSIS — Z823 Family history of stroke: Secondary | ICD-10-CM | POA: Diagnosis not present

## 2022-11-07 DIAGNOSIS — Z923 Personal history of irradiation: Secondary | ICD-10-CM | POA: Diagnosis not present

## 2022-11-07 DIAGNOSIS — Z841 Family history of disorders of kidney and ureter: Secondary | ICD-10-CM | POA: Diagnosis not present

## 2022-11-07 NOTE — Progress Notes (Signed)
Patient Care Team: Debbrah Alar, NP as PCP - General (Internal Medicine) Leonie Man, MD as PCP - Cardiology (Cardiology) Rolm Bookbinder, MD as Consulting Physician (General Surgery) Eppie Gibson, MD as Attending Physician (Radiation Oncology) Armbruster, Carlota Raspberry, MD as Consulting Physician (Gastroenterology) Benay Pike, MD as Consulting Physician (Hematology and Oncology) Mauro Kaufmann, RN as Oncology Nurse Navigator Rockwell Germany, RN as Oncology Nurse Navigator  DIAGNOSIS:  Encounter Diagnosis  Name Primary?   Malignant neoplasm of upper-outer quadrant of right breast in female, estrogen receptor negative ([place]) Yes    SUMMARY OF ONCOLOGIC HISTORY: Oncology History  Malignant neoplasm of upper-outer quadrant of right breast in female, estrogen receptor negative ([place])  10/24/2014 Mammogram   Right breast: ill-defined mass in the UOQ   10/24/2014 Breast US   Right breast: irregular hypoechoic taller than wide mass in the 11 o'clock location, 4 cm from the nipple, measuring 0.9 x 1.1 x 1.2 cm. Evaluation of the right axilla is negative for adenopathy.   11/12/2014 Initial Biopsy   Right breast needle core bx: Invasive ductal carcinoma, ER- (0%), PR- (0%), HER2/neu negative, Ki67 19%, grade 2-3.   11/20/2014 Procedure   Genetic testing: BreastNext panel Cephus Shelling) reveals no clinically significant variant at ATM, BARD1, BRCA1, BRCA2, BRIP1, CDH1, CHEK2, MRE11A, MUTYH, NBN, NF1, PALB2, PTEN, RAD50, RAD51C, RAD51D, and TP53.   11/21/2014 Breast MRI   Right breast mass measuring up to 1.3 cm without lymphadenopathy or findings to suggest multifocal or multicentric disease. Benign circumscribed oval enhancing mass located posterior medial to the malignancy consistent with fibroadenoma   11/24/2014 Clinical Stage   Stage IA: T1c N0   12/02/2014 - 02/03/2015 Neo-Adjuvant Chemotherapy   Cyclophosphamide and docetaxel x 4 cycles   02/09/2015 Breast MRI   Right breast  mass measures 1.0 x 0.7 x 0.5 cm, decreased from 1.4 x 1.4 x 1.0 cm. No additional areas of suspicion in right or left breast.   03/02/2015 Definitive Surgery   Right lumpectomy / [facility] Donne Hazel): IDC, grade 1, with high grade DCIS spanning 1 cmHER2/neu repeated and remains negative (ratio 1.62). 4 LN removed and negative for malignancy (0/4 LN).   03/02/2015 Pathologic Stage   Stage IA: ypT1b ypN0   04/20/2015 - 05/29/2015 Radiation Therapy   Adjuvant RT Isidore Moos): Right Breast  50 Gy over 25 fractions. Right Breast boost 10 Gy over 5 fractions. Total dose: 60 Gy.   07/28/2015 Survivorship   Survivorship visit completed and copy of care plan provided to patient.     CHIEF COMPLIANT: Follow-up second opinion  INTERVAL HISTORY: Katie Jacobson is a 64 y.r old with  triple negative breast cancer. Most recent mammogram was February, biopsy of right breast, benign changes. She presents to the clinic for a follow-up for a second opinion.  She has made up her mind regarding doing a right-sided mastectomy and a left-sided lumpectomy.  She has met with Dr. Donne Hazel and plan is being finalized for surgical date.   ALLERGIES:  is allergic to sulfa antibiotics.  MEDICATIONS:  Current Outpatient Medications  Medication Sig Dispense Refill   albuterol (VENTOLIN HFA) 108 (90 Base) MCG/ACT inhaler Inhale 1-2 puffs into the lungs every 6 (six) hours as needed for wheezing or shortness of breath. 1 each 0   aspirin 81 MG tablet Take 1 tablet (81 mg total) by mouth daily. 30 tablet 0   chlorthalidone (HYGROTON) 25 MG tablet TAKE 1 TABLET(25 MG) BY MOUTH EVERY OTHER DAY 45 tablet 1  enalapril (VASOTEC) 20 MG tablet TAKE 1 TABLET(20 MG) BY MOUTH AT BEDTIME 90 tablet 1   fluticasone (FLONASE) 50 MCG/ACT nasal spray Place 2 sprays into both nostrils as needed. 16 g 0   metoprolol succinate (TOPROL-XL) 100 MG 24 hr tablet Take 1 tablet (100 mg total) by mouth daily. Take with or immediately following a meal.  90 tablet 3   No current facility-administered medications for this visit.    PHYSICAL EXAMINATION: ECOG PERFORMANCE STATUS: 1 - Symptomatic but completely ambulatory  Vitals:   11/07/22 1254  BP: 132/62  Pulse: 63  Resp: 14  Temp: (!) 97.2 F (36.2 C)  SpO2: 100%   Filed Weights   11/07/22 1254  Weight: 222 lb 9.6 oz (101 kg)      LABORATORY DATA:  I have reviewed the data as listed    Latest Ref Rng & Units 11/01/2022    2:04 PM 01/28/2022    3:09 PM 08/16/2021    2:18 PM  CMP  Glucose 70 - 99 mg/dL 80  88  81   BUN 6 - 23 mg/dL '13  14  13   '$ Creatinine 0.40 - 1.20 mg/dL 0.84  0.90  0.84   Sodium 135 - 145 mEq/L 143  142  142   Potassium 3.5 - 5.1 mEq/L 3.9  4.1  3.9   Chloride 96 - 112 mEq/L 107  106  107   CO2 19 - 32 mEq/L '28  28  26   '$ Calcium 8.4 - 10.5 mg/dL 8.9  9.7  8.9   Total Protein 6.0 - 8.3 g/dL 6.5  6.6  7.0   Total Bilirubin 0.2 - 1.2 mg/dL 0.5  0.4  0.4   Alkaline Phos 39 - 117 U/L 64   74   AST 0 - 37 U/L '12  13  13   '$ ALT 0 - 35 U/L '12  14  13     '$ Lab Results  Component Value Date   WBC 6.8 08/16/2021   HGB 13.4 08/16/2021   HCT 40.2 08/16/2021   MCV 94.6 08/16/2021   PLT 206 08/16/2021   NEUTROABS 3.4 08/16/2021    ASSESSMENT & PLAN:  Malignant neoplasm of upper-outer quadrant of right breast in female, estrogen receptor negative ([place]) 11/12/2014: Right breast biopsy T1 cN0 stage Ia triple negative IDC Ki-67 19% 11/12/2014-02/03/2015: TC x 4 neoadjuvant chemotherapy, genetics negative 03/02/2015: Right lumpectomy: Grade 1 IDC negative margins triple negative August 2016: Completed radiation  November 2023: Asymmetry left breast and a possible mass (0.8 cm) and asymmetry in the right breast (1.7 cm and 1 cm) Right breast biopsy: 12:00: Grade 2 IDC ER 2%, PR 0%, Ki-67 50%, HER2 negative 2+ by IHC, 1230: Grade 2 IDC  left breast biopsy 1:00: Grade 2 IDC, ER 0%, PR 0%, Ki-67 95%, HER2 2+ , FISH: Negative  CT CAP and bone scan: 10/24/2022: No  evidence of metastatic disease.  Treatment plan: Right mastectomy and left lumpectomy Adjuvant chemotherapy: I agree with Dr. Chryl Heck about her treatment options being TC versus CMF I also recommend TC.  Chemo counseling: I discussed with her extensively about the risks and benefits of chemotherapy and she understands it very well.  I discussed with Dr. Donne Hazel who will proceed with the surgical plan. I will see her after surgery to discuss the final pathology report and to initiate adjuvant chemotherapy subsequently.       No orders of the defined types were placed in this encounter.  The  patient has a good understanding of the overall plan. she agrees with it. she will call with any problems that may develop before the next visit here. Total time spent: 30 mins including face to face time and time spent for planning, charting and co-ordination of care   Harriette Ohara, MD 11/07/22    I Gardiner Coins am acting as a Education administrator for [employer]  I have reviewed the above documentation for accuracy and completeness, and I agree with the above.

## 2022-11-07 NOTE — Assessment & Plan Note (Addendum)
11/12/2014: Right breast biopsy T1 cN0 stage Ia triple negative IDC Ki-67 19% 11/12/2014-02/03/2015: TC x 4 neoadjuvant chemotherapy, genetics negative 03/02/2015: Right lumpectomy: Grade 1 IDC negative margins triple negative August 2016: Completed radiation  November 2023: Asymmetry left breast and a possible mass (0.8 cm) and asymmetry in the right breast (1.7 cm and 1 cm) Right breast biopsy: 12:00: Grade 2 IDC ER 2%, PR 0%, Ki-67 50%, HER2 negative 2+ by IHC, 1230: Grade 2 IDC  left breast biopsy 1:00: Grade 2 IDC, ER 0%, PR 0%, Ki-67 95%, HER2 2+ , FISH: Negative  CT CAP and bone scan: 10/24/2022: No evidence of metastatic disease.  Treatment plan: Right mastectomy and left lumpectomy Adjuvant chemotherapy: I agree with Dr. Chryl Heck about her treatment options being TC versus CMF I also recommend TC.  Chemo counseling: I discussed with her extensively about the risks and benefits of chemotherapy and she understands it very well.  I discussed with Dr. Donne Hazel who will proceed with the surgical plan. I will see her after surgery to discuss the final pathology report and to initiate adjuvant chemotherapy subsequently.

## 2022-11-08 ENCOUNTER — Other Ambulatory Visit: Payer: Self-pay | Admitting: General Surgery

## 2022-11-17 ENCOUNTER — Encounter: Payer: Self-pay | Admitting: *Deleted

## 2022-11-21 ENCOUNTER — Encounter: Payer: Self-pay | Admitting: *Deleted

## 2022-11-21 ENCOUNTER — Other Ambulatory Visit: Payer: Self-pay | Admitting: General Surgery

## 2022-11-21 ENCOUNTER — Other Ambulatory Visit: Payer: Self-pay | Admitting: *Deleted

## 2022-11-21 DIAGNOSIS — Z171 Estrogen receptor negative status [ER-]: Secondary | ICD-10-CM

## 2022-11-22 ENCOUNTER — Telehealth: Payer: Self-pay | Admitting: Hematology and Oncology

## 2022-11-22 NOTE — Telephone Encounter (Signed)
Per 2/13 IB, Reached out to patient to schedule post op appointment, Patient declined at this time she stated she is not having surgery till later in march transferred patient to nurse for further questions.

## 2022-11-26 NOTE — Therapy (Signed)
OUTPATIENT PHYSICAL THERAPY BREAST CANCER BASELINE EVALUATION   Patient Name: Katie Jacobson MRN: [id number] DOB:Oct 29, 1955, 67 y.o., female Today's Date: 11/28/2022  END OF SESSION:  PT End of Session - 11/28/22 1459     Visit Number 1    Number of Visits 2    Date for PT Re-Evaluation 01/23/23    PT Start Time 1401    PT Stop Time 1457    PT Time Calculation (min) 56 min    Activity Tolerance Patient tolerated treatment well    Behavior During Therapy [facility] for tasks assessed/performed             Past Medical History:  Diagnosis Date   Allergy    Breast cancer of upper-outer quadrant of right female breast ([place]) 11/14/2014   Treated with chemotherapy and radiation   History of chemotherapy    finished chemo 02/03/2015   History of kidney stones    History of seizures    as a child - unknown cause - states was never on anticonvulsants   Hypertension    states under control with meds., has been on med. x "years"   Mitral valve prolapse 1990   Follow-up echocardiogram not show mitral prolapse.  Shows mild mitral vegetation.   Osteoporosis    Personal history of chemotherapy    Personal history of radiation therapy    Seasonal allergies    Past Surgical History:  Procedure Laterality Date   ABDOMINAL HYSTERECTOMY  1998   partial   APPENDECTOMY  1976   BREAST BIOPSY Right 09/30/2022   [place] RT BREAST BX W LOC DEV 1ST LESION IMG BX SPEC US GUIDE 09/30/2022 [facility]   BREAST BIOPSY Left 09/30/2022   [place] LT BREAST BX W LOC DEV 1ST LESION IMG BX SPEC US GUIDE 09/30/2022 [facility]   BREAST BIOPSY Right 09/30/2022   [place] RT BREAST BX W LOC DEV EA ADD LESION IMG BX SPEC US GUIDE 09/30/2022 [facility]   BREAST LUMPECTOMY Right 02/2015   COLONOSCOPY  09/30/2016   KNEE ARTHROSCOPY Left 03/26/2008   PLANTAR'S WART EXCISION Left    x 2   PORT-A-CATH REMOVAL Left 04/06/2015   Procedure: REMOVAL PORT-A-CATH;  Surgeon: Rolm Bookbinder, MD;  Location:  [place];  Service: General;  Laterality: Left;   PORTACATH PLACEMENT N/A 11/27/2014   Procedure: INSERTION PORT-A-CATH;  Surgeon: Rolm Bookbinder, MD;  Location: [place];  Service: General;  Laterality: N/A;   RADIOACTIVE SEED GUIDED PARTIAL MASTECTOMY WITH AXILLARY SENTINEL LYMPH NODE BIOPSY Right 03/02/2015   Procedure: RADIOACTIVE SEED GUIDED RIGHT PARTIAL MASTECTOMY WITH RIGHT AXILLARY SENTINEL LYMPH NODE BIOPSY;  Surgeon: Rolm Bookbinder, MD;  Location: [place];  Service: General;  Laterality: Right;   SALIVARY STONE REMOVAL Right 1972   TONSILLECTOMY  1970s   TRANSTHORACIC ECHOCARDIOGRAM  01/16/2020   EF 60 to 65%.  GRII DD.  No R WMA.  Normal RV size and function.  Rheumatic mitral valve with moderate thickening-hockey-stick appearing.  Moderate MR with mild MS.  Moderate LA dilation.  (Recommend follow-up in 2 to 3 years)   TRANSTHORACIC ECHOCARDIOGRAM  05/31/2022   Normal LV size and function-EF 60 to 65%.  No RWMA. ??  Normal diastolic parameters but elevated LAP?  Normal RV with normal RVP and RAP.  Manage mitral valve with mild MR and no MS.  Mild to moderate AI with aortic sclerosis but no stenosis.   [place]   Patient Active  Problem List   Diagnosis Date Noted   Bilateral malignant neoplasm of breast in female [facility]) 11/02/2022   Allergic reaction to chemical substance 01/30/2022   Preventative health care 01/28/2022   Cervical radiculopathy 04/20/2021   Primary osteoarthritis of left knee 03/09/2021   Cervical strain 02/09/2021   Sprain of medial collateral ligament of left knee 02/09/2021   Mitral regurgitation due to cusp prolapse 01/02/2020   Genetic testing 01/25/2016   Microscopic hematuria 09/06/2015   Vaginal dryness 12/09/2014   Family history of breast cancer    Malignant neoplasm of upper-outer quadrant of right breast in female, estrogen receptor negative ([place]) 11/14/2014   Intermittent  palpitations    Obesity (BMI 35.0-39.9 without comorbidity)    Hyperlipidemia LDL goal <100 07/07/2011   History of partial seizures 04/01/2011   Asthma 04/01/2011   Mitral valve prolapse 04/01/2011   Hot flashes 04/01/2011   Tobacco abuse 04/01/2011   Essential hypertension 04/01/2011     REFERRING PROVIDER: Benay Pike, MD  REFERRING DIAG: Right Breast Cancer  THERAPY DIAG:  Malignant neoplasm of upper-outer quadrant of right breast in female, estrogen receptor negative ([place])  Abnormal posture  Rationale for Evaluation and Treatment: Rehabilitation  ONSET DATE: 10/07/2022  SUBJECTIVE:                                                                                                                                                                                           SUBJECTIVE STATEMENT: Patient reports she is here today to be seen by her medical team for her newly diagnosed right breast cancer.  She has had some swelling in her right arm in the past and had some therapy for it. She has a compression sleeve that she wears as needed and she has done some MLD as well.  PERTINENT HISTORY:  Pt. Underwent a Right lumpectomy for Gr. 1 IDC on 03/02/2015 with 0/4 LN's. She had neoadjuvant chemo and radiation. In November of 2023 she noted breast asymmetry and patient was diagnosed on 10/07/2022 with right and left breast grade 2 IDC. It measures .8 cm  on the left and a It is Triple Negative with a Ki67 of 95% . Right breast mass is 1.7 cm and 1 cm and Ki 67 of 50%. She is planning to have a right mastectomy and left lumpectomy on 12/15/2022.  PATIENT GOALS:   reduce lymphedema risk and learn post op HEP.   PAIN:  Are you having pain? No  PRECAUTIONS: Active CA bilaterally  HAND DOMINANCE: writes with left but does everything else with the right hand  WEIGHT BEARING RESTRICTIONS: No  FALLS:  Has  patient fallen in last 6 months? Yes. Number of falls 1 a week ago outside;no  injury  LIVING ENVIRONMENT: Patient lives with: husband Lives in: House/apartment Has following equipment at home: Bedside commode  OCCUPATION: not working  LEISURE: church, buy and sell,walks some with the dogs.  PRIOR LEVEL OF FUNCTION: Independent   OBJECTIVE:  COGNITION: Overall cognitive status: Within functional limits for tasks assessed    POSTURE:  Forward head and rounded shoulders posture  UPPER EXTREMITY AROM/PROM:  A/PROM RIGHT   eval 11/28/2022  Shoulder extension 63  Shoulder flexion 140  Shoulder abduction 135  Shoulder internal rotation 65  Shoulder external rotation 84    (Blank rows = not tested)  A/PROM LEFT   eval  Shoulder extension 63  Shoulder flexion 165  Shoulder abduction 162  Shoulder internal rotation 73  Shoulder external rotation 87    (Blank rows = not tested)  Right UE ROM limited since 1st lumpectomy in 2016  CERVICAL AROM: All within functional limits:     UPPER EXTREMITY STRENGTH: WNL  LYMPHEDEMA ASSESSMENTS:   LANDMARK RIGHT   eval  10 cm proximal to olecranon process 38.2  Olecranon process 28.5  10 cm proximal to ulnar styloid process 26.4  Just proximal to ulnar styloid process 18.05  Across hand at thumb web space 20.1  At base of 2nd digit 6.0  (Blank rows = not tested)  LANDMARK LEFT   eval  10 cm proximal to olecranon process 38.4  Olecranon process 27.9  10 cm proximal to ulnar styloid process 25.1  Just proximal to ulnar styloid process 17.4  Across hand at thumb web space 19.8  At base of 2nd digit 5.6  (Blank rows = not tested)  QUICK DASH SURVEY: 34%  PATIENT EDUCATION:  Education details: Lymphedema risk reduction and post op shoulder/posture HEP, compression bra, ABC class Person educated: Patient Education method: Explanation, Demonstration, Handout Education comprehension: Patient verbalized understanding and returned demonstration  HOME EXERCISE PROGRAM: Patient was instructed today  in a home exercise program today for post op shoulder range of motion. These included active assist shoulder flexion in sitting, scapular retraction, wall walking with shoulder abduction, and hands behind head external rotation.  She was encouraged to do these twice a day, holding 3 seconds and repeating 5 times when permitted by her physician.   ASSESSMENT:  CLINICAL IMPRESSION: Pts. multidisciplinary medical team met prior to her assessments to determine a recommended treatment plan. She is planning to have a Right Mastectomy  with SLNB and and left lumpectomy with SLNB on 12/15/2022. She will have adjuvant chemotherapy and radiation on the left.. She will benefit from a post op PT reassessment to determine needs and from L-Dex screens every 3 months for 2 years to detect subclinical lymphedema.  Pt will benefit from skilled therapeutic intervention to improve on the following deficits: Decreased knowledge of precautions, impaired UE functional use, pain, decreased ROM, postural dysfunction.   PT treatment/interventions: ADL/self-care home management, pt/family education, therapeutic exercise  REHAB POTENTIAL: Good  CLINICAL DECISION MAKING: Stable/uncomplicated  EVALUATION COMPLEXITY: Low   GOALS: Goals reviewed with patient? YES  LONG TERM GOALS: (STG=LTG)    Name Target Date Goal status  1 Pt will be able to verbalize understanding of pertinent lymphedema risk reduction practices relevant to her dx specifically related to skin care.  Baseline:  No knowledge 11/28/2022 Achieved at eval  2 Pt will be able to return demo and/or verbalize understanding of the post op HEP related  to regaining shoulder ROM. Baseline:  No knowledge 11/28/2022 Achieved at eval  3 Pt will be able to verbalize understanding of the importance of attending the post op After Breast CA Class for further lymphedema risk reduction education and therapeutic exercise.  Baseline:  No knowledge 11/28/2022 Achieved at eval   4 Pt will demo she has regained full shoulder ROM and function post operatively compared to baselines.  Baseline: See objective measurements taken today. 01/23/2023 INITIAL    PLAN:  PT FREQUENCY/DURATION: EVAL and 1 follow up appointment.   PLAN FOR NEXT SESSION: will reassess 3-4 weeks post op to determine needs. NO SOZO required secondary to bilateral CA. With SLNB   Patient will follow up at outpatient cancer rehab 3-4 weeks following surgery.  If the patient requires physical therapy at that time, a specific plan will be dictated and sent to the referring physician for approval. The patient was educated today on appropriate basic range of motion exercises to begin post operatively and the importance of attending the After Breast Cancer class following surgery.  Patient was educated today on lymphedema risk reduction practices as it pertains to recommendations that will benefit the patient immediately following surgery.  She verbalized good understanding.    Physical Therapy Information for After Breast Cancer Surgery/Treatment:  Lymphedema is a swelling condition that you may be at risk for in your arm if you have lymph nodes removed from the armpit area.  After a sentinel node biopsy, the risk is approximately 5-9% and is higher after an axillary node dissection.  There is treatment available for this condition and it is not life-threatening.  Contact your physician or physical therapist with concerns. You may begin the 4 shoulder/posture exercises (see additional sheet) when permitted by your physician (typically a week after surgery).  If you have drains, you may need to wait until those are removed before beginning range of motion exercises.  A general recommendation is to not lift your arms above shoulder height until drains are removed.  These exercises should be done to your tolerance and gently.  This is not a "no pain/no gain" type of recovery so listen to your body and stretch into  the range of motion that you can tolerate, stopping if you have pain.  If you are having immediate reconstruction, ask your plastic surgeon about doing exercises as he or she may want you to wait. We encourage you to attend the free one time ABC (After Breast Cancer) class offered by [facility].  You will learn information related to lymphedema risk, prevention and treatment and additional exercises to regain mobility following surgery.  You can call [phone] for more information.  This is offered the 1st and 3rd Monday of each month.  You only attend the class one time. While undergoing any medical procedure or treatment, try to avoid blood pressure being taken or needle sticks from occurring on the arm on the side of cancer.   This recommendation begins after surgery and continues for the rest of your life.  This may help reduce your risk of getting lymphedema (swelling in your arm). An excellent resource for those seeking information on lymphedema is the National Lymphedema Network's web site. It can be accessed at [place]org If you notice swelling in your hand, arm or breast at any time following surgery (even if it is many years from now), please contact your doctor or physical therapist to discuss this.  Lymphedema can be treated at any time  but it is easier for you if it is treated early on.  If you feel like your shoulder motion is not returning to normal in a reasonable amount of time, please contact your surgeon or physical therapist.  [facility] [phone]. [address], [place] [address]  ABC CLASS After Breast Cancer Class  After Breast Cancer Class is a specially designed exercise class to assist you in a safe recover after having breast cancer surgery.  In this class you will learn how to get back to full function whether your drains were just removed or if you had surgery a month ago.  This one-time  class is held the 1st and 3rd Monday of every month from 11:00 a.m. until 12:00 noon virtually.  This class is FREE and space is limited. For more information or to register for the next available class, call [phone].  Class Goals  Understand specific stretches to improve the flexibility of you chest and shoulder. Learn ways to safely strengthen your upper body and improve your posture. Understand the warning signs of infection and why you may be at risk for an arm infection. Learn about Lymphedema and prevention.  ** You do not attend this class until after surgery.  Drains must be removed to participate  Patient was instructed today in a home exercise program today for post op shoulder range of motion. These included active assist shoulder flexion in sitting, scapular retraction, wall walking with shoulder abduction, and hands behind head external rotation.  She was encouraged to do these twice a day, holding 3 seconds and repeating 5 times when permitted by her physician.    Claris Pong, PT 11/28/2022, 3:00 PM

## 2022-11-28 ENCOUNTER — Ambulatory Visit: Payer: 59 | Attending: Hematology and Oncology

## 2022-11-28 ENCOUNTER — Other Ambulatory Visit: Payer: Self-pay

## 2022-11-28 DIAGNOSIS — Z171 Estrogen receptor negative status [ER-]: Secondary | ICD-10-CM | POA: Insufficient documentation

## 2022-11-28 DIAGNOSIS — C50411 Malignant neoplasm of upper-outer quadrant of right female breast: Secondary | ICD-10-CM | POA: Diagnosis not present

## 2022-11-28 DIAGNOSIS — R293 Abnormal posture: Secondary | ICD-10-CM | POA: Diagnosis not present

## 2022-12-09 DIAGNOSIS — C50911 Malignant neoplasm of unspecified site of right female breast: Secondary | ICD-10-CM | POA: Diagnosis not present

## 2022-12-09 DIAGNOSIS — C50912 Malignant neoplasm of unspecified site of left female breast: Secondary | ICD-10-CM | POA: Diagnosis not present

## 2022-12-09 NOTE — Pre-Procedure Instructions (Signed)
Surgical Instructions    Your procedure is scheduled on December 15, 2022.  Report to [facility] Main Entrance "A" at 11:30 A.M., then check in with the Admitting office.  Call this number if you have problems the morning of surgery:  [phone]  If you have any questions prior to your surgery date call [phone]: Open Monday-Friday 8am-4pm If you experience any cold or flu symptoms such as cough, fever, chills, shortness of breath, etc. between now and your scheduled surgery, please notify us at the above number.     Remember:  Do not eat after midnight the night before your surgery  You may drink clear liquids until 10:30 AM the morning of your surgery.   Clear liquids allowed are: Water, Non-Citrus Juices (without pulp), Carbonated Beverages, Clear Tea, Black Coffee Only (NO MILK, CREAM OR POWDERED CREAMER of any kind), and Gatorade.  Patient Instructions  The night before surgery:  No food after midnight. ONLY clear liquids after midnight  The day of surgery (if you do NOT have diabetes):  Drink ONE (1) Pre-Surgery Clear Ensure by 10:30 AM the morning of surgery. Drink in one sitting. Do not sip.  This drink was given to you during your hospital  pre-op appointment visit.  Nothing else to drink after completing the  Pre-Surgery Clear Ensure.         If you have questions, please contact your surgeon's office.      Take these medicines the morning of surgery with A SIP OF WATER:  metoprolol succinate (TOPROL-XL)   fluticasone (FLONASE) nasal spray - may take if needed  albuterol (VENTOLIN HFA) inhaler - may take if needed    Follow your surgeon's instructions on when to stop Aspirin.  If no instructions were given by your surgeon then you will need to call the office to get those instructions.     As of today, STOP taking any Aleve, Naproxen, Ibuprofen, Motrin, Advil, Goody's, BC's, all herbal medications, fish oil, and all vitamins.                     Do NOT  Smoke (Tobacco/Vaping) for 24 hours prior to your procedure.  If you use a CPAP at night, you may bring your mask/headgear for your overnight stay.   Contacts, glasses, piercing's, hearing aid's, dentures or partials may not be worn into surgery, please bring cases for these belongings.    For patients admitted to the hospital, discharge time will be determined by your treatment team.   Patients discharged the day of surgery will not be allowed to drive home, and someone needs to stay with them for 24 hours.  SURGICAL WAITING ROOM VISITATION Patients having surgery or a procedure may have no more than 2 support people in the waiting area - these visitors may rotate.   Children under the age of 37 must have an adult with them who is not the patient. If the patient needs to stay at the hospital during part of their recovery, the visitor guidelines for inpatient rooms apply. Pre-op nurse will coordinate an appropriate time for 1 support person to accompany patient in pre-op.  This support person may not rotate.   Please refer to the [facility] website for the visitor guidelines for Inpatients (after your surgery is over and you are in a regular room).    Special instructions:   [place]- Preparing For Surgery  Before surgery, you can play an important role. Because skin is not sterile,  your skin needs to be as free of germs as possible. You can reduce the number of germs on your skin by washing with CHG (chlorahexidine gluconate) Soap before surgery.  CHG is an antiseptic cleaner which kills germs and bonds with the skin to continue killing germs even after washing.    Oral Hygiene is also important to reduce your risk of infection.  Remember - BRUSH YOUR TEETH THE MORNING OF SURGERY WITH YOUR REGULAR TOOTHPASTE  Please do not use if you have an allergy to CHG or antibacterial soaps. If your skin becomes reddened/irritated stop using the CHG.  Do not shave (including legs and underarms)  for at least 48 hours prior to first CHG shower. It is OK to shave your face.  Please follow these instructions carefully.   Shower the NIGHT BEFORE SURGERY and the MORNING OF SURGERY  If you chose to wash your hair, wash your hair first as usual with your normal shampoo.  After you shampoo, rinse your hair and body thoroughly to remove the shampoo.  Use CHG Soap as you would any other liquid soap. You can apply CHG directly to the skin and wash gently with a scrungie or a clean washcloth.   Apply the CHG Soap to your body ONLY FROM THE NECK DOWN.  Do not use on open wounds or open sores. Avoid contact with your eyes, ears, mouth and genitals (private parts). Wash Face and genitals (private parts)  with your normal soap.   Wash thoroughly, paying special attention to the area where your surgery will be performed.  Thoroughly rinse your body with warm water from the neck down.  DO NOT shower/wash with your normal soap after using and rinsing off the CHG Soap.  Pat yourself dry with a CLEAN TOWEL.  Wear CLEAN PAJAMAS to bed the night before surgery  Place CLEAN SHEETS on your bed the night before your surgery  DO NOT SLEEP WITH PETS.   Day of Surgery: Take a shower with CHG soap. Do not wear jewelry or makeup Do not wear lotions, powders, perfumes/colognes, or deodorant. Do not shave 48 hours prior to surgery.  Men may shave face and neck. Do not bring valuables to the hospital.  [facility] is not responsible for any belongings or valuables. Do not wear nail polish, gel polish, artificial nails, or any other type of covering on natural nails (fingers and toes) If you have artificial nails or gel coating that need to be removed by a nail salon, please have this removed prior to surgery. Artificial nails or gel coating may interfere with anesthesia's ability to adequately monitor your vital signs.  Wear Clean/Comfortable clothing the morning of surgery Remember to brush your  teeth WITH YOUR REGULAR TOOTHPASTE.   Please read over the following fact sheets that you were given.    If you received a COVID test during your pre-op visit  it is requested that you wear a mask when out in public, stay away from anyone that may not be feeling well and notify your surgeon if you develop symptoms. If you have been in contact with anyone that has tested positive in the last 10 days please notify you surgeon.

## 2022-12-12 ENCOUNTER — Encounter (HOSPITAL_COMMUNITY): Payer: Self-pay

## 2022-12-12 ENCOUNTER — Encounter (HOSPITAL_COMMUNITY)
Admission: RE | Admit: 2022-12-12 | Discharge: 2022-12-12 | Disposition: A | Discharge status: Home / Self Care | Payer: 59 | Source: Ambulatory Visit | Attending: General Surgery | Admitting: General Surgery

## 2022-12-12 ENCOUNTER — Other Ambulatory Visit: Payer: Self-pay

## 2022-12-12 VITALS — BP 140/84 | HR 85 | Temp 98.2°F | Resp 17 | Ht 61.0 in | Wt 222.3 lb

## 2022-12-12 DIAGNOSIS — I2510 Atherosclerotic heart disease of native coronary artery without angina pectoris: Secondary | ICD-10-CM | POA: Insufficient documentation

## 2022-12-12 DIAGNOSIS — Z01812 Encounter for preprocedural laboratory examination: Secondary | ICD-10-CM | POA: Diagnosis not present

## 2022-12-12 HISTORY — DX: Cardiac murmur, unspecified: R01.1

## 2022-12-12 HISTORY — DX: Unspecified osteoarthritis, unspecified site: M19.90

## 2022-12-12 LAB — BASIC METABOLIC PANEL
Anion gap: 13 (ref 5–15)
BUN: 13 mg/dL (ref 8–23)
CO2: 23 mmol/L (ref 22–32)
Calcium: 9.1 mg/dL (ref 8.9–10.3)
Chloride: 103 mmol/L (ref 98–111)
Creatinine, Ser: 1 mg/dL (ref 0.44–1.00)
GFR, Estimated: >60 mL/min (ref >60)
Glucose, Bld: 89 mg/dL (ref 70–99)
Potassium: 4 mmol/L (ref 3.5–5.1)
Sodium: 139 mmol/L (ref 135–145)

## 2022-12-12 LAB — CBC
HCT: 42.3 % (ref 36.0–46.0)
Hemoglobin: 14.3 g/dL (ref 12.0–15.0)
MCH: 32.1 pg (ref 26.0–34.0)
MCHC: 33.8 g/dL (ref 30.0–36.0)
MCV: 94.8 fL (ref 80.0–100.0)
Platelets: 216 10*3/uL (ref 150–400)
RBC: 4.46 MIL/uL (ref 3.87–5.11)
RDW: 15.7 % — ABNORMAL HIGH (ref 11.5–15.5)
WBC: 6.9 10*3/uL (ref 4.0–10.5)
nRBC: 0 % (ref 0.0–0.2)

## 2022-12-12 NOTE — Progress Notes (Signed)
PCP - Debbrah Alar, NP Cardiologist - Dr. Glenetta Hew  PPM/ICD - Denies Device Orders - n/a Rep Notified -   Chest x-ray - n/a EKG - 08/23/2022 Stress Test - 07/01/1996 ECHO - 05/31/2022 Cardiac Cath - Denies  Sleep Study - Denies CPAP - n/a  No DM  Last dose of GLP1 agonist- n/a GLP1 instructions: n/a  Blood Thinner Instructions: n/a Aspirin Instructions: Pts last dose of ASA was March 2nd. Pt instructed to continue to hold her medication until after surgery.  ERAS Protcol - Clear liquids until 1030 morning of surgery PRE-SURGERY Ensure or G2- Ensure given to pt with instructions  COVID TEST- n/a   Anesthesia review: Yes. Breast seed placement 12/14/22   Patient denies shortness of breath, fever, cough and chest pain at PAT appointment   All instructions explained to the patient, with a verbal understanding of the material. Patient agrees to go over the instructions while at home for a better understanding. Patient also instructed to self quarantine after being tested for COVID-19. The opportunity to ask questions was provided.

## 2022-12-13 ENCOUNTER — Encounter (HOSPITAL_COMMUNITY): Payer: Self-pay | Admitting: General Surgery

## 2022-12-13 ENCOUNTER — Telehealth: Payer: Self-pay | Admitting: Cardiology

## 2022-12-13 NOTE — Telephone Encounter (Signed)
Thanks for the update

## 2022-12-13 NOTE — Telephone Encounter (Signed)
Patient calling to speak to the nurse, states she his having surgery and wanted to let the dr know. Please advise

## 2022-12-13 NOTE — Telephone Encounter (Signed)
Called patient, advised that she is having surgery on 03/07- cancer is back. Notes in epic. They state they did not need anything since patient was recently seen in November 2023- she just wanted [place] to be aware.   Will route to MD/RN  Thanks!

## 2022-12-14 ENCOUNTER — Ambulatory Visit
Admission: RE | Admit: 2022-12-14 | Discharge: 2022-12-14 | Disposition: A | Discharge status: Home / Self Care | Payer: 59 | Source: Ambulatory Visit | Attending: General Surgery | Admitting: General Surgery

## 2022-12-14 DIAGNOSIS — Z171 Estrogen receptor negative status [ER-]: Secondary | ICD-10-CM

## 2022-12-14 DIAGNOSIS — C50912 Malignant neoplasm of unspecified site of left female breast: Secondary | ICD-10-CM | POA: Diagnosis not present

## 2022-12-14 HISTORY — PX: BREAST BIOPSY: SHX20

## 2022-12-14 NOTE — Progress Notes (Signed)
Patient was called and informed that the surgery time for tomorrow was changed to 11:00 o'clock. Patient was instructed to be at the hospital at 09:00 o'clock and stop clear liquids at 08:00 o'clock. Patient was very upset about the time changed, but she verbalized that she understood the new instructions.

## 2022-12-15 ENCOUNTER — Encounter (HOSPITAL_COMMUNITY): Payer: Self-pay | Admitting: General Surgery

## 2022-12-15 ENCOUNTER — Ambulatory Visit (HOSPITAL_COMMUNITY): Payer: 59 | Admitting: Physician Assistant

## 2022-12-15 ENCOUNTER — Other Ambulatory Visit: Payer: Self-pay

## 2022-12-15 ENCOUNTER — Observation Stay (HOSPITAL_COMMUNITY): Payer: 59

## 2022-12-15 ENCOUNTER — Encounter (HOSPITAL_COMMUNITY)
Admission: RE | Disposition: A | Discharge status: Home / Self Care | Payer: Self-pay | Source: Ambulatory Visit | Attending: General Surgery

## 2022-12-15 ENCOUNTER — Ambulatory Visit (HOSPITAL_COMMUNITY): Payer: 59

## 2022-12-15 ENCOUNTER — Observation Stay (HOSPITAL_COMMUNITY)
Admission: RE | Admit: 2022-12-15 | Discharge: 2022-12-16 | Disposition: A | Discharge status: Home / Self Care | Payer: 59 | Source: Ambulatory Visit | Attending: General Surgery | Admitting: General Surgery

## 2022-12-15 ENCOUNTER — Ambulatory Visit (HOSPITAL_BASED_OUTPATIENT_CLINIC_OR_DEPARTMENT_OTHER): Payer: 59 | Admitting: Physician Assistant

## 2022-12-15 ENCOUNTER — Ambulatory Visit
Admission: RE | Admit: 2022-12-15 | Discharge: 2022-12-15 | Disposition: A | Discharge status: Home / Self Care | Payer: 59 | Source: Ambulatory Visit | Attending: General Surgery | Admitting: General Surgery

## 2022-12-15 DIAGNOSIS — C50412 Malignant neoplasm of upper-outer quadrant of left female breast: Secondary | ICD-10-CM | POA: Diagnosis not present

## 2022-12-15 DIAGNOSIS — G8918 Other acute postprocedural pain: Secondary | ICD-10-CM | POA: Diagnosis not present

## 2022-12-15 DIAGNOSIS — C50919 Malignant neoplasm of unspecified site of unspecified female breast: Secondary | ICD-10-CM | POA: Diagnosis present

## 2022-12-15 DIAGNOSIS — F17210 Nicotine dependence, cigarettes, uncomplicated: Secondary | ICD-10-CM

## 2022-12-15 DIAGNOSIS — I10 Essential (primary) hypertension: Secondary | ICD-10-CM | POA: Diagnosis not present

## 2022-12-15 DIAGNOSIS — Z96698 Presence of other orthopedic joint implants: Secondary | ICD-10-CM | POA: Insufficient documentation

## 2022-12-15 DIAGNOSIS — C50411 Malignant neoplasm of upper-outer quadrant of right female breast: Secondary | ICD-10-CM | POA: Diagnosis not present

## 2022-12-15 DIAGNOSIS — C50911 Malignant neoplasm of unspecified site of right female breast: Secondary | ICD-10-CM

## 2022-12-15 DIAGNOSIS — C50912 Malignant neoplasm of unspecified site of left female breast: Secondary | ICD-10-CM | POA: Diagnosis not present

## 2022-12-15 DIAGNOSIS — Z79899 Other long term (current) drug therapy: Secondary | ICD-10-CM | POA: Diagnosis not present

## 2022-12-15 DIAGNOSIS — F17200 Nicotine dependence, unspecified, uncomplicated: Secondary | ICD-10-CM | POA: Diagnosis not present

## 2022-12-15 DIAGNOSIS — R928 Other abnormal and inconclusive findings on diagnostic imaging of breast: Secondary | ICD-10-CM | POA: Diagnosis not present

## 2022-12-15 DIAGNOSIS — J45909 Unspecified asthma, uncomplicated: Secondary | ICD-10-CM | POA: Diagnosis not present

## 2022-12-15 DIAGNOSIS — Z171 Estrogen receptor negative status [ER-]: Secondary | ICD-10-CM

## 2022-12-15 HISTORY — PX: PORTACATH PLACEMENT: SHX2246

## 2022-12-15 HISTORY — PX: MASTECTOMY W/ SENTINEL NODE BIOPSY: SHX2001

## 2022-12-15 HISTORY — PX: BREAST LUMPECTOMY WITH RADIOACTIVE SEED AND SENTINEL LYMPH NODE BIOPSY: SHX6550

## 2022-12-15 SURGERY — MASTECTOMY WITH SENTINEL LYMPH NODE BIOPSY
Anesthesia: Regional | Site: Chest | Laterality: Right

## 2022-12-15 MED ORDER — KETOROLAC TROMETHAMINE 30 MG/ML IJ SOLN: 30.0000 mg | Freq: Once | INTRAMUSCULAR | Status: DC | PRN

## 2022-12-15 MED ORDER — TRANEXAMIC ACID-NACL 1000-0.7 MG/100ML-% IV SOLN
INTRAVENOUS | Status: AC
  Filled 2022-12-15: qty 200

## 2022-12-15 MED ORDER — FENTANYL CITRATE (PF) 250 MCG/5ML IJ SOLN
INTRAMUSCULAR | Status: AC
  Filled 2022-12-15: qty 5

## 2022-12-15 MED ORDER — AMISULPRIDE (ANTIEMETIC) 5 MG/2ML IV SOLN: 10.0000 mg | Freq: Once | INTRAVENOUS | Status: DC | PRN

## 2022-12-15 MED ORDER — FLUTICASONE PROPIONATE 50 MCG/ACT NA SUSP: 2.0000 | Freq: Every day | NASAL | Status: DC | PRN

## 2022-12-15 MED ORDER — ENSURE PRE-SURGERY PO LIQD: 296.0000 mL | Freq: Once | ORAL | Status: DC

## 2022-12-15 MED ORDER — SIMETHICONE 80 MG PO CHEW: 40.0000 mg | CHEWABLE_TABLET | Freq: Four times a day (QID) | ORAL | Status: DC | PRN

## 2022-12-15 MED ORDER — MEPERIDINE HCL 25 MG/ML IJ SOLN: 6.2500 mg | INTRAMUSCULAR | Status: DC | PRN

## 2022-12-15 MED ORDER — ENALAPRIL MALEATE 10 MG PO TABS
20.0000 mg | ORAL_TABLET | Freq: Every day | ORAL | Status: DC
  Administered 2022-12-15 – 2022-12-16 (×2): 20 mg via ORAL
  Filled 2022-12-15 (×2): qty 2

## 2022-12-15 MED ORDER — PHENYLEPHRINE HCL-NACL 20-0.9 MG/250ML-% IV SOLN
INTRAVENOUS | Status: DC | PRN
  Administered 2022-12-15: 25 ug/min via INTRAVENOUS

## 2022-12-15 MED ORDER — ALBUTEROL SULFATE HFA 108 (90 BASE) MCG/ACT IN AERS: 1.0000 | INHALATION_SPRAY | Freq: Four times a day (QID) | RESPIRATORY_TRACT | Status: DC | PRN

## 2022-12-15 MED ORDER — HEPARIN 6000 UNIT IRRIGATION SOLUTION
Status: DC | PRN
  Administered 2022-12-15: 1

## 2022-12-15 MED ORDER — ONDANSETRON HCL 4 MG/2ML IJ SOLN
INTRAMUSCULAR | Status: AC
  Filled 2022-12-15: qty 4

## 2022-12-15 MED ORDER — ONDANSETRON HCL 4 MG/2ML IJ SOLN: 4.0000 mg | Freq: Four times a day (QID) | INTRAMUSCULAR | Status: DC | PRN

## 2022-12-15 MED ORDER — CHLORHEXIDINE GLUCONATE 0.12 % MT SOLN
15.0000 mL | Freq: Once | OROMUCOSAL | Status: AC
  Administered 2022-12-15: 15 mL via OROMUCOSAL
  Filled 2022-12-15: qty 15

## 2022-12-15 MED ORDER — FENTANYL CITRATE (PF) 100 MCG/2ML IJ SOLN: 50.0000 ug | Freq: Once | INTRAMUSCULAR | Status: AC

## 2022-12-15 MED ORDER — BUPIVACAINE-EPINEPHRINE (PF) 0.25% -1:200000 IJ SOLN
INTRAMUSCULAR | Status: AC
  Filled 2022-12-15: qty 30

## 2022-12-15 MED ORDER — BUPIVACAINE-EPINEPHRINE 0.25% -1:200000 IJ SOLN
INTRAMUSCULAR | Status: DC | PRN
  Administered 2022-12-15: 5 mL

## 2022-12-15 MED ORDER — ONDANSETRON HCL 4 MG/2ML IJ SOLN
INTRAMUSCULAR | Status: DC | PRN
  Administered 2022-12-15: 4 mg via INTRAVENOUS

## 2022-12-15 MED ORDER — MAGTRACE LYMPHATIC TRACER
INTRAMUSCULAR | Status: DC | PRN
  Administered 2022-12-15: 1 mL via INTRAMUSCULAR

## 2022-12-15 MED ORDER — METOPROLOL SUCCINATE ER 100 MG PO TB24: 100.0000 mg | ORAL_TABLET | Freq: Every day | ORAL | Status: DC

## 2022-12-15 MED ORDER — HYDROMORPHONE HCL 1 MG/ML IJ SOLN
INTRAMUSCULAR | Status: AC
  Filled 2022-12-15: qty 1

## 2022-12-15 MED ORDER — SUGAMMADEX SODIUM 200 MG/2ML IV SOLN
INTRAVENOUS | Status: DC | PRN
  Administered 2022-12-15: 400 mg via INTRAVENOUS

## 2022-12-15 MED ORDER — ONDANSETRON 4 MG PO TBDP: 4.0000 mg | ORAL_TABLET | Freq: Four times a day (QID) | ORAL | Status: DC | PRN

## 2022-12-15 MED ORDER — OXYCODONE HCL 5 MG PO TABS
5.0000 mg | ORAL_TABLET | ORAL | Status: DC | PRN
  Administered 2022-12-15: 10 mg via ORAL
  Administered 2022-12-16: 5 mg via ORAL
  Administered 2022-12-16: 10 mg via ORAL
  Filled 2022-12-15: qty 2
  Filled 2022-12-15: qty 1
  Filled 2022-12-15: qty 2

## 2022-12-15 MED ORDER — ORAL CARE MOUTH RINSE: 15.0000 mL | Freq: Once | OROMUCOSAL | Status: AC

## 2022-12-15 MED ORDER — CLONIDINE HCL (ANALGESIA) 100 MCG/ML EP SOLN
EPIDURAL | Status: DC | PRN
  Administered 2022-12-15 (×2): 50 ug

## 2022-12-15 MED ORDER — PROPOFOL 500 MG/50ML IV EMUL
INTRAVENOUS | Status: DC | PRN
  Administered 2022-12-15 (×3): 150 ug/kg/min via INTRAVENOUS

## 2022-12-15 MED ORDER — MIDAZOLAM HCL 2 MG/2ML IJ SOLN: 2.0000 mg | Freq: Once | INTRAMUSCULAR | Status: AC

## 2022-12-15 MED ORDER — HEPARIN SOD (PORK) LOCK FLUSH 100 UNIT/ML IV SOLN
INTRAVENOUS | Status: AC
  Filled 2022-12-15: qty 5

## 2022-12-15 MED ORDER — PHENYLEPHRINE 80 MCG/ML (10ML) SYRINGE FOR IV PUSH (FOR BLOOD PRESSURE SUPPORT)
PREFILLED_SYRINGE | INTRAVENOUS | Status: DC | PRN
  Administered 2022-12-15: 80 ug via INTRAVENOUS

## 2022-12-15 MED ORDER — PROPOFOL 10 MG/ML IV BOLUS
INTRAVENOUS | Status: AC
  Filled 2022-12-15: qty 20

## 2022-12-15 MED ORDER — ALBUTEROL SULFATE (2.5 MG/3ML) 0.083% IN NEBU: 2.5000 mg | INHALATION_SOLUTION | Freq: Four times a day (QID) | RESPIRATORY_TRACT | Status: DC | PRN

## 2022-12-15 MED ORDER — DEXAMETHASONE SODIUM PHOSPHATE 10 MG/ML IJ SOLN
INTRAMUSCULAR | Status: AC
  Filled 2022-12-15: qty 2

## 2022-12-15 MED ORDER — ACETAMINOPHEN 500 MG PO TABS
1000.0000 mg | ORAL_TABLET | ORAL | Status: AC
  Administered 2022-12-15: 1000 mg via ORAL
  Filled 2022-12-15: qty 2

## 2022-12-15 MED ORDER — TRANEXAMIC ACID 1000 MG/10ML IV SOLN
2000.0000 mg | Freq: Once | INTRAVENOUS | Status: DC
  Filled 2022-12-15: qty 20

## 2022-12-15 MED ORDER — PROPOFOL 10 MG/ML IV BOLUS
INTRAVENOUS | Status: DC | PRN
  Administered 2022-12-15: 170 mg via INTRAVENOUS

## 2022-12-15 MED ORDER — HEPARIN SOD (PORK) LOCK FLUSH 100 UNIT/ML IV SOLN
INTRAVENOUS | Status: DC | PRN
  Administered 2022-12-15: 500 [IU] via INTRAVENOUS

## 2022-12-15 MED ORDER — LIDOCAINE 2% (20 MG/ML) 5 ML SYRINGE
INTRAMUSCULAR | Status: AC
  Filled 2022-12-15: qty 5

## 2022-12-15 MED ORDER — FENTANYL CITRATE (PF) 100 MCG/2ML IJ SOLN
INTRAMUSCULAR | Status: DC | PRN
  Administered 2022-12-15: 100 ug via INTRAVENOUS
  Administered 2022-12-15 (×2): 50 ug via INTRAVENOUS

## 2022-12-15 MED ORDER — METHYLENE BLUE 1 % INJ SOLN
INTRAVENOUS | Status: AC
  Filled 2022-12-15: qty 10

## 2022-12-15 MED ORDER — BISACODYL 10 MG RE SUPP: 10.0000 mg | Freq: Every day | RECTAL | Status: DC | PRN

## 2022-12-15 MED ORDER — FENTANYL CITRATE (PF) 100 MCG/2ML IJ SOLN
INTRAMUSCULAR | Status: AC
  Administered 2022-12-15: 50 ug via INTRAVENOUS
  Filled 2022-12-15: qty 2

## 2022-12-15 MED ORDER — BUPIVACAINE-EPINEPHRINE (PF) 0.25% -1:200000 IJ SOLN
INTRAMUSCULAR | Status: DC | PRN
  Administered 2022-12-15 (×2): 30 mL

## 2022-12-15 MED ORDER — LACTATED RINGERS IV SOLN: INTRAVENOUS | Status: DC

## 2022-12-15 MED ORDER — ROCURONIUM BROMIDE 10 MG/ML (PF) SYRINGE
PREFILLED_SYRINGE | INTRAVENOUS | Status: DC | PRN
  Administered 2022-12-15 (×2): 20 mg via INTRAVENOUS
  Administered 2022-12-15: 60 mg via INTRAVENOUS

## 2022-12-15 MED ORDER — CHLORHEXIDINE GLUCONATE CLOTH 2 % EX PADS: 6.0000 | MEDICATED_PAD | Freq: Once | CUTANEOUS | Status: DC

## 2022-12-15 MED ORDER — METHOCARBAMOL 500 MG PO TABS
500.0000 mg | ORAL_TABLET | Freq: Three times a day (TID) | ORAL | Status: DC | PRN
  Administered 2022-12-15 – 2022-12-16 (×2): 500 mg via ORAL
  Filled 2022-12-15 (×2): qty 1

## 2022-12-15 MED ORDER — ONDANSETRON HCL 4 MG/2ML IJ SOLN: 4.0000 mg | Freq: Once | INTRAMUSCULAR | Status: DC | PRN

## 2022-12-15 MED ORDER — TRAMADOL HCL 50 MG PO TABS
50.0000 mg | ORAL_TABLET | Freq: Four times a day (QID) | ORAL | Status: DC | PRN
  Administered 2022-12-15: 50 mg via ORAL
  Filled 2022-12-15: qty 1

## 2022-12-15 MED ORDER — SODIUM CHLORIDE 0.9 % IV SOLN: INTRAVENOUS | Status: DC

## 2022-12-15 MED ORDER — MIDAZOLAM HCL 2 MG/2ML IJ SOLN
INTRAMUSCULAR | Status: AC
  Administered 2022-12-15: 2 mg via INTRAVENOUS
  Filled 2022-12-15: qty 2

## 2022-12-15 MED ORDER — HEPARIN 6000 UNIT IRRIGATION SOLUTION
Status: AC
  Filled 2022-12-15: qty 500

## 2022-12-15 MED ORDER — OXYCODONE HCL 5 MG PO TABS: 5.0000 mg | ORAL_TABLET | Freq: Once | ORAL | Status: DC | PRN

## 2022-12-15 MED ORDER — MORPHINE SULFATE (PF) 2 MG/ML IV SOLN: 1.0000 mg | INTRAVENOUS | Status: DC | PRN

## 2022-12-15 MED ORDER — ACETAMINOPHEN 500 MG PO TABS
1000.0000 mg | ORAL_TABLET | Freq: Four times a day (QID) | ORAL | Status: DC
  Administered 2022-12-15 – 2022-12-16 (×3): 1000 mg via ORAL
  Filled 2022-12-15 (×3): qty 2

## 2022-12-15 MED ORDER — MIDAZOLAM HCL 2 MG/2ML IJ SOLN
INTRAMUSCULAR | Status: AC
  Filled 2022-12-15: qty 2

## 2022-12-15 MED ORDER — DEXAMETHASONE SODIUM PHOSPHATE 10 MG/ML IJ SOLN
INTRAMUSCULAR | Status: DC | PRN
  Administered 2022-12-15: 4 mg via INTRAVENOUS

## 2022-12-15 MED ORDER — HYDROMORPHONE HCL 1 MG/ML IJ SOLN
0.2500 mg | INTRAMUSCULAR | Status: DC | PRN
  Administered 2022-12-15: 0.5 mg via INTRAVENOUS

## 2022-12-15 MED ORDER — LIDOCAINE 2% (20 MG/ML) 5 ML SYRINGE
INTRAMUSCULAR | Status: DC | PRN
  Administered 2022-12-15: 20 mg via INTRAVENOUS

## 2022-12-15 MED ORDER — OXYCODONE HCL 5 MG/5ML PO SOLN: 5.0000 mg | Freq: Once | ORAL | Status: DC | PRN

## 2022-12-15 MED ORDER — CEFAZOLIN SODIUM-DEXTROSE 2-4 GM/100ML-% IV SOLN
2.0000 g | INTRAVENOUS | Status: AC
  Administered 2022-12-15: 2 g via INTRAVENOUS
  Filled 2022-12-15: qty 100

## 2022-12-15 SURGICAL SUPPLY — 100 items
ADH SKN CLS APL DERMABOND .7 (GAUZE/BANDAGES/DRESSINGS) ×6
ADH SKN CLS LQ APL DERMABOND (GAUZE/BANDAGES/DRESSINGS) ×3
APL PRP STRL LF DISP 70% ISPRP (MISCELLANEOUS) ×3
APPLIER CLIP 9.375 MED OPEN (MISCELLANEOUS)
APR CLP MED 9.3 20 MLT OPN (MISCELLANEOUS)
BAG COUNTER SPONGE SURGICOUNT (BAG) ×3 IMPLANT
BAG DECANTER FOR FLEXI CONT (MISCELLANEOUS) ×3 IMPLANT
BAG SPNG CNTER NS LX DISP (BAG) ×3
BINDER BREAST LRG (GAUZE/BANDAGES/DRESSINGS) IMPLANT
BINDER BREAST XLRG (GAUZE/BANDAGES/DRESSINGS) IMPLANT
BIOPATCH RED 1 DISK 7.0 (GAUZE/BANDAGES/DRESSINGS) IMPLANT
BLADE SURG 10 STRL SS (BLADE) IMPLANT
CANISTER SUCT 3000ML PPV (MISCELLANEOUS) ×3 IMPLANT
CHLORAPREP W/TINT 26 (MISCELLANEOUS) ×3 IMPLANT
CLIP APPLIE 9.375 MED OPEN (MISCELLANEOUS) IMPLANT
CLIP TI MEDIUM 6 (CLIP) ×3 IMPLANT
CNTNR URN SCR LID CUP LEK RST (MISCELLANEOUS) ×3 IMPLANT
CONT SPEC 4OZ CLIKSEAL STRL BL (MISCELLANEOUS) IMPLANT
CONT SPEC 4OZ STRL OR WHT (MISCELLANEOUS) ×3
COVER PROBE W GEL 5X96 (DRAPES) ×6 IMPLANT
COVER SURGICAL LIGHT HANDLE (MISCELLANEOUS) ×3 IMPLANT
DERMABOND ADVANCED .7 DNX12 (GAUZE/BANDAGES/DRESSINGS) ×3 IMPLANT
DERMABOND ADVANCED .7 DNX6 (GAUZE/BANDAGES/DRESSINGS) IMPLANT
DEVICE DUBIN SPECIMEN MAMMOGRA (MISCELLANEOUS) ×3 IMPLANT
DRAIN CHANNEL 19F RND (DRAIN) ×3 IMPLANT
DRAPE C-ARM 42X120 X-RAY (DRAPES) ×3 IMPLANT
DRAPE CHEST BREAST 15X10 FENES (DRAPES) ×3 IMPLANT
DRAPE TOP ARMCOVERS (MISCELLANEOUS) ×3 IMPLANT
DRAPE U-SHAPE 76X120 STRL (DRAPES) ×3 IMPLANT
DRAPE UTILITY XL STRL (DRAPES) IMPLANT
DRSG TEGADERM 4X4.75 (GAUZE/BANDAGES/DRESSINGS) IMPLANT
ELECT BLADE 4.0 EZ CLEAN MEGAD (MISCELLANEOUS) ×6
ELECT CAUTERY BLADE 6.4 (BLADE) ×3 IMPLANT
ELECT COATED BLADE 2.86 ST (ELECTRODE) ×3 IMPLANT
ELECT REM PT RETURN 9FT ADLT (ELECTROSURGICAL) ×3
ELECTRODE BLDE 4.0 EZ CLN MEGD (MISCELLANEOUS) ×3 IMPLANT
ELECTRODE REM PT RTRN 9FT ADLT (ELECTROSURGICAL) ×3 IMPLANT
EVACUATOR SILICONE 100CC (DRAIN) ×3 IMPLANT
GAUZE 4X4 16PLY ~~LOC~~+RFID DBL (SPONGE) ×3 IMPLANT
GAUZE PAD ABD 7.5X8 STRL (GAUZE/BANDAGES/DRESSINGS) IMPLANT
GAUZE PAD ABD 8X10 STRL (GAUZE/BANDAGES/DRESSINGS) IMPLANT
GAUZE SPONGE 4X4 12PLY STRL (GAUZE/BANDAGES/DRESSINGS) ×3 IMPLANT
GEL ULTRASOUND 20GR AQUASONIC (MISCELLANEOUS) ×3 IMPLANT
GLOVE BIO SURGEON STRL SZ7 (GLOVE) ×3 IMPLANT
GLOVE BIOGEL PI IND STRL 7.5 (GLOVE) ×3 IMPLANT
GOWN STRL REUS W/ TWL LRG LVL3 (GOWN DISPOSABLE) ×9 IMPLANT
GOWN STRL REUS W/TWL LRG LVL3 (GOWN DISPOSABLE) ×9
HEMOSTAT ARISTA ABSORB 1G (HEMOSTASIS) IMPLANT
ILLUMINATOR WAVEGUIDE N/F (MISCELLANEOUS) IMPLANT
INTRODUCER COOK 11FR (CATHETERS) IMPLANT
KIT BASIN OR (CUSTOM PROCEDURE TRAY) ×3 IMPLANT
KIT MARKER MARGIN INK (KITS) ×3 IMPLANT
KIT PORT POWER 8FR ISP CVUE (Port) ×3 IMPLANT
KIT TURNOVER KIT B (KITS) ×3 IMPLANT
MARKER SKIN DUAL TIP RULER LAB (MISCELLANEOUS) ×3 IMPLANT
NDL 18GX1X1/2 (RX/OR ONLY) (NEEDLE) IMPLANT
NDL FILTER BLUNT 18X1 1/2 (NEEDLE) IMPLANT
NDL HYPO 25GX1X1/2 BEV (NEEDLE) ×3 IMPLANT
NEEDLE 18GX1X1/2 (RX/OR ONLY) (NEEDLE) IMPLANT
NEEDLE FILTER BLUNT 18X1 1/2 (NEEDLE) IMPLANT
NEEDLE HYPO 25GX1X1/2 BEV (NEEDLE) ×3 IMPLANT
NS IRRIG 1000ML POUR BTL (IV SOLUTION) ×3 IMPLANT
PACK GENERAL/GYN (CUSTOM PROCEDURE TRAY) ×3 IMPLANT
PAD ARMBOARD 7.5X6 YLW CONV (MISCELLANEOUS) ×6 IMPLANT
PENCIL BUTTON HOLSTER BLD 10FT (ELECTRODE) ×3 IMPLANT
PENCIL SMOKE EVACUATOR (MISCELLANEOUS) ×3 IMPLANT
PIN SAFETY STERILE (MISCELLANEOUS) ×3 IMPLANT
POSITIONER HEAD DONUT 9IN (MISCELLANEOUS) ×3 IMPLANT
RETRACTOR ONETRAX LX 90X20 (MISCELLANEOUS) IMPLANT
SET INTRODUCER 12FR PACEMAKER (INTRODUCER) IMPLANT
SET SHEATH INTRODUCER 10FR (MISCELLANEOUS) IMPLANT
SHEATH COOK PEEL AWAY SET 9F (SHEATH) IMPLANT
SPECIMEN JAR X LARGE (MISCELLANEOUS) ×3 IMPLANT
SPIKE FLUID TRANSFER (MISCELLANEOUS) ×3 IMPLANT
STAPLER VISISTAT 35W (STAPLE) ×3 IMPLANT
STRIP CLOSURE SKIN 1/2X4 (GAUZE/BANDAGES/DRESSINGS) ×3 IMPLANT
SUT ETHILON 2 0 FS 18 (SUTURE) ×3 IMPLANT
SUT ETHILON 3 0 FSL (SUTURE) IMPLANT
SUT MNCRL AB 4-0 PS2 18 (SUTURE) ×6 IMPLANT
SUT MON AB 5-0 PS2 18 (SUTURE) IMPLANT
SUT PROLENE 2 0 SH DA (SUTURE) ×3 IMPLANT
SUT SILK 2 0 (SUTURE) ×3
SUT SILK 2 0 SH (SUTURE) IMPLANT
SUT SILK 2-0 18XBRD TIE 12 (SUTURE) IMPLANT
SUT VIC AB 2-0 SH 27 (SUTURE) ×15
SUT VIC AB 2-0 SH 27X BRD (SUTURE) IMPLANT
SUT VIC AB 2-0 SH 27XBRD (SUTURE) ×6 IMPLANT
SUT VIC AB 3-0 54X BRD REEL (SUTURE) ×3 IMPLANT
SUT VIC AB 3-0 BRD 54 (SUTURE) ×3
SUT VIC AB 3-0 SH 18 (SUTURE) ×3 IMPLANT
SUT VIC AB 3-0 SH 27 (SUTURE) ×9
SUT VIC AB 3-0 SH 27X BRD (SUTURE) ×6 IMPLANT
SUT VIC AB 3-0 SH 27XBRD (SUTURE) ×3 IMPLANT
SUT VIC AB 3-0 SH 8-18 (SUTURE) ×3 IMPLANT
SYR 5ML LUER SLIP (SYRINGE) ×3 IMPLANT
SYR CONTROL 10ML LL (SYRINGE) ×3 IMPLANT
TAPE STRIPS DRAPE STRL (GAUZE/BANDAGES/DRESSINGS) IMPLANT
TOWEL GREEN STERILE (TOWEL DISPOSABLE) ×3 IMPLANT
TOWEL GREEN STERILE FF (TOWEL DISPOSABLE) ×3 IMPLANT
TRAY LAPAROSCOPIC MC (CUSTOM PROCEDURE TRAY) ×3 IMPLANT

## 2022-12-15 NOTE — Anesthesia Procedure Notes (Signed)
Procedure Name: Intubation Date/Time: 12/15/2022 12:07 PM  Performed by: Leonor Liv, CRNAPre-anesthesia Checklist: Patient identified, Emergency Drugs available, Suction available and Patient being monitored Patient Re-evaluated:Patient Re-evaluated prior to induction Oxygen Delivery Method: Circle system utilized Preoxygenation: Pre-oxygenation with 100% oxygen Induction Type: IV induction Ventilation: Mask ventilation without difficulty Laryngoscope Size: Mac and 3 Grade View: Grade II Tube type: Oral Tube size: 7.0 mm Number of attempts: 1 Airway Equipment and Method: Stylet and Oral airway Placement Confirmation: ETT inserted through vocal cords under direct vision, positive ETCO2 and breath sounds checked- equal and bilateral Secured at: 21 cm Tube secured with: Tape Dental Injury: Teeth and Oropharynx as per pre-operative assessment

## 2022-12-15 NOTE — Progress Notes (Signed)
Patient arrived from PACU a little drowsy but alert and orient. Patient complaining, see MAR. Jp drain is closed and pin to the gown. Orient patient to the room. Family at bedside.

## 2022-12-15 NOTE — Anesthesia Postprocedure Evaluation (Signed)
Anesthesia Post Note  Patient: WYLODENE BURNINGHAM  Procedure(s) Performed: RIGHT MASTECTOMY WITH RIGHT AXILLARY SENTINEL LYMPH NODE BIOPSY (Right: Breast) LEFT BREAST LUMPECTOMY WITH RADIOACTIVE SEED AND AXILLARY SENTINEL LYMPH NODE BIOPSY (Left: Breast) INSERTION PORT-A-CATH (Right: Chest)     Patient location during evaluation: PACU Anesthesia Type: Regional Level of consciousness: sedated, patient cooperative and oriented Pain management: pain level controlled Vital Signs Assessment: post-procedure vital signs reviewed and stable Respiratory status: spontaneous breathing, nonlabored ventilation and respiratory function stable Cardiovascular status: blood pressure returned to baseline and stable Postop Assessment: no apparent nausea or vomiting Anesthetic complications: no   No notable events documented.  Last Vitals:  Vitals:   12/15/22 1600 12/15/22 1615  BP: 126/67 126/65  Pulse: 60 (!) 51  Resp: 20 20  Temp:  36.9 C  SpO2: 96% 99%    Last Pain:  Vitals:   12/15/22 1615  TempSrc:   PainSc: Asleep                 Kutler Vanvranken,E. Rayleen Wyrick

## 2022-12-15 NOTE — Op Note (Addendum)
Preoperative diagnosis:  Recurrent right breast cancer Left breast cancer Postoperative diagnosis: same as above Procedure: Right IJ port placement Left breast seed guided lumpectomy Injection of magtrace bilateral breasts for sentinel node identification Left deep axillary sentinel node biopsy Right mastectomy Surgeon: Dr Serita Grammes EBL: 75 cc Drains: 19 Fr blake drain to right side Complications none Specimens Left breast lumpectomy containing seed and clip marked with paint Additional left breast margins marked short superior, long lateral and double deep Right mastectomy short superior, long lateral Sponge and needle count correct Dispo recovery stable.  Indications: 73 yof I took care of in 2016 for right breast cancer. She had primary chemo followed by lumpectomy/sn biopsy for ypT1bN0 cancer. She had radiotherapy. She had alot of difficulty with chemotherapy. She has done well. She had recent mm that shows b density breast tissue. She has a left breast asymmetry that is 7 mm on mm on 8 mm on [place]. This is a grade II IDC that is triple negative with high KI. On right side she has uoq mass that on [place] has two areas measuring 1.7 cm and 1 cm. These are both grade II IDC that is 2% er pos, pr neg, her 2 negative and Ki is 50%. We discussed left lumpectomy, sn biopsy, right mastectomy, attempt repeat right sn biopsy and port placement after seeing oncology.  Procedure: After informed consent was obtained she first underwent bilateral pec blocks.  She was given antibiotics.  SCDs were placed.  She was then placed under general anesthesia without complication.  She was prepped and draped in the standard sterile surgical fashion.  Surgical timeout was then performed.  I injected 2 cc of mag trace in the subareolar position on both sides  I first did the port.  I identified the right internal jugular vein with the ultrasound.  I accessed this on the first pass with the needle.  The wire was  then placed.  The wire was confirmed to be in the IJ with the ultrasound and I confirmed good positioning with fluoroscopy.  I then made an incision and developed a pocket below the clavicle on the right side.  I tunneled the line between the 2 sites.  I then placed the dilator over the wire and under fluoroscopy watch this going in position.  I remove the wire assembly.  The line was placed through the peel-away sheath.  Peel-away sheath was removed.  I pulled the line back to be in the distal cava near the cavoatrial junction.  I then hooked this up to the port.  This was sutured into position in the pocket and the chest wall.  I aspirated blood and flushed this easily.  I packed this with heparin.  Final x-ray showed this to be all in good position.  I then closed this with 3-0 Vicryl for Monocryl.  Glue was placed.  We then broke down everything and reprepped and draped her.  Another timeout was done.  I then did a lumpectomy on the left side first.  I used the neoprobe to identify this in the left upper outer quadrant.  I made a curvilinear incision and dissected down to the seed.  The seed and the surrounding tissue were then removed with an attempt to get a clear margin.  I did a mammogram which confirmed removal of the seed and the clip.  I did a 3D image and it look like I was close to several margins which I excised.  These were  marked as above.  I then obtained hemostasis.  I placed a couple of clips in this cavity for later radiation.  I then closed the cavity with 2-0 Vicryl.  The skin was closed with 3-0 Vicryl for Monocryl.  I then made an incision in the left axilla right below the hairline.  I carried this to the axillary fascia.  I identified a brown node that had the mag trace present in it immediately.  There were a couple of other nodes there does well.  The counts were over 600.  I removed several of these nodes that were together.  Once I had done this there was no additional abnormal lymph  nodes and no activity.  I obtained hemostasis here.  I then closed this with 2-0 Vicryl, 3-0 Vicryl, and 4-0 Monocryl.  Glue and Steri-Strips were applied.  I then did the right mastectomy.  I made a large elliptical incision encompassing the nipple and the areola that went to her inframammary fold.  I then created flaps of the parasternal area, near the clavicle, inframammary fold as well as the anterior axillary line.  I obliterated the inframammary fold.  I then removed the breast with some difficulty due to her prior surgery and radiation from the muscle.  This was then passed off the table after being marked.  I attempted to locate a sentinel node but the tracer did not map.  There were no abnormal lymph nodes so I decided to stop at that point as we had discussed.  I then placed a TXA soaked sponge in there for 5 minutes.  I remove this and hemostasis was observed.  I then placed a 42 [place] Blake drain and secured this with a 2-0 nylon suture.  The incision was then closed to the chest wall with 3-0 Vicryl.  The skin was closed with 4-0 Monocryl.  Glue Steri-Strips were applied.  She tolerated all is well was extubated and transferred recovery stable.

## 2022-12-15 NOTE — Anesthesia Preprocedure Evaluation (Addendum)
Anesthesia Evaluation  Patient identified by MRN, date of birth, ID band Patient awake    Reviewed: Allergy & Precautions, H&P , NPO status , Patient's Chart, lab work & pertinent test results, reviewed documented beta blocker date and time   Airway Mallampati: II  TM Distance: >3 FB Neck ROM: Full    Dental no notable dental hx.    Pulmonary asthma , Current Smoker and Patient abstained from smoking. Quit smoking 2017, 22 pack year history    Pulmonary exam normal breath sounds clear to auscultation       Cardiovascular hypertension, Pt. on medications and Pt. on home beta blockers Normal cardiovascular exam+ Valvular Problems/Murmurs MVP  Rhythm:Regular Rate:Normal     Neuro/Psych negative neurological ROS  negative psych ROS   GI/Hepatic negative GI ROS, Neg liver ROS,,,  Endo/Other    Morbid obesityBMI 42  Renal/GU negative Renal ROS  negative genitourinary   Musculoskeletal  (+) Arthritis , Osteoarthritis,    Abdominal  (+) + obese  Peds negative pediatric ROS (+)  Hematology negative hematology ROS (+)   Anesthesia Other Findings   Reproductive/Obstetrics negative OB ROS                              Anesthesia Physical Anesthesia Plan  ASA: 3  Anesthesia Plan: General and Regional   Post-op Pain Management: Tylenol PO (pre-op)*, Toradol IV (intra-op)* and Regional block*   Induction: Intravenous  PONV Risk Score and Plan: 2 and Ondansetron, Dexamethasone, Midazolam, Treatment may vary due to age or medical condition, Propofol infusion and TIVA  Airway Management Planned: LMA  Additional Equipment: None  Intra-op Plan:   Post-operative Plan: Extubation in OR  Informed Consent: I have reviewed the patients History and Physical, chart, labs and discussed the procedure including the risks, benefits and alternatives for the proposed anesthesia with the patient or authorized  representative who has indicated his/her understanding and acceptance.     Dental advisory given  Plan Discussed with: CRNA  Anesthesia Plan Comments:          Anesthesia Quick Evaluation

## 2022-12-15 NOTE — Transfer of Care (Signed)
Immediate Anesthesia Transfer of Care Note  Patient: Katie Jacobson  Procedure(s) Performed: RIGHT MASTECTOMY WITH RIGHT AXILLARY SENTINEL LYMPH NODE BIOPSY (Right: Breast) LEFT BREAST LUMPECTOMY WITH RADIOACTIVE SEED AND AXILLARY SENTINEL LYMPH NODE BIOPSY (Left: Breast) INSERTION PORT-A-CATH (Right: Chest)  Patient Location: PACU  Anesthesia Type:General  Level of Consciousness: drowsy and patient cooperative  Airway & Oxygen Therapy: Patient Spontanous Breathing and Patient connected to face mask oxygen  Post-op Assessment: Report given to RN and Post -op Vital signs reviewed and stable  Post vital signs: Reviewed and stable  Last Vitals:  Vitals Value Taken Time  BP 97/58 12/15/22 1515  Temp    Pulse 62 12/15/22 1518  Resp 21 12/15/22 1518  SpO2 100 % 12/15/22 1518  Vitals shown include unvalidated device data.  Last Pain:  Vitals:   12/15/22 0943  TempSrc:   PainSc: 0-No pain         Complications: No notable events documented.

## 2022-12-15 NOTE — Interval H&P Note (Signed)
History and Physical Interval Note:  12/15/2022 11:23 AM  Katie Jacobson  has presented today for surgery, with the diagnosis of BREAST CANCER.  The various methods of treatment have been discussed with the patient and family. After consideration of risks, benefits and other options for treatment, the patient has consented to  Procedure(s) with comments: RIGHT MASTECTOMY WITH RIGHT AXILLARY SENTINEL LYMPH NODE BIOPSY (Right) - 180 MIN ROOM 9 LEFT BREAST LUMPECTOMY WITH RADIOACTIVE SEED AND AXILLARY SENTINEL LYMPH NODE BIOPSY (Left) INSERTION PORT-A-CATH (N/A) as a surgical intervention.  The patient's history has been reviewed, patient examined, no change in status, stable for surgery.  I have reviewed the patient's chart and labs.  Questions were answered to the patient's satisfaction.     Rolm Bookbinder

## 2022-12-15 NOTE — Anesthesia Procedure Notes (Signed)
Anesthesia Regional Block: Pectoralis block   Pre-Anesthetic Checklist: , timeout performed,  Correct Patient, Correct Site, Correct Laterality,  Correct Procedure, Correct Position, site marked,  Risks and benefits discussed,  Surgical consent,  Pre-op evaluation,  At surgeon's request and post-op pain management  Laterality: Right and Left  Prep: chloraprep       Needles:  Injection technique: Single-shot  Needle Type: Echogenic Needle     Needle Length: 9cm  Needle Gauge: 21     Additional Needles:   Procedures:,,,, ultrasound used (permanent image in chart),,    Narrative:  Start time: 12/15/2022 10:57 AM End time: 12/15/2022 11:07 AM Injection made incrementally with aspirations every 5 mL.  Performed by: Personally  Anesthesiologist: Suzette Battiest, MD

## 2022-12-15 NOTE — H&P (Signed)
6 yof I took care of in 2016 for right breast cancer. She had primary chemo followed by lumpectomy/sn biopsy for ypT1bN0 cancer. She had radiotherapy. She had alot of difficulty with chemotherapy. She has done well. She had recent mm that shows b density breast tissue. She has a left breast asymmetry that is 7 mm on mm on 8 mm on [place]. This is a grade II IDC that is triple negative with high KI. On right side she has uoq mass that on [place] has two areas measuring 1.7 cm and 1 cm. These are both grade II IDC that is 2% er pos, pr neg, her 2 negative and Ki is 50%. She is here with her husband to discuss options.  Review of Systems: A complete review of systems was obtained from the patient. I have reviewed this information and discussed as appropriate with the patient. See HPI as well for other ROS.  Review of Systems  All other systems reviewed and are negative.   Medical History: Past Medical History:  Diagnosis Date  History of cancer   Patient Active Problem List  Diagnosis  Malignant neoplasm of upper-outer quadrant of right breast in female, estrogen receptor negative   Past Surgical History:  Procedure Laterality Date  APPENDECTOMY  DEEP AXILLARY SENTINEL NODE BIOPSY / EXCISION Right  HYSTERECTOMY  JOINT REPLACEMENT  MASTECTOMY PARTIAL / LUMPECTOMY Right    Allergies  Allergen Reactions  Sulfa (Sulfonamide Antibiotics) Other (See Comments)  UNKNOWN   Current Outpatient Medications on File Prior to Visit  Medication Sig Dispense Refill  albuterol 90 mcg/actuation inhaler Inhale into the lungs  chlorthalidone 25 MG tablet TAKE 1 TABLET(25 MG) BY MOUTH EVERY OTHER DAY  enalapril (VASOTEC) 20 MG tablet TAKE 1 TABLET(20 MG) BY MOUTH AT BEDTIME  fluticasone propionate (FLONASE) 50 mcg/actuation nasal spray Place 2 sprays into one nostril    Family History  Problem Relation Age of Onset  Diabetes Sister   Social History   Tobacco Use  Smoking Status Every Day  Types:  Cigarettes  Smokeless Tobacco Not on file  Marital status: Married  Tobacco Use  Smoking status: Every Day  Types: Cigarettes  Substance and Sexual Activity  Alcohol use: Yes  Drug use: Not Currently   Objective:   Body mass index is 40.46 kg/m.  Physical Exam Vitals reviewed.  Constitutional:  Appearance: Normal appearance.  Chest:  Breasts: Right: No inverted nipple, mass or nipple discharge.  Left: No inverted nipple, mass or nipple discharge.  Comments: Right breast with radiation changes Lymphadenopathy:  Upper Body:  Right upper body: No supraclavicular or axillary adenopathy.  Left upper body: No supraclavicular or axillary adenopathy.  Neurological:  Mental Status: She is alert.    Assessment and Plan:   Malignant neoplasm of upper-outer quadrant of right breast in female, estrogen receptor negative   Malignant neoplasm of upper-outer quadrant of left breast in female, estrogen receptor negative   Right mastectomy, left breast seed guided lumpectomy, bilateral ax sn biopsy, port placement   We discussed the staging and pathophysiology of breast cancer. We discussed all of the different options for treatment for breast cancer including surgery, chemotherapy, radiation therapy, Herceptin, and antiestrogen therapy.  We discussed a sentinel lymph node biopsy as she does not appear to having lymph node involvement right now. We discussed the performance of that with injection of tracer. We discussed that there is a chance of having a positive node with a sentinel lymph node biopsy and we will await  the permanent pathology to make any other first further decisions in terms of her treatment. We discussed up to a 5% risk lifetime of chronic shoulder pain as well as lymphedema associated with a sentinel lymph node biopsy. I will try to do right side but if cannot locate will not dissect axilla.   We discussed the options for treatment of the breast cancer which included  lumpectomy versus a mastectomy. We discussed the performance of the lumpectomy with radioactive seed placement. We discussed a 5-10% chance of a positive margin requiring reexcision in the operating room. We also discussed that she will likely need radiation therapy if she undergoes lumpectomy. We discussed mastectomy and the postoperative care for that as well. Mastectomy can be followed by reconstruction. The decision for lumpectomy vs mastectomy has no impact on decision for chemotherapy. Most mastectomy patients will not need radiation therapy. We discussed that there is no difference in her survival whether she undergoes lumpectomy with radiation therapy or antiestrogen therapy versus a mastectomy. There is also no real difference between her recurrence in the breast. She needs a mastectomy on right side and she does not want reconstruction. We discussed flat closure as much as possible. She is at higher risk for complications on that side given radiation as well. She would like to do bct on left  Will plan for port placement after discussing with oncology.   We discussed the risks of operation including bleeding, infection, possible reoperation. She understands her further therapy will be based on what her stages at the time of her operation.

## 2022-12-16 DIAGNOSIS — F17210 Nicotine dependence, cigarettes, uncomplicated: Secondary | ICD-10-CM | POA: Diagnosis not present

## 2022-12-16 DIAGNOSIS — Z79899 Other long term (current) drug therapy: Secondary | ICD-10-CM | POA: Diagnosis not present

## 2022-12-16 DIAGNOSIS — C50911 Malignant neoplasm of unspecified site of right female breast: Secondary | ICD-10-CM | POA: Diagnosis not present

## 2022-12-16 DIAGNOSIS — C50912 Malignant neoplasm of unspecified site of left female breast: Secondary | ICD-10-CM | POA: Diagnosis not present

## 2022-12-16 DIAGNOSIS — Z96698 Presence of other orthopedic joint implants: Secondary | ICD-10-CM | POA: Diagnosis not present

## 2022-12-16 MED ORDER — METHOCARBAMOL 500 MG PO TABS: 500.0000 mg | ORAL_TABLET | Freq: Three times a day (TID) | ORAL | 0 refills | Status: DC | PRN

## 2022-12-16 MED ORDER — OXYCODONE HCL 5 MG PO TABS: 5.0000 mg | ORAL_TABLET | ORAL | 0 refills | Status: DC | PRN

## 2022-12-16 NOTE — Progress Notes (Signed)
1 Day Post-Op   Subjective/Chief Complaint: Doing fine postop, pain controlled oral meds   Objective: Vital signs in last 24 hours: Temp:  [97.3 F (36.3 C)-98.4 F (36.9 C)] 97.3 F (36.3 C) (03/08 0600) Pulse Rate:  [51-76] 56 (03/08 0600) Resp:  [18-22] 18 (03/08 0600) BP: (97-159)/(58-88) 115/60 (03/08 0600) SpO2:  [96 %-100 %] 100 % (03/08 0600) Weight:  [99.8 kg] 99.8 kg (03/07 0920) Last BM Date : 12/15/22  Intake/Output from previous day: 03/07 0701 - 03/08 0700 In: 2153.4 [P.O.:120; I.V.:2033.4] Out: 325 [Urine:200; Drains:25; Blood:100] Intake/Output this shift: Total I/O In: -  Out: 25 [Drains:25]  Right mastectomy without hematoma, drain as expected Port and lump/sn on left without hematoma  Lab Results:  No results for input(s): "WBC", "HGB", "HCT", "PLT" in the last 72 hours. BMET No results for input(s): "NA", "K", "CL", "CO2", "GLUCOSE", "BUN", "CREATININE", "CALCIUM" in the last 72 hours. PT/INR No results for input(s): "LABPROT", "INR" in the last 72 hours. ABG No results for input(s): "PHART", "HCO3" in the last 72 hours.  Invalid input(s): "PCO2", "PO2"  Studies/Results: DG CHEST PORT 1 VIEW  Result Date: 12/15/2022 CLINICAL DATA:  Port-A-Cath in place. EXAM: PORTABLE CHEST 1 VIEW COMPARISON:  Radiograph 02/01/2021, CT 10/24/2022 FINDINGS: Right chest port with tip overlying the mid lower SVC. No pneumothorax. Probable drain projecting over the right breast. Ill-defined opacity at the right lung base typical of atelectasis. Stable heart size and mediastinal contours. No significant pleural effusion. IMPRESSION: 1. Right chest port with tip overlying the mid lower SVC. No pneumothorax. 2. Right lung base atelectasis. Electronically Signed   By: Keith Rake M.D.   On: 12/15/2022 15:50   MM Breast Surgical Specimen  Result Date: 12/15/2022 CLINICAL DATA:  Evaluate specimen EXAM: SPECIMEN RADIOGRAPH OF THE LEFT BREAST COMPARISON:  Previous exam(s).  FINDINGS: Status post excision of the left breast. The radioactive seed and biopsy marker clip are present, completely intact, and were marked for pathology. IMPRESSION: Specimen radiograph of the left breast. Electronically Signed   By: Dorise Bullion III M.D.   On: 12/15/2022 13:27   DG C-Arm 1-60 Min  Result Date: 12/15/2022 CLINICAL DATA:  Port-A-Cath placement. EXAM: DG C-ARM 1-60 MIN COMPARISON:  CT chest 10/24/2022. FINDINGS: C-arm fluoroscopy was provided in the operating room without the presence of a radiologist.33 seconds fluoroscopy time. 5.63 mGy air kerma. One C-arm fluoroscopic images were obtained intraoperatively and are submitted for post operative interpretation. Single spot image of the upper chest demonstrates a right IJ port. The tip of this catheter is not well visualized on this single spot image. Endotracheal tube is in place. Please see intraoperative findings for further detail. IMPRESSION: Intraoperative fluoroscopic guidance provided for Port-A-Cath placement. Electronically Signed   By: Richardean Sale M.D.   On: 12/15/2022 13:07   MM LT RADIOACTIVE SEED LOC MAMMO GUIDE  Result Date: 12/14/2022 CLINICAL DATA:  67 year old female presenting for seed localization of the left breast. Patient has newly diagnosed bilateral breast cancer. EXAM: MAMMOGRAPHIC GUIDED RADIOACTIVE SEED LOCALIZATION OF THE LEFT BREAST COMPARISON:  Previous exam(s). FINDINGS: Patient presents for radioactive seed localization prior to left breast lumpectomy. I met with the patient and we discussed the procedure of seed localization including benefits and alternatives. We discussed the high likelihood of a successful procedure. We discussed the risks of the procedure including infection, bleeding, tissue injury and further surgery. We discussed the low dose of radioactivity involved in the procedure. Informed, written consent was given. The usual  time-out protocol was performed immediately prior to the  procedure. Using mammographic guidance, sterile technique, 1% lidocaine and an I-125 radioactive seed, the ribbon shaped biopsy marking the left breast was localized using a lateral approach. The follow-up mammogram images confirm the seed in the expected location and were marked for Dr. Donne Hazel. Follow-up survey of the patient confirms presence of the radioactive seed. Order number of I-125 seed:  [id number]. Total activity: 0.248 mCi reference Date: November 09, 2022 The patient tolerated the procedure well and was released from the [place]. She was given instructions regarding seed removal. IMPRESSION: Radioactive seed localization left breast. No apparent complications. Electronically Signed   By: Audie Pinto M.D.   On: 12/14/2022 13:37    Anti-infectives: Anti-infectives (From admission, onward)    Start     Dose/Rate Route Frequency Ordered Stop   12/15/22 0915  ceFAZolin (ANCEF) IVPB 2g/100 mL premix        2 g 200 mL/hr over 30 Minutes Intravenous On call to O.R. 12/15/22 0912 12/15/22 1157       Assessment/Plan: POD 1 port, right mastectomy, left lump/sn -can dc home if does ok with breakfast -path pending   Rolm Bookbinder 12/16/2022

## 2022-12-16 NOTE — Discharge Instructions (Signed)
[facility], [place] [phone]  MASTECTOMY: POST OP INSTRUCTIONS Take 400 mg of ibuprofen every 8 hours or 650 mg tylenol every 6 hours for next 72 hours then as needed. Use ice several times daily also. Always review your discharge instruction sheet given to you by the facility where your surgery was performed.  A prescription for pain medication may be given to you upon discharge.  Take your pain medication as prescribed, if needed.  If narcotic pain medicine is not needed, then you may take acetaminophen (Tylenol), naprosyn (Alleve) or ibuprofen (Advil) as needed. Take your usually prescribed medications unless otherwise directed. If you need a refill on your pain medication, please contact your pharmacy.  They will contact our office to request authorization.  Prescriptions will not be filled after 5pm or on week-ends. You should follow a light diet the first 24 hours after surgery.  Resume your normal diet the day after surgery. Most patients will experience some swelling and bruising on the chest and underarm.  Ice packs will help.  Swelling and bruising can take several days to resolve. Wear the bra for 72 hours day and night. After that please wear during the day.  It is common to experience some constipation if taking pain medication after surgery.  Increasing fluid intake and taking a stool softener (such as Colace) will usually help or prevent this problem from occurring.  A mild laxative (Milk of Magnesia or Miralax) should be taken according to package instructions if there are no bowel movements after 48 hours. There is glue and steristrips on your incision. They will come off in the next few weeks.  You may take a shower 48 hours after surgery.  Any sutures will be removed at an office visit DRAINS:  If you have drains in place, it is important to keep a list of the amount of drainage produced each day in your drains.  Before leaving the hospital, you should be instructed  on drain care.  Call our office if you have any questions about your drains. I will remove your drains when they put out less than 30 cc or ml for 2 consecutive days. ACTIVITIES:  You may resume regular (light) daily activities beginning the next day--such as daily self-care, walking, climbing stairs--gradually increasing activities as tolerated.  You may have sexual intercourse when it is comfortable.  Refrain from any heavy lifting or straining until approved by your doctor. You may drive when you are no longer taking prescription pain medication, you can comfortably wear a seatbelt, and you can safely maneuver your car and apply brakes. RETURN TO WORK:  __________________________________________________________ Katie Jacobson should see your doctor in the office for a follow-up appointment approximately 3-5 days after your surgery.  Your doctor's nurse will typically make your follow-up appointment when she calls you with your pathology report.  Expect your pathology report 3-4business days after surgery. OTHER INSTRUCTIONS: ______________________________________________________________________________________________ ____________________________________________________________________________________________ WHEN TO CALL YOUR DR Katie Jacobson: Fever over 101.0 Nausea and/or vomiting Extreme swelling or bruising Continued bleeding from incision. Increased pain, redness, or drainage from the incision. The clinic staff is available to answer your questions during regular business hours.  Please don't hesitate to call and ask to speak to one of the nurses for clinical concerns.  If you have a medical emergency, go to the nearest emergency room or call 911.  A surgeon from [facility] is always on call at the hospital. [address], [place], [place], [place]  [address] ?  [id number], [place], [place]   [address] [phone] ? [phone] ? FAX [phone] Web site:  [email or web address]

## 2022-12-16 NOTE — Progress Notes (Signed)
   12/16/22 1000  Mobility  Activity Transferred from chair to bed;Ambulated with assistance in room  Level of Assistance Minimal assist, patient does 75% or more  Assistive Device None  Distance Ambulated (ft) 5 ft  Activity Response Tolerated well  Mobility Referral Yes  $Mobility charge 1 Mobility   Mobility Specialist Progress Note  Pt requesting to get back in bed. Had no c/o pain. Returned to bed w/ all needs met and call bell in reach.   Katie Jacobson Mobility Specialist  Please contact via SecureChat or Rehab office at [phone]

## 2022-12-19 LAB — SURGICAL PATHOLOGY

## 2022-12-19 NOTE — Discharge Summary (Signed)
Physician Discharge Summary  Patient ID: Katie Jacobson MRN: [id number] DOB/AGE: 65-Aug-1957 73 y.o.  Admit date: 12/15/2022 Discharge [id number]  Admission Diagnoses: Bilateral breast cancer  Discharge Diagnoses:  Principal Problem:   Breast cancer [facility])   Discharged Condition: good  Hospital Course: 89 yof who underwent right mastectomy, port , left lumpectomy and sn biopsy. Doing well following morning ready for dc  Consults: None  Significant Diagnostic Studies: none  Treatments: surgery  Discharge Exam: Blood pressure 112/62, pulse (!) 57, temperature 97.6 F (36.4 C), temperature source Oral, resp. rate 16, height '5\' 1"'$  (1.549 m), weight 99.8 kg, last menstrual period 10/10/1996, SpO2 100 %. Mastectomy without hematoma, drain as expected Left sided incisoin without hematoma/drainage  Disposition: Discharge disposition: 01-Home or Self Care        Allergies as of 12/16/2022       Reactions   Sulfa Antibiotics Other (See Comments)   UNKNOWN        Medication List     TAKE these medications    albuterol 108 (90 Base) MCG/ACT inhaler Commonly known as: VENTOLIN HFA Inhale 1-2 puffs into the lungs every 6 (six) hours as needed for wheezing or shortness of breath.   aspirin 81 MG tablet Take 1 tablet (81 mg total) by mouth daily.   chlorthalidone 25 MG tablet Commonly known as: HYGROTON TAKE 1 TABLET(25 MG) BY MOUTH EVERY OTHER DAY   enalapril 20 MG tablet Commonly known as: VASOTEC TAKE 1 TABLET(20 MG) BY MOUTH AT BEDTIME   fluticasone 50 MCG/ACT nasal spray Commonly known as: FLONASE Place 2 sprays into both nostrils as needed.   methocarbamol 500 MG tablet Commonly known as: ROBAXIN Take 1 tablet (500 mg total) by mouth every 8 (eight) hours as needed for muscle spasms.   metoprolol succinate 100 MG 24 hr tablet Commonly known as: TOPROL-XL Take 1 tablet (100 mg total) by mouth daily. Take with or immediately following a meal. What  changed: when to take this   oxyCODONE 5 MG immediate release tablet Commonly known as: Oxy IR/ROXICODONE Take 1 tablet (5 mg total) by mouth every 4 (four) hours as needed for moderate pain.        Follow-up Information     Rolm Bookbinder, MD Follow up in 2 week(s).   Specialty: General Surgery Contact information: [address] [employer] [place] [address] [phone]                 Signed: Rolm Bookbinder 12/19/2022, 8:57 AM

## 2022-12-20 NOTE — Progress Notes (Signed)
Patient Care Team: Debbrah Alar, NP as PCP - General (Internal Medicine) Leonie Man, MD as PCP - Cardiology (Cardiology) Rolm Bookbinder, MD as Consulting Physician (General Surgery) Eppie Gibson, MD as Attending Physician (Radiation Oncology) Armbruster, Carlota Raspberry, MD as Consulting Physician (Gastroenterology) Benay Pike, MD as Consulting Physician (Hematology and Oncology) Mauro Kaufmann, RN as Oncology Nurse Navigator Rockwell Germany, RN as Oncology Nurse Navigator  DIAGNOSIS: No diagnosis found.  SUMMARY OF ONCOLOGIC HISTORY: Oncology History  Malignant neoplasm of upper-outer quadrant of right breast in female, estrogen receptor negative ([place])  10/24/2014 Mammogram   Right breast: ill-defined mass in the UOQ   10/24/2014 Breast US   Right breast: irregular hypoechoic taller than wide mass in the 11 o'clock location, 4 cm from the nipple, measuring 0.9 x 1.1 x 1.2 cm. Evaluation of the right axilla is negative for adenopathy.   11/12/2014 Initial Biopsy   Right breast needle core bx: Invasive ductal carcinoma, ER- (0%), PR- (0%), HER2/neu negative, Ki67 19%, grade 2-3.   11/20/2014 Procedure   Genetic testing: BreastNext panel Cephus Shelling) reveals no clinically significant variant at ATM, BARD1, BRCA1, BRCA2, BRIP1, CDH1, CHEK2, MRE11A, MUTYH, NBN, NF1, PALB2, PTEN, RAD50, RAD51C, RAD51D, and TP53.   11/21/2014 Breast MRI   Right breast mass measuring up to 1.3 cm without lymphadenopathy or findings to suggest multifocal or multicentric disease. Benign circumscribed oval enhancing mass located posterior medial to the malignancy consistent with fibroadenoma   11/24/2014 Clinical Stage   Stage IA: T1c N0   12/02/2014 - 02/03/2015 Neo-Adjuvant Chemotherapy   Cyclophosphamide and docetaxel x 4 cycles   02/09/2015 Breast MRI   Right breast mass measures 1.0 x 0.7 x 0.5 cm, decreased from 1.4 x 1.4 x 1.0 cm. No additional areas of suspicion in right or left breast.    03/02/2015 Definitive Surgery   Right lumpectomy / SLNB Donne Hazel): IDC, grade 1, with high grade DCIS spanning 1 cmHER2/neu repeated and remains negative (ratio 1.62). 4 LN removed and negative for malignancy (0/4 LN).   03/02/2015 Pathologic Stage   Stage IA: ypT1b ypN0   04/20/2015 - 05/29/2015 Radiation Therapy   Adjuvant RT Isidore Moos): Right Breast  50 Gy over 25 fractions. Right Breast boost 10 Gy over 5 fractions. Total dose: 60 Gy.   07/28/2015 Survivorship   Survivorship visit completed and copy of care plan provided to patient.     CHIEF COMPLIANT: Follow-up post op  INTERVAL HISTORY: Katie Jacobson is a 61 y.r old with  triple negative breast cancer. Most recent mammogram was February, biopsy of right breast, benign changes. She presents to the clinic for a follow-up.   ALLERGIES:  is allergic to sulfa antibiotics.  MEDICATIONS:  Current Outpatient Medications  Medication Sig Dispense Refill   albuterol (VENTOLIN HFA) 108 (90 Base) MCG/ACT inhaler Inhale 1-2 puffs into the lungs every 6 (six) hours as needed for wheezing or shortness of breath. 1 each 0   aspirin 81 MG tablet Take 1 tablet (81 mg total) by mouth daily. 30 tablet 0   chlorthalidone (HYGROTON) 25 MG tablet TAKE 1 TABLET(25 MG) BY MOUTH EVERY OTHER DAY 45 tablet 1   enalapril (VASOTEC) 20 MG tablet TAKE 1 TABLET(20 MG) BY MOUTH AT BEDTIME 90 tablet 1   fluticasone (FLONASE) 50 MCG/ACT nasal spray Place 2 sprays into both nostrils as needed. 16 g 0   methocarbamol (ROBAXIN) 500 MG tablet Take 1 tablet (500 mg total) by mouth every 8 (eight) hours as needed  for muscle spasms. 30 tablet 0   metoprolol succinate (TOPROL-XL) 100 MG 24 hr tablet Take 1 tablet (100 mg total) by mouth daily. Take with or immediately following a meal. (Patient taking differently: Take 100 mg by mouth at bedtime. Take with or immediately following a meal.) 90 tablet 3   oxyCODONE (OXY IR/ROXICODONE) 5 MG immediate release tablet Take 1  tablet (5 mg total) by mouth every 4 (four) hours as needed for moderate pain. 12 tablet 0   No current facility-administered medications for this visit.    PHYSICAL EXAMINATION: ECOG PERFORMANCE STATUS: {CHL ONC ECOG PS:[id number]}  There were no vitals filed for this visit. There were no vitals filed for this visit.  BREAST:*** No palpable masses or nodules in either right or left breasts. No palpable axillary supraclavicular or infraclavicular adenopathy no breast tenderness or nipple discharge. (exam performed in the presence of a chaperone)  LABORATORY DATA:  I have reviewed the data as listed    Latest Ref Rng & Units 12/12/2022    2:00 PM 11/01/2022    2:04 PM 01/28/2022    3:09 PM  CMP  Glucose 70 - 99 mg/dL 89  80  88   BUN 8 - 23 mg/dL 13  13  14    Creatinine 0.44 - 1.00 mg/dL 1.00  0.84  0.90   Sodium 135 - 145 mmol/L 139  143  142   Potassium 3.5 - 5.1 mmol/L 4.0  3.9  4.1   Chloride 98 - 111 mmol/L 103  107  106   CO2 22 - 32 mmol/L 23  28  28    Calcium 8.9 - 10.3 mg/dL 9.1  8.9  9.7   Total Protein 6.0 - 8.3 g/dL  6.5  6.6   Total Bilirubin 0.2 - 1.2 mg/dL  0.5  0.4   Alkaline Phos 39 - 117 U/L  64    AST 0 - 37 U/L  12  13   ALT 0 - 35 U/L  12  14     Lab Results  Component Value Date   WBC 6.9 12/12/2022   HGB 14.3 12/12/2022   HCT 42.3 12/12/2022   MCV 94.8 12/12/2022   PLT 216 12/12/2022   NEUTROABS 3.4 08/16/2021    ASSESSMENT & PLAN:  No problem-specific Assessment & Plan notes found for this encounter.    No orders of the defined types were placed in this encounter.  The patient has a good understanding of the overall plan. she agrees with it. she will call with any problems that may develop before the next visit here. Total time spent: 30 mins including face to face time and time spent for planning, charting and co-ordination of care   Suzzette Righter, [place] 12/20/22    I Gardiner Coins am acting as a Education administrator for [employer]  ***

## 2022-12-26 ENCOUNTER — Encounter: Payer: Self-pay | Admitting: *Deleted

## 2022-12-28 ENCOUNTER — Other Ambulatory Visit: Payer: Self-pay

## 2022-12-28 ENCOUNTER — Inpatient Hospital Stay: Payer: 59 | Attending: Hematology and Oncology | Admitting: Hematology and Oncology

## 2022-12-28 ENCOUNTER — Encounter: Payer: Self-pay | Admitting: Hematology and Oncology

## 2022-12-28 VITALS — BP 136/70 | HR 83 | Temp 97.8°F | Resp 18 | Ht 61.0 in | Wt 218.8 lb

## 2022-12-28 DIAGNOSIS — Z171 Estrogen receptor negative status [ER-]: Secondary | ICD-10-CM | POA: Diagnosis not present

## 2022-12-28 DIAGNOSIS — C50911 Malignant neoplasm of unspecified site of right female breast: Secondary | ICD-10-CM

## 2022-12-28 DIAGNOSIS — N6489 Other specified disorders of breast: Secondary | ICD-10-CM | POA: Insufficient documentation

## 2022-12-28 DIAGNOSIS — C50411 Malignant neoplasm of upper-outer quadrant of right female breast: Secondary | ICD-10-CM | POA: Diagnosis not present

## 2022-12-28 DIAGNOSIS — C50912 Malignant neoplasm of unspecified site of left female breast: Secondary | ICD-10-CM | POA: Diagnosis not present

## 2022-12-28 DIAGNOSIS — Z79899 Other long term (current) drug therapy: Secondary | ICD-10-CM | POA: Insufficient documentation

## 2022-12-28 DIAGNOSIS — Z882 Allergy status to sulfonamides status: Secondary | ICD-10-CM | POA: Insufficient documentation

## 2022-12-28 DIAGNOSIS — Z923 Personal history of irradiation: Secondary | ICD-10-CM | POA: Diagnosis not present

## 2022-12-28 MED ORDER — ONDANSETRON HCL 8 MG PO TABS: 8.0000 mg | ORAL_TABLET | Freq: Three times a day (TID) | ORAL | 1 refills | Status: DC | PRN

## 2022-12-28 MED ORDER — LIDOCAINE-PRILOCAINE 2.5-2.5 % EX CREA: TOPICAL_CREAM | CUTANEOUS | 3 refills | Status: DC

## 2022-12-28 MED ORDER — PROCHLORPERAZINE MALEATE 10 MG PO TABS: 10.0000 mg | ORAL_TABLET | Freq: Four times a day (QID) | ORAL | 1 refills | Status: DC | PRN

## 2022-12-28 NOTE — Progress Notes (Signed)
START ON PATHWAY REGIMEN - Breast     A cycle is every 21 days:     Docetaxel      Cyclophosphamide   **Always confirm dose/schedule in your pharmacy ordering system**  Patient Characteristics: Postoperative without Neoadjuvant Therapy (Pathologic Staging), Invasive Disease, Adjuvant Therapy, HER2 Negative, ER Negative, Node Negative, pT1a-c, N58mi or pT1c or Higher, pN0 Therapeutic Status: Postoperative without Neoadjuvant Therapy (Pathologic Staging) AJCC Grade: G3 AJCC N Category: pN0 AJCC M Category: cM0 ER Status: Negative (-) AJCC 8 Stage Grouping: IIIA HER2 Status: Negative (-) Oncotype Dx Recurrence Score: Not Appropriate AJCC T Category: pT3 PR Status: Negative (-) Intent of Therapy: Curative Intent, Discussed with Patient

## 2022-12-28 NOTE — Assessment & Plan Note (Signed)
11/12/2014: Right breast biopsy T1 cN0 stage Ia triple negative IDC Ki-67 19% 11/12/2014-02/03/2015: TC x 4 neoadjuvant chemotherapy, genetics negative 03/02/2015: Right lumpectomy: Grade 1 IDC negative margins triple negative August 2016: Completed radiation   November 2023: Asymmetry left breast and a possible mass (0.8 cm) and asymmetry in the right breast (1.7 cm and 1 cm) Right breast biopsy: 12:00: Grade 2 IDC ER 2%, PR 0%, Ki-67 50%, HER2 negative 2+ by IHC, 1230: Grade 2 IDC   left breast biopsy 1:00: Grade 2 IDC, ER 0%, PR 0%, Ki-67 95%, HER2 2+ , FISH: Negative   CT CAP and bone scan: 10/24/2022: No evidence of metastatic disease.   Treatment plan: 12/15/2022:Left lumpectomy: Grade 3 IDC 2.2 cm, margins negative, ER 0%, PR 0%, HER2 negative, Ki-67 95%, 0/2 lymph nodes negative Right mastectomy: Grade 3 IDC 6.6 cm with DCIS, margins negative, ER 2%, PR 0%, HER2 negative, Ki-67 50% Adjuvant chemotherapy: With Taxotere and Cytoxan every 3 weeks x 4 ----------------------------------------------------------------------------------------------------------------------------------- Pathology counseling: I discussed the final pathology report of the patient provided  a copy of this report. I discussed the margins as well as lymph node surgeries. We also discussed the final staging along with previously performed ER/PR and HER-2/neu testing.  Return to clinic in 3 weeks to start chemotherapy

## 2022-12-29 DIAGNOSIS — Z6841 Body Mass Index (BMI) 40.0 and over, adult: Secondary | ICD-10-CM | POA: Diagnosis not present

## 2022-12-29 DIAGNOSIS — I10 Essential (primary) hypertension: Secondary | ICD-10-CM | POA: Diagnosis not present

## 2022-12-29 DIAGNOSIS — Z882 Allergy status to sulfonamides status: Secondary | ICD-10-CM | POA: Diagnosis not present

## 2022-12-29 DIAGNOSIS — J301 Allergic rhinitis due to pollen: Secondary | ICD-10-CM | POA: Diagnosis not present

## 2022-12-29 DIAGNOSIS — Z8249 Family history of ischemic heart disease and other diseases of the circulatory system: Secondary | ICD-10-CM | POA: Diagnosis not present

## 2022-12-29 DIAGNOSIS — C50919 Malignant neoplasm of unspecified site of unspecified female breast: Secondary | ICD-10-CM | POA: Diagnosis not present

## 2022-12-29 DIAGNOSIS — Z87891 Personal history of nicotine dependence: Secondary | ICD-10-CM | POA: Diagnosis not present

## 2023-01-02 ENCOUNTER — Encounter: Payer: Self-pay | Admitting: *Deleted

## 2023-01-04 ENCOUNTER — Other Ambulatory Visit: Payer: Self-pay

## 2023-01-04 ENCOUNTER — Inpatient Hospital Stay: Payer: 59

## 2023-01-04 ENCOUNTER — Inpatient Hospital Stay: Payer: 59 | Admitting: Pharmacist

## 2023-01-04 DIAGNOSIS — Z79899 Other long term (current) drug therapy: Secondary | ICD-10-CM | POA: Diagnosis not present

## 2023-01-04 DIAGNOSIS — Z882 Allergy status to sulfonamides status: Secondary | ICD-10-CM | POA: Diagnosis not present

## 2023-01-04 DIAGNOSIS — C50411 Malignant neoplasm of upper-outer quadrant of right female breast: Secondary | ICD-10-CM | POA: Diagnosis not present

## 2023-01-04 DIAGNOSIS — Z171 Estrogen receptor negative status [ER-]: Secondary | ICD-10-CM | POA: Diagnosis not present

## 2023-01-04 DIAGNOSIS — Z923 Personal history of irradiation: Secondary | ICD-10-CM | POA: Diagnosis not present

## 2023-01-04 DIAGNOSIS — N6489 Other specified disorders of breast: Secondary | ICD-10-CM | POA: Diagnosis not present

## 2023-01-04 DIAGNOSIS — C50911 Malignant neoplasm of unspecified site of right female breast: Secondary | ICD-10-CM

## 2023-01-04 NOTE — Progress Notes (Signed)
[place]       Telephone: [phone]?Fax: [phone]   Oncology Clinical Pharmacist Practitioner Initial Assessment  Katie Jacobson is a 67 y.o. female with a diagnosis of breast cancer. They were contacted today via in-person visit. She is accompanied by her husband Katie Jacobson.  Indication/Regimen Docetaxel (Taxotere) and cyclophosphamide (Cytoxan) are being used appropriately for treatment of breast cancer by Dr. Nicholas Lose.      Wt Readings from Last 1 Encounters:  12/28/22 218 lb 12.8 oz (99.2 kg)    Estimated body surface area is 2.07 meters squared as calculated from the following:   Height as of 12/28/22: 5\' 1"  (1.549 m).   Weight as of 12/28/22: 218 lb 12.8 oz (99.2 kg).  The dosing regimen cycle is every 21 days x 4 cycles  Docetaxel (75 mg/m2) on Day 1 Cyclophosphamide (600 mg/m2) on Day 1 Pegfilgrastim (6 mg) on Day 3  It is planned to continue until treatment plan completion or unacceptable toxicity. The tentative start date is: 01/18/23  Dose Modifications No chemotherapy dose reductions at this time. Dr. Lindi Adie did remove the dexamethasone prescription from her treatment plan. Ms. Tumminia is aware.   Allergies Allergies  Allergen Reactions   Sulfa Antibiotics Other (See Comments)    UNKNOWN    Vitals = No vitals or labs were done today for this chemotherapy education visit   Contraindications Contraindications were reviewed? Yes Contraindications to therapy were identified? No   Safety Precautions The following safety precautions were reviewed:  Fever: reviewed the importance of having a thermometer and the Centers for Disease Control and Prevention (CDC) definition of fever which is 100.24F (38C) or higher. Patient should call 24/7 triage at [phone] if experiencing a fever or any other symptoms Decreased white blood cells (WBCs) and increased risk for infection Decreased platelet count and increased risk of bleeding Decreased  hemoglobin, part of the red blood cells that carry iron and oxygen Hair Loss Fatigue Fluid retention or swelling (edema) Peripheral Neuropathy Mouth sores Rash or itchy skin Muscle or joint pain or weakness Nausea or vomiting Diarrhea Nail Changes Hypersensitivity reactions Secondary Malignancies Hemorrhagic cystitis Pneumonitis Handling body fluids and waste Intimacy, sexual activity, contraception, fertility  Medication Reconciliation Current Outpatient Medications  Medication Sig Dispense Refill   albuterol (VENTOLIN HFA) 108 (90 Base) MCG/ACT inhaler Inhale 1-2 puffs into the lungs every 6 (six) hours as needed for wheezing or shortness of breath. 1 each 0   aspirin 81 MG tablet Take 1 tablet (81 mg total) by mouth daily. 30 tablet 0   chlorthalidone (HYGROTON) 25 MG tablet TAKE 1 TABLET(25 MG) BY MOUTH EVERY OTHER DAY 45 tablet 1   enalapril (VASOTEC) 20 MG tablet TAKE 1 TABLET(20 MG) BY MOUTH AT BEDTIME 90 tablet 1   fluticasone (FLONASE) 50 MCG/ACT nasal spray Place 2 sprays into both nostrils as needed. 16 g 0   metoprolol succinate (TOPROL-XL) 100 MG 24 hr tablet Take 1 tablet (100 mg total) by mouth daily. Take with or immediately following a meal. (Patient taking differently: Take 100 mg by mouth at bedtime. Take with or immediately following a meal.) 90 tablet 3   lidocaine-prilocaine (EMLA) cream Apply to affected area once (Patient not taking: Reported on 01/04/2023) 30 g 3   ondansetron (ZOFRAN) 8 MG tablet Take 1 tablet (8 mg total) by mouth every 8 (eight) hours as needed for nausea or vomiting. Start on the third day after chemotherapy. (Patient not taking: Reported on  01/04/2023) 30 tablet 1   prochlorperazine (COMPAZINE) 10 MG tablet Take 1 tablet (10 mg total) by mouth every 6 (six) hours as needed for nausea or vomiting. (Patient not taking: Reported on 01/04/2023) 30 tablet 1   No current facility-administered medications for this visit.   Medication  reconciliation is based on the patient's most recent medication list in the electronic medical record (EMR) including herbal products and OTC medications.   The patient's medication list was reviewed today with the patient? Yes   Drug-drug interactions (DDIs) DDIs were evaluated? Yes Significant DDIs identified? No   Drug-Food Interactions Drug-food interactions were evaluated? Yes Drug-food interactions identified? No   Follow-up Plan  Treatment start date: 01/18/23 Port placement date: 12/15/22 We reviewed the prescriptions, premedications, and treatment regimen with the patient. Possible side effects of the treatment regimen were reviewed and management strategies were discussed.  Can use loperamide as needed for diarrhea, loratadine as needed for G-CSF bone pain, and Senna-S as needed for constipation.  Clinical pharmacy will assist Dr. Nicholas Lose and Katie Jacobson on an as needed basis going forward  Katie Jacobson participated in the discussion, expressed understanding, and voiced agreement with the above plan. All questions were answered to her satisfaction. The patient was advised to contact the clinic at [phone] with any questions or concerns prior to her return visit.   I spent 60 minutes assessing the patient.  Yandell Mcjunkins A. Camille Bal, PharmD, BCOP, CPP  Raina Mina, RPH-CPP, 01/04/2023 2:53 PM  **Disclaimer: This note was dictated with voice recognition software. Similar sounding words can inadvertently be transcribed and this note may contain transcription errors which may not have been corrected upon publication of note.**

## 2023-01-10 ENCOUNTER — Other Ambulatory Visit: Payer: Self-pay

## 2023-01-11 NOTE — Progress Notes (Signed)
Pharmacist Chemotherapy Monitoring - Initial Assessment    Anticipated start date: 01/18/23   The following has been reviewed per standard work regarding the patient's treatment regimen: The patient's diagnosis, treatment plan and drug doses, and organ/hematologic function Lab orders and baseline tests specific to treatment regimen  The treatment plan start date, drug sequencing, and pre-medications Prior authorization status  Patient's documented medication list, including drug-drug interaction screen and prescriptions for anti-emetics and supportive care specific to the treatment regimen The drug concentrations, fluid compatibility, administration routes, and timing of the medications to be used The patient's access for treatment and lifetime cumulative dose history, if applicable  The patient's medication allergies and previous infusion related reactions, if applicable   Changes made to treatment plan:  N/A  Follow up needed:  Pending authorization for treatment    Acquanetta Belling, RPH,  BCPS, BCOP 01/11/2023  3:20 PM

## 2023-01-11 NOTE — Therapy (Signed)
OUTPATIENT PHYSICAL THERAPY BREAST CANCER POST OP FOLLOW UP   Patient Name: Katie Jacobson MRN: [id number] DOB:1956/06/16, 67 y.o., female Today's Date: 01/12/2023  END OF SESSION:  PT End of Session - 01/12/23 1355     Visit Number 2    Number of Visits 14    Date for PT Re-Evaluation 02/23/23    PT Start Time 1400    PT Stop Time 1455    PT Time Calculation (min) 55 min    Activity Tolerance Patient tolerated treatment well    Behavior During Therapy [facility] for tasks assessed/performed             Past Medical History:  Diagnosis Date   Allergy    Arthritis    Breast cancer of upper-outer quadrant of right female breast 11/14/2014   Treated with chemotherapy and radiation   Heart murmur    History of chemotherapy    finished chemo 02/03/2015   History of kidney stones    History of seizures    as a child - unknown cause - states was never on anticonvulsants   Hypertension    states under control with meds., has been on med. x "years"   Mitral valve prolapse 1990   Echo 2023 with no MVP. Mild-mod aortic regurg and mild mitral regurg. 3 yr followup recommended.   Osteoporosis    Personal history of chemotherapy    Personal history of radiation therapy    Seasonal allergies    Past Surgical History:  Procedure Laterality Date   ABDOMINAL HYSTERECTOMY  1998   partial   APPENDECTOMY  1976   BREAST BIOPSY Right 09/30/2022   [place] RT BREAST BX W LOC DEV 1ST LESION IMG BX SPEC US GUIDE 09/30/2022 [facility]   BREAST BIOPSY Left 09/30/2022   [place] LT BREAST BX W LOC DEV 1ST LESION IMG BX SPEC US GUIDE 09/30/2022 [facility]   BREAST BIOPSY Right 09/30/2022   [place] RT BREAST BX W LOC DEV EA ADD LESION IMG BX SPEC US GUIDE 09/30/2022 [facility]   BREAST BIOPSY  12/14/2022   MM LT RADIOACTIVE SEED LOC MAMMO GUIDE 12/14/2022 [facility]   BREAST LUMPECTOMY Right 02/2015   BREAST LUMPECTOMY WITH RADIOACTIVE SEED AND SENTINEL LYMPH NODE BIOPSY Left  12/15/2022   Procedure: LEFT BREAST LUMPECTOMY WITH RADIOACTIVE SEED AND AXILLARY SENTINEL LYMPH NODE BIOPSY;  Surgeon: Rolm Bookbinder, MD;  Location: [place];  Service: General;  Laterality: Left;   COLONOSCOPY  09/30/2016   KNEE ARTHROSCOPY Left 03/26/2008   MASTECTOMY W/ SENTINEL NODE BIOPSY Right 12/15/2022   Procedure: RIGHT MASTECTOMY WITH RIGHT AXILLARY SENTINEL LYMPH NODE BIOPSY;  Surgeon: Rolm Bookbinder, MD;  Location: [place];  Service: General;  Laterality: Right;  180 MIN ROOM 9   PLANTAR'S WART EXCISION Left    x 2   PORT-A-CATH REMOVAL Left 04/06/2015   Procedure: REMOVAL PORT-A-CATH;  Surgeon: Rolm Bookbinder, MD;  Location: [place];  Service: General;  Laterality: Left;   PORTACATH PLACEMENT N/A 11/27/2014   Procedure: INSERTION PORT-A-CATH;  Surgeon: Rolm Bookbinder, MD;  Location: [place];  Service: General;  Laterality: N/A;   PORTACATH PLACEMENT Right 12/15/2022   Procedure: INSERTION PORT-A-CATH;  Surgeon: Rolm Bookbinder, MD;  Location: [place];  Service: General;  Laterality: Right;   RADIOACTIVE SEED GUIDED PARTIAL MASTECTOMY WITH AXILLARY SENTINEL LYMPH NODE BIOPSY Right 03/02/2015   Procedure: RADIOACTIVE SEED GUIDED RIGHT PARTIAL MASTECTOMY WITH RIGHT AXILLARY SENTINEL LYMPH NODE BIOPSY;  Surgeon:  Rolm Bookbinder, MD;  Location: [place];  Service: General;  Laterality: Right;   SALIVARY STONE REMOVAL Right 1972   TONSILLECTOMY  1970s   TRANSTHORACIC ECHOCARDIOGRAM  01/16/2020   EF 60 to 65%.  GRII DD.  No R WMA.  Normal RV size and function.  Rheumatic mitral valve with moderate thickening-hockey-stick appearing.  Moderate MR with mild MS.  Moderate LA dilation.  (Recommend follow-up in 2 to 3 years)   TRANSTHORACIC ECHOCARDIOGRAM  05/31/2022   Normal LV size and function-EF 60 to 65%.  No RWMA. ??  Normal diastolic parameters but elevated LAP?  Normal RV with normal RVP and RAP.  Manage mitral valve with mild  MR and no MS.  Mild to moderate AI with aortic sclerosis but no stenosis.   TUBAL LIGATION  1996   Patient Active Problem List   Diagnosis Date Noted   Breast cancer 12/15/2022   Bilateral malignant neoplasm of breast in female 11/02/2022   Allergic reaction to chemical substance 01/30/2022   Preventative health care 01/28/2022   Cervical radiculopathy 04/20/2021   Primary osteoarthritis of left knee 03/09/2021   Cervical strain 02/09/2021   Sprain of medial collateral ligament of left knee 02/09/2021   Mitral regurgitation due to cusp prolapse 01/02/2020   Genetic testing 01/25/2016   Microscopic hematuria 09/06/2015   Vaginal dryness 12/09/2014   Family history of breast cancer    Malignant neoplasm of upper-outer quadrant of right breast in female, estrogen receptor negative 11/14/2014   Intermittent palpitations    Obesity (BMI 35.0-39.9 without comorbidity)    Hyperlipidemia LDL goal <100 07/07/2011   History of partial seizures 04/01/2011   Asthma 04/01/2011   Mitral valve prolapse 04/01/2011   Hot flashes 04/01/2011   Tobacco abuse 04/01/2011   Essential hypertension 04/01/2011    REFERRING PROVIDER: Benay Pike MD  REFERRING DIAG: Bilateral Breast Cancer  THERAPY DIAG:  Malignant neoplasm of upper-outer quadrant of right breast in female, estrogen receptor negative  Abnormal posture  Rationale for Evaluation and Treatment: Rehabilitation  ONSET DATE: 10/08/2023  SUBJECTIVE:                                                                                                                                                                                           SUBJECTIVE STATEMENT: Surgery went well. Pain is moderate, not real bad, but sleeping is hard. I saw MD this am and he took the steri strips off  Mastectomy incision and that made me sore. The incision is open in several places. I have done the exercises, but the wall climbs are hard  PERTINENT  HISTORY  Pt. Underwent a Right lumpectomy for Gr. 1 IDC on 03/02/2015 with 0/4 LN's. She had neoadjuvant chemo and radiation. In November of 2023 she noted breast asymmetry and patient was diagnosed on 10/07/2022 with right and left breast grade 2 IDC. It measures .8 cm  on the left and a It is Triple Negative with a Ki67 of 95% . Right breast mass is 1.7 cm and 1 cm and Ki 67 of 50%. She had  a right mastectomy and left lumpectomy with SLNB on 12/15/2022. Will start chemo on 01/18/2023 and will then had radiation  PATIENT GOALS:  Reassess how my recovery is going related to arm function, pain, and swelling.  PAIN:  Are you having pain? Yes: NPRS scale: 2-3/10 Pain location: Right chest, Pain description: throbbing, sore Aggravating factors: doctor pulled off steri strips, laying on her sides Relieving factors: laying on back,  PRECAUTIONS: Recent Surgery, bilateral UE Lymphedema risk, Pt was treated previously for right UE lymphedema and has a sleeve, but does not have to wear it now per pt., osteoporosis.  ACTIVITY LEVEL / LEISURE: doing some housework.   OBJECTIVE:   PATIENT SURVEYS:  QUICK DASH: 43%  OBSERVATIONS: Left breast steri strips on breast incision, axillary incision on left well healed. Right mastectomy incision with several open areas where steri strips were pulled off. MD saw today and advised her to use antibiotic. Scab present from drain. Large swelling noted anterior to axilla where compression bra was cutting in to. Foam pad made and placed in bra which alleviated pain and should help with bra cutting in to her  POSTURE:  Forward head, rounded shoulders  LYMPHEDEMA ASSESSMENT:    UPPER EXTREMITY AROM/PROM:   A/PROM RIGHT   eval 11/28/2022 RIGHT 01/12/2023  Shoulder extension 63 56  Shoulder flexion 140 135  Shoulder abduction 135 100  Shoulder internal rotation 65 58  Shoulder external rotation 84 80                          (Blank rows = not tested)   A/PROM  LEFT   eval LEFT 01/12/2023  Shoulder extension 63 63  Shoulder flexion 165 130  Shoulder abduction 162 118  Shoulder internal rotation 73 63  Shoulder external rotation 87 68                          (Blank rows = not tested)   Right UE ROM limited since 1st lumpectomy in 2016   CERVICAL AROM: All within functional limits:        UPPER EXTREMITY STRENGTH: WNL   LYMPHEDEMA ASSESSMENTS:    LANDMARK RIGHT   eval RIGHT 01/12/2023  10 cm proximal to olecranon process 38.2 37.7  Olecranon process 28.5 28.4  10 cm proximal to ulnar styloid process 26.4 25.7  Just proximal to ulnar styloid process 18.05 17.8   Across hand at thumb web space 20.1 19.8  At base of 2nd digit 6.0 6.1  (Blank rows = not tested)   LANDMARK LEFT   eval LEFT 01/12/2023  10 cm proximal to olecranon process 38.4 37.8  Olecranon process 27.9 27.8  10 cm proximal to ulnar styloid process 25.1 25.0  Just proximal to ulnar styloid process 17.4 17.6  Across hand at thumb web space 19.8 19.7  At base of 2nd digit 5.6 5.9  (Blank rows = not tested)    Surgery type/Date: Left lumpectomy and Right mastectomy  12/15/2022, Prior right lumpectomy 03/02/2015 Number of lymph nodes removed: right 2016 0/4, left lumpectomy 0/2 Current/past treatment (chemo, radiation, hormone therapy): prior right chemo and radiation, pending left chemo and radiation Other symptoms:  Heaviness/tightness YesTightness Pain Yes Pitting edema No Infections No Decreased scar mobility Yes Stemmer sign No  PATIENT EDUCATION:  Education details: Foam pads for axilla, and bottom of bra,post op exs, scar massage on healed axillary incision, ABC class Person educated: Patient Education method: Explanation, Demonstration, and Handouts Education comprehension: verbalized understanding and returned demonstration  HOME EXERCISE PROGRAM: Reviewed previously given post op HEP  x 4-5 ea   ASSESSMENT:  CLINICAL IMPRESSION: This 67 year old  female is s/p Right mastectomy and left lumpectomy with SLNB on 12/15/2022. She had a prior right lumpectomy with SLNB in 2016. She will have left adjuvant chemotherapy and radiation. She presents with limitations in bilateral shoulder ROM. There is no sign of UE swelling. Mastectomy incision is open in several places and there is some right axillary swelling noted, likely from where bra was cutting in to her. She seemed to feel better when a foam pad was made for the axillary region. She will benefit from skilled therapy to address ROM deficits, swelling and function.  Pt will benefit from skilled therapeutic intervention to improve on the following deficits: Decreased knowledge of precautions, impaired UE functional use, pain, decreased ROM, postural dysfunction.   PT treatment/interventions: ADL/Self care home management, Therapeutic exercises, Patient/Family education, Self Care, Joint mobilization, Orthotic/Fit training, Manual lymph drainage, scar mobilization, and Manual therapy   GOALS: Goals reviewed with patient? Yes  LONG TERM GOALS:  (STG=LTG)  GOALS Name Target Date  Goal status  1 Pt will demonstrate she has regained full shoulder ROM and function post operatively compared to baselines.  Baseline: 02/23/2023 INITIAL  2 Quick dash no greater than 30% 02/23/2023 INITIAL  3 Able to sleep on either side without disturbance 02/23/2023 INITIAL  4 Pt will attend ABC class and will understand lymphedema precautions and skin care 02/23/2023 INITIAL     PLAN:  PT FREQUENCY/DURATION: 2x/week x 6 weeks  PLAN FOR NEXT SESSION: STM to loosen tight muscles and relax pt., PROM, bilateral shoulders (Right limited since 2016 lumpectomy), check mastectomy incision for healing,, MLD prn for right anterior axillary region   [facility]  [place], [address]  [place] [address]  [phone]  After Breast Cancer Class It is recommended you attend the ABC class to be  educated on lymphedema risk reduction. This class is free of charge and lasts for 1 hour. It is a 1-time class. You will need to download the Webex app either on your phone or computer. We will send you a link the night before or the morning of the class. You should be able to click on that link to join the class. This is not a confidential class. You don't have to turn your camera on, but other participants may be able to see your email address.  Scar massage You can begin gentle scar massage to you incision sites. Gently place one hand on the incision and move the skin (without sliding on the skin) in various directions. Do this for a few minutes and then you can gently massage either coconut oil or vitamin E cream into the scars.  Compression garment You should continue wearing your compression bra until you feel like you no longer have swelling.  Home exercise Program Continue doing the exercises you were given  until you feel like you can do them without feeling any tightness at the end.   Walking Program Studies show that 30 minutes of walking per day (fast enough to elevate your heart rate) can significantly reduce the risk of a cancer recurrence. If you can't walk due to other medical reasons, we encourage you to find another activity you could do (like a stationary bike or water exercise).  Posture After breast cancer surgery, people frequently sit with rounded shoulders posture because it puts their incisions on slack and feels better. If you sit like this and scar tissue forms in that position, you can become very tight and have pain sitting or standing with good posture. Try to be aware of your posture and sit and stand up tall to heal properly.  Follow up PT: It is recommended you return every 3 months for the first 3 years following surgery to be assessed on the SOZO machine for an L-Dex score. This helps prevent clinically significant lymphedema in 95% of patients. These follow up  screens are 10 minute appointments that you are not billed for.  Claris Pong, PT 01/12/2023, 3:04 PM

## 2023-01-12 ENCOUNTER — Ambulatory Visit: Payer: 59 | Attending: Hematology and Oncology

## 2023-01-12 ENCOUNTER — Encounter (HOSPITAL_COMMUNITY): Payer: Self-pay

## 2023-01-12 DIAGNOSIS — C50411 Malignant neoplasm of upper-outer quadrant of right female breast: Secondary | ICD-10-CM | POA: Diagnosis not present

## 2023-01-12 DIAGNOSIS — R293 Abnormal posture: Secondary | ICD-10-CM

## 2023-01-12 DIAGNOSIS — Z171 Estrogen receptor negative status [ER-]: Secondary | ICD-10-CM | POA: Diagnosis not present

## 2023-01-12 DIAGNOSIS — M25612 Stiffness of left shoulder, not elsewhere classified: Secondary | ICD-10-CM

## 2023-01-12 DIAGNOSIS — M25611 Stiffness of right shoulder, not elsewhere classified: Secondary | ICD-10-CM | POA: Insufficient documentation

## 2023-01-12 NOTE — Patient Instructions (Signed)
     [facility]  [address], [address]  [place] [address]  [phone]  After Breast Cancer Class  (January 23, 2023) It is recommended you attend the ABC class to be educated on lymphedema risk reduction. This class is free of charge and lasts for 1 hour. It is a 1-time class. You will need to download the Webex app either on your phone or computer. We will send you a link the night before or the morning of the class. You should be able to click on that link to join the class. This is not a confidential class. You don't have to turn your camera on, but other participants may be able to see your email address.  Scar massage You can begin gentle scar massage to you incision sites. Gently place one hand on the incision and move the skin (without sliding on the skin) in various directions. Do this for a few minutes and then you can gently massage either coconut oil or vitamin E cream into the scars.  Compression garment You should continue wearing your compression bra until you feel like you no longer have swelling.  Home exercise Program Continue doing the exercises you were given until you feel like you can do them without feeling any tightness at the end.   Walking Program Studies show that 30 minutes of walking per day (fast enough to elevate your heart rate) can significantly reduce the risk of a cancer recurrence. If you can't walk due to other medical reasons, we encourage you to find another activity you could do (like a stationary bike or water exercise).  Posture After breast cancer surgery, people frequently sit with rounded shoulders posture because it puts their incisions on slack and feels better. If you sit like this and scar tissue forms in that position, you can become very tight and have pain sitting or standing with good posture. Try to be aware of your posture and sit and stand up tall to heal properly.

## 2023-01-16 ENCOUNTER — Ambulatory Visit: Payer: 59

## 2023-01-16 DIAGNOSIS — M25611 Stiffness of right shoulder, not elsewhere classified: Secondary | ICD-10-CM | POA: Diagnosis not present

## 2023-01-16 DIAGNOSIS — R293 Abnormal posture: Secondary | ICD-10-CM

## 2023-01-16 DIAGNOSIS — Z171 Estrogen receptor negative status [ER-]: Secondary | ICD-10-CM | POA: Diagnosis not present

## 2023-01-16 DIAGNOSIS — M25612 Stiffness of left shoulder, not elsewhere classified: Secondary | ICD-10-CM

## 2023-01-16 DIAGNOSIS — C50411 Malignant neoplasm of upper-outer quadrant of right female breast: Secondary | ICD-10-CM

## 2023-01-16 NOTE — Progress Notes (Signed)
Patient Care Team: [name], NP as PCP - General (Internal Medicine) Marykay Lex, MD as PCP - Cardiology (Cardiology) Emelia Loron, MD as Consulting Physician (General Surgery) Lonie Peak, MD as Attending Physician (Radiation Oncology) Armbruster, Willaim Rayas, MD as Consulting Physician (Gastroenterology) Pershing Proud, RN as Oncology Nurse Navigator Donnelly Angelica, RN as Oncology Nurse Navigator [name], MD as Consulting Physician (Hematology and Oncology)  DIAGNOSIS: No diagnosis found.  SUMMARY OF ONCOLOGIC HISTORY: Oncology History  Malignant neoplasm of upper-outer quadrant of right breast in female, estrogen receptor negative  10/24/2014 Mammogram   Right breast: ill-defined mass in the UOQ   10/24/2014 Breast US   Right breast: irregular hypoechoic taller than wide mass in the 11 o'clock location, 4 cm from the nipple, measuring 0.9 x 1.1 x 1.2 cm. Evaluation of the right axilla is negative for adenopathy.   11/12/2014 Initial Biopsy   Right breast needle core bx: Invasive ductal carcinoma, ER- (0%), PR- (0%), HER2/neu negative, Ki67 19%, grade 2-3.   11/20/2014 Procedure   Genetic testing: BreastNext panel [place]) reveals no clinically significant variant at ATM, BARD1, BRCA1, BRCA2, BRIP1, CDH1, CHEK2, MRE11A, MUTYH, NBN, NF1, PALB2, PTEN, RAD50, RAD51C, RAD51D, and TP53.   11/21/2014 Breast MRI   Right breast mass measuring up to 1.3 cm without lymphadenopathy or findings to suggest multifocal or multicentric disease. Benign circumscribed oval enhancing mass located posterior medial to the malignancy consistent with fibroadenoma   11/24/2014 Clinical Stage   Stage IA: T1c N0   12/02/2014 - 02/03/2015 Neo-Adjuvant Chemotherapy   Cyclophosphamide and docetaxel x 4 cycles   02/09/2015 Breast MRI   Right breast mass measures 1.0 x 0.7 x 0.5 cm, decreased from 1.4 x 1.4 x 1.0 cm. No additional areas of suspicion in right or left breast.   03/02/2015  Definitive Surgery   Right lumpectomy / SLNB Dwain Sarna): IDC, grade 1, with high grade DCIS spanning 1 cmHER2/neu repeated and remains negative (ratio 1.62). 4 LN removed and negative for malignancy (0/4 LN).   03/02/2015 Pathologic Stage   Stage IA: ypT1b ypN0   04/20/2015 - 05/29/2015 Radiation Therapy   Adjuvant RT Basilio Cairo): Right Breast  50 Gy over 25 fractions. Right Breast boost 10 Gy over 5 fractions. Total dose: 60 Gy.   07/28/2015 Survivorship   Survivorship visit completed and copy of care plan provided to patient.   12/15/2022 Surgery   Left lumpectomy: Grade 3 IDC 2.2 cm, margins negative, ER 0%, PR 0%, HER2 negative, Ki-67 95%, 0/2 lymph nodes negative Right mastectomy: Grade 3 IDC 6.6 cm with DCIS, margins negative, ER 2%, PR 0%, HER2 negative, Ki-67 50%   12/28/2022 Cancer Staging   Staging form: Breast, AJCC 7th Edition - Pathologic: Stage IIB (T3, N0, cM0) - Signed by [name], MD on 12/28/2022 Stage prefix: Initial diagnosis Laterality: Bilateral Histologic grade (G): G3 Paget's disease: Negative Estrogen receptor status: Negative Progesterone receptor status: Negative   Bilateral malignant neoplasm of breast in female  11/02/2022 Initial Diagnosis   Bilateral malignant neoplasm of breast in female [facility])   01/18/2023 -  Chemotherapy   Patient is on Treatment Plan : BREAST TC q21d       CHIEF COMPLIANT:   INTERVAL HISTORY: Katie Jacobson is a   ALLERGIES:  is allergic to sulfa antibiotics.  MEDICATIONS:  Current Outpatient Medications  Medication Sig Dispense Refill   albuterol (VENTOLIN HFA) 108 (90 Base) MCG/ACT inhaler Inhale 1-2 puffs into the lungs every 6 (six) hours  as needed for wheezing or shortness of breath. 1 each 0   aspirin 81 MG tablet Take 1 tablet (81 mg total) by mouth daily. 30 tablet 0   chlorthalidone (HYGROTON) 25 MG tablet TAKE 1 TABLET(25 MG) BY MOUTH EVERY OTHER DAY 45 tablet 1   enalapril (VASOTEC) 20 MG tablet TAKE 1 TABLET(20  MG) BY MOUTH AT BEDTIME 90 tablet 1   fluticasone (FLONASE) 50 MCG/ACT nasal spray Place 2 sprays into both nostrils as needed. 16 g 0   lidocaine-prilocaine (EMLA) cream Apply to affected area once (Patient not taking: Reported on 01/04/2023) 30 g 3   metoprolol succinate (TOPROL-XL) 100 MG 24 hr tablet Take 1 tablet (100 mg total) by mouth daily. Take with or immediately following a meal. (Patient taking differently: Take 100 mg by mouth at bedtime. Take with or immediately following a meal.) 90 tablet 3   ondansetron (ZOFRAN) 8 MG tablet Take 1 tablet (8 mg total) by mouth every 8 (eight) hours as needed for nausea or vomiting. Start on the third day after chemotherapy. (Patient not taking: Reported on 01/04/2023) 30 tablet 1   prochlorperazine (COMPAZINE) 10 MG tablet Take 1 tablet (10 mg total) by mouth every 6 (six) hours as needed for nausea or vomiting. (Patient not taking: Reported on 01/04/2023) 30 tablet 1   No current facility-administered medications for this visit.    PHYSICAL EXAMINATION: ECOG PERFORMANCE STATUS: {CHL ONC ECOG PS:[id number]}  There were no vitals filed for this visit. There were no vitals filed for this visit.  BREAST:*** No palpable masses or nodules in either right or left breasts. No palpable axillary supraclavicular or infraclavicular adenopathy no breast tenderness or nipple discharge. (exam performed in the presence of a chaperone)  LABORATORY DATA:  I have reviewed the data as listed    Latest Ref Rng & Units 12/12/2022    2:00 PM 11/01/2022    2:04 PM 01/28/2022    3:09 PM  CMP  Glucose 70 - 99 mg/dL 89  80  88   BUN 8 - 23 mg/dL 13  13  14    Creatinine 0.44 - 1.00 mg/dL 4.58  5.92  9.24   Sodium 135 - 145 mmol/L 139  143  142   Potassium 3.5 - 5.1 mmol/L 4.0  3.9  4.1   Chloride 98 - 111 mmol/L 103  107  106   CO2 22 - 32 mmol/L 23  28  28    Calcium 8.9 - 10.3 mg/dL 9.1  8.9  9.7   Total Protein 6.0 - 8.3 g/dL  6.5  6.6   Total Bilirubin 0.2 - 1.2  mg/dL  0.5  0.4   Alkaline Phos 39 - 117 U/L  64    AST 0 - 37 U/L  12  13   ALT 0 - 35 U/L  12  14     Lab Results  Component Value Date   WBC 6.9 12/12/2022   HGB 14.3 12/12/2022   HCT 42.3 12/12/2022   MCV 94.8 12/12/2022   PLT 216 12/12/2022   NEUTROABS 3.4 08/16/2021    ASSESSMENT & PLAN:  No problem-specific Assessment & Plan notes found for this encounter.    No orders of the defined types were placed in this encounter.  The patient has a good understanding of the overall plan. she agrees with it. she will call with any problems that may develop before the next visit here. Total time spent: 30 mins including face to face time  and time spent for planning, charting and co-ordination of care   Sherlyn LickDeritra L Seleen Jacobson, CMA 01/16/23    I Janan Ridgeeritra, Katie Jacobson am acting as a Neurosurgeonscribe for The ServiceMaster CompanyDr.Vinay Gudena  ***

## 2023-01-16 NOTE — Therapy (Signed)
OUTPATIENT PHYSICAL THERAPY BREAST CANCER TREATMENT   Patient Name: Katie Jacobson MRN: [id number] DOB:01-Jul-1956, 67 y.o., female Today's Date: 01/16/2023  END OF SESSION:  PT End of Session - 01/16/23 1103     Visit Number 3    Number of Visits 14    Date for PT Re-Evaluation 02/23/23    PT Start Time 1102    PT Stop Time 1205    PT Time Calculation (min) 63 min    Activity Tolerance Patient tolerated treatment well    Behavior During Therapy [facility] for tasks assessed/performed             Past Medical History:  Diagnosis Date   Allergy    Arthritis    Breast cancer of upper-outer quadrant of right female breast 11/14/2014   Treated with chemotherapy and radiation   Heart murmur    History of chemotherapy    finished chemo 02/03/2015   History of kidney stones    History of seizures    as a child - unknown cause - states was never on anticonvulsants   Hypertension    states under control with meds., has been on med. x "years"   Mitral valve prolapse 1990   Echo 2023 with no MVP. Mild-mod aortic regurg and mild mitral regurg. 3 yr followup recommended.   Osteoporosis    Personal history of chemotherapy    Personal history of radiation therapy    Seasonal allergies    Past Surgical History:  Procedure Laterality Date   ABDOMINAL HYSTERECTOMY  1998   partial   APPENDECTOMY  1976   BREAST BIOPSY Right 09/30/2022   US RT BREAST BX W LOC DEV 1ST LESION IMG BX SPEC US GUIDE 09/30/2022 [facility]   BREAST BIOPSY Left 09/30/2022   US LT BREAST BX W LOC DEV 1ST LESION IMG BX SPEC US GUIDE 09/30/2022 [facility]   BREAST BIOPSY Right 09/30/2022   US RT BREAST BX W LOC DEV EA ADD LESION IMG BX SPEC US GUIDE 09/30/2022 [facility]   BREAST BIOPSY  12/14/2022   MM LT RADIOACTIVE SEED LOC MAMMO GUIDE 12/14/2022 [facility]   BREAST LUMPECTOMY Right 02/2015   BREAST LUMPECTOMY WITH RADIOACTIVE SEED AND SENTINEL LYMPH NODE BIOPSY Left 12/15/2022    Procedure: LEFT BREAST LUMPECTOMY WITH RADIOACTIVE SEED AND AXILLARY SENTINEL LYMPH NODE BIOPSY;  Surgeon: Emelia LoronWakefield, Matthew, MD;  Location: [facility];  Service: General;  Laterality: Left;   COLONOSCOPY  09/30/2016   KNEE ARTHROSCOPY Left 03/26/2008   MASTECTOMY W/ SENTINEL NODE BIOPSY Right 12/15/2022   Procedure: RIGHT MASTECTOMY WITH RIGHT AXILLARY SENTINEL LYMPH NODE BIOPSY;  Surgeon: Emelia LoronWakefield, Matthew, MD;  Location: [facility];  Service: General;  Laterality: Right;  180 MIN ROOM 9   PLANTAR'S WART EXCISION Left    x 2   PORT-A-CATH REMOVAL Left 04/06/2015   Procedure: REMOVAL PORT-A-CATH;  Surgeon: Emelia LoronMatthew Wakefield, MD;  Location: [facility];  Service: General;  Laterality: Left;   PORTACATH PLACEMENT N/A 11/27/2014   Procedure: INSERTION PORT-A-CATH;  Surgeon: Emelia LoronMatthew Wakefield, MD;  Location: [facility];  Service: General;  Laterality: N/A;   PORTACATH PLACEMENT Right 12/15/2022   Procedure: INSERTION PORT-A-CATH;  Surgeon: Emelia LoronWakefield, Matthew, MD;  [facility];  Service: General;  Laterality: Right;   RADIOACTIVE SEED GUIDED PARTIAL MASTECTOMY WITH AXILLARY SENTINEL LYMPH NODE BIOPSY Right 03/02/2015   Procedure: RADIOACTIVE SEED GUIDED RIGHT PARTIAL MASTECTOMY WITH RIGHT AXILLARY SENTINEL LYMPH NODE BIOPSY;  Surgeon: Emelia LoronMatthew Wakefield, MD;  Location: [facility];  Service: General;  Laterality: Right;   SALIVARY STONE REMOVAL Right 1972   TONSILLECTOMY  1970s   TRANSTHORACIC ECHOCARDIOGRAM  01/16/2020   EF 60 to 65%.  GRII DD.  No R WMA.  Normal RV size and function.  Rheumatic mitral valve with moderate thickening-hockey-stick appearing.  Moderate MR with mild MS.  Moderate LA dilation.  (Recommend follow-up in 2 to 3 years)   TRANSTHORACIC ECHOCARDIOGRAM  05/31/2022   Normal LV size and function-EF 60 to 65%.  No RWMA. ??  Normal diastolic parameters but elevated LAP?  Normal RV with normal RVP and RAP.  Manage mitral valve with mild MR and no  MS.  Mild to moderate AI with aortic sclerosis but no stenosis.   TUBAL LIGATION  1996   Patient Active Problem List   Diagnosis Date Noted   Breast cancer 12/15/2022   Bilateral malignant neoplasm of breast in female 11/02/2022   Allergic reaction to chemical substance 01/30/2022   Preventative health care 01/28/2022   Cervical radiculopathy 04/20/2021   Primary osteoarthritis of left knee 03/09/2021   Cervical strain 02/09/2021   Sprain of medial collateral ligament of left knee 02/09/2021   Mitral regurgitation due to cusp prolapse 01/02/2020   Genetic testing 01/25/2016   Microscopic hematuria 09/06/2015   Vaginal dryness 12/09/2014   Family history of breast cancer    Malignant neoplasm of upper-outer quadrant of right breast in female, estrogen receptor negative 11/14/2014   Intermittent palpitations    Obesity (BMI 35.0-39.9 without comorbidity)    Hyperlipidemia LDL goal <100 07/07/2011   History of partial seizures 04/01/2011   Asthma 04/01/2011   Mitral valve prolapse 04/01/2011   Hot flashes 04/01/2011   Tobacco abuse 04/01/2011   Essential hypertension 04/01/2011    REFERRING PROVIDER: Rachel Moulds MD  REFERRING DIAG: Bilateral Breast Cancer  THERAPY DIAG:  Malignant neoplasm of upper-outer quadrant of right breast in female, estrogen receptor negative  Abnormal posture  Stiffness of left shoulder, not elsewhere classified  Stiffness of right shoulder, not elsewhere classified  Rationale for Evaluation and Treatment: Rehabilitation  ONSET DATE: 10/08/2023  SUBJECTIVE:                                                                                                                                                                                           SUBJECTIVE STATEMENT: I can't come 2x/wk, my co pay is $80! I start my chemo next week and I don't know how I'll feel after yet so don't want to schedule any more appts at this time. The foam she gave me  last time  has helped. I just really want the swelling at my Rt armpit to get better now.   PERTINENT HISTORY Pt. Underwent a Right lumpectomy for Gr. 1 IDC on 03/02/2015 with 0/4 LN's. She had neoadjuvant chemo and radiation. In November of 2023 she noted breast asymmetry and patient was diagnosed on 10/07/2022 with right and left breast grade 2 IDC. It measures .8 cm  on the left and a It is Triple Negative with a Ki67 of 95% . Right breast mass is 1.7 cm and 1 cm and Ki 67 of 50%. She had  a right mastectomy and left lumpectomy with SLNB on 12/15/2022. Will start chemo on 01/18/2023 and will then had radiation  PATIENT GOALS:  Reassess how my recovery is going related to arm function, pain, and swelling.  PAIN:  Are you having pain? No, I just feel sore in general in my Rt upper quadrant and the bottom of the compression bra is a bit tight too.   PRECAUTIONS: Recent Surgery, bilateral UE Lymphedema risk, Pt was treated previously for right UE lymphedema and has a sleeve, but does not have to wear it now per pt., osteoporosis.  ACTIVITY LEVEL / LEISURE: doing some housework.   OBJECTIVE:   PATIENT SURVEYS:  QUICK DASH: 43%  OBSERVATIONS: Left breast steri strips on breast incision, axillary incision on left well healed. Right mastectomy incision with several open areas where steri strips were pulled off. MD saw today and advised her to use antibiotic. Scab present from drain. Large swelling noted anterior to axilla where compression bra was cutting in to. Foam pad made and placed in bra which alleviated pain and should help with bra cutting in to her  POSTURE:  Forward head, rounded shoulders  LYMPHEDEMA ASSESSMENT:    UPPER EXTREMITY AROM/PROM:   A/PROM RIGHT   eval 11/28/2022 RIGHT 01/12/2023  Shoulder extension 63 56  Shoulder flexion 140 135  Shoulder abduction 135 100  Shoulder internal rotation 65 58  Shoulder external rotation 84 80                          (Blank rows = not  tested)   A/PROM LEFT   eval LEFT 01/12/2023  Shoulder extension 63 63  Shoulder flexion 165 130  Shoulder abduction 162 118  Shoulder internal rotation 73 63  Shoulder external rotation 87 68                          (Blank rows = not tested)   Right UE ROM limited since 1st lumpectomy in 2016   CERVICAL AROM: All within functional limits:        UPPER EXTREMITY STRENGTH: WNL   LYMPHEDEMA ASSESSMENTS:    LANDMARK RIGHT   eval RIGHT 01/12/2023  10 cm proximal to olecranon process 38.2 37.7  Olecranon process 28.5 28.4  10 cm proximal to ulnar styloid process 26.4 25.7  Just proximal to ulnar styloid process 18.05 17.8   Across hand at thumb web space 20.1 19.8  At base of 2nd digit 6.0 6.1  (Blank rows = not tested)   LANDMARK LEFT   eval LEFT 01/12/2023  10 cm proximal to olecranon process 38.4 37.8  Olecranon process 27.9 27.8  10 cm proximal to ulnar styloid process 25.1 25.0  Just proximal to ulnar styloid process 17.4 17.6  Across hand at thumb web space 19.8 19.7  At base of 2nd digit 5.6  5.9  (Blank rows = not tested)    Surgery type/Date: Left lumpectomy and Right mastectomy 12/15/2022, Prior right lumpectomy 03/02/2015 Number of lymph nodes removed: right 2016 0/4, left lumpectomy 0/2 Current/past treatment (chemo, radiation, hormone therapy): prior right chemo and radiation, pending left chemo and radiation Other symptoms:  Heaviness/tightness YesTightness Pain Yes Pitting edema No Infections No Decreased scar mobility Yes Stemmer sign No  TODAY'S TREATMENT 01/16/23: Manual Therapy P/ROM to Rt>Lt shoulders into flexion, abd and D2 with scapular depression throughout MFR along bil axillae during P/ROM and to areas of tightness MLD in supine: Short neck, superficial and deep abdominals, Rt inguinal nodes and Rt axillo-inguinal anastomosis then focused on Rt axillary edema redirecting towards pathway.  Issued chip pack in TG soft for pt to wear at her Rt  axilla edema when she is sitting for prolonged periods like when watching TV.    PATIENT EDUCATION:  Education details: Foam pads for axilla, and bottom of bra,post op exs, scar massage on healed axillary incision, ABC class Person educated: Patient Education method: Explanation, Demonstration, and Handouts Education comprehension: verbalized understanding and returned demonstration  HOME EXERCISE PROGRAM: Reviewed previously given post op HEP  x 4-5 ea   ASSESSMENT:  CLINICAL IMPRESSION: Pt has been wearing compression foam "when it stays in place" issued at last session and reports this has improved the comfort of her compression bra. Also issued chip pack for her to wear in axilla at area of swelling at times of prolonged sitting. She has begun daily walking since she was here last and reports initially bil LE fatigue but that this has improved with her daily 5 min walks 2x/day and is working on progressing this as able. Encouraged pt that this can not only aid with her recovery from upcoming chemo but help to possibly reduce her risk of recurrence. Today focused on manual therapy working to decrease her bil upper quadrant tightness. Did not progress her HEP as her Rt mastectomy incision is still open in several areas. Also we spoke of her working towards smoking cessation or at least decreasing as this can hinder her rate of healing. Pt able to verbalize good understanding of all today. She plans to return once her incision is closed so we can progress her further as her ROM at this time is functional and she will cont stretching.   Pt will benefit from skilled therapeutic intervention to improve on the following deficits: Decreased knowledge of precautions, impaired UE functional use, pain, decreased ROM, postural dysfunction.   PT treatment/interventions: ADL/Self care home management, Therapeutic exercises, Patient/Family education, Self Care, Joint mobilization, Orthotic/Fit training,  Manual lymph drainage, scar mobilization, and Manual therapy   GOALS: Goals reviewed with patient? Yes  LONG TERM GOALS:  (STG=LTG)  GOALS Name Target Date  Goal status  1 Pt will demonstrate she has regained full shoulder ROM and function post operatively compared to baselines.  Baseline: 02/23/2023 INITIAL  2 Quick dash no greater than 30% 02/23/2023 INITIAL  3 Able to sleep on either side without disturbance 02/23/2023 INITIAL  4 Pt will attend ABC class and will understand lymphedema precautions and skin care 02/23/2023 INITIAL     PLAN:  PT FREQUENCY/DURATION: 2x/week x 6 weeks  PLAN FOR NEXT SESSION: STM to loosen tight muscles and relax pt., PROM, bilateral shoulders (Right limited since 2016 lumpectomy), check mastectomy incision for healing,, MLD prn for right anterior axillary region   [facility]  [address], [address]  [place]  [place] [address]  [phone]) [phone]  Hermenia Bers, PTA 01/16/2023, 1:29 PM

## 2023-01-17 ENCOUNTER — Encounter: Payer: Self-pay | Admitting: Hematology and Oncology

## 2023-01-17 MED FILL — Dexamethasone Sodium Phosphate Inj 100 MG/10ML: INTRAMUSCULAR | Qty: 1 | Status: AC

## 2023-01-17 NOTE — Progress Notes (Signed)
Pt started treatment again and would like to apply for a 2nd [employer].  She will bring proof of income on 01/18/23.  If approved I will give her an expense sheet and my card for any questions or concerns she may have in the future.

## 2023-01-18 ENCOUNTER — Encounter: Payer: Self-pay | Admitting: Hematology and Oncology

## 2023-01-18 ENCOUNTER — Inpatient Hospital Stay: Payer: 59 | Attending: Hematology and Oncology

## 2023-01-18 ENCOUNTER — Other Ambulatory Visit: Payer: Self-pay

## 2023-01-18 ENCOUNTER — Inpatient Hospital Stay: Payer: 59

## 2023-01-18 ENCOUNTER — Inpatient Hospital Stay (HOSPITAL_BASED_OUTPATIENT_CLINIC_OR_DEPARTMENT_OTHER): Payer: 59 | Admitting: Hematology and Oncology

## 2023-01-18 VITALS — BP 146/80 | HR 75 | Temp 97.3°F | Resp 18 | Ht 61.0 in | Wt 221.0 lb

## 2023-01-18 VITALS — BP 125/88 | HR 63 | Temp 98.2°F | Resp 17

## 2023-01-18 DIAGNOSIS — F17210 Nicotine dependence, cigarettes, uncomplicated: Secondary | ICD-10-CM | POA: Insufficient documentation

## 2023-01-18 DIAGNOSIS — Z833 Family history of diabetes mellitus: Secondary | ICD-10-CM | POA: Insufficient documentation

## 2023-01-18 DIAGNOSIS — Z9049 Acquired absence of other specified parts of digestive tract: Secondary | ICD-10-CM | POA: Insufficient documentation

## 2023-01-18 DIAGNOSIS — C50911 Malignant neoplasm of unspecified site of right female breast: Secondary | ICD-10-CM

## 2023-01-18 DIAGNOSIS — Z8249 Family history of ischemic heart disease and other diseases of the circulatory system: Secondary | ICD-10-CM | POA: Insufficient documentation

## 2023-01-18 DIAGNOSIS — Z9221 Personal history of antineoplastic chemotherapy: Secondary | ICD-10-CM | POA: Diagnosis not present

## 2023-01-18 DIAGNOSIS — Z95828 Presence of other vascular implants and grafts: Secondary | ICD-10-CM

## 2023-01-18 DIAGNOSIS — N179 Acute kidney failure, unspecified: Secondary | ICD-10-CM | POA: Diagnosis not present

## 2023-01-18 DIAGNOSIS — R110 Nausea: Secondary | ICD-10-CM | POA: Insufficient documentation

## 2023-01-18 DIAGNOSIS — Z79630 Long term (current) use of alkylating agent: Secondary | ICD-10-CM | POA: Diagnosis not present

## 2023-01-18 DIAGNOSIS — Z803 Family history of malignant neoplasm of breast: Secondary | ICD-10-CM | POA: Diagnosis not present

## 2023-01-18 DIAGNOSIS — R21 Rash and other nonspecific skin eruption: Secondary | ICD-10-CM | POA: Diagnosis not present

## 2023-01-18 DIAGNOSIS — Z87442 Personal history of urinary calculi: Secondary | ICD-10-CM | POA: Diagnosis not present

## 2023-01-18 DIAGNOSIS — R197 Diarrhea, unspecified: Secondary | ICD-10-CM | POA: Insufficient documentation

## 2023-01-18 DIAGNOSIS — E876 Hypokalemia: Secondary | ICD-10-CM | POA: Diagnosis not present

## 2023-01-18 DIAGNOSIS — R112 Nausea with vomiting, unspecified: Secondary | ICD-10-CM | POA: Insufficient documentation

## 2023-01-18 DIAGNOSIS — Z90710 Acquired absence of both cervix and uterus: Secondary | ICD-10-CM | POA: Insufficient documentation

## 2023-01-18 DIAGNOSIS — Z171 Estrogen receptor negative status [ER-]: Secondary | ICD-10-CM | POA: Diagnosis not present

## 2023-01-18 DIAGNOSIS — N6489 Other specified disorders of breast: Secondary | ICD-10-CM | POA: Insufficient documentation

## 2023-01-18 DIAGNOSIS — Z882 Allergy status to sulfonamides status: Secondary | ICD-10-CM | POA: Diagnosis not present

## 2023-01-18 DIAGNOSIS — Z7289 Other problems related to lifestyle: Secondary | ICD-10-CM | POA: Diagnosis not present

## 2023-01-18 DIAGNOSIS — Z923 Personal history of irradiation: Secondary | ICD-10-CM | POA: Insufficient documentation

## 2023-01-18 DIAGNOSIS — Z823 Family history of stroke: Secondary | ICD-10-CM | POA: Insufficient documentation

## 2023-01-18 DIAGNOSIS — Z79899 Other long term (current) drug therapy: Secondary | ICD-10-CM | POA: Diagnosis not present

## 2023-01-18 DIAGNOSIS — Z5111 Encounter for antineoplastic chemotherapy: Secondary | ICD-10-CM | POA: Insufficient documentation

## 2023-01-18 DIAGNOSIS — C50912 Malignant neoplasm of unspecified site of left female breast: Secondary | ICD-10-CM | POA: Diagnosis not present

## 2023-01-18 DIAGNOSIS — C50411 Malignant neoplasm of upper-outer quadrant of right female breast: Secondary | ICD-10-CM | POA: Diagnosis not present

## 2023-01-18 DIAGNOSIS — R5383 Other fatigue: Secondary | ICD-10-CM | POA: Insufficient documentation

## 2023-01-18 DIAGNOSIS — Z79633 Long term (current) use of mitotic inhibitor: Secondary | ICD-10-CM | POA: Insufficient documentation

## 2023-01-18 DIAGNOSIS — Z83719 Family history of colon polyps, unspecified: Secondary | ICD-10-CM | POA: Insufficient documentation

## 2023-01-18 LAB — CBC WITH DIFFERENTIAL (CANCER CENTER ONLY)
Abs Immature Granulocytes: 0.01 10*3/uL (ref 0.00–0.07)
Basophils Absolute: 0 10*3/uL (ref 0.0–0.1)
Basophils Relative: 1 %
Eosinophils Absolute: 0.3 10*3/uL (ref 0.0–0.5)
Eosinophils Relative: 5 %
HCT: 37.6 % (ref 36.0–46.0)
Hemoglobin: 13.3 g/dL (ref 12.0–15.0)
Immature Granulocytes: 0 %
Lymphocytes Relative: 27 %
Lymphs Abs: 1.8 10*3/uL (ref 0.7–4.0)
MCH: 32.8 pg (ref 26.0–34.0)
MCHC: 35.4 g/dL (ref 30.0–36.0)
MCV: 92.6 fL (ref 80.0–100.0)
Monocytes Absolute: 0.6 10*3/uL (ref 0.1–1.0)
Monocytes Relative: 9 %
Neutro Abs: 3.8 10*3/uL (ref 1.7–7.7)
Neutrophils Relative %: 58 %
Platelet Count: 185 10*3/uL (ref 150–400)
RBC: 4.06 MIL/uL (ref 3.87–5.11)
RDW: 15.1 % (ref 11.5–15.5)
WBC Count: 6.5 10*3/uL (ref 4.0–10.5)
nRBC: 0 % (ref 0.0–0.2)

## 2023-01-18 LAB — CMP (CANCER CENTER ONLY)
ALT: 11 U/L (ref 0–44)
AST: 11 U/L — ABNORMAL LOW (ref 15–41)
Albumin: 3.7 g/dL (ref 3.5–5.0)
Alkaline Phosphatase: 65 U/L (ref 38–126)
Anion gap: 7 (ref 5–15)
BUN: 15 mg/dL (ref 8–23)
CO2: 27 mmol/L (ref 22–32)
Calcium: 9.2 mg/dL (ref 8.9–10.3)
Chloride: 106 mmol/L (ref 98–111)
Creatinine: 0.84 mg/dL (ref 0.44–1.00)
GFR, Estimated: >60 mL/min (ref >60)
Glucose, Bld: 86 mg/dL (ref 70–99)
Potassium: 3.6 mmol/L (ref 3.5–5.1)
Sodium: 140 mmol/L (ref 135–145)
Total Bilirubin: 0.5 mg/dL (ref 0.3–1.2)
Total Protein: 7 g/dL (ref 6.5–8.1)

## 2023-01-18 MED ORDER — HEPARIN SOD (PORK) LOCK FLUSH 100 UNIT/ML IV SOLN
500.0000 [IU] | Freq: Once | INTRAVENOUS | Status: AC | PRN
  Administered 2023-01-18: 500 [IU]

## 2023-01-18 MED ORDER — SODIUM CHLORIDE 0.9 % IV SOLN
75.0000 mg/m2 | Freq: Once | INTRAVENOUS | Status: AC
  Administered 2023-01-18: 160 mg via INTRAVENOUS
  Filled 2023-01-18: qty 16

## 2023-01-18 MED ORDER — PALONOSETRON HCL INJECTION 0.25 MG/5ML
0.2500 mg | Freq: Once | INTRAVENOUS | Status: AC
  Administered 2023-01-18: 0.25 mg via INTRAVENOUS
  Filled 2023-01-18: qty 5

## 2023-01-18 MED ORDER — SODIUM CHLORIDE 0.9 % IV SOLN
10.0000 mg | Freq: Once | INTRAVENOUS | Status: AC
  Administered 2023-01-18: 10 mg via INTRAVENOUS
  Filled 2023-01-18: qty 10

## 2023-01-18 MED ORDER — SODIUM CHLORIDE 0.9% FLUSH
10.0000 mL | INTRAVENOUS | Status: AC | PRN
  Administered 2023-01-18: 10 mL

## 2023-01-18 MED ORDER — SODIUM CHLORIDE 0.9% FLUSH
10.0000 mL | INTRAVENOUS | Status: DC | PRN
  Administered 2023-01-18: 10 mL

## 2023-01-18 MED ORDER — SODIUM CHLORIDE 0.9 % IV SOLN: Freq: Once | INTRAVENOUS | Status: AC

## 2023-01-18 MED ORDER — SODIUM CHLORIDE 0.9 % IV SOLN
600.0000 mg/m2 | Freq: Once | INTRAVENOUS | Status: AC
  Administered 2023-01-18: 1240 mg via INTRAVENOUS
  Filled 2023-01-18: qty 62

## 2023-01-18 NOTE — Assessment & Plan Note (Addendum)
11/12/2014: Right breast biopsy T1 cN0 stage Ia triple negative IDC Ki-67 19% 11/12/2014-02/03/2015: TC x 4 neoadjuvant chemotherapy, genetics negative 03/02/2015: Right lumpectomy: Grade 1 IDC negative margins triple negative August 2016: Completed radiation   November 2023: Asymmetry left breast and a possible mass (0.8 cm) and asymmetry in the right breast (1.7 cm and 1 cm) Right breast biopsy: 12:00: Grade 2 IDC ER 2%, PR 0%, Ki-67 50%, HER2 negative 2+ by IHC, 1230: Grade 2 IDC   left breast biopsy 1:00: Grade 2 IDC, ER 0%, PR 0%, Ki-67 95%, HER2 2+ , FISH: Negative   CT CAP and bone scan: 10/24/2022: No evidence of metastatic disease.   Treatment plan: 12/15/2022:Left lumpectomy: Grade 3 IDC 2.2 cm, margins negative, ER 0%, PR 0%, HER2 negative, Ki-67 95%, 0/2 lymph nodes negative Right mastectomy: Grade 3 IDC 6.6 cm with DCIS, margins negative, ER 2%, PR 0%, HER2 negative, Ki-67 50% Adjuvant chemotherapy: With Taxotere and Cytoxan every 3 weeks x 4 ----------------------------------------------------------------------------------------------------------------------------------- Current treatment: Taxotere Cytoxan cycle 1 Labs reviewed, chemo education completed, chemo consent obtained, antiemetics were reviewed Return to clinic in 1 week for toxicity check  Patient is extremely anxious about going through chemotherapy.

## 2023-01-18 NOTE — Progress Notes (Signed)
Pt is approved for the $1000 [employer] grant.  

## 2023-01-18 NOTE — Patient Instructions (Signed)
[facility]  Discharge Instructions: Thank you for choosing [facility] to provide your oncology and hematology care.   If you have a lab appointment with the Cancer Center, please go directly to the Cancer Center and check in at the registration area.   Wear comfortable clothing and clothing appropriate for easy access to any Portacath or PICC line.   We strive to give you quality time with your provider. You may need to reschedule your appointment if you arrive late (15 or more minutes).  Arriving late affects you and other patients whose appointments are after yours.  Also, if you miss three or more appointments without notifying the office, you may be dismissed from the clinic at the provider's discretion.      For prescription refill requests, have your pharmacy contact our office and allow 72 hours for refills to be completed.    Today you received the following chemotherapy and/or immunotherapy agents: docetaxel cytoxan      To help prevent nausea and vomiting after your treatment, we encourage you to take your nausea medication as directed.  BELOW ARE SYMPTOMS THAT SHOULD BE REPORTED IMMEDIATELY: *FEVER GREATER THAN 100.4 F (38 C) OR HIGHER *CHILLS OR SWEATING *NAUSEA AND VOMITING THAT IS NOT CONTROLLED WITH YOUR NAUSEA MEDICATION *UNUSUAL SHORTNESS OF BREATH *UNUSUAL BRUISING OR BLEEDING *URINARY PROBLEMS (pain or burning when urinating, or frequent urination) *BOWEL PROBLEMS (unusual diarrhea, constipation, pain near the anus) TENDERNESS IN MOUTH AND THROAT WITH OR WITHOUT PRESENCE OF ULCERS (sore throat, sores in mouth, or a toothache) UNUSUAL RASH, SWELLING OR PAIN  UNUSUAL VAGINAL DISCHARGE OR ITCHING   Items with * indicate a potential emergency and should be followed up as soon as possible or go to the Emergency Department if any problems should occur.  Please show the CHEMOTHERAPY ALERT CARD or IMMUNOTHERAPY ALERT CARD  at check-in to the Emergency Department and triage nurse.  Should you have questions after your visit or need to cancel or reschedule your appointment, please contact [facility]  Dept: [phone]  and follow the prompts.  Office hours are 8:00 a.m. to 4:30 p.m. Monday - Friday. Please note that voicemails left after 4:00 p.m. may not be returned until the following business day.  We are closed weekends and major holidays. You have access to a nurse at all times for urgent questions. Please call the main number to the clinic Dept: [phone] and follow the prompts.   For any non-urgent questions, you may also contact your provider using MyChart. We now offer e-Visits for anyone 79 and older to request care online for non-urgent symptoms. For details visit [email or web address].   Also download the MyChart app! Go to the app store, search "MyChart", open the app, select [place], and log in with your MyChart username and password.  Docetaxel Injection What is this medication? DOCETAXEL (doe se TAX el) treats some types of cancer. It works by slowing down the growth of cancer cells. This medicine may be used for other purposes; ask your health care provider or pharmacist if you have questions. COMMON BRAND NAME(S): Docefrez, Taxotere What should I tell my care team before I take this medication? They need to know if you have any of these conditions: Kidney disease Liver disease Low white blood cell levels Tingling of the fingers or toes or other nerve disorder An unusual or allergic reaction to docetaxel, polysorbate 80, other medications, foods, dyes,  or preservatives Pregnant or trying to get pregnant Breast-feeding How should I use this medication? This medication is injected into a vein. It is given by your care team in a hospital or clinic setting. Talk to your care team about the use of this medication in children. Special care may be  needed. Overdosage: If you think you have taken too much of this medicine contact a poison control center or emergency room at once. NOTE: This medicine is only for you. Do not share this medicine with others. What if I miss a dose? Keep appointments for follow-up doses. It is important not to miss your dose. Call your care team if you are unable to keep an appointment. What may interact with this medication? Do not take this medication with any of the following: Live virus vaccines This medication may also interact with the following: Certain antibiotics, such as clarithromycin, telithromycin Certain antivirals for HIV or hepatitis Certain medications for fungal infections, such as itraconazole, ketoconazole, voriconazole Grapefruit juice Nefazodone Supplements, such as St. John's wort This list may not describe all possible interactions. Give your health care provider a list of all the medicines, herbs, non-prescription drugs, or dietary supplements you use. Also tell them if you smoke, drink alcohol, or use illegal drugs. Some items may interact with your medicine. What should I watch for while using this medication? This medication may make you feel generally unwell. This is not uncommon as chemotherapy can affect healthy cells as well as cancer cells. Report any side effects. Continue your course of treatment even though you feel ill unless your care team tells you to stop. You may need blood work done while you are taking this medication. This medication can cause serious side effects and infusion reactions. To reduce the risk, your care team may give you other medications to take before receiving this one. Be sure to follow the directions from your care team. This medication may increase your risk of getting an infection. Call your care team for advice if you get a fever, chills, sore throat, or other symptoms of a cold or flu. Do not treat yourself. Try to avoid being around people who  are sick. Avoid taking medications that contain aspirin, acetaminophen, ibuprofen, naproxen, or ketoprofen unless instructed by your care team. These medications may hide a fever. Be careful brushing or flossing your teeth or using a toothpick because you may get an infection or bleed more easily. If you have any dental work done, tell your dentist you are receiving this medication. Some products may contain alcohol. Ask your care team if this medication contains alcohol. Be sure to tell all care teams you are taking this medicine. Certain medications, like metronidazole and disulfiram, can cause an unpleasant reaction when taken with alcohol. The reaction includes flushing, headache, nausea, vomiting, sweating, and increased thirst. The reaction can last from 30 minutes to several hours. This medication may affect your coordination, reaction time, or judgement. Do not drive or operate machinery until you know how this medication affects you. Sit up or stand slowly to reduce the risk of dizzy or fainting spells. Drinking alcohol with this medication can increase the risk of these side effects. Talk to your care team about your risk of cancer. You may be more at risk for certain types of cancer if you take this medication. Talk to your care team if you wish to become pregnant or think you might be pregnant. This medication can cause serious birth defects if taken during  pregnancy or if you get pregnant within 2 months after stopping therapy. A negative pregnancy test is required before starting this medication. A reliable form of contraception is recommended while taking this medication and for 2 months after stopping it. Talk to your care team about reliable forms of contraception. Do not breast-feed while taking this medication and for 1 week after stopping therapy. Use a condom during sex and for 4 months after stopping therapy. Tell your care team right away if you think your partner might be pregnant.  This medication can cause serious birth defects. This medication may cause infertility. Talk to your care team if you are concerned about your fertility. What side effects may I notice from receiving this medication? Side effects that you should report to your care team as soon as possible: Allergic reactions--skin rash, itching, hives, swelling of the face, lips, tongue, or throat Change in vision such as blurry vision, seeing halos around lights, vision loss Infection--fever, chills, cough, or sore throat Infusion reactions--chest pain, shortness of breath or trouble breathing, feeling faint or lightheaded Low red blood cell level--unusual weakness or fatigue, dizziness, headache, trouble breathing Pain, tingling, or numbness in the hands or feet Painful swelling, warmth, or redness of the skin, blisters or sores at the infusion site Redness, blistering, peeling, or loosening of the skin, including inside the mouth Sudden or severe stomach pain, bloody diarrhea, fever, nausea, vomiting Swelling of the ankles, hands, or feet Tumor lysis syndrome (TLS)--nausea, vomiting, diarrhea, decrease in the amount of urine, dark urine, unusual weakness or fatigue, confusion, muscle pain or cramps, fast or irregular heartbeat, joint pain Unusual bruising or bleeding Side effects that usually do not require medical attention (report to your care team if they continue or are bothersome): Change in nail shape, thickness, or color Change in taste Hair loss Increased tears This list may not describe all possible side effects. Call your doctor for medical advice about side effects. You may report side effects to FDA at 1-800-FDA-1088. Where should I keep my medication? This medication is given in a hospital or clinic. It will not be stored at home. NOTE: This sheet is a summary. It may not cover all possible information. If you have questions about this medicine, talk to your doctor, pharmacist, or health care  provider.  2023 Elsevier/Gold Standard (2007-11-17 00:00:00)  Cyclophosphamide Injection What is this medication? CYCLOPHOSPHAMIDE (sye kloe FOSS fa mide) treats some types of cancer. It works by slowing down the growth of cancer cells. This medicine may be used for other purposes; ask your health care provider or pharmacist if you have questions. COMMON BRAND NAME(S): Cyclophosphamide, Cytoxan, Neosar What should I tell my care team before I take this medication? They need to know if you have any of these conditions: Heart disease Irregular heartbeat or rhythm Infection Kidney problems Liver disease Low blood cell levels (white cells, platelets, or red blood cells) Lung disease Previous radiation Trouble passing urine An unusual or allergic reaction to cyclophosphamide, other medications, foods, dyes, or preservatives Pregnant or trying to get pregnant Breast-feeding How should I use this medication? This medication is injected into a vein. It is given by your care team in a hospital or clinic setting. Talk to your care team about the use of this medication in children. Special care may be needed. Overdosage: If you think you have taken too much of this medicine contact a poison control center or emergency room at once. NOTE: This medicine is only for you. Do  not share this medicine with others. What if I miss a dose? Keep appointments for follow-up doses. It is important not to miss your dose. Call your care team if you are unable to keep an appointment. What may interact with this medication? Amphotericin B Amiodarone Azathioprine Certain antivirals for HIV or hepatitis Certain medications for blood pressure, such as enalapril, lisinopril, quinapril Cyclosporine Diuretics Etanercept Indomethacin Medications that relax muscles Metronidazole Natalizumab Tamoxifen Warfarin This list may not describe all possible interactions. Give your health care provider a list of all  the medicines, herbs, non-prescription drugs, or dietary supplements you use. Also tell them if you smoke, drink alcohol, or use illegal drugs. Some items may interact with your medicine. What should I watch for while using this medication? This medication may make you feel generally unwell. This is not uncommon as chemotherapy can affect healthy cells as well as cancer cells. Report any side effects. Continue your course of treatment even though you feel ill unless your care team tells you to stop. You may need blood work while you are taking this medication. This medication may increase your risk of getting an infection. Call your care team for advice if you get a fever, chills, sore throat, or other symptoms of a cold or flu. Do not treat yourself. Try to avoid being around people who are sick. Avoid taking medications that contain aspirin, acetaminophen, ibuprofen, naproxen, or ketoprofen unless instructed by your care team. These medications may hide a fever. Be careful brushing or flossing your teeth or using a toothpick because you may get an infection or bleed more easily. If you have any dental work done, tell your dentist you are receiving this medication. Drink water or other fluids as directed. Urinate often, even at night. Some products may contain alcohol. Ask your care team if this medication contains alcohol. Be sure to tell all care teams you are taking this medicine. Certain medicines, like metronidazole and disulfiram, can cause an unpleasant reaction when taken with alcohol. The reaction includes flushing, headache, nausea, vomiting, sweating, and increased thirst. The reaction can last from 30 minutes to several hours. Talk to your care team if you wish to become pregnant or think you might be pregnant. This medication can cause serious birth defects if taken during pregnancy and for 1 year after the last dose. A negative pregnancy test is required before starting this medication. A  reliable form of contraception is recommended while taking this medication and for 1 year after the last dose. Talk to your care team about reliable forms of contraception. Do not father a child while taking this medication and for 4 months after the last dose. Use a condom during this time period. Do not breast-feed while taking this medication or for 1 week after the last dose. This medication may cause infertility. Talk to your care team if you are concerned about your fertility. Talk to your care team about your risk of cancer. You may be more at risk for certain types of cancer if you take this medication. What side effects may I notice from receiving this medication? Side effects that you should report to your care team as soon as possible: Allergic reactions--skin rash, itching, hives, swelling of the face, lips, tongue, or throat Dry cough, shortness of breath or trouble breathing Heart failure--shortness of breath, swelling of the ankles, feet, or hands, sudden weight gain, unusual weakness or fatigue Heart muscle inflammation--unusual weakness or fatigue, shortness of breath, chest pain, fast or irregular  heartbeat, dizziness, swelling of the ankles, feet, or hands Heart rhythm changes--fast or irregular heartbeat, dizziness, feeling faint or lightheaded, chest pain, trouble breathing Infection--fever, chills, cough, sore throat, wounds that don't heal, pain or trouble when passing urine, general feeling of discomfort or being unwell Kidney injury--decrease in the amount of urine, swelling of the ankles, hands, or feet Liver injury--right upper belly pain, loss of appetite, nausea, light-colored stool, dark yellow or brown urine, yellowing skin or eyes, unusual weakness or fatigue Low red blood cell level--unusual weakness or fatigue, dizziness, headache, trouble breathing Low sodium level--muscle weakness, fatigue, dizziness, headache, confusion Red or dark brown urine Unusual bruising or  bleeding Side effects that usually do not require medical attention (report to your care team if they continue or are bothersome): Hair loss Irregular menstrual cycles or spotting Loss of appetite Nausea Pain, redness, or swelling with sores inside the mouth or throat Vomiting This list may not describe all possible side effects. Call your doctor for medical advice about side effects. You may report side effects to FDA at 1-800-FDA-1088. Where should I keep my medication? This medication is given in a hospital or clinic. It will not be stored at home. NOTE: This sheet is a summary. It may not cover all possible information. If you have questions about this medicine, talk to your doctor, pharmacist, or health care provider.  2023 Elsevier/Gold Standard (2021-11-16 00:00:00)

## 2023-01-19 ENCOUNTER — Telehealth: Payer: Self-pay | Admitting: *Deleted

## 2023-01-19 NOTE — Telephone Encounter (Signed)
-----   Message from Frutoso Schatz, RN sent at 01/18/2023 12:20 PM EDT ----- Regarding: follow up 1st time Dr Gardenia Phlegm 01/19/23

## 2023-01-20 ENCOUNTER — Other Ambulatory Visit: Payer: Self-pay

## 2023-01-20 ENCOUNTER — Inpatient Hospital Stay: Payer: 59

## 2023-01-20 VITALS — BP 127/70 | HR 74 | Temp 98.1°F | Resp 16

## 2023-01-20 DIAGNOSIS — C50911 Malignant neoplasm of unspecified site of right female breast: Secondary | ICD-10-CM

## 2023-01-20 DIAGNOSIS — Z5111 Encounter for antineoplastic chemotherapy: Secondary | ICD-10-CM | POA: Diagnosis not present

## 2023-01-20 MED ORDER — PEGFILGRASTIM-JMDB 6 MG/0.6ML ~~LOC~~ SOSY
6.0000 mg | PREFILLED_SYRINGE | Freq: Once | SUBCUTANEOUS | Status: AC
  Administered 2023-01-20: 6 mg via SUBCUTANEOUS
  Filled 2023-01-20: qty 0.6

## 2023-01-20 NOTE — Patient Instructions (Signed)

## 2023-01-23 ENCOUNTER — Other Ambulatory Visit: Payer: Self-pay | Admitting: *Deleted

## 2023-01-23 DIAGNOSIS — C50911 Malignant neoplasm of unspecified site of right female breast: Secondary | ICD-10-CM

## 2023-01-23 NOTE — Progress Notes (Signed)
Patient Care Team: [name], NP as PCP - General (Internal Medicine) Marykay Lex, MD as PCP - Cardiology (Cardiology) Emelia Loron, MD as Consulting Physician (General Surgery) Lonie Peak, MD as Attending Physician (Radiation Oncology) Armbruster, Willaim Rayas, MD as Consulting Physician (Gastroenterology) Pershing Proud, RN as Oncology Nurse Navigator Donnelly Angelica, RN as Oncology Nurse Navigator [name], MD as Consulting Physician (Hematology and Oncology)  DIAGNOSIS: No diagnosis found.  SUMMARY OF ONCOLOGIC HISTORY: Oncology History  Malignant neoplasm of upper-outer quadrant of right breast in female, estrogen receptor negative  10/24/2014 Mammogram   Right breast: ill-defined mass in the UOQ   10/24/2014 Breast US   Right breast: irregular hypoechoic taller than wide mass in the 11 o'clock location, 4 cm from the nipple, measuring 0.9 x 1.1 x 1.2 cm. Evaluation of the right axilla is negative for adenopathy.   11/12/2014 Initial Biopsy   Right breast needle core bx: Invasive ductal carcinoma, ER- (0%), PR- (0%), HER2/neu negative, Ki67 19%, grade 2-3.   11/20/2014 Procedure   Genetic testing: BreastNext panel [place] [employer]) reveals no clinically significant variant at ATM, BARD1, BRCA1, BRCA2, BRIP1, CDH1, CHEK2, MRE11A, MUTYH, NBN, NF1, PALB2, PTEN, RAD50, RAD51C, RAD51D, and TP53.   11/21/2014 Breast MRI   Right breast mass measuring up to 1.3 cm without lymphadenopathy or findings to suggest multifocal or multicentric disease. Benign circumscribed oval enhancing mass located posterior medial to the malignancy consistent with fibroadenoma   11/24/2014 Clinical Stage   Stage IA: T1c N0   12/02/2014 - 02/03/2015 Neo-Adjuvant Chemotherapy   Cyclophosphamide and docetaxel x 4 cycles   02/09/2015 Breast MRI   Right breast mass measures 1.0 x 0.7 x 0.5 cm, decreased from 1.4 x 1.4 x 1.0 cm. No additional areas of suspicion in right or left breast.   03/02/2015  Definitive Surgery   Right lumpectomy / SLNB Dwain Sarna): IDC, grade 1, with high grade DCIS spanning 1 cmHER2/neu repeated and remains negative (ratio 1.62). 4 LN removed and negative for malignancy (0/4 LN).   03/02/2015 Pathologic Stage   Stage IA: ypT1b ypN0   04/20/2015 - 05/29/2015 Radiation Therapy   Adjuvant RT Basilio Cairo): Right Breast  50 Gy over 25 fractions. Right Breast boost 10 Gy over 5 fractions. Total dose: 60 Gy.   07/28/2015 Survivorship   Survivorship visit completed and copy of care plan provided to patient.   12/15/2022 Surgery   Left lumpectomy: Grade 3 IDC 2.2 cm, margins negative, ER 0%, PR 0%, HER2 negative, Ki-67 95%, 0/2 lymph nodes negative Right mastectomy: Grade 3 IDC 6.6 cm with DCIS, margins negative, ER 2%, PR 0%, HER2 negative, Ki-67 50%   12/28/2022 Cancer Staging   Staging form: Breast, AJCC 7th Edition - Pathologic: Stage IIB (T3, N0, cM0) - Signed by [name], MD on 12/28/2022 Stage prefix: Initial diagnosis Laterality: Bilateral Histologic grade (G): G3 Paget's disease: Negative Estrogen receptor status: Negative Progesterone receptor status: Negative   Bilateral malignant neoplasm of breast in female  11/02/2022 Initial Diagnosis   Bilateral malignant neoplasm of breast in female [facility])   01/18/2023 -  Chemotherapy   Patient is on Treatment Plan : BREAST TC q21d       CHIEF COMPLIANT:   INTERVAL HISTORY: Katie Jacobson is a   ALLERGIES:  is allergic to sulfa antibiotics.  MEDICATIONS:  Current Outpatient Medications  Medication Sig Dispense Refill   albuterol (VENTOLIN HFA) 108 (90 Base) MCG/ACT inhaler Inhale 1-2 puffs into the lungs every 6 (six) hours  as needed for wheezing or shortness of breath. 1 each 0   aspirin 81 MG tablet Take 1 tablet (81 mg total) by mouth daily. 30 tablet 0   chlorthalidone (HYGROTON) 25 MG tablet TAKE 1 TABLET(25 MG) BY MOUTH EVERY OTHER DAY 45 tablet 1   enalapril (VASOTEC) 20 MG tablet TAKE 1 TABLET(20  MG) BY MOUTH AT BEDTIME 90 tablet 1   fluticasone (FLONASE) 50 MCG/ACT nasal spray Place 2 sprays into both nostrils as needed. 16 g 0   lidocaine-prilocaine (EMLA) cream Apply to affected area once 30 g 3   metoprolol succinate (TOPROL-XL) 100 MG 24 hr tablet Take 1 tablet (100 mg total) by mouth daily. Take with or immediately following a meal. (Patient taking differently: Take 100 mg by mouth at bedtime. Take with or immediately following a meal.) 90 tablet 3   ondansetron (ZOFRAN) 8 MG tablet Take 1 tablet (8 mg total) by mouth every 8 (eight) hours as needed for nausea or vomiting. Start on the third day after chemotherapy. 30 tablet 1   prochlorperazine (COMPAZINE) 10 MG tablet Take 1 tablet (10 mg total) by mouth every 6 (six) hours as needed for nausea or vomiting. 30 tablet 1   No current facility-administered medications for this visit.    PHYSICAL EXAMINATION: ECOG PERFORMANCE STATUS: {CHL ONC ECOG PS:[phone]}  There were no vitals filed for this visit. There were no vitals filed for this visit.  BREAST:*** No palpable masses or nodules in either right or left breasts. No palpable axillary supraclavicular or infraclavicular adenopathy no breast tenderness or nipple discharge. (exam performed in the presence of a chaperone)  LABORATORY DATA:  I have reviewed the data as listed    Latest Ref Rng & Units 01/18/2023   10:41 AM 12/12/2022    2:00 PM 11/01/2022    2:04 PM  CMP  Glucose 70 - 99 mg/dL 86  89  80   BUN 8 - 23 mg/dL 15  13  13    Creatinine 0.44 - 1.00 mg/dL 9.38  1.01  7.51   Sodium 135 - 145 mmol/L 140  139  143   Potassium 3.5 - 5.1 mmol/L 3.6  4.0  3.9   Chloride 98 - 111 mmol/L 106  103  107   CO2 22 - 32 mmol/L 27  23  28    Calcium 8.9 - 10.3 mg/dL 9.2  9.1  8.9   Total Protein 6.5 - 8.1 g/dL 7.0   6.5   Total Bilirubin 0.3 - 1.2 mg/dL 0.5   0.5   Alkaline Phos 38 - 126 U/L 65   64   AST 15 - 41 U/L 11   12   ALT 0 - 44 U/L 11   12     Lab Results   Component Value Date   WBC 6.5 01/18/2023   HGB 13.3 01/18/2023   HCT 37.6 01/18/2023   MCV 92.6 01/18/2023   PLT 185 01/18/2023   NEUTROABS 3.8 01/18/2023    ASSESSMENT & PLAN:  No problem-specific Assessment & Plan notes found for this encounter.    No orders of the defined types were placed in this encounter.  The patient has a good understanding of the overall plan. she agrees with it. she will call with any problems that may develop before the next visit here. Total time spent: 30 mins including face to face time and time spent for planning, charting and co-ordination of care   Sherlyn Lick, CMA 01/23/23  I Gardiner Coins am acting as a Education administrator for [employer]  ***

## 2023-01-24 ENCOUNTER — Other Ambulatory Visit: Payer: Self-pay

## 2023-01-24 ENCOUNTER — Inpatient Hospital Stay: Payer: 59

## 2023-01-24 ENCOUNTER — Other Ambulatory Visit: Payer: Self-pay | Admitting: *Deleted

## 2023-01-24 ENCOUNTER — Inpatient Hospital Stay: Payer: 59 | Admitting: Hematology and Oncology

## 2023-01-24 VITALS — BP 117/80 | HR 73 | Resp 17

## 2023-01-24 VITALS — BP 122/77 | HR 88 | Temp 97.5°F | Resp 18 | Ht 61.0 in | Wt 212.6 lb

## 2023-01-24 DIAGNOSIS — Z171 Estrogen receptor negative status [ER-]: Secondary | ICD-10-CM

## 2023-01-24 DIAGNOSIS — Z95828 Presence of other vascular implants and grafts: Secondary | ICD-10-CM

## 2023-01-24 DIAGNOSIS — Z923 Personal history of irradiation: Secondary | ICD-10-CM

## 2023-01-24 DIAGNOSIS — C50411 Malignant neoplasm of upper-outer quadrant of right female breast: Secondary | ICD-10-CM

## 2023-01-24 DIAGNOSIS — K521 Toxic gastroenteritis and colitis: Secondary | ICD-10-CM | POA: Diagnosis present

## 2023-01-24 DIAGNOSIS — Z841 Family history of disorders of kidney and ureter: Secondary | ICD-10-CM

## 2023-01-24 DIAGNOSIS — I10 Essential (primary) hypertension: Secondary | ICD-10-CM | POA: Diagnosis present

## 2023-01-24 DIAGNOSIS — C50911 Malignant neoplasm of unspecified site of right female breast: Secondary | ICD-10-CM

## 2023-01-24 DIAGNOSIS — Z83719 Family history of colon polyps, unspecified: Secondary | ICD-10-CM

## 2023-01-24 DIAGNOSIS — F17210 Nicotine dependence, cigarettes, uncomplicated: Secondary | ICD-10-CM | POA: Diagnosis present

## 2023-01-24 DIAGNOSIS — Z810 Family history of intellectual disabilities: Secondary | ICD-10-CM

## 2023-01-24 DIAGNOSIS — Z7982 Long term (current) use of aspirin: Secondary | ICD-10-CM

## 2023-01-24 DIAGNOSIS — R112 Nausea with vomiting, unspecified: Principal | ICD-10-CM | POA: Diagnosis present

## 2023-01-24 DIAGNOSIS — D72829 Elevated white blood cell count, unspecified: Secondary | ICD-10-CM | POA: Diagnosis present

## 2023-01-24 DIAGNOSIS — Z803 Family history of malignant neoplasm of breast: Secondary | ICD-10-CM

## 2023-01-24 DIAGNOSIS — Z9013 Acquired absence of bilateral breasts and nipples: Secondary | ICD-10-CM

## 2023-01-24 DIAGNOSIS — Z823 Family history of stroke: Secondary | ICD-10-CM

## 2023-01-24 DIAGNOSIS — Z6841 Body Mass Index (BMI) 40.0 and over, adult: Secondary | ICD-10-CM

## 2023-01-24 DIAGNOSIS — N179 Acute kidney failure, unspecified: Secondary | ICD-10-CM | POA: Diagnosis present

## 2023-01-24 DIAGNOSIS — E860 Dehydration: Secondary | ICD-10-CM | POA: Diagnosis present

## 2023-01-24 DIAGNOSIS — Z87442 Personal history of urinary calculi: Secondary | ICD-10-CM

## 2023-01-24 DIAGNOSIS — I080 Rheumatic disorders of both mitral and aortic valves: Secondary | ICD-10-CM | POA: Diagnosis present

## 2023-01-24 DIAGNOSIS — Z79899 Other long term (current) drug therapy: Secondary | ICD-10-CM

## 2023-01-24 DIAGNOSIS — Z882 Allergy status to sulfonamides status: Secondary | ICD-10-CM

## 2023-01-24 DIAGNOSIS — M810 Age-related osteoporosis without current pathological fracture: Secondary | ICD-10-CM | POA: Diagnosis present

## 2023-01-24 DIAGNOSIS — E876 Hypokalemia: Secondary | ICD-10-CM | POA: Diagnosis present

## 2023-01-24 DIAGNOSIS — X58XXXA Exposure to other specified factors, initial encounter: Secondary | ICD-10-CM | POA: Diagnosis present

## 2023-01-24 DIAGNOSIS — Z90712 Acquired absence of cervix with remaining uterus: Secondary | ICD-10-CM

## 2023-01-24 DIAGNOSIS — Z8249 Family history of ischemic heart disease and other diseases of the circulatory system: Secondary | ICD-10-CM

## 2023-01-24 DIAGNOSIS — Z833 Family history of diabetes mellitus: Secondary | ICD-10-CM

## 2023-01-24 DIAGNOSIS — T451X5A Adverse effect of antineoplastic and immunosuppressive drugs, initial encounter: Secondary | ICD-10-CM | POA: Diagnosis present

## 2023-01-24 DIAGNOSIS — E669 Obesity, unspecified: Secondary | ICD-10-CM | POA: Diagnosis present

## 2023-01-24 DIAGNOSIS — E785 Hyperlipidemia, unspecified: Secondary | ICD-10-CM | POA: Diagnosis present

## 2023-01-24 HISTORY — DX: Presence of other vascular implants and grafts: Z95.828

## 2023-01-24 LAB — CBC WITH DIFFERENTIAL (CANCER CENTER ONLY)
Abs Immature Granulocytes: 0.11 10*3/uL — ABNORMAL HIGH (ref 0.00–0.07)
Basophils Absolute: 0 10*3/uL (ref 0.0–0.1)
Basophils Relative: 2 %
Eosinophils Absolute: 0.1 10*3/uL (ref 0.0–0.5)
Eosinophils Relative: 6 %
HCT: 39.3 % (ref 36.0–46.0)
Hemoglobin: 13.8 g/dL (ref 12.0–15.0)
Immature Granulocytes: 7 %
Lymphocytes Relative: 30 %
Lymphs Abs: 0.5 10*3/uL — ABNORMAL LOW (ref 0.7–4.0)
MCH: 32 pg (ref 26.0–34.0)
MCHC: 35.1 g/dL (ref 30.0–36.0)
MCV: 91.2 fL (ref 80.0–100.0)
Monocytes Absolute: 0.1 10*3/uL (ref 0.1–1.0)
Monocytes Relative: 6 %
Neutro Abs: 0.8 10*3/uL — ABNORMAL LOW (ref 1.7–7.7)
Neutrophils Relative %: 49 %
Platelet Count: 162 10*3/uL (ref 150–400)
RBC: 4.31 MIL/uL (ref 3.87–5.11)
RDW: 14.2 % (ref 11.5–15.5)
Smear Review: NORMAL
WBC Count: 1.6 10*3/uL — ABNORMAL LOW (ref 4.0–10.5)
nRBC: 0 % (ref 0.0–0.2)

## 2023-01-24 LAB — CMP (CANCER CENTER ONLY)
ALT: 17 U/L (ref 0–44)
AST: 15 U/L (ref 15–41)
Albumin: 3.9 g/dL (ref 3.5–5.0)
Alkaline Phosphatase: 64 U/L (ref 38–126)
Anion gap: 8 (ref 5–15)
BUN: 11 mg/dL (ref 8–23)
CO2: 28 mmol/L (ref 22–32)
Calcium: 10 mg/dL (ref 8.9–10.3)
Chloride: 101 mmol/L (ref 98–111)
Creatinine: 1.16 mg/dL — ABNORMAL HIGH (ref 0.44–1.00)
GFR, Estimated: 53 mL/min — ABNORMAL LOW (ref >60)
Glucose, Bld: 102 mg/dL — ABNORMAL HIGH (ref 70–99)
Potassium: 3.8 mmol/L (ref 3.5–5.1)
Sodium: 137 mmol/L (ref 135–145)
Total Bilirubin: 1 mg/dL (ref 0.3–1.2)
Total Protein: 7.3 g/dL (ref 6.5–8.1)

## 2023-01-24 MED ORDER — ONDANSETRON HCL 4 MG/2ML IJ SOLN
8.0000 mg | Freq: Once | INTRAMUSCULAR | Status: AC
  Administered 2023-01-24: 8 mg via INTRAVENOUS
  Filled 2023-01-24: qty 4

## 2023-01-24 MED ORDER — SODIUM CHLORIDE 0.9 % IV SOLN: Freq: Once | INTRAVENOUS | Status: AC

## 2023-01-24 MED ORDER — HEPARIN SOD (PORK) LOCK FLUSH 100 UNIT/ML IV SOLN
500.0000 [IU] | Freq: Once | INTRAVENOUS | Status: AC | PRN
  Administered 2023-01-24: 500 [IU]

## 2023-01-24 MED ORDER — SODIUM CHLORIDE 0.9% FLUSH
10.0000 mL | INTRAVENOUS | Status: DC | PRN
  Administered 2023-01-24: 10 mL

## 2023-01-24 MED ORDER — TRIAMCINOLONE ACETONIDE 0.5 % EX OINT: 1.0000 | TOPICAL_OINTMENT | Freq: Two times a day (BID) | CUTANEOUS | 0 refills | Status: DC

## 2023-01-24 NOTE — Progress Notes (Signed)
I was asked to examine Katie Jacobson due to a complaint of vaginal irritation.  She notes that on her upper inner thighs she has developed increased irritation and itching.  She notes this happened previously in 2016.  She does not shave the area.  She notes that there is also irritation in her gluteal fold.  O- I examined her labia minora and majora there is no vaginal discharge skin is intact there are no ulcerations.  In her upper inner thighs bilaterally she has a raised slightly maculopapular rash.  A: Skin rash, unspecified  P: This is likely secondary to her skin being more sensitive and her treatment along with the skin rubbing together.  I prescribed triamcinolone ointment for her to apply to the area 1-2 times daily.   Lillard Anes, NP 01/24/23 12:29 PM Medical Oncology and Hematology [facility] [address] [place], [place] [address] Tel. [phone]    Fax. [phone]

## 2023-01-24 NOTE — Progress Notes (Signed)
Verbal orders received from MD for pt to receive 1L NS over 2 hours as well as 8 mg IV Zofran for nausea today.  Orders placed under sign and held, infusion appt scheduled.

## 2023-01-24 NOTE — Patient Instructions (Signed)

## 2023-01-24 NOTE — Assessment & Plan Note (Signed)
11/12/2014: Right breast biopsy T1 cN0 stage Ia triple negative IDC Ki-67 19% 11/12/2014-02/03/2015: TC x 4 neoadjuvant chemotherapy, genetics negative 03/02/2015: Right lumpectomy: Grade 1 IDC negative margins triple negative August 2016: Completed radiation   November 2023: Asymmetry left breast and a possible mass (0.8 cm) and asymmetry in the right breast (1.7 cm and 1 cm) Right breast biopsy: 12:00: Grade 2 IDC ER 2%, PR 0%, Ki-67 50%, HER2 negative 2+ by IHC, 1230: Grade 2 IDC   left breast biopsy 1:00: Grade 2 IDC, ER 0%, PR 0%, Ki-67 95%, HER2 2+ , FISH: Negative   CT CAP and bone scan: 10/24/2022: No evidence of metastatic disease.   Treatment plan: 12/15/2022:Left lumpectomy: Grade 3 IDC 2.2 cm, margins negative, ER 0%, PR 0%, HER2 negative, Ki-67 95%, 0/2 lymph nodes negative Right mastectomy: Grade 3 IDC 6.6 cm with DCIS, margins negative, ER 2%, PR 0%, HER2 negative, Ki-67 50% Adjuvant chemotherapy: With Taxotere and Cytoxan every 3 weeks x 4 ----------------------------------------------------------------------------------------------------------------------------------- Current treatment: Taxotere Cytoxan cycle 1 day 8 Chemo toxicities:  Return to clinic in 2 weeks for cycle 2

## 2023-01-26 ENCOUNTER — Encounter (HOSPITAL_BASED_OUTPATIENT_CLINIC_OR_DEPARTMENT_OTHER): Payer: Self-pay | Admitting: Radiology

## 2023-01-26 ENCOUNTER — Other Ambulatory Visit: Payer: Self-pay

## 2023-01-26 ENCOUNTER — Emergency Department (HOSPITAL_BASED_OUTPATIENT_CLINIC_OR_DEPARTMENT_OTHER): Payer: 59

## 2023-01-26 ENCOUNTER — Inpatient Hospital Stay (HOSPITAL_BASED_OUTPATIENT_CLINIC_OR_DEPARTMENT_OTHER)
Admission: EM | Admit: 2023-01-26 | Discharge: 2023-01-29 | DRG: 392 | Disposition: A | Discharge status: Home / Self Care | Payer: 59 | Attending: Internal Medicine | Admitting: Internal Medicine

## 2023-01-26 ENCOUNTER — Encounter: Payer: Self-pay | Admitting: *Deleted

## 2023-01-26 DIAGNOSIS — Z95828 Presence of other vascular implants and grafts: Secondary | ICD-10-CM

## 2023-01-26 DIAGNOSIS — E876 Hypokalemia: Secondary | ICD-10-CM | POA: Diagnosis not present

## 2023-01-26 DIAGNOSIS — N179 Acute kidney failure, unspecified: Secondary | ICD-10-CM | POA: Diagnosis not present

## 2023-01-26 DIAGNOSIS — R112 Nausea with vomiting, unspecified: Secondary | ICD-10-CM | POA: Diagnosis present

## 2023-01-26 DIAGNOSIS — C50411 Malignant neoplasm of upper-outer quadrant of right female breast: Secondary | ICD-10-CM

## 2023-01-26 DIAGNOSIS — Z171 Estrogen receptor negative status [ER-]: Secondary | ICD-10-CM | POA: Diagnosis not present

## 2023-01-26 DIAGNOSIS — I10 Essential (primary) hypertension: Secondary | ICD-10-CM | POA: Diagnosis not present

## 2023-01-26 LAB — CBC WITH DIFFERENTIAL/PLATELET
Abs Immature Granulocytes: 0.18 10*3/uL — ABNORMAL HIGH (ref 0.00–0.07)
Basophils Absolute: 0 10*3/uL (ref 0.0–0.1)
Basophils Relative: 1 %
Eosinophils Absolute: 0 10*3/uL (ref 0.0–0.5)
Eosinophils Relative: 1 %
HCT: 37.6 % (ref 36.0–46.0)
Hemoglobin: 13 g/dL (ref 12.0–15.0)
Immature Granulocytes: 3 %
Lymphocytes Relative: 21 %
Lymphs Abs: 1.1 10*3/uL (ref 0.7–4.0)
MCH: 31.3 pg (ref 26.0–34.0)
MCHC: 34.6 g/dL (ref 30.0–36.0)
MCV: 90.4 fL (ref 80.0–100.0)
Monocytes Absolute: 1.2 10*3/uL — ABNORMAL HIGH (ref 0.1–1.0)
Monocytes Relative: 22 %
Neutro Abs: 2.8 10*3/uL (ref 1.7–7.7)
Neutrophils Relative %: 52 %
Platelets: 216 10*3/uL (ref 150–400)
RBC: 4.16 MIL/uL (ref 3.87–5.11)
RDW: 14 % (ref 11.5–15.5)
WBC: 5.6 10*3/uL (ref 4.0–10.5)
nRBC: 0.5 % — ABNORMAL HIGH (ref 0.0–0.2)

## 2023-01-26 LAB — URINALYSIS, ROUTINE W REFLEX MICROSCOPIC
Glucose, UA: NEGATIVE mg/dL
Ketones, ur: 15 mg/dL — AB
Leukocytes,Ua: NEGATIVE
Nitrite: NEGATIVE
Protein, ur: NEGATIVE mg/dL
Specific Gravity, Urine: 1.01 (ref 1.005–1.030)
pH: 5.5 (ref 5.0–8.0)

## 2023-01-26 LAB — COMPREHENSIVE METABOLIC PANEL
ALT: 17 U/L (ref 0–44)
AST: 14 U/L — ABNORMAL LOW (ref 15–41)
Albumin: 3.7 g/dL (ref 3.5–5.0)
Alkaline Phosphatase: 67 U/L (ref 38–126)
Anion gap: 13 (ref 5–15)
BUN: 11 mg/dL (ref 8–23)
CO2: 25 mmol/L (ref 22–32)
Calcium: 9.6 mg/dL (ref 8.9–10.3)
Chloride: 99 mmol/L (ref 98–111)
Creatinine, Ser: 1.4 mg/dL — ABNORMAL HIGH (ref 0.44–1.00)
GFR, Estimated: 42 mL/min — ABNORMAL LOW (ref >60)
Glucose, Bld: 107 mg/dL — ABNORMAL HIGH (ref 70–99)
Potassium: 3.2 mmol/L — ABNORMAL LOW (ref 3.5–5.1)
Sodium: 137 mmol/L (ref 135–145)
Total Bilirubin: 0.7 mg/dL (ref 0.3–1.2)
Total Protein: 7 g/dL (ref 6.5–8.1)

## 2023-01-26 LAB — URINALYSIS, MICROSCOPIC (REFLEX)

## 2023-01-26 LAB — LIPASE, BLOOD: Lipase: 25 U/L (ref 11–51)

## 2023-01-26 MED ORDER — LORAZEPAM 2 MG/ML IJ SOLN
1.0000 mg | Freq: Once | INTRAMUSCULAR | Status: AC
  Administered 2023-01-26: 1 mg via INTRAVENOUS
  Filled 2023-01-26: qty 1

## 2023-01-26 MED ORDER — LACTATED RINGERS IV SOLN: INTRAVENOUS | Status: DC

## 2023-01-26 MED ORDER — POTASSIUM CHLORIDE 10 MEQ/100ML IV SOLN
10.0000 meq | INTRAVENOUS | Status: AC
  Administered 2023-01-26 (×2): 10 meq via INTRAVENOUS
  Filled 2023-01-26 (×2): qty 100

## 2023-01-26 MED ORDER — PROMETHAZINE HCL 25 MG RE SUPP: 25.0000 mg | Freq: Four times a day (QID) | RECTAL | 0 refills | Status: DC | PRN

## 2023-01-26 MED ORDER — SODIUM CHLORIDE 0.9 % IV BOLUS
1000.0000 mL | Freq: Once | INTRAVENOUS | Status: AC
  Administered 2023-01-26: 1000 mL via INTRAVENOUS

## 2023-01-26 MED ORDER — ONDANSETRON HCL 4 MG/2ML IJ SOLN
4.0000 mg | Freq: Once | INTRAMUSCULAR | Status: AC
  Administered 2023-01-26: 4 mg via INTRAVENOUS
  Filled 2023-01-26: qty 2

## 2023-01-26 MED ORDER — SODIUM CHLORIDE 0.9 % IV SOLN
12.5000 mg | Freq: Four times a day (QID) | INTRAVENOUS | Status: DC | PRN
  Administered 2023-01-26 – 2023-01-27 (×2): 12.5 mg via INTRAVENOUS
  Filled 2023-01-26 (×2): qty 12.5

## 2023-01-26 MED ORDER — IOHEXOL 300 MG/ML  SOLN
100.0000 mL | Freq: Once | INTRAMUSCULAR | Status: AC | PRN
  Administered 2023-01-26: 80 mL via INTRAVENOUS

## 2023-01-26 MED ORDER — PROMETHAZINE HCL 25 MG/ML IJ SOLN
INTRAMUSCULAR | Status: AC
  Filled 2023-01-26: qty 1

## 2023-01-26 MED ORDER — ONDANSETRON HCL 4 MG/2ML IJ SOLN: 4.0000 mg | Freq: Four times a day (QID) | INTRAMUSCULAR | Status: DC | PRN

## 2023-01-26 MED ORDER — SODIUM CHLORIDE 0.9 % IV SOLN
6.2500 mg | Freq: Four times a day (QID) | INTRAVENOUS | Status: DC | PRN
  Administered 2023-01-26: 6.25 mg via INTRAVENOUS
  Filled 2023-01-26: qty 0.25

## 2023-01-26 MED ORDER — METOPROLOL SUCCINATE ER 50 MG PO TB24
100.0000 mg | ORAL_TABLET | Freq: Every day | ORAL | Status: DC
  Administered 2023-01-26 – 2023-01-28 (×3): 100 mg via ORAL
  Filled 2023-01-26 (×3): qty 2

## 2023-01-26 MED ORDER — ONDANSETRON 4 MG PO TBDP: 4.0000 mg | ORAL_TABLET | Freq: Three times a day (TID) | ORAL | 0 refills | Status: DC | PRN

## 2023-01-26 MED ORDER — ENOXAPARIN SODIUM 40 MG/0.4ML IJ SOSY
40.0000 mg | PREFILLED_SYRINGE | INTRAMUSCULAR | Status: DC
  Administered 2023-01-26 – 2023-01-27 (×2): 40 mg via SUBCUTANEOUS
  Filled 2023-01-26 (×2): qty 0.4

## 2023-01-26 NOTE — ED Notes (Signed)
Called [facility] for Transport to [facility] @5 :10pm

## 2023-01-26 NOTE — Assessment & Plan Note (Signed)
-  replete with IV potassium 10meq x 2 

## 2023-01-26 NOTE — Discharge Instructions (Signed)
Please follow-up with your oncologist for this nausea and vomiting.  This may be a side effect of the chemotherapy medications.

## 2023-01-26 NOTE — H&P (Signed)
History and Physical    Patient: Katie Jacobson WUJ:[id number] DOB: August 16, 1956 DOA: 01/26/2023 DOS: the patient was seen and examined on 01/26/2023 PCP: [name], NP  Patient coming from:  [facility] ED  Chief Complaint:  Chief Complaint  Patient presents with   Abdominal Pain   HPI: DNYA HICKLE is a 67 y.o. female with medical history significant of hx of breast cancer with recurrence s/p right mastectomy, left lumpectomy on chemotherapy, HTN, HLD, asthma who presents with persistent nausea and vomiting.   Reports that since starting chemotherapy last week she has persistent nausea and vomiting. Each time she vomits also having diarrhea. Denies abdominal pain. Unable to tolerate po. Feels dehydrated and weak. No fever.   Pt last saw oncologist Dr. Pamelia Hoit two days ago (4/16) with profound nausea and vomiting  thought related to chemo toxicity. She was treated with IV fluids and Zofran. Has been trying to take Zofran several times daily but continues to feel terrible.   In the ED, she was afebrile, normotensive on room air.   No leukocytosis. ANC has improved from 0.8 to 2.8.   Mild hypokalemia. Has AKI with creatinine up to 1.4. Upward trending anion gp.   CT Abd/pelvis is unremarkable.     Review of Systems: As mentioned in the history of present illness. All other systems reviewed and are negative. Past Medical History:  Diagnosis Date   Allergy    Arthritis    Breast cancer of upper-outer quadrant of right female breast 11/14/2014   Treated with chemotherapy and radiation   Heart murmur    History of chemotherapy    finished chemo 02/03/2015   History of kidney stones    History of seizures    as a child - unknown cause - states was never on anticonvulsants   Hypertension    states under control with meds., has been on med. x "years"   Mitral valve prolapse 1990   Echo 2023 with no MVP. Mild-mod aortic regurg and mild mitral regurg. 3 yr followup  recommended.   Osteoporosis    Personal history of chemotherapy    Personal history of radiation therapy    Seasonal allergies    Past Surgical History:  Procedure Laterality Date   ABDOMINAL HYSTERECTOMY  1998   partial   APPENDECTOMY  1976   BREAST BIOPSY Right 09/30/2022   [place] RT BREAST BX W LOC DEV 1ST LESION IMG BX SPEC US GUIDE 09/30/2022 [facility]   BREAST BIOPSY Left 09/30/2022   [place] LT BREAST BX W LOC DEV 1ST LESION IMG BX SPEC US GUIDE 09/30/2022 [facility]   BREAST BIOPSY Right 09/30/2022   [place] RT BREAST BX W LOC DEV EA ADD LESION IMG BX SPEC US GUIDE 09/30/2022 [facility]   BREAST BIOPSY  12/14/2022   MM LT RADIOACTIVE SEED LOC MAMMO GUIDE 12/14/2022 [facility]   BREAST LUMPECTOMY Right 02/2015   BREAST LUMPECTOMY WITH RADIOACTIVE SEED AND SENTINEL LYMPH NODE BIOPSY Left 12/15/2022   Procedure: LEFT BREAST LUMPECTOMY WITH RADIOACTIVE SEED AND AXILLARY SENTINEL LYMPH NODE BIOPSY;  Surgeon: Emelia Loron, MD;  Location: [facility];  Service: General;  Laterality: Left;   COLONOSCOPY  09/30/2016   KNEE ARTHROSCOPY Left 03/26/2008   MASTECTOMY W/ SENTINEL NODE BIOPSY Right 12/15/2022   Procedure: RIGHT MASTECTOMY WITH RIGHT AXILLARY SENTINEL LYMPH NODE BIOPSY;  Surgeon: Emelia Loron, MD;  Location: [facility] OR;  Service: General;  Laterality: Right;  180 MIN ROOM 9   PLANTAR'S WART EXCISION  Left    x 2   PORT-A-CATH REMOVAL Left 04/06/2015   Procedure: REMOVAL PORT-A-CATH;  Surgeon: Emelia Loron, MD;  Location: [facility];  Service: General;  Laterality: Left;   PORTACATH PLACEMENT N/A 11/27/2014   Procedure: INSERTION PORT-A-CATH;  Surgeon: Emelia Loron, MD;  Location: [facility];  Service: General;  Laterality: N/A;   PORTACATH PLACEMENT Right 12/15/2022   Procedure: INSERTION PORT-A-CATH;  Surgeon: Emelia Loron, MD;  Location: [facility] OR;  Service: General;  Laterality: Right;   RADIOACTIVE SEED GUIDED PARTIAL  MASTECTOMY WITH AXILLARY SENTINEL LYMPH NODE BIOPSY Right 03/02/2015   Procedure: RADIOACTIVE SEED GUIDED RIGHT PARTIAL MASTECTOMY WITH RIGHT AXILLARY SENTINEL LYMPH NODE BIOPSY;  Surgeon: Emelia Loron, MD;  Location:  SURGERY CENTER;  Service: General;  Laterality: Right;   SALIVARY STONE REMOVAL Right 1972   TONSILLECTOMY  1970s   TRANSTHORACIC ECHOCARDIOGRAM  01/16/2020   EF 60 to 65%.  GRII DD.  No R WMA.  Normal RV size and function.  Rheumatic mitral valve with moderate thickening-hockey-stick appearing.  Moderate MR with mild MS.  Moderate LA dilation.  (Recommend follow-up in 2 to 3 years)   TRANSTHORACIC ECHOCARDIOGRAM  05/31/2022   Normal LV size and function-EF 60 to 65%.  No RWMA. ??  Normal diastolic parameters but elevated LAP?  Normal RV with normal RVP and RAP.  Manage mitral valve with mild MR and no MS.  Mild to moderate AI with aortic sclerosis but no stenosis.   TUBAL LIGATION  1996   Social History:  reports that she has been smoking cigarettes. She has a 21.50 pack-year smoking history. She quit smokeless tobacco use about 6 years ago. She reports current alcohol use of about 5.0 standard drinks of alcohol per week. She reports that she does not use drugs.  Allergies  Allergen Reactions   Sulfa Antibiotics Other (See Comments)    UNKNOWN    Family History  Problem Relation Age of Onset   Heart disease Mother    Stroke Mother    Hypertension Mother    Diabetes Mother    Diabetes Sister    Kidney disease Brother    Mental retardation Brother    Diabetes Sister    Heart attack Father    Breast cancer Cousin        deceased 77   Colon polyps Daughter    Colon cancer Neg Hx    Esophageal cancer Neg Hx    Rectal cancer Neg Hx    Stomach cancer Neg Hx     Prior to Admission medications   Medication Sig Start Date End Date Taking? Authorizing Provider  ondansetron (ZOFRAN-ODT) 4 MG disintegrating tablet Take 1 tablet (4 mg total) by mouth every 8  (eight) hours as needed for up to 15 doses for nausea or vomiting. 01/26/23  Yes Trifan, Kermit Balo, MD  promethazine (PHENERGAN) 25 MG suppository Place 1 suppository (25 mg total) rectally every 6 (six) hours as needed for up to 12 days for nausea or vomiting. 01/26/23 02/07/23 Yes Trifan, Kermit Balo, MD  albuterol (VENTOLIN HFA) 108 (90 Base) MCG/ACT inhaler Inhale 1-2 puffs into the lungs every 6 (six) hours as needed for wheezing or shortness of breath. 05/23/22   [name], NP  aspirin 81 MG tablet Take 1 tablet (81 mg total) by mouth daily. 04/21/15   Magrinat, Valentino Hue, MD  chlorthalidone (HYGROTON) 25 MG tablet TAKE 1 TABLET(25 MG) BY MOUTH EVERY OTHER DAY 11/01/22   Peggyann Juba,  Melissa, NP  enalapril (VASOTEC) 20 MG tablet TAKE 1 TABLET(20 MG) BY MOUTH AT BEDTIME 11/01/22   [name], NP  fluticasone (FLONASE) 50 MCG/ACT nasal spray Place 2 sprays into both nostrils as needed. 05/23/22   [name], NP  lidocaine-prilocaine (EMLA) cream Apply to affected area once 12/28/22   [name], MD  metoprolol succinate (TOPROL-XL) 100 MG 24 hr tablet Take 1 tablet (100 mg total) by mouth daily. Take with or immediately following a meal. Patient taking differently: Take 100 mg by mouth at bedtime. Take with or immediately following a meal. 11/01/22   [name], NP  ondansetron (ZOFRAN) 8 MG tablet Take 1 tablet (8 mg total) by mouth every 8 (eight) hours as needed for nausea or vomiting. Start on the third day after chemotherapy. 12/28/22   [name], MD  prochlorperazine (COMPAZINE) 10 MG tablet Take 1 tablet (10 mg total) by mouth every 6 (six) hours as needed for nausea or vomiting. 12/28/22   [name], MD  triamcinolone ointment (KENALOG) 0.5 % Apply 1 Application topically 2 (two) times daily. 01/24/23   Loa Socks, NP    Physical Exam: Vitals:   01/26/23 1700 01/26/23 1715 01/26/23 1730 01/26/23 1825  BP: 139/73   (!) 142/71  Pulse: 77 79 82  80  Resp:    18  Temp:      TempSrc:      SpO2: 96% 97% 98% 99%   Constitutional: NAD, calm, comfortable, nontoxic but mildly ill-appearing female sitting upright in bed Eyes: lids and conjunctivae normal ENMT: Mucous membranes are dry. Neck: normal, supple Respiratory: clear to auscultation bilaterally, no wheezing, no crackles. Normal respiratory effort. No accessory muscle use.  Cardiovascular: Regular rate and rhythm, no murmurs / rubs / gallops. No extremity edema.  Abdomen: soft, nondistended, no tenderness, Bowel sounds positive.  Musculoskeletal: no clubbing / cyanosis. No joint deformity upper and lower extremities. Normal muscle tone.  Skin: no rashes, lesions, ulcers. No induration Neurologic: CN 2-12 grossly intact. Strength 5/5 in all 4.  Psychiatric: Normal judgment and insight. Alert and oriented x 3. Normal mood. Data Reviewed:  See HPI  Assessment and Plan: * Intractable nausea and vomiting -secondary to chemotherapy toxicity -No other signs of infection. ANC is recovering. CT imaging is negative.  -PRN IV Zofran and IV phenergan  -continuous IV fluid -full liquid diet and can advance as tolerated  Hypokalemia -replete with IV potassium x2  AKI (acute kidney injury) -creatinine of 1.40 from baseline of around 0.8-0.9 -keep on continuous IV fluids and follow -hold home thiazide and ACE-I -avoid nephrotoxic agent   Malignant neoplasm of upper-outer quadrant of right breast in female, estrogen receptor negative -Follows with oncologist Dr. Pamelia Hoit -had breast cancer in 2016 now with recurrence -on Taxotere Cytoxan cycle 1 with toxicity of nausea and vomiting -will need to let oncology know she is hospitalized tomorrow  Essential hypertension -hold home diuretic and ACE-I due to AKI -continue metoprolol      Advance Care Planning: Full  Consults: need oncology consult in the morning  Family Communication: husband and daughter at  bedside  Severity of Illness: The appropriate patient status for this patient is OBSERVATION. Observation status is judged to be reasonable and necessary in order to provide the required intensity of service to ensure the patient's safety. The patient's presenting symptoms, physical exam findings, and initial radiographic and laboratory data in the context of their medical condition is felt to place them at decreased risk  for further clinical deterioration. Furthermore, it is anticipated that the patient will be medically stable for discharge from the hospital within 2 midnights of admission.   Author: Anselm Jungling, DO 01/26/2023 8:18 PM  For on call review [email or web address].

## 2023-01-26 NOTE — Assessment & Plan Note (Addendum)
-  Follows with oncologist Dr. Pamelia Hoit -had breast cancer in 2016 now with recurrence -on Taxotere Cytoxan cycle 1 with toxicity of nausea and vomiting -will need to let oncology know she is hospitalized tomorrow

## 2023-01-26 NOTE — Plan of Care (Signed)
Patient is a 67 year old female with history of triple negative breast cancer status post right-sided mastectomy currently on chemotherapy, hypertension who presented to med [facility] today with intractable nausea ,vomiting and abdominal pain.  she is currently on new chemotherapy Taxotere Cytoxan cycle 1.  Patient has been nauseous and vomiting since last several days.She was seen by oncology on 4/16 and was given IV fluids, Zofran.  She also complains of generalized fatigue, weakness, decreased oral intake.  On presentation, she was hemodynamically stable.  Lab work showed potassium of 3.2, creatinine of 1.4.  CT abdomen/pelvis did not show any acute findings.  Patient being admitted for the management of intractable nausea and vomiting secondary to chemotherapy.

## 2023-01-26 NOTE — Assessment & Plan Note (Signed)
-  hold home diuretic and ACE-I due to AKI -continue metoprolol

## 2023-01-26 NOTE — Assessment & Plan Note (Signed)
-  secondary to chemotherapy toxicity -No other signs of infection. ANC is recovering. CT imaging is negative.  -PRN IV Zofran and IV phenergan  -continuous IV fluid -full liquid diet and can advance as tolerated

## 2023-01-26 NOTE — ED Provider Notes (Signed)
Blood pressure (!) 138/110, pulse 72, temperature 97.9 F (36.6 C), temperature source Oral, resp. rate 16, last menstrual period 10/10/1996, SpO2 99 %.  Assuming care from Dr. Renaye Rakers.  In short, Katie Jacobson is a 67 y.o. female with a chief complaint of Abdominal Pain .  Refer to the original H&P for additional details.  The current plan of care is to follow up after PO challenge.   Patient not tolerating PO well. Plan for admit.   Discussed patient's case with [facility] to request admission. Patient and family (if present) updated with plan.  I reviewed all nursing notes, vitals, pertinent old records, EKGs, labs, imaging (as available).     Maia Plan, MD 01/26/23 [phone]

## 2023-01-26 NOTE — Assessment & Plan Note (Signed)
-  creatinine of 1.40 from baseline of around 0.8-0.9 -keep on continuous IV fluids and follow -hold home thiazide and ACE-I -avoid nephrotoxic agent

## 2023-01-26 NOTE — ED Triage Notes (Signed)
Patient presents to ED via POV from home. Here with abdominal pain, nausea, vomiting and diarrhea. Breast cancer patient. Patient hysterical in triage, crying out to God to help her. Hyperventilating.

## 2023-01-26 NOTE — ED Provider Notes (Signed)
[place] [facility] AT [facility] Provider Note   CSN: [id number] Arrival date & time: 01/26/23  4098     History  Chief Complaint  Patient presents with   Abdominal Pain    Katie Jacobson is a 67 y.o. female with a history of breast cancer status post right-sided mastectomy, port placement, left-sided lumpectomy and sentinel node biopsy, presenting to the ED with nausea and vomiting and fatigue.  Patient is a poor historian on arrival, she feels extremely unwell, and keeps repeating "I am sick, I am sick, I am sick".  Her husband at the bedside clarifies that the patient has had complaints of nausea, vomiting, fatigue and diarrhea, ongoing for several days.  There was seen in the oncology office 2 days ago, at that time noted to have profound nausea and vomiting felt to be secondary to "chemotoxicity" on Taxotere Cytoxan cycle 1, with a plan to reduce the dosage of her next cycle of chemotherapy.  She reports that she never got any better since going home and continues to feel extremely nauseated.  She denies specifically pain unless she is "dry heaving".  HPI     Home Medications Prior to Admission medications   Medication Sig Start Date End Date Taking? Authorizing Provider  ondansetron (ZOFRAN-ODT) 4 MG disintegrating tablet Take 1 tablet (4 mg total) by mouth every 8 (eight) hours as needed for up to 15 doses for nausea or vomiting. 01/26/23  Yes Denard Tuminello, Kermit Balo, MD  promethazine (PHENERGAN) 25 MG suppository Place 1 suppository (25 mg total) rectally every 6 (six) hours as needed for up to 12 days for nausea or vomiting. 01/26/23 02/07/23 Yes Eduarda Scrivens, Kermit Balo, MD  albuterol (VENTOLIN HFA) 108 (90 Base) MCG/ACT inhaler Inhale 1-2 puffs into the lungs every 6 (six) hours as needed for wheezing or shortness of breath. 05/23/22   [name], NP  aspirin 81 MG tablet Take 1 tablet (81 mg total) by mouth daily. 04/21/15   Magrinat, Valentino Hue, MD   chlorthalidone (HYGROTON) 25 MG tablet TAKE 1 TABLET(25 MG) BY MOUTH EVERY OTHER DAY 11/01/22   [name], NP  enalapril (VASOTEC) 20 MG tablet TAKE 1 TABLET(20 MG) BY MOUTH AT BEDTIME 11/01/22   [name], NP  fluticasone (FLONASE) 50 MCG/ACT nasal spray Place 2 sprays into both nostrils as needed. 05/23/22   [name], NP  lidocaine-prilocaine (EMLA) cream Apply to affected area once 12/28/22   [name], MD  metoprolol succinate (TOPROL-XL) 100 MG 24 hr tablet Take 1 tablet (100 mg total) by mouth daily. Take with or immediately following a meal. Patient taking differently: Take 100 mg by mouth at bedtime. Take with or immediately following a meal. 11/01/22   [name], NP  ondansetron (ZOFRAN) 8 MG tablet Take 1 tablet (8 mg total) by mouth every 8 (eight) hours as needed for nausea or vomiting. Start on the third day after chemotherapy. 12/28/22   [name], MD  prochlorperazine (COMPAZINE) 10 MG tablet Take 1 tablet (10 mg total) by mouth every 6 (six) hours as needed for nausea or vomiting. 12/28/22   [name], MD  triamcinolone ointment (KENALOG) 0.5 % Apply 1 Application topically 2 (two) times daily. 01/24/23   Loa Socks, NP      Allergies    Sulfa antibiotics    Review of Systems   Review of Systems  Physical Exam Updated Vital Signs BP (!) 138/110   Pulse 72   Temp 97.9 F (36.6  C) (Oral)   Resp 16   LMP 10/10/1996   SpO2 99%  Physical Exam Constitutional:      General: She is not in acute distress.    Comments: Breathing quickly, repeating "Im sick" Dry heaving in the room  HENT:     Head: Normocephalic and atraumatic.  Eyes:     Conjunctiva/sclera: Conjunctivae normal.     Pupils: Pupils are equal, round, and reactive to light.  Cardiovascular:     Rate and Rhythm: Normal rate and regular rhythm.  Pulmonary:     Effort: Pulmonary effort is normal. No respiratory distress.  Abdominal:     General:  There is no distension.     Tenderness: There is generalized abdominal tenderness.  Skin:    General: Skin is warm and dry.  Neurological:     General: No focal deficit present.     Mental Status: She is alert. Mental status is at baseline.  Psychiatric:        Mood and Affect: Mood normal.        Behavior: Behavior normal.     ED Results / Procedures / Treatments   Labs (all labs ordered are listed, but only abnormal results are displayed) Labs Reviewed  COMPREHENSIVE METABOLIC PANEL - Abnormal; Notable for the following components:      Result Value   Potassium 3.2 (*)    Glucose, Bld 107 (*)    Creatinine, Ser 1.40 (*)    AST 14 (*)    GFR, Estimated 42 (*)    All other components within normal limits  CBC WITH DIFFERENTIAL/PLATELET - Abnormal; Notable for the following components:   nRBC 0.5 (*)    Monocytes Absolute 1.2 (*)    Abs Immature Granulocytes 0.18 (*)    All other components within normal limits  URINALYSIS, ROUTINE W REFLEX MICROSCOPIC - Abnormal; Notable for the following components:   Hgb urine dipstick SMALL (*)    Bilirubin Urine SMALL (*)    Ketones, ur 15 (*)    All other components within normal limits  URINALYSIS, MICROSCOPIC (REFLEX) - Abnormal; Notable for the following components:   Bacteria, UA RARE (*)    All other components within normal limits  LIPASE, BLOOD    EKG None  Radiology CT ABDOMEN PELVIS W CONTRAST  Result Date: 01/26/2023 CLINICAL DATA:  Nausea, vomiting.  History of breast cancer. EXAM: CT ABDOMEN AND PELVIS WITH CONTRAST TECHNIQUE: Multidetector CT imaging of the abdomen and pelvis was performed using the standard protocol following bolus administration of intravenous contrast. RADIATION DOSE REDUCTION: This exam was performed according to the departmental dose-optimization program which includes automated exposure control, adjustment of the mA and/or kV according to patient size and/or use of iterative reconstruction  technique. CONTRAST:  80mL OMNIPAQUE IOHEXOL 300 MG/ML  SOLN COMPARISON:  October 24, 2022. FINDINGS: Lower chest: No acute abnormality. Hepatobiliary: No focal liver abnormality is seen. No gallstones, gallbladder wall thickening, or biliary dilatation. Pancreas: Unremarkable. No pancreatic ductal dilatation or surrounding inflammatory changes. Spleen: Normal in size without focal abnormality. Adrenals/Urinary Tract: Adrenal glands are unremarkable. Kidneys are normal, without renal calculi, focal lesion, or hydronephrosis. Bladder is unremarkable. Stomach/Bowel: Stomach is within normal limits. Status post appendectomy. No evidence of bowel wall thickening, distention, or inflammatory changes. Vascular/Lymphatic: Aortic atherosclerosis. No enlarged abdominal or pelvic lymph nodes. Reproductive: Status post hysterectomy. No adnexal masses. Other: No abdominal wall hernia or abnormality. No abdominopelvic ascites. Musculoskeletal: No acute or significant osseous findings. IMPRESSION: No acute abnormality  seen in the abdomen or pelvis. Aortic Atherosclerosis (ICD10-I70.0). Electronically Signed   By: Lupita Raider M.D.   On: 01/26/2023 13:30    Procedures Procedures    Medications Ordered in ED Medications  promethazine (PHENERGAN) 6.25 mg in sodium chloride 0.9 % 50 mL IVPB (6.25 mg Intravenous New Bag/Given 01/26/23 1438)  promethazine (PHENERGAN) 25 MG/ML injection (25 mg  Not Given 01/26/23 1434)  LORazepam (ATIVAN) injection 1 mg (1 mg Intravenous Given 01/26/23 1038)  sodium chloride 0.9 % bolus 1,000 mL (0 mLs Intravenous Stopped 01/26/23 1432)  ondansetron (ZOFRAN) injection 4 mg (4 mg Intravenous Given 01/26/23 1038)  iohexol (OMNIPAQUE) 300 MG/ML solution 100 mL (80 mLs Intravenous Contrast Given 01/26/23 1218)    ED Course/ Medical Decision Making/ A&P Clinical Course as of 01/26/23 1542  Thu Jan 26, 2023  1155 Patient reassessed and is feeling much more comfortable after her medications,  continues to feel somewhat nauseated.  We discussed CT imaging at this point, which I would still recommend to evaluate for alternative causes of her persistent nausea and vomiting, and they are in agreement. [MT]  1418 Patient reassessed and is feeling much better.  No emergent findings on CT imaging.  At this point she feels that she does need some additional antiemetic and I will give her Phenergan, she has already been able to keep down Sprite which her husband brought her at the bedside.  She was also trying to eat [employer] fries and chicken nuggets, had 1 of each, and I advised that she stop trying to eat these foods as they are greasy and having could upset her stomach.  We can try saltines instead. [MT]    Clinical Course User Index [MT] Akayla Brass, Kermit Balo, MD                             Medical Decision Making Amount and/or Complexity of Data Reviewed Labs: ordered. Radiology: ordered.  Risk Prescription drug management.   This patient presents to the ED with concern for generalized weakness, nausea vomiting and diarrhea. This involves an extensive number of treatment options, and is a complaint that carries with it a high risk of complications and morbidity.  The differential diagnosis includes chemotherapy side effect versus viral gastroenteritis versus bowel obstruction versus other  Co-morbidities that complicate the patient evaluation: History of malignancy and chemotherapy at high risk of infection and chemotherapy toxicity or side effects  Additional history obtained from the patient's husband at bedside  External records from outside source obtained and reviewed including oncology office evaluation from 4/16  I ordered and personally interpreted labs.  The pertinent results include: Mild hypokalemia, otherwise no emergent findings noted.  Cr 1.4, baseline Cr 1.0   I ordered imaging studies including CT abdomen pelvis I independently visualized and interpreted imaging which  showed no emergent findings I agree with the radiologist interpretation  The patient was maintained on a cardiac monitor.  I personally viewed and interpreted the cardiac monitored which showed an underlying rhythm of: sinus rhythm  I ordered medication including IV fluids, IV Zofran for nausea, IV Ativan for agitation and hyperventilation, which is suspected likely secondary to the patient feeling nauseated and unwell.  I have reviewed the patients home medicines and have made adjustments as needed  Test Considered: Lower suspicion for acute PE in this clinical setting.  No chest pain or hypoxia.  She has some minor hyperventilation related to feeling nauseated,  she is actively dry heaving in the room.  After the interventions noted above, I reevaluated the patient and found that they have: improved          Final Clinical Impression(s) / ED Diagnoses Final diagnoses:  Nausea and vomiting, unspecified vomiting type    Rx / DC Orders ED Discharge Orders          Ordered    promethazine (PHENERGAN) 25 MG suppository  Every 6 hours PRN        01/26/23 1541    ondansetron (ZOFRAN-ODT) 4 MG disintegrating tablet  Every 8 hours PRN        01/26/23 1541              Terald Sleeper, MD 01/26/23 1544

## 2023-01-26 NOTE — ED Notes (Addendum)
Pt asleep and oxygen level decreased to 87% placed pt on 2L nasal cannula

## 2023-01-27 ENCOUNTER — Telehealth: Payer: Self-pay | Admitting: *Deleted

## 2023-01-27 DIAGNOSIS — E785 Hyperlipidemia, unspecified: Secondary | ICD-10-CM | POA: Diagnosis not present

## 2023-01-27 DIAGNOSIS — E860 Dehydration: Secondary | ICD-10-CM | POA: Diagnosis not present

## 2023-01-27 DIAGNOSIS — M810 Age-related osteoporosis without current pathological fracture: Secondary | ICD-10-CM | POA: Diagnosis not present

## 2023-01-27 DIAGNOSIS — Z7982 Long term (current) use of aspirin: Secondary | ICD-10-CM | POA: Diagnosis not present

## 2023-01-27 DIAGNOSIS — E876 Hypokalemia: Secondary | ICD-10-CM | POA: Diagnosis not present

## 2023-01-27 DIAGNOSIS — Z6841 Body Mass Index (BMI) 40.0 and over, adult: Secondary | ICD-10-CM | POA: Diagnosis not present

## 2023-01-27 DIAGNOSIS — I080 Rheumatic disorders of both mitral and aortic valves: Secondary | ICD-10-CM | POA: Diagnosis not present

## 2023-01-27 DIAGNOSIS — D72829 Elevated white blood cell count, unspecified: Secondary | ICD-10-CM | POA: Diagnosis not present

## 2023-01-27 DIAGNOSIS — Z79899 Other long term (current) drug therapy: Secondary | ICD-10-CM | POA: Diagnosis not present

## 2023-01-27 DIAGNOSIS — Z823 Family history of stroke: Secondary | ICD-10-CM | POA: Diagnosis not present

## 2023-01-27 DIAGNOSIS — Z810 Family history of intellectual disabilities: Secondary | ICD-10-CM | POA: Diagnosis not present

## 2023-01-27 DIAGNOSIS — E669 Obesity, unspecified: Secondary | ICD-10-CM | POA: Diagnosis not present

## 2023-01-27 DIAGNOSIS — F17210 Nicotine dependence, cigarettes, uncomplicated: Secondary | ICD-10-CM | POA: Diagnosis not present

## 2023-01-27 DIAGNOSIS — X58XXXA Exposure to other specified factors, initial encounter: Secondary | ICD-10-CM | POA: Diagnosis not present

## 2023-01-27 DIAGNOSIS — Z803 Family history of malignant neoplasm of breast: Secondary | ICD-10-CM | POA: Diagnosis not present

## 2023-01-27 DIAGNOSIS — K521 Toxic gastroenteritis and colitis: Secondary | ICD-10-CM | POA: Diagnosis not present

## 2023-01-27 DIAGNOSIS — Z923 Personal history of irradiation: Secondary | ICD-10-CM | POA: Diagnosis not present

## 2023-01-27 DIAGNOSIS — I10 Essential (primary) hypertension: Secondary | ICD-10-CM | POA: Diagnosis not present

## 2023-01-27 DIAGNOSIS — T451X5A Adverse effect of antineoplastic and immunosuppressive drugs, initial encounter: Secondary | ICD-10-CM | POA: Diagnosis not present

## 2023-01-27 DIAGNOSIS — Z171 Estrogen receptor negative status [ER-]: Secondary | ICD-10-CM | POA: Diagnosis not present

## 2023-01-27 DIAGNOSIS — N179 Acute kidney failure, unspecified: Secondary | ICD-10-CM | POA: Diagnosis not present

## 2023-01-27 DIAGNOSIS — Z8249 Family history of ischemic heart disease and other diseases of the circulatory system: Secondary | ICD-10-CM | POA: Diagnosis not present

## 2023-01-27 DIAGNOSIS — Z833 Family history of diabetes mellitus: Secondary | ICD-10-CM | POA: Diagnosis not present

## 2023-01-27 DIAGNOSIS — C50411 Malignant neoplasm of upper-outer quadrant of right female breast: Secondary | ICD-10-CM | POA: Diagnosis not present

## 2023-01-27 DIAGNOSIS — R112 Nausea with vomiting, unspecified: Secondary | ICD-10-CM | POA: Diagnosis not present

## 2023-01-27 LAB — BASIC METABOLIC PANEL
Anion gap: 9 (ref 5–15)
Anion gap: 9 (ref 5–15)
BUN: 8 mg/dL (ref 8–23)
BUN: 6 mg/dL — ABNORMAL LOW (ref 8–23)
CO2: 23 mmol/L (ref 22–32)
CO2: 24 mmol/L (ref 22–32)
Calcium: 7.9 mg/dL — ABNORMAL LOW (ref 8.9–10.3)
Calcium: 8 mg/dL — ABNORMAL LOW (ref 8.9–10.3)
Chloride: 100 mmol/L (ref 98–111)
Chloride: 101 mmol/L (ref 98–111)
Creatinine, Ser: 1.02 mg/dL — ABNORMAL HIGH (ref 0.44–1.00)
Creatinine, Ser: 0.97 mg/dL (ref 0.44–1.00)
GFR, Estimated: >60 mL/min (ref >60)
GFR, Estimated: >60 mL/min (ref >60)
Glucose, Bld: 111 mg/dL — ABNORMAL HIGH (ref 70–99)
Glucose, Bld: 90 mg/dL (ref 70–99)
Potassium: 2.9 mmol/L — ABNORMAL LOW (ref 3.5–5.1)
Potassium: 3.8 mmol/L (ref 3.5–5.1)
Sodium: 132 mmol/L — ABNORMAL LOW (ref 135–145)
Sodium: 134 mmol/L — ABNORMAL LOW (ref 135–145)

## 2023-01-27 LAB — CBC
HCT: 36.4 % (ref 36.0–46.0)
Hemoglobin: 12.3 g/dL (ref 12.0–15.0)
MCH: 31.5 pg (ref 26.0–34.0)
MCHC: 33.8 g/dL (ref 30.0–36.0)
MCV: 93.3 fL (ref 80.0–100.0)
Platelets: 209 10*3/uL (ref 150–400)
RBC: 3.9 MIL/uL (ref 3.87–5.11)
RDW: 14.6 % (ref 11.5–15.5)
WBC: 10.2 10*3/uL (ref 4.0–10.5)
nRBC: 0.4 % — ABNORMAL HIGH (ref 0.0–0.2)

## 2023-01-27 LAB — MAGNESIUM: Magnesium: 1.9 mg/dL (ref 1.7–2.4)

## 2023-01-27 MED ORDER — SODIUM CHLORIDE 0.9% FLUSH
10.0000 mL | INTRAVENOUS | Status: DC | PRN
  Administered 2023-01-29: 10 mL

## 2023-01-27 MED ORDER — SODIUM CHLORIDE 0.9% FLUSH: 10.0000 mL | Freq: Two times a day (BID) | INTRAVENOUS | Status: DC

## 2023-01-27 MED ORDER — ONDANSETRON HCL 4 MG/2ML IJ SOLN
4.0000 mg | Freq: Three times a day (TID) | INTRAMUSCULAR | Status: DC
  Administered 2023-01-27 – 2023-01-29 (×4): 4 mg via INTRAVENOUS
  Filled 2023-01-27 (×5): qty 2

## 2023-01-27 MED ORDER — POTASSIUM CHLORIDE 10 MEQ/100ML IV SOLN
10.0000 meq | INTRAVENOUS | Status: AC
  Administered 2023-01-27 (×6): 10 meq via INTRAVENOUS
  Filled 2023-01-27 (×2): qty 100

## 2023-01-27 MED ORDER — ALBUTEROL SULFATE (2.5 MG/3ML) 0.083% IN NEBU: 2.5000 mg | INHALATION_SOLUTION | Freq: Four times a day (QID) | RESPIRATORY_TRACT | Status: DC | PRN

## 2023-01-27 MED ORDER — ALBUTEROL SULFATE HFA 108 (90 BASE) MCG/ACT IN AERS: 1.0000 | INHALATION_SPRAY | Freq: Four times a day (QID) | RESPIRATORY_TRACT | Status: DC | PRN

## 2023-01-27 MED ORDER — CHLORHEXIDINE GLUCONATE CLOTH 2 % EX PADS
6.0000 | MEDICATED_PAD | Freq: Every day | CUTANEOUS | Status: DC
  Administered 2023-01-27 – 2023-01-29 (×3): 6 via TOPICAL

## 2023-01-27 NOTE — Progress Notes (Signed)
ONCOLOGY Patient was seen and examined Intractable nausea and vomiting: Will schedule nausea meds round the clock and also PRN Diarrhea: Secondary to chemo Triple Neg breast cancer: S/P TC chemo cycle 1 given on 01/17/23 Fatigue Neutrophil count recovered  Once nausea, vomiting and diarrhea are controlled and patient is able to take oral food adequately, she can be discharged home. Expected time 24-48 hours

## 2023-01-27 NOTE — Progress Notes (Signed)
PROGRESS NOTE    Katie Jacobson  ZOX:[id number] DOB: 04-Sep-1956 DOA: 01/26/2023 PCP: [name], NP   Brief Narrative: 67 year old female with a history of ER negative breast cancer status post right-sided mastectomy on chemotherapy Taxotere Cytoxan cycle 1  Her last chemotherapy was on January 18, 2023 She saw the oncologist on April 16 with nausea vomiting diarrhea given IV fluids and Zofran She continues to do poorly at home with nausea vomiting and diarrhea, she denied fever, shortness of breath or cough On admission her creatinine was 1.4, potassium 3.2, ANC 2.8 no leukocytosis Unable to keep anything down, generalized weakness CT abdomen pelvis no acute findings UA ketones positive Lipase 25  Other past medical history hyperlipidemia hypertension asthma and obesity. Assessment & Plan:   Principal Problem:   Intractable nausea and vomiting Active Problems:   Essential hypertension   Malignant neoplasm of upper-outer quadrant of right breast in female, estrogen receptor negative   AKI (acute kidney injury)   Hypokalemia   #1 intractable nausea and vomiting in the setting of recent chemo-likely related to chemo toxicity. Will schedule Zofran 3 times a day(check EKG to make sure no QT C prolongation) Will consider Ativan if no response to Zofran Continue IV fluids  #2 AKI/dehydration presenting with nausea vomiting diarrhea and ketones in the urine.  Continue IV fluids.  Continue to hold thiazide diuretics and ACE inhibitor which she was taking at home. Creatinine improving  #3 hypokalemia replete check mag level  #4 malignant neoplasm of the right upper outer quadrant breast ER negative patient started her first cycle of chemotherapy January 18, 2023  #5 hypertension on metoprolol ACE and HCTZ on hold  Estimated body mass index is 40.17 kg/m as calculated from the following:   Height as of 01/24/23:  (1.549 m).   Weight as of 01/24/23: 96.4 kg.  DVT  prophylaxis: LOVENOX Code Status: Full code Family Communication: None at bedside Disposition Plan:  Status is: In patient The patient will require care spanning > 2 midnights and should be moved to inpatient because: Intractable nausea vomiting chemo induced   Consultants:  Oncology  Procedures: None  antimicrobials: None Subjective: Resting in bed complains of nausea vomiting and diarrhea  Objective: Vitals:   01/27/23 0142 01/27/23 0555 01/27/23 0938 01/27/23 1200  BP: 123/63 138/71 (!) 141/86 135/78  Pulse: 80 65 73 74  Resp: 14 18    Temp: 98.2 F (36.8 C) 97.8 F (36.6 C)    TempSrc:      SpO2: 99% 100% 100% 100%    Intake/Output Summary (Last 24 hours) at 01/27/2023 1204 Last data filed at 01/27/2023 0600 Gross per 24 hour  Intake 1722.95 ml  Output 200 ml  Net 1522.95 ml   There were no vitals filed for this visit.  Examination:  General exam: Appears calm and comfortable  Respiratory system: Clear to auscultation. Respiratory effort normal. Cardiovascular system: S1 & S2 heard, RRR. No JVD, murmurs, rubs, gallops or clicks. No pedal edema. Gastrointestinal system: Abdomen is nondistended, soft and nontender. No organomegaly or masses felt. Normal bowel sounds heard. Central nervous system: Alert and oriented. No focal neurological deficits. Extremities: Symmetric 5 x 5 power. Skin: No rashes, lesions or ulcers Psychiatry: Judgement and insight appear normal. Mood & affect appropriate.     Data Reviewed: I have personally reviewed following labs and imaging studies  CBC: Recent Labs  Lab 01/24/23 1132 01/26/23 1016 01/27/23 0450  WBC 1.6* 5.6 10.2  NEUTROABS 0.8* 2.8  --  HGB 13.8 13.0 12.3  HCT 39.3 37.6 36.4  MCV 91.2 90.4 93.3  PLT 162 216 209   Basic Metabolic Panel: Recent Labs  Lab 01/24/23 1132 01/26/23 1016 01/27/23 0450  NA 137 137 132*  K 3.8 3.2* 2.9*  CL 101 99 100  CO2 28 25 23   GLUCOSE 102* 107* 111*  BUN 11 11 8    CREATININE 1.16* 1.40* 1.02*  CALCIUM 10.0 9.6 7.9*   GFR: Estimated Creatinine Clearance: 59.1 mL/min (A) (by C-G formula based on SCr of 1.02 mg/dL (H)). Liver Function Tests: Recent Labs  Lab 01/24/23 1132 01/26/23 1016  AST 15 14*  ALT 17 17  ALKPHOS 64 67  BILITOT 1.0 0.7  PROT 7.3 7.0  ALBUMIN 3.9 3.7   Recent Labs  Lab 01/26/23 1016  LIPASE 25   No results for input(s): "AMMONIA" in the last 168 hours. Coagulation Profile: No results for input(s): "INR", "PROTIME" in the last 168 hours. Cardiac Enzymes: No results for input(s): "CKTOTAL", "CKMB", "CKMBINDEX", "TROPONINI" in the last 168 hours. BNP (last 3 results) No results for input(s): "PROBNP" in the last 8760 hours. HbA1C: No results for input(s): "HGBA1C" in the last 72 hours. CBG: No results for input(s): "GLUCAP" in the last 168 hours. Lipid Profile: No results for input(s): "CHOL", "HDL", "LDLCALC", "TRIG", "CHOLHDL", "LDLDIRECT" in the last 72 hours. Thyroid Function Tests: No results for input(s): "TSH", "T4TOTAL", "FREET4", "T3FREE", "THYROIDAB" in the last 72 hours. Anemia Panel: No results for input(s): "VITAMINB12", "FOLATE", "FERRITIN", "TIBC", "IRON", "RETICCTPCT" in the last 72 hours. Sepsis Labs: No results for input(s): "PROCALCITON", "LATICACIDVEN" in the last 168 hours.  No results found for this or any previous visit (from the past 240 hour(s)).       Radiology Studies: CT ABDOMEN PELVIS W CONTRAST  Result Date: 01/26/2023 CLINICAL DATA:  Nausea, vomiting.  History of breast cancer. EXAM: CT ABDOMEN AND PELVIS WITH CONTRAST TECHNIQUE: Multidetector CT imaging of the abdomen and pelvis was performed using the standard protocol following bolus administration of intravenous contrast. RADIATION DOSE REDUCTION: This exam was performed according to the departmental dose-optimization program which includes automated exposure control, adjustment of the mA and/or kV according to patient size  and/or use of iterative reconstruction technique. CONTRAST:  80mL OMNIPAQUE IOHEXOL 300 MG/ML  SOLN COMPARISON:  October 24, 2022. FINDINGS: Lower chest: No acute abnormality. Hepatobiliary: No focal liver abnormality is seen. No gallstones, gallbladder wall thickening, or biliary dilatation. Pancreas: Unremarkable. No pancreatic ductal dilatation or surrounding inflammatory changes. Spleen: Normal in size without focal abnormality. Adrenals/Urinary Tract: Adrenal glands are unremarkable. Kidneys are normal, without renal calculi, focal lesion, or hydronephrosis. Bladder is unremarkable. Stomach/Bowel: Stomach is within normal limits. Status post appendectomy. No evidence of bowel wall thickening, distention, or inflammatory changes. Vascular/Lymphatic: Aortic atherosclerosis. No enlarged abdominal or pelvic lymph nodes. Reproductive: Status post hysterectomy. No adnexal masses. Other: No abdominal wall hernia or abnormality. No abdominopelvic ascites. Musculoskeletal: No acute or significant osseous findings. IMPRESSION: No acute abnormality seen in the abdomen or pelvis. Aortic Atherosclerosis (ICD10-I70.0). Electronically Signed   By: Lupita Raider M.D.   On: 01/26/2023 13:30        Scheduled Meds:  enoxaparin (LOVENOX) injection  40 mg Subcutaneous Q24H   metoprolol succinate  100 mg Oral QHS   Continuous Infusions:  lactated ringers 125 mL/hr at 01/26/23 1649   promethazine (PHENERGAN) injection (IM or IVPB) 12.5 mg (01/27/23 0217)     LOS: 0 days   Time spent: 4  min  Alwyn Ren, MD  01/27/2023, 12:04 PM

## 2023-01-27 NOTE — TOC Progression Note (Signed)
Transition of Care [facility]) - Progression Note    Patient Details  Name: Katie Jacobson MRN: [id number] Date of Birth: 1956-08-06  Transition of Care [facility]) CM/SW Contact  Coralyn Helling, [place] Phone Number: 01/27/2023, 9:11 AM  Clinical Narrative:     Transition of Care (TOC) Screening Note   Patient Details  Name: [name] Date of Birth: 01-10-1956   Transition of Care [facility]) CM/SW Contact:    Coralyn Helling, LCSW Phone Number: 01/27/2023, 9:11 AM    Transition of Care Department [facility]) has reviewed patient and no TOC needs have been identified at this time. We will continue to monitor patient advancement through interdisciplinary progression rounds. If new patient transition needs arise, please place a TOC consult.          Expected Discharge Plan and Services                                               Social Determinants of Health (SDOH) Interventions SDOH Screenings   Food Insecurity: No Food Insecurity (01/26/2023)  Housing: Low Risk  (01/26/2023)  Transportation Needs: No Transportation Needs (01/26/2023)  Utilities: Not At Risk (01/26/2023)  Depression (PHQ2-9): Low Risk  (04/29/2022)  Tobacco Use: High Risk (01/26/2023)    Readmission Risk Interventions     No data to display

## 2023-01-27 NOTE — Telephone Encounter (Signed)
Received VM from pt requesting if MD was aware that she was currently admitted to [facility] for intractable nausea ,vomiting and abdominal pain.  MD out of office.  RN attempt x1 to return call to pt, no answer.  LVM for pt that Oncology on call team will come to assess pt if referral is made by the hospitalist.

## 2023-01-27 NOTE — Progress Notes (Signed)
Patient c/o Phenergan making her nausea worst. Stated that she can taste it and prefer not to have it.

## 2023-01-27 NOTE — Progress Notes (Signed)
I stopped by to check in on Katie Jacobson, however she was not in her room.  We will check back in on Monday.  I have sent a schedule message to arrange labs and f/u for next week with myself or Dr. Pamelia Hoit at [facility].   Lillard Anes, NP 01/27/23 2:22 PM Medical Oncology and Hematology [facility] [address] [place], [place] [address] Tel. [phone]    Fax. [phone]

## 2023-01-28 DIAGNOSIS — R112 Nausea with vomiting, unspecified: Secondary | ICD-10-CM | POA: Diagnosis not present

## 2023-01-28 LAB — CBC
HCT: 33.1 % — ABNORMAL LOW (ref 36.0–46.0)
Hemoglobin: 11.1 g/dL — ABNORMAL LOW (ref 12.0–15.0)
MCH: 31.4 pg (ref 26.0–34.0)
MCHC: 33.5 g/dL (ref 30.0–36.0)
MCV: 93.8 fL (ref 80.0–100.0)
Platelets: 192 10*3/uL (ref 150–400)
RBC: 3.53 MIL/uL — ABNORMAL LOW (ref 3.87–5.11)
RDW: 14.6 % (ref 11.5–15.5)
WBC: 18.7 10*3/uL — ABNORMAL HIGH (ref 4.0–10.5)
nRBC: 0.2 % (ref 0.0–0.2)

## 2023-01-28 LAB — BASIC METABOLIC PANEL
Anion gap: 6 (ref 5–15)
BUN: 5 mg/dL — ABNORMAL LOW (ref 8–23)
CO2: 26 mmol/L (ref 22–32)
Calcium: 8.2 mg/dL — ABNORMAL LOW (ref 8.9–10.3)
Chloride: 104 mmol/L (ref 98–111)
Creatinine, Ser: 0.88 mg/dL (ref 0.44–1.00)
GFR, Estimated: >60 mL/min (ref >60)
Glucose, Bld: 91 mg/dL (ref 70–99)
Potassium: 3.6 mmol/L (ref 3.5–5.1)
Sodium: 136 mmol/L (ref 135–145)

## 2023-01-28 NOTE — Progress Notes (Addendum)
PROGRESS NOTE    AMBERT VIRRUETA  ZOX:[id number] DOB: 11/07/1955 DOA: 01/26/2023 PCP: [name], NP   Brief Narrative: 67 year old female with a history of ER negative breast cancer status post right-sided mastectomy on chemotherapy Taxotere Cytoxan cycle 1  Her last chemotherapy was on January 18, 2023 She saw the oncologist on April 16 with nausea vomiting diarrhea given IV fluids and Zofran She continues to do poorly at home with nausea vomiting and diarrhea, she denied fever, shortness of breath or cough On admission her creatinine was 1.4, potassium 3.2, ANC 2.8 no leukocytosis Unable to keep anything down, generalized weakness CT abdomen pelvis no acute findings UA ketones positive Lipase 25  Other past medical history hyperlipidemia hypertension asthma and obesity. Assessment & Plan:   Principal Problem:   Intractable nausea and vomiting Active Problems:   Essential hypertension   Malignant neoplasm of upper-outer quadrant of right breast in female, estrogen receptor negative   AKI (acute kidney injury)   Hypokalemia   #1 intractable nausea and vomiting in the setting of recent chemo-likely related to chemo toxicity. Will schedule Zofran 3 times a day(check EKG to make sure no QT C prolongation) Will consider Ativan if no response to Zofran Continue IV fluids IMPROVING Advance diet as tolerated Oob ambulate   #2 AKI/dehydration -resolved.presenting with nausea vomiting diarrhea and ketones in the urine.  Continue IV fluids.  Continue to hold thiazide diuretics and ACE inhibitor which she was taking at home.  #3 hypokalemia resolved  #4 malignant neoplasm of the right upper outer quadrant breast ER negative patient started her first cycle of chemotherapy January 18, 2023  #5 hypertension on metoprolol ACE and HCTZ on hold  #6 new leukocytosis-no infection noted Follow levels in am  Estimated body mass index is 40.17 kg/m as calculated from the  following:   Height as of 01/24/23:  (1.549 m).   Weight as of 01/24/23: 96.4 kg.  DVT prophylaxis: LOVENOX Code Status: Full code Family Communication: None at bedside Disposition Plan:  Status is: In patient The patient will require care spanning > 2 midnights and should be moved to inpatient because: Intractable nausea vomiting chemo induced   Consultants:  Oncology  Procedures: None  antimicrobials: None Subjective:  Feels better than yesterday Still npo   Objective: Vitals:   01/27/23 1200 01/27/23 2209 01/28/23 0424 01/28/23 1336  BP: 135/78 (!) 140/71 (!) 149/71 131/62  Pulse: 74 76 73 71  Resp:  Temp:  98.2 F (36.8 C) 98.1 F (36.7 C) 98.7 F (37.1 C)  TempSrc:  Oral Oral   SpO2: 100% 100% 100% 100%    Intake/Output Summary (Last 24 hours) at 01/28/2023 1449 Last data filed at 01/28/2023 1151 Gross per 24 hour  Intake 2303 ml  Output 0 ml  Net 2303 ml    There were no vitals filed for this visit.  Examination:  General exam: Appears in nad  Respiratory system: Clear to auscultation. Respiratory effort normal. Cardiovascular system: S1 & S2 heard, RRR. No JVD, murmurs, rubs, gallops or clicks. No pedal edema. Gastrointestinal system: Abdomen is nondistended, soft and nontender. No organomegaly or masses felt. Normal bowel sounds heard. Central nervous system: Alert and oriented. No focal neurological deficits. Extremities: no edema Skin: No rashes, lesions or ulcers Psychiatry: Judgement and insight appear normal. Mood & affect appropriate.     Data Reviewed: I have personally reviewed following labs and imaging studies  CBC: Recent Labs  Lab 01/24/23 1132  01/26/23 1016 01/27/23 0450 01/28/23 0320  WBC 1.6* 5.6 10.2 18.7*  NEUTROABS 0.8* 2.8  --   --   HGB 13.8 13.0 12.3 11.1*  HCT 39.3 37.6 36.4 33.1*  MCV 91.2 90.4 93.3 93.8  PLT 162 216 209 192    Basic Metabolic Panel: Recent Labs  Lab 01/24/23 1132 01/26/23 1016  01/27/23 0450 01/27/23 1655 01/28/23 0320  NA 137 137 132* 134* 136  K 3.8 3.2* 2.9* 3.8 3.6  CL 101 99 100 101 104  CO2 GLUCOSE 102* 107* 111* 90 91  BUN 6* 5*  CREATININE 1.16* 1.40* 1.02* 0.97 0.88  CALCIUM 10.0 9.6 7.9* 8.0* 8.2*  MG  --   --  1.9  --   --     GFR: Estimated Creatinine Clearance: 68.5 mL/min (by C-G formula based on SCr of 0.88 mg/dL). Liver Function Tests: Recent Labs  Lab 01/24/23 1132 01/26/23 1016  AST 15 14*  ALT 17 17  ALKPHOS 64 67  BILITOT 1.0 0.7  PROT 7.3 7.0  ALBUMIN 3.9 3.7    Recent Labs  Lab 01/26/23 1016  LIPASE 25    No results for input(s): "AMMONIA" in the last 168 hours. Coagulation Profile: No results for input(s): "INR", "PROTIME" in the last 168 hours. Cardiac Enzymes: No results for input(s): "CKTOTAL", "CKMB", "CKMBINDEX", "TROPONINI" in the last 168 hours. BNP (last 3 results) No results for input(s): "PROBNP" in the last 8760 hours. HbA1C: No results for input(s): "HGBA1C" in the last 72 hours. CBG: No results for input(s): "GLUCAP" in the last 168 hours. Lipid Profile: No results for input(s): "CHOL", "HDL", "LDLCALC", "TRIG", "CHOLHDL", "LDLDIRECT" in the last 72 hours. Thyroid Function Tests: No results for input(s): "TSH", "T4TOTAL", "FREET4", "T3FREE", "THYROIDAB" in the last 72 hours. Anemia Panel: No results for input(s): "VITAMINB12", "FOLATE", "FERRITIN", "TIBC", "IRON", "RETICCTPCT" in the last 72 hours. Sepsis Labs: No results for input(s): "PROCALCITON", "LATICACIDVEN" in the last 168 hours.  No results found for this or any previous visit (from the past 240 hour(s)).       Radiology Studies: No results found.      Scheduled Meds:  Chlorhexidine Gluconate Cloth  6 each Topical Daily   enoxaparin (LOVENOX) injection  40 mg Subcutaneous Q24H   metoprolol succinate  100 mg Oral QHS   ondansetron (ZOFRAN) IV  4 mg Intravenous Q8H   sodium chloride flush  10-40 mL  Intracatheter Q12H   Continuous Infusions:  lactated ringers 125 mL/hr at 01/28/23 0033     LOS: 1 day   Time spent: 38 min  Alwyn Ren, MD  01/28/2023, 2:49 PM

## 2023-01-29 DIAGNOSIS — R112 Nausea with vomiting, unspecified: Secondary | ICD-10-CM | POA: Diagnosis not present

## 2023-01-29 LAB — CBC
HCT: 31.5 % — ABNORMAL LOW (ref 36.0–46.0)
Hemoglobin: 10.3 g/dL — ABNORMAL LOW (ref 12.0–15.0)
MCH: 30.7 pg (ref 26.0–34.0)
MCHC: 32.7 g/dL (ref 30.0–36.0)
MCV: 93.8 fL (ref 80.0–100.0)
Platelets: 185 10*3/uL (ref 150–400)
RBC: 3.36 MIL/uL — ABNORMAL LOW (ref 3.87–5.11)
RDW: 14.7 % (ref 11.5–15.5)
WBC: 14.3 10*3/uL — ABNORMAL HIGH (ref 4.0–10.5)
nRBC: 0.3 % — ABNORMAL HIGH (ref 0.0–0.2)

## 2023-01-29 LAB — COMPREHENSIVE METABOLIC PANEL
ALT: 16 U/L (ref 0–44)
AST: 17 U/L (ref 15–41)
Albumin: 2.1 g/dL — ABNORMAL LOW (ref 3.5–5.0)
Alkaline Phosphatase: 73 U/L (ref 38–126)
Anion gap: 4 — ABNORMAL LOW (ref 5–15)
BUN: <5 mg/dL — ABNORMAL LOW (ref 8–23)
CO2: 26 mmol/L (ref 22–32)
Calcium: 7.7 mg/dL — ABNORMAL LOW (ref 8.9–10.3)
Chloride: 107 mmol/L (ref 98–111)
Creatinine, Ser: 0.89 mg/dL (ref 0.44–1.00)
GFR, Estimated: >60 mL/min (ref >60)
Glucose, Bld: 80 mg/dL (ref 70–99)
Potassium: 3.5 mmol/L (ref 3.5–5.1)
Sodium: 137 mmol/L (ref 135–145)
Total Bilirubin: 0.4 mg/dL (ref 0.3–1.2)
Total Protein: 4.4 g/dL — ABNORMAL LOW (ref 6.5–8.1)

## 2023-01-29 MED ORDER — HEPARIN SOD (PORK) LOCK FLUSH 100 UNIT/ML IV SOLN
500.0000 [IU] | INTRAVENOUS | Status: AC | PRN
  Administered 2023-01-29: 500 [IU]

## 2023-01-29 NOTE — Progress Notes (Signed)
Assessment unchanged. Pt verbalized understanding of dc instructions through teach back including medications and follow up care. Discharged via wc to front entrance accompanied by NT. ?

## 2023-01-29 NOTE — Discharge Summary (Signed)
Physician Discharge Summary  Katie Jacobson:[id number] DOB: 1956-07-06 DOA: 01/26/2023  PCP: [name], NP  Admit date: 01/26/2023 Discharge date: 01/29/2023  Admitted From: home Disposition: home Recommendations for Outpatient Follow-up:  Follow up with PCP in 1-2 weeks Please obtain BMP/CBC in one week Please follow up with dr Pamelia Hoit  Home Health:none Equipment/Devices:none  Discharge Condition:stable CODE STATUS:full Diet recommendation:cardiac Brief/Interim Summary: 67 year old female with a history of ER negative breast cancer status post right-sided mastectomy on chemotherapy Taxotere Cytoxan cycle 1  Her last chemotherapy was on January 18, 2023 She saw the oncologist on April 16 with nausea vomiting diarrhea given IV fluids and Zofran She continues to do poorly at home with nausea vomiting and diarrhea, she denied fever, shortness of breath or cough On admission her creatinine was 1.4, potassium 3.2, ANC 2.8 no leukocytosis Unable to keep anything down, generalized weakness CT abdomen pelvis no acute findings UA ketones positive Lipase 25 Other past medical history hyperlipidemia hypertension asthma and obesity.  Discharge Diagnoses:  Principal Problem:   Intractable nausea and vomiting Active Problems:   Essential hypertension   Malignant neoplasm of upper-outer quadrant of right breast in female, estrogen receptor negative   AKI (acute kidney injury)   Hypokalemia    #1 intractable nausea and vomiting in the setting of recent chemo-likely related to chemo. She was treated with IV fluids around-the-clock Zofran and n.p.o.  Her symptoms improved with symptomatic treatments.  She was able to tolerate a diet prior to discharge.   #2 AKI/dehydration -resolved.presented with nausea vomiting diarrhea and ketones in the urine.  Treated with IV fluids.  Thiazide diuretics and vasotec were on hold during her hospital stay.  At the time of discharge I told her to  stop taking thiazide diuretics for a week and taking the Vasotec only if her blood pressure is high for systolic blood pressure above 811   #3 hypokalemia resolved   #4 malignant neoplasm of the right upper outer quadrant breast ER negative patient started her first cycle of chemotherapy January 18, 2023 She will follow-up with the oncologist.   #5 hypertension on metoprolol   #6  leukocytosis-white count was 14 trending down from 18 on discharge.  There was no evidence of infection.  Estimated body mass index is 40.17 kg/m as calculated from the following:   Height as of 01/24/23: 5\' 1"  (1.549 m).   Weight as of 01/24/23: 96.4 kg.  Discharge Instructions  Discharge Instructions     Ambulatory Referral for Lung Cancer Scre   Complete by: As directed    Diet - low sodium heart healthy   Complete by: As directed    Increase activity slowly   Complete by: As directed       Allergies as of 01/29/2023       Reactions   Sulfa Antibiotics Other (See Comments)   UNKNOWN        Medication List     STOP taking these medications    prochlorperazine 10 MG tablet Commonly known as: COMPAZINE       TAKE these medications    albuterol 108 (90 Base) MCG/ACT inhaler Commonly known as: VENTOLIN HFA Inhale 1-2 puffs into the lungs every 6 (six) hours as needed for wheezing or shortness of breath.   aspirin 81 MG tablet Take 1 tablet (81 mg total) by mouth daily.   chlorthalidone 25 MG tablet Commonly known as: HYGROTON TAKE 1 TABLET(25 MG) BY MOUTH EVERY OTHER DAY What changed:  how  much to take how to take this when to take this additional instructions   enalapril 20 MG tablet Commonly known as: VASOTEC TAKE 1 TABLET(20 MG) BY MOUTH AT BEDTIME What changed:  how much to take how to take this when to take this additional instructions   fluticasone 50 MCG/ACT nasal spray Commonly known as: FLONASE Place 2 sprays into both nostrils as needed. What changed:  when  to take this reasons to take this   lidocaine-prilocaine cream Commonly known as: EMLA Apply to affected area once What changed:  how much to take how to take this when to take this reasons to take this additional instructions   metoprolol succinate 100 MG 24 hr tablet Commonly known as: TOPROL-XL Take 1 tablet (100 mg total) by mouth daily. Take with or immediately following a meal. What changed: when to take this   ondansetron 4 MG disintegrating tablet Commonly known as: ZOFRAN-ODT Take 1 tablet (4 mg total) by mouth every 8 (eight) hours as needed for up to 15 doses for nausea or vomiting.   ondansetron 8 MG tablet Commonly known as: Zofran Take 1 tablet (8 mg total) by mouth every 8 (eight) hours as needed for nausea or vomiting. Start on the third day after chemotherapy.   promethazine 25 MG suppository Commonly known as: PHENERGAN Place 1 suppository (25 mg total) rectally every 6 (six) hours as needed for up to 12 days for nausea or vomiting.   triamcinolone ointment 0.5 % Commonly known as: KENALOG Apply 1 Application topically 2 (two) times daily.        Follow-up Information     [facility] Emergency Department at [facility]. Go to .   Specialty: Emergency Medicine Why: If symptoms worsen Contact information: [address] [id number] [address] [place] [address] [phone]        [name], NP Follow up.   Specialty: Internal Medicine Contact information: [address] [place] [address] [phone]         [name], MD Follow up.   Specialty: Hematology and Oncology Contact information: [address] [place] [address] [phone]                Allergies  Allergen Reactions   Sulfa Antibiotics Other (See Comments)    UNKNOWN    Consultations: oncology Procedures/Studies: CT ABDOMEN PELVIS W CONTRAST  Result Date: 01/26/2023 CLINICAL  DATA:  Nausea, vomiting.  History of breast cancer. EXAM: CT ABDOMEN AND PELVIS WITH CONTRAST TECHNIQUE: Multidetector CT imaging of the abdomen and pelvis was performed using the standard protocol following bolus administration of intravenous contrast. RADIATION DOSE REDUCTION: This exam was performed according to the departmental dose-optimization program which includes automated exposure control, adjustment of the mA and/or kV according to patient size and/or use of iterative reconstruction technique. CONTRAST:  80mL OMNIPAQUE IOHEXOL 300 MG/ML  SOLN COMPARISON:  October 24, 2022. FINDINGS: Lower chest: No acute abnormality. Hepatobiliary: No focal liver abnormality is seen. No gallstones, gallbladder wall thickening, or biliary dilatation. Pancreas: Unremarkable. No pancreatic ductal dilatation or surrounding inflammatory changes. Spleen: Normal in size without focal abnormality. Adrenals/Urinary Tract: Adrenal glands are unremarkable. Kidneys are normal, without renal calculi, focal lesion, or hydronephrosis. Bladder is unremarkable. Stomach/Bowel: Stomach is within normal limits. Status post appendectomy. No evidence of bowel wall thickening, distention, or inflammatory changes. Vascular/Lymphatic: Aortic atherosclerosis. No enlarged abdominal or pelvic lymph nodes. Reproductive: Status post hysterectomy. No adnexal masses. Other: No abdominal  wall hernia or abnormality. No abdominopelvic ascites. Musculoskeletal: No acute or significant osseous findings. IMPRESSION: No acute abnormality seen in the abdomen or pelvis. Aortic Atherosclerosis (ICD10-I70.0). Electronically Signed   By: Lupita Raider M.D.   On: 01/26/2023 13:30   (Echo, Carotid, EGD, Colonoscopy, ERCP)    Subjective:  She is resting in bed she did not have any vomiting today she ate some breakfast and lunch Discharge Exam: Vitals:   01/29/23 0526 01/29/23 1330  BP: (!) 120/58 (!) 131/58  Pulse: 69 65  Resp: 18 18  Temp: 97.8 F  (36.6 C) 98.4 F (36.9 C)  SpO2: 99% 100%   Vitals:   01/28/23 2218 01/28/23 2220 01/29/23 0526 01/29/23 1330  BP: (!) 130/56 (!) 130/56 (!) 120/58 (!) 131/58  Pulse: 70 68 69 65  Resp:   18 18  Temp:   97.8 F (36.6 C) 98.4 F (36.9 C)  TempSrc:   Oral Oral  SpO2:   99% 100%    General: Pt is alert, awake, not in acute distress Cardiovascular: RRR, S1/S2 +, no rubs, no gallops Respiratory: CTA bilaterally, no wheezing, no rhonchi Abdominal: Soft, NT, ND, bowel sounds + Extremities: no edema, no cyanosis    The results of significant diagnostics from this hospitalization (including imaging, microbiology, ancillary and laboratory) are listed below for reference.     Microbiology: No results found for this or any previous visit (from the past 240 hour(s)).   Labs: BNP (last 3 results) No results for input(s): "BNP" in the last 8760 hours. Basic Metabolic Panel: Recent Labs  Lab 01/26/23 1016 01/27/23 0450 01/27/23 1655 01/28/23 0320 01/29/23 0500  NA 137 132* 134* 136 137  K 3.2* 2.9* 3.8 3.6 3.5  CL 99 100 101 104 107  CO2 25 23 24 26 26   GLUCOSE 107* 111* 90 91 80  BUN 11 8 6* 5* <5*  CREATININE 1.40* 1.02* 0.97 0.88 0.89  CALCIUM 9.6 7.9* 8.0* 8.2* 7.7*  MG  --  1.9  --   --   --    Liver Function Tests: Recent Labs  Lab 01/24/23 1132 01/26/23 1016 01/29/23 0500  AST 15 14* 17  ALT 17 17 16   ALKPHOS 64 67 73  BILITOT 1.0 0.7 0.4  PROT 7.3 7.0 4.4*  ALBUMIN 3.9 3.7 2.1*   Recent Labs  Lab 01/26/23 1016  LIPASE 25   No results for input(s): "AMMONIA" in the last 168 hours. CBC: Recent Labs  Lab 01/24/23 1132 01/26/23 1016 01/27/23 0450 01/28/23 0320 01/29/23 0500  WBC 1.6* 5.6 10.2 18.7* 14.3*  NEUTROABS 0.8* 2.8  --   --   --   HGB 13.8 13.0 12.3 11.1* 10.3*  HCT 39.3 37.6 36.4 33.1* 31.5*  MCV 91.2 90.4 93.3 93.8 93.8  PLT 162 216 209 192 185   Cardiac Enzymes: No results for input(s): "CKTOTAL", "CKMB", "CKMBINDEX", "TROPONINI"  in the last 168 hours. BNP: Invalid input(s): "POCBNP" CBG: No results for input(s): "GLUCAP" in the last 168 hours. D-Dimer No results for input(s): "DDIMER" in the last 72 hours. Hgb A1c No results for input(s): "HGBA1C" in the last 72 hours. Lipid Profile No results for input(s): "CHOL", "HDL", "LDLCALC", "TRIG", "CHOLHDL", "LDLDIRECT" in the last 72 hours. Thyroid function studies No results for input(s): "TSH", "T4TOTAL", "T3FREE", "THYROIDAB" in the last 72 hours.  Invalid input(s): "FREET3" Anemia work up No results for input(s): "VITAMINB12", "FOLATE", "FERRITIN", "TIBC", "IRON", "RETICCTPCT" in the last 72 hours. Urinalysis  Component Value Date/Time   COLORURINE YELLOW 01/26/2023 1439   APPEARANCEUR CLEAR 01/26/2023 1439   LABSPEC 1.010 01/26/2023 1439   PHURINE 5.5 01/26/2023 1439   GLUCOSEU NEGATIVE 01/26/2023 1439   GLUCOSEU NEGATIVE 09/06/2016 1225   HGBUR SMALL (A) 01/26/2023 1439   BILIRUBINUR SMALL (A) 01/26/2023 1439   KETONESUR 15 (A) 01/26/2023 1439   PROTEINUR NEGATIVE 01/26/2023 1439   UROBILINOGEN 0.2 09/06/2016 1225   NITRITE NEGATIVE 01/26/2023 1439   LEUKOCYTESUR NEGATIVE 01/26/2023 1439   Sepsis Labs Recent Labs  Lab 01/26/23 1016 01/27/23 0450 01/28/23 0320 01/29/23 0500  WBC 5.6 10.2 18.7* 14.3*   Microbiology No results found for this or any previous visit (from the past 240 hour(s)).   Time coordinating discharge:  38 minutes  SIGNED: Alwyn Ren, MD  [facility] 01/29/2023, 3:36 PM

## 2023-01-30 ENCOUNTER — Telehealth: Payer: Self-pay | Admitting: *Deleted

## 2023-01-30 ENCOUNTER — Telehealth: Payer: Self-pay | Admitting: Family

## 2023-01-30 ENCOUNTER — Telehealth: Payer: Self-pay

## 2023-01-30 ENCOUNTER — Other Ambulatory Visit: Payer: Self-pay | Admitting: *Deleted

## 2023-01-30 DIAGNOSIS — C50411 Malignant neoplasm of upper-outer quadrant of right female breast: Secondary | ICD-10-CM

## 2023-01-30 NOTE — Telephone Encounter (Signed)
Please contact pt to schedule  hospital follow up visit.

## 2023-01-30 NOTE — Transitions of Care (Post Inpatient/ED Visit) (Signed)
   01/30/2023  Name: Katie Jacobson MRN: [id number] DOB: [birth year]  Today's TOC FU Call Status: Today's TOC FU Call Status:: Successful TOC FU Call Competed TOC FU Call Complete Date: 01/30/23  Transition Care Management Follow-up Telephone Call Date of Discharge: 01/29/23 Discharge Facility: [facility]) Type of Discharge: Inpatient Admission Primary Inpatient Discharge Diagnosis:: intractable nausea and vomiting How have you been since you were released from the hospital?: Better (I am feeling better and I have not needed to take any of the nausea medicaton) Any questions or concerns?: No  Items Reviewed: Did you receive and understand the discharge instructions provided?: Yes Medications obtained and verified?: Yes (Medications Reviewed) (decllines full medication reconciliation at this time) Any new allergies since your discharge?: No Dietary orders reviewed?: NA Do you have support at home?: Yes People in Home: spouse Name of Support/Comfort Primary Source: husband Milinda Hirschfeld  Home Care and Equipment/Supplies: Were Home Health Services Ordered?: NA Any new equipment or medical supplies ordered?: NA  Functional Questionnaire: Do you need assistance with bathing/showering or dressing?: No Do you need assistance with meal preparation?: No Do you need assistance with eating?: No Do you have difficulty maintaining continence: No Do you need assistance with getting out of bed/getting out of a chair/moving?: No Do you have difficulty managing or taking your medications?: No  Follow up appointments reviewed: PCP Follow-up appointment confirmed?: NA (declines PCP follow up as she is seeing Oncology tomorrow) Specialist Hospital Follow-up appointment confirmed?: Yes Date of Specialist follow-up appointment?: 01/31/23 Follow-Up Specialty Provider:: Lillard Anes NP Oncology Do you need transportation to your follow-up appointment?: No Do you understand care options if  your condition(s) worsen?: Yes-patient verbalized understanding  SDOH Interventions Today    Flowsheet Row Most Recent Value  SDOH Interventions   Food Insecurity Interventions Intervention Not Indicated  Transportation Interventions Intervention Not Indicated      TOC Interventions Today    Flowsheet Row Most Recent Value  TOC Interventions   TOC Interventions Discussed/Reviewed TOC Interventions Discussed       Interventions Today    Flowsheet Row Most Recent Value  General Interventions   General Interventions Discussed/Reviewed General Interventions Discussed, Communication with, Durable Medical Equipment (DME)  Durable Medical Equipment (DME) BP Cuff  Communication with PCP/Specialists  [Communicated to PCP office that pt declines follow up at this time due to having other appointments at cancer center]  Education Interventions   Education Provided Provided Education  Provided Verbal Education On Nutrition, Other  [Reviewed to check B/P daily and to resume B/P meds as directed]  Nutrition Interventions   Nutrition Discussed/Reviewed Nutrition Discussed, Fluid intake  [Reviewed to drink adequate amounts of fluids]  Pharmacy Interventions   Pharmacy Dicussed/Reviewed Pharmacy Topics Discussed        SIGNATURE Dudley Major RN, BSN,CCM, CDE Care Management Coordinator [facility] Care Management [phone]

## 2023-01-30 NOTE — Telephone Encounter (Signed)
Received call from pt requesting advice from RN regarding the timing of her antiemetics.  RN educated pt to take prescriptions when she starts to develop slight nausea.  RN also educated pt on p.o hydration while taking p.o antiemetics.  RN also educated pt that she has a f/u appt with NP tomorrow and NP will discuss in further detail.  Pt verbalized understanding.

## 2023-01-31 ENCOUNTER — Inpatient Hospital Stay (HOSPITAL_BASED_OUTPATIENT_CLINIC_OR_DEPARTMENT_OTHER): Payer: 59 | Admitting: Adult Health

## 2023-01-31 ENCOUNTER — Inpatient Hospital Stay: Payer: 59

## 2023-01-31 ENCOUNTER — Other Ambulatory Visit: Payer: Self-pay

## 2023-01-31 ENCOUNTER — Encounter: Payer: Self-pay | Admitting: Adult Health

## 2023-01-31 VITALS — BP 139/72 | HR 70 | Temp 97.3°F | Resp 20 | Ht 61.0 in | Wt 220.3 lb

## 2023-01-31 DIAGNOSIS — Z9049 Acquired absence of other specified parts of digestive tract: Secondary | ICD-10-CM | POA: Diagnosis not present

## 2023-01-31 DIAGNOSIS — Z9221 Personal history of antineoplastic chemotherapy: Secondary | ICD-10-CM | POA: Diagnosis not present

## 2023-01-31 DIAGNOSIS — F17210 Nicotine dependence, cigarettes, uncomplicated: Secondary | ICD-10-CM | POA: Diagnosis not present

## 2023-01-31 DIAGNOSIS — Z171 Estrogen receptor negative status [ER-]: Secondary | ICD-10-CM

## 2023-01-31 DIAGNOSIS — R112 Nausea with vomiting, unspecified: Secondary | ICD-10-CM | POA: Diagnosis not present

## 2023-01-31 DIAGNOSIS — E876 Hypokalemia: Secondary | ICD-10-CM | POA: Diagnosis not present

## 2023-01-31 DIAGNOSIS — N6489 Other specified disorders of breast: Secondary | ICD-10-CM | POA: Diagnosis not present

## 2023-01-31 DIAGNOSIS — Z79633 Long term (current) use of mitotic inhibitor: Secondary | ICD-10-CM | POA: Diagnosis not present

## 2023-01-31 DIAGNOSIS — N179 Acute kidney failure, unspecified: Secondary | ICD-10-CM | POA: Diagnosis not present

## 2023-01-31 DIAGNOSIS — Z90710 Acquired absence of both cervix and uterus: Secondary | ICD-10-CM | POA: Diagnosis not present

## 2023-01-31 DIAGNOSIS — Z882 Allergy status to sulfonamides status: Secondary | ICD-10-CM | POA: Diagnosis not present

## 2023-01-31 DIAGNOSIS — R110 Nausea: Secondary | ICD-10-CM | POA: Diagnosis not present

## 2023-01-31 DIAGNOSIS — R5383 Other fatigue: Secondary | ICD-10-CM | POA: Diagnosis not present

## 2023-01-31 DIAGNOSIS — Z923 Personal history of irradiation: Secondary | ICD-10-CM | POA: Diagnosis not present

## 2023-01-31 DIAGNOSIS — Z823 Family history of stroke: Secondary | ICD-10-CM | POA: Diagnosis not present

## 2023-01-31 DIAGNOSIS — C50411 Malignant neoplasm of upper-outer quadrant of right female breast: Secondary | ICD-10-CM

## 2023-01-31 DIAGNOSIS — Z79630 Long term (current) use of alkylating agent: Secondary | ICD-10-CM | POA: Diagnosis not present

## 2023-01-31 DIAGNOSIS — Z95828 Presence of other vascular implants and grafts: Secondary | ICD-10-CM

## 2023-01-31 DIAGNOSIS — Z7289 Other problems related to lifestyle: Secondary | ICD-10-CM | POA: Diagnosis not present

## 2023-01-31 DIAGNOSIS — Z5111 Encounter for antineoplastic chemotherapy: Secondary | ICD-10-CM | POA: Diagnosis not present

## 2023-01-31 DIAGNOSIS — R21 Rash and other nonspecific skin eruption: Secondary | ICD-10-CM | POA: Diagnosis not present

## 2023-01-31 DIAGNOSIS — Z87442 Personal history of urinary calculi: Secondary | ICD-10-CM | POA: Diagnosis not present

## 2023-01-31 DIAGNOSIS — Z79899 Other long term (current) drug therapy: Secondary | ICD-10-CM | POA: Diagnosis not present

## 2023-01-31 DIAGNOSIS — Z803 Family history of malignant neoplasm of breast: Secondary | ICD-10-CM | POA: Diagnosis not present

## 2023-01-31 DIAGNOSIS — R197 Diarrhea, unspecified: Secondary | ICD-10-CM | POA: Diagnosis not present

## 2023-01-31 LAB — CMP (CANCER CENTER ONLY)
ALT: 20 U/L (ref 0–44)
AST: 17 U/L (ref 15–41)
Albumin: 3.2 g/dL — ABNORMAL LOW (ref 3.5–5.0)
Alkaline Phosphatase: 58 U/L (ref 38–126)
Anion gap: 6 (ref 5–15)
BUN: 5 mg/dL — ABNORMAL LOW (ref 8–23)
CO2: 27 mmol/L (ref 22–32)
Calcium: 8.8 mg/dL — ABNORMAL LOW (ref 8.9–10.3)
Chloride: 108 mmol/L (ref 98–111)
Creatinine: 0.93 mg/dL (ref 0.44–1.00)
GFR, Estimated: >60 mL/min (ref >60)
Glucose, Bld: 88 mg/dL (ref 70–99)
Potassium: 3.8 mmol/L (ref 3.5–5.1)
Sodium: 141 mmol/L (ref 135–145)
Total Bilirubin: 0.3 mg/dL (ref 0.3–1.2)
Total Protein: 6 g/dL — ABNORMAL LOW (ref 6.5–8.1)

## 2023-01-31 LAB — CBC WITH DIFFERENTIAL (CANCER CENTER ONLY)
Abs Immature Granulocytes: 0.56 10*3/uL — ABNORMAL HIGH (ref 0.00–0.07)
Basophils Absolute: 0.1 10*3/uL (ref 0.0–0.1)
Basophils Relative: 1 %
Eosinophils Absolute: 0 10*3/uL (ref 0.0–0.5)
Eosinophils Relative: 0 %
HCT: 33.5 % — ABNORMAL LOW (ref 36.0–46.0)
Hemoglobin: 11.4 g/dL — ABNORMAL LOW (ref 12.0–15.0)
Immature Granulocytes: 5 %
Lymphocytes Relative: 17 %
Lymphs Abs: 1.8 10*3/uL (ref 0.7–4.0)
MCH: 31.5 pg (ref 26.0–34.0)
MCHC: 34 g/dL (ref 30.0–36.0)
MCV: 92.5 fL (ref 80.0–100.0)
Monocytes Absolute: 1.2 10*3/uL — ABNORMAL HIGH (ref 0.1–1.0)
Monocytes Relative: 11 %
Neutro Abs: 7.2 10*3/uL (ref 1.7–7.7)
Neutrophils Relative %: 66 %
Platelet Count: 193 10*3/uL (ref 150–400)
RBC: 3.62 MIL/uL — ABNORMAL LOW (ref 3.87–5.11)
RDW: 14.7 % (ref 11.5–15.5)
Smear Review: NORMAL
WBC Count: 10.9 10*3/uL — ABNORMAL HIGH (ref 4.0–10.5)
nRBC: 0.6 % — ABNORMAL HIGH (ref 0.0–0.2)

## 2023-01-31 MED ORDER — OMEPRAZOLE 40 MG PO CPDR
40.0000 mg | DELAYED_RELEASE_CAPSULE | Freq: Every day | ORAL | 2 refills | Status: DC
Start: 2023-01-31 — End: 2023-04-17

## 2023-01-31 MED ORDER — HEPARIN SOD (PORK) LOCK FLUSH 100 UNIT/ML IV SOLN
500.0000 [IU] | Freq: Once | INTRAVENOUS | Status: AC | PRN
  Administered 2023-01-31: 500 [IU]

## 2023-01-31 MED ORDER — SODIUM CHLORIDE 0.9% FLUSH
10.0000 mL | INTRAVENOUS | Status: DC | PRN
  Administered 2023-01-31: 10 mL

## 2023-01-31 NOTE — Assessment & Plan Note (Signed)
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  Triple negative breast cancer of the left breast: Adjuvant chemotherapy is certainly indicated in her cancer however the Taxotere and Cytoxan was too strong.  Dr. Pamelia Hoit is considering dose reduction and could also consider changing regimen to CMF.  He will see her back on May 1.  I reassured [name] that due to her recent hospitalization and persistent fatigue we can delay that next chemo by 1 week if needed. Fatigue: Recommended energy conservation Persistent nausea: She is going to take the Zofran ODT and Phenergan suppositories as needed.  I prescribed omeprazole 40 mg to take first thing in the morning on an empty stomach.  I am hopeful this will help.  She will return in 1 week for labs, follow-up with Dr. Pamelia Hoit.

## 2023-01-31 NOTE — Progress Notes (Signed)
[facility] Cancer Follow up:    [name], NP [address] [place] [address]   DIAGNOSIS:  Cancer Staging  Malignant neoplasm of upper-outer quadrant of right breast in female, estrogen receptor negative Staging form: Breast, AJCC 7th Edition - Clinical stage from 11/19/2014: Stage IA (T1c, N0, M0) - Unsigned Staged by: Pathologist and managing physician Laterality: Right Estrogen receptor status: Negative Progesterone receptor status: Negative HER2 status: Negative Stage used in treatment planning: Yes National guidelines used in treatment planning: Yes Type of national guideline used in treatment planning: NCCN - Pathologic stage from 03/04/2015: Stage IA (yT1b, N0, cM0) - Signed by [name], MD on 03/16/2015 Staged by: Pathologist Stage prefix: Post-therapy Laterality: Right Estrogen receptor status: Negative Progesterone receptor status: Negative HER2 status: Negative Stage used in treatment planning: Yes National guidelines used in treatment planning: Yes Type of national guideline used in treatment planning: NCCN Staging comments: Staged on final lumpectomy specimen by Dr. Raynald Blend - Pathologic: Stage IIB (T3, N0, cM0) - Signed by [name], MD on 12/28/2022 Stage prefix: Initial diagnosis Laterality: Bilateral Histologic grade (G): G3 Paget's disease: Negative Estrogen receptor status: Negative Progesterone receptor status: Negative   SUMMARY OF ONCOLOGIC HISTORY: Oncology History  Malignant neoplasm of upper-outer quadrant of right breast in female, estrogen receptor negative  10/24/2014 Mammogram   Right breast: ill-defined mass in the UOQ   10/24/2014 Breast US   Right breast: irregular hypoechoic taller than wide mass in the 11 o'clock location, 4 cm from the nipple, measuring 0.9 x 1.1 x 1.2 cm. Evaluation of the right axilla is negative for adenopathy.   11/12/2014 Initial Biopsy   Right breast needle core bx:  Invasive ductal carcinoma, ER- (0%), PR- (0%), HER2/neu negative, Ki67 19%, grade 2-3.   11/20/2014 Procedure   Genetic testing: BreastNext panel Lendon Collar) reveals no clinically significant variant at ATM, BARD1, BRCA1, BRCA2, BRIP1, CDH1, CHEK2, MRE11A, MUTYH, NBN, NF1, PALB2, PTEN, RAD50, RAD51C, RAD51D, and TP53.   11/21/2014 Breast MRI   Right breast mass measuring up to 1.3 cm without lymphadenopathy or findings to suggest multifocal or multicentric disease. Benign circumscribed oval enhancing mass located posterior medial to the malignancy consistent with fibroadenoma   11/24/2014 Clinical Stage   Stage IA: T1c N0   12/02/2014 - 02/03/2015 Neo-Adjuvant Chemotherapy   Cyclophosphamide and docetaxel x 4 cycles   02/09/2015 Breast MRI   Right breast mass measures 1.0 x 0.7 x 0.5 cm, decreased from 1.4 x 1.4 x 1.0 cm. No additional areas of suspicion in right or left breast.   03/02/2015 Definitive Surgery   Right lumpectomy / SLNB Dwain Sarna): IDC, grade 1, with high grade DCIS spanning 1 cmHER2/neu repeated and remains negative (ratio 1.62). 4 LN removed and negative for malignancy (0/4 LN).   03/02/2015 Pathologic Stage   Stage IA: ypT1b ypN0   04/20/2015 - 05/29/2015 Radiation Therapy   Adjuvant RT Basilio Cairo): Right Breast  50 Gy over 25 fractions. Right Breast boost 10 Gy over 5 fractions. Total dose: 60 Gy.   07/28/2015 Survivorship   Survivorship visit completed and copy of care plan provided to patient.   12/15/2022 Surgery   Left lumpectomy: Grade 3 IDC 2.2 cm, margins negative, ER 0%, PR 0%, HER2 negative, Ki-67 95%, 0/2 lymph nodes negative Right mastectomy: Grade 3 IDC 6.6 cm with DCIS, margins negative, ER 2%, PR 0%, HER2 negative, Ki-67 50%   12/28/2022 Cancer Staging   Staging form: Breast, AJCC 7th Edition -  Pathologic: Stage IIB (T3, N0, cM0) - Signed by [name], MD on 12/28/2022 Stage prefix: Initial diagnosis Laterality: Bilateral Histologic grade (G): G3 Paget's  disease: Negative Estrogen receptor status: Negative Progesterone receptor status: Negative   Bilateral malignant neoplasm of breast in female  11/02/2022 Initial Diagnosis   Bilateral malignant neoplasm of breast in female [name])   01/18/2023 -  Chemotherapy   Patient is on Treatment Plan : BREAST TC q21d       CURRENT THERAPY: Taxotere and Cytoxan  INTERVAL HISTORY: Katie Jacobson 67 y.o. female returns for follow-up after her recent hospitalization from January 26, 2023 through January 29, 2023 for non-intractable nausea vomiting diarrhea, dehydration and acute kidney injury.  Since her discharge from the hospital over this past weekend she has not rebounded as she had hoped.  She is feeling slightly better but she is still experiencing intermittent nausea and diarrhea.  She notes her stomach will feel hungry and then she will eat and then she will feel nauseated.  Her energy level is low and she is tearful and worried about having to receive more chemotherapy and particularly more chemotherapy on May 1.   Patient Active Problem List   Diagnosis Date Noted   Intractable nausea and vomiting 01/26/2023   AKI (acute kidney injury) 01/26/2023   Hypokalemia 01/26/2023   Port-A-Cath in place 01/24/2023   Breast cancer 12/15/2022   Bilateral malignant neoplasm of breast in female 11/02/2022   Allergic reaction to chemical substance 01/30/2022   Preventative health care 01/28/2022   Cervical radiculopathy 04/20/2021   Primary osteoarthritis of left knee 03/09/2021   Cervical strain 02/09/2021   Sprain of medial collateral ligament of left knee 02/09/2021   Mitral regurgitation due to cusp prolapse 01/02/2020   Genetic testing 01/25/2016   Microscopic hematuria 09/06/2015   Vaginal dryness 12/09/2014   Family history of breast cancer    Malignant neoplasm of upper-outer quadrant of right breast in female, estrogen receptor negative 11/14/2014   Intermittent palpitations    Obesity (BMI  35.0-39.9 without comorbidity)    Hyperlipidemia LDL goal <100 07/07/2011   History of partial seizures 04/01/2011   Asthma 04/01/2011   Mitral valve prolapse 04/01/2011   Hot flashes 04/01/2011   Tobacco abuse 04/01/2011   Essential hypertension 04/01/2011    is allergic to sulfa antibiotics.  MEDICAL HISTORY: Past Medical History:  Diagnosis Date   Allergy    Arthritis    Breast cancer of upper-outer quadrant of right female breast 11/14/2014   Treated with chemotherapy and radiation   Heart murmur    History of chemotherapy    finished chemo 02/03/2015   History of kidney stones    History of seizures    as a child - unknown cause - states was never on anticonvulsants   Hypertension    states under control with meds., has been on med. x "years"   Mitral valve prolapse 1990   Echo 2023 with no MVP. Mild-mod aortic regurg and mild mitral regurg. 3 yr followup recommended.   Osteoporosis    Personal history of chemotherapy    Personal history of radiation therapy    Seasonal allergies     SURGICAL HISTORY: Past Surgical History:  Procedure Laterality Date   ABDOMINAL HYSTERECTOMY  1998   partial   APPENDECTOMY  1976   BREAST BIOPSY Right 09/30/2022   [place] RT BREAST BX W LOC DEV 1ST LESION IMG BX SPEC US GUIDE 09/30/2022 [facility]   BREAST BIOPSY  Left 09/30/2022   [place] LT BREAST BX W LOC DEV 1ST LESION IMG BX SPEC US GUIDE 09/30/2022 [facility]   BREAST BIOPSY Right 09/30/2022   [place] RT BREAST BX W LOC DEV EA ADD LESION IMG BX SPEC US GUIDE 09/30/2022 [facility]   BREAST BIOPSY  12/14/2022   MM LT RADIOACTIVE SEED LOC MAMMO GUIDE 12/14/2022 [facility]   BREAST LUMPECTOMY Right 02/2015   BREAST LUMPECTOMY WITH RADIOACTIVE SEED AND SENTINEL LYMPH NODE BIOPSY Left 12/15/2022   Procedure: LEFT BREAST LUMPECTOMY WITH RADIOACTIVE SEED AND AXILLARY SENTINEL LYMPH NODE BIOPSY;  Surgeon: Emelia Loron, MD;  Location: [facility] OR;  Service: General;   Laterality: Left;   COLONOSCOPY  09/30/2016   KNEE ARTHROSCOPY Left 03/26/2008   MASTECTOMY W/ SENTINEL NODE BIOPSY Right 12/15/2022   Procedure: RIGHT MASTECTOMY WITH RIGHT AXILLARY SENTINEL LYMPH NODE BIOPSY;  Surgeon: Emelia Loron, MD;  Location: [facility] OR;  Service: General;  Laterality: Right;  180 MIN ROOM 9   PLANTAR'S WART EXCISION Left    x 2   PORT-A-CATH REMOVAL Left 04/06/2015   Procedure: REMOVAL PORT-A-CATH;  Surgeon: Emelia Loron, MD;  Location: [facility];  Service: General;  Laterality: Left;   PORTACATH PLACEMENT N/A 11/27/2014   Procedure: INSERTION PORT-A-CATH;  Surgeon: Emelia Loron, MD;  Location: [facility];  Service: General;  Laterality: N/A;   PORTACATH PLACEMENT Right 12/15/2022   Procedure: INSERTION PORT-A-CATH;  Surgeon: Emelia Loron, MD;  Location: [facility] OR;  Service: General;  Laterality: Right;   RADIOACTIVE SEED GUIDED PARTIAL MASTECTOMY WITH AXILLARY SENTINEL LYMPH NODE BIOPSY Right 03/02/2015   Procedure: RADIOACTIVE SEED GUIDED RIGHT PARTIAL MASTECTOMY WITH RIGHT AXILLARY SENTINEL LYMPH NODE BIOPSY;  Surgeon: Emelia Loron, MD;  Location: [facility];  Service: General;  Laterality: Right;   SALIVARY STONE REMOVAL Right 1972   TONSILLECTOMY  1970s   TRANSTHORACIC ECHOCARDIOGRAM  01/16/2020   EF 60 to 65%.  GRII DD.  No R WMA.  Normal RV size and function.  Rheumatic mitral valve with moderate thickening-hockey-stick appearing.  Moderate MR with mild MS.  Moderate LA dilation.  (Recommend follow-up in 2 to 3 years)   TRANSTHORACIC ECHOCARDIOGRAM  05/31/2022   Normal LV size and function-EF 60 to 65%.  No RWMA. ??  Normal diastolic parameters but elevated LAP?  Normal RV with normal RVP and RAP.  Manage mitral valve with mild MR and no MS.  Mild to moderate AI with aortic sclerosis but no stenosis.   TUBAL LIGATION  1996    SOCIAL HISTORY: Social History   Socioeconomic History   Marital status:  Married    Spouse name: Not on file   Number of children: 2   Years of education: Not on file   Highest education level: Not on file  Occupational History   Not on file  Tobacco Use   Smoking status: Every Day    Packs/day: 0.50    Years: 43.00    Additional pack years: 0.00    Total pack years: 21.50    Types: Cigarettes   Smokeless tobacco: Former    Quit date: 09/09/2016   Tobacco comments:    10 cig./day  Vaping Use   Vaping Use: Never used  Substance and Sexual Activity   Alcohol use: Yes    Alcohol/week: 5.0 standard drinks of alcohol    Types: 5 Standard drinks or equivalent per week    Comment: occ    Drug use: No   Sexual  activity: Yes    Partners: Male  Other Topics Concern   Not on file  Social History Narrative   Married with 2 daughters- born 33 and 61 ([name] died in MVA 2016-02-12)   Previously worked -- Therapist, 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  Has been doing part time work with [employer] -- had 6 active clients, but the most important one is her Daughter.   Regular exercise:  No   Caffeine Use: none; Current 1ppd smoker x > 39 yrs; EtOH ~4 oz/week   Social Determinants of Health   Financial Resource Strain: Not on file  Food Insecurity: No Food Insecurity (01/30/2023)   Hunger Vital Sign    Worried About Running Out of Food in the Last Year: Never true    Ran Out of Food in the Last Year: Never true  Transportation Needs: No Transportation Needs (01/30/2023)   PRAPARE - Administrator, Civil Service (Medical): No    Lack of Transportation (Non-Medical): No  Physical Activity: Not on file  Stress: Not on file  Social Connections: Not on file  Intimate Partner Violence: Not At Risk (01/26/2023)   Humiliation, Afraid, Rape, and Kick questionnaire    Fear of Current or Ex-Partner: No    Emotionally Abused: No    Physically Abused: No    Sexually Abused: No    FAMILY HISTORY: Family History  Problem Relation Age of  Onset   Heart disease Mother    Stroke Mother    Hypertension Mother    Diabetes Mother    Diabetes Sister    Kidney disease Brother    Mental retardation Brother    Diabetes Sister    Heart attack Father    Breast cancer Cousin        deceased 70   Colon polyps Daughter    Colon cancer Neg Hx    Esophageal cancer Neg Hx    Rectal cancer Neg Hx    Stomach cancer Neg Hx     Review of Systems  Constitutional:  Positive for fatigue. Negative for appetite change, chills, fever and unexpected weight change.  HENT:   Negative for hearing loss, lump/mass and trouble swallowing.   Eyes:  Negative for eye problems and icterus.  Respiratory:  Negative for chest tightness, cough and shortness of breath.   Cardiovascular:  Negative for chest pain, leg swelling and palpitations.  Gastrointestinal:  Positive for nausea. Negative for abdominal distention, abdominal pain, constipation, diarrhea and vomiting.  Endocrine: Negative for hot flashes.  Genitourinary:  Negative for difficulty urinating.   Musculoskeletal:  Negative for arthralgias.  Skin:  Negative for itching and rash.  Neurological:  Negative for dizziness, extremity weakness, headaches and numbness.  Hematological:  Negative for adenopathy. Does not bruise/bleed easily.  Psychiatric/Behavioral:  Negative for depression. The patient is not nervous/anxious.       PHYSICAL EXAMINATION    Vitals:   01/31/23 1115  BP: 139/72  Pulse: 70  Resp: 20  Temp: (!) 97.3 F (36.3 C)  SpO2: 100%    Physical Exam Constitutional:      General: She is not in acute distress.    Appearance: Normal appearance. She is not toxic-appearing.  HENT:     Head: Normocephalic and atraumatic.     Mouth/Throat:     Mouth: Mucous membranes are moist.     Pharynx: Oropharynx is clear. No oropharyngeal exudate or posterior oropharyngeal erythema.  Eyes:  General: No scleral icterus. Cardiovascular:     Rate and Rhythm: Normal rate and  regular rhythm.     Pulses: Normal pulses.     Heart sounds: Normal heart sounds.  Pulmonary:     Effort: Pulmonary effort is normal.     Breath sounds: Normal breath sounds.  Abdominal:     General: Abdomen is flat. Bowel sounds are normal. There is no distension.     Palpations: Abdomen is soft.     Tenderness: There is no abdominal tenderness.  Musculoskeletal:        General: No swelling.     Cervical back: Neck supple.  Lymphadenopathy:     Cervical: No cervical adenopathy.  Skin:    General: Skin is warm and dry.     Findings: No rash.  Neurological:     General: No focal deficit present.     Mental Status: She is alert.  Psychiatric:        Mood and Affect: Mood normal.        Behavior: Behavior normal.     LABORATORY DATA:  CBC    Component Value Date/Time   WBC 10.9 (H) 01/31/2023 1047   WBC 14.3 (H) 01/29/2023 0500   RBC 3.62 (L) 01/31/2023 1047   HGB 11.4 (L) 01/31/2023 1047   HGB 14.4 12/28/2016 1359   HCT 33.5 (L) 01/31/2023 1047   HCT 42.7 12/28/2016 1359   PLT 193 01/31/2023 1047   PLT 213 12/28/2016 1359   MCV 92.5 01/31/2023 1047   MCV 93.6 12/28/2016 1359   MCH 31.5 01/31/2023 1047   MCHC 34.0 01/31/2023 1047   RDW 14.7 01/31/2023 1047   RDW 15.8 (H) 12/28/2016 1359   LYMPHSABS PENDING 01/31/2023 1047   LYMPHSABS 2.1 12/28/2016 1359   MONOABS PENDING 01/31/2023 1047   MONOABS 0.4 12/28/2016 1359   EOSABS PENDING 01/31/2023 1047   EOSABS 0.1 12/28/2016 1359   BASOSABS PENDING 01/31/2023 1047   BASOSABS 0.0 12/28/2016 1359    CMP     Component Value Date/Time   NA 141 01/31/2023 1047   NA 139 12/28/2016 1359   K 3.8 01/31/2023 1047   K 3.6 12/28/2016 1359   CL 108 01/31/2023 1047   CO2 27 01/31/2023 1047   CO2 25 12/28/2016 1359   GLUCOSE 88 01/31/2023 1047   GLUCOSE 85 12/28/2016 1359   BUN 5 (L) 01/31/2023 1047   BUN 15.6 12/28/2016 1359   CREATININE 0.93 01/31/2023 1047   CREATININE 0.90 01/28/2022 1509   CREATININE 0.9  12/28/2016 1359   CALCIUM 8.8 (L) 01/31/2023 1047   CALCIUM 9.7 12/28/2016 1359   PROT 6.0 (L) 01/31/2023 1047   PROT 7.5 12/28/2016 1359   ALBUMIN 3.2 (L) 01/31/2023 1047   ALBUMIN 3.7 12/28/2016 1359   AST 17 01/31/2023 1047   AST 14 12/28/2016 1359   ALT 20 01/31/2023 1047   ALT 17 12/28/2016 1359   ALKPHOS 58 01/31/2023 1047   ALKPHOS 86 12/28/2016 1359   BILITOT 0.3 01/31/2023 1047   BILITOT 0.35 12/28/2016 1359   GFRNONAA >60 01/31/2023 1047   GFRNONAA 70 09/02/2013 1128   GFRAA >60 12/28/2017 1342   GFRAA 81 09/02/2013 1128      ASSESSMENT and THERAPY PLAN:   Malignant neoplasm of upper-outer quadrant of right breast in female, estrogen receptor negative Katie Jacobson is a 67 year old woman with right breast ductal carcinoma in situ status postmastectomy and left breast invasive ductal carcinoma, triple negative here today for follow-up and evaluation  after her hospitalization after receiving her first cycle of adjuvant chemotherapy with Taxotere and Cytoxan.  Triple negative breast cancer of the left breast: Adjuvant chemotherapy is certainly indicated in her cancer however the Taxotere and Cytoxan was too strong.  Dr. Pamelia Hoit is considering dose reduction and could also consider changing regimen to CMF.  He will see her back on May 1.  I reassured Crystalynn that due to her recent hospitalization and persistent fatigue we can delay that next chemo by 1 week if needed. Fatigue: Recommended energy conservation Persistent nausea: She is going to take the Zofran ODT and Phenergan suppositories as needed.  I prescribed omeprazole 40 mg to take first thing in the morning on an empty stomach.  I am hopeful this will help.  She will return in 1 week for labs, follow-up with Dr. Pamelia Hoit.    All questions were answered. The patient knows to call the clinic with any problems, questions or concerns. We can certainly see the patient much sooner if necessary.  Total encounter time:20 minutes*in  face-to-face visit time, chart review, lab review, care coordination, order entry, and documentation of the encounter time.    Lillard Anes, NP 01/31/23 12:05 PM Medical Oncology and Hematology [facility] [address] [place], [place] [address] Tel. [phone]    Fax. [phone]  *Total Encounter Time as defined by the Centers for Medicare and Medicaid Services includes, in addition to the face-to-face time of a patient visit (documented in the note above) non-face-to-face time: obtaining and reviewing outside history, ordering and reviewing medications, tests or procedures, care coordination (communications with other health care professionals or caregivers) and documentation in the medical record.

## 2023-02-01 NOTE — Telephone Encounter (Signed)
Patient declined to follow up at this time

## 2023-02-03 ENCOUNTER — Other Ambulatory Visit: Payer: Self-pay

## 2023-02-07 MED FILL — Dexamethasone Sodium Phosphate Inj 100 MG/10ML: INTRAMUSCULAR | Qty: 1 | Status: AC

## 2023-02-08 ENCOUNTER — Inpatient Hospital Stay: Payer: 59

## 2023-02-08 ENCOUNTER — Other Ambulatory Visit: Payer: Self-pay

## 2023-02-08 ENCOUNTER — Inpatient Hospital Stay (HOSPITAL_BASED_OUTPATIENT_CLINIC_OR_DEPARTMENT_OTHER): Payer: 59 | Admitting: Hematology and Oncology

## 2023-02-08 ENCOUNTER — Other Ambulatory Visit: Payer: Self-pay | Admitting: *Deleted

## 2023-02-08 ENCOUNTER — Other Ambulatory Visit (HOSPITAL_COMMUNITY): Payer: 59

## 2023-02-08 ENCOUNTER — Inpatient Hospital Stay: Payer: 59 | Attending: Hematology and Oncology

## 2023-02-08 VITALS — BP 123/70 | HR 73 | Temp 97.9°F | Resp 17

## 2023-02-08 VITALS — BP 153/66 | HR 89 | Temp 97.3°F | Resp 18 | Ht 61.0 in | Wt 213.8 lb

## 2023-02-08 DIAGNOSIS — Z79630 Long term (current) use of alkylating agent: Secondary | ICD-10-CM | POA: Diagnosis not present

## 2023-02-08 DIAGNOSIS — Z882 Allergy status to sulfonamides status: Secondary | ICD-10-CM | POA: Diagnosis not present

## 2023-02-08 DIAGNOSIS — C50411 Malignant neoplasm of upper-outer quadrant of right female breast: Secondary | ICD-10-CM | POA: Insufficient documentation

## 2023-02-08 DIAGNOSIS — R5383 Other fatigue: Secondary | ICD-10-CM | POA: Insufficient documentation

## 2023-02-08 DIAGNOSIS — N6489 Other specified disorders of breast: Secondary | ICD-10-CM | POA: Diagnosis not present

## 2023-02-08 DIAGNOSIS — Z5189 Encounter for other specified aftercare: Secondary | ICD-10-CM | POA: Diagnosis not present

## 2023-02-08 DIAGNOSIS — Z79633 Long term (current) use of mitotic inhibitor: Secondary | ICD-10-CM | POA: Diagnosis not present

## 2023-02-08 DIAGNOSIS — R221 Localized swelling, mass and lump, neck: Secondary | ICD-10-CM

## 2023-02-08 DIAGNOSIS — Z923 Personal history of irradiation: Secondary | ICD-10-CM | POA: Diagnosis not present

## 2023-02-08 DIAGNOSIS — C50911 Malignant neoplasm of unspecified site of right female breast: Secondary | ICD-10-CM

## 2023-02-08 DIAGNOSIS — Z5111 Encounter for antineoplastic chemotherapy: Secondary | ICD-10-CM | POA: Diagnosis not present

## 2023-02-08 DIAGNOSIS — C50912 Malignant neoplasm of unspecified site of left female breast: Secondary | ICD-10-CM | POA: Diagnosis not present

## 2023-02-08 DIAGNOSIS — Z79899 Other long term (current) drug therapy: Secondary | ICD-10-CM | POA: Insufficient documentation

## 2023-02-08 DIAGNOSIS — Z95828 Presence of other vascular implants and grafts: Secondary | ICD-10-CM

## 2023-02-08 DIAGNOSIS — Z171 Estrogen receptor negative status [ER-]: Secondary | ICD-10-CM | POA: Insufficient documentation

## 2023-02-08 DIAGNOSIS — R531 Weakness: Secondary | ICD-10-CM | POA: Insufficient documentation

## 2023-02-08 DIAGNOSIS — R112 Nausea with vomiting, unspecified: Secondary | ICD-10-CM | POA: Insufficient documentation

## 2023-02-08 LAB — CBC WITH DIFFERENTIAL (CANCER CENTER ONLY)
Abs Immature Granulocytes: 0.03 10*3/uL (ref 0.00–0.07)
Basophils Absolute: 0.1 10*3/uL (ref 0.0–0.1)
Basophils Relative: 1 %
Eosinophils Absolute: 0.1 10*3/uL (ref 0.0–0.5)
Eosinophils Relative: 2 %
HCT: 35 % — ABNORMAL LOW (ref 36.0–46.0)
Hemoglobin: 11.7 g/dL — ABNORMAL LOW (ref 12.0–15.0)
Immature Granulocytes: 1 %
Lymphocytes Relative: 21 %
Lymphs Abs: 1.3 10*3/uL (ref 0.7–4.0)
MCH: 31.6 pg (ref 26.0–34.0)
MCHC: 33.4 g/dL (ref 30.0–36.0)
MCV: 94.6 fL (ref 80.0–100.0)
Monocytes Absolute: 0.7 10*3/uL (ref 0.1–1.0)
Monocytes Relative: 11 %
Neutro Abs: 4 10*3/uL (ref 1.7–7.7)
Neutrophils Relative %: 64 %
Platelet Count: 334 10*3/uL (ref 150–400)
RBC: 3.7 MIL/uL — ABNORMAL LOW (ref 3.87–5.11)
RDW: 15.9 % — ABNORMAL HIGH (ref 11.5–15.5)
WBC Count: 6.2 10*3/uL (ref 4.0–10.5)
nRBC: 0.3 % — ABNORMAL HIGH (ref 0.0–0.2)

## 2023-02-08 LAB — CMP (CANCER CENTER ONLY)
ALT: 16 U/L (ref 0–44)
AST: 14 U/L — ABNORMAL LOW (ref 15–41)
Albumin: 3.6 g/dL (ref 3.5–5.0)
Alkaline Phosphatase: 53 U/L (ref 38–126)
Anion gap: 5 (ref 5–15)
BUN: 14 mg/dL (ref 8–23)
CO2: 28 mmol/L (ref 22–32)
Calcium: 9 mg/dL (ref 8.9–10.3)
Chloride: 108 mmol/L (ref 98–111)
Creatinine: 0.8 mg/dL (ref 0.44–1.00)
GFR, Estimated: >60 mL/min (ref >60)
Glucose, Bld: 91 mg/dL (ref 70–99)
Potassium: 3.9 mmol/L (ref 3.5–5.1)
Sodium: 141 mmol/L (ref 135–145)
Total Bilirubin: 0.4 mg/dL (ref 0.3–1.2)
Total Protein: 6.8 g/dL (ref 6.5–8.1)

## 2023-02-08 MED ORDER — SODIUM CHLORIDE 0.9 % IV SOLN
10.0000 mg | Freq: Once | INTRAVENOUS | Status: AC
  Administered 2023-02-08: 10 mg via INTRAVENOUS
  Filled 2023-02-08: qty 10

## 2023-02-08 MED ORDER — PALONOSETRON HCL INJECTION 0.25 MG/5ML
0.2500 mg | Freq: Once | INTRAVENOUS | Status: AC
  Administered 2023-02-08: 0.25 mg via INTRAVENOUS
  Filled 2023-02-08: qty 5

## 2023-02-08 MED ORDER — SODIUM CHLORIDE 0.9 % IV SOLN
500.0000 mg/m2 | Freq: Once | INTRAVENOUS | Status: AC
  Administered 2023-02-08: 1000 mg via INTRAVENOUS
  Filled 2023-02-08: qty 50

## 2023-02-08 MED ORDER — SODIUM CHLORIDE 0.9% FLUSH
10.0000 mL | INTRAVENOUS | Status: DC | PRN
  Administered 2023-02-08: 10 mL

## 2023-02-08 MED ORDER — SODIUM CHLORIDE 0.9 % IV SOLN
50.0000 mg/m2 | Freq: Once | INTRAVENOUS | Status: AC
  Administered 2023-02-08: 104 mg via INTRAVENOUS
  Filled 2023-02-08: qty 10.4

## 2023-02-08 MED ORDER — SODIUM CHLORIDE 0.9 % IV SOLN: Freq: Once | INTRAVENOUS | Status: AC

## 2023-02-08 MED ORDER — SODIUM CHLORIDE 0.9 % IV SOLN
150.0000 mg | Freq: Once | INTRAVENOUS | Status: AC
  Administered 2023-02-08: 150 mg via INTRAVENOUS
  Filled 2023-02-08: qty 150

## 2023-02-08 MED ORDER — HEPARIN SOD (PORK) LOCK FLUSH 100 UNIT/ML IV SOLN
500.0000 [IU] | Freq: Once | INTRAVENOUS | Status: AC | PRN
  Administered 2023-02-08: 500 [IU]

## 2023-02-08 NOTE — Progress Notes (Signed)
Per MD okay to proceed with tx today through port  a cath. MD requesting to r/u DVT right side of pt neck.  Order needed to be changed to VAS [place] upper extremity. Verbal order received by MD, appt scheduled and pt verbalized understanding of appt details.

## 2023-02-08 NOTE — Patient Instructions (Signed)
[facility]  Discharge Instructions: Thank you for choosing [facility] to provide your oncology and hematology care.   If you have a lab appointment with the Cancer Center, please go directly to the Cancer Center and check in at the registration area.   Wear comfortable clothing and clothing appropriate for easy access to any Portacath or PICC line.   We strive to give you quality time with your provider. You may need to reschedule your appointment if you arrive late (15 or more minutes).  Arriving late affects you and other patients whose appointments are after yours.  Also, if you miss three or more appointments without notifying the office, you may be dismissed from the clinic at the provider's discretion.      For prescription refill requests, have your pharmacy contact our office and allow 72 hours for refills to be completed.    Today you received the following chemotherapy and/or immunotherapy agents: docetaxel cytoxan      To help prevent nausea and vomiting after your treatment, we encourage you to take your nausea medication as directed.  BELOW ARE SYMPTOMS THAT SHOULD BE REPORTED IMMEDIATELY: *FEVER GREATER THAN 100.4 F (38 C) OR HIGHER *CHILLS OR SWEATING *NAUSEA AND VOMITING THAT IS NOT CONTROLLED WITH YOUR NAUSEA MEDICATION *UNUSUAL SHORTNESS OF BREATH *UNUSUAL BRUISING OR BLEEDING *URINARY PROBLEMS (pain or burning when urinating, or frequent urination) *BOWEL PROBLEMS (unusual diarrhea, constipation, pain near the anus) TENDERNESS IN MOUTH AND THROAT WITH OR WITHOUT PRESENCE OF ULCERS (sore throat, sores in mouth, or a toothache) UNUSUAL RASH, SWELLING OR PAIN  UNUSUAL VAGINAL DISCHARGE OR ITCHING   Items with * indicate a potential emergency and should be followed up as soon as possible or go to the Emergency Department if any problems should occur.  Please show the CHEMOTHERAPY ALERT CARD or IMMUNOTHERAPY ALERT CARD  at check-in to the Emergency Department and triage nurse.  Should you have questions after your visit or need to cancel or reschedule your appointment, please contact [facility]  Dept: [phone]  and follow the prompts.  Office hours are 8:00 a.m. to 4:30 p.m. Monday - Friday. Please note that voicemails left after 4:00 p.m. may not be returned until the following business day.  We are closed weekends and major holidays. You have access to a nurse at all times for urgent questions. Please call the main number to the clinic Dept: [phone] and follow the prompts.   For any non-urgent questions, you may also contact your provider using MyChart. We now offer e-Visits for anyone 18 and older to request care online for non-urgent symptoms. For details visit [email or web address].   Also download the MyChart app! Go to the app store, search "MyChart", open the app, select [facility], and log in with your MyChart username and password.  Docetaxel Injection What is this medication? DOCETAXEL (doe se TAX el) treats some types of cancer. It works by slowing down the growth of cancer cells. This medicine may be used for other purposes; ask your health care provider or pharmacist if you have questions. COMMON BRAND NAME(S): Docefrez, Taxotere What should I tell my care team before I take this medication? They need to know if you have any of these conditions: Kidney disease Liver disease Low white blood cell levels Tingling of the fingers or toes or other nerve disorder An unusual or allergic reaction to docetaxel, polysorbate 80, other medications, foods, dyes,   or preservatives Pregnant or trying to get pregnant Breast-feeding How should I use this medication? This medication is injected into a vein. It is given by your care team in a hospital or clinic setting. Talk to your care team about the use of this medication in children. Special care may be  needed. Overdosage: If you think you have taken too much of this medicine contact a poison control center or emergency room at once. NOTE: This medicine is only for you. Do not share this medicine with others. What if I miss a dose? Keep appointments for follow-up doses. It is important not to miss your dose. Call your care team if you are unable to keep an appointment. What may interact with this medication? Do not take this medication with any of the following: Live virus vaccines This medication may also interact with the following: Certain antibiotics, such as clarithromycin, telithromycin Certain antivirals for HIV or hepatitis Certain medications for fungal infections, such as itraconazole, ketoconazole, voriconazole Grapefruit juice Nefazodone Supplements, such as St. John's wort This list may not describe all possible interactions. Give your health care provider a list of all the medicines, herbs, non-prescription drugs, or dietary supplements you use. Also tell them if you smoke, drink alcohol, or use illegal drugs. Some items may interact with your medicine. What should I watch for while using this medication? This medication may make you feel generally unwell. This is not uncommon as chemotherapy can affect healthy cells as well as cancer cells. Report any side effects. Continue your course of treatment even though you feel ill unless your care team tells you to stop. You may need blood work done while you are taking this medication. This medication can cause serious side effects and infusion reactions. To reduce the risk, your care team may give you other medications to take before receiving this one. Be sure to follow the directions from your care team. This medication may increase your risk of getting an infection. Call your care team for advice if you get a fever, chills, sore throat, or other symptoms of a cold or flu. Do not treat yourself. Try to avoid being around people who  are sick. Avoid taking medications that contain aspirin, acetaminophen, ibuprofen, naproxen, or ketoprofen unless instructed by your care team. These medications may hide a fever. Be careful brushing or flossing your teeth or using a toothpick because you may get an infection or bleed more easily. If you have any dental work done, tell your dentist you are receiving this medication. Some products may contain alcohol. Ask your care team if this medication contains alcohol. Be sure to tell all care teams you are taking this medicine. Certain medications, like metronidazole and disulfiram, can cause an unpleasant reaction when taken with alcohol. The reaction includes flushing, headache, nausea, vomiting, sweating, and increased thirst. The reaction can last from 30 minutes to several hours. This medication may affect your coordination, reaction time, or judgement. Do not drive or operate machinery until you know how this medication affects you. Sit up or stand slowly to reduce the risk of dizzy or fainting spells. Drinking alcohol with this medication can increase the risk of these side effects. Talk to your care team about your risk of cancer. You may be more at risk for certain types of cancer if you take this medication. Talk to your care team if you wish to become pregnant or think you might be pregnant. This medication can cause serious birth defects if taken during   pregnancy or if you get pregnant within 2 months after stopping therapy. A negative pregnancy test is required before starting this medication. A reliable form of contraception is recommended while taking this medication and for 2 months after stopping it. Talk to your care team about reliable forms of contraception. Do not breast-feed while taking this medication and for 1 week after stopping therapy. Use a condom during sex and for 4 months after stopping therapy. Tell your care team right away if you think your partner might be pregnant.  This medication can cause serious birth defects. This medication may cause infertility. Talk to your care team if you are concerned about your fertility. What side effects may I notice from receiving this medication? Side effects that you should report to your care team as soon as possible: Allergic reactions--skin rash, itching, hives, swelling of the face, lips, tongue, or throat Change in vision such as blurry vision, seeing halos around lights, vision loss Infection--fever, chills, cough, or sore throat Infusion reactions--chest pain, shortness of breath or trouble breathing, feeling faint or lightheaded Low red blood cell level--unusual weakness or fatigue, dizziness, headache, trouble breathing Pain, tingling, or numbness in the hands or feet Painful swelling, warmth, or redness of the skin, blisters or sores at the infusion site Redness, blistering, peeling, or loosening of the skin, including inside the mouth Sudden or severe stomach pain, bloody diarrhea, fever, nausea, vomiting Swelling of the ankles, hands, or feet Tumor lysis syndrome (TLS)--nausea, vomiting, diarrhea, decrease in the amount of urine, dark urine, unusual weakness or fatigue, confusion, muscle pain or cramps, fast or irregular heartbeat, joint pain Unusual bruising or bleeding Side effects that usually do not require medical attention (report to your care team if they continue or are bothersome): Change in nail shape, thickness, or color Change in taste Hair loss Increased tears This list may not describe all possible side effects. Call your doctor for medical advice about side effects. You may report side effects to FDA at 1-800-FDA-1088. Where should I keep my medication? This medication is given in a hospital or clinic. It will not be stored at home. NOTE: This sheet is a summary. It may not cover all possible information. If you have questions about this medicine, talk to your doctor, pharmacist, or health care  provider.  2023 Elsevier/Gold Standard (2007-11-17 00:00:00)  Cyclophosphamide Injection What is this medication? CYCLOPHOSPHAMIDE (sye kloe FOSS fa mide) treats some types of cancer. It works by slowing down the growth of cancer cells. This medicine may be used for other purposes; ask your health care provider or pharmacist if you have questions. COMMON BRAND NAME(S): Cyclophosphamide, Cytoxan, Neosar What should I tell my care team before I take this medication? They need to know if you have any of these conditions: Heart disease Irregular heartbeat or rhythm Infection Kidney problems Liver disease Low blood cell levels (white cells, platelets, or red blood cells) Lung disease Previous radiation Trouble passing urine An unusual or allergic reaction to cyclophosphamide, other medications, foods, dyes, or preservatives Pregnant or trying to get pregnant Breast-feeding How should I use this medication? This medication is injected into a vein. It is given by your care team in a hospital or clinic setting. Talk to your care team about the use of this medication in children. Special care may be needed. Overdosage: If you think you have taken too much of this medicine contact a poison control center or emergency room at once. NOTE: This medicine is only for you. Do   not share this medicine with others. What if I miss a dose? Keep appointments for follow-up doses. It is important not to miss your dose. Call your care team if you are unable to keep an appointment. What may interact with this medication? Amphotericin B Amiodarone Azathioprine Certain antivirals for HIV or hepatitis Certain medications for blood pressure, such as enalapril, lisinopril, quinapril Cyclosporine Diuretics Etanercept Indomethacin Medications that relax muscles Metronidazole Natalizumab Tamoxifen Warfarin This list may not describe all possible interactions. Give your health care provider a list of all  the medicines, herbs, non-prescription drugs, or dietary supplements you use. Also tell them if you smoke, drink alcohol, or use illegal drugs. Some items may interact with your medicine. What should I watch for while using this medication? This medication may make you feel generally unwell. This is not uncommon as chemotherapy can affect healthy cells as well as cancer cells. Report any side effects. Continue your course of treatment even though you feel ill unless your care team tells you to stop. You may need blood work while you are taking this medication. This medication may increase your risk of getting an infection. Call your care team for advice if you get a fever, chills, sore throat, or other symptoms of a cold or flu. Do not treat yourself. Try to avoid being around people who are sick. Avoid taking medications that contain aspirin, acetaminophen, ibuprofen, naproxen, or ketoprofen unless instructed by your care team. These medications may hide a fever. Be careful brushing or flossing your teeth or using a toothpick because you may get an infection or bleed more easily. If you have any dental work done, tell your dentist you are receiving this medication. Drink water or other fluids as directed. Urinate often, even at night. Some products may contain alcohol. Ask your care team if this medication contains alcohol. Be sure to tell all care teams you are taking this medicine. Certain medicines, like metronidazole and disulfiram, can cause an unpleasant reaction when taken with alcohol. The reaction includes flushing, headache, nausea, vomiting, sweating, and increased thirst. The reaction can last from 30 minutes to several hours. Talk to your care team if you wish to become pregnant or think you might be pregnant. This medication can cause serious birth defects if taken during pregnancy and for 1 year after the last dose. A negative pregnancy test is required before starting this medication. A  reliable form of contraception is recommended while taking this medication and for 1 year after the last dose. Talk to your care team about reliable forms of contraception. Do not father a child while taking this medication and for 4 months after the last dose. Use a condom during this time period. Do not breast-feed while taking this medication or for 1 week after the last dose. This medication may cause infertility. Talk to your care team if you are concerned about your fertility. Talk to your care team about your risk of cancer. You may be more at risk for certain types of cancer if you take this medication. What side effects may I notice from receiving this medication? Side effects that you should report to your care team as soon as possible: Allergic reactions--skin rash, itching, hives, swelling of the face, lips, tongue, or throat Dry cough, shortness of breath or trouble breathing Heart failure--shortness of breath, swelling of the ankles, feet, or hands, sudden weight gain, unusual weakness or fatigue Heart muscle inflammation--unusual weakness or fatigue, shortness of breath, chest pain, fast or irregular   heartbeat, dizziness, swelling of the ankles, feet, or hands Heart rhythm changes--fast or irregular heartbeat, dizziness, feeling faint or lightheaded, chest pain, trouble breathing Infection--fever, chills, cough, sore throat, wounds that don't heal, pain or trouble when passing urine, general feeling of discomfort or being unwell Kidney injury--decrease in the amount of urine, swelling of the ankles, hands, or feet Liver injury--right upper belly pain, loss of appetite, nausea, light-colored stool, dark yellow or brown urine, yellowing skin or eyes, unusual weakness or fatigue Low red blood cell level--unusual weakness or fatigue, dizziness, headache, trouble breathing Low sodium level--muscle weakness, fatigue, dizziness, headache, confusion Red or dark brown urine Unusual bruising or  bleeding Side effects that usually do not require medical attention (report to your care team if they continue or are bothersome): Hair loss Irregular menstrual cycles or spotting Loss of appetite Nausea Pain, redness, or swelling with sores inside the mouth or throat Vomiting This list may not describe all possible side effects. Call your doctor for medical advice about side effects. You may report side effects to FDA at 1-800-FDA-1088. Where should I keep my medication? This medication is given in a hospital or clinic. It will not be stored at home. NOTE: This sheet is a summary. It may not cover all possible information. If you have questions about this medicine, talk to your doctor, pharmacist, or health care provider.  2023 Elsevier/Gold Standard (2021-11-16 00:00:00)   

## 2023-02-08 NOTE — Progress Notes (Signed)
Patient Care Team: [name], NP as PCP - General (Internal Medicine) Marykay Lex, MD as PCP - Cardiology (Cardiology) Emelia Loron, MD as Consulting Physician (General Surgery) Lonie Peak, MD as Attending Physician (Radiation Oncology) Armbruster, Willaim Rayas, MD as Consulting Physician (Gastroenterology) Pershing Proud, RN as Oncology Nurse Navigator Donnelly Angelica, RN as Oncology Nurse Navigator [name], MD as Consulting Physician (Hematology and Oncology)  DIAGNOSIS:  Encounter Diagnoses  Name Primary?   Malignant neoplasm of upper-outer quadrant of right breast in female, estrogen receptor negative (HCC) Yes   Malignant neoplasm of both breasts in female, estrogen receptor negative, unspecified site of breast (HCC)     SUMMARY OF ONCOLOGIC HISTORY: Oncology History  Malignant neoplasm of upper-outer quadrant of right breast in female, estrogen receptor negative (HCC)  10/24/2014 Mammogram   Right breast: ill-defined mass in the UOQ   10/24/2014 Breast US   Right breast: irregular hypoechoic taller than wide mass in the 11 o'clock location, 4 cm from the nipple, measuring 0.9 x 1.1 x 1.2 cm. Evaluation of the right axilla is negative for adenopathy.   11/12/2014 Initial Biopsy   Right breast needle core bx: Invasive ductal carcinoma, ER- (0%), PR- (0%), HER2/neu negative, Ki67 19%, grade 2-3.   11/20/2014 Procedure   Genetic testing: BreastNext panel [employer]) reveals no clinically significant variant at ATM, BARD1, BRCA1, BRCA2, BRIP1, CDH1, CHEK2, MRE11A, MUTYH, NBN, NF1, PALB2, PTEN, RAD50, RAD51C, RAD51D, and TP53.   11/21/2014 Breast MRI   Right breast mass measuring up to 1.3 cm without lymphadenopathy or findings to suggest multifocal or multicentric disease. Benign circumscribed oval enhancing mass located posterior medial to the malignancy consistent with fibroadenoma   11/24/2014 Clinical Stage   Stage IA: T1c N0   12/02/2014 - 02/03/2015  Neo-Adjuvant Chemotherapy   Cyclophosphamide and docetaxel x 4 cycles   02/09/2015 Breast MRI   Right breast mass measures 1.0 x 0.7 x 0.5 cm, decreased from 1.4 x 1.4 x 1.0 cm. No additional areas of suspicion in right or left breast.   03/02/2015 Definitive Surgery   Right lumpectomy / SLNB Dwain Sarna): IDC, grade 1, with high grade DCIS spanning 1 cmHER2/neu repeated and remains negative (ratio 1.62). 4 LN removed and negative for malignancy (0/4 LN).   03/02/2015 Pathologic Stage   Stage IA: ypT1b ypN0   04/20/2015 - 05/29/2015 Radiation Therapy   Adjuvant RT Basilio Cairo): Right Breast  50 Gy over 25 fractions. Right Breast boost 10 Gy over 5 fractions. Total dose: 60 Gy.   07/28/2015 Survivorship   Survivorship visit completed and copy of care plan provided to patient.   12/15/2022 Surgery   Left lumpectomy: Grade 3 IDC 2.2 cm, margins negative, ER 0%, PR 0%, HER2 negative, Ki-67 95%, 0/2 lymph nodes negative Right mastectomy: Grade 3 IDC 6.6 cm with DCIS, margins negative, ER 2%, PR 0%, HER2 negative, Ki-67 50%   12/28/2022 Cancer Staging   Staging form: Breast, AJCC 7th Edition - Pathologic: Stage IIB (T3, N0, cM0) - Signed by [name], MD on 12/28/2022 Stage prefix: Initial diagnosis Laterality: Bilateral Histologic grade (G): G3 Paget's disease: Negative Estrogen receptor status: Negative Progesterone receptor status: Negative   Bilateral malignant neoplasm of breast in female [facility])  11/02/2022 Initial Diagnosis   Bilateral malignant neoplasm of breast in female [facility])   01/18/2023 -  Chemotherapy   Patient is on Treatment Plan : BREAST TC q21d       CHIEF COMPLIANT: Cycle 2 Taxotere Cytoxan   INTERVAL  HISTORY: Katie Jacobson is a 66 y.r old with  triple negative breast cancer.  She presents to the clinic for a follow-up. She reports that she is having some swelling around the mouth. She states that she is eating. She eats a little bit at a time. She is extremely anxious  about proceeding with her treatment given her previous cycle experience.  At the same time she wants to continue on and get finished as soon as possible.  ALLERGIES:  is allergic to sulfa antibiotics.  MEDICATIONS:  Current Outpatient Medications  Medication Sig Dispense Refill   albuterol (VENTOLIN HFA) 108 (90 Base) MCG/ACT inhaler Inhale 1-2 puffs into the lungs every 6 (six) hours as needed for wheezing or shortness of breath. 1 each 0   aspirin 81 MG tablet Take 1 tablet (81 mg total) by mouth daily. 30 tablet 0   chlorthalidone (HYGROTON) 25 MG tablet TAKE 1 TABLET(25 MG) BY MOUTH EVERY OTHER DAY (Patient taking differently: Take 25 mg by mouth every Monday, Wednesday, and Friday.) 45 tablet 1   enalapril (VASOTEC) 20 MG tablet TAKE 1 TABLET(20 MG) BY MOUTH AT BEDTIME (Patient taking differently: Take 20 mg by mouth at bedtime.) 90 tablet 1   fluticasone (FLONASE) 50 MCG/ACT nasal spray Place 2 sprays into both nostrils as needed. (Patient taking differently: Place 2 sprays into both nostrils daily as needed for allergies or rhinitis.) 16 g 0   lidocaine-prilocaine (EMLA) cream Apply to affected area once (Patient taking differently: Apply 1 Application topically once as needed [facility]).) 30 g 3   metoprolol succinate (TOPROL-XL) 100 MG 24 hr tablet Take 1 tablet (100 mg total) by mouth daily. Take with or immediately following a meal. (Patient taking differently: Take 100 mg by mouth at bedtime. Take with or immediately following a meal.) 90 tablet 3   omeprazole (PRILOSEC) 40 MG capsule Take 1 capsule (40 mg total) by mouth daily. 30 capsule 2   ondansetron (ZOFRAN-ODT) 4 MG disintegrating tablet Take 1 tablet (4 mg total) by mouth every 8 (eight) hours as needed for up to 15 doses for nausea or vomiting. 15 tablet 0   triamcinolone ointment (KENALOG) 0.5 % Apply 1 Application topically 2 (two) times daily. 30 g 0   promethazine (PHENERGAN) 25 MG suppository Place 1 suppository (25 mg total)  rectally every 6 (six) hours as needed for up to 12 days for nausea or vomiting. 12 each 0   No current facility-administered medications for this visit.   Facility-Administered Medications Ordered in Other Visits  Medication Dose Route Frequency Provider Last Rate Last Admin   sodium chloride flush (NS) 0.9 % injection 10 mL  10 mL Intracatheter PRN [name], MD   10 mL at 02/08/23 1106    PHYSICAL EXAMINATION: ECOG PERFORMANCE STATUS: 1 - Symptomatic but completely ambulatory  Vitals:   02/08/23 1122  BP: (!) 153/66  Pulse: 89  Resp: 18  Temp: (!) 97.3 F (36.3 C)  SpO2: 100%   Filed Weights   02/08/23 1122  Weight: 213 lb 12.8 oz (97 kg)      LABORATORY DATA:  I have reviewed the data as listed    Latest Ref Rng & Units 01/31/2023   10:47 AM 01/29/2023    5:00 AM 01/28/2023    3:20 AM  CMP  Glucose 70 - 99 mg/dL 88  80  91   BUN 8 - 23 mg/dL 5  <5  5   Creatinine 9.60 - 1.00 mg/dL 4.54  0.89  0.88   Sodium 135 - 145 mmol/L 141  137  136   Potassium 3.5 - 5.1 mmol/L 3.8  3.5  3.6   Chloride 98 - 111 mmol/L 108  107  104   CO2 22 - 32 mmol/L 27  26  26    Calcium 8.9 - 10.3 mg/dL 8.8  7.7  8.2   Total Protein 6.5 - 8.1 g/dL 6.0  4.4    Total Bilirubin 0.3 - 1.2 mg/dL 0.3  0.4    Alkaline Phos 38 - 126 U/L 58  73    AST 15 - 41 U/L 17  17    ALT 0 - 44 U/L 20  16      Lab Results  Component Value Date   WBC 6.2 02/08/2023   HGB 11.7 (L) 02/08/2023   HCT 35.0 (L) 02/08/2023   MCV 94.6 02/08/2023   PLT 334 02/08/2023   NEUTROABS 4.0 02/08/2023    ASSESSMENT & PLAN:  Malignant neoplasm of upper-outer quadrant of right breast in female, estrogen receptor negative (HCC) 11/12/2014: Right breast biopsy T1 cN0 stage Ia triple negative IDC Ki-67 19% 11/12/2014-02/03/2015: TC x 4 neoadjuvant chemotherapy, genetics negative 03/02/2015: Right lumpectomy: Grade 1 IDC negative margins triple negative August 2016: Completed radiation   November 2023: Asymmetry left  breast and a possible mass (0.8 cm) and asymmetry in the right breast (1.7 cm and 1 cm) Right breast biopsy: 12:00: Grade 2 IDC ER 2%, PR 0%, Ki-67 50%, HER2 negative 2+ by IHC, 1230: Grade 2 IDC   left breast biopsy 1:00: Grade 2 IDC, ER 0%, PR 0%, Ki-67 95%, HER2 2+ , FISH: Negative   CT CAP and bone scan: 10/24/2022: No evidence of metastatic disease.   Treatment plan: 12/15/2022:Left lumpectomy: Grade 3 IDC 2.2 cm, margins negative, ER 0%, PR 0%, HER2 negative, Ki-67 95%, 0/2 lymph nodes negative Right mastectomy: Grade 3 IDC 6.6 cm with DCIS, margins negative, ER 2%, PR 0%, HER2 negative, Ki-67 50% Adjuvant chemotherapy: With Taxotere and Cytoxan every 3 weeks x 4 ----------------------------------------------------------------------------------------------------------------------------------- Current treatment: Taxotere Cytoxan cycle 2 Chemo toxicities: Profound nausea and vomiting: Hospitalization 01/26/2023-01/29/2023: Reduce the dosage of Taxotere and Cytoxan for today.  I added Emend to her regimen. Generalized fatigue and weakness  I offered the patient IV fluids and antiemetics on a weekly basis but she wants to see how it goes before she makes those appointments.  Swelling of the right side of the neck: We will obtain ultrasound to rule out DVT.  Orders Placed This Encounter  Procedures   US SOFT TISSUE HEAD & NECK (NON-THYROID)    Standing Status:   Future    Standing Expiration Date:   02/08/2024    Order Specific Question:   Reason for exam:    Answer:   having some swelling    Order Specific Question:   Preferred imaging location?    Answer:   [facility]   The patient has a good understanding of the overall plan. she agrees with it. she will call with any problems that may develop before the next visit here. Total time spent: 30 mins including face to face time and time spent for planning, charting and co-ordination of care   Tamsen Meek,  MD 02/08/23    I Janan Ridge am acting as a Neurosurgeon for [employer]  I have reviewed the above documentation for accuracy and completeness, and I agree with the above.

## 2023-02-08 NOTE — Assessment & Plan Note (Signed)
11/12/2014: Right breast biopsy T1 cN0 stage Ia triple negative IDC Ki-67 19% 11/12/2014-02/03/2015: TC x 4 neoadjuvant chemotherapy, genetics negative 03/02/2015: Right lumpectomy: Grade 1 IDC negative margins triple negative August 2016: Completed radiation   November 2023: Asymmetry left breast and a possible mass (0.8 cm) and asymmetry in the right breast (1.7 cm and 1 cm) Right breast biopsy: 12:00: Grade 2 IDC ER 2%, PR 0%, Ki-67 50%, HER2 negative 2+ by IHC, 1230: Grade 2 IDC   left breast biopsy 1:00: Grade 2 IDC, ER 0%, PR 0%, Ki-67 95%, HER2 2+ , FISH: Negative   CT CAP and bone scan: 10/24/2022: No evidence of metastatic disease.   Treatment plan: 12/15/2022:Left lumpectomy: Grade 3 IDC 2.2 cm, margins negative, ER 0%, PR 0%, HER2 negative, Ki-67 95%, 0/2 lymph nodes negative Right mastectomy: Grade 3 IDC 6.6 cm with DCIS, margins negative, ER 2%, PR 0%, HER2 negative, Ki-67 50% Adjuvant chemotherapy: With Taxotere and Cytoxan every 3 weeks x 4 ----------------------------------------------------------------------------------------------------------------------------------- Current treatment: Taxotere Cytoxan cycle 2 Chemo toxicities: Profound nausea and vomiting: Hospitalization 01/26/2023-01/29/2023 Generalized fatigue and weakness  We discussed the treatment plan including significant dose reduction and bringing her in for fluids and antiemetics on a weekly basis

## 2023-02-09 ENCOUNTER — Ambulatory Visit (HOSPITAL_COMMUNITY)
Admission: RE | Admit: 2023-02-09 | Discharge: 2023-02-09 | Disposition: A | Discharge status: Home / Self Care | Payer: 59 | Source: Ambulatory Visit | Attending: Hematology and Oncology | Admitting: Hematology and Oncology

## 2023-02-09 DIAGNOSIS — C50912 Malignant neoplasm of unspecified site of left female breast: Secondary | ICD-10-CM | POA: Diagnosis not present

## 2023-02-09 DIAGNOSIS — R221 Localized swelling, mass and lump, neck: Secondary | ICD-10-CM | POA: Diagnosis not present

## 2023-02-09 DIAGNOSIS — C50911 Malignant neoplasm of unspecified site of right female breast: Secondary | ICD-10-CM | POA: Insufficient documentation

## 2023-02-09 DIAGNOSIS — Z171 Estrogen receptor negative status [ER-]: Secondary | ICD-10-CM | POA: Diagnosis not present

## 2023-02-09 NOTE — Progress Notes (Signed)
Right upper extremity venous duplex has been completed. Preliminary results can be found in CV Proc through chart review.  Results were given to [facility] at Dr. Earmon [place] office.  02/09/23 1:38 PM Olen Cordial RVT

## 2023-02-10 ENCOUNTER — Inpatient Hospital Stay: Payer: 59

## 2023-02-10 ENCOUNTER — Other Ambulatory Visit: Payer: Self-pay

## 2023-02-10 VITALS — BP 120/69 | HR 77 | Temp 98.6°F | Resp 16

## 2023-02-10 DIAGNOSIS — R531 Weakness: Secondary | ICD-10-CM | POA: Diagnosis not present

## 2023-02-10 DIAGNOSIS — N6489 Other specified disorders of breast: Secondary | ICD-10-CM | POA: Diagnosis not present

## 2023-02-10 DIAGNOSIS — C50411 Malignant neoplasm of upper-outer quadrant of right female breast: Secondary | ICD-10-CM | POA: Diagnosis not present

## 2023-02-10 DIAGNOSIS — C50911 Malignant neoplasm of unspecified site of right female breast: Secondary | ICD-10-CM

## 2023-02-10 DIAGNOSIS — Z882 Allergy status to sulfonamides status: Secondary | ICD-10-CM | POA: Diagnosis not present

## 2023-02-10 DIAGNOSIS — Z5189 Encounter for other specified aftercare: Secondary | ICD-10-CM | POA: Diagnosis not present

## 2023-02-10 DIAGNOSIS — Z79630 Long term (current) use of alkylating agent: Secondary | ICD-10-CM | POA: Diagnosis not present

## 2023-02-10 DIAGNOSIS — R112 Nausea with vomiting, unspecified: Secondary | ICD-10-CM | POA: Diagnosis not present

## 2023-02-10 DIAGNOSIS — Z5111 Encounter for antineoplastic chemotherapy: Secondary | ICD-10-CM | POA: Diagnosis not present

## 2023-02-10 DIAGNOSIS — Z171 Estrogen receptor negative status [ER-]: Secondary | ICD-10-CM | POA: Diagnosis not present

## 2023-02-10 DIAGNOSIS — Z79899 Other long term (current) drug therapy: Secondary | ICD-10-CM | POA: Diagnosis not present

## 2023-02-10 DIAGNOSIS — Z79633 Long term (current) use of mitotic inhibitor: Secondary | ICD-10-CM | POA: Diagnosis not present

## 2023-02-10 DIAGNOSIS — Z923 Personal history of irradiation: Secondary | ICD-10-CM | POA: Diagnosis not present

## 2023-02-10 DIAGNOSIS — R5383 Other fatigue: Secondary | ICD-10-CM | POA: Diagnosis not present

## 2023-02-10 MED ORDER — PEGFILGRASTIM-JMDB 6 MG/0.6ML ~~LOC~~ SOSY
6.0000 mg | PREFILLED_SYRINGE | Freq: Once | SUBCUTANEOUS | Status: AC
  Administered 2023-02-10: 6 mg via SUBCUTANEOUS
  Filled 2023-02-10: qty 0.6

## 2023-02-14 ENCOUNTER — Encounter (HOSPITAL_COMMUNITY): Payer: Self-pay | Admitting: General Surgery

## 2023-02-17 ENCOUNTER — Encounter: Payer: Self-pay | Admitting: *Deleted

## 2023-02-27 ENCOUNTER — Other Ambulatory Visit: Payer: Self-pay | Admitting: General Surgery

## 2023-02-28 MED FILL — Dexamethasone Sodium Phosphate Inj 100 MG/10ML: INTRAMUSCULAR | Qty: 1 | Status: AC

## 2023-02-28 MED FILL — Fosaprepitant Dimeglumine For IV Infusion 150 MG (Base Eq): INTRAVENOUS | Qty: 5 | Status: AC

## 2023-02-28 NOTE — Progress Notes (Signed)
Patient Care Team: [name], NP as PCP - General (Internal Medicine) Marykay Lex, MD as PCP - Cardiology (Cardiology) Emelia Loron, MD as Consulting Physician (General Surgery) Lonie Peak, MD as Attending Physician (Radiation Oncology) Armbruster, Willaim Rayas, MD as Consulting Physician (Gastroenterology) Pershing Proud, RN as Oncology Nurse Navigator Donnelly Angelica, RN as Oncology Nurse Navigator [name], MD as Consulting Physician (Hematology and Oncology)  DIAGNOSIS: No diagnosis found.  SUMMARY OF ONCOLOGIC HISTORY: Oncology History  Malignant neoplasm of upper-outer quadrant of right breast in female, estrogen receptor negative (HCC)  10/24/2014 Mammogram   Right breast: ill-defined mass in the UOQ   10/24/2014 Breast US   Right breast: irregular hypoechoic taller than wide mass in the 11 o'clock location, 4 cm from the nipple, measuring 0.9 x 1.1 x 1.2 cm. Evaluation of the right axilla is negative for adenopathy.   11/12/2014 Initial Biopsy   Right breast needle core bx: Invasive ductal carcinoma, ER- (0%), PR- (0%), HER2/neu negative, Ki67 19%, grade 2-3.   11/20/2014 Procedure   Genetic testing: BreastNext panel [employer]) reveals no clinically significant variant at ATM, BARD1, BRCA1, BRCA2, BRIP1, CDH1, CHEK2, MRE11A, MUTYH, NBN, NF1, PALB2, PTEN, RAD50, RAD51C, RAD51D, and TP53.   11/21/2014 Breast MRI   Right breast mass measuring up to 1.3 cm without lymphadenopathy or findings to suggest multifocal or multicentric disease. Benign circumscribed oval enhancing mass located posterior medial to the malignancy consistent with fibroadenoma   11/24/2014 Clinical Stage   Stage IA: T1c N0   12/02/2014 - 02/03/2015 Neo-Adjuvant Chemotherapy   Cyclophosphamide and docetaxel x 4 cycles   02/09/2015 Breast MRI   Right breast mass measures 1.0 x 0.7 x 0.5 cm, decreased from 1.4 x 1.4 x 1.0 cm. No additional areas of suspicion in right or left breast.    03/02/2015 Definitive Surgery   Right lumpectomy / SLNB Dwain Sarna): IDC, grade 1, with high grade DCIS spanning 1 cmHER2/neu repeated and remains negative (ratio 1.62). 4 LN removed and negative for malignancy (0/4 LN).   03/02/2015 Pathologic Stage   Stage IA: ypT1b ypN0   04/20/2015 - 05/29/2015 Radiation Therapy   Adjuvant RT Basilio Cairo): Right Breast  50 Gy over 25 fractions. Right Breast boost 10 Gy over 5 fractions. Total dose: 60 Gy.   07/28/2015 Survivorship   Survivorship visit completed and copy of care plan provided to patient.   12/15/2022 Surgery   Left lumpectomy: Grade 3 IDC 2.2 cm, margins negative, ER 0%, PR 0%, HER2 negative, Ki-67 95%, 0/2 lymph nodes negative Right mastectomy: Grade 3 IDC 6.6 cm with DCIS, margins negative, ER 2%, PR 0%, HER2 negative, Ki-67 50%   12/28/2022 Cancer Staging   Staging form: Breast, AJCC 7th Edition - Pathologic: Stage IIB (T3, N0, cM0) - Signed by [name], MD on 12/28/2022 Stage prefix: Initial diagnosis Laterality: Bilateral Histologic grade (G): G3 Paget's disease: Negative Estrogen receptor status: Negative Progesterone receptor status: Negative   Bilateral malignant neoplasm of breast in female [facility])  11/02/2022 Initial Diagnosis   Bilateral malignant neoplasm of breast in female [facility])   01/18/2023 -  Chemotherapy   Patient is on Treatment Plan : BREAST TC q21d       CHIEF COMPLIANT:  Cycle 3 Taxotere Cytoxan     INTERVAL HISTORY: Katie Jacobson is a 58 y.r old with  triple negative breast cancer.  She presents to the clinic for a follow-up.    ALLERGIES:  is allergic to sulfa antibiotics.  MEDICATIONS:  Current Outpatient Medications  Medication Sig Dispense Refill   albuterol (VENTOLIN HFA) 108 (90 Base) MCG/ACT inhaler Inhale 1-2 puffs into the lungs every 6 (six) hours as needed for wheezing or shortness of breath. 1 each 0   aspirin 81 MG tablet Take 1 tablet (81 mg total) by mouth daily. 30 tablet 0    chlorthalidone (HYGROTON) 25 MG tablet TAKE 1 TABLET(25 MG) BY MOUTH EVERY OTHER DAY (Patient taking differently: Take 25 mg by mouth every Monday, Wednesday, and Friday.) 45 tablet 1   enalapril (VASOTEC) 20 MG tablet TAKE 1 TABLET(20 MG) BY MOUTH AT BEDTIME (Patient taking differently: Take 20 mg by mouth at bedtime.) 90 tablet 1   fluticasone (FLONASE) 50 MCG/ACT nasal spray Place 2 sprays into both nostrils as needed. (Patient taking differently: Place 2 sprays into both nostrils daily as needed for allergies or rhinitis.) 16 g 0   lidocaine-prilocaine (EMLA) cream Apply to affected area once (Patient taking differently: Apply 1 Application topically once as needed [facility]).) 30 g 3   metoprolol succinate (TOPROL-XL) 100 MG 24 hr tablet Take 1 tablet (100 mg total) by mouth daily. Take with or immediately following a meal. (Patient taking differently: Take 100 mg by mouth at bedtime. Take with or immediately following a meal.) 90 tablet 3   omeprazole (PRILOSEC) 40 MG capsule Take 1 capsule (40 mg total) by mouth daily. 30 capsule 2   ondansetron (ZOFRAN-ODT) 4 MG disintegrating tablet Take 1 tablet (4 mg total) by mouth every 8 (eight) hours as needed for up to 15 doses for nausea or vomiting. 15 tablet 0   promethazine (PHENERGAN) 25 MG suppository Place 1 suppository (25 mg total) rectally every 6 (six) hours as needed for up to 12 days for nausea or vomiting. 12 each 0   triamcinolone ointment (KENALOG) 0.5 % Apply 1 Application topically 2 (two) times daily. 30 g 0   No current facility-administered medications for this visit.    PHYSICAL EXAMINATION: ECOG PERFORMANCE STATUS: {CHL ONC ECOG [phone]}  There were no vitals filed for this visit. There were no vitals filed for this visit.  BREAST:*** No palpable masses or nodules in either right or left breasts. No palpable axillary supraclavicular or infraclavicular adenopathy no breast tenderness or nipple discharge. (exam performed in  the presence of a chaperone)  LABORATORY DATA:  I have reviewed the data as listed    Latest Ref Rng & Units 02/08/2023   11:03 AM 01/31/2023   10:47 AM 01/29/2023    5:00 AM  CMP  Glucose 70 - 99 mg/dL 91  88  80   BUN 8 - 23 mg/dL 14  5  <5   Creatinine 0.44 - 1.00 mg/dL 7.82  9.56  2.13   Sodium 135 - 145 mmol/L 141  141  137   Potassium 3.5 - 5.1 mmol/L 3.9  3.8  3.5   Chloride 98 - 111 mmol/L 108  108  107   CO2 22 - 32 mmol/L 28  27  26    Calcium 8.9 - 10.3 mg/dL 9.0  8.8  7.7   Total Protein 6.5 - 8.1 g/dL 6.8  6.0  4.4   Total Bilirubin 0.3 - 1.2 mg/dL 0.4  0.3  0.4   Alkaline Phos 38 - 126 U/L 53  58  73   AST 15 - 41 U/L 14  17  17    ALT 0 - 44 U/L 16  20  16      Lab Results  Component Value Date   WBC 6.2 02/08/2023   HGB 11.7 (L) 02/08/2023   HCT 35.0 (L) 02/08/2023   MCV 94.6 02/08/2023   PLT 334 02/08/2023   NEUTROABS 4.0 02/08/2023    ASSESSMENT & PLAN:  No problem-specific Assessment & Plan notes found for this encounter.    No orders of the defined types were placed in this encounter.  The patient has a good understanding of the overall plan. she agrees with it. she will call with any problems that may develop before the next visit here. Total time spent: 30 mins including face to face time and time spent for planning, charting and co-ordination of care   Sherlyn Lick, CMA 02/28/23    I Janan Ridge am acting as a Neurosurgeon for [employer]  ***

## 2023-03-01 ENCOUNTER — Inpatient Hospital Stay: Payer: 59

## 2023-03-01 ENCOUNTER — Other Ambulatory Visit: Payer: Self-pay

## 2023-03-01 ENCOUNTER — Inpatient Hospital Stay: Payer: 59 | Admitting: Hematology and Oncology

## 2023-03-01 VITALS — BP 122/55 | HR 75

## 2023-03-01 VITALS — BP 140/64 | HR 84 | Temp 97.7°F | Resp 18 | Wt 213.1 lb

## 2023-03-01 DIAGNOSIS — C50411 Malignant neoplasm of upper-outer quadrant of right female breast: Secondary | ICD-10-CM | POA: Diagnosis not present

## 2023-03-01 DIAGNOSIS — Z79630 Long term (current) use of alkylating agent: Secondary | ICD-10-CM | POA: Diagnosis not present

## 2023-03-01 DIAGNOSIS — Z5111 Encounter for antineoplastic chemotherapy: Secondary | ICD-10-CM | POA: Diagnosis not present

## 2023-03-01 DIAGNOSIS — R112 Nausea with vomiting, unspecified: Secondary | ICD-10-CM | POA: Diagnosis not present

## 2023-03-01 DIAGNOSIS — Z882 Allergy status to sulfonamides status: Secondary | ICD-10-CM | POA: Diagnosis not present

## 2023-03-01 DIAGNOSIS — Z171 Estrogen receptor negative status [ER-]: Secondary | ICD-10-CM

## 2023-03-01 DIAGNOSIS — Z923 Personal history of irradiation: Secondary | ICD-10-CM | POA: Diagnosis not present

## 2023-03-01 DIAGNOSIS — Z5189 Encounter for other specified aftercare: Secondary | ICD-10-CM | POA: Diagnosis not present

## 2023-03-01 DIAGNOSIS — C50911 Malignant neoplasm of unspecified site of right female breast: Secondary | ICD-10-CM

## 2023-03-01 DIAGNOSIS — Z79899 Other long term (current) drug therapy: Secondary | ICD-10-CM | POA: Diagnosis not present

## 2023-03-01 DIAGNOSIS — R5383 Other fatigue: Secondary | ICD-10-CM | POA: Diagnosis not present

## 2023-03-01 DIAGNOSIS — Z79633 Long term (current) use of mitotic inhibitor: Secondary | ICD-10-CM | POA: Diagnosis not present

## 2023-03-01 DIAGNOSIS — C50912 Malignant neoplasm of unspecified site of left female breast: Secondary | ICD-10-CM

## 2023-03-01 DIAGNOSIS — N6489 Other specified disorders of breast: Secondary | ICD-10-CM | POA: Diagnosis not present

## 2023-03-01 DIAGNOSIS — Z95828 Presence of other vascular implants and grafts: Secondary | ICD-10-CM

## 2023-03-01 DIAGNOSIS — R531 Weakness: Secondary | ICD-10-CM | POA: Diagnosis not present

## 2023-03-01 LAB — CBC WITH DIFFERENTIAL (CANCER CENTER ONLY)
Abs Immature Granulocytes: 0.02 10*3/uL (ref 0.00–0.07)
Basophils Absolute: 0.1 10*3/uL (ref 0.0–0.1)
Basophils Relative: 1 %
Eosinophils Absolute: 0 10*3/uL (ref 0.0–0.5)
Eosinophils Relative: 0 %
HCT: 33.2 % — ABNORMAL LOW (ref 36.0–46.0)
Hemoglobin: 11.2 g/dL — ABNORMAL LOW (ref 12.0–15.0)
Immature Granulocytes: 0 %
Lymphocytes Relative: 21 %
Lymphs Abs: 1.3 10*3/uL (ref 0.7–4.0)
MCH: 31.5 pg (ref 26.0–34.0)
MCHC: 33.7 g/dL (ref 30.0–36.0)
MCV: 93.3 fL (ref 80.0–100.0)
Monocytes Absolute: 0.6 10*3/uL (ref 0.1–1.0)
Monocytes Relative: 10 %
Neutro Abs: 4.3 10*3/uL (ref 1.7–7.7)
Neutrophils Relative %: 68 %
Platelet Count: 252 10*3/uL (ref 150–400)
RBC: 3.56 MIL/uL — ABNORMAL LOW (ref 3.87–5.11)
RDW: 16.7 % — ABNORMAL HIGH (ref 11.5–15.5)
WBC Count: 6.3 10*3/uL (ref 4.0–10.5)
nRBC: 0 % (ref 0.0–0.2)

## 2023-03-01 LAB — CMP (CANCER CENTER ONLY)
ALT: 13 U/L (ref 0–44)
AST: 12 U/L — ABNORMAL LOW (ref 15–41)
Albumin: 3.6 g/dL (ref 3.5–5.0)
Alkaline Phosphatase: 59 U/L (ref 38–126)
Anion gap: 6 (ref 5–15)
BUN: 13 mg/dL (ref 8–23)
CO2: 27 mmol/L (ref 22–32)
Calcium: 8.8 mg/dL — ABNORMAL LOW (ref 8.9–10.3)
Chloride: 108 mmol/L (ref 98–111)
Creatinine: 0.81 mg/dL (ref 0.44–1.00)
GFR, Estimated: >60 mL/min (ref >60)
Glucose, Bld: 90 mg/dL (ref 70–99)
Potassium: 3.6 mmol/L (ref 3.5–5.1)
Sodium: 141 mmol/L (ref 135–145)
Total Bilirubin: 0.4 mg/dL (ref 0.3–1.2)
Total Protein: 6.4 g/dL — ABNORMAL LOW (ref 6.5–8.1)

## 2023-03-01 MED ORDER — PALONOSETRON HCL INJECTION 0.25 MG/5ML
0.2500 mg | Freq: Once | INTRAVENOUS | Status: AC
  Administered 2023-03-01: 0.25 mg via INTRAVENOUS
  Filled 2023-03-01: qty 5

## 2023-03-01 MED ORDER — SODIUM CHLORIDE 0.9% FLUSH
10.0000 mL | INTRAVENOUS | Status: DC | PRN
  Administered 2023-03-01: 10 mL

## 2023-03-01 MED ORDER — SODIUM CHLORIDE 0.9 % IV SOLN
50.0000 mg/m2 | Freq: Once | INTRAVENOUS | Status: AC
  Administered 2023-03-01: 104 mg via INTRAVENOUS
  Filled 2023-03-01: qty 10.4

## 2023-03-01 MED ORDER — SODIUM CHLORIDE 0.9 % IV SOLN: Freq: Once | INTRAVENOUS | Status: AC

## 2023-03-01 MED ORDER — HEPARIN SOD (PORK) LOCK FLUSH 100 UNIT/ML IV SOLN
500.0000 [IU] | Freq: Once | INTRAVENOUS | Status: AC | PRN
  Administered 2023-03-01: 500 [IU]

## 2023-03-01 MED ORDER — SODIUM CHLORIDE 0.9 % IV SOLN
500.0000 mg/m2 | Freq: Once | INTRAVENOUS | Status: AC
  Administered 2023-03-01: 1000 mg via INTRAVENOUS
  Filled 2023-03-01: qty 50

## 2023-03-01 MED ORDER — SODIUM CHLORIDE 0.9 % IV SOLN
10.0000 mg | Freq: Once | INTRAVENOUS | Status: AC
  Administered 2023-03-01: 10 mg via INTRAVENOUS
  Filled 2023-03-01: qty 10

## 2023-03-01 MED ORDER — SODIUM CHLORIDE 0.9 % IV SOLN
150.0000 mg | Freq: Once | INTRAVENOUS | Status: AC
  Administered 2023-03-01: 150 mg via INTRAVENOUS
  Filled 2023-03-01: qty 150

## 2023-03-01 NOTE — Assessment & Plan Note (Signed)
11/12/2014: Right breast biopsy T1 cN0 stage Ia triple negative IDC Ki-67 19% 11/12/2014-02/03/2015: TC x 4 neoadjuvant chemotherapy, genetics negative 03/02/2015: Right lumpectomy: Grade 1 IDC negative margins triple negative August 2016: Completed radiation   November 2023: Asymmetry left breast and a possible mass (0.8 cm) and asymmetry in the right breast (1.7 cm and 1 cm) Right breast biopsy: 12:00: Grade 2 IDC ER 2%, PR 0%, Ki-67 50%, HER2 negative 2+ by IHC, 1230: Grade 2 IDC   left breast biopsy 1:00: Grade 2 IDC, ER 0%, PR 0%, Ki-67 95%, HER2 2+ , FISH: Negative   CT CAP and bone scan: 10/24/2022: No evidence of metastatic disease.   Treatment plan: 12/15/2022:Left lumpectomy: Grade 3 IDC 2.2 cm, margins negative, ER 0%, PR 0%, HER2 negative, Ki-67 95%, 0/2 lymph nodes negative Right mastectomy: Grade 3 IDC 6.6 cm with DCIS, margins negative, ER 2%, PR 0%, HER2 negative, Ki-67 50% Adjuvant chemotherapy: With Taxotere and Cytoxan every 3 weeks x 4 ----------------------------------------------------------------------------------------------------------------------------------- Current treatment: Taxotere Cytoxan cycle 3 Chemo toxicities: Profound nausea and vomiting: Hospitalization 01/26/2023-01/29/2023: Reduce the dosage of Taxotere and Cytoxan for today.  I added Emend to her regimen. Generalized fatigue and weakness  I discussed with her if she would like to get fluids after each treatment Right neck swelling: Ultrasound negative for DVT

## 2023-03-01 NOTE — Patient Instructions (Signed)
[facility]  Discharge Instructions: Thank you for choosing [facility] to provide your oncology and hematology care.   If you have a lab appointment with the Cancer Center, please go directly to the Cancer Center and check in at the registration area.   Wear comfortable clothing and clothing appropriate for easy access to any Portacath or PICC line.   We strive to give you quality time with your provider. You may need to reschedule your appointment if you arrive late (15 or more minutes).  Arriving late affects you and other patients whose appointments are after yours.  Also, if you miss three or more appointments without notifying the office, you may be dismissed from the clinic at the provider's discretion.      For prescription refill requests, have your pharmacy contact our office and allow 72 hours for refills to be completed.    Today you received the following chemotherapy and/or immunotherapy agents; Docetaxel & Cytoxan      To help prevent nausea and vomiting after your treatment, we encourage you to take your nausea medication as directed.  BELOW ARE SYMPTOMS THAT SHOULD BE REPORTED IMMEDIATELY: *FEVER GREATER THAN 100.4 F (38 C) OR HIGHER *CHILLS OR SWEATING *NAUSEA AND VOMITING THAT IS NOT CONTROLLED WITH YOUR NAUSEA MEDICATION *UNUSUAL SHORTNESS OF BREATH *UNUSUAL BRUISING OR BLEEDING *URINARY PROBLEMS (pain or burning when urinating, or frequent urination) *BOWEL PROBLEMS (unusual diarrhea, constipation, pain near the anus) TENDERNESS IN MOUTH AND THROAT WITH OR WITHOUT PRESENCE OF ULCERS (sore throat, sores in mouth, or a toothache) UNUSUAL RASH, SWELLING OR PAIN  UNUSUAL VAGINAL DISCHARGE OR ITCHING   Items with * indicate a potential emergency and should be followed up as soon as possible or go to the Emergency Department if any problems should occur.  Please show the CHEMOTHERAPY ALERT CARD or IMMUNOTHERAPY ALERT  CARD at check-in to the Emergency Department and triage nurse.  Should you have questions after your visit or need to cancel or reschedule your appointment, please contact [facility]  Dept: [phone]  and follow the prompts.  Office hours are 8:00 a.m. to 4:30 p.m. Monday - Friday. Please note that voicemails left after 4:00 p.m. may not be returned until the following business day.  We are closed weekends and major holidays. You have access to a nurse at all times for urgent questions. Please call the main number to the clinic Dept: [phone] and follow the prompts.   For any non-urgent questions, you may also contact your provider using MyChart. We now offer e-Visits for anyone 18 and older to request care online for non-urgent symptoms. For details visit [email or web address].   Also download the MyChart app! Go to the app store, search "MyChart", open the app, select , and log in with your MyChart username and password.   

## 2023-03-03 ENCOUNTER — Other Ambulatory Visit: Payer: Self-pay

## 2023-03-03 ENCOUNTER — Inpatient Hospital Stay: Payer: 59

## 2023-03-03 VITALS — BP 126/60 | HR 71 | Temp 98.4°F | Resp 18

## 2023-03-03 DIAGNOSIS — R5383 Other fatigue: Secondary | ICD-10-CM | POA: Diagnosis not present

## 2023-03-03 DIAGNOSIS — Z5189 Encounter for other specified aftercare: Secondary | ICD-10-CM | POA: Diagnosis not present

## 2023-03-03 DIAGNOSIS — Z882 Allergy status to sulfonamides status: Secondary | ICD-10-CM | POA: Diagnosis not present

## 2023-03-03 DIAGNOSIS — Z79899 Other long term (current) drug therapy: Secondary | ICD-10-CM | POA: Diagnosis not present

## 2023-03-03 DIAGNOSIS — Z5111 Encounter for antineoplastic chemotherapy: Secondary | ICD-10-CM | POA: Diagnosis not present

## 2023-03-03 DIAGNOSIS — C50411 Malignant neoplasm of upper-outer quadrant of right female breast: Secondary | ICD-10-CM | POA: Diagnosis not present

## 2023-03-03 DIAGNOSIS — Z171 Estrogen receptor negative status [ER-]: Secondary | ICD-10-CM | POA: Diagnosis not present

## 2023-03-03 DIAGNOSIS — R112 Nausea with vomiting, unspecified: Secondary | ICD-10-CM | POA: Diagnosis not present

## 2023-03-03 DIAGNOSIS — C50911 Malignant neoplasm of unspecified site of right female breast: Secondary | ICD-10-CM

## 2023-03-03 DIAGNOSIS — Z79633 Long term (current) use of mitotic inhibitor: Secondary | ICD-10-CM | POA: Diagnosis not present

## 2023-03-03 DIAGNOSIS — Z923 Personal history of irradiation: Secondary | ICD-10-CM | POA: Diagnosis not present

## 2023-03-03 DIAGNOSIS — R531 Weakness: Secondary | ICD-10-CM | POA: Diagnosis not present

## 2023-03-03 DIAGNOSIS — Z79630 Long term (current) use of alkylating agent: Secondary | ICD-10-CM | POA: Diagnosis not present

## 2023-03-03 DIAGNOSIS — N6489 Other specified disorders of breast: Secondary | ICD-10-CM | POA: Diagnosis not present

## 2023-03-03 MED ORDER — PEGFILGRASTIM-JMDB 6 MG/0.6ML ~~LOC~~ SOSY
6.0000 mg | PREFILLED_SYRINGE | Freq: Once | SUBCUTANEOUS | Status: AC
  Administered 2023-03-03: 6 mg via SUBCUTANEOUS
  Filled 2023-03-03: qty 0.6

## 2023-03-21 MED FILL — Dexamethasone Sodium Phosphate Inj 100 MG/10ML: INTRAMUSCULAR | Qty: 1 | Status: AC

## 2023-03-21 MED FILL — Fosaprepitant Dimeglumine For IV Infusion 150 MG (Base Eq): INTRAVENOUS | Qty: 5 | Status: AC

## 2023-03-22 ENCOUNTER — Other Ambulatory Visit: Payer: Self-pay

## 2023-03-22 ENCOUNTER — Inpatient Hospital Stay: Payer: 59

## 2023-03-22 ENCOUNTER — Inpatient Hospital Stay (HOSPITAL_BASED_OUTPATIENT_CLINIC_OR_DEPARTMENT_OTHER): Payer: 59 | Admitting: Adult Health

## 2023-03-22 ENCOUNTER — Inpatient Hospital Stay: Payer: 59 | Attending: Hematology and Oncology

## 2023-03-22 VITALS — BP 134/71 | HR 82 | Temp 97.9°F | Resp 18 | Ht 61.0 in | Wt 212.9 lb

## 2023-03-22 DIAGNOSIS — Z9221 Personal history of antineoplastic chemotherapy: Secondary | ICD-10-CM | POA: Insufficient documentation

## 2023-03-22 DIAGNOSIS — Z9049 Acquired absence of other specified parts of digestive tract: Secondary | ICD-10-CM | POA: Insufficient documentation

## 2023-03-22 DIAGNOSIS — F17210 Nicotine dependence, cigarettes, uncomplicated: Secondary | ICD-10-CM | POA: Insufficient documentation

## 2023-03-22 DIAGNOSIS — R5383 Other fatigue: Secondary | ICD-10-CM | POA: Diagnosis not present

## 2023-03-22 DIAGNOSIS — Z171 Estrogen receptor negative status [ER-]: Secondary | ICD-10-CM | POA: Diagnosis not present

## 2023-03-22 DIAGNOSIS — E876 Hypokalemia: Secondary | ICD-10-CM | POA: Insufficient documentation

## 2023-03-22 DIAGNOSIS — Z882 Allergy status to sulfonamides status: Secondary | ICD-10-CM | POA: Diagnosis not present

## 2023-03-22 DIAGNOSIS — C50911 Malignant neoplasm of unspecified site of right female breast: Secondary | ICD-10-CM

## 2023-03-22 DIAGNOSIS — C50411 Malignant neoplasm of upper-outer quadrant of right female breast: Secondary | ICD-10-CM

## 2023-03-22 DIAGNOSIS — Z83719 Family history of colon polyps, unspecified: Secondary | ICD-10-CM | POA: Insufficient documentation

## 2023-03-22 DIAGNOSIS — Z79633 Long term (current) use of mitotic inhibitor: Secondary | ICD-10-CM | POA: Diagnosis not present

## 2023-03-22 DIAGNOSIS — Z95828 Presence of other vascular implants and grafts: Secondary | ICD-10-CM

## 2023-03-22 DIAGNOSIS — E785 Hyperlipidemia, unspecified: Secondary | ICD-10-CM | POA: Insufficient documentation

## 2023-03-22 DIAGNOSIS — Z823 Family history of stroke: Secondary | ICD-10-CM | POA: Insufficient documentation

## 2023-03-22 DIAGNOSIS — C50912 Malignant neoplasm of unspecified site of left female breast: Secondary | ICD-10-CM | POA: Diagnosis not present

## 2023-03-22 DIAGNOSIS — Z87442 Personal history of urinary calculi: Secondary | ICD-10-CM | POA: Insufficient documentation

## 2023-03-22 DIAGNOSIS — Z5111 Encounter for antineoplastic chemotherapy: Secondary | ICD-10-CM | POA: Insufficient documentation

## 2023-03-22 DIAGNOSIS — R112 Nausea with vomiting, unspecified: Secondary | ICD-10-CM | POA: Diagnosis not present

## 2023-03-22 DIAGNOSIS — R202 Paresthesia of skin: Secondary | ICD-10-CM | POA: Diagnosis not present

## 2023-03-22 DIAGNOSIS — Z79630 Long term (current) use of alkylating agent: Secondary | ICD-10-CM | POA: Insufficient documentation

## 2023-03-22 DIAGNOSIS — Z923 Personal history of irradiation: Secondary | ICD-10-CM | POA: Diagnosis not present

## 2023-03-22 DIAGNOSIS — I10 Essential (primary) hypertension: Secondary | ICD-10-CM | POA: Insufficient documentation

## 2023-03-22 DIAGNOSIS — Z8249 Family history of ischemic heart disease and other diseases of the circulatory system: Secondary | ICD-10-CM | POA: Diagnosis not present

## 2023-03-22 DIAGNOSIS — Z833 Family history of diabetes mellitus: Secondary | ICD-10-CM | POA: Diagnosis not present

## 2023-03-22 DIAGNOSIS — Z841 Family history of disorders of kidney and ureter: Secondary | ICD-10-CM | POA: Insufficient documentation

## 2023-03-22 DIAGNOSIS — Z818 Family history of other mental and behavioral disorders: Secondary | ICD-10-CM | POA: Insufficient documentation

## 2023-03-22 DIAGNOSIS — Z79899 Other long term (current) drug therapy: Secondary | ICD-10-CM | POA: Insufficient documentation

## 2023-03-22 DIAGNOSIS — Z90710 Acquired absence of both cervix and uterus: Secondary | ICD-10-CM | POA: Insufficient documentation

## 2023-03-22 DIAGNOSIS — Z803 Family history of malignant neoplasm of breast: Secondary | ICD-10-CM | POA: Insufficient documentation

## 2023-03-22 LAB — CMP (CANCER CENTER ONLY)
ALT: 10 U/L (ref 0–44)
AST: 11 U/L — ABNORMAL LOW (ref 15–41)
Albumin: 3.4 g/dL — ABNORMAL LOW (ref 3.5–5.0)
Alkaline Phosphatase: 62 U/L (ref 38–126)
Anion gap: 6 (ref 5–15)
BUN: 14 mg/dL (ref 8–23)
CO2: 27 mmol/L (ref 22–32)
Calcium: 9 mg/dL (ref 8.9–10.3)
Chloride: 109 mmol/L (ref 98–111)
Creatinine: 0.74 mg/dL (ref 0.44–1.00)
GFR, Estimated: >60 mL/min (ref >60)
Glucose, Bld: 109 mg/dL — ABNORMAL HIGH (ref 70–99)
Potassium: 3.7 mmol/L (ref 3.5–5.1)
Sodium: 142 mmol/L (ref 135–145)
Total Bilirubin: 0.4 mg/dL (ref 0.3–1.2)
Total Protein: 6.8 g/dL (ref 6.5–8.1)

## 2023-03-22 LAB — CBC WITH DIFFERENTIAL (CANCER CENTER ONLY)
Abs Immature Granulocytes: 0.02 10*3/uL (ref 0.00–0.07)
Basophils Absolute: 0 10*3/uL (ref 0.0–0.1)
Basophils Relative: 0 %
Eosinophils Absolute: 0 10*3/uL (ref 0.0–0.5)
Eosinophils Relative: 0 %
HCT: 32.9 % — ABNORMAL LOW (ref 36.0–46.0)
Hemoglobin: 11 g/dL — ABNORMAL LOW (ref 12.0–15.0)
Immature Granulocytes: 0 %
Lymphocytes Relative: 16 %
Lymphs Abs: 1.1 10*3/uL (ref 0.7–4.0)
MCH: 31.3 pg (ref 26.0–34.0)
MCHC: 33.4 g/dL (ref 30.0–36.0)
MCV: 93.7 fL (ref 80.0–100.0)
Monocytes Absolute: 0.7 10*3/uL (ref 0.1–1.0)
Monocytes Relative: 11 %
Neutro Abs: 4.9 10*3/uL (ref 1.7–7.7)
Neutrophils Relative %: 73 %
Platelet Count: 257 10*3/uL (ref 150–400)
RBC: 3.51 MIL/uL — ABNORMAL LOW (ref 3.87–5.11)
RDW: 17.8 % — ABNORMAL HIGH (ref 11.5–15.5)
WBC Count: 6.8 10*3/uL (ref 4.0–10.5)
nRBC: 0 % (ref 0.0–0.2)

## 2023-03-22 MED ORDER — HEPARIN SOD (PORK) LOCK FLUSH 100 UNIT/ML IV SOLN
500.0000 [IU] | Freq: Once | INTRAVENOUS | Status: AC | PRN
  Administered 2023-03-22: 500 [IU]

## 2023-03-22 MED ORDER — SODIUM CHLORIDE 0.9 % IV SOLN
500.0000 mg/m2 | Freq: Once | INTRAVENOUS | Status: AC
  Administered 2023-03-22: 1000 mg via INTRAVENOUS
  Filled 2023-03-22: qty 50

## 2023-03-22 MED ORDER — SODIUM CHLORIDE 0.9 % IV SOLN: Freq: Once | INTRAVENOUS | Status: AC

## 2023-03-22 MED ORDER — SODIUM CHLORIDE 0.9 % IV SOLN
150.0000 mg | Freq: Once | INTRAVENOUS | Status: AC
  Administered 2023-03-22: 150 mg via INTRAVENOUS
  Filled 2023-03-22: qty 150

## 2023-03-22 MED ORDER — SODIUM CHLORIDE 0.9% FLUSH
10.0000 mL | INTRAVENOUS | Status: DC | PRN
  Administered 2023-03-22: 10 mL

## 2023-03-22 MED ORDER — PALONOSETRON HCL INJECTION 0.25 MG/5ML
0.2500 mg | Freq: Once | INTRAVENOUS | Status: AC
  Administered 2023-03-22: 0.25 mg via INTRAVENOUS
  Filled 2023-03-22: qty 5

## 2023-03-22 MED ORDER — SODIUM CHLORIDE 0.9 % IV SOLN
10.0000 mg | Freq: Once | INTRAVENOUS | Status: AC
  Administered 2023-03-22: 10 mg via INTRAVENOUS
  Filled 2023-03-22: qty 10

## 2023-03-22 MED ORDER — SODIUM CHLORIDE 0.9 % IV SOLN
50.0000 mg/m2 | Freq: Once | INTRAVENOUS | Status: AC
  Administered 2023-03-22: 104 mg via INTRAVENOUS
  Filled 2023-03-22: qty 10.4

## 2023-03-22 NOTE — Patient Instructions (Signed)
[facility]  Discharge Instructions: Thank you for choosing [facility] to provide your oncology and hematology care.   If you have a lab appointment with the Cancer Center, please go directly to the Cancer Center and check in at the registration area.   Wear comfortable clothing and clothing appropriate for easy access to any Portacath or PICC line.   We strive to give you quality time with your provider. You may need to reschedule your appointment if you arrive late (15 or more minutes).  Arriving late affects you and other patients whose appointments are after yours.  Also, if you miss three or more appointments without notifying the office, you may be dismissed from the clinic at the provider's discretion.      For prescription refill requests, have your pharmacy contact our office and allow 72 hours for refills to be completed.    Today you received the following chemotherapy and/or immunotherapy agents; Docetaxel & Cytoxan      To help prevent nausea and vomiting after your treatment, we encourage you to take your nausea medication as directed.  BELOW ARE SYMPTOMS THAT SHOULD BE REPORTED IMMEDIATELY: *FEVER GREATER THAN 100.4 F (38 C) OR HIGHER *CHILLS OR SWEATING *NAUSEA AND VOMITING THAT IS NOT CONTROLLED WITH YOUR NAUSEA MEDICATION *UNUSUAL SHORTNESS OF BREATH *UNUSUAL BRUISING OR BLEEDING *URINARY PROBLEMS (pain or burning when urinating, or frequent urination) *BOWEL PROBLEMS (unusual diarrhea, constipation, pain near the anus) TENDERNESS IN MOUTH AND THROAT WITH OR WITHOUT PRESENCE OF ULCERS (sore throat, sores in mouth, or a toothache) UNUSUAL RASH, SWELLING OR PAIN  UNUSUAL VAGINAL DISCHARGE OR ITCHING   Items with * indicate a potential emergency and should be followed up as soon as possible or go to the Emergency Department if any problems should occur.  Please show the CHEMOTHERAPY ALERT CARD or IMMUNOTHERAPY ALERT  CARD at check-in to the Emergency Department and triage nurse.  Should you have questions after your visit or need to cancel or reschedule your appointment, please contact [facility]  Dept: [phone]  and follow the prompts.  Office hours are 8:00 a.m. to 4:30 p.m. Monday - Friday. Please note that voicemails left after 4:00 p.m. may not be returned until the following business day.  We are closed weekends and major holidays. You have access to a nurse at all times for urgent questions. Please call the main number to the clinic Dept: [phone] and follow the prompts.   For any non-urgent questions, you may also contact your provider using MyChart. We now offer e-Visits for anyone 18 and older to request care online for non-urgent symptoms. For details visit [email or web address].   Also download the MyChart app! Go to the app store, search "MyChart", open the app, select , and log in with your MyChart username and password.   

## 2023-03-22 NOTE — Progress Notes (Signed)
[facility] Cancer Follow up:    Katie Craze, NP [address] [place] [address]   DIAGNOSIS:  Cancer Staging  Malignant neoplasm of upper-outer quadrant of right breast in female, estrogen receptor negative (HCC) Staging form: Breast, AJCC 7th Edition - Clinical stage from 11/19/2014: Stage IA (T1c, N0, M0) - Unsigned Staged by: Pathologist and managing physician Laterality: Right Estrogen receptor status: Negative Progesterone receptor status: Negative HER2 status: Negative Stage used in treatment planning: Yes National guidelines used in treatment planning: Yes Type of national guideline used in treatment planning: NCCN - Pathologic stage from 03/04/2015: Stage IA (yT1b, N0, cM0) - Signed by [name], MD on 03/16/2015 Staged by: Pathologist Stage prefix: Post-therapy Laterality: Right Estrogen receptor status: Negative Progesterone receptor status: Negative HER2 status: Negative Stage used in treatment planning: Yes National guidelines used in treatment planning: Yes Type of national guideline used in treatment planning: NCCN Staging comments: Staged on final lumpectomy specimen by Dr. Raynald Blend - Pathologic: Stage IIB (T3, N0, cM0) - Signed by [name], MD on 12/28/2022 Stage prefix: Initial diagnosis Laterality: Bilateral Histologic grade (G): G3 Paget's disease: Negative Estrogen receptor status: Negative Progesterone receptor status: Negative   SUMMARY OF ONCOLOGIC HISTORY: Oncology History  Malignant neoplasm of upper-outer quadrant of right breast in female, estrogen receptor negative (HCC)  10/24/2014 Mammogram   Right breast: ill-defined mass in the UOQ   10/24/2014 Breast US   Right breast: irregular hypoechoic taller than wide mass in the 11 o'clock location, 4 cm from the nipple, measuring 0.9 x 1.1 x 1.2 cm. Evaluation of the right axilla is negative for adenopathy.   11/12/2014 Initial Biopsy   Right breast needle  core bx: Invasive ductal carcinoma, ER- (0%), PR- (0%), HER2/neu negative, Ki67 19%, grade 2-3.   11/20/2014 Procedure   Genetic testing: BreastNext panel [facility]) reveals no clinically significant variant at ATM, BARD1, BRCA1, BRCA2, BRIP1, CDH1, CHEK2, MRE11A, MUTYH, NBN, NF1, PALB2, PTEN, RAD50, RAD51C, RAD51D, and TP53.   11/21/2014 Breast MRI   Right breast mass measuring up to 1.3 cm without lymphadenopathy or findings to suggest multifocal or multicentric disease. Benign circumscribed oval enhancing mass located posterior medial to the malignancy consistent with fibroadenoma   11/24/2014 Clinical Stage   Stage IA: T1c N0   12/02/2014 - 02/03/2015 Neo-Adjuvant Chemotherapy   Cyclophosphamide and docetaxel x 4 cycles   02/09/2015 Breast MRI   Right breast mass measures 1.0 x 0.7 x 0.5 cm, decreased from 1.4 x 1.4 x 1.0 cm. No additional areas of suspicion in right or left breast.   03/02/2015 Definitive Surgery   Right lumpectomy / [facility] Dwain Sarna): IDC, grade 1, with high grade DCIS spanning 1 cmHER2/neu repeated and remains negative (ratio 1.62). 4 LN removed and negative for malignancy (0/4 LN).   03/02/2015 Pathologic Stage   Stage IA: ypT1b ypN0   04/20/2015 - 05/29/2015 Radiation Therapy   Adjuvant RT Basilio Cairo): Right Breast  50 Gy over 25 fractions. Right Breast boost 10 Gy over 5 fractions. Total dose: 60 Gy.   07/28/2015 Survivorship   Survivorship visit completed and copy of care plan provided to patient.   12/15/2022 Surgery   Left lumpectomy: Grade 3 IDC 2.2 cm, margins negative, ER 0%, PR 0%, HER2 negative, Ki-67 95%, 0/2 lymph nodes negative Right mastectomy: Grade 3 IDC 6.6 cm with DCIS, margins negative, ER 2%, PR 0%, HER2 negative, Ki-67 50%   12/28/2022 Cancer Staging   Staging form: Breast, AJCC 7th  Edition - Pathologic: Stage IIB (T3, N0, cM0) - Signed by [name], MD on 12/28/2022 Stage prefix: Initial diagnosis Laterality: Bilateral Histologic grade (G):  G3 Paget's disease: Negative Estrogen receptor status: Negative Progesterone receptor status: Negative   Bilateral malignant neoplasm of breast in female [facility])  11/02/2022 Initial Diagnosis   Bilateral malignant neoplasm of breast in female [facility])   01/18/2023 -  Chemotherapy   Patient is on Treatment Plan : BREAST TC q21d       CURRENT THERAPY: taxotere/cytoxan cycle 4  INTERVAL HISTORY: Katie Jacobson 67 y.o. female returns for f/u and evaluation prior to receiving taxotere and cytoxan.  She is receiving this at a dose reduction after she required hospitalization after her first cycle due to dehydration.  Since dose reduction she has been tolerating it well.  She has mild intermittent paresthesias in her fingertips and toes.  She denies any worsening in this and it has been going on since cycle 3.    Her main concern today is that her mastectomy site is open and draining.  She says that the drainage has become malodorous since this past weekend.  She denies any swelling, tenderness, pain, fever, or chills.     Patient Active Problem List   Diagnosis Date Noted   Intractable nausea and vomiting 01/26/2023   AKI (acute kidney injury) (HCC) 01/26/2023   Hypokalemia 01/26/2023   Port-A-Cath in place 01/24/2023   Breast cancer (HCC) 12/15/2022   Bilateral malignant neoplasm of breast in female [facility]) 11/02/2022   Allergic reaction to chemical substance 01/30/2022   Preventative health care 01/28/2022   Cervical radiculopathy 04/20/2021   Primary osteoarthritis of left knee 03/09/2021   Cervical strain 02/09/2021   Sprain of medial collateral ligament of left knee 02/09/2021   Mitral regurgitation due to cusp prolapse 01/02/2020   Genetic testing 01/25/2016   Microscopic hematuria 09/06/2015   Vaginal dryness 12/09/2014   Family history of breast cancer    Malignant neoplasm of upper-outer quadrant of right breast in female, estrogen receptor negative (HCC) 11/14/2014    Intermittent palpitations    Obesity (BMI 35.0-39.9 without comorbidity)    Hyperlipidemia LDL goal <100 07/07/2011   History of partial seizures 04/01/2011   Asthma 04/01/2011   Mitral valve prolapse 04/01/2011   Hot flashes 04/01/2011   Tobacco abuse 04/01/2011   Essential hypertension 04/01/2011    is allergic to sulfa antibiotics.  MEDICAL HISTORY: Past Medical History:  Diagnosis Date   Allergy    Arthritis    Breast cancer of upper-outer quadrant of right female breast (HCC) 11/14/2014   Treated with chemotherapy and radiation   Heart murmur    History of chemotherapy    finished chemo 02/03/2015   History of kidney stones    History of seizures    as a child - unknown cause - states was never on anticonvulsants   Hypertension    states under control with meds., has been on med. x "years"   Mitral valve prolapse 1990   Echo 2023 with no MVP. Mild-mod aortic regurg and mild mitral regurg. 3 yr followup recommended.   Osteoporosis    Personal history of chemotherapy    Personal history of radiation therapy    Seasonal allergies     SURGICAL HISTORY: Past Surgical History:  Procedure Laterality Date   ABDOMINAL HYSTERECTOMY  1998   partial   APPENDECTOMY  1976   BREAST BIOPSY Right 09/30/2022   [place] RT BREAST BX W LOC DEV  1ST LESION IMG BX SPEC US GUIDE 09/30/2022 [facility]   BREAST BIOPSY Left 09/30/2022   [place] LT BREAST BX W LOC DEV 1ST LESION IMG BX SPEC US GUIDE 09/30/2022 [facility]   BREAST BIOPSY Right 09/30/2022   [place] RT BREAST BX W LOC DEV EA ADD LESION IMG BX SPEC US GUIDE 09/30/2022 [facility]   BREAST BIOPSY  12/14/2022   MM LT RADIOACTIVE SEED LOC MAMMO GUIDE 12/14/2022 [facility]   BREAST LUMPECTOMY Right 02/2015   BREAST LUMPECTOMY WITH RADIOACTIVE SEED AND SENTINEL LYMPH NODE BIOPSY Left 12/15/2022   Procedure: LEFT BREAST LUMPECTOMY WITH RADIOACTIVE SEED AND AXILLARY SENTINEL LYMPH NODE BIOPSY;  Surgeon: Emelia Loron,  MD;  Location: [facility] OR;  Service: General;  Laterality: Left;   COLONOSCOPY  09/30/2016   KNEE ARTHROSCOPY Left 03/26/2008   MASTECTOMY W/ SENTINEL NODE BIOPSY Right 12/15/2022   Procedure: RIGHT MASTECTOMY WITH RIGHT AXILLARY SENTINEL LYMPH NODE BIOPSY;  Surgeon: Emelia Loron, MD;  Location: [facility] OR;  Service: General;  Laterality: Right;  180 MIN ROOM 9   PLANTAR'S WART EXCISION Left    x 2   PORT-A-CATH REMOVAL Left 04/06/2015   Procedure: REMOVAL PORT-A-CATH;  Surgeon: Emelia Loron, MD;  Location: [facility];  Service: General;  Laterality: Left;   PORTACATH PLACEMENT N/A 11/27/2014   Procedure: INSERTION PORT-A-CATH;  Surgeon: Emelia Loron, MD;  Location: [facility];  Service: General;  Laterality: N/A;   PORTACATH PLACEMENT Right 12/15/2022   Procedure: INSERTION PORT-A-CATH;  Surgeon: Emelia Loron, MD;  Location: [facility] OR;  Service: General;  Laterality: Right;   RADIOACTIVE SEED GUIDED PARTIAL MASTECTOMY WITH AXILLARY SENTINEL LYMPH NODE BIOPSY Right 03/02/2015   Procedure: RADIOACTIVE SEED GUIDED RIGHT PARTIAL MASTECTOMY WITH RIGHT AXILLARY SENTINEL LYMPH NODE BIOPSY;  Surgeon: Emelia Loron, MD;  Location: [facility];  Service: General;  Laterality: Right;   SALIVARY STONE REMOVAL Right 1972   TONSILLECTOMY  1970s   TRANSTHORACIC ECHOCARDIOGRAM  01/16/2020   EF 60 to 65%.  GRII DD.  No R WMA.  Normal RV size and function.  Rheumatic mitral valve with moderate thickening-hockey-stick appearing.  Moderate MR with mild MS.  Moderate LA dilation.  (Recommend follow-up in 2 to 3 years)   TRANSTHORACIC ECHOCARDIOGRAM  05/31/2022   Normal LV size and function-EF 60 to 65%.  No RWMA. ??  Normal diastolic parameters but elevated LAP?  Normal RV with normal RVP and RAP.  Manage mitral valve with mild MR and no MS.  Mild to moderate AI with aortic sclerosis but no stenosis.   TUBAL LIGATION  1996    SOCIAL HISTORY: Social History    Socioeconomic History   Marital status: Married    Spouse name: Not on file   Number of children: 2   Years of education: Not on file   Highest education level: Not on file  Occupational History   Not on file  Tobacco Use   Smoking status: Every Day    Packs/day: 0.50    Years: 43.00    Additional pack years: 0.00    Total pack years: 21.50    Types: Cigarettes   Smokeless tobacco: Former    Quit date: 09/09/2016   Tobacco comments:    10 cig./day  Vaping Use   Vaping Use: Never used  Substance and Sexual Activity   Alcohol use: Yes    Alcohol/week: 5.0 standard drinks of alcohol    Types: 5 Standard drinks or equivalent per week  Comment: occ    Drug use: No   Sexual activity: Yes    Partners: Male  Other Topics Concern   Not on file  Social History Narrative   Married with 2 daughters- born 33 and 45 ([name] died in MVA 04-20-16)   Previously worked -- Therapist, 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  Has been doing part time work with [employer] -- had 6 active clients, but the most important one is her Daughter.   Regular exercise:  No   Caffeine Use: none; Current 1ppd smoker x > 39 yrs; EtOH ~4 oz/week   Social Determinants of Health   Financial Resource Strain: Not on file  Food Insecurity: No Food Insecurity (01/30/2023)   Hunger Vital Sign    Worried About Running Out of Food in the Last Year: Never true    Ran Out of Food in the Last Year: Never true  Transportation Needs: No Transportation Needs (01/30/2023)   PRAPARE - Administrator, Civil Service (Medical): No    Lack of Transportation (Non-Medical): No  Physical Activity: Not on file  Stress: Not on file  Social Connections: Not on file  Intimate Partner Violence: Not At Risk (01/26/2023)   Humiliation, Afraid, Rape, and Kick questionnaire    Fear of Current or Ex-Partner: No    Emotionally Abused: No    Physically Abused: No    Sexually Abused: No    FAMILY  HISTORY: Family History  Problem Relation Age of Onset   Heart disease Mother    Stroke Mother    Hypertension Mother    Diabetes Mother    Diabetes Sister    Kidney disease Brother    Mental retardation Brother    Diabetes Sister    Heart attack Father    Breast cancer Cousin        deceased 34   Colon polyps Daughter    Colon cancer Neg Hx    Esophageal cancer Neg Hx    Rectal cancer Neg Hx    Stomach cancer Neg Hx     Review of Systems  Constitutional:  Positive for fatigue. Negative for appetite change, chills, fever and unexpected weight change.  HENT:   Negative for hearing loss, lump/mass and trouble swallowing.   Eyes:  Negative for eye problems and icterus.  Respiratory:  Negative for chest tightness, cough and shortness of breath.   Cardiovascular:  Negative for chest pain, leg swelling and palpitations.  Gastrointestinal:  Negative for abdominal distention, abdominal pain, constipation, diarrhea, nausea and vomiting.  Endocrine: Negative for hot flashes.  Genitourinary:  Negative for difficulty urinating.   Musculoskeletal:  Negative for arthralgias.  Skin:  Positive for wound. Negative for itching and rash.  Neurological:  Negative for dizziness, extremity weakness, headaches and numbness.  Hematological:  Negative for adenopathy. Does not bruise/bleed easily.  Psychiatric/Behavioral:  Negative for depression. The patient is not nervous/anxious.       PHYSICAL EXAMINATION   Onc Performance Status - 03/22/23 1000       ECOG Perf Status   ECOG Perf Status Restricted in physically strenuous activity but ambulatory and able to carry out work of a light or sedentary nature, e.g., light house work, office work      KPS SCALE   KPS % SCORE Able to carry on normal activity, minor s/s of disease             Vitals:   03/22/23 1022  BP: 134/71  Pulse: 82  Resp: 18  Temp: 97.9 F (36.6 C)  SpO2: 98%    Physical Exam Constitutional:      General: She  is not in acute distress.    Appearance: Normal appearance. She is not toxic-appearing.  HENT:     Head: Normocephalic and atraumatic.     Mouth/Throat:     Mouth: Mucous membranes are moist.     Pharynx: Oropharynx is clear. No oropharyngeal exudate or posterior oropharyngeal erythema.  Eyes:     General: No scleral icterus. Cardiovascular:     Rate and Rhythm: Normal rate and regular rhythm.     Pulses: Normal pulses.     Heart sounds: Normal heart sounds.  Pulmonary:     Effort: Pulmonary effort is normal.     Breath sounds: Normal breath sounds.  Abdominal:     General: Abdomen is flat. Bowel sounds are normal. There is no distension.     Palpations: Abdomen is soft.     Tenderness: There is no abdominal tenderness.  Musculoskeletal:        General: No swelling.     Cervical back: Neck supple.  Lymphadenopathy:     Cervical: No cervical adenopathy.  Skin:    General: Skin is warm and dry.     Findings: No rash.  Neurological:     General: No focal deficit present.     Mental Status: She is alert.  Psychiatric:        Mood and Affect: Mood normal.        Behavior: Behavior normal.     LABORATORY DATA:  CBC    Component Value Date/Time   WBC 6.8 03/22/2023 1000   WBC 14.3 (H) 01/29/2023 0500   RBC 3.51 (L) 03/22/2023 1000   HGB 11.0 (L) 03/22/2023 1000   HGB 14.4 12/28/2016 1359   HCT 32.9 (L) 03/22/2023 1000   HCT 42.7 12/28/2016 1359   PLT 257 03/22/2023 1000   PLT 213 12/28/2016 1359   MCV 93.7 03/22/2023 1000   MCV 93.6 12/28/2016 1359   MCH 31.3 03/22/2023 1000   MCHC 33.4 03/22/2023 1000   RDW 17.8 (H) 03/22/2023 1000   RDW 15.8 (H) 12/28/2016 1359   LYMPHSABS 1.1 03/22/2023 1000   LYMPHSABS 2.1 12/28/2016 1359   MONOABS 0.7 03/22/2023 1000   MONOABS 0.4 12/28/2016 1359   EOSABS 0.0 03/22/2023 1000   EOSABS 0.1 12/28/2016 1359   BASOSABS 0.0 03/22/2023 1000   BASOSABS 0.0 12/28/2016 1359    CMP     Component Value Date/Time   NA 142  03/22/2023 1000   NA 139 12/28/2016 1359   K 3.7 03/22/2023 1000   K 3.6 12/28/2016 1359   CL 109 03/22/2023 1000   CO2 27 03/22/2023 1000   CO2 25 12/28/2016 1359   GLUCOSE 109 (H) 03/22/2023 1000   GLUCOSE 85 12/28/2016 1359   BUN 14 03/22/2023 1000   BUN 15.6 12/28/2016 1359   CREATININE 0.74 03/22/2023 1000   CREATININE 0.90 01/28/2022 1509   CREATININE 0.9 12/28/2016 1359   CALCIUM 9.0 03/22/2023 1000   CALCIUM 9.7 12/28/2016 1359   PROT 6.8 03/22/2023 1000   PROT 7.5 12/28/2016 1359   ALBUMIN 3.4 (L) 03/22/2023 1000   ALBUMIN 3.7 12/28/2016 1359   AST 11 (L) 03/22/2023 1000   AST 14 12/28/2016 1359   ALT 10 03/22/2023 1000   ALT 17 12/28/2016 1359   ALKPHOS 62 03/22/2023 1000   ALKPHOS 86 12/28/2016  1359   BILITOT 0.4 03/22/2023 1000   BILITOT 0.35 12/28/2016 1359   GFRNONAA >60 03/22/2023 1000   GFRNONAA 70 09/02/2013 1128   GFRAA >60 12/28/2017 1342   GFRAA 81 09/02/2013 1128         ASSESSMENT and THERAPY PLAN:   Malignant neoplasm of upper-outer quadrant of right breast in female, estrogen receptor negative (HCC) 11/12/2014: Right breast biopsy T1 cN0 stage Ia triple negative IDC Ki-67 19% 11/12/2014-02/03/2015: TC x 4 neoadjuvant chemotherapy, genetics negative 03/02/2015: Right lumpectomy: Grade 1 IDC negative margins triple negative August 2016: Completed radiation   November 2023: Asymmetry left breast and a possible mass (0.8 cm) and asymmetry in the right breast (1.7 cm and 1 cm) Right breast biopsy: 12:00: Grade 2 IDC ER 2%, PR 0%, Ki-67 50%, HER2 negative 2+ by IHC, 1230: Grade 2 IDC   left breast biopsy 1:00: Grade 2 IDC, ER 0%, PR 0%, Ki-67 95%, HER2 2+ , FISH: Negative   CT CAP and bone scan: 10/24/2022: No evidence of metastatic disease.   Treatment plan: 12/15/2022:Left lumpectomy: Grade 3 IDC 2.2 cm, margins negative, ER 0%, PR 0%, HER2 negative, Ki-67 95%, 0/2 lymph nodes negative Right mastectomy: Grade 3 IDC 6.6 cm with DCIS, margins negative,  ER 2%, PR 0%, HER2 negative, Ki-67 50% Adjuvant chemotherapy: With Taxotere and Cytoxan every 3 weeks x 4 ----------------------------------------------------------------------------------------------------------------------------------- Current treatment: Taxotere Cytoxan cycle 4 Chemo toxicities: Profound nausea and vomiting: Hospitalization 01/26/2023-01/29/2023.  Taxotere/Cytoxan dose reduced.  Emend added to anti -emetic regimen. Fatigue: managed with energy conservation Grade 1 paresthesias-no motor or sensory deficits.  Intermittent.  Will proceed with last round of treatment. Mastectomy wound: I took a picture and shared this with Dr. Dwain Sarna.  It does not appear to be acutely infected.  She will proceed with treatment today.  Stacie will proceed with treatment today.  She is delighted to be finishing her chemotherapy.  I requested follow-up with Dr. Pamelia Hoit in 2 weeks.  I cannot find anywhere a recommendation for her to undergo a left breast radiation.  Angie Fava our nurse navigator reached out to me as well and let me know that she would check her conference notes.  All questions were answered. The patient knows to call the clinic with any problems, questions or concerns. We can certainly see the patient much sooner if necessary.  Total encounter time:30 minutes*in face-to-face visit time, chart review, lab review, care coordination, order entry, and documentation of the encounter time.    Lillard Anes, NP 03/23/23 11:41 AM Medical Oncology and Hematology [facility] [address] [place], [place] [address] Tel. [phone]    Fax. [phone]  *Total Encounter Time as defined by the Centers for Medicare and Medicaid Services includes, in addition to the face-to-face time of a patient visit (documented in the note above) non-face-to-face time: obtaining and reviewing outside history, ordering and reviewing medications, tests or procedures, care coordination  (communications with other health care professionals or caregivers) and documentation in the medical record.

## 2023-03-22 NOTE — Progress Notes (Signed)
Non-stick dressing applied to right mid-chest surgical wound and secured with paper tape. Wound approximately 6-7 inches. Edges of incision approximated with exception of area mid incision that is open slightly. Small amount clear drainage present in that area. Patient assisted with holding dressing in place during taping, states she does that at home when her husband changes it. Patient tolerated well with no complaints of discomfort.

## 2023-03-23 ENCOUNTER — Encounter: Payer: Self-pay | Admitting: Hematology and Oncology

## 2023-03-23 NOTE — Assessment & Plan Note (Signed)
11/12/2014: Right breast biopsy T1 cN0 stage Ia triple negative IDC Ki-67 19% 11/12/2014-02/03/2015: TC x 4 neoadjuvant chemotherapy, genetics negative 03/02/2015: Right lumpectomy: Grade 1 IDC negative margins triple negative August 2016: Completed radiation   November 2023: Asymmetry left breast and a possible mass (0.8 cm) and asymmetry in the right breast (1.7 cm and 1 cm) Right breast biopsy: 12:00: Grade 2 IDC ER 2%, PR 0%, Ki-67 50%, HER2 negative 2+ by IHC, 1230: Grade 2 IDC   left breast biopsy 1:00: Grade 2 IDC, ER 0%, PR 0%, Ki-67 95%, HER2 2+ , FISH: Negative   CT CAP and bone scan: 10/24/2022: No evidence of metastatic disease.   Treatment plan: 12/15/2022:Left lumpectomy: Grade 3 IDC 2.2 cm, margins negative, ER 0%, PR 0%, HER2 negative, Ki-67 95%, 0/2 lymph nodes negative Right mastectomy: Grade 3 IDC 6.6 cm with DCIS, margins negative, ER 2%, PR 0%, HER2 negative, Ki-67 50% Adjuvant chemotherapy: With Taxotere and Cytoxan every 3 weeks x 4 ----------------------------------------------------------------------------------------------------------------------------------- Current treatment: Taxotere Cytoxan cycle 4 Chemo toxicities: Profound nausea and vomiting: Hospitalization 01/26/2023-01/29/2023.  Taxotere/Cytoxan dose reduced.  Emend added to anti -emetic regimen. Fatigue: managed with energy conservation Grade 1 paresthesias-no motor or sensory deficits.  Intermittent.  Will proceed with last round of treatment. Mastectomy wound: I took a picture and shared this with Dr. Dwain Sarna.  It does not appear to be acutely infected.  She will proceed with treatment today.  Darinda will proceed with treatment today.  She is delighted to be finishing her chemotherapy.  I requested follow-up with Dr. Pamelia Hoit in 2 weeks.  I cannot find anywhere a recommendation for her to undergo a left breast radiation.  Angie Fava our nurse navigator reached out to me as well and let me know that she would  check her conference notes.

## 2023-03-24 ENCOUNTER — Other Ambulatory Visit: Payer: Self-pay

## 2023-03-24 ENCOUNTER — Inpatient Hospital Stay: Payer: 59

## 2023-03-24 VITALS — BP 118/65 | HR 77 | Temp 98.4°F | Resp 18

## 2023-03-24 DIAGNOSIS — R112 Nausea with vomiting, unspecified: Secondary | ICD-10-CM | POA: Diagnosis not present

## 2023-03-24 DIAGNOSIS — E785 Hyperlipidemia, unspecified: Secondary | ICD-10-CM | POA: Diagnosis not present

## 2023-03-24 DIAGNOSIS — Z171 Estrogen receptor negative status [ER-]: Secondary | ICD-10-CM | POA: Diagnosis not present

## 2023-03-24 DIAGNOSIS — I10 Essential (primary) hypertension: Secondary | ICD-10-CM | POA: Diagnosis not present

## 2023-03-24 DIAGNOSIS — C50411 Malignant neoplasm of upper-outer quadrant of right female breast: Secondary | ICD-10-CM | POA: Diagnosis not present

## 2023-03-24 DIAGNOSIS — F17210 Nicotine dependence, cigarettes, uncomplicated: Secondary | ICD-10-CM | POA: Diagnosis not present

## 2023-03-24 DIAGNOSIS — R202 Paresthesia of skin: Secondary | ICD-10-CM | POA: Diagnosis not present

## 2023-03-24 DIAGNOSIS — Z5111 Encounter for antineoplastic chemotherapy: Secondary | ICD-10-CM | POA: Diagnosis not present

## 2023-03-24 DIAGNOSIS — C50911 Malignant neoplasm of unspecified site of right female breast: Secondary | ICD-10-CM

## 2023-03-24 DIAGNOSIS — R5383 Other fatigue: Secondary | ICD-10-CM | POA: Diagnosis not present

## 2023-03-24 DIAGNOSIS — E876 Hypokalemia: Secondary | ICD-10-CM | POA: Diagnosis not present

## 2023-03-24 MED ORDER — PEGFILGRASTIM-JMDB 6 MG/0.6ML ~~LOC~~ SOSY
6.0000 mg | PREFILLED_SYRINGE | Freq: Once | SUBCUTANEOUS | Status: AC
  Administered 2023-03-24: 6 mg via SUBCUTANEOUS
  Filled 2023-03-24: qty 0.6

## 2023-03-29 ENCOUNTER — Telehealth: Payer: Self-pay | Admitting: *Deleted

## 2023-03-29 ENCOUNTER — Encounter: Payer: Self-pay | Admitting: *Deleted

## 2023-03-29 NOTE — Telephone Encounter (Signed)
Left message for a return phone call regarding questions related to radiation.

## 2023-03-31 ENCOUNTER — Telehealth: Payer: Self-pay | Admitting: *Deleted

## 2023-03-31 ENCOUNTER — Encounter: Payer: Self-pay | Admitting: *Deleted

## 2023-03-31 DIAGNOSIS — Z171 Estrogen receptor negative status [ER-]: Secondary | ICD-10-CM | POA: Insufficient documentation

## 2023-03-31 NOTE — Telephone Encounter (Signed)
Spoke with patient regarding xrt to the left breast due to having lumpectomy.  She has not had xrt to the left side before. Explained that I would let Dr. Basilio Cairo know and someone from her office will call for an appt. Patient verbalized understanding.

## 2023-04-02 NOTE — Progress Notes (Signed)
Patient Care Team: [name], NP as PCP - General (Internal Medicine) Marykay Lex, MD as PCP - Cardiology (Cardiology) Emelia Loron, MD as Consulting Physician (General Surgery) Lonie Peak, MD as Attending Physician (Radiation Oncology) Armbruster, Willaim Rayas, MD as Consulting Physician (Gastroenterology) Pershing Proud, RN as Oncology Nurse Navigator Donnelly Angelica, RN as Oncology Nurse Navigator [name], MD as Consulting Physician (Hematology and Oncology)  DIAGNOSIS: No diagnosis found.  SUMMARY OF ONCOLOGIC HISTORY: Oncology History  Malignant neoplasm of upper-outer quadrant of right breast in female, estrogen receptor negative (HCC)  10/24/2014 Mammogram   Right breast: ill-defined mass in the UOQ   10/24/2014 Breast US   Right breast: irregular hypoechoic taller than wide mass in the 11 o'clock location, 4 cm from the nipple, measuring 0.9 x 1.1 x 1.2 cm. Evaluation of the right axilla is negative for adenopathy.   11/12/2014 Initial Biopsy   Right breast needle core bx: Invasive ductal carcinoma, ER- (0%), PR- (0%), HER2/neu negative, Ki67 19%, grade 2-3.   11/20/2014 Procedure   Genetic testing: BreastNext panel [employer]) reveals no clinically significant variant at ATM, BARD1, BRCA1, BRCA2, BRIP1, CDH1, CHEK2, MRE11A, MUTYH, NBN, NF1, PALB2, PTEN, RAD50, RAD51C, RAD51D, and TP53.   11/21/2014 Breast MRI   Right breast mass measuring up to 1.3 cm without lymphadenopathy or findings to suggest multifocal or multicentric disease. Benign circumscribed oval enhancing mass located posterior medial to the malignancy consistent with fibroadenoma   11/24/2014 Clinical Stage   Stage IA: T1c N0   12/02/2014 - 02/03/2015 Neo-Adjuvant Chemotherapy   Cyclophosphamide and docetaxel x 4 cycles   02/09/2015 Breast MRI   Right breast mass measures 1.0 x 0.7 x 0.5 cm, decreased from 1.4 x 1.4 x 1.0 cm. No additional areas of suspicion in right or left breast.    03/02/2015 Definitive Surgery   Right lumpectomy / SLNB Dwain Sarna): IDC, grade 1, with high grade DCIS spanning 1 cmHER2/neu repeated and remains negative (ratio 1.62). 4 LN removed and negative for malignancy (0/4 LN).   03/02/2015 Pathologic Stage   Stage IA: ypT1b ypN0   04/20/2015 - 05/29/2015 Radiation Therapy   Adjuvant RT Basilio Cairo): Right Breast  50 Gy over 25 fractions. Right Breast boost 10 Gy over 5 fractions. Total dose: 60 Gy.   07/28/2015 Survivorship   Survivorship visit completed and copy of care plan provided to patient.   12/15/2022 Surgery   Left lumpectomy: Grade 3 IDC 2.2 cm, margins negative, ER 0%, PR 0%, HER2 negative, Ki-67 95%, 0/2 lymph nodes negative Right mastectomy: Grade 3 IDC 6.6 cm with DCIS, margins negative, ER 2%, PR 0%, HER2 negative, Ki-67 50%   12/28/2022 Cancer Staging   Staging form: Breast, AJCC 7th Edition - Pathologic: Stage IIB (T3, N0, cM0) - Signed by [name], MD on 12/28/2022 Stage prefix: Initial diagnosis Laterality: Bilateral Histologic grade (G): G3 Paget's disease: Negative Estrogen receptor status: Negative Progesterone receptor status: Negative   Bilateral malignant neoplasm of breast in female [facility])  11/02/2022 Initial Diagnosis   Bilateral malignant neoplasm of breast in female [facility])   01/18/2023 -  Chemotherapy   Patient is on Treatment Plan : BREAST TC q21d       CHIEF COMPLIANT:   INTERVAL HISTORY: Katie Jacobson is a   ALLERGIES:  is allergic to sulfa antibiotics.  MEDICATIONS:  Current Outpatient Medications  Medication Sig Dispense Refill   albuterol (VENTOLIN HFA) 108 (90 Base) MCG/ACT inhaler Inhale 1-2 puffs into the lungs every 6 (  six) hours as needed for wheezing or shortness of breath. 1 each 0   aspirin 81 MG tablet Take 1 tablet (81 mg total) by mouth daily. 30 tablet 0   chlorthalidone (HYGROTON) 25 MG tablet TAKE 1 TABLET(25 MG) BY MOUTH EVERY OTHER DAY (Patient taking differently: Take 25 mg by  mouth every Monday, Wednesday, and Friday.) 45 tablet 1   enalapril (VASOTEC) 20 MG tablet TAKE 1 TABLET(20 MG) BY MOUTH AT BEDTIME (Patient taking differently: Take 20 mg by mouth at bedtime.) 90 tablet 1   fluticasone (FLONASE) 50 MCG/ACT nasal spray Place 2 sprays into both nostrils as needed. (Patient taking differently: Place 2 sprays into both nostrils daily as needed for allergies or rhinitis.) 16 g 0   lidocaine-prilocaine (EMLA) cream Apply to affected area once (Patient taking differently: Apply 1 Application topically once as needed [facility]).) 30 g 3   metoprolol succinate (TOPROL-XL) 100 MG 24 hr tablet Take 1 tablet (100 mg total) by mouth daily. Take with or immediately following a meal. (Patient taking differently: Take 100 mg by mouth at bedtime. Take with or immediately following a meal.) 90 tablet 3   omeprazole (PRILOSEC) 40 MG capsule Take 1 capsule (40 mg total) by mouth daily. 30 capsule 2   ondansetron (ZOFRAN-ODT) 4 MG disintegrating tablet Take 1 tablet (4 mg total) by mouth every 8 (eight) hours as needed for up to 15 doses for nausea or vomiting. 15 tablet 0   promethazine (PHENERGAN) 25 MG suppository Place 1 suppository (25 mg total) rectally every 6 (six) hours as needed for up to 12 days for nausea or vomiting. 12 each 0   triamcinolone ointment (KENALOG) 0.5 % Apply 1 Application topically 2 (two) times daily. 30 g 0   No current facility-administered medications for this visit.    PHYSICAL EXAMINATION: ECOG PERFORMANCE STATUS: {CHL ONC ECOG PS:[id number]}  There were no vitals filed for this visit. There were no vitals filed for this visit.  BREAST:*** No palpable masses or nodules in either right or left breasts. No palpable axillary supraclavicular or infraclavicular adenopathy no breast tenderness or nipple discharge. (exam performed in the presence of a chaperone)  LABORATORY DATA:  I have reviewed the data as listed    Latest Ref Rng & Units 03/22/2023    10:00 AM 03/01/2023   10:56 AM 02/08/2023   11:03 AM  CMP  Glucose 70 - 99 mg/dL 409  90  91   BUN 8 - 23 mg/dL 14  13  14    Creatinine 0.44 - 1.00 mg/dL 8.11  9.14  7.82   Sodium 135 - 145 mmol/L 142  141  141   Potassium 3.5 - 5.1 mmol/L 3.7  3.6  3.9   Chloride 98 - 111 mmol/L 109  108  108   CO2 22 - 32 mmol/L 27  27  28    Calcium 8.9 - 10.3 mg/dL 9.0  8.8  9.0   Total Protein 6.5 - 8.1 g/dL 6.8  6.4  6.8   Total Bilirubin 0.3 - 1.2 mg/dL 0.4  0.4  0.4   Alkaline Phos 38 - 126 U/L 62  59  53   AST 15 - 41 U/L 11  12  14    ALT 0 - 44 U/L 10  13  16      Lab Results  Component Value Date   WBC 6.8 03/22/2023   HGB 11.0 (L) 03/22/2023   HCT 32.9 (L) 03/22/2023   MCV 93.7 03/22/2023  PLT 257 03/22/2023   NEUTROABS 4.9 03/22/2023    ASSESSMENT & PLAN:  No problem-specific Assessment & Plan notes found for this encounter.    No orders of the defined types were placed in this encounter.  The patient has a good understanding of the overall plan. she agrees with it. she will call with any problems that may develop before the next visit here. Total time spent: 30 mins including face to face time and time spent for planning, charting and co-ordination of care   Sherlyn Lick, CMA 04/02/23    I Janan Ridge am acting as a Neurosurgeon for [employer]  ***

## 2023-04-04 ENCOUNTER — Other Ambulatory Visit: Payer: Self-pay

## 2023-04-04 ENCOUNTER — Inpatient Hospital Stay (HOSPITAL_BASED_OUTPATIENT_CLINIC_OR_DEPARTMENT_OTHER): Payer: 59 | Admitting: Hematology and Oncology

## 2023-04-04 VITALS — BP 131/64 | HR 88 | Temp 97.8°F | Resp 18 | Ht 61.0 in | Wt 207.5 lb

## 2023-04-04 DIAGNOSIS — I10 Essential (primary) hypertension: Secondary | ICD-10-CM | POA: Diagnosis not present

## 2023-04-04 DIAGNOSIS — C50411 Malignant neoplasm of upper-outer quadrant of right female breast: Secondary | ICD-10-CM | POA: Diagnosis not present

## 2023-04-04 DIAGNOSIS — Z171 Estrogen receptor negative status [ER-]: Secondary | ICD-10-CM

## 2023-04-04 DIAGNOSIS — R5383 Other fatigue: Secondary | ICD-10-CM | POA: Diagnosis not present

## 2023-04-04 DIAGNOSIS — R112 Nausea with vomiting, unspecified: Secondary | ICD-10-CM | POA: Diagnosis not present

## 2023-04-04 DIAGNOSIS — E785 Hyperlipidemia, unspecified: Secondary | ICD-10-CM | POA: Diagnosis not present

## 2023-04-04 DIAGNOSIS — Z5111 Encounter for antineoplastic chemotherapy: Secondary | ICD-10-CM | POA: Diagnosis not present

## 2023-04-04 DIAGNOSIS — F17210 Nicotine dependence, cigarettes, uncomplicated: Secondary | ICD-10-CM | POA: Diagnosis not present

## 2023-04-04 DIAGNOSIS — E876 Hypokalemia: Secondary | ICD-10-CM | POA: Diagnosis not present

## 2023-04-04 DIAGNOSIS — R202 Paresthesia of skin: Secondary | ICD-10-CM | POA: Diagnosis not present

## 2023-04-04 NOTE — Progress Notes (Signed)
Location of Breast Cancer: Malignant neoplasm of upper-outer quadrant of left breast in female, estrogen receptor negative   Histology per Pathology Report:  12-15-22 FINAL MICROSCOPIC DIAGNOSIS:  A. BREAST, LEFT, LUMPECTOMY: Invasive ductal carcinoma, 2.2 x 2.0 x 1.7 cm, grade III/III Ductal carcinoma in situ: Not identified Margins, invasive: Negative     Closest, invasive: Lateral 5 mm, see also parts D, E and F Margins, DCIS: N/A     Closest, DCIS: N/A Lymphovascular invasion: Not identified Prognostic markers:  ER negative, PR negative, Her2 negative, Ki-67 95% % Other: N/A See oncology table  B. LYMPH NODE, LEFT AXILLARY, SENTINEL, EXCISION: - One lymph node, negative for malignancy (0/1).  C. LYMPH NODE, LEFT AXILLARY, SENTINEL, EXCISION: -  One lymph node, negative for malignancy (0/1).  D. BREAST, LEFT POSTERIOR MARGIN, EXCISION: -  Benign breast tissue, negative for malignancy (15 mm new width margin).  E. BREAST, LEFT MEDIAL MARGIN, EXCISION: -  Benign breast tissue, negative for malignancy (11mm new width margin).  F. BREAST, LEFT INFERIOR MARGIN, EXCISION: -  Benign breast tissue, negative for malignancy (11mm new width margin).  G. BREAST, RIGHT, MASTECTOMY: Invasive ductal carcinoma, 6.6 cm (see description and synoptic report), grade III/III Ductal carcinoma in situ: Solid type with comedonecrosis, nuclear grade 3 of 3 Margins, invasive: Negative     Closest, invasive: 5 mm, deep Margins, DCIS: Negative     Closest, DCIS: Greater than 10 mm Lymphovascular invasion: Not identified Prognostic markers:  ER positive, 2% weak, PR negative, Her2 negative, Ki-67 50% % Other: None See oncology table  ONCOLOGY TABLE:  Part A. through F.  INVASIVE CARCINOMA OF THE BREAST:  Resection  Procedure: Lumpectomy Specimen Laterality: Left Histologic Type: Invasive ductal carcinoma (NOS)/invasive mammary carcinoma (NST) Histologic Grade:      Glandular  (Acinar)/Tubular Differentiation: 2/3      Nuclear Pleomorphism: 3/3      Mitotic Rate: 3/3      Overall Grade: III/III Tumor Size: 22 x 20 x 17 mm Ductal Carcinoma In Situ: Not identified Lymphatic and/or Vascular Invasion: Not identified Treatment Effect in the Breast: No known presurgical therapy Margins: All margins negative for invasive carcinoma in conjunction with parts A, D, E, F.      Distance from Closest Margin (mm): 5 mm      Specify Closest Margin (required only if <52mm): Lateral DCIS Margins: N/A      Distance from Closest Margin (mm): N/A      Specify Closest Margin (required only if <6mm): N/A Regional Lymph Nodes:      Number of Lymph Nodes Examined: 2      Number of Sentinel Nodes Examined: 2      Number of Lymph Nodes with Macrometastases (>2 mm): 0      Number of Lymph Nodes with Micrometastases: 0      Number of Lymph Nodes with Isolated Tumor Cells (=0.2 mm or =200 cells): 0      Size of Largest Metastatic Deposit (mm): N/A      Extranodal Extension: N/A Distant Metastasis:      Distant Site(s) Involved: N/A Breast Biomarker Testing Performed on Previous Biopsy:      Testing Performed on Case Number: [id number] Something Nuclear            Estrogen Receptor: Negative, 0%            Progesterone Receptor: Negative, 0%            HER2: Negative by FISH  Ki-67: 95% Pathologic Stage Classification (pTNM, AJCC 8th Edition): pT2, pN0 Representative Tumor Block: A1 Comment(s): None (v4.5.0.0)  Part G.  INVASIVE CARCINOMA OF THE BREAST:  Resection  Procedure: Mastectomy Specimen Laterality: Right Histologic Type: Invasive ductal carcinoma (NOS)/invasive mammary carcinoma (NST) Histologic Grade:      Glandular (Acinar)/Tubular Differentiation: 3/3      Nuclear Pleomorphism: 3/3      Mitotic Rate: 2/3      Overall Grade: III/III Tumor Size: Interpreted as 66 mm in greatest dimension (heart clip 36 x 29 x 27 mm; ribbon clip 15 x 13 x 11 mm;  15 mm intervening space with section of tissue between masses with invasive ductal carcinoma all with similar morphologies) Ductal Carcinoma In Situ: Focal solid type, nuclear grade 3 of 3, with comedonecrosis Lymphatic and/or Vascular Invasion: Not identified Treatment Effect in the Breast: No known presurgical therapy Margins: All margins negative for invasive carcinoma      Distance from Closest Margin (mm): 5 mm      Specify Closest Margin (required only if <41mm): Deep DCIS Margins: Uninvolved by DCIS      Distance from Closest Margin (mm): >10 mm      Specify Closest Margin (required only if <46mm): N/A Regional Lymph Nodes: N/A; no lymph nodes submitted      Number of Lymph Nodes Examined: N/A      Number of Sentinel Nodes Examined: N/A      Number of Lymph Nodes with Macrometastases (>2 mm): N/A      Number of Lymph Nodes with Micrometastases: N/A      Number of Lymph Nodes with Isolated Tumor Cells (=0.2 mm or =200 cells): N/A      Size of Largest Metastatic Deposit (mm): N/A      Extranodal Extension: N/A Distant Metastasis:      Distant Site(s) Involved: N/A Breast Biomarker Testing Performed on Previous Biopsy:      Testing Performed on Case Number: [id number]            Estrogen Receptor: Positive, 2% weak            Progesterone Receptor: Negative, 0%            HER2: Negative by FISH            Ki-67: 50% Pathologic Stage Classification (pTNM, AJCC 8th Edition): pT3, pNX Representative Tumor Block: G3 Comment(s): None (v4.5.0.0)  Receptor Status: ER(2%), PR (0%), Her2-neu (neg), Ki-67(50%)  Did patient present with symptoms (if so, please note symptoms) or was this found on screening mammography?: screening mammogram  Past/Anticipated interventions by surgeon, if any: 12-15-22 Preoperative diagnosis:  Recurrent right breast cancer Left breast cancer Postoperative diagnosis: same as above Procedure: Right IJ port placement Left breast seed guided  lumpectomy Injection of magtrace bilateral breasts for sentinel node identification Left deep axillary sentinel node biopsy Right mastectomy Surgeon: Dr Harden Mo  Past/Anticipated interventions by medical oncology, if any:  Dr. Pamelia Hoit on 03-01-23 Oncology History  Malignant neoplasm of upper-outer quadrant of right breast in female, estrogen receptor negative (HCC)  10/24/2014 Mammogram    Right breast: ill-defined mass in the UOQ    10/24/2014 Breast US    Right breast: irregular hypoechoic taller than wide mass in the 11 o'clock location, 4 cm from the nipple, measuring 0.9 x 1.1 x 1.2 cm. Evaluation of the right axilla is negative for adenopathy.    11/12/2014 Initial Biopsy    Right breast needle core bx: Invasive ductal  carcinoma, ER- (0%), PR- (0%), HER2/neu negative, Ki67 19%, grade 2-3.    11/20/2014 Procedure    Genetic testing: BreastNext panel [employer]) reveals no clinically significant variant at ATM, BARD1, BRCA1, BRCA2, BRIP1, CDH1, CHEK2, MRE11A, MUTYH, NBN, NF1, PALB2, PTEN, RAD50, RAD51C, RAD51D, and TP53.    11/21/2014 Breast MRI    Right breast mass measuring up to 1.3 cm without lymphadenopathy or findings to suggest multifocal or multicentric disease. Benign circumscribed oval enhancing mass located posterior medial to the malignancy consistent with fibroadenoma    11/24/2014 Clinical Stage    Stage IA: T1c N0    12/02/2014 - 02/03/2015 Neo-Adjuvant Chemotherapy    Cyclophosphamide and docetaxel x 4 cycles    02/09/2015 Breast MRI    Right breast mass measures 1.0 x 0.7 x 0.5 cm, decreased from 1.4 x 1.4 x 1.0 cm. No additional areas of suspicion in right or left breast.    03/02/2015 Definitive Surgery    Right lumpectomy / SLNB Dwain Sarna): IDC, grade 1, with high grade DCIS spanning 1 cmHER2/neu repeated and remains negative (ratio 1.62). 4 LN removed and negative for malignancy (0/4 LN).    03/02/2015 Pathologic Stage    Stage IA: ypT1b ypN0    04/20/2015 -  05/29/2015 Radiation Therapy    Adjuvant RT Basilio Cairo): Right Breast  50 Gy over 25 fractions. Right Breast boost 10 Gy over 5 fractions. Total dose: 60 Gy.    07/28/2015 Survivorship    Survivorship visit completed and copy of care plan provided to patient.    12/15/2022 Surgery    Left lumpectomy: Grade 3 IDC 2.2 cm, margins negative, ER 0%, PR 0%, HER2 negative, Ki-67 95%, 0/2 lymph nodes negative Right mastectomy: Grade 3 IDC 6.6 cm with DCIS, margins negative, ER 2%, PR 0%, HER2 negative, Ki-67 50%    12/28/2022 Cancer Staging    Staging form: Breast, AJCC 7th Edition - Pathologic: Stage IIB (T3, N0, cM0) - Signed by [name], MD on 12/28/2022 Stage prefix: Initial diagnosis Laterality: Bilateral Histologic grade (G): G3 Paget's disease: Negative Estrogen receptor status: Negative Progesterone receptor status: Negative    Bilateral malignant neoplasm of breast in female [facility])  11/02/2022 Initial Diagnosis    Bilateral malignant neoplasm of breast in female [facility])    01/18/2023 -  Chemotherapy    Patient is on Treatment Plan : BREAST TC q21d      ASSESSMENT & PLAN:  Malignant neoplasm of upper-outer quadrant of right breast in female, estrogen receptor negative (HCC) 11/12/2014: Right breast biopsy T1 cN0 stage Ia triple negative IDC Ki-67 19% 11/12/2014-02/03/2015: TC x 4 neoadjuvant chemotherapy, genetics negative 03/02/2015: Right lumpectomy: Grade 1 IDC negative margins triple negative August 2016: Completed radiation   November 2023: Asymmetry left breast and a possible mass (0.8 cm) and asymmetry in the right breast (1.7 cm and 1 cm) Right breast biopsy: 12:00: Grade 2 IDC ER 2%, PR 0%, Ki-67 50%, HER2 negative 2+ by IHC, 1230: Grade 2 IDC   left breast biopsy 1:00: Grade 2 IDC, ER 0%, PR 0%, Ki-67 95%, HER2 2+ , FISH: Negative   CT CAP and bone scan: 10/24/2022: No evidence of metastatic disease.   Treatment plan: 12/15/2022:Left lumpectomy: Grade 3 IDC 2.2 cm, margins  negative, ER 0%, PR 0%, HER2 negative, Ki-67 95%, 0/2 lymph nodes negative Right mastectomy: Grade 3 IDC 6.6 cm with DCIS, margins negative, ER 2%, PR 0%, HER2 negative, Ki-67 50% Adjuvant chemotherapy: With Taxotere and Cytoxan every 3 weeks x 4 -----------------------------------------------------------------------------------------------------------------------------------  Current treatment: Taxotere Cytoxan cycle 3 Chemo toxicities: Profound nausea and vomiting: Hospitalization 01/26/2023-01/29/2023: Reduce the dosage of Taxotere and Cytoxan for today.  I added Emend to her regimen.  She tolerated this much better this time around. Generalized fatigue and weakness Shortness of breath to minimal exertion     Right neck swelling: Ultrasound negative for DVT Return to clinic in 3 weeks for final cycle of chemo.  Lymphedema issues, if any:  right arm swelling, cannot afford physical therapy cost right now due to now insurance  Pain issues, if any:  none to report  SAFETY ISSUES: Prior radiation? Yes, 2016 left side breast cancer Pacemaker/ICD? no Possible current pregnancy?no Is the patient on methotrexate? no  Current Complaints / other details:  Overall wants to know plan. She is already so tired daily per her report. Rn encouraged pt to rest when needed and provided strategies for fatigue management.

## 2023-04-04 NOTE — Assessment & Plan Note (Signed)
11/12/2014: Right breast biopsy T1 cN0 stage Ia triple negative IDC Ki-67 19% 11/12/2014-02/03/2015: TC x 4 neoadjuvant chemotherapy, genetics negative 03/02/2015: Right lumpectomy: Grade 1 IDC negative margins triple negative August 2016: Completed radiation   November 2023: Asymmetry left breast and a possible mass (0.8 cm) and asymmetry in the right breast (1.7 cm and 1 cm) Right breast biopsy: 12:00: Grade 2 IDC ER 2%, PR 0%, Ki-67 50%, HER2 negative 2+ by IHC, 1230: Grade 2 IDC   left breast biopsy 1:00: Grade 2 IDC, ER 0%, PR 0%, Ki-67 95%, HER2 2+ , FISH: Negative   CT CAP and bone scan: 10/24/2022: No evidence of metastatic disease.   Treatment plan: 12/15/2022:Left lumpectomy: Grade 3 IDC 2.2 cm, margins negative, ER 0%, PR 0%, HER2 negative, Ki-67 95%, 0/2 lymph nodes negative Right mastectomy: Grade 3 IDC 6.6 cm with DCIS, margins negative, ER 2%, PR 0%, HER2 negative, Ki-67 50% Adjuvant chemotherapy: With Taxotere and Cytoxan every 3 weeks x 4 completed 03/22/2023 Followed by adjuvant radiation to left breast ----------------------------------------------------------------------------------------------------------------------------------- There is no role of antiestrogen therapy since she is ER/PR negative. However since she had left lumpectomy she will still benefit from radiation to the left breast.  Return to clinic in 3 months for survivorship care plan visit

## 2023-04-05 ENCOUNTER — Telehealth: Payer: Self-pay | Admitting: Hematology and Oncology

## 2023-04-05 NOTE — Telephone Encounter (Signed)
Scheduled appointment per 6/25 los. Left voicemail.  

## 2023-04-06 ENCOUNTER — Other Ambulatory Visit: Payer: Self-pay

## 2023-04-07 ENCOUNTER — Encounter: Payer: Self-pay | Admitting: *Deleted

## 2023-04-10 ENCOUNTER — Telehealth: Payer: Self-pay

## 2023-04-10 ENCOUNTER — Encounter: Payer: Self-pay | Admitting: Radiation Oncology

## 2023-04-10 ENCOUNTER — Other Ambulatory Visit: Payer: Self-pay

## 2023-04-10 DIAGNOSIS — C50411 Malignant neoplasm of upper-outer quadrant of right female breast: Secondary | ICD-10-CM

## 2023-04-10 NOTE — Progress Notes (Incomplete)
Radiation Oncology         [phone] ________________________________  Initial Outpatient Consultation  Name: Katie Jacobson MRN: [id number]  Date: 04/11/2023  DOB: 1955/10/31  CC:O'Sullivan, Efraim Kaufmann, NP  [name], MD   REFERRING PHYSICIAN: [name], MD  DIAGNOSIS:    ICD-10-CM   1. Malignant neoplasm of upper-outer quadrant of left breast in female, estrogen receptor negative (HCC)  C50.412    Z17.1     2. Malignant neoplasm of upper-outer quadrant of right breast in female, estrogen receptor negative (HCC)  C50.411    Z17.1        Cancer Staging  Malignant neoplasm of upper-outer quadrant of left breast in female, estrogen receptor negative (HCC) Staging form: Breast, AJCC 8th Edition - Pathologic stage from 04/11/2023: Stage IIA (pT2, pN0, cM0, G3, ER-, PR-, HER2-) - Signed by Lonie Peak, MD on 04/11/2023 Stage prefix: Initial diagnosis Histologic grading system: 3 grade system  Malignant neoplasm of upper-outer quadrant of right breast in female, estrogen receptor negative (HCC) Staging form: Breast, AJCC 7th Edition - Clinical stage from 11/19/2014: Stage IA (T1c, N0, M0) - Unsigned Staged by: Pathologist and managing physician Laterality: Right Stage used in treatment planning: Yes National guidelines used in treatment planning: Yes Type of national guideline used in treatment planning: NCCN - Pathologic stage from 03/04/2015: Stage IA (yT1b, N0, cM0) - Signed by [name], MD on 03/16/2015 Staged by: Pathologist Stage prefix: Post-therapy Laterality: Right Stage used in treatment planning: Yes National guidelines used in treatment planning: Yes Type of national guideline used in treatment planning: NCCN Staging comments: Staged on final lumpectomy specimen by Dr. Raynald Blend - Pathologic: Stage IIB (T3, N0, cM0) - Signed by [name], MD on 12/28/2022 Stage prefix: Initial diagnosis Laterality: Bilateral Histologic grade (G): G3 Paget's disease:  Negative  Bilateral breast cancer with recurrence in the right breast diagnosed in December 2023 - S/p right breast mastectomy, left lumpectomy, Left axillary SLN evaluation, and adjuvant chemotherapy:  -- Left Breast UOQ Invasive Ductal Carcinoma, ER- / PR- / Her2-, Grade 3 -- Right Breast UQ, Invasive Ductal Carcinoma with high-grade DCIS, ER+ (weak) / PR- / Her2-, Grade 3  History of Stage IA Invasive ductal carcinoma of the right breast diagnosed in February 2016, ER- / PR- / Her2-, Grade 1, with an MIB-1 of 19%: s/p neoadjuvant chemotherapy, right lumpectomy w/ SLN biopsies, followed by adjuvant radiation completed in August 2016    CHIEF COMPLAINT: Here to discuss management of right and left breast cancer  HISTORY OF PRESENT ILLNESS::Katie Jacobson is a 67 y.o. female who is known to me for her history of right breast cancer diagnosed in February 2016, s/p: neoadjuvant chemotherapy, right lumpectomy w/ SLN biopsies, followed by adjuvant radiation completed in August 2016. She was last seen here on 07/03/2015 for a one month follow-up of radiation to the right breast. She presents today for consideration of radiation therapy in management of her recently diagnosed left breast cancer. (Of note: The patient presented for a bilateral screening mammogram on 08/11/21 which showed a possibly abnormality in the right breast. Diagnostic imaging showed a persistent abnormality in the lower outer right breast. This was biopsied and came back negative for malignancy). Her history pertaining to her recent recurrence of breast cancer is detailed as follows.   The patient presented with bilateral breast abnormalities on the following imaging: bilateral screening mammogram on the date of 08/30/23. No symptoms, if any, were reported at that time. Bilateral diagnostic mammogram and  bilateral breast ultrasound on 09/19/22 further revealed: an irregular highly suspicious 1.7 cm mass in the posterior upper right  breast in the lumpectomy bed; a suspicious subtle 1 cm mass in the 12 o'clock right breast, 1 cmfn; and a suspicious 8 mm mass in the 1 o'clock left breast, located 3 cmfn. No evidence of lymphadenopathy was appreciated in either axilla.   Biopsies performed on 09/30/22 are detailed as follows:  -- Biopsy of the 12 o'clock right breast grade 2 invasive ductal carcinoma measuring 8 mm in the greatest linear extent of the sample.  ER status: 2% positive with weak staining intensity; PR status negative; Proliferation marker Ki67 at 50%; Her2 status negative; Grade 2. -- Biopsy of the 12:30 o'clock right breast showed grade 2 invasive ductal carcinoma measuring 1.2 cm in the greatest linear extent of the sample.  -- Biopsy of the 1 o'clock left breast showed grade 2 invasive ductal carcinoma measuring 1.1 cm in the greatest linear extent of the sample. ER status: negative; PR status negative; Proliferation marker Ki67 at 95%; Her2 status negative; Grade 2.  CT CAP performed on 10/24/22 demonstrated an nodule in the upper inner right breast and post operative changes in the right breast and axilla. CT otherwise showed no evidence of metastatic disease in the chest, abdomen, or pelvis.   Whole body bone scan on 10/27/22 showed a single tiny focus of abnormal tracer uptake vs artifact at the posterior upper right rib/ approximately posterior fourth rib. No other scintigraphic abnormalities were demonstrated.   She was accordingly referred back to Dr. Dwain Sarna, and opted to proceed with a right breast mastectomy, left lumpectomy, and left axillary SLN evaluation (along with port placement) on 12/15/22. Pathology from the procedure revealed:  -- Left lumpectomy: tumor the size of 2.2 cm; histology of grade 3 invasive ductal carcinoma; all margins negative for DCIS; margin status to invasive disease of 5 mm from the lateral margin; nodal status of 2/2 left axillary sentinel lymph node excisions negative for  carcinoma. ER status negative; PR status negative; Proliferation marker Ki67 at 95%; Her2 status negative.  -- Right mastectomy: tumor the size of 6.6 cm; histology of grade 3 invasive ductal carcinoma with high-grade DCIS; all margins negative for invasive and in situ carcinoma; margin status to invasive disease of 5 mm from the deep margin; margin status to in situ disease of greater than 10 mm from the closest margin; no lymph nodes were examined. ER status 2% positive with weak staining intensity; PR status negative; Proliferation marker Ki67 at 50%; Her2 status negative; grade 3.   Chemotherapy (dates and results as follows): She has been treated with adjuvant chemotherapy consisting of TC x 4 cycles from 01/18/23 through 03/22/23 under the care of Dr. Pamelia Hoit.  Chemo toxicities encountered by the patient throughout the course of systemic treatment included: profound nausea and vomiting (see hospital encounter detailed below); diarrhea; fatigue; and grade 1 intermittent paresthesias without motor or sensory deficits.  -- The patient was hospitalized from 01/26/23 through 01/29/23 due to nausea and vomiting from systemic treatment. Prior to being sent to the ED, the patient was given IV fluids and Zofran on 04/16 per oncology. CT AP performed while inpatient showed no acute findings. Hospital course included IV fluids and Zofran with an n.p.o. diet. Her symptoms improved and she was able to tolerate a diet prior to discharge.  Her symptoms ultimately prompted a dose reduction in TC, and emend was added to her anti-emetic regimen.   Since  completing chemotherapy, the patient has had some increasing fatigue and exhaustion. She also recently endorsed some neuropathy (tingling in the tips of her fingers and toes) during a visit with Dr. Pamelia Hoit on 04/04/23. Dr. Pamelia Hoit will continue to monitor this for now. She will return to Dr. Pamelia Hoit in 3 months to review her survivorship care plan.   Again, there is no role  for antiestrogen therapy due to her negative ER status. There is also no role for radiation therapy to the right breast given her prior history of radiation s/p right mastectomy.   Of note: the patient presented to general surgery on 03/27/23 for evaluation of foul odor and drainage coming from the right mastectomy site. She apparently had clear pink fluid draining from the area for a few weeks, which eventually changed to a more cream-like color. Other than drainage, no signs of infection were appreciated. Some skin irritation and hyperpigmentation was noted, which were likely due to the tape used to hold down her dressing.  She was advised to place a 4 x 4 gauze over the opening rather than Telfa to hopefully absorb the drainage better. She will return to the surgeon in a couple weeks.   Patient is present with her supportive husband today. She reports to be feeling quite fatigued since her chemotherapy treatment. She continues to do her daily activities at home with the help of her husband. She is experiencing some right arm lymphedema that she has stopped seeing PT for due to high co-pays. She is continuing at-home exercises and wears a sleeve for this.   PREVIOUS RADIATION THERAPY: Yes   Diagnosis:  Right breast invasive ductal carcinoma, triple negative, clinical T1cN0M0, ypT1b ypN0    Indication for treatment:  curative      Radiation treatment dates:   04/20/2015-05/29/2015 Site/dose:   1) Right Breast / 50 Gy in 25 fractions 2) Right Breast boost / 10 Gy in 5 fractions Beams/energy:  1) 3D conformal tangents / 10 and 6 MV photons 2) photon boost / 15 and 6 MV photons    PAST MEDICAL HISTORY:  has a past medical history of Allergy, Arthritis, Breast cancer of upper-outer quadrant of right female breast (HCC) (11/14/2014), Heart murmur, History of chemotherapy, History of kidney stones, History of seizures, Hypertension, Mitral valve prolapse (1990), Osteoporosis, Personal history of  chemotherapy, Personal history of radiation therapy, and Seasonal allergies.    PAST SURGICAL HISTORY: Past Surgical History:  Procedure Laterality Date   ABDOMINAL HYSTERECTOMY  1998   partial   APPENDECTOMY  1976   BREAST BIOPSY Right 09/30/2022   [place] RT BREAST BX W LOC DEV 1ST LESION IMG BX SPEC US GUIDE 09/30/2022 [facility]   BREAST BIOPSY Left 09/30/2022   [place] LT BREAST BX W LOC DEV 1ST LESION IMG BX SPEC US GUIDE 09/30/2022 [facility]   BREAST BIOPSY Right 09/30/2022   [place] RT BREAST BX W LOC DEV EA ADD LESION IMG BX SPEC US GUIDE 09/30/2022 [facility]   BREAST BIOPSY  12/14/2022   MM LT RADIOACTIVE SEED LOC MAMMO GUIDE 12/14/2022 [facility]   BREAST LUMPECTOMY Right 02/2015   BREAST LUMPECTOMY WITH RADIOACTIVE SEED AND SENTINEL LYMPH NODE BIOPSY Left 12/15/2022   Procedure: LEFT BREAST LUMPECTOMY WITH RADIOACTIVE SEED AND AXILLARY SENTINEL LYMPH NODE BIOPSY;  Surgeon: Emelia Loron, MD;  Location: [facility] OR;  Service: General;  Laterality: Left;   COLONOSCOPY  09/30/2016   KNEE ARTHROSCOPY Left 03/26/2008   MASTECTOMY W/ SENTINEL NODE BIOPSY Right  12/15/2022   Procedure: RIGHT MASTECTOMY WITH RIGHT AXILLARY SENTINEL LYMPH NODE BIOPSY;  Surgeon: Emelia Loron, MD;  Location: [facility] OR;  Service: General;  Laterality: Right;  180 MIN ROOM 9   PLANTAR'S WART EXCISION Left    x 2   PORT-A-CATH REMOVAL Left 04/06/2015   Procedure: REMOVAL PORT-A-CATH;  Surgeon: Emelia Loron, MD;  Location: [facility];  Service: General;  Laterality: Left;   PORTACATH PLACEMENT N/A 11/27/2014   Procedure: INSERTION PORT-A-CATH;  Surgeon: Emelia Loron, MD;  Location: [facility];  Service: General;  Laterality: N/A;   PORTACATH PLACEMENT Right 12/15/2022   Procedure: INSERTION PORT-A-CATH;  Surgeon: Emelia Loron, MD;  Location: [facility] OR;  Service: General;  Laterality: Right;   RADIOACTIVE SEED GUIDED PARTIAL MASTECTOMY WITH AXILLARY  SENTINEL LYMPH NODE BIOPSY Right 03/02/2015   Procedure: RADIOACTIVE SEED GUIDED RIGHT PARTIAL MASTECTOMY WITH RIGHT AXILLARY SENTINEL LYMPH NODE BIOPSY;  Surgeon: Emelia Loron, MD;  Location: [facility];  Service: General;  Laterality: Right;   SALIVARY STONE REMOVAL Right 1972   TONSILLECTOMY  1970s   TRANSTHORACIC ECHOCARDIOGRAM  01/16/2020   EF 60 to 65%.  GRII DD.  No R WMA.  Normal RV size and function.  Rheumatic mitral valve with moderate thickening-hockey-stick appearing.  Moderate MR with mild MS.  Moderate LA dilation.  (Recommend follow-up in 2 to 3 years)   TRANSTHORACIC ECHOCARDIOGRAM  05/31/2022   Normal LV size and function-EF 60 to 65%.  No RWMA. ??  Normal diastolic parameters but elevated LAP?  Normal RV with normal RVP and RAP.  Manage mitral valve with mild MR and no MS.  Mild to moderate AI with aortic sclerosis but no stenosis.   TUBAL LIGATION  1996    FAMILY HISTORY: family history includes Breast cancer in her cousin; Colon polyps in her daughter; Diabetes in her mother, sister, and sister; Heart attack in her father; Heart disease in her mother; Hypertension in her mother; Kidney disease in her brother; Mental retardation in her brother; Stroke in her mother.  SOCIAL HISTORY:  reports that she has been smoking cigarettes. She has a 21.50 pack-year smoking history. She quit smokeless tobacco use about 6 years ago. She reports current alcohol use of about 5.0 standard drinks of alcohol per week. She reports that she does not use drugs.  ALLERGIES: Sulfa antibiotics  MEDICATIONS:  Current Outpatient Medications  Medication Sig Dispense Refill   albuterol (VENTOLIN HFA) 108 (90 Base) MCG/ACT inhaler Inhale 1-2 puffs into the lungs every 6 (six) hours as needed for wheezing or shortness of breath. 1 each 0   aspirin 81 MG tablet Take 1 tablet (81 mg total) by mouth daily. 30 tablet 0   chlorthalidone (HYGROTON) 25 MG tablet TAKE 1 TABLET(25 MG) BY MOUTH  EVERY OTHER DAY (Patient taking differently: Take 25 mg by mouth every Monday, Wednesday, and Friday.) 45 tablet 1   enalapril (VASOTEC) 20 MG tablet TAKE 1 TABLET(20 MG) BY MOUTH AT BEDTIME (Patient taking differently: Take 20 mg by mouth at bedtime.) 90 tablet 1   fluticasone (FLONASE) 50 MCG/ACT nasal spray Place 2 sprays into both nostrils as needed. (Patient taking differently: Place 2 sprays into both nostrils daily as needed for allergies or rhinitis.) 16 g 0   lidocaine-prilocaine (EMLA) cream Apply to affected area once (Patient taking differently: Apply 1 Application topically once as needed [facility]).) 30 g 3   metoprolol succinate (TOPROL-XL) 100 MG 24 hr tablet Take 1 tablet (100  mg total) by mouth daily. Take with or immediately following a meal. (Patient taking differently: Take 100 mg by mouth at bedtime. Take with or immediately following a meal.) 90 tablet 3   ondansetron (ZOFRAN-ODT) 4 MG disintegrating tablet Take 1 tablet (4 mg total) by mouth every 8 (eight) hours as needed for up to 15 doses for nausea or vomiting. 15 tablet 0   omeprazole (PRILOSEC) 40 MG capsule Take 1 capsule (40 mg total) by mouth daily. (Patient not taking: Reported on 04/10/2023) 30 capsule 2   promethazine (PHENERGAN) 25 MG suppository Place 1 suppository (25 mg total) rectally every 6 (six) hours as needed for up to 12 days for nausea or vomiting. 12 each 0   triamcinolone ointment (KENALOG) 0.5 % Apply 1 Application topically 2 (two) times daily. (Patient not taking: Reported on 04/10/2023) 30 g 0   No current facility-administered medications for this encounter.    REVIEW OF SYSTEMS: As above in HPI.   PHYSICAL EXAM:  height is 5\' 1"  (1.549 m) and weight is 212 lb 12.8 oz (96.5 kg). Her temperature is 97.6 F (36.4 C). Her blood pressure is 124/58 (abnormal) and her pulse is 74. Her respiration is 20 and oxygen saturation is 100%.   General: Alert and oriented, in no acute distress HEENT: Head is  normocephalic. Extraocular movements are intact. Oropharynx is clear. Neck: Neck is supple, no palpable cervical or supraclavicular lymphadenopathy. Heart: Regular in rate and rhythm with no murmurs, rubs, or gallops. Chest: Clear to auscultation bilaterally, with no rhonchi, wheezes, or rales. Abdomen: Soft, nontender, nondistended, with no rigidity or guarding. Extremities: Edema of right arm, wearing sleeve.  Able to raise arms overhead. Lymphatics: see Neck Exam Skin: No concerning lesions. Musculoskeletal: symmetric strength and muscle tone throughout. Neurologic: Cranial nerves II through XII are grossly intact. No obvious focalities. Speech is fluent. Coordination is intact. Psychiatric: Judgment and insight are intact. Affect is appropriate. Breasts:  Right chest wall: mastectomy incision covered by bandage (removed). No visualized drainage or signs of infection. Small area of opening, 1cm, at mastectomy incision.  Left breast: Well healed lumpectomy and nodal excision scars. Skin edges are well approximated with no signs of infection. Small amount of scar tissue palpated beneath the lumpectomy scar.    ECOG = 1  0 - Asymptomatic (Fully active, able to carry on all predisease activities without restriction)  1 - Symptomatic but completely ambulatory (Restricted in physically strenuous activity but ambulatory and able to carry out work of a light or sedentary nature. For example, light housework, office work)  2 - Symptomatic, <50% in bed during the day (Ambulatory and capable of all self care but unable to carry out any work activities. Up and about more than 50% of waking hours)  3 - Symptomatic, >50% in bed, but not bedbound (Capable of only limited self-care, confined to bed or chair 50% or more of waking hours)  4 - Bedbound (Completely disabled. Cannot carry on any self-care. Totally confined to bed or chair)  5 - Death   Santiago Glad MM, Creech RH, Tormey DC, et al. [phone]).  "Toxicity and response criteria of the [facility]". Am. Evlyn Clines. Oncol. 5 (6): 649-55   LABORATORY DATA:  Lab Results  Component Value Date   WBC 6.8 03/22/2023   HGB 11.0 (L) 03/22/2023   HCT 32.9 (L) 03/22/2023   MCV 93.7 03/22/2023   PLT 257 03/22/2023   CMP     Component Value Date/Time  NA 142 03/22/2023 1000   NA 139 12/28/2016 1359   K 3.7 03/22/2023 1000   K 3.6 12/28/2016 1359   CL 109 03/22/2023 1000   CO2 27 03/22/2023 1000   CO2 25 12/28/2016 1359   GLUCOSE 109 (H) 03/22/2023 1000   GLUCOSE 85 12/28/2016 1359   BUN 14 03/22/2023 1000   BUN 15.6 12/28/2016 1359   CREATININE 0.74 03/22/2023 1000   CREATININE 0.90 01/28/2022 1509   CREATININE 0.9 12/28/2016 1359   CALCIUM 9.0 03/22/2023 1000   CALCIUM 9.7 12/28/2016 1359   PROT 6.8 03/22/2023 1000   PROT 7.5 12/28/2016 1359   ALBUMIN 3.4 (L) 03/22/2023 1000   ALBUMIN 3.7 12/28/2016 1359   AST 11 (L) 03/22/2023 1000   AST 14 12/28/2016 1359   ALT 10 03/22/2023 1000   ALT 17 12/28/2016 1359   ALKPHOS 62 03/22/2023 1000   ALKPHOS 86 12/28/2016 1359   BILITOT 0.4 03/22/2023 1000   BILITOT 0.35 12/28/2016 1359   GFRNONAA >60 03/22/2023 1000   GFRNONAA 70 09/02/2013 1128   GFRAA >60 12/28/2017 1342   GFRAA 81 09/02/2013 1128         RADIOGRAPHY: As above    IMPRESSION/PLAN: Bilateral breast cancer with recurrence in the right breast diagnosed in December 2023 - S/p right breast mastectomy, left lumpectomy, Left axillary SLN evaluation, and adjuvant chemotherapy:  -- Left Breast UOQ Invasive Ductal Carcinoma, ER- / PR- / Her2-, Grade 3 /// pathologic T2 N0 -- Right Breast UOQ, Invasive Ductal Carcinoma with high-grade DCIS, ER+ (weak) / PR- / Her2-, Grade 3 /// pathologic T3 N0  It was a pleasure seeing this patient again today. She is healing well from her left lumpectomy and continues to heal from her right mastectomy. Given her large tumor size, negative estrogen, and her high  grade tumor, patient is at a high risk for disease recurrence after her right mastectomy. Therefore, radiation is recommended to the right chest wall /right regional nodes and left breast tissue. We discussed the risks, benefits, and side effects of radiotherapy.   We discussed that radiation would take approximately 5.5 weeks after chemotherapy to complete and that I would give the patient 2 more weeks to heal before starting treatment planning. Patient completed chemotherapy on 03/22/23. She is having residual drainage from her left mastectomy scar. She has a general surgery appointment in mid-July for a reassessment. We will ensure both incision sites are healed before starting radiation treatment.  I will schedule her for treatment planning after her appointment with Dr. Dwain Sarna on July 15.  We spoke about acute effects including skin irritation and fatigue as well as much less common late effects including internal organ injury or irritation. We spoke about the latest technology that is used to minimize the risk of late effects for patients undergoing radiotherapy to the breast or chest wall. No guarantees of treatment were given. I reviewed the logistics, benefits, risks, and potential side effects of this treatment in detail. Risks may include but not necessary be limited to acute and late injury tissue in the radiation fields such as skin irritation (change in color/pigmentation, itching, dryness, pain, peeling). She may experience fatigue. We also discussed possible risk of long term cosmetic changes or scar tissue. There is also a smaller risk for lung toxicity, cardiac toxicity, brachial plexopathy, lymphedema, musculoskeletal changes, rib fragility or induction of a second malignancy, late chronic non-healing soft tissue wound.   She understands there are heightened risks of side effects due  to reirradiation of the right chest wall but fortunately she has had 8 years since previous treatment and I  am confident that the potential benefits of reirradiation of the right chest wall outweigh potential risks, given the stiffness of her disease.  The patient asked good questions which I answered to her satisfaction. She is enthusiastic about proceeding with treatment. A consent form has been  signed and placed in her chart.  The patient is enthusiastic about proceeding with treatment. I look forward to participating in the patient's care.     Patient is meeting with social work to help facilitate a return to physical therapy. Patient was educated on the importance of at-home exercises.  On date of service, in total, I spent 60 minutes on this encounter. Patient was seen in person.   __________________________________________   Joyice Faster, PA-C    Lonie Peak, MD  This document serves as a record of services personally performed by Lonie Peak, MD. It was created on her behalf by Neena Rhymes, a trained medical scribe. The creation of this record is based on the scribe's personal observations and the provider's statements to them. This document has been checked and approved by the attending provider.

## 2023-04-10 NOTE — Telephone Encounter (Signed)
Rn left second message for pt in an attempt to obtain pre consult information. Nursing staff will obtain this information in clinic tomorrow if pt not able to call back today.

## 2023-04-10 NOTE — Telephone Encounter (Signed)
Rn left voicemail for pt after failed attempt to reach pt to gather pre consult information. Rn will attempt to call pt back later this evening.

## 2023-04-10 NOTE — Telephone Encounter (Signed)
Rn called pt to obtain meaningful use and nurse evaluation information. Note routed to Dr. Basilio Cairo and Quitman Livings PA-C for review.

## 2023-04-10 NOTE — Progress Notes (Signed)
Social work consult ordered for Physical therapy needs. Pt cannot afford current high co pays for this service right now.

## 2023-04-11 ENCOUNTER — Ambulatory Visit
Admission: RE | Admit: 2023-04-11 | Discharge: 2023-04-11 | Disposition: A | Discharge status: Home / Self Care | Payer: 59 | Source: Ambulatory Visit | Attending: Radiation Oncology | Admitting: Radiation Oncology

## 2023-04-11 ENCOUNTER — Telehealth: Payer: Self-pay | Admitting: Licensed Clinical Social Worker

## 2023-04-11 ENCOUNTER — Inpatient Hospital Stay: Payer: 59 | Attending: Hematology and Oncology | Admitting: Licensed Clinical Social Worker

## 2023-04-11 ENCOUNTER — Encounter: Payer: Self-pay | Admitting: Radiation Oncology

## 2023-04-11 ENCOUNTER — Other Ambulatory Visit: Payer: Self-pay

## 2023-04-11 VITALS — BP 124/58 | HR 74 | Temp 97.6°F | Resp 20 | Ht 61.0 in | Wt 212.8 lb

## 2023-04-11 DIAGNOSIS — Z171 Estrogen receptor negative status [ER-]: Secondary | ICD-10-CM | POA: Diagnosis not present

## 2023-04-11 DIAGNOSIS — C50411 Malignant neoplasm of upper-outer quadrant of right female breast: Secondary | ICD-10-CM | POA: Insufficient documentation

## 2023-04-11 DIAGNOSIS — R5383 Other fatigue: Secondary | ICD-10-CM | POA: Diagnosis not present

## 2023-04-11 DIAGNOSIS — R011 Cardiac murmur, unspecified: Secondary | ICD-10-CM | POA: Diagnosis not present

## 2023-04-11 DIAGNOSIS — F17210 Nicotine dependence, cigarettes, uncomplicated: Secondary | ICD-10-CM | POA: Diagnosis not present

## 2023-04-11 DIAGNOSIS — Z79899 Other long term (current) drug therapy: Secondary | ICD-10-CM | POA: Insufficient documentation

## 2023-04-11 DIAGNOSIS — I341 Nonrheumatic mitral (valve) prolapse: Secondary | ICD-10-CM | POA: Insufficient documentation

## 2023-04-11 DIAGNOSIS — I10 Essential (primary) hypertension: Secondary | ICD-10-CM | POA: Insufficient documentation

## 2023-04-11 DIAGNOSIS — Z803 Family history of malignant neoplasm of breast: Secondary | ICD-10-CM | POA: Diagnosis not present

## 2023-04-11 DIAGNOSIS — C50412 Malignant neoplasm of upper-outer quadrant of left female breast: Secondary | ICD-10-CM | POA: Diagnosis not present

## 2023-04-11 DIAGNOSIS — I890 Lymphedema, not elsewhere classified: Secondary | ICD-10-CM | POA: Diagnosis not present

## 2023-04-11 DIAGNOSIS — Z923 Personal history of irradiation: Secondary | ICD-10-CM | POA: Insufficient documentation

## 2023-04-11 DIAGNOSIS — Z87442 Personal history of urinary calculi: Secondary | ICD-10-CM | POA: Diagnosis not present

## 2023-04-11 DIAGNOSIS — Z9221 Personal history of antineoplastic chemotherapy: Secondary | ICD-10-CM | POA: Insufficient documentation

## 2023-04-11 DIAGNOSIS — Z7982 Long term (current) use of aspirin: Secondary | ICD-10-CM | POA: Diagnosis not present

## 2023-04-11 NOTE — Telephone Encounter (Signed)
[facility] Clinical Social Work  Clinical Social Work was referred by nurse for assessment of psychosocial needs related to insurance.  Clinical Social Worker attempted to contact patient by phone to offer support and assess for needs.   No answer. Left VM with direct contact information.     Kaladin Noseworthy E Doye Montilla, LCSW  Clinical Social Worker [employer]

## 2023-04-11 NOTE — Progress Notes (Signed)
[facility] Clinical Social Work  Clinical Social Work was referred by nurse for assessment of psychosocial needs.  Clinical Social Worker contacted patient by phone to offer support and assess for needs.   Patient has a high OOP maximum ($9400 individual) for her insurance and had stopped PT as she was having a $100 copay each visit. She is not sure if she has now met an OOP maximum. Pt did state that in the past, she was offered family planning Medicaid, but did not have use for it. Pt agreed to referral to [facility] with [facility] to determine if she can apply for [facility].  When pt turns 67 in December, she will join a Medicare plan and has reached out to her insurance advisor to determine the best plan for her.  CSW also discussed [employer] which may be able to provide some assistance with medical bills and provided hard copy of the application for pt today. Pt will notify CSW if she has questions or needs help applying.     Alexyia Guarino E Rettie Laird, LCSW  Clinical Social Worker [facility]

## 2023-04-12 DIAGNOSIS — Z171 Estrogen receptor negative status [ER-]: Secondary | ICD-10-CM | POA: Diagnosis not present

## 2023-04-12 DIAGNOSIS — C50412 Malignant neoplasm of upper-outer quadrant of left female breast: Secondary | ICD-10-CM | POA: Diagnosis not present

## 2023-04-12 DIAGNOSIS — C50411 Malignant neoplasm of upper-outer quadrant of right female breast: Secondary | ICD-10-CM | POA: Diagnosis not present

## 2023-04-17 ENCOUNTER — Other Ambulatory Visit: Payer: Self-pay | Admitting: Adult Health

## 2023-04-17 DIAGNOSIS — Z171 Estrogen receptor negative status [ER-]: Secondary | ICD-10-CM

## 2023-04-18 ENCOUNTER — Telehealth: Payer: Self-pay

## 2023-04-18 ENCOUNTER — Encounter: Payer: Self-pay | Admitting: *Deleted

## 2023-04-18 NOTE — Telephone Encounter (Signed)
Attempted to contact patient regarding [facility] Medicaid. Left message on voicemail requesting a return call.  

## 2023-04-25 ENCOUNTER — Ambulatory Visit
Admission: RE | Admit: 2023-04-25 | Discharge: 2023-04-25 | Disposition: A | Discharge status: Home / Self Care | Payer: 59 | Source: Ambulatory Visit | Attending: Radiation Oncology | Admitting: Radiation Oncology

## 2023-04-25 ENCOUNTER — Other Ambulatory Visit: Payer: Self-pay

## 2023-04-25 DIAGNOSIS — Z510 Encounter for antineoplastic radiation therapy: Secondary | ICD-10-CM | POA: Insufficient documentation

## 2023-04-25 DIAGNOSIS — C50411 Malignant neoplasm of upper-outer quadrant of right female breast: Secondary | ICD-10-CM | POA: Insufficient documentation

## 2023-04-25 DIAGNOSIS — Z171 Estrogen receptor negative status [ER-]: Secondary | ICD-10-CM | POA: Insufficient documentation

## 2023-04-25 DIAGNOSIS — C50412 Malignant neoplasm of upper-outer quadrant of left female breast: Secondary | ICD-10-CM | POA: Diagnosis not present

## 2023-05-01 ENCOUNTER — Telehealth: Payer: Self-pay | Admitting: Licensed Clinical Social Worker

## 2023-05-01 DIAGNOSIS — Z171 Estrogen receptor negative status [ER-]: Secondary | ICD-10-CM | POA: Diagnosis not present

## 2023-05-01 DIAGNOSIS — C50412 Malignant neoplasm of upper-outer quadrant of left female breast: Secondary | ICD-10-CM | POA: Diagnosis not present

## 2023-05-01 DIAGNOSIS — Z510 Encounter for antineoplastic radiation therapy: Secondary | ICD-10-CM | POA: Diagnosis not present

## 2023-05-01 DIAGNOSIS — C50411 Malignant neoplasm of upper-outer quadrant of right female breast: Secondary | ICD-10-CM | POA: Diagnosis not present

## 2023-05-01 NOTE — Telephone Encounter (Signed)
TC from pt regarding [employer] application. Reviewed what pt needs to bring in with the application for CSW to then assist in submitting. Pt plans to bring it in later this week.   Jerimey Burridge E Justus Duerr, LCSW

## 2023-05-02 ENCOUNTER — Encounter: Payer: Self-pay | Admitting: *Deleted

## 2023-05-02 ENCOUNTER — Other Ambulatory Visit: Payer: Self-pay

## 2023-05-02 ENCOUNTER — Ambulatory Visit
Admission: RE | Admit: 2023-05-02 | Discharge: 2023-05-02 | Disposition: A | Discharge status: Home / Self Care | Payer: 59 | Source: Ambulatory Visit | Attending: Radiation Oncology | Admitting: Radiation Oncology

## 2023-05-02 DIAGNOSIS — Z171 Estrogen receptor negative status [ER-]: Secondary | ICD-10-CM | POA: Diagnosis not present

## 2023-05-02 DIAGNOSIS — C50412 Malignant neoplasm of upper-outer quadrant of left female breast: Secondary | ICD-10-CM | POA: Diagnosis not present

## 2023-05-02 DIAGNOSIS — Z510 Encounter for antineoplastic radiation therapy: Secondary | ICD-10-CM | POA: Diagnosis not present

## 2023-05-02 DIAGNOSIS — C50411 Malignant neoplasm of upper-outer quadrant of right female breast: Secondary | ICD-10-CM | POA: Diagnosis not present

## 2023-05-02 LAB — RAD ONC ARIA SESSION SUMMARY
Course Elapsed Days: 0
Plan Fractions Treated to Date: 1
Plan Fractions Treated to Date: 1
Plan Prescribed Dose Per Fraction: 1.8 Gy
Plan Prescribed Dose Per Fraction: 2.67 Gy
Plan Total Fractions Prescribed: 14
Plan Total Fractions Prescribed: 15
Plan Total Prescribed Dose: 25.2 Gy
Plan Total Prescribed Dose: 40.05 Gy
Reference Point Dosage Given to Date: 1.8 Gy
Reference Point Dosage Given to Date: 2.67 Gy
Reference Point Session Dosage Given: 1.8 Gy
Reference Point Session Dosage Given: 2.67 Gy
Session Number: 1

## 2023-05-03 ENCOUNTER — Ambulatory Visit
Admission: RE | Admit: 2023-05-03 | Discharge: 2023-05-03 | Disposition: A | Discharge status: Home / Self Care | Payer: 59 | Source: Ambulatory Visit | Attending: Radiation Oncology | Admitting: Radiation Oncology

## 2023-05-03 ENCOUNTER — Inpatient Hospital Stay: Payer: 59 | Admitting: Licensed Clinical Social Worker

## 2023-05-03 ENCOUNTER — Other Ambulatory Visit: Payer: Self-pay

## 2023-05-03 DIAGNOSIS — Z510 Encounter for antineoplastic radiation therapy: Secondary | ICD-10-CM | POA: Diagnosis not present

## 2023-05-03 DIAGNOSIS — Z171 Estrogen receptor negative status [ER-]: Secondary | ICD-10-CM | POA: Diagnosis not present

## 2023-05-03 DIAGNOSIS — C50411 Malignant neoplasm of upper-outer quadrant of right female breast: Secondary | ICD-10-CM | POA: Diagnosis not present

## 2023-05-03 DIAGNOSIS — C50412 Malignant neoplasm of upper-outer quadrant of left female breast: Secondary | ICD-10-CM | POA: Diagnosis not present

## 2023-05-03 LAB — RAD ONC ARIA SESSION SUMMARY
Course Elapsed Days: 1
Plan Fractions Treated to Date: 1
Plan Fractions Treated to Date: 2
Plan Prescribed Dose Per Fraction: 1.8 Gy
Plan Prescribed Dose Per Fraction: 2.67 Gy
Plan Total Fractions Prescribed: 14
Plan Total Fractions Prescribed: 15
Plan Total Prescribed Dose: 25.2 Gy
Plan Total Prescribed Dose: 40.05 Gy
Reference Point Dosage Given to Date: 1.8 Gy
Reference Point Dosage Given to Date: 5.34 Gy
Reference Point Session Dosage Given: 1.8 Gy
Reference Point Session Dosage Given: 2.67 Gy
Session Number: 2

## 2023-05-03 NOTE — Progress Notes (Signed)
[facility] CSW Progress Note  Visual merchandiser met with patient and husband. Pt brought in completed [employer] application and supporting documents. CSW submitted application to [employer] [place] today with supporting medical information.     Shelise Maron E Makenzey Nanni, LCSW Clinical Social Worker [facility]

## 2023-05-04 ENCOUNTER — Other Ambulatory Visit: Payer: Self-pay

## 2023-05-04 ENCOUNTER — Ambulatory Visit
Admission: RE | Admit: 2023-05-04 | Discharge: 2023-05-04 | Disposition: A | Discharge status: Home / Self Care | Payer: 59 | Source: Ambulatory Visit | Attending: Radiation Oncology | Admitting: Radiation Oncology

## 2023-05-04 DIAGNOSIS — C50411 Malignant neoplasm of upper-outer quadrant of right female breast: Secondary | ICD-10-CM | POA: Diagnosis not present

## 2023-05-04 DIAGNOSIS — C50412 Malignant neoplasm of upper-outer quadrant of left female breast: Secondary | ICD-10-CM | POA: Diagnosis not present

## 2023-05-04 DIAGNOSIS — Z510 Encounter for antineoplastic radiation therapy: Secondary | ICD-10-CM | POA: Diagnosis not present

## 2023-05-04 DIAGNOSIS — Z171 Estrogen receptor negative status [ER-]: Secondary | ICD-10-CM | POA: Diagnosis not present

## 2023-05-04 LAB — RAD ONC ARIA SESSION SUMMARY
Course Elapsed Days: 2
Plan Fractions Treated to Date: 2
Plan Fractions Treated to Date: 3
Plan Prescribed Dose Per Fraction: 1.8 Gy
Plan Prescribed Dose Per Fraction: 2.67 Gy
Plan Total Fractions Prescribed: 14
Plan Total Fractions Prescribed: 15
Plan Total Prescribed Dose: 25.2 Gy
Plan Total Prescribed Dose: 40.05 Gy
Reference Point Dosage Given to Date: 3.6 Gy
Reference Point Dosage Given to Date: 8.01 Gy
Reference Point Session Dosage Given: 1.8 Gy
Reference Point Session Dosage Given: 2.67 Gy
Session Number: 3

## 2023-05-05 ENCOUNTER — Other Ambulatory Visit: Payer: Self-pay

## 2023-05-05 ENCOUNTER — Ambulatory Visit
Admission: RE | Admit: 2023-05-05 | Discharge: 2023-05-05 | Disposition: A | Discharge status: Home / Self Care | Payer: 59 | Source: Ambulatory Visit | Attending: Radiation Oncology | Admitting: Radiation Oncology

## 2023-05-05 DIAGNOSIS — C50411 Malignant neoplasm of upper-outer quadrant of right female breast: Secondary | ICD-10-CM | POA: Diagnosis not present

## 2023-05-05 DIAGNOSIS — Z171 Estrogen receptor negative status [ER-]: Secondary | ICD-10-CM | POA: Diagnosis not present

## 2023-05-05 DIAGNOSIS — Z510 Encounter for antineoplastic radiation therapy: Secondary | ICD-10-CM | POA: Diagnosis not present

## 2023-05-05 DIAGNOSIS — C50412 Malignant neoplasm of upper-outer quadrant of left female breast: Secondary | ICD-10-CM | POA: Diagnosis not present

## 2023-05-05 LAB — RAD ONC ARIA SESSION SUMMARY
Course Elapsed Days: 3
Plan Fractions Treated to Date: 2
Plan Fractions Treated to Date: 4
Plan Prescribed Dose Per Fraction: 1.8 Gy
Plan Prescribed Dose Per Fraction: 2.67 Gy
Plan Total Fractions Prescribed: 14
Plan Total Fractions Prescribed: 15
Plan Total Prescribed Dose: 25.2 Gy
Plan Total Prescribed Dose: 40.05 Gy
Reference Point Dosage Given to Date: 10.68 Gy
Reference Point Dosage Given to Date: 3.6 Gy
Reference Point Session Dosage Given: 1.8 Gy
Reference Point Session Dosage Given: 2.67 Gy
Session Number: 4

## 2023-05-08 ENCOUNTER — Ambulatory Visit
Admission: RE | Admit: 2023-05-08 | Discharge: 2023-05-08 | Disposition: A | Discharge status: Home / Self Care | Payer: 59 | Source: Ambulatory Visit | Attending: Radiation Oncology | Admitting: Radiation Oncology

## 2023-05-08 ENCOUNTER — Other Ambulatory Visit: Payer: Self-pay

## 2023-05-08 DIAGNOSIS — Z171 Estrogen receptor negative status [ER-]: Secondary | ICD-10-CM | POA: Diagnosis not present

## 2023-05-08 DIAGNOSIS — Z510 Encounter for antineoplastic radiation therapy: Secondary | ICD-10-CM | POA: Diagnosis not present

## 2023-05-08 DIAGNOSIS — C50412 Malignant neoplasm of upper-outer quadrant of left female breast: Secondary | ICD-10-CM | POA: Diagnosis not present

## 2023-05-08 DIAGNOSIS — C50411 Malignant neoplasm of upper-outer quadrant of right female breast: Secondary | ICD-10-CM | POA: Diagnosis not present

## 2023-05-08 LAB — RAD ONC ARIA SESSION SUMMARY
Course Elapsed Days: 6
Plan Fractions Treated to Date: 3
Plan Fractions Treated to Date: 5
Plan Prescribed Dose Per Fraction: 1.8 Gy
Plan Prescribed Dose Per Fraction: 2.67 Gy
Plan Total Fractions Prescribed: 14
Plan Total Fractions Prescribed: 15
Plan Total Prescribed Dose: 25.2 Gy
Plan Total Prescribed Dose: 40.05 Gy
Reference Point Dosage Given to Date: 13.35 Gy
Reference Point Dosage Given to Date: 5.4 Gy
Reference Point Session Dosage Given: 1.8 Gy
Reference Point Session Dosage Given: 2.67 Gy
Session Number: 5

## 2023-05-08 MED ORDER — ALRA NON-METALLIC DEODORANT (RAD-ONC)
1.0000 | Freq: Once | TOPICAL | Status: AC
  Administered 2023-05-08: 1 via TOPICAL

## 2023-05-08 MED ORDER — RADIAPLEXRX EX GEL: Freq: Two times a day (BID) | CUTANEOUS | Status: DC

## 2023-05-08 NOTE — Progress Notes (Signed)
Pt here for patient teaching.    Pt given Radiation and You booklet, Managing Acute Radiation Side Effects for Head and Neck Cancer handout, skin care instructions, Alra deodorant, and Radiaplex gel.    Reviewed areas of pertinence such as fatigue, hair loss, skin changes, breast tenderness, and breast swelling .   Pt able to give teach back of to pat skin and use unscented/gentle soap,apply Radiaplex bid, avoid applying anything to skin within 4 hours of treatment, avoid wearing an under wire bra, and to use an electric razor if they must shave.   Pt verbalizes understanding of information given and will contact nursing with any questions or concerns.    [email or web address]/treatmentinformation/whattoexpect/index

## 2023-05-09 ENCOUNTER — Other Ambulatory Visit: Payer: Self-pay

## 2023-05-09 ENCOUNTER — Ambulatory Visit
Admission: RE | Admit: 2023-05-09 | Discharge: 2023-05-09 | Disposition: A | Discharge status: Home / Self Care | Payer: 59 | Source: Ambulatory Visit | Attending: Radiation Oncology | Admitting: Radiation Oncology

## 2023-05-09 DIAGNOSIS — C50411 Malignant neoplasm of upper-outer quadrant of right female breast: Secondary | ICD-10-CM | POA: Diagnosis not present

## 2023-05-09 DIAGNOSIS — Z171 Estrogen receptor negative status [ER-]: Secondary | ICD-10-CM | POA: Diagnosis not present

## 2023-05-09 DIAGNOSIS — Z510 Encounter for antineoplastic radiation therapy: Secondary | ICD-10-CM | POA: Diagnosis not present

## 2023-05-09 DIAGNOSIS — C50412 Malignant neoplasm of upper-outer quadrant of left female breast: Secondary | ICD-10-CM | POA: Diagnosis not present

## 2023-05-09 LAB — RAD ONC ARIA SESSION SUMMARY
Course Elapsed Days: 7
Plan Fractions Treated to Date: 3
Plan Fractions Treated to Date: 6
Plan Prescribed Dose Per Fraction: 1.8 Gy
Plan Prescribed Dose Per Fraction: 2.67 Gy
Plan Total Fractions Prescribed: 14
Plan Total Fractions Prescribed: 15
Plan Total Prescribed Dose: 25.2 Gy
Plan Total Prescribed Dose: 40.05 Gy
Reference Point Dosage Given to Date: 16.02 Gy
Reference Point Dosage Given to Date: 5.4 Gy
Reference Point Session Dosage Given: 1.8 Gy
Reference Point Session Dosage Given: 2.67 Gy
Session Number: 6

## 2023-05-10 ENCOUNTER — Encounter: Payer: Self-pay | Admitting: Licensed Clinical Social Worker

## 2023-05-10 ENCOUNTER — Other Ambulatory Visit: Payer: Self-pay

## 2023-05-10 ENCOUNTER — Ambulatory Visit
Admission: RE | Admit: 2023-05-10 | Discharge: 2023-05-10 | Disposition: A | Discharge status: Home / Self Care | Payer: 59 | Source: Ambulatory Visit | Attending: Radiation Oncology | Admitting: Radiation Oncology

## 2023-05-10 DIAGNOSIS — Z510 Encounter for antineoplastic radiation therapy: Secondary | ICD-10-CM | POA: Diagnosis not present

## 2023-05-10 DIAGNOSIS — C50411 Malignant neoplasm of upper-outer quadrant of right female breast: Secondary | ICD-10-CM | POA: Diagnosis not present

## 2023-05-10 DIAGNOSIS — C50412 Malignant neoplasm of upper-outer quadrant of left female breast: Secondary | ICD-10-CM | POA: Diagnosis not present

## 2023-05-10 DIAGNOSIS — Z171 Estrogen receptor negative status [ER-]: Secondary | ICD-10-CM | POA: Diagnosis not present

## 2023-05-10 LAB — RAD ONC ARIA SESSION SUMMARY
Course Elapsed Days: 8
Plan Fractions Treated to Date: 4
Plan Fractions Treated to Date: 7
Plan Prescribed Dose Per Fraction: 1.8 Gy
Plan Prescribed Dose Per Fraction: 2.67 Gy
Plan Total Fractions Prescribed: 14
Plan Total Fractions Prescribed: 15
Plan Total Prescribed Dose: 25.2 Gy
Plan Total Prescribed Dose: 40.05 Gy
Reference Point Dosage Given to Date: 18.69 Gy
Reference Point Dosage Given to Date: 7.2 Gy
Reference Point Session Dosage Given: 1.8 Gy
Reference Point Session Dosage Given: 2.67 Gy
Session Number: 7

## 2023-05-10 NOTE — Progress Notes (Signed)
[facility] CSW Progress Note  Clinical Child psychotherapist received notification from [place] with [employer] [place] that patient was approved for assistance. Patient was also notified by [employer].    Neela Zecca E Lorcan Shelp, LCSW Clinical Social Worker [facility]

## 2023-05-11 ENCOUNTER — Other Ambulatory Visit: Payer: Self-pay

## 2023-05-11 ENCOUNTER — Ambulatory Visit
Admission: RE | Admit: 2023-05-11 | Discharge: 2023-05-11 | Disposition: A | Discharge status: Home / Self Care | Payer: 59 | Source: Ambulatory Visit | Attending: Radiation Oncology | Admitting: Radiation Oncology

## 2023-05-11 DIAGNOSIS — N6489 Other specified disorders of breast: Secondary | ICD-10-CM | POA: Diagnosis not present

## 2023-05-11 DIAGNOSIS — C50411 Malignant neoplasm of upper-outer quadrant of right female breast: Secondary | ICD-10-CM | POA: Diagnosis present

## 2023-05-11 DIAGNOSIS — Z882 Allergy status to sulfonamides status: Secondary | ICD-10-CM | POA: Diagnosis not present

## 2023-05-11 DIAGNOSIS — Z79899 Other long term (current) drug therapy: Secondary | ICD-10-CM | POA: Diagnosis not present

## 2023-05-11 DIAGNOSIS — Z510 Encounter for antineoplastic radiation therapy: Secondary | ICD-10-CM | POA: Insufficient documentation

## 2023-05-11 DIAGNOSIS — C50412 Malignant neoplasm of upper-outer quadrant of left female breast: Secondary | ICD-10-CM | POA: Diagnosis present

## 2023-05-11 DIAGNOSIS — Z171 Estrogen receptor negative status [ER-]: Secondary | ICD-10-CM | POA: Diagnosis not present

## 2023-05-11 DIAGNOSIS — R5383 Other fatigue: Secondary | ICD-10-CM | POA: Diagnosis not present

## 2023-05-11 LAB — RAD ONC ARIA SESSION SUMMARY
Course Elapsed Days: 9
Plan Fractions Treated to Date: 4
Plan Fractions Treated to Date: 8
Plan Prescribed Dose Per Fraction: 1.8 Gy
Plan Prescribed Dose Per Fraction: 2.67 Gy
Plan Total Fractions Prescribed: 14
Plan Total Fractions Prescribed: 15
Plan Total Prescribed Dose: 25.2 Gy
Plan Total Prescribed Dose: 40.05 Gy
Reference Point Dosage Given to Date: 21.36 Gy
Reference Point Dosage Given to Date: 7.2 Gy
Reference Point Session Dosage Given: 1.8 Gy
Reference Point Session Dosage Given: 2.67 Gy
Session Number: 8

## 2023-05-12 ENCOUNTER — Other Ambulatory Visit: Payer: Self-pay

## 2023-05-12 ENCOUNTER — Ambulatory Visit
Admission: RE | Admit: 2023-05-12 | Discharge: 2023-05-12 | Disposition: A | Discharge status: Home / Self Care | Payer: 59 | Source: Ambulatory Visit | Attending: Radiation Oncology | Admitting: Radiation Oncology

## 2023-05-12 ENCOUNTER — Telehealth: Payer: Self-pay

## 2023-05-12 DIAGNOSIS — Z510 Encounter for antineoplastic radiation therapy: Secondary | ICD-10-CM | POA: Diagnosis not present

## 2023-05-12 DIAGNOSIS — C50411 Malignant neoplasm of upper-outer quadrant of right female breast: Secondary | ICD-10-CM | POA: Diagnosis not present

## 2023-05-12 DIAGNOSIS — C50412 Malignant neoplasm of upper-outer quadrant of left female breast: Secondary | ICD-10-CM | POA: Diagnosis not present

## 2023-05-12 DIAGNOSIS — Z882 Allergy status to sulfonamides status: Secondary | ICD-10-CM | POA: Diagnosis not present

## 2023-05-12 DIAGNOSIS — R5383 Other fatigue: Secondary | ICD-10-CM | POA: Diagnosis not present

## 2023-05-12 DIAGNOSIS — N6489 Other specified disorders of breast: Secondary | ICD-10-CM | POA: Diagnosis not present

## 2023-05-12 DIAGNOSIS — Z171 Estrogen receptor negative status [ER-]: Secondary | ICD-10-CM | POA: Diagnosis not present

## 2023-05-12 DIAGNOSIS — Z79899 Other long term (current) drug therapy: Secondary | ICD-10-CM | POA: Diagnosis not present

## 2023-05-12 LAB — RAD ONC ARIA SESSION SUMMARY
Course Elapsed Days: 10
Plan Fractions Treated to Date: 5
Plan Fractions Treated to Date: 9
Plan Prescribed Dose Per Fraction: 1.8 Gy
Plan Prescribed Dose Per Fraction: 2.67 Gy
Plan Total Fractions Prescribed: 14
Plan Total Fractions Prescribed: 15
Plan Total Prescribed Dose: 25.2 Gy
Plan Total Prescribed Dose: 40.05 Gy
Reference Point Dosage Given to Date: 24.03 Gy
Reference Point Dosage Given to Date: 9 Gy
Reference Point Session Dosage Given: 1.8 Gy
Reference Point Session Dosage Given: 2.67 Gy
Session Number: 9

## 2023-05-12 NOTE — Telephone Encounter (Signed)
Medicaid application sent to [facility] DSS.

## 2023-05-15 ENCOUNTER — Other Ambulatory Visit: Payer: Self-pay

## 2023-05-15 ENCOUNTER — Ambulatory Visit
Admission: RE | Admit: 2023-05-15 | Discharge: 2023-05-15 | Disposition: A | Discharge status: Home / Self Care | Payer: 59 | Source: Ambulatory Visit | Attending: Radiation Oncology | Admitting: Radiation Oncology

## 2023-05-15 ENCOUNTER — Inpatient Hospital Stay: Payer: 59 | Attending: Hematology and Oncology | Admitting: *Deleted

## 2023-05-15 DIAGNOSIS — Z510 Encounter for antineoplastic radiation therapy: Secondary | ICD-10-CM | POA: Diagnosis not present

## 2023-05-15 DIAGNOSIS — C50411 Malignant neoplasm of upper-outer quadrant of right female breast: Secondary | ICD-10-CM | POA: Diagnosis not present

## 2023-05-15 DIAGNOSIS — R5383 Other fatigue: Secondary | ICD-10-CM | POA: Insufficient documentation

## 2023-05-15 DIAGNOSIS — Z882 Allergy status to sulfonamides status: Secondary | ICD-10-CM | POA: Insufficient documentation

## 2023-05-15 DIAGNOSIS — Z171 Estrogen receptor negative status [ER-]: Secondary | ICD-10-CM | POA: Diagnosis not present

## 2023-05-15 DIAGNOSIS — C50412 Malignant neoplasm of upper-outer quadrant of left female breast: Secondary | ICD-10-CM | POA: Insufficient documentation

## 2023-05-15 DIAGNOSIS — Z79899 Other long term (current) drug therapy: Secondary | ICD-10-CM | POA: Insufficient documentation

## 2023-05-15 DIAGNOSIS — N6489 Other specified disorders of breast: Secondary | ICD-10-CM | POA: Diagnosis not present

## 2023-05-15 LAB — RAD ONC ARIA SESSION SUMMARY
Course Elapsed Days: 13
Plan Fractions Treated to Date: 10
Plan Fractions Treated to Date: 5
Plan Prescribed Dose Per Fraction: 1.8 Gy
Plan Prescribed Dose Per Fraction: 2.67 Gy
Plan Total Fractions Prescribed: 14
Plan Total Fractions Prescribed: 15
Plan Total Prescribed Dose: 25.2 Gy
Plan Total Prescribed Dose: 40.05 Gy
Reference Point Dosage Given to Date: 26.7 Gy
Reference Point Dosage Given to Date: 9 Gy
Reference Point Session Dosage Given: 1.8 Gy
Reference Point Session Dosage Given: 2.67 Gy
Session Number: 10

## 2023-05-15 NOTE — Progress Notes (Signed)
[facility] CSW Progress Note  Clinical Child psychotherapist contacted patient by phone to follow up per Admin L. Gwynn request. Patient had questions about obtaining information for bills for payment for [employer] [facility]. CSW and patient discussed concerns and determined billing team was the best resource to gather requested information. Patient agreed and will f/u with CSW if needed.   Marguerita Merles, LCSWA Clinical Social Worker [facility]

## 2023-05-16 ENCOUNTER — Other Ambulatory Visit: Payer: Self-pay

## 2023-05-16 ENCOUNTER — Ambulatory Visit
Admission: RE | Admit: 2023-05-16 | Discharge: 2023-05-16 | Disposition: A | Discharge status: Home / Self Care | Payer: 59 | Source: Ambulatory Visit | Attending: Radiation Oncology | Admitting: Radiation Oncology

## 2023-05-16 DIAGNOSIS — N6489 Other specified disorders of breast: Secondary | ICD-10-CM | POA: Diagnosis not present

## 2023-05-16 DIAGNOSIS — Z510 Encounter for antineoplastic radiation therapy: Secondary | ICD-10-CM | POA: Diagnosis not present

## 2023-05-16 DIAGNOSIS — Z171 Estrogen receptor negative status [ER-]: Secondary | ICD-10-CM | POA: Diagnosis not present

## 2023-05-16 DIAGNOSIS — Z882 Allergy status to sulfonamides status: Secondary | ICD-10-CM | POA: Diagnosis not present

## 2023-05-16 DIAGNOSIS — Z79899 Other long term (current) drug therapy: Secondary | ICD-10-CM | POA: Diagnosis not present

## 2023-05-16 DIAGNOSIS — C50411 Malignant neoplasm of upper-outer quadrant of right female breast: Secondary | ICD-10-CM | POA: Diagnosis not present

## 2023-05-16 DIAGNOSIS — C50412 Malignant neoplasm of upper-outer quadrant of left female breast: Secondary | ICD-10-CM | POA: Diagnosis not present

## 2023-05-16 DIAGNOSIS — R5383 Other fatigue: Secondary | ICD-10-CM | POA: Diagnosis not present

## 2023-05-16 LAB — RAD ONC ARIA SESSION SUMMARY
Course Elapsed Days: 14
Plan Fractions Treated to Date: 11
Plan Fractions Treated to Date: 6
Plan Prescribed Dose Per Fraction: 1.8 Gy
Plan Prescribed Dose Per Fraction: 2.67 Gy
Plan Total Fractions Prescribed: 14
Plan Total Fractions Prescribed: 15
Plan Total Prescribed Dose: 25.2 Gy
Plan Total Prescribed Dose: 40.05 Gy
Reference Point Dosage Given to Date: 10.8 Gy
Reference Point Dosage Given to Date: 29.37 Gy
Reference Point Session Dosage Given: 1.8 Gy
Reference Point Session Dosage Given: 2.67 Gy
Session Number: 11

## 2023-05-17 ENCOUNTER — Other Ambulatory Visit: Payer: Self-pay

## 2023-05-17 ENCOUNTER — Ambulatory Visit
Admission: RE | Admit: 2023-05-17 | Discharge: 2023-05-17 | Disposition: A | Discharge status: Home / Self Care | Payer: 59 | Source: Ambulatory Visit | Attending: Radiation Oncology | Admitting: Radiation Oncology

## 2023-05-17 ENCOUNTER — Other Ambulatory Visit: Payer: Self-pay | Admitting: Family

## 2023-05-17 DIAGNOSIS — Z171 Estrogen receptor negative status [ER-]: Secondary | ICD-10-CM | POA: Diagnosis not present

## 2023-05-17 DIAGNOSIS — C50411 Malignant neoplasm of upper-outer quadrant of right female breast: Secondary | ICD-10-CM | POA: Diagnosis not present

## 2023-05-17 DIAGNOSIS — Z79899 Other long term (current) drug therapy: Secondary | ICD-10-CM | POA: Diagnosis not present

## 2023-05-17 DIAGNOSIS — N6489 Other specified disorders of breast: Secondary | ICD-10-CM | POA: Diagnosis not present

## 2023-05-17 DIAGNOSIS — C50412 Malignant neoplasm of upper-outer quadrant of left female breast: Secondary | ICD-10-CM | POA: Diagnosis not present

## 2023-05-17 DIAGNOSIS — Z882 Allergy status to sulfonamides status: Secondary | ICD-10-CM | POA: Diagnosis not present

## 2023-05-17 DIAGNOSIS — R5383 Other fatigue: Secondary | ICD-10-CM | POA: Diagnosis not present

## 2023-05-17 DIAGNOSIS — Z510 Encounter for antineoplastic radiation therapy: Secondary | ICD-10-CM | POA: Diagnosis not present

## 2023-05-17 LAB — RAD ONC ARIA SESSION SUMMARY
Course Elapsed Days: 15
Plan Fractions Treated to Date: 12
Plan Fractions Treated to Date: 6
Plan Prescribed Dose Per Fraction: 1.8 Gy
Plan Prescribed Dose Per Fraction: 2.67 Gy
Plan Total Fractions Prescribed: 14
Plan Total Fractions Prescribed: 15
Plan Total Prescribed Dose: 25.2 Gy
Plan Total Prescribed Dose: 40.05 Gy
Reference Point Dosage Given to Date: 10.8 Gy
Reference Point Dosage Given to Date: 32.04 Gy
Reference Point Session Dosage Given: 1.8 Gy
Reference Point Session Dosage Given: 2.67 Gy
Session Number: 12

## 2023-05-18 ENCOUNTER — Other Ambulatory Visit: Payer: Self-pay

## 2023-05-18 ENCOUNTER — Ambulatory Visit: Payer: Self-pay | Admitting: Hematology and Oncology

## 2023-05-18 ENCOUNTER — Ambulatory Visit: Payer: 59

## 2023-05-19 ENCOUNTER — Ambulatory Visit
Admission: RE | Admit: 2023-05-19 | Discharge: 2023-05-19 | Disposition: A | Discharge status: Home / Self Care | Payer: 59 | Source: Ambulatory Visit | Attending: Radiation Oncology | Admitting: Radiation Oncology

## 2023-05-19 ENCOUNTER — Other Ambulatory Visit: Payer: Self-pay

## 2023-05-19 DIAGNOSIS — R5383 Other fatigue: Secondary | ICD-10-CM | POA: Diagnosis not present

## 2023-05-19 DIAGNOSIS — Z510 Encounter for antineoplastic radiation therapy: Secondary | ICD-10-CM | POA: Diagnosis not present

## 2023-05-19 DIAGNOSIS — Z882 Allergy status to sulfonamides status: Secondary | ICD-10-CM | POA: Diagnosis not present

## 2023-05-19 DIAGNOSIS — N6489 Other specified disorders of breast: Secondary | ICD-10-CM | POA: Diagnosis not present

## 2023-05-19 DIAGNOSIS — C50411 Malignant neoplasm of upper-outer quadrant of right female breast: Secondary | ICD-10-CM | POA: Diagnosis not present

## 2023-05-19 DIAGNOSIS — Z171 Estrogen receptor negative status [ER-]: Secondary | ICD-10-CM | POA: Diagnosis not present

## 2023-05-19 DIAGNOSIS — C50412 Malignant neoplasm of upper-outer quadrant of left female breast: Secondary | ICD-10-CM | POA: Diagnosis not present

## 2023-05-19 DIAGNOSIS — Z79899 Other long term (current) drug therapy: Secondary | ICD-10-CM | POA: Diagnosis not present

## 2023-05-19 LAB — RAD ONC ARIA SESSION SUMMARY
Course Elapsed Days: 17
Plan Fractions Treated to Date: 13
Plan Fractions Treated to Date: 7
Plan Prescribed Dose Per Fraction: 1.8 Gy
Plan Prescribed Dose Per Fraction: 2.67 Gy
Plan Total Fractions Prescribed: 14
Plan Total Fractions Prescribed: 15
Plan Total Prescribed Dose: 25.2 Gy
Plan Total Prescribed Dose: 40.05 Gy
Reference Point Dosage Given to Date: 12.6 Gy
Reference Point Dosage Given to Date: 34.71 Gy
Reference Point Session Dosage Given: 1.8 Gy
Reference Point Session Dosage Given: 2.67 Gy
Session Number: 13

## 2023-05-22 ENCOUNTER — Other Ambulatory Visit: Payer: Self-pay

## 2023-05-22 ENCOUNTER — Ambulatory Visit
Admission: RE | Admit: 2023-05-22 | Discharge: 2023-05-22 | Disposition: A | Discharge status: Home / Self Care | Payer: 59 | Source: Ambulatory Visit | Attending: Radiation Oncology | Admitting: Radiation Oncology

## 2023-05-22 ENCOUNTER — Ambulatory Visit: Payer: 59 | Admitting: Radiation Oncology

## 2023-05-22 DIAGNOSIS — N6489 Other specified disorders of breast: Secondary | ICD-10-CM | POA: Diagnosis not present

## 2023-05-22 DIAGNOSIS — R5383 Other fatigue: Secondary | ICD-10-CM | POA: Diagnosis not present

## 2023-05-22 DIAGNOSIS — C50411 Malignant neoplasm of upper-outer quadrant of right female breast: Secondary | ICD-10-CM | POA: Diagnosis not present

## 2023-05-22 DIAGNOSIS — Z79899 Other long term (current) drug therapy: Secondary | ICD-10-CM | POA: Diagnosis not present

## 2023-05-22 DIAGNOSIS — Z882 Allergy status to sulfonamides status: Secondary | ICD-10-CM | POA: Diagnosis not present

## 2023-05-22 DIAGNOSIS — Z171 Estrogen receptor negative status [ER-]: Secondary | ICD-10-CM | POA: Diagnosis not present

## 2023-05-22 DIAGNOSIS — Z510 Encounter for antineoplastic radiation therapy: Secondary | ICD-10-CM | POA: Diagnosis not present

## 2023-05-22 DIAGNOSIS — C50412 Malignant neoplasm of upper-outer quadrant of left female breast: Secondary | ICD-10-CM | POA: Diagnosis not present

## 2023-05-22 LAB — RAD ONC ARIA SESSION SUMMARY
Course Elapsed Days: 20
Plan Fractions Treated to Date: 14
Plan Fractions Treated to Date: 7
Plan Prescribed Dose Per Fraction: 1.8 Gy
Plan Prescribed Dose Per Fraction: 2.67 Gy
Plan Total Fractions Prescribed: 14
Plan Total Fractions Prescribed: 15
Plan Total Prescribed Dose: 25.2 Gy
Plan Total Prescribed Dose: 40.05 Gy
Reference Point Dosage Given to Date: 12.6 Gy
Reference Point Dosage Given to Date: 37.38 Gy
Reference Point Session Dosage Given: 1.8 Gy
Reference Point Session Dosage Given: 2.67 Gy
Session Number: 14

## 2023-05-23 ENCOUNTER — Ambulatory Visit
Admission: RE | Admit: 2023-05-23 | Discharge: 2023-05-23 | Disposition: A | Discharge status: Home / Self Care | Payer: 59 | Source: Ambulatory Visit | Attending: Radiation Oncology | Admitting: Radiation Oncology

## 2023-05-23 ENCOUNTER — Ambulatory Visit: Payer: 59

## 2023-05-23 ENCOUNTER — Other Ambulatory Visit: Payer: Self-pay

## 2023-05-23 DIAGNOSIS — Z882 Allergy status to sulfonamides status: Secondary | ICD-10-CM | POA: Diagnosis not present

## 2023-05-23 DIAGNOSIS — Z171 Estrogen receptor negative status [ER-]: Secondary | ICD-10-CM | POA: Diagnosis not present

## 2023-05-23 DIAGNOSIS — C50412 Malignant neoplasm of upper-outer quadrant of left female breast: Secondary | ICD-10-CM | POA: Diagnosis not present

## 2023-05-23 DIAGNOSIS — Z79899 Other long term (current) drug therapy: Secondary | ICD-10-CM | POA: Diagnosis not present

## 2023-05-23 DIAGNOSIS — R5383 Other fatigue: Secondary | ICD-10-CM | POA: Diagnosis not present

## 2023-05-23 DIAGNOSIS — C50411 Malignant neoplasm of upper-outer quadrant of right female breast: Secondary | ICD-10-CM | POA: Diagnosis not present

## 2023-05-23 DIAGNOSIS — Z510 Encounter for antineoplastic radiation therapy: Secondary | ICD-10-CM | POA: Diagnosis not present

## 2023-05-23 DIAGNOSIS — N6489 Other specified disorders of breast: Secondary | ICD-10-CM | POA: Diagnosis not present

## 2023-05-23 LAB — RAD ONC ARIA SESSION SUMMARY
Course Elapsed Days: 21
Plan Fractions Treated to Date: 15
Plan Fractions Treated to Date: 8
Plan Prescribed Dose Per Fraction: 1.8 Gy
Plan Prescribed Dose Per Fraction: 2.67 Gy
Plan Total Fractions Prescribed: 14
Plan Total Fractions Prescribed: 15
Plan Total Prescribed Dose: 25.2 Gy
Plan Total Prescribed Dose: 40.05 Gy
Reference Point Dosage Given to Date: 14.4 Gy
Reference Point Dosage Given to Date: 40.05 Gy
Reference Point Session Dosage Given: 1.8 Gy
Reference Point Session Dosage Given: 2.67 Gy
Session Number: 15

## 2023-05-24 ENCOUNTER — Other Ambulatory Visit: Payer: Self-pay

## 2023-05-24 ENCOUNTER — Ambulatory Visit: Payer: 59

## 2023-05-24 ENCOUNTER — Ambulatory Visit
Admission: RE | Admit: 2023-05-24 | Discharge: 2023-05-24 | Disposition: A | Discharge status: Home / Self Care | Payer: 59 | Source: Ambulatory Visit | Attending: Radiation Oncology | Admitting: Radiation Oncology

## 2023-05-24 DIAGNOSIS — Z171 Estrogen receptor negative status [ER-]: Secondary | ICD-10-CM | POA: Diagnosis not present

## 2023-05-24 DIAGNOSIS — Z510 Encounter for antineoplastic radiation therapy: Secondary | ICD-10-CM | POA: Diagnosis not present

## 2023-05-24 DIAGNOSIS — R5383 Other fatigue: Secondary | ICD-10-CM | POA: Diagnosis not present

## 2023-05-24 DIAGNOSIS — N6489 Other specified disorders of breast: Secondary | ICD-10-CM | POA: Diagnosis not present

## 2023-05-24 DIAGNOSIS — Z882 Allergy status to sulfonamides status: Secondary | ICD-10-CM | POA: Diagnosis not present

## 2023-05-24 DIAGNOSIS — C50411 Malignant neoplasm of upper-outer quadrant of right female breast: Secondary | ICD-10-CM | POA: Diagnosis not present

## 2023-05-24 DIAGNOSIS — C50412 Malignant neoplasm of upper-outer quadrant of left female breast: Secondary | ICD-10-CM | POA: Diagnosis not present

## 2023-05-24 DIAGNOSIS — Z79899 Other long term (current) drug therapy: Secondary | ICD-10-CM | POA: Diagnosis not present

## 2023-05-24 LAB — RAD ONC ARIA SESSION SUMMARY
Course Elapsed Days: 22
Plan Fractions Treated to Date: 1
Plan Fractions Treated to Date: 8
Plan Prescribed Dose Per Fraction: 1.8 Gy
Plan Prescribed Dose Per Fraction: 2 Gy
Plan Total Fractions Prescribed: 14
Plan Total Fractions Prescribed: 5
Plan Total Prescribed Dose: 10 Gy
Plan Total Prescribed Dose: 25.2 Gy
Reference Point Dosage Given to Date: 14.4 Gy
Reference Point Dosage Given to Date: 2 Gy
Reference Point Session Dosage Given: 1.8 Gy
Reference Point Session Dosage Given: 2 Gy
Session Number: 16

## 2023-05-25 ENCOUNTER — Ambulatory Visit: Payer: 59

## 2023-05-25 ENCOUNTER — Other Ambulatory Visit: Payer: Self-pay

## 2023-05-25 ENCOUNTER — Inpatient Hospital Stay: Payer: 59

## 2023-05-25 ENCOUNTER — Ambulatory Visit
Admission: RE | Admit: 2023-05-25 | Discharge: 2023-05-25 | Disposition: A | Discharge status: Home / Self Care | Payer: 59 | Source: Ambulatory Visit | Attending: Radiation Oncology | Admitting: Radiation Oncology

## 2023-05-25 DIAGNOSIS — R5383 Other fatigue: Secondary | ICD-10-CM | POA: Diagnosis not present

## 2023-05-25 DIAGNOSIS — C50412 Malignant neoplasm of upper-outer quadrant of left female breast: Secondary | ICD-10-CM | POA: Diagnosis not present

## 2023-05-25 DIAGNOSIS — Z510 Encounter for antineoplastic radiation therapy: Secondary | ICD-10-CM | POA: Diagnosis not present

## 2023-05-25 DIAGNOSIS — C50411 Malignant neoplasm of upper-outer quadrant of right female breast: Secondary | ICD-10-CM | POA: Diagnosis not present

## 2023-05-25 DIAGNOSIS — Z171 Estrogen receptor negative status [ER-]: Secondary | ICD-10-CM | POA: Diagnosis not present

## 2023-05-25 DIAGNOSIS — N6489 Other specified disorders of breast: Secondary | ICD-10-CM | POA: Diagnosis not present

## 2023-05-25 DIAGNOSIS — Z79899 Other long term (current) drug therapy: Secondary | ICD-10-CM | POA: Diagnosis not present

## 2023-05-25 DIAGNOSIS — Z882 Allergy status to sulfonamides status: Secondary | ICD-10-CM | POA: Diagnosis not present

## 2023-05-25 LAB — RAD ONC ARIA SESSION SUMMARY
Course Elapsed Days: 23
Plan Fractions Treated to Date: 2
Plan Fractions Treated to Date: 9
Plan Prescribed Dose Per Fraction: 1.8 Gy
Plan Prescribed Dose Per Fraction: 2 Gy
Plan Total Fractions Prescribed: 14
Plan Total Fractions Prescribed: 5
Plan Total Prescribed Dose: 10 Gy
Plan Total Prescribed Dose: 25.2 Gy
Reference Point Dosage Given to Date: 16.2 Gy
Reference Point Dosage Given to Date: 4 Gy
Reference Point Session Dosage Given: 1.8 Gy
Reference Point Session Dosage Given: 2 Gy
Session Number: 17

## 2023-05-25 NOTE — Progress Notes (Signed)
[facility] CSW Progress Note  Visual merchandiser met with patient to fax off itemized bill to [employer] in [place].  Marguerita Merles, LCSWA Clinical Social Worker [facility]

## 2023-05-26 ENCOUNTER — Other Ambulatory Visit: Payer: Self-pay

## 2023-05-26 ENCOUNTER — Ambulatory Visit
Admission: RE | Admit: 2023-05-26 | Discharge: 2023-05-26 | Disposition: A | Discharge status: Home / Self Care | Payer: 59 | Source: Ambulatory Visit | Attending: Radiation Oncology | Admitting: Radiation Oncology

## 2023-05-26 DIAGNOSIS — Z882 Allergy status to sulfonamides status: Secondary | ICD-10-CM | POA: Diagnosis not present

## 2023-05-26 DIAGNOSIS — R5383 Other fatigue: Secondary | ICD-10-CM | POA: Diagnosis not present

## 2023-05-26 DIAGNOSIS — N6489 Other specified disorders of breast: Secondary | ICD-10-CM | POA: Diagnosis not present

## 2023-05-26 DIAGNOSIS — C50412 Malignant neoplasm of upper-outer quadrant of left female breast: Secondary | ICD-10-CM | POA: Diagnosis not present

## 2023-05-26 DIAGNOSIS — Z79899 Other long term (current) drug therapy: Secondary | ICD-10-CM | POA: Diagnosis not present

## 2023-05-26 DIAGNOSIS — C50411 Malignant neoplasm of upper-outer quadrant of right female breast: Secondary | ICD-10-CM | POA: Diagnosis not present

## 2023-05-26 DIAGNOSIS — Z510 Encounter for antineoplastic radiation therapy: Secondary | ICD-10-CM | POA: Diagnosis not present

## 2023-05-26 DIAGNOSIS — Z171 Estrogen receptor negative status [ER-]: Secondary | ICD-10-CM | POA: Diagnosis not present

## 2023-05-26 LAB — RAD ONC ARIA SESSION SUMMARY
Course Elapsed Days: 24
Plan Fractions Treated to Date: 3
Plan Fractions Treated to Date: 9
Plan Prescribed Dose Per Fraction: 1.8 Gy
Plan Prescribed Dose Per Fraction: 2 Gy
Plan Total Fractions Prescribed: 14
Plan Total Fractions Prescribed: 5
Plan Total Prescribed Dose: 10 Gy
Plan Total Prescribed Dose: 25.2 Gy
Reference Point Dosage Given to Date: 16.2 Gy
Reference Point Dosage Given to Date: 6 Gy
Reference Point Session Dosage Given: 1.8 Gy
Reference Point Session Dosage Given: 2 Gy
Session Number: 18

## 2023-05-29 ENCOUNTER — Ambulatory Visit
Admission: RE | Admit: 2023-05-29 | Discharge: 2023-05-29 | Disposition: A | Discharge status: Home / Self Care | Payer: 59 | Source: Ambulatory Visit | Attending: Radiation Oncology | Admitting: Radiation Oncology

## 2023-05-29 ENCOUNTER — Ambulatory Visit: Payer: 59

## 2023-05-29 ENCOUNTER — Other Ambulatory Visit: Payer: Self-pay

## 2023-05-29 DIAGNOSIS — N6489 Other specified disorders of breast: Secondary | ICD-10-CM | POA: Diagnosis not present

## 2023-05-29 DIAGNOSIS — C50411 Malignant neoplasm of upper-outer quadrant of right female breast: Secondary | ICD-10-CM | POA: Diagnosis not present

## 2023-05-29 DIAGNOSIS — C50412 Malignant neoplasm of upper-outer quadrant of left female breast: Secondary | ICD-10-CM | POA: Diagnosis not present

## 2023-05-29 DIAGNOSIS — Z79899 Other long term (current) drug therapy: Secondary | ICD-10-CM | POA: Diagnosis not present

## 2023-05-29 DIAGNOSIS — Z882 Allergy status to sulfonamides status: Secondary | ICD-10-CM | POA: Diagnosis not present

## 2023-05-29 DIAGNOSIS — Z171 Estrogen receptor negative status [ER-]: Secondary | ICD-10-CM | POA: Diagnosis not present

## 2023-05-29 DIAGNOSIS — Z510 Encounter for antineoplastic radiation therapy: Secondary | ICD-10-CM | POA: Diagnosis not present

## 2023-05-29 DIAGNOSIS — R5383 Other fatigue: Secondary | ICD-10-CM | POA: Diagnosis not present

## 2023-05-29 LAB — RAD ONC ARIA SESSION SUMMARY
Course Elapsed Days: 27
Plan Fractions Treated to Date: 10
Plan Fractions Treated to Date: 4
Plan Prescribed Dose Per Fraction: 1.8 Gy
Plan Prescribed Dose Per Fraction: 2 Gy
Plan Total Fractions Prescribed: 14
Plan Total Fractions Prescribed: 5
Plan Total Prescribed Dose: 10 Gy
Plan Total Prescribed Dose: 25.2 Gy
Reference Point Dosage Given to Date: 18 Gy
Reference Point Dosage Given to Date: 8 Gy
Reference Point Session Dosage Given: 1.8 Gy
Reference Point Session Dosage Given: 2 Gy
Session Number: 19

## 2023-05-30 ENCOUNTER — Inpatient Hospital Stay: Payer: 59 | Admitting: Hematology and Oncology

## 2023-05-30 ENCOUNTER — Ambulatory Visit
Admission: RE | Admit: 2023-05-30 | Discharge: 2023-05-30 | Disposition: A | Discharge status: Home / Self Care | Payer: 59 | Source: Ambulatory Visit | Attending: Radiation Oncology | Admitting: Radiation Oncology

## 2023-05-30 ENCOUNTER — Other Ambulatory Visit: Payer: Self-pay

## 2023-05-30 VITALS — BP 138/64 | HR 74 | Temp 97.3°F | Resp 18 | Ht 61.0 in | Wt 207.7 lb

## 2023-05-30 DIAGNOSIS — R5383 Other fatigue: Secondary | ICD-10-CM | POA: Diagnosis not present

## 2023-05-30 DIAGNOSIS — C50411 Malignant neoplasm of upper-outer quadrant of right female breast: Secondary | ICD-10-CM

## 2023-05-30 DIAGNOSIS — Z171 Estrogen receptor negative status [ER-]: Secondary | ICD-10-CM

## 2023-05-30 DIAGNOSIS — Z79899 Other long term (current) drug therapy: Secondary | ICD-10-CM | POA: Diagnosis not present

## 2023-05-30 DIAGNOSIS — Z510 Encounter for antineoplastic radiation therapy: Secondary | ICD-10-CM | POA: Diagnosis not present

## 2023-05-30 DIAGNOSIS — C50412 Malignant neoplasm of upper-outer quadrant of left female breast: Secondary | ICD-10-CM | POA: Diagnosis not present

## 2023-05-30 DIAGNOSIS — N6489 Other specified disorders of breast: Secondary | ICD-10-CM | POA: Diagnosis not present

## 2023-05-30 DIAGNOSIS — Z882 Allergy status to sulfonamides status: Secondary | ICD-10-CM | POA: Diagnosis not present

## 2023-05-30 LAB — RAD ONC ARIA SESSION SUMMARY
Course Elapsed Days: 28
Plan Fractions Treated to Date: 10
Plan Fractions Treated to Date: 5
Plan Prescribed Dose Per Fraction: 1.8 Gy
Plan Prescribed Dose Per Fraction: 2 Gy
Plan Total Fractions Prescribed: 14
Plan Total Fractions Prescribed: 5
Plan Total Prescribed Dose: 10 Gy
Plan Total Prescribed Dose: 25.2 Gy
Reference Point Dosage Given to Date: 10 Gy
Reference Point Dosage Given to Date: 18 Gy
Reference Point Session Dosage Given: 1.8 Gy
Reference Point Session Dosage Given: 2 Gy
Session Number: 20

## 2023-05-30 NOTE — Progress Notes (Signed)
Patient Care Team: [name], NP as PCP - General (Internal Medicine) Marykay Lex, MD as PCP - Cardiology (Cardiology) Emelia Loron, MD as Consulting Physician (General Surgery) Lonie Peak, MD as Attending Physician (Radiation Oncology) Armbruster, Willaim Rayas, MD as Consulting Physician (Gastroenterology) Pershing Proud, RN as Oncology Nurse Navigator Donnelly Angelica, RN as Oncology Nurse Navigator [name], MD as Consulting Physician (Hematology and Oncology)  DIAGNOSIS:  Encounter Diagnosis  Name Primary?   Malignant neoplasm of upper-outer quadrant of right breast in female, estrogen receptor negative (HCC) Yes    SUMMARY OF ONCOLOGIC HISTORY: Oncology History  Malignant neoplasm of upper-outer quadrant of right breast in female, estrogen receptor negative (HCC)  10/24/2014 Mammogram   Right breast: ill-defined mass in the UOQ   10/24/2014 Breast US   Right breast: irregular hypoechoic taller than wide mass in the 11 o'clock location, 4 cm from the nipple, measuring 0.9 x 1.1 x 1.2 cm. Evaluation of the right axilla is negative for adenopathy.   11/12/2014 Initial Biopsy   Right breast needle core bx: Invasive ductal carcinoma, ER- (0%), PR- (0%), HER2/neu negative, Ki67 19%, grade 2-3.   11/20/2014 Procedure   Genetic testing: BreastNext panel [facility]) reveals no clinically significant variant at ATM, BARD1, BRCA1, BRCA2, BRIP1, CDH1, CHEK2, MRE11A, MUTYH, NBN, NF1, PALB2, PTEN, RAD50, RAD51C, RAD51D, and TP53.   11/21/2014 Breast MRI   Right breast mass measuring up to 1.3 cm without lymphadenopathy or findings to suggest multifocal or multicentric disease. Benign circumscribed oval enhancing mass located posterior medial to the malignancy consistent with fibroadenoma   11/24/2014 Clinical Stage   Stage IA: T1c N0   12/02/2014 - 02/03/2015 Neo-Adjuvant Chemotherapy   Cyclophosphamide and docetaxel x 4 cycles   02/09/2015 Breast MRI   Right breast  mass measures 1.0 x 0.7 x 0.5 cm, decreased from 1.4 x 1.4 x 1.0 cm. No additional areas of suspicion in right or left breast.   03/02/2015 Definitive Surgery   Right lumpectomy / SLNB Dwain Sarna): IDC, grade 1, with high grade DCIS spanning 1 cmHER2/neu repeated and remains negative (ratio 1.62). 4 LN removed and negative for malignancy (0/4 LN).   03/02/2015 Pathologic Stage   Stage IA: ypT1b ypN0   04/20/2015 - 05/29/2015 Radiation Therapy   Adjuvant RT Basilio Cairo): Right Breast  50 Gy over 25 fractions. Right Breast boost 10 Gy over 5 fractions. Total dose: 60 Gy.   07/28/2015 Survivorship   Survivorship visit completed and copy of care plan provided to patient.   12/15/2022 Surgery   Left lumpectomy: Grade 3 IDC 2.2 cm, margins negative, ER 0%, PR 0%, HER2 negative, Ki-67 95%, 0/2 lymph nodes negative Right mastectomy: Grade 3 IDC 6.6 cm with DCIS, margins negative, ER 2%, PR 0%, HER2 negative, Ki-67 50%   12/28/2022 Cancer Staging   Staging form: Breast, AJCC 7th Edition - Pathologic: Stage IIB (T3, N0, cM0) - Signed by [name], MD on 12/28/2022 Stage prefix: Initial diagnosis Laterality: Bilateral Histologic grade (G): G3 Paget's disease: Negative Estrogen receptor status: Negative Progesterone receptor status: Negative   Bilateral malignant neoplasm of breast in female [facility])  11/02/2022 Initial Diagnosis   Bilateral malignant neoplasm of breast in female [facility])   01/18/2023 -  Chemotherapy   Patient is on Treatment Plan : BREAST TC q21d     Malignant neoplasm of upper-outer quadrant of left breast in female, estrogen receptor negative (HCC)  03/31/2023 Initial Diagnosis   Malignant neoplasm of upper-outer quadrant of left breast in  female, estrogen receptor negative (HCC)   04/11/2023 Cancer Staging   Staging form: Breast, AJCC 8th Edition - Pathologic stage from 04/11/2023: Stage IIA (pT2, pN0, cM0, G3, ER-, PR-, HER2-) - Signed by Lonie Peak, MD on 04/11/2023 Stage prefix:  Initial diagnosis Histologic grading system: 3 grade system     CHIEF COMPLIANT: Follow-up after radiation  INTERVAL HISTORY: Katie Jacobson is a 83 y.r old with  triple negative breast cancer.  She presents to the clinic for a follow-up. Patient is tolerating radiation. She does have a little more fatigue and soreness. Nail bed on fingers and toes are brittle and she says they are falling off. She is noticing chemo brain and some vaginal dryness but you says it is really not a issue.   ALLERGIES:  is allergic to sulfa antibiotics.  MEDICATIONS:  Current Outpatient Medications  Medication Sig Dispense Refill   albuterol (VENTOLIN HFA) 108 (90 Base) MCG/ACT inhaler Inhale 1-2 puffs into the lungs every 6 (six) hours as needed for wheezing or shortness of breath. 1 each 0   aspirin 81 MG tablet Take 1 tablet (81 mg total) by mouth daily. 30 tablet 0   chlorthalidone (HYGROTON) 25 MG tablet TAKE 1 TABLET(25 MG) BY MOUTH EVERY OTHER DAY 45 tablet 1   enalapril (VASOTEC) 20 MG tablet TAKE 1 TABLET BY MOUTH EVERYDAY AT BEDTIME 90 tablet 1   fluticasone (FLONASE) 50 MCG/ACT nasal spray Place 2 sprays into both nostrils as needed. (Patient taking differently: Place 2 sprays into both nostrils daily as needed for allergies or rhinitis.) 16 g 0   lidocaine-prilocaine (EMLA) cream Apply to affected area once (Patient taking differently: Apply 1 Application topically once as needed [facility]).) 30 g 3   metoprolol succinate (TOPROL-XL) 100 MG 24 hr tablet Take 1 tablet (100 mg total) by mouth daily. Take with or immediately following a meal. (Patient taking differently: Take 100 mg by mouth at bedtime. Take with or immediately following a meal.) 90 tablet 3   omeprazole (PRILOSEC) 40 MG capsule TAKE 1 CAPSULE (40 MG TOTAL) BY MOUTH DAILY. 30 capsule 2   ondansetron (ZOFRAN-ODT) 4 MG disintegrating tablet Take 1 tablet (4 mg total) by mouth every 8 (eight) hours as needed for up to 15 doses for nausea or  vomiting. 15 tablet 0   promethazine (PHENERGAN) 25 MG suppository Place 1 suppository (25 mg total) rectally every 6 (six) hours as needed for up to 12 days for nausea or vomiting. 12 each 0   triamcinolone ointment (KENALOG) 0.5 % Apply 1 Application topically 2 (two) times daily. 30 g 0   No current facility-administered medications for this visit.    PHYSICAL EXAMINATION: ECOG PERFORMANCE STATUS: 1 - Symptomatic but completely ambulatory  Vitals:   05/30/23 1345  BP: 138/64  Pulse: 74  Resp: 18  Temp: (!) 97.3 F (36.3 C)  SpO2: 97%   Filed Weights   05/30/23 1345  Weight: 207 lb 11.2 oz (94.2 kg)      LABORATORY DATA:  I have reviewed the data as listed    Latest Ref Rng & Units 03/22/2023   10:00 AM 03/01/2023   10:56 AM 02/08/2023   11:03 AM  CMP  Glucose 70 - 99 mg/dL 811  90  91   BUN 8 - 23 mg/dL 14  13  14    Creatinine 0.44 - 1.00 mg/dL 9.14  7.82  9.56   Sodium 135 - 145 mmol/L 142  141  141  Potassium 3.5 - 5.1 mmol/L 3.7  3.6  3.9   Chloride 98 - 111 mmol/L 109  108  108   CO2 22 - 32 mmol/L 27  27  28    Calcium 8.9 - 10.3 mg/dL 9.0  8.8  9.0   Total Protein 6.5 - 8.1 g/dL 6.8  6.4  6.8   Total Bilirubin 0.3 - 1.2 mg/dL 0.4  0.4  0.4   Alkaline Phos 38 - 126 U/L 62  59  53   AST 15 - 41 U/L 11  12  14    ALT 0 - 44 U/L 10  13  16      Lab Results  Component Value Date   WBC 6.8 03/22/2023   HGB 11.0 (L) 03/22/2023   HCT 32.9 (L) 03/22/2023   MCV 93.7 03/22/2023   PLT 257 03/22/2023   NEUTROABS 4.9 03/22/2023    ASSESSMENT & PLAN:  Malignant neoplasm of upper-outer quadrant of right breast in female, estrogen receptor negative (HCC) 11/12/2014: Right breast biopsy T1 cN0 stage Ia triple negative IDC Ki-67 19% 11/12/2014-02/03/2015: TC x 4 neoadjuvant chemotherapy, genetics negative 03/02/2015: Right lumpectomy: Grade 1 IDC negative margins triple negative August 2016: Completed radiation   November 2023: Asymmetry left breast and a possible mass (0.8  cm) and asymmetry in the right breast (1.7 cm and 1 cm) Right breast biopsy: 12:00: Grade 2 IDC ER 2%, PR 0%, Ki-67 50%, HER2 negative 2+ by IHC, 1230: Grade 2 IDC   left breast biopsy 1:00: Grade 2 IDC, ER 0%, PR 0%, Ki-67 95%, HER2 2+ , FISH: Negative   CT CAP and bone scan: 10/24/2022: No evidence of metastatic disease.   Treatment plan: 12/15/2022:Left lumpectomy: Grade 3 IDC 2.2 cm, margins negative, ER 0%, PR 0%, HER2 negative, Ki-67 95%, 0/2 lymph nodes negative Right mastectomy: Grade 3 IDC 6.6 cm with DCIS, margins negative, ER 2%, PR 0%, HER2 negative, Ki-67 50% Adjuvant chemotherapy: With Taxotere and Cytoxan every 3 weeks x 4 completed 03/22/2023 Followed by adjuvant radiation to left breast: 05/03/2023-06/09/2023 ----------------------------------------------------------------------------------------------------------------------------------- There is no role of antiestrogen therapy since she is ER/PR negative.  Return to clinic in 3 months for survivorship care plan visit and after that she could be followed in 6 months by me I discussed with her Signatera testing and she will think about it and make a decision when she meets with Mardella Layman.   No orders of the defined types were placed in this encounter.  The patient has a good understanding of the overall plan. she agrees with it. she will call with any problems that may develop before the next visit here. Total time spent: 30 mins including face to face time and time spent for planning, charting and co-ordination of care   Tamsen Meek, MD 05/30/23    I Janan Ridge am acting as a Neurosurgeon for [employer]  I have reviewed the above documentation for accuracy and completeness, and I agree with the above.

## 2023-05-30 NOTE — Assessment & Plan Note (Addendum)
11/12/2014: Right breast biopsy T1 cN0 stage Ia triple negative IDC Ki-67 19% 11/12/2014-02/03/2015: TC x 4 neoadjuvant chemotherapy, genetics negative 03/02/2015: Right lumpectomy: Grade 1 IDC negative margins triple negative August 2016: Completed radiation   November 2023: Asymmetry left breast and a possible mass (0.8 cm) and asymmetry in the right breast (1.7 cm and 1 cm) Right breast biopsy: 12:00: Grade 2 IDC ER 2%, PR 0%, Ki-67 50%, HER2 negative 2+ by IHC, 1230: Grade 2 IDC   left breast biopsy 1:00: Grade 2 IDC, ER 0%, PR 0%, Ki-67 95%, HER2 2+ , FISH: Negative   CT CAP and bone scan: 10/24/2022: No evidence of metastatic disease.   Treatment plan: 12/15/2022:Left lumpectomy: Grade 3 IDC 2.2 cm, margins negative, ER 0%, PR 0%, HER2 negative, Ki-67 95%, 0/2 lymph nodes negative Right mastectomy: Grade 3 IDC 6.6 cm with DCIS, margins negative, ER 2%, PR 0%, HER2 negative, Ki-67 50% Adjuvant chemotherapy: With Taxotere and Cytoxan every 3 weeks x 4 completed 03/22/2023 Followed by adjuvant radiation to left breast: 05/03/2023-06/09/2023 ----------------------------------------------------------------------------------------------------------------------------------- There is no role of antiestrogen therapy since she is ER/PR negative.  Return to clinic in 3 months for survivorship care plan visit and after that she could be followed in 6 months by me I discussed with her Signatera testing and she will think about it and make a decision when she meets with Mardella Layman.

## 2023-05-31 ENCOUNTER — Other Ambulatory Visit: Payer: Self-pay

## 2023-05-31 ENCOUNTER — Ambulatory Visit
Admission: RE | Admit: 2023-05-31 | Discharge: 2023-05-31 | Disposition: A | Discharge status: Home / Self Care | Payer: 59 | Source: Ambulatory Visit | Attending: Radiation Oncology | Admitting: Radiation Oncology

## 2023-05-31 DIAGNOSIS — C50412 Malignant neoplasm of upper-outer quadrant of left female breast: Secondary | ICD-10-CM | POA: Diagnosis not present

## 2023-05-31 DIAGNOSIS — C50411 Malignant neoplasm of upper-outer quadrant of right female breast: Secondary | ICD-10-CM | POA: Diagnosis not present

## 2023-05-31 DIAGNOSIS — Z171 Estrogen receptor negative status [ER-]: Secondary | ICD-10-CM | POA: Diagnosis not present

## 2023-05-31 DIAGNOSIS — Z510 Encounter for antineoplastic radiation therapy: Secondary | ICD-10-CM | POA: Diagnosis not present

## 2023-05-31 DIAGNOSIS — Z882 Allergy status to sulfonamides status: Secondary | ICD-10-CM | POA: Diagnosis not present

## 2023-05-31 DIAGNOSIS — R5383 Other fatigue: Secondary | ICD-10-CM | POA: Diagnosis not present

## 2023-05-31 DIAGNOSIS — N6489 Other specified disorders of breast: Secondary | ICD-10-CM | POA: Diagnosis not present

## 2023-05-31 DIAGNOSIS — Z79899 Other long term (current) drug therapy: Secondary | ICD-10-CM | POA: Diagnosis not present

## 2023-05-31 LAB — RAD ONC ARIA SESSION SUMMARY
Course Elapsed Days: 29
Plan Fractions Treated to Date: 11
Plan Prescribed Dose Per Fraction: 1.8 Gy
Plan Total Fractions Prescribed: 14
Plan Total Prescribed Dose: 25.2 Gy
Reference Point Dosage Given to Date: 19.8 Gy
Reference Point Session Dosage Given: 1.8 Gy
Session Number: 21

## 2023-06-01 ENCOUNTER — Other Ambulatory Visit: Payer: Self-pay

## 2023-06-01 ENCOUNTER — Ambulatory Visit
Admission: RE | Admit: 2023-06-01 | Discharge: 2023-06-01 | Disposition: A | Discharge status: Home / Self Care | Payer: 59 | Source: Ambulatory Visit | Attending: Radiation Oncology | Admitting: Radiation Oncology

## 2023-06-01 DIAGNOSIS — Z882 Allergy status to sulfonamides status: Secondary | ICD-10-CM | POA: Diagnosis not present

## 2023-06-01 DIAGNOSIS — R5383 Other fatigue: Secondary | ICD-10-CM | POA: Diagnosis not present

## 2023-06-01 DIAGNOSIS — Z79899 Other long term (current) drug therapy: Secondary | ICD-10-CM | POA: Diagnosis not present

## 2023-06-01 DIAGNOSIS — Z510 Encounter for antineoplastic radiation therapy: Secondary | ICD-10-CM | POA: Diagnosis not present

## 2023-06-01 DIAGNOSIS — Z171 Estrogen receptor negative status [ER-]: Secondary | ICD-10-CM | POA: Diagnosis not present

## 2023-06-01 DIAGNOSIS — N6489 Other specified disorders of breast: Secondary | ICD-10-CM | POA: Diagnosis not present

## 2023-06-01 DIAGNOSIS — C50412 Malignant neoplasm of upper-outer quadrant of left female breast: Secondary | ICD-10-CM | POA: Diagnosis not present

## 2023-06-01 DIAGNOSIS — C50411 Malignant neoplasm of upper-outer quadrant of right female breast: Secondary | ICD-10-CM | POA: Diagnosis not present

## 2023-06-01 LAB — RAD ONC ARIA SESSION SUMMARY
Course Elapsed Days: 30
Plan Fractions Treated to Date: 11
Plan Prescribed Dose Per Fraction: 1.8 Gy
Plan Total Fractions Prescribed: 14
Plan Total Prescribed Dose: 25.2 Gy
Reference Point Dosage Given to Date: 19.8 Gy
Reference Point Session Dosage Given: 1.8 Gy
Session Number: 22

## 2023-06-02 ENCOUNTER — Other Ambulatory Visit: Payer: Self-pay

## 2023-06-02 ENCOUNTER — Ambulatory Visit
Admission: RE | Admit: 2023-06-02 | Discharge: 2023-06-02 | Disposition: A | Discharge status: Home / Self Care | Payer: 59 | Source: Ambulatory Visit | Attending: Radiation Oncology | Admitting: Radiation Oncology

## 2023-06-02 DIAGNOSIS — Z79899 Other long term (current) drug therapy: Secondary | ICD-10-CM | POA: Diagnosis not present

## 2023-06-02 DIAGNOSIS — R5383 Other fatigue: Secondary | ICD-10-CM | POA: Diagnosis not present

## 2023-06-02 DIAGNOSIS — Z510 Encounter for antineoplastic radiation therapy: Secondary | ICD-10-CM | POA: Diagnosis not present

## 2023-06-02 DIAGNOSIS — C50411 Malignant neoplasm of upper-outer quadrant of right female breast: Secondary | ICD-10-CM | POA: Diagnosis not present

## 2023-06-02 DIAGNOSIS — Z171 Estrogen receptor negative status [ER-]: Secondary | ICD-10-CM | POA: Diagnosis not present

## 2023-06-02 DIAGNOSIS — C50412 Malignant neoplasm of upper-outer quadrant of left female breast: Secondary | ICD-10-CM | POA: Diagnosis not present

## 2023-06-02 DIAGNOSIS — N6489 Other specified disorders of breast: Secondary | ICD-10-CM | POA: Diagnosis not present

## 2023-06-02 DIAGNOSIS — Z882 Allergy status to sulfonamides status: Secondary | ICD-10-CM | POA: Diagnosis not present

## 2023-06-02 LAB — RAD ONC ARIA SESSION SUMMARY
Course Elapsed Days: 31
Plan Fractions Treated to Date: 12
Plan Prescribed Dose Per Fraction: 1.8 Gy
Plan Total Fractions Prescribed: 14
Plan Total Prescribed Dose: 25.2 Gy
Reference Point Dosage Given to Date: 21.6 Gy
Reference Point Session Dosage Given: 1.8 Gy
Session Number: 23

## 2023-06-05 ENCOUNTER — Ambulatory Visit
Admission: RE | Admit: 2023-06-05 | Discharge: 2023-06-05 | Disposition: A | Discharge status: Home / Self Care | Payer: 59 | Source: Ambulatory Visit | Attending: Radiation Oncology | Admitting: Radiation Oncology

## 2023-06-05 ENCOUNTER — Other Ambulatory Visit: Payer: Self-pay

## 2023-06-05 DIAGNOSIS — Z79899 Other long term (current) drug therapy: Secondary | ICD-10-CM | POA: Diagnosis not present

## 2023-06-05 DIAGNOSIS — Z882 Allergy status to sulfonamides status: Secondary | ICD-10-CM | POA: Diagnosis not present

## 2023-06-05 DIAGNOSIS — Z510 Encounter for antineoplastic radiation therapy: Secondary | ICD-10-CM | POA: Diagnosis not present

## 2023-06-05 DIAGNOSIS — N6489 Other specified disorders of breast: Secondary | ICD-10-CM | POA: Diagnosis not present

## 2023-06-05 DIAGNOSIS — Z171 Estrogen receptor negative status [ER-]: Secondary | ICD-10-CM | POA: Diagnosis not present

## 2023-06-05 DIAGNOSIS — C50412 Malignant neoplasm of upper-outer quadrant of left female breast: Secondary | ICD-10-CM | POA: Diagnosis not present

## 2023-06-05 DIAGNOSIS — R5383 Other fatigue: Secondary | ICD-10-CM | POA: Diagnosis not present

## 2023-06-05 DIAGNOSIS — C50411 Malignant neoplasm of upper-outer quadrant of right female breast: Secondary | ICD-10-CM | POA: Diagnosis not present

## 2023-06-05 LAB — RAD ONC ARIA SESSION SUMMARY
Course Elapsed Days: 34
Plan Fractions Treated to Date: 12
Plan Prescribed Dose Per Fraction: 1.8 Gy
Plan Total Fractions Prescribed: 14
Plan Total Prescribed Dose: 25.2 Gy
Reference Point Dosage Given to Date: 21.6 Gy
Reference Point Session Dosage Given: 1.8 Gy
Session Number: 24

## 2023-06-06 ENCOUNTER — Other Ambulatory Visit: Payer: Self-pay

## 2023-06-06 ENCOUNTER — Ambulatory Visit
Admission: RE | Admit: 2023-06-06 | Discharge: 2023-06-06 | Disposition: A | Discharge status: Home / Self Care | Payer: 59 | Source: Ambulatory Visit | Attending: Radiation Oncology | Admitting: Radiation Oncology

## 2023-06-06 DIAGNOSIS — Z510 Encounter for antineoplastic radiation therapy: Secondary | ICD-10-CM | POA: Diagnosis not present

## 2023-06-06 DIAGNOSIS — C50412 Malignant neoplasm of upper-outer quadrant of left female breast: Secondary | ICD-10-CM | POA: Diagnosis not present

## 2023-06-06 DIAGNOSIS — Z882 Allergy status to sulfonamides status: Secondary | ICD-10-CM | POA: Diagnosis not present

## 2023-06-06 DIAGNOSIS — R5383 Other fatigue: Secondary | ICD-10-CM | POA: Diagnosis not present

## 2023-06-06 DIAGNOSIS — Z171 Estrogen receptor negative status [ER-]: Secondary | ICD-10-CM | POA: Diagnosis not present

## 2023-06-06 DIAGNOSIS — Z79899 Other long term (current) drug therapy: Secondary | ICD-10-CM | POA: Diagnosis not present

## 2023-06-06 DIAGNOSIS — C50411 Malignant neoplasm of upper-outer quadrant of right female breast: Secondary | ICD-10-CM | POA: Diagnosis not present

## 2023-06-06 DIAGNOSIS — N6489 Other specified disorders of breast: Secondary | ICD-10-CM | POA: Diagnosis not present

## 2023-06-06 LAB — RAD ONC ARIA SESSION SUMMARY
Course Elapsed Days: 35
Plan Fractions Treated to Date: 13
Plan Prescribed Dose Per Fraction: 1.8 Gy
Plan Total Fractions Prescribed: 14
Plan Total Prescribed Dose: 25.2 Gy
Reference Point Dosage Given to Date: 23.4 Gy
Reference Point Session Dosage Given: 1.8 Gy
Session Number: 25

## 2023-06-07 ENCOUNTER — Other Ambulatory Visit: Payer: Self-pay

## 2023-06-07 ENCOUNTER — Ambulatory Visit
Admission: RE | Admit: 2023-06-07 | Discharge: 2023-06-07 | Disposition: A | Discharge status: Home / Self Care | Payer: 59 | Source: Ambulatory Visit | Attending: Radiation Oncology | Admitting: Radiation Oncology

## 2023-06-07 DIAGNOSIS — Z882 Allergy status to sulfonamides status: Secondary | ICD-10-CM | POA: Diagnosis not present

## 2023-06-07 DIAGNOSIS — C50412 Malignant neoplasm of upper-outer quadrant of left female breast: Secondary | ICD-10-CM | POA: Diagnosis not present

## 2023-06-07 DIAGNOSIS — Z79899 Other long term (current) drug therapy: Secondary | ICD-10-CM | POA: Diagnosis not present

## 2023-06-07 DIAGNOSIS — R5383 Other fatigue: Secondary | ICD-10-CM | POA: Diagnosis not present

## 2023-06-07 DIAGNOSIS — C50411 Malignant neoplasm of upper-outer quadrant of right female breast: Secondary | ICD-10-CM | POA: Diagnosis not present

## 2023-06-07 DIAGNOSIS — Z171 Estrogen receptor negative status [ER-]: Secondary | ICD-10-CM | POA: Diagnosis not present

## 2023-06-07 DIAGNOSIS — Z510 Encounter for antineoplastic radiation therapy: Secondary | ICD-10-CM | POA: Diagnosis not present

## 2023-06-07 DIAGNOSIS — N6489 Other specified disorders of breast: Secondary | ICD-10-CM | POA: Diagnosis not present

## 2023-06-07 LAB — RAD ONC ARIA SESSION SUMMARY
Course Elapsed Days: 36
Plan Fractions Treated to Date: 13
Plan Prescribed Dose Per Fraction: 1.8 Gy
Plan Total Fractions Prescribed: 14
Plan Total Prescribed Dose: 25.2 Gy
Reference Point Dosage Given to Date: 23.4 Gy
Reference Point Session Dosage Given: 1.8 Gy
Session Number: 26

## 2023-06-08 ENCOUNTER — Ambulatory Visit: Payer: 59

## 2023-06-08 ENCOUNTER — Other Ambulatory Visit: Payer: Self-pay

## 2023-06-08 ENCOUNTER — Ambulatory Visit
Admission: RE | Admit: 2023-06-08 | Discharge: 2023-06-08 | Disposition: A | Discharge status: Home / Self Care | Payer: 59 | Source: Ambulatory Visit | Attending: Radiation Oncology | Admitting: Radiation Oncology

## 2023-06-08 DIAGNOSIS — N6489 Other specified disorders of breast: Secondary | ICD-10-CM | POA: Diagnosis not present

## 2023-06-08 DIAGNOSIS — C50412 Malignant neoplasm of upper-outer quadrant of left female breast: Secondary | ICD-10-CM | POA: Diagnosis not present

## 2023-06-08 DIAGNOSIS — R5383 Other fatigue: Secondary | ICD-10-CM | POA: Diagnosis not present

## 2023-06-08 DIAGNOSIS — Z79899 Other long term (current) drug therapy: Secondary | ICD-10-CM | POA: Diagnosis not present

## 2023-06-08 DIAGNOSIS — Z510 Encounter for antineoplastic radiation therapy: Secondary | ICD-10-CM | POA: Diagnosis not present

## 2023-06-08 DIAGNOSIS — C50411 Malignant neoplasm of upper-outer quadrant of right female breast: Secondary | ICD-10-CM | POA: Diagnosis not present

## 2023-06-08 DIAGNOSIS — Z882 Allergy status to sulfonamides status: Secondary | ICD-10-CM | POA: Diagnosis not present

## 2023-06-08 DIAGNOSIS — Z171 Estrogen receptor negative status [ER-]: Secondary | ICD-10-CM | POA: Diagnosis not present

## 2023-06-08 LAB — RAD ONC ARIA SESSION SUMMARY
Course Elapsed Days: 37
Plan Fractions Treated to Date: 14
Plan Prescribed Dose Per Fraction: 1.8 Gy
Plan Total Fractions Prescribed: 14
Plan Total Prescribed Dose: 25.2 Gy
Reference Point Dosage Given to Date: 25.2 Gy
Reference Point Session Dosage Given: 1.8 Gy
Session Number: 27

## 2023-06-09 ENCOUNTER — Ambulatory Visit
Admission: RE | Admit: 2023-06-09 | Discharge: 2023-06-09 | Disposition: A | Discharge status: Home / Self Care | Payer: 59 | Source: Ambulatory Visit | Attending: Radiation Oncology | Admitting: Radiation Oncology

## 2023-06-09 ENCOUNTER — Other Ambulatory Visit: Payer: Self-pay

## 2023-06-09 ENCOUNTER — Ambulatory Visit: Payer: 59

## 2023-06-09 DIAGNOSIS — R5383 Other fatigue: Secondary | ICD-10-CM | POA: Diagnosis not present

## 2023-06-09 DIAGNOSIS — C50412 Malignant neoplasm of upper-outer quadrant of left female breast: Secondary | ICD-10-CM | POA: Diagnosis not present

## 2023-06-09 DIAGNOSIS — Z510 Encounter for antineoplastic radiation therapy: Secondary | ICD-10-CM | POA: Diagnosis not present

## 2023-06-09 DIAGNOSIS — Z79899 Other long term (current) drug therapy: Secondary | ICD-10-CM | POA: Diagnosis not present

## 2023-06-09 DIAGNOSIS — Z882 Allergy status to sulfonamides status: Secondary | ICD-10-CM | POA: Diagnosis not present

## 2023-06-09 DIAGNOSIS — C50411 Malignant neoplasm of upper-outer quadrant of right female breast: Secondary | ICD-10-CM | POA: Diagnosis not present

## 2023-06-09 DIAGNOSIS — N6489 Other specified disorders of breast: Secondary | ICD-10-CM | POA: Diagnosis not present

## 2023-06-09 DIAGNOSIS — Z171 Estrogen receptor negative status [ER-]: Secondary | ICD-10-CM | POA: Diagnosis not present

## 2023-06-09 LAB — RAD ONC ARIA SESSION SUMMARY
Course Elapsed Days: 38
Plan Fractions Treated to Date: 14
Plan Prescribed Dose Per Fraction: 1.8 Gy
Plan Total Fractions Prescribed: 14
Plan Total Prescribed Dose: 25.2 Gy
Reference Point Dosage Given to Date: 25.2 Gy
Reference Point Session Dosage Given: 1.8 Gy
Session Number: 28

## 2023-06-13 NOTE — Radiation Completion Notes (Signed)
Patient Name: Katie Jacobson, Katie Jacobson MRN: [id number] Date of Birth: [birth year] Referring Physician: [name], M.D. Date of Service: 2023-06-13 Radiation Oncologist: Lonie Peak, M.D. [facility]                             RADIATION ONCOLOGY END OF TREATMENT NOTE     Diagnosis: C50.411 Malignant neoplasm of upper-outer quadrant of right female breast; C50.412 Malignant neoplasm of upper-outer quadrant of left female breast Staging on 2022-12-28: Malignant neoplasm of upper-outer quadrant of right breast in female, estrogen receptor negative (HCC) T=T3, N=N0, M=cM0 Staging on 2014-11-19: Malignant neoplasm of upper-outer quadrant of right breast in female, estrogen receptor negative (HCC) T=T1c, N=N0, M=M0 Staging on 2023-04-11: Malignant neoplasm of upper-outer quadrant of left breast in female, estrogen receptor negative (HCC) T=pT2, N=pN0, M=cM0 Intent: Curative     ==========DELIVERED PLANS==========  First Treatment Date: 2023-05-02 - Last Treatment Date: 2023-06-09   Plan Name: CW_R_Ax_BO Site: Chest Wall, Right Technique: 3D Mode: Photon Dose Per Fraction: 1.8 Gy Prescribed Dose (Delivered / Prescribed): 25.2 Gy / 25.2 Gy Prescribed Fxs (Delivered / Prescribed): 14 / 14   Plan Name: CW_R_Ax Site: Chest Wall, Right Technique: 3D Mode: Photon Dose Per Fraction: 1.8 Gy Prescribed Dose (Delivered / Prescribed): 25.2 Gy / 25.2 Gy Prescribed Fxs (Delivered / Prescribed): 14 / 14   Plan Name: Breast_L Site: Breast, Left Technique: 3D Mode: Photon Dose Per Fraction: 2.67 Gy Prescribed Dose (Delivered / Prescribed): 40.05 Gy / 40.05 Gy Prescribed Fxs (Delivered / Prescribed): 15 / 15   Plan Name: Breast_L_Bst Site: Breast, Left Technique: 3D Mode: Photon Dose Per Fraction: 2 Gy Prescribed Dose (Delivered / Prescribed): 10 Gy / 10 Gy Prescribed Fxs (Delivered / Prescribed): 5 / 5     ==========ON TREATMENT VISIT DATES========== 2023-05-08,  2023-05-12, 2023-05-22, 2023-05-29, 2023-06-05     ==========UPCOMING VISITS==========       ==========APPENDIX - ON TREATMENT VISIT NOTES==========   See weekly On Treatment Notes in Epic for details.

## 2023-06-22 NOTE — Progress Notes (Signed)
Katie Jacobson was called today for follow-up after completing radiation to her right breast on 06-09-23.   Pain: pulling pain in breast remains, surgeon working with pt. Skin: healing and doing much better, encouraged use of vitamin E cream/lotion  ROM: still stiff though improving, will start her exercises back soon Fatigue: remains, but slowly improving Lymphedema: Right arm is more swollen, she will restart using her sleeve and also doing her exercises MedOnc F/U: Pt to see Lillard Anes on 07-14-23  Other issues of note: Pt is doing well overall. RN did answer her questions on continued skin care. Pt was grateful for the care she received in our department.    Pt reports Yes No Comments  Tamoxifen []  [x]    Letrozole []  [x]    Anastrazole []  [x]    Mammogram [x]  Date:  []  Encouraged to keep yearly mammogram appointments

## 2023-06-28 ENCOUNTER — Telehealth: Payer: Self-pay | Admitting: Adult Health

## 2023-06-28 NOTE — Telephone Encounter (Signed)
Left patient a message in regards to rescheduled appointment times/dates

## 2023-06-30 DIAGNOSIS — C50411 Malignant neoplasm of upper-outer quadrant of right female breast: Secondary | ICD-10-CM | POA: Diagnosis not present

## 2023-06-30 DIAGNOSIS — Z171 Estrogen receptor negative status [ER-]: Secondary | ICD-10-CM | POA: Diagnosis not present

## 2023-07-03 ENCOUNTER — Telehealth: Payer: Self-pay

## 2023-07-03 ENCOUNTER — Other Ambulatory Visit: Payer: Self-pay | Admitting: General Surgery

## 2023-07-03 NOTE — Telephone Encounter (Signed)
[facility] CSW Progress Note  Visual merchandiser returned [employer]. CSW left patient vm informing her that CSW is available to fax documents on 9/26 after her appointment.  Katie Jacobson, [place] Clinical Social Worker [facility]

## 2023-07-06 ENCOUNTER — Ambulatory Visit
Admission: RE | Admit: 2023-07-06 | Discharge: 2023-07-06 | Disposition: A | Discharge status: Home / Self Care | Payer: 59 | Source: Ambulatory Visit | Attending: Radiation Oncology | Admitting: Radiation Oncology

## 2023-07-06 ENCOUNTER — Encounter: Payer: Self-pay | Admitting: Radiation Oncology

## 2023-07-06 ENCOUNTER — Telehealth: Payer: Self-pay

## 2023-07-06 NOTE — Telephone Encounter (Signed)
RN called pt for telephone follow up. Follow up note completed and routed to Dr. Basilio Cairo for review.

## 2023-07-07 ENCOUNTER — Encounter: Payer: 59 | Admitting: Adult Health

## 2023-07-10 ENCOUNTER — Telehealth: Payer: Self-pay

## 2023-07-10 NOTE — Telephone Encounter (Signed)
[facility] CSW Progress Note  Clinical Social Worker made contact with patient. Patient will come in person to fax off documents on 10/04.   Marguerita Merles, LCSWA Clinical Social Worker [facility]

## 2023-07-10 NOTE — Telephone Encounter (Signed)
[facility] CSW Progress Note  Clinical Social Worker  attempted to contact patient  to discuss faxing documents. CSW and patient appear to be missing each others calls.     Marguerita Merles, LCSWA Clinical Social Worker [facility]

## 2023-07-11 NOTE — Pre-Procedure Instructions (Signed)
Surgical Instructions   Your procedure is scheduled on Tuesday, October 8th. Report to [facility] Main Entrance "A" at 2:00 P.M., then check in with the Admitting office. Any questions or running late day of surgery: call [phone]  Questions prior to your surgery date: call [phone], Monday-Friday, 8am-4pm. If you experience any cold or flu symptoms such as cough, fever, chills, shortness of breath, etc. between now and your scheduled surgery, please notify us at the above number.     Remember:  Do not eat after midnight the night before your surgery   You may drink clear liquids until 1:00 PM the day of your surgery.   Clear liquids allowed are: Water, Non-Citrus Juices (without pulp), Carbonated Beverages, Clear Tea, Black Coffee Only (NO MILK, CREAM OR POWDERED CREAMER of any kind), and Gatorade.  Patient Instructions  The night before surgery:  No food after midnight. ONLY clear liquids after midnight  The day of surgery (if you do NOT have diabetes):  Drink ONE (1) Pre-Surgery Clear Ensure by 1:00 PM the day of surgery. Drink in one sitting. Do not sip.  This drink was given to you during your hospital  pre-op appointment visit.  Nothing else to drink after completing the  Pre-Surgery Clear Ensure.         If you have questions, please contact your surgeon's office.    Take these medicines the morning of surgery with A SIP OF WATER  metoprolol succinate (TOPROL-XL)    May take these medicines IF NEEDED: albuterol (VENTOLIN HFA)- bring inhaler with you on day of surgery fluticasone (FLONASE)   Follow your surgeon's instructions on when to stop Asprin.  If no instructions were given by your surgeon then you will need to call the office to get those instructions.     One week prior to surgery, STOP taking any Aleve, Naproxen, Ibuprofen, Motrin, Advil, Goody's, BC's, all herbal medications, fish oil, and non-prescription vitamins.                     Do NOT  Smoke (Tobacco/Vaping) for 24 hours prior to your procedure.  If you use a CPAP at night, you may bring your mask/headgear for your overnight stay.   You will be asked to remove any contacts, glasses, piercing's, hearing aid's, dentures/partials prior to surgery. Please bring cases for these items if needed.    Patients discharged the day of surgery will not be allowed to drive home, and someone needs to stay with them for 24 hours.  SURGICAL WAITING ROOM VISITATION Patients may have no more than 2 support people in the waiting area - these visitors may rotate.   Pre-op nurse will coordinate an appropriate time for 1 ADULT support person, who may not rotate, to accompany patient in pre-op.  Children under the age of 42 must have an adult with them who is not the patient and must remain in the main waiting area with an adult.  If the patient needs to stay at the hospital during part of their recovery, the visitor guidelines for inpatient rooms apply.  Please refer to the [facility] website for the visitor guidelines for any additional information.   If you received a COVID test during your pre-op visit  it is requested that you wear a mask when out in public, stay away from anyone that may not be feeling well and notify your surgeon if you develop symptoms. If you have been in contact with anyone that has  tested positive in the last 10 days please notify you surgeon.      Pre-operative CHG Bathing Instructions   You can play a key role in reducing the risk of infection after surgery. Your skin needs to be as free of germs as possible. You can reduce the number of germs on your skin by washing with CHG (chlorhexidine gluconate) soap before surgery. CHG is an antiseptic soap that kills germs and continues to kill germs even after washing.   DO NOT use if you have an allergy to chlorhexidine/CHG or antibacterial soaps. If your skin becomes reddened or irritated, stop using the CHG and notify  one of our RNs at [phone].              TAKE A SHOWER THE NIGHT BEFORE SURGERY AND THE DAY OF SURGERY    Please keep in mind the following:  DO NOT shave, including legs and underarms, 48 hours prior to surgery.   You may shave your face before/day of surgery.  Place clean sheets on your bed the night before surgery Use a clean washcloth (not used since being washed) for each shower. DO NOT sleep with pet's night before surgery.  CHG Shower Instructions:  Wash your face and private area with normal soap. If you choose to wash your hair, wash first with your normal shampoo.  After you use shampoo/soap, rinse your hair and body thoroughly to remove shampoo/soap residue.  Turn the water OFF and apply half the bottle of CHG soap to a CLEAN washcloth.  Apply CHG soap ONLY FROM YOUR NECK DOWN TO YOUR TOES (washing for 3-5 minutes)  DO NOT use CHG soap on face, private areas, open wounds, or sores.  Pay special attention to the area where your surgery is being performed.  If you are having back surgery, having someone wash your back for you may be helpful. Wait 2 minutes after CHG soap is applied, then you may rinse off the CHG soap.  Pat dry with a clean towel  Put on clean pajamas    Additional instructions for the day of surgery: DO NOT APPLY any lotions, deodorants, cologne, or perfumes.   Do not wear jewelry or makeup Do not wear nail polish, gel polish, artificial nails, or any other type of covering on natural nails (fingers and toes) Do not bring valuables to the hospital. [facility] is not responsible for valuables/personal belongings. Put on clean/comfortable clothes.  Please brush your teeth.  Ask your nurse before applying any prescription medications to the skin.

## 2023-07-12 ENCOUNTER — Encounter (HOSPITAL_COMMUNITY): Payer: Self-pay

## 2023-07-12 ENCOUNTER — Encounter (HOSPITAL_COMMUNITY)
Admission: RE | Admit: 2023-07-12 | Discharge: 2023-07-12 | Disposition: A | Discharge status: Home / Self Care | Payer: Medicaid Other | Source: Ambulatory Visit | Attending: General Surgery | Admitting: General Surgery

## 2023-07-12 ENCOUNTER — Encounter: Payer: Self-pay | Admitting: Hematology and Oncology

## 2023-07-12 ENCOUNTER — Other Ambulatory Visit: Payer: Self-pay

## 2023-07-12 VITALS — BP 134/51 | HR 64 | Temp 98.7°F | Resp 18 | Ht 62.0 in | Wt 209.1 lb

## 2023-07-12 DIAGNOSIS — C50419 Malignant neoplasm of upper-outer quadrant of unspecified female breast: Secondary | ICD-10-CM | POA: Insufficient documentation

## 2023-07-12 DIAGNOSIS — E669 Obesity, unspecified: Secondary | ICD-10-CM | POA: Diagnosis not present

## 2023-07-12 DIAGNOSIS — I080 Rheumatic disorders of both mitral and aortic valves: Secondary | ICD-10-CM | POA: Insufficient documentation

## 2023-07-12 DIAGNOSIS — Z01812 Encounter for preprocedural laboratory examination: Secondary | ICD-10-CM | POA: Diagnosis present

## 2023-07-12 DIAGNOSIS — R011 Cardiac murmur, unspecified: Secondary | ICD-10-CM | POA: Insufficient documentation

## 2023-07-12 DIAGNOSIS — Z01818 Encounter for other preprocedural examination: Secondary | ICD-10-CM

## 2023-07-12 DIAGNOSIS — F17200 Nicotine dependence, unspecified, uncomplicated: Secondary | ICD-10-CM | POA: Diagnosis not present

## 2023-07-12 DIAGNOSIS — Z9221 Personal history of antineoplastic chemotherapy: Secondary | ICD-10-CM | POA: Insufficient documentation

## 2023-07-12 DIAGNOSIS — I10 Essential (primary) hypertension: Secondary | ICD-10-CM | POA: Diagnosis not present

## 2023-07-12 HISTORY — DX: Nonrheumatic mitral (valve) insufficiency: I34.0

## 2023-07-12 LAB — CBC
HCT: 41.5 % (ref 36.0–46.0)
Hemoglobin: 13.6 g/dL (ref 12.0–15.0)
MCH: 30.8 pg (ref 26.0–34.0)
MCHC: 32.8 g/dL (ref 30.0–36.0)
MCV: 94.1 fL (ref 80.0–100.0)
Platelets: 259 10*3/uL (ref 150–400)
RBC: 4.41 MIL/uL (ref 3.87–5.11)
RDW: 16.3 % — ABNORMAL HIGH (ref 11.5–15.5)
WBC: 4.5 10*3/uL (ref 4.0–10.5)
nRBC: 0 % (ref 0.0–0.2)

## 2023-07-12 LAB — BASIC METABOLIC PANEL
Anion gap: 15 (ref 5–15)
BUN: 11 mg/dL (ref 8–23)
CO2: 23 mmol/L (ref 22–32)
Calcium: 9.4 mg/dL (ref 8.9–10.3)
Chloride: 101 mmol/L (ref 98–111)
Creatinine, Ser: 0.87 mg/dL (ref 0.44–1.00)
GFR, Estimated: >60 mL/min (ref >60)
Glucose, Bld: 75 mg/dL (ref 70–99)
Potassium: 3.8 mmol/L (ref 3.5–5.1)
Sodium: 139 mmol/L (ref 135–145)

## 2023-07-12 NOTE — Progress Notes (Signed)
PCP - [name], NP Cardiologist - Dr. Bryan Lemma  PPM/ICD - denies   Chest x-ray - 12/15/22 EKG - 01/26/23- not released- 08/23/22 (released) Stress Test - 07/01/96 ECHO - 05/31/22 Cardiac Cath - denies  Sleep Study - denies   DM- denies  Blood Thinner Instructions: n/a Aspirin Instructions: f/u with surgeon  ERAS Protcol - yes PRE-SURGERY Ensure given at PAT  COVID TEST- n/a   Anesthesia review: yes, cardiac hx  Patient denies shortness of breath, fever, cough and chest pain at PAT appointment   All instructions explained to the patient, with a verbal understanding of the material. Patient agrees to go over the instructions while at home for a better understanding.  The opportunity to ask questions was provided.

## 2023-07-13 ENCOUNTER — Encounter: Payer: Self-pay | Admitting: Hematology and Oncology

## 2023-07-13 NOTE — Anesthesia Preprocedure Evaluation (Addendum)
Anesthesia Evaluation  Patient identified by MRN, date of birth, ID band Patient awake    Reviewed: Allergy & Precautions, NPO status , Patient's Chart, lab work & pertinent test results  History of Anesthesia Complications Negative for: history of anesthetic complications  Airway Mallampati: III  TM Distance: >3 FB Neck ROM: Full   Comment: Previous grade II view with MAC 3, easy mask Dental  (+) Dental Advisory Given, Missing   Pulmonary neg shortness of breath, asthma (no recent inhaler use) , neg sleep apnea, neg COPD, neg recent URI, Current Smoker and Patient abstained from smoking.   Pulmonary exam normal breath sounds clear to auscultation       Cardiovascular hypertension (chlorthalisone, enalapril, metoprolol), Pt. on medications (-) angina (-) Past MI, (-) Cardiac Stents and (-) CABG (-) dysrhythmias + Valvular Problems/Murmurs MR and AI  Rhythm:Regular Rate:Normal  HLD  TTE 05/31/2022: IMPRESSIONS     1. Left ventricular ejection fraction, by estimation, is 60 to 65%. The  left ventricle has normal function. The left ventricle has no regional  wall motion abnormalities. Left ventricular diastolic parameters were  normal. Elevated left ventricular  end-diastolic pressure.   2. Right ventricular systolic function is normal. The right ventricular  size is normal. There is normal pulmonary artery systolic pressure. The  estimated right ventricular systolic pressure is 33.0 mmHg.   3. The mitral valve is rheumatic. Mild mitral valve regurgitation. No  evidence of mitral stenosis.   4. The aortic valve is tricuspid. Aortic valve regurgitation is mild to  moderate. Aortic valve sclerosis/calcification is present, without any  evidence of aortic stenosis. Aortic regurgitation PHT measures 456 msec.   5. The inferior vena cava is normal in size with greater than 50%  respiratory variability, suggesting right atrial pressure  of 3 mmHg.     Neuro/Psych Seizures - (as a child, never on medications),   Neuromuscular disease (cervical radiculopathy)    GI/Hepatic negative GI ROS, Neg liver ROS,,,  Endo/Other  negative endocrine ROS    Renal/GU negative Renal ROS     Musculoskeletal  (+) Arthritis , Osteoarthritis,  Osteoporosis    Abdominal  (+) + obese  Peds  Hematology negative hematology ROS (+)   Anesthesia Other Findings   Reproductive/Obstetrics Breast cancer                             Anesthesia Physical Anesthesia Plan  ASA: 3  Anesthesia Plan: General   Post-op Pain Management:    Induction: Intravenous  PONV Risk Score and Plan: 2 and Ondansetron, Dexamethasone and Treatment may vary due to age or medical condition  Airway Management Planned: LMA  Additional Equipment:   Intra-op Plan:   Post-operative Plan: Extubation in OR  Informed Consent: I have reviewed the patients History and Physical, chart, labs and discussed the procedure including the risks, benefits and alternatives for the proposed anesthesia with the patient or authorized representative who has indicated his/her understanding and acceptance.     Dental advisory given  Plan Discussed with: Anesthesiologist and CRNA  Anesthesia Plan Comments: (PAT note written 07/13/2023 by Shonna Chock, PA-C.  Risks of general anesthesia discussed including, but not limited to, sore throat, hoarse voice, chipped/damaged teeth, injury to vocal cords, nausea and vomiting, allergic reactions, lung infection, heart attack, stroke, and death. All questions answered.   )       Anesthesia Quick Evaluation

## 2023-07-13 NOTE — Progress Notes (Signed)
Anesthesia Chart Review:   Case: [id number] Date/Time: 07/18/23 1545   Procedures:      REMOVAL PORT-A-CATH     CLOSURE CHEST WALL WOUND X2   Anesthesia type: General   Pre-op diagnosis: BREAST CANCER   [facility] / [facility]   Surgeons: Emelia Loron, MD       DISCUSSION: Patient is a 67 year old female scheduled for the above procedure.   History includes smoking, HTN, murmur, breast cancer (right breast 11/12/14, s/p chemotherapy->right lumpectomy 03/02/15, s/p radiation; left breast 11/02/22, s/p left lumpectomy 12/15/22, chemoradiation), PowerPort (left [place] 11/27/14-04/06/15; right internal jugular 12/15/22), childhood seizure.  BMI is consistent with obesity.  She is followed by cardiologist Dr. Herbie Baltimore. 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 Follow-up ~ September 2025 planned.   Anesthesia team to evaluate on the day of surgery.  VS: BP (!) 134/51   Pulse 64   Temp 37.1 C (Oral)   Resp 18   Ht 5\' 2"  (1.575 m)   Wt 94.8 kg   LMP 10/10/1996   SpO2 98%   BMI 38.24 kg/m   PROVIDERS: [name], NP is PCP  [name], MD is HEM-ONC Lonie Peak, MD is RAD-ONC Bryan Lemma, MD is cardiologist. Last visit 09/18/22 for follow-up palpitations, HTN, MVP with mild MR. Mild to moderate AR also noted on 05/2022 echo.  Smoking cessation encouraged.  Next follow-up planned ~ 06/2024.    LABS: Labs reviewed: Acceptable for surgery. (all labs ordered are listed, but only abnormal results are displayed)  Labs Reviewed  CBC - Abnormal; Notable for the following components:      Result Value   RDW 16.3 (*)    All other components within normal limits  BASIC METABOLIC PANEL     IMAGES: CT Abd/pelvis 01/26/23: IMPRESSION: No acute abnormality seen in the  abdomen or pelvis. Aortic Atherosclerosis (ICD10-I70.0).  1V PCXR 12/15/22: IMPRESSION: 1. Right chest port with tip overlying the mid lower SVC. No pneumothorax. 2. Right lung base atelectasis.   EKG: 08/23/22 (Scanned under Media tab): Sinus rhythm with premature atrial complexes.  Possible left atrial enlargement.   CV: Echo 05/31/22: IMPRESSIONS   1. Left ventricular ejection fraction, by estimation, is 60 to 65%. The  left ventricle has normal function. The left ventricle has no regional  wall motion abnormalities. Left ventricular diastolic parameters were  normal. Elevated left ventricular  end-diastolic pressure.   2. Right ventricular systolic function is normal. The right ventricular  size is normal. There is normal pulmonary artery systolic pressure. The  estimated right ventricular systolic pressure is 33.0 mmHg.   3. The mitral valve is rheumatic. Mild mitral valve regurgitation. No  evidence of mitral stenosis.   4. The aortic valve is tricuspid. Aortic valve regurgitation is mild to  moderate. Aortic valve sclerosis/calcification is present, without any  evidence of aortic stenosis. Aortic regurgitation PHT measures 456 msec.   5. The inferior vena cava is normal in size with greater than 50%  respiratory variability, suggesting right atrial pressure of 3 mmHg.    Past Medical History:  Diagnosis Date   Allergy    Arthritis    Breast cancer of upper-outer quadrant of right female breast (HCC) 11/14/2014   Treated with chemotherapy and radiation (diagnosed again in March 2024 in  both breasts)   Heart murmur    History of chemotherapy    finished chemo 02/03/2015   History of kidney stones    History of seizures    as a child - unknown cause - states was never on anticonvulsants   Hypertension    states under control with meds., has been on med. x "years"   Mitral regurgitation    Mitral valve prolapse 1990   Echo 2023 with no MVP. Mild-mod aortic regurg and  mild mitral regurg. 3 yr followup recommended.   Osteoporosis    Personal history of chemotherapy    Personal history of radiation therapy    Seasonal allergies     Past Surgical History:  Procedure Laterality Date   ABDOMINAL HYSTERECTOMY  1998   partial   APPENDECTOMY  1976   BREAST BIOPSY Right 09/30/2022   [place] RT BREAST BX W LOC DEV 1ST LESION IMG BX SPEC US GUIDE 09/30/2022 [facility]   BREAST BIOPSY Left 09/30/2022   [place] LT BREAST BX W LOC DEV 1ST LESION IMG BX SPEC US GUIDE 09/30/2022 [facility]   BREAST BIOPSY Right 09/30/2022   [place] RT BREAST BX W LOC DEV EA ADD LESION IMG BX SPEC US GUIDE 09/30/2022 [facility]   BREAST BIOPSY  12/14/2022   MM LT RADIOACTIVE SEED LOC MAMMO GUIDE 12/14/2022 [facility]   BREAST LUMPECTOMY Right 02/2015   BREAST LUMPECTOMY WITH RADIOACTIVE SEED AND SENTINEL LYMPH NODE BIOPSY Left 12/15/2022   Procedure: LEFT BREAST LUMPECTOMY WITH RADIOACTIVE SEED AND AXILLARY SENTINEL LYMPH NODE BIOPSY;  Surgeon: Emelia Loron, MD;  Location: [facility];  Service: General;  Laterality: Left;   COLONOSCOPY  09/30/2016   KNEE ARTHROSCOPY Left 03/26/2008   MASTECTOMY W/ SENTINEL NODE BIOPSY Right 12/15/2022   Procedure: RIGHT MASTECTOMY WITH RIGHT AXILLARY SENTINEL LYMPH NODE BIOPSY;  Surgeon: Emelia Loron, MD;  Location: [facility];  Service: General;  Laterality: Right;  180 MIN ROOM 9   PLANTAR'S WART EXCISION Left    x 2   PORT-A-CATH REMOVAL Left 04/06/2015   Procedure: REMOVAL PORT-A-CATH;  Surgeon: Emelia Loron, MD;  Location: [facility];  Service: General;  Laterality: Left;   PORTACATH PLACEMENT N/A 11/27/2014   Procedure: INSERTION PORT-A-CATH;  Surgeon: Emelia Loron, MD;  Location: [facility];  Service: General;  Laterality: N/A;   PORTACATH PLACEMENT Right 12/15/2022   Procedure: INSERTION PORT-A-CATH;  Surgeon: Emelia Loron, MD;  Location: [facility] OR;  Service: General;  Laterality: Right;    RADIOACTIVE SEED GUIDED PARTIAL MASTECTOMY WITH AXILLARY SENTINEL LYMPH NODE BIOPSY Right 03/02/2015   Procedure: RADIOACTIVE SEED GUIDED RIGHT PARTIAL MASTECTOMY WITH RIGHT AXILLARY SENTINEL LYMPH NODE BIOPSY;  Surgeon: Emelia Loron, MD;  Location:  SURGERY CENTER;  Service: General;  Laterality: Right;   SALIVARY STONE REMOVAL Right 1972   TONSILLECTOMY  1970s   TRANSTHORACIC ECHOCARDIOGRAM  01/16/2020   EF 60 to 65%.  GRII DD.  No R WMA.  Normal RV size and function.  Rheumatic mitral valve with moderate thickening-hockey-stick appearing.  Moderate MR with mild MS.  Moderate LA dilation.  (Recommend follow-up in 2 to 3 years)   TRANSTHORACIC ECHOCARDIOGRAM  05/31/2022   Normal LV size and function-EF 60 to 65%.  No RWMA. ??  Normal diastolic parameters but elevated LAP?  Normal RV with normal RVP and RAP.  Manage mitral valve with mild MR and no MS.  Mild to moderate AI with aortic sclerosis but no stenosis.   TUBAL  LIGATION  1996    MEDICATIONS:  albuterol (VENTOLIN HFA) 108 (90 Base) MCG/ACT inhaler   aspirin 81 MG tablet   chlorthalidone (HYGROTON) 25 MG tablet   enalapril (VASOTEC) 20 MG tablet   fluticasone (FLONASE) 50 MCG/ACT nasal spray   metoprolol succinate (TOPROL-XL) 100 MG 24 hr tablet   No current facility-administered medications for this encounter.  Advised to clarify ASA instructions with surgeon.   Shonna Chock, PA-C Surgical Short Stay/Anesthesiology [facility] Phone [phone] [facility] Phone [phone] 07/13/2023 1:39 PM

## 2023-07-14 ENCOUNTER — Inpatient Hospital Stay: Payer: Medicaid Other | Attending: Hematology and Oncology | Admitting: Adult Health

## 2023-07-14 ENCOUNTER — Inpatient Hospital Stay: Payer: Medicaid Other

## 2023-07-14 VITALS — BP 145/79 | HR 86 | Temp 97.8°F | Resp 18 | Ht 62.0 in | Wt 211.0 lb

## 2023-07-14 DIAGNOSIS — F17210 Nicotine dependence, cigarettes, uncomplicated: Secondary | ICD-10-CM | POA: Insufficient documentation

## 2023-07-14 DIAGNOSIS — Z79899 Other long term (current) drug therapy: Secondary | ICD-10-CM | POA: Insufficient documentation

## 2023-07-14 DIAGNOSIS — Z1722 Progesterone receptor negative status: Secondary | ICD-10-CM | POA: Diagnosis not present

## 2023-07-14 DIAGNOSIS — G8929 Other chronic pain: Secondary | ICD-10-CM | POA: Insufficient documentation

## 2023-07-14 DIAGNOSIS — I10 Essential (primary) hypertension: Secondary | ICD-10-CM | POA: Diagnosis not present

## 2023-07-14 DIAGNOSIS — Z882 Allergy status to sulfonamides status: Secondary | ICD-10-CM | POA: Diagnosis not present

## 2023-07-14 DIAGNOSIS — Z9049 Acquired absence of other specified parts of digestive tract: Secondary | ICD-10-CM | POA: Diagnosis not present

## 2023-07-14 DIAGNOSIS — I341 Nonrheumatic mitral (valve) prolapse: Secondary | ICD-10-CM | POA: Insufficient documentation

## 2023-07-14 DIAGNOSIS — C50411 Malignant neoplasm of upper-outer quadrant of right female breast: Secondary | ICD-10-CM | POA: Diagnosis present

## 2023-07-14 DIAGNOSIS — Z87442 Personal history of urinary calculi: Secondary | ICD-10-CM | POA: Diagnosis not present

## 2023-07-14 DIAGNOSIS — C50811 Malignant neoplasm of overlapping sites of right female breast: Secondary | ICD-10-CM

## 2023-07-14 DIAGNOSIS — Z803 Family history of malignant neoplasm of breast: Secondary | ICD-10-CM | POA: Insufficient documentation

## 2023-07-14 DIAGNOSIS — C50812 Malignant neoplasm of overlapping sites of left female breast: Secondary | ICD-10-CM

## 2023-07-14 DIAGNOSIS — Z510 Encounter for antineoplastic radiation therapy: Secondary | ICD-10-CM | POA: Diagnosis present

## 2023-07-14 DIAGNOSIS — Z171 Estrogen receptor negative status [ER-]: Secondary | ICD-10-CM | POA: Insufficient documentation

## 2023-07-14 DIAGNOSIS — Z9221 Personal history of antineoplastic chemotherapy: Secondary | ICD-10-CM | POA: Diagnosis not present

## 2023-07-14 DIAGNOSIS — Z17421 Hormone receptor negative with human epidermal growth factor receptor 2 negative status: Secondary | ICD-10-CM | POA: Insufficient documentation

## 2023-07-14 DIAGNOSIS — Z823 Family history of stroke: Secondary | ICD-10-CM | POA: Diagnosis not present

## 2023-07-14 DIAGNOSIS — Z8249 Family history of ischemic heart disease and other diseases of the circulatory system: Secondary | ICD-10-CM | POA: Diagnosis not present

## 2023-07-14 DIAGNOSIS — C50412 Malignant neoplasm of upper-outer quadrant of left female breast: Secondary | ICD-10-CM | POA: Insufficient documentation

## 2023-07-14 DIAGNOSIS — Z833 Family history of diabetes mellitus: Secondary | ICD-10-CM | POA: Insufficient documentation

## 2023-07-14 DIAGNOSIS — Z83719 Family history of colon polyps, unspecified: Secondary | ICD-10-CM | POA: Insufficient documentation

## 2023-07-14 DIAGNOSIS — Z90710 Acquired absence of both cervix and uterus: Secondary | ICD-10-CM | POA: Diagnosis not present

## 2023-07-14 NOTE — Progress Notes (Signed)
[facility] CSW Progress Note  Visual merchandiser met with patient to fax of documents on her behalf to [employer]. Marguerita Merles, LCSWA Clinical Social Worker [facility]

## 2023-07-14 NOTE — Progress Notes (Signed)
SURVIVORSHIP VISIT:  BRIEF ONCOLOGIC HISTORY:  Oncology History  Malignant neoplasm of upper-outer quadrant of right breast in female, estrogen receptor negative (HCC)  10/24/2014 Mammogram   Right breast: ill-defined mass in the UOQ   10/24/2014 Breast US   Right breast: irregular hypoechoic taller than wide mass in the 11 o'clock location, 4 cm from the nipple, measuring 0.9 x 1.1 x 1.2 cm. Evaluation of the right axilla is negative for adenopathy.   11/12/2014 Initial Biopsy   Right breast needle core bx: Invasive ductal carcinoma, ER- (0%), PR- (0%), HER2/neu negative, Ki67 19%, grade 2-3.   11/20/2014 Procedure   Genetic testing: BreastNext panel L[facility]on Collar) reveals no clinically significant variant at ATM, BARD1, BRCA1, BRCA2, BRIP1, CDH1, CHEK2, MRE11A, MUTYH, NBN, NF1, PALB2, PTEN, RAD50, RAD51C, RAD51D, and TP53.   11/21/2014 Breast MRI   Right breast mass measuring up to 1.3 cm without lymphadenopathy or findings to suggest multifocal or multicentric disease. Benign circumscribed oval enhancing mass located posterior medial to the malignancy consistent with fibroadenoma   11/24/2014 Clinical Stage   Stage IA: T1c N0   12/02/2014 - 02/03/2015 Neo-Adjuvant Chemotherapy   Cyclophosphamide and docetaxel x 4 cycles   02/09/2015 Breast MRI   Right breast mass measures 1.0 x 0.7 x 0.5 cm, decreased from 1.4 x 1.4 x 1.0 cm. No additional areas of suspicion in right or left breast.   03/02/2015 Definitive Surgery   Right lumpectomy / SL[facility] Dwain Sarna): IDC, grade 1, with high grade DCIS spanning 1 cmHER2/neu repeated and remains negative (ratio 1.62). 4 LN removed and negative for malignancy (0/4 LN).   03/02/2015 Pathologic Stage   Stage IA: ypT1b ypN0   04/20/2015 - 05/29/2015 Radiation Therapy   Adjuvant RT Basilio Cairo): Right Breast  50 Gy over 25 fractions. Right Breast boost 10 Gy over 5 fractions. Total dose: 60 Gy.   07/28/2015 Survivorship   Survivorship visit completed and copy of care plan  provided to patient.   Bilateral malignant neoplasm of breast in female [facility])  11/02/2022 Initial Diagnosis   Bilateral malignant neoplasm of breast in female [facility])   12/15/2022 Surgery   Left lumpectomy: Grade 3 IDC 2.2 cm, margins negative, ER 0%, PR 0%, HER2 negative, Ki-67 95%, 0/2 lymph nodes negative Right mastectomy: Grade 3 IDC 6.6 cm with DCIS, margins negative, ER 2%, PR 0%, HER2 negative, Ki-67 50%   12/15/2022 Cancer Staging   Staging form: Breast, AJCC 8th Edition - Pathologic stage from 12/15/2022: Stage IIIA (pT3, pN0, cM0, G3, ER-, PR-, HER2-) - Signed by Loa Socks, NP on 07/22/2023 Stage prefix: Initial diagnosis Histologic grading system: 3 grade system   01/18/2023 - 03/24/2023 Chemotherapy   Patient is on Treatment Plan : BREAST TC q21d     05/02/2023 - 06/09/2023 Radiation Therapy   Plan Name: CW_R_Ax_BO Site: Chest Wall, Right Technique: 3D Mode: Photon Dose Per Fraction: 1.8 Gy Prescribed Dose (Delivered / Prescribed): 25.2 Gy / 25.2 Gy Prescribed Fxs (Delivered / Prescribed): 14 / 14   Plan Name: CW_R_Ax Site: Chest Wall, Right Technique: 3D Mode: Photon Dose Per Fraction: 1.8 Gy Prescribed Dose (Delivered / Prescribed): 25.2 Gy / 25.2 Gy Prescribed Fxs (Delivered / Prescribed): 14 / 14   Plan Name: Breast_L Site: Breast, Left Technique: 3D Mode: Photon Dose Per Fraction: 2.67 Gy Prescribed Dose (Delivered / Prescribed): 40.05 Gy / 40.05 Gy Prescribed Fxs (Delivered / Prescribed): 15 / 15   Plan Name: Breast_L_Bst Site: Breast, Left Technique: 3D Mode: Photon Dose Per Fraction: 2  Gy Prescribed Dose (Delivered / Prescribed): 10 Gy / 10 Gy Prescribed Fxs (Delivered / Prescribed): 5 / 5     INTERVAL HISTORY:  Ms. Bray to review her survivorship care plan detailing her treatment course for breast cancer, as well as monitoring long-term side effects of that treatment, education regarding health maintenance, screening, and overall  wellness and health promotion.     Overall, Ms. Brosious reports feeling quite well.  She struggles with right chest wall pain and right arm heaviness.  She moved her daughter this weekend.  She is scheduled for port removal on 07/18/2023.    REVIEW OF SYSTEMS:  Review of Systems  Constitutional:  Negative for appetite change, chills, fatigue, fever and unexpected weight change.  HENT:   Negative for hearing loss, lump/mass and trouble swallowing.   Eyes:  Negative for eye problems and icterus.  Respiratory:  Negative for chest tightness, cough and shortness of breath.   Cardiovascular:  Negative for chest pain, leg swelling and palpitations.  Gastrointestinal:  Negative for abdominal distention, abdominal pain, constipation, diarrhea, nausea and vomiting.  Endocrine: Negative for hot flashes.  Genitourinary:  Negative for difficulty urinating.   Musculoskeletal:  Negative for arthralgias.  Skin:  Negative for itching and rash.  Neurological:  Negative for dizziness, extremity weakness, headaches and numbness.  Hematological:  Negative for adenopathy. Does not bruise/bleed easily.  Psychiatric/Behavioral:  Negative for depression. The patient is not nervous/anxious.   Breast: Denies any new nodularity, masses, tenderness, nipple changes, or nipple discharge.       PAST MEDICAL/SURGICAL HISTORY:  Past Medical History:  Diagnosis Date   Allergy    Arthritis    Breast cancer of upper-outer quadrant of right female breast (HCC) 11/14/2014   Treated with chemotherapy and radiation (diagnosed again in March 2024 in both breasts)   Heart murmur    History of chemotherapy    finished chemo 02/03/2015   History of kidney stones    History of seizures    as a child - unknown cause - states was never on anticonvulsants   Hypertension    states under control with meds., has been on med. x "years"   Mitral regurgitation    Mitral valve prolapse 1990   Echo 2023 with no MVP. Mild-mod aortic  regurg and mild mitral regurg. 3 yr followup recommended.   Osteoporosis    Personal history of chemotherapy    Personal history of radiation therapy    Seasonal allergies    Past Surgical History:  Procedure Laterality Date   ABDOMINAL HYSTERECTOMY  1998   partial   APPENDECTOMY  1976   BREAST BIOPSY Right 09/30/2022   [place] RT BREAST BX W LOC DEV 1ST LESION IMG BX SPEC US GUIDE 09/30/2022 [facility]   BREAST BIOPSY Left 09/30/2022   [place] LT BREAST BX W LOC DEV 1ST LESION IMG BX SPEC US GUIDE 09/30/2022 [facility]   BREAST BIOPSY Right 09/30/2022   [place] RT BREAST BX W LOC DEV EA ADD LESION IMG BX SPEC US GUIDE 09/30/2022 [facility]   BREAST BIOPSY  12/14/2022   MM LT RADIOACTIVE SEED LOC MAMMO GUIDE 12/14/2022 [facility]   BREAST LUMPECTOMY Right 02/2015   BREAST LUMPECTOMY WITH RADIOACTIVE SEED AND SENTINEL LYMPH NODE BIOPSY Left 12/15/2022   Procedure: LEFT BREAST LUMPECTOMY WITH RADIOACTIVE SEED AND AXILLARY SENTINEL LYMPH NODE BIOPSY;  Surgeon: Emelia Loron, MD;  Location: [facility] OR;  Service: General;  Laterality: Left;   COLONOSCOPY  09/30/2016  COMPLEX WOUND CLOSURE N/A 07/18/2023   Procedure: CLOSURE CHEST WALL WOUND X2;  Surgeon: Emelia Loron, MD;  Location: [facility] OR;  Service: General;  Laterality: N/A;   KNEE ARTHROSCOPY Left 03/26/2008   MASTECTOMY W/ SENTINEL NODE BIOPSY Right 12/15/2022   Procedure: RIGHT MASTECTOMY WITH RIGHT AXILLARY SENTINEL LYMPH NODE BIOPSY;  Surgeon: Emelia Loron, MD;  Location: [facility] OR;  Service: General;  Laterality: Right;  180 MIN ROOM 9   PLANTAR'S WART EXCISION Left    x 2   PORT-A-CATH REMOVAL Left 04/06/2015   Procedure: REMOVAL PORT-A-CATH;  Surgeon: Emelia Loron, MD;  Location: [facility];  Service: General;  Laterality: Left;   PORT-A-CATH REMOVAL N/A 07/18/2023   Procedure: REMOVAL PORT-A-CATH;  Surgeon: Emelia Loron, MD;  Location: [facility] OR;  Service: General;  Laterality: N/A;    PORTACATH PLACEMENT N/A 11/27/2014   Procedure: INSERTION PORT-A-CATH;  Surgeon: Emelia Loron, MD;  Location: [facility];  Service: General;  Laterality: N/A;   PORTACATH PLACEMENT Right 12/15/2022   Procedure: INSERTION PORT-A-CATH;  Surgeon: Emelia Loron, MD;  Location[facility] OR;  Service: General;  Laterality: Right;   RADIOACTIVE SEED GUIDED PARTIAL MASTECTOMY WITH AXILLARY SENTINEL LYMPH NODE BIOPSY Right 03/02/2015   Procedure: RADIOACTIVE SEED GUIDED RIGHT PARTIAL MASTECTOMY WITH RIGHT AXILLARY SENTINEL LYMPH NODE BIOPSY;  Surgeon: Emelia Loron, MD;  Location: [facility];  Service: General;  Laterality: Right;   SALIVARY STONE REMOVAL Right 1972   TONSILLECTOMY  1970s   TRANSTHORACIC ECHOCARDIOGRAM  01/16/2020   EF 60 to 65%.  GRII DD.  No R WMA.  Normal RV size and function.  Rheumatic mitral valve with moderate thickening-hockey-stick appearing.  Moderate MR with mild MS.  Moderate LA dilation.  (Recommend follow-up in 2 to 3 years)   TRANSTHORACIC ECHOCARDIOGRAM  05/31/2022   Normal LV size and function-EF 60 to 65%.  No RWMA. ??  Normal diastolic parameters but elevated LAP?  Normal RV with normal RVP and RAP.  Manage mitral valve with mild MR and no MS.  Mild to moderate AI with aortic sclerosis but no stenosis.   TUBAL LIGATION  1996     ALLERGIES:  Allergies  Allergen Reactions   Sulfa Antibiotics Other (See Comments)    UNKNOWN     CURRENT MEDICATIONS:  Outpatient Encounter Medications as of 07/14/2023  Medication Sig   albuterol (VENTOLIN HFA) 108 (90 Base) MCG/ACT inhaler Inhale 1-2 puffs into the lungs every 6 (six) hours as needed for wheezing or shortness of breath.   aspirin 81 MG tablet Take 1 tablet (81 mg total) by mouth daily.   chlorthalidone (HYGROTON) 25 MG tablet TAKE 1 TABLET(25 MG) BY MOUTH EVERY OTHER DAY (Patient taking differently: Take 25 mg by mouth every Monday, Wednesday, and Friday.)   enalapril (VASOTEC) 20  MG tablet TAKE 1 TABLET BY MOUTH EVERYDAY AT BEDTIME   fluticasone (FLONASE) 50 MCG/ACT nasal spray Place 2 sprays into both nostrils as needed. (Patient taking differently: Place 2 sprays into both nostrils daily as needed for allergies or rhinitis.)   metoprolol succinate (TOPROL-XL) 100 MG 24 hr tablet Take 1 tablet (100 mg total) by mouth daily. Take with or immediately following a meal. (Patient taking differently: Take 100 mg by mouth at bedtime. Take with or immediately following a meal.)   No facility-administered encounter medications on file as of 07/14/2023.     ONCOLOGIC FAMILY HISTORY:  Family History  Problem Relation Age of Onset   Heart disease Mother  Stroke Mother    Hypertension Mother    Diabetes Mother    Diabetes Sister    Kidney disease Brother    Mental retardation Brother    Diabetes Sister    Heart attack Father    Breast cancer Cousin        deceased 34   Colon polyps Daughter    Colon cancer Neg Hx    Esophageal cancer Neg Hx    Rectal cancer Neg Hx    Stomach cancer Neg Hx      SOCIAL HISTORY:  Social History   Socioeconomic History   Marital status: Married    Spouse name: Not on file   Number of children: 1   Years of education: Not on file   Highest education level: Not on file  Occupational History   Not on file  Tobacco Use   Smoking status: Every Day    Current packs/day: 0.50    Average packs/day: 0.5 packs/day for 43.0 years (21.5 ttl pk-yrs)    Types: Cigarettes   Smokeless tobacco: Never  Vaping Use   Vaping status: Never Used  Substance and Sexual Activity   Alcohol use: Yes    Alcohol/week: 5.0 standard drinks of alcohol    Types: 5 Standard drinks or equivalent per week    Comment: occ    Drug use: No   Sexual activity: Yes    Partners: Male  Other Topics Concern   Not on file  Social History Narrative   Married with 2 daughters- born 41 and 27 ([name] died in MVA 08-23-16)   Previously worked -- Therapist, 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  Has been doing part time work with [employer] -- had 6 active clients, but the most important one is her Daughter.   Regular exercise:  No   Caffeine Use: none; Current 1ppd smoker x > 39 yrs; EtOH ~4 oz/week   Social Determinants of Health   Financial Resource Strain: Not on file  Food Insecurity: No Food Insecurity (04/11/2023)   Hunger Vital Sign    Worried About Running Out of Food in the Last Year: Never true    Ran Out of Food in the Last Year: Never true  Transportation Needs: No Transportation Needs (04/11/2023)   PRAPARE - Administrator, Civil Service (Medical): No    Lack of Transportation (Non-Medical): No  Physical Activity: Not on file  Stress: Not on file  Social Connections: Unknown (02/22/2022)   [facility] from [facility]   Social Network    Social Network: Not on file  Intimate Partner Violence: Not At Risk (04/11/2023)   Humiliation, Afraid, Rape, and Kick questionnaire    Fear of Current or Ex-Partner: No    Emotionally Abused: No    Physically Abused: No    Sexually Abused: No     OBSERVATIONS/OBJECTIVE:  BP (!) 145/79 (BP Location: Left Arm, Patient Position: Sitting)   Pulse 86   Temp 97.8 F (36.6 C) (Temporal)   Resp 18   Ht 5\' 2"  (1.575 m)   Wt 211 lb (95.7 kg)   LMP 10/10/1996   SpO2 100%   BMI 38.59 kg/m  GENERAL: Patient is a well appearing female in no acute distress HEENT:  Sclerae anicteric.  Oropharynx clear and moist. No ulcerations or evidence of oropharyngeal candidiasis. Neck is supple.  NODES:  No cervical, supraclavicular, or axillary lymphadenopathy palpated.  BREAST EXAM:  Deferred. LUNGS:  Clear to auscultation bilaterally.  No wheezes or rhonchi. HEART:  Regular rate and rhythm. No murmur appreciated. ABDOMEN:  Soft, nontender.  Positive, normoactive bowel sounds. No organomegaly palpated. MSK:  No focal spinal tenderness to palpation. Full range of motion  bilaterally in the upper extremities. EXTREMITIES:  No peripheral edema.   SKIN:  Clear with no obvious rashes or skin changes. No nail dyscrasia. NEURO:  Nonfocal. Well oriented.  Appropriate affect.   LABORATORY DATA:  None for this visit.  DIAGNOSTIC IMAGING:  None for this visit.   ASSESSMENT AND PLAN:  Ms.. Katie Jacobson is a pleasant 67 y.o. female with bilateral breast cancer, stage IIIA triple negative, diagnosed in 10/2022, treated with left lumpectomy, right mastectomy, adjuvant chemotherapy, and adjuvant radiation therapy.  She presents to the Survivorship Clinic for our initial meeting and routine follow-up post-completion of treatment for breast cancer.   1. Bilateral triple negative breast cancer:  Ms. Fronczak is continuing to recover from definitive treatment for breast cancer. She will follow-up with her medical oncologist, Dr.  Pamelia Hoit in 3-6 months with history and physical exam per surveillance protocol.  Her mammogram is due 10/2023; orders placed today.   Today, a comprehensive survivorship care plan and treatment summary was reviewed with the patient today detailing her breast cancer diagnosis, treatment course, potential late/long-term effects of treatment, appropriate follow-up care with recommendations for the future, and patient education resources.  A copy of this summary, along with a letter will be sent to the patient's primary care provider via mail/fax/In Basket message after today's visit.    2. Chronic pain: She has been working with PT and is joining [employer].  She is working through this and is doing well (she wants to avoid taking meds to help with managing the discomfort).   3. Bone health:    She was given education on specific activities to promote bone health.  4. Cancer screening:  Due to Ms. Wehrli's history and her age, she should receive screening for skin cancers, colon cancer, and gynecologic cancers.  The information and recommendations are listed on the  patient's comprehensive care plan/treatment summary and were reviewed in detail with the patient.  She is eligible for lung cancer screening and is going to consider this and f/u with her PCP to discuss further.    5. Health maintenance and wellness promotion: Ms. Kasik was encouraged to consume 5-7 servings of fruits and vegetables per day. We reviewed the "Nutrition Rainbow" handout.  She was also encouraged to engage in moderate to vigorous exercise for 30 minutes per day most days of the week.  She was instructed to limit her alcohol consumption and continue to abstain from tobacco use.     6. Support services/counseling: It is not uncommon for this period of the patient's cancer care trajectory to be one of many emotions and stressors.   She was given information regarding our available services and encouraged to contact me with any questions or for help enrolling in any of our support group/programs.    Follow up instructions:    -Return to cancer center in 3-6 months  -Mammogram due in 10/2023 -Will send referral to [facility] per Gladine's request -She is welcome to return back to the Survivorship Clinic at any time; no additional follow-up needed at this time.  -Consider referral back to survivorship as a long-term survivor for continued surveillance  The patient was provided an opportunity to ask questions and all were answered. The patient agreed with the plan  and demonstrated an understanding of the instructions.   Total encounter time:50 minutes*in face-to-face visit time, chart review, lab review, care coordination, order entry, and documentation of the encounter time.    Lillard Anes, NP 07/22/23 12:59 PM Medical Oncology and Hematology [facility] [address] [place], [place] [address] Tel. [phone]    Fax. [phone]  *Total Encounter Time as defined by the Centers for Medicare and Medicaid Services includes, in addition to the face-to-face time of  a patient visit (documented in the note above) non-face-to-face time: obtaining and reviewing outside history, ordering and reviewing medications, tests or procedures, care coordination (communications with other health care professionals or caregivers) and documentation in the medical record.

## 2023-07-18 ENCOUNTER — Other Ambulatory Visit: Payer: Self-pay

## 2023-07-18 ENCOUNTER — Encounter (HOSPITAL_COMMUNITY): Payer: Self-pay | Admitting: General Surgery

## 2023-07-18 ENCOUNTER — Encounter (HOSPITAL_COMMUNITY)
Admission: RE | Disposition: A | Discharge status: Home / Self Care | Payer: Self-pay | Source: Home / Self Care | Attending: General Surgery

## 2023-07-18 ENCOUNTER — Ambulatory Visit (HOSPITAL_BASED_OUTPATIENT_CLINIC_OR_DEPARTMENT_OTHER): Payer: Medicaid Other | Admitting: Registered Nurse

## 2023-07-18 ENCOUNTER — Ambulatory Visit (HOSPITAL_COMMUNITY)
Admission: RE | Admit: 2023-07-18 | Discharge: 2023-07-18 | Disposition: A | Discharge status: Home / Self Care | Payer: Medicaid Other | Attending: General Surgery | Admitting: General Surgery

## 2023-07-18 ENCOUNTER — Ambulatory Visit (HOSPITAL_COMMUNITY): Payer: Medicaid Other | Admitting: Vascular Surgery

## 2023-07-18 DIAGNOSIS — F17210 Nicotine dependence, cigarettes, uncomplicated: Secondary | ICD-10-CM | POA: Insufficient documentation

## 2023-07-18 DIAGNOSIS — Z9011 Acquired absence of right breast and nipple: Secondary | ICD-10-CM | POA: Diagnosis not present

## 2023-07-18 DIAGNOSIS — Z452 Encounter for adjustment and management of vascular access device: Secondary | ICD-10-CM | POA: Diagnosis present

## 2023-07-18 DIAGNOSIS — Z9221 Personal history of antineoplastic chemotherapy: Secondary | ICD-10-CM | POA: Insufficient documentation

## 2023-07-18 DIAGNOSIS — I10 Essential (primary) hypertension: Secondary | ICD-10-CM | POA: Diagnosis not present

## 2023-07-18 DIAGNOSIS — Z853 Personal history of malignant neoplasm of breast: Secondary | ICD-10-CM | POA: Insufficient documentation

## 2023-07-18 DIAGNOSIS — I080 Rheumatic disorders of both mitral and aortic valves: Secondary | ICD-10-CM | POA: Insufficient documentation

## 2023-07-18 DIAGNOSIS — Z923 Personal history of irradiation: Secondary | ICD-10-CM | POA: Diagnosis not present

## 2023-07-18 HISTORY — PX: PORT-A-CATH REMOVAL: SHX5289

## 2023-07-18 HISTORY — PX: COMPLEX WOUND CLOSURE: SHX6446

## 2023-07-18 SURGERY — REMOVAL PORT-A-CATH
Anesthesia: General | Site: Chest

## 2023-07-18 MED ORDER — FENTANYL CITRATE (PF) 100 MCG/2ML IJ SOLN
25.0000 ug | INTRAMUSCULAR | Status: DC | PRN
  Administered 2023-07-18: 50 ug via INTRAVENOUS

## 2023-07-18 MED ORDER — ORAL CARE MOUTH RINSE: 15.0000 mL | Freq: Once | OROMUCOSAL | Status: AC

## 2023-07-18 MED ORDER — PROPOFOL 10 MG/ML IV BOLUS
INTRAVENOUS | Status: AC
  Filled 2023-07-18: qty 20

## 2023-07-18 MED ORDER — SODIUM CHLORIDE 0.9 % IR SOLN
Status: DC | PRN
Start: 2023-07-18 — End: 2023-07-18
  Administered 2023-07-18: 1000 mL

## 2023-07-18 MED ORDER — FENTANYL CITRATE (PF) 250 MCG/5ML IJ SOLN
INTRAMUSCULAR | Status: AC
  Filled 2023-07-18: qty 5

## 2023-07-18 MED ORDER — MIDAZOLAM HCL 2 MG/2ML IJ SOLN
INTRAMUSCULAR | Status: AC
  Filled 2023-07-18: qty 2

## 2023-07-18 MED ORDER — BUPIVACAINE-EPINEPHRINE 0.25% -1:200000 IJ SOLN
INTRAMUSCULAR | Status: DC | PRN
  Administered 2023-07-18: 4 mL

## 2023-07-18 MED ORDER — PHENYLEPHRINE HCL-NACL 20-0.9 MG/250ML-% IV SOLN
INTRAVENOUS | Status: DC | PRN
  Administered 2023-07-18 (×3): 160 ug via INTRAVENOUS
  Administered 2023-07-18: 50 ug/min via INTRAVENOUS

## 2023-07-18 MED ORDER — ACETAMINOPHEN 500 MG PO TABS
1000.0000 mg | ORAL_TABLET | ORAL | Status: AC
  Administered 2023-07-18: 1000 mg via ORAL
  Filled 2023-07-18: qty 2

## 2023-07-18 MED ORDER — LACTATED RINGERS IV SOLN: INTRAVENOUS | Status: DC

## 2023-07-18 MED ORDER — ENSURE PRE-SURGERY PO LIQD: 296.0000 mL | Freq: Once | ORAL | Status: DC

## 2023-07-18 MED ORDER — CHLORHEXIDINE GLUCONATE CLOTH 2 % EX PADS: 6.0000 | MEDICATED_PAD | Freq: Once | CUTANEOUS | Status: DC

## 2023-07-18 MED ORDER — MIDAZOLAM HCL 2 MG/2ML IJ SOLN
INTRAMUSCULAR | Status: DC | PRN
  Administered 2023-07-18: 2 mg via INTRAVENOUS

## 2023-07-18 MED ORDER — OXYCODONE HCL 5 MG/5ML PO SOLN: 5.0000 mg | Freq: Once | ORAL | Status: AC | PRN

## 2023-07-18 MED ORDER — OXYCODONE HCL 5 MG PO TABS
5.0000 mg | ORAL_TABLET | Freq: Once | ORAL | Status: AC | PRN
  Administered 2023-07-18: 5 mg via ORAL

## 2023-07-18 MED ORDER — OXYCODONE HCL 5 MG PO TABS
ORAL_TABLET | ORAL | Status: AC
  Filled 2023-07-18: qty 1

## 2023-07-18 MED ORDER — PROPOFOL 10 MG/ML IV BOLUS
INTRAVENOUS | Status: DC | PRN
  Administered 2023-07-18: 150 mg via INTRAVENOUS

## 2023-07-18 MED ORDER — LIDOCAINE 2% (20 MG/ML) 5 ML SYRINGE
INTRAMUSCULAR | Status: DC | PRN
  Administered 2023-07-18: 100 mg via INTRAVENOUS

## 2023-07-18 MED ORDER — BUPIVACAINE-EPINEPHRINE (PF) 0.25% -1:200000 IJ SOLN
INTRAMUSCULAR | Status: AC
  Filled 2023-07-18: qty 30

## 2023-07-18 MED ORDER — CEFAZOLIN SODIUM-DEXTROSE 2-4 GM/100ML-% IV SOLN
2.0000 g | INTRAVENOUS | Status: AC
  Administered 2023-07-18: 2 g via INTRAVENOUS
  Filled 2023-07-18: qty 100

## 2023-07-18 MED ORDER — FENTANYL CITRATE (PF) 250 MCG/5ML IJ SOLN
INTRAMUSCULAR | Status: DC | PRN
  Administered 2023-07-18: 50 ug via INTRAVENOUS
  Administered 2023-07-18 (×2): 25 ug via INTRAVENOUS
  Administered 2023-07-18: 50 ug via INTRAVENOUS

## 2023-07-18 MED ORDER — FENTANYL CITRATE (PF) 100 MCG/2ML IJ SOLN
INTRAMUSCULAR | Status: AC
  Filled 2023-07-18: qty 2

## 2023-07-18 MED ORDER — CHLORHEXIDINE GLUCONATE 0.12 % MT SOLN
15.0000 mL | Freq: Once | OROMUCOSAL | Status: AC
  Administered 2023-07-18: 15 mL via OROMUCOSAL
  Filled 2023-07-18: qty 15

## 2023-07-18 SURGICAL SUPPLY — 35 items
ADH SKN CLS APL DERMABOND .7 (GAUZE/BANDAGES/DRESSINGS) ×1
APL PRP STRL LF ISPRP CHG 10.5 (MISCELLANEOUS) ×1
APPLICATOR CHLORAPREP 10.5 ORG (MISCELLANEOUS) ×1 IMPLANT
BAG COUNTER SPONGE SURGICOUNT (BAG) ×1 IMPLANT
BAG SPNG CNTER NS LX DISP (BAG) ×1
COVER SURGICAL LIGHT HANDLE (MISCELLANEOUS) ×1 IMPLANT
DERMABOND ADVANCED .7 DNX12 (GAUZE/BANDAGES/DRESSINGS) ×1 IMPLANT
DRAPE LAPAROTOMY 100X72 PEDS (DRAPES) ×1 IMPLANT
DRSG TEGADERM 2-3/8X2-3/4 SM (GAUZE/BANDAGES/DRESSINGS) IMPLANT
ELECT CAUTERY BLADE 6.4 (BLADE) ×1 IMPLANT
ELECT REM PT RETURN 9FT ADLT (ELECTROSURGICAL) ×1
ELECTRODE REM PT RTRN 9FT ADLT (ELECTROSURGICAL) ×1 IMPLANT
GAUZE 4X4 16PLY ~~LOC~~+RFID DBL (SPONGE) ×1 IMPLANT
GAUZE SPONGE 2X2 STRL 8-PLY (GAUZE/BANDAGES/DRESSINGS) IMPLANT
GLOVE BIO SURGEON STRL SZ7 (GLOVE) ×1 IMPLANT
GLOVE BIOGEL PI IND STRL 7.5 (GLOVE) ×1 IMPLANT
GOWN STRL REUS W/ TWL LRG LVL3 (GOWN DISPOSABLE) ×2 IMPLANT
GOWN STRL REUS W/TWL LRG LVL3 (GOWN DISPOSABLE) ×2
KIT BASIN OR (CUSTOM PROCEDURE TRAY) ×1 IMPLANT
KIT TURNOVER KIT B (KITS) ×1 IMPLANT
NDL HYPO 25GX1X1/2 BEV (NEEDLE) ×1 IMPLANT
NEEDLE HYPO 25GX1X1/2 BEV (NEEDLE) ×1
NS IRRIG 1000ML POUR BTL (IV SOLUTION) ×1 IMPLANT
PACK GENERAL/GYN (CUSTOM PROCEDURE TRAY) ×1 IMPLANT
PAD ARMBOARD 7.5X6 YLW CONV (MISCELLANEOUS) ×2 IMPLANT
PENCIL SMOKE EVACUATOR (MISCELLANEOUS) ×1 IMPLANT
SPIKE FLUID TRANSFER (MISCELLANEOUS) ×1 IMPLANT
STRIP CLOSURE SKIN 1/2X4 (GAUZE/BANDAGES/DRESSINGS) IMPLANT
SUT ETHILON 2 0 FS 18 (SUTURE) IMPLANT
SUT MNCRL AB 4-0 PS2 18 (SUTURE) ×1 IMPLANT
SUT VIC AB 3-0 SH 27 (SUTURE) ×2
SUT VIC AB 3-0 SH 27X BRD (SUTURE) ×1 IMPLANT
SYR CONTROL 10ML LL (SYRINGE) ×1 IMPLANT
TOWEL GREEN STERILE (TOWEL DISPOSABLE) ×1 IMPLANT
TOWEL GREEN STERILE FF (TOWEL DISPOSABLE) ×1 IMPLANT

## 2023-07-18 NOTE — Interval H&P Note (Signed)
History and Physical Interval Note:  07/18/2023 2:09 PM  Katie Jacobson  has presented today for surgery, with the diagnosis of BREAST CANCER.  The various methods of treatment have been discussed with the patient and family. After consideration of risks, benefits and other options for treatment, the patient has consented to  Procedure(s): REMOVAL PORT-A-CATH (N/A) CLOSURE CHEST WALL WOUND X2 (N/A) as a surgical intervention.  The patient's history has been reviewed, patient examined, no change in status, stable for surgery.  I have reviewed the patient's chart and labs.  Questions were answered to the patient's satisfaction.     Emelia Loron

## 2023-07-18 NOTE — Transfer of Care (Signed)
Immediate Anesthesia Transfer of Care Note  Patient: SHATAVIA SANTOR  Procedure(s) Performed: REMOVAL PORT-A-CATH (Chest) CLOSURE CHEST WALL WOUND X2 (Chest)  Patient Location: PACU  Anesthesia Type:General  Level of Consciousness: awake  Airway & Oxygen Therapy: Patient Spontanous Breathing  Post-op Assessment: Report given to RN and Post -op Vital signs reviewed and stable  Post vital signs: Reviewed and stable  Last Vitals:  Vitals Value Taken Time  BP 115/68 07/18/23 1535  Temp 36.4 C 07/18/23 1535  Pulse 64 07/18/23 1536  Resp 13 07/18/23 1536  SpO2 99 % 07/18/23 1536  Vitals shown include unfiled device data.  Last Pain:  Vitals:   07/18/23 1535  TempSrc:   PainSc: 0-No pain      Patients Stated Pain Goal: 3 (07/18/23 1222)  Complications: No notable events documented.

## 2023-07-18 NOTE — H&P (Signed)
  67 year old female who has undergone a right mastectomy for recurrent cancer. I was unable to do a sentinel node at the same time as she did not map. The right side is a 6.6 cm grade 3 invasive ductal carcinoma with negative margins. This was a functional triple negative tumor with a Ki-67 of 50%. The left side ends up being a grade three 2.2 cm invasive ductal carcinoma that was clear at the margins. She had a lumpectomy and sentinel node biopsy in the side. She had 2 sentinel nodes that were negative. She has completed chemo. During chemo had 2 small areas open and drain. No infection. These improved and she has now undergone radiotherapy. Still small amt of drainage from one of the areas. Otherwise doing ok  Medical History: Past Medical History:  Diagnosis Date  Arthritis  History of cancer   Patient Active Problem List  Diagnosis  Malignant neoplasm of upper-outer quadrant of right breast in female, estrogen receptor negative (CMS/HHS-HCC)   Past Surgical History:  Procedure Laterality Date  APPENDECTOMY  DEEP AXILLARY SENTINEL NODE BIOPSY / EXCISION Right  DEEP AXILLARY SENTINEL NODE BIOPSY / EXCISION Left  HYSTERECTOMY  JOINT REPLACEMENT  MASTECTOMY PARTIAL / LUMPECTOMY Right  MASTECTOMY PARTIAL / LUMPECTOMY Left  MASTECTOMY SIMPLE Right   Allergies  Allergen Reactions  Sulfa (Sulfonamide Antibiotics) Other (See Comments)  UNKNOWN   Current Outpatient Medications on File Prior to Visit  Medication Sig Dispense Refill  albuterol 90 mcg/actuation inhaler Inhale into the lungs  chlorthalidone 25 MG tablet TAKE 1 TABLET(25 MG) BY MOUTH EVERY OTHER DAY  enalapril (VASOTEC) 20 MG tablet TAKE 1 TABLET(20 MG) BY MOUTH AT BEDTIME  fluticasone propionate (FLONASE) 50 mcg/actuation nasal spray Place 2 sprays into one nostril    Family History  Problem Relation Age of Onset  Diabetes Sister    Social History   Tobacco Use  Smoking Status Every Day  Types: Cigarettes   Smokeless Tobacco Not on file  Marital status: Married  Tobacco Use  Smoking status: Every Day  Types: Cigarettes  Substance and Sexual Activity  Alcohol use: Yes  Drug use: Not Currently   Objective:  Vitals:  06/30/23 1007   Physical Exam   Left breast without mass, no ax adenopathy Right mastectomy with evidence radiotherapy, she has two small pinholes in incision  Assessment and Plan:   She is overall doing well. She would like to have her port removed. I will check with oncology. She still has 2 small pinholes in her incision. I think to fix this now would be very difficult but if we get her port out we can try to at least suture them back together and just debride them a little bit. She understands that this may recur. We discussed at length how she can best take care of herself with making sure she gets sleep, eating well, and some walking.

## 2023-07-18 NOTE — Anesthesia Postprocedure Evaluation (Signed)
Anesthesia Post Note  Patient: Katie Jacobson  Procedure(s) Performed: REMOVAL PORT-A-CATH (Chest) CLOSURE CHEST WALL WOUND X2 (Chest)     Patient location during evaluation: PACU Anesthesia Type: General Level of consciousness: awake and alert Pain management: pain level controlled Vital Signs Assessment: post-procedure vital signs reviewed and stable Respiratory status: spontaneous breathing, nonlabored ventilation, respiratory function stable and patient connected to nasal cannula oxygen Cardiovascular status: blood pressure returned to baseline and stable Postop Assessment: no apparent nausea or vomiting Anesthetic complications: no   No notable events documented.  Last Vitals:  Vitals:   07/18/23 1630 07/18/23 1645  BP: 139/68 (!) 143/72  Pulse: (!) 59 (!) 58  Resp: 15 20  Temp:  36.7 C  SpO2: 97% 98%    Last Pain:  Vitals:   07/18/23 1630  TempSrc:   PainSc: 4                  Collene Schlichter

## 2023-07-18 NOTE — Discharge Instructions (Signed)
     POST OP INSTRUCTIONS   Take your usually prescribed medications unless otherwise directed. You should follow a light diet for the remainder of the day after your procedure. Most patients will experience some mild swelling and/or bruising in the area of the incision. It may take several days to resolve. It is common to experience some constipation if taking pain medication after surgery. Increasing fluid intake and taking a stool softener (such as Colace) will usually help or prevent this problem from occurring. A mild laxative (Milk of Magnesia or Miralax) should be taken according to package directions if there are no bowel movements after 48 hours.  Unless discharge instructions indicate otherwise, you may remove your bandages 48 hours after surgery, and you may shower at that time. You may have steri-strips (small white skin tapes) in place directly over the incision.  These strips should be left on the skin for 7-10 days.  If your surgeon used Dermabond (skin glue) on the incision, you may shower in 24 hours.  The glue will flake off over the next 2-3 weeks.  ACTIVITIES:  Limit activity involving your arms for the next 72 hours. Do no strenuous exercise or activity for 1 week. You may drive when you are no longer taking prescription pain medication, you can comfortably wear a seatbelt, and you can maneuver your car.    WHEN TO CALL YOUR DOCTOR [phone]): Fever over 101.0 Chills Continued bleeding from incision Increased redness and tenderness at the site Shortness of breath, difficulty breathing   The clinic staff is available to answer your questions during regular business hours. Please don't hesitate to call and ask to speak to one of the nurses or medical assistants for clinical concerns. If you have a medical emergency, go to the nearest emergency room or call 911.  A surgeon from [facility] is always on call at the hospital.     For further information,  please visit [email or web address]

## 2023-07-18 NOTE — Op Note (Signed)
Preoperative diagnosis: port no longer needs venous access, open wound Postoperative diagnosis: same as above Procedure:  Port removal Closure of two small chest wall wounds, < 5 mm Surgeon: Dr Harden Mo Anesthesia general Complications none Drains none Specimens none Sponge and needle count correct Dispo recovery stable  Indications:  67 year old female who has undergone a right mastectomy for recurrent cancer. I was unable to do a sentinel node at the same time as she did not map. The right side is a 6.6 cm grade 3 invasive ductal carcinoma with negative margins. This was a functional triple negative tumor with a Ki-67 of 50%. The left side ends up being a grade three 2.2 cm invasive ductal carcinoma that was clear at the margins. She had a lumpectomy and sentinel node biopsy in the side. She had 2 sentinel nodes that were negative. She has completed chemo. During chemo had 2 small areas open and drain. We discussed port removal and closure of wounds.  Procedure: After informed consent obtained she was taken to the OR.  She was given antibiotics.  SCDs were in place.  She was placed under general anesthesia by choice. She was prepped with betadine.  Timeout was performed  I infiltrated marcaine at the site of the old port. I then remove the port, line, suture material in their entirety.  Hemostasis was obtained.  I then closed this with 3-0 Vicryl for Monocryl.  Glue and Steri-Strips were applied.  I then debrided both of the small 2 mm wounds on the chest.  These were both bleeding and healthy.  I then closed these with 2-0 nylon suture.  Dressings were placed.  She tolerated this well was transferred recovery stable.

## 2023-07-18 NOTE — Anesthesia Procedure Notes (Signed)
Procedure Name: LMA Insertion Date/Time: 07/18/2023 2:52 PM  Performed by: Loleta Arieon Corcoran, CRNAPre-anesthesia Checklist: Patient identified, Patient being monitored, Timeout performed, Emergency Drugs available and Suction available Patient Re-evaluated:Patient Re-evaluated prior to induction Oxygen Delivery Method: Circle system utilized Preoxygenation: Pre-oxygenation with 100% oxygen Induction Type: IV induction Ventilation: Mask ventilation without difficulty LMA: LMA inserted LMA Size: 4.0 Tube type: Oral Number of attempts: 1 Placement Confirmation: positive ETCO2 and breath sounds checked- equal and bilateral Tube secured with: Tape Dental Injury: Teeth and Oropharynx as per pre-operative assessment

## 2023-07-19 ENCOUNTER — Encounter (HOSPITAL_COMMUNITY): Payer: Self-pay | Admitting: General Surgery

## 2023-07-19 ENCOUNTER — Telehealth: Payer: Self-pay

## 2023-07-19 NOTE — Telephone Encounter (Signed)
Called and told her that [facility] will take insurance and they have several locations. The office can send a referral to her preferred location.  She just had surgery and will call the office back when she feels better regarding referral. Given office phone #.

## 2023-07-19 NOTE — Progress Notes (Signed)
Wasted Fentanyl with Boykin Nearing, RN as witness.

## 2023-07-22 ENCOUNTER — Encounter: Payer: Self-pay | Admitting: Adult Health

## 2023-07-24 ENCOUNTER — Telehealth: Payer: Self-pay

## 2023-07-24 NOTE — Telephone Encounter (Signed)
Called pt. Per NP about doing signatera. Pt states that she will not be participating in the testing. Mardella Layman was notified.

## 2023-08-16 ENCOUNTER — Other Ambulatory Visit (HOSPITAL_BASED_OUTPATIENT_CLINIC_OR_DEPARTMENT_OTHER): Payer: Self-pay

## 2023-08-16 MED ORDER — INFLUENZA VIRUS VACC SPLIT PF (FLUZONE) 0.5 ML IM SUSY
0.5000 mL | PREFILLED_SYRINGE | Freq: Once | INTRAMUSCULAR | 0 refills | Status: AC
  Filled 2023-08-16: qty 0.5, 1d supply, fill #0

## 2023-08-31 ENCOUNTER — Ambulatory Visit
Admission: RE | Admit: 2023-08-31 | Discharge: 2023-08-31 | Disposition: A | Discharge status: Home / Self Care | Payer: 59 | Source: Ambulatory Visit | Attending: Adult Health | Admitting: Adult Health

## 2023-08-31 DIAGNOSIS — C50412 Malignant neoplasm of upper-outer quadrant of left female breast: Secondary | ICD-10-CM

## 2023-08-31 DIAGNOSIS — C50811 Malignant neoplasm of overlapping sites of right female breast: Secondary | ICD-10-CM

## 2023-09-01 ENCOUNTER — Ambulatory Visit: Payer: 59 | Attending: Cardiology | Admitting: Cardiology

## 2023-09-01 ENCOUNTER — Encounter: Payer: Self-pay | Admitting: Cardiology

## 2023-09-01 VITALS — BP 134/64 | HR 71 | Ht 61.0 in | Wt 214.0 lb

## 2023-09-01 DIAGNOSIS — Z6841 Body Mass Index (BMI) 40.0 and over, adult: Secondary | ICD-10-CM

## 2023-09-01 DIAGNOSIS — R002 Palpitations: Secondary | ICD-10-CM | POA: Diagnosis not present

## 2023-09-01 DIAGNOSIS — I10 Essential (primary) hypertension: Secondary | ICD-10-CM | POA: Diagnosis not present

## 2023-09-01 DIAGNOSIS — I340 Nonrheumatic mitral (valve) insufficiency: Secondary | ICD-10-CM

## 2023-09-01 DIAGNOSIS — E66813 Obesity, class 3: Secondary | ICD-10-CM

## 2023-09-01 DIAGNOSIS — E785 Hyperlipidemia, unspecified: Secondary | ICD-10-CM

## 2023-09-01 DIAGNOSIS — I341 Nonrheumatic mitral (valve) prolapse: Secondary | ICD-10-CM

## 2023-09-01 NOTE — Patient Instructions (Signed)
Medication Instructions:  No changes  *If you need a refill on your cardiac medications before your next appointment, please call your pharmacy*   Lab Work: None needed at this time    Testing/Procedures: ECHO to be scheduled in August 2025  Your physician has requested that you have an echocardiogram. Echocardiography is a painless test that uses sound waves to create images of your heart. It provides your doctor with information about the size and shape of your heart and how well your heart's chambers and valves are working. This procedure takes approximately one hour. There are no restrictions for this procedure. Please do NOT wear cologne, perfume, aftershave, or lotions (deodorant is allowed). Please arrive 15 minutes prior to your appointment time.  Please note: We ask at that you not bring children with you during ultrasound (echo/ vascular) testing. Due to room size and safety concerns, children are not allowed in the ultrasound rooms during exams. Our front office staff cannot provide observation of children in our lobby area while testing is being conducted. An adult accompanying a patient to their appointment will only be allowed in the ultrasound room at the discretion of the ultrasound technician under special circumstances. We apologize for any inconvenience.    Follow-Up: At [facility], you and your health needs are our priority.  As part of our continuing mission to provide you with exceptional heart care, we have created designated Provider Care Teams.  These Care Teams include your primary Cardiologist (physician) and Advanced Practice Providers (APPs -  Physician Assistants and Nurse Practitioners) who all work together to provide you with the care you need, when you need it.  We recommend signing up for the patient portal called "MyChart".  Sign up information is provided on this After Visit Summary.  MyChart is used to connect with patients for Virtual Visits  (Telemedicine).  Patients are able to view lab/test results, encounter notes, upcoming appointments, etc.  Non-urgent messages can be sent to your provider as well.   To learn more about what you can do with MyChart, go to [email or web address].    Your next appointment:   1 year(s)  Provider:   Bryan Lemma, MD

## 2023-09-01 NOTE — Progress Notes (Unsigned)
Cardiology Office Note:  .   Date:  09/02/2023  ID:  Katie Jacobson, DOB 06/05/1956, MRN [id number] PCP: [name], NP  [facility] Providers Cardiologist:  Bryan Lemma, MD     Chief Complaint  Patient presents with   Follow-up    Annual follow-up-at redo cancer surgery in the interim.   Cardiac Valve Problem    History of MVP.  Asymptomatic    Patient Profile: .     Katie Jacobson is a 67 y.o. female former smoker with a PMH notable for BrCA (s/p lumpectomy and chemo/XRT in September 2016) HTN, HLD and palpitations as well as MVP with moderate MR who presents here for annual follow-up at the request of [name], NP.    Katie Jacobson was last seen on August 23, 2022 follow-up after echocardiogram.  No major issues.  No sensation of irregular heartbeats or palpitations.  Just rare skipped beats.  Had not required additional dose of beta-blocker.Otherwise asymptomatic besides seasonal allergies occasional joint pains and stable right arm lymphedema. -> Echo reviewed (see below) with plan to recheck in September 2025  Subjective  Discussed the use of AI scribe software for clinical note transcription with the patient, who gave verbal consent to proceed.  Katie Jacobson returns here for annual follow-up.  She indicates that since my last visit she was diagnosed with recurrent breast cancer, for which she underwent a R Breast Mastectomy (& had recently had L Breast Lumpectomy). She reports some post-operative complications, including an infection at the port site and persistent drainage. She has completed a course of antibiotics for the infection. She also reports some body image concerns following the mastectomy.  We spent a lot of time talking about her postop concerns.  She had a lot of issues with the site of her lymph node dissections.  She feels that the mastectomy was devastating from a self-image standpoint.  She is looking forward to the  other get her undergarments with tilted padding.  Short after surgery she did have Chemo.  She is happy to now have the port out.  She discussed her hospitalization for infection and the need for antibiotics. .  She indicated that while she was in the hospital she did not get loaded with medications chlorthalidone enalapril.  She did not recall getting the Toprol.  Subsequently started.  Thankfully, despite having all these issues with her breast cancer and postop complications she has been doing fairly well from a cardiac standpoint.  Palpitations been pretty well-controlled on metoprolol as has her blood pressure.  She has had fatigue and deconditioning but is still "pushing through" dealing with the postop issues.  Not having any significant change to exertional dyspnea.  No chest pain or pressure.  Mild end of day swelling but no significant swelling besides the right arm. No syncope or near CV, TIA/amaurosis fugax or claudication.  No significant arrhythmias..   ROS:  Review of Systems - Negative except symptoms noted above    Objective   Studies Reviewed: Marland Kitchen   EKG Interpretation Date/Time:  Friday September 01 2023 13:28:43 EST Ventricular Rate:  71 PR Interval:  146 QRS Duration:  72 QT Interval:  388 QTC Calculation: 421 R Axis:   42  Text Interpretation: Sinus rhythm with Premature atrial complexes Possible Left atrial enlargement Low voltage QRS When compared with ECG of 29-Oct-2012 16:29, Premature atrial complexes are now Present QRS voltage has decreased Inverted T waves have replaced nonspecific T wave abnormality  in Inferior leads Confirmed by Bryan Lemma ([id number]) on 09/01/2023 1:41:16 PM    ECHO 05/31/2022: Normal LV size and function-EF 60 to 65%.  No RWMA. ??  Normal diastolic parameters but elevated LAP?  Normal RV with normal RVP and RAP.  Myxomatous mitral valve with mild MR and no MS.  Mild to moderate AI with aortic sclerosis but no stenosis.  Risk  Assessment/Calculations:             Physical Exam:   VS:  BP 134/64 (BP Location: Left Arm, Patient Position: Sitting, Cuff Size: Large)   Pulse 71   Ht 5\' 1"  (1.549 m)   Wt 214 lb (97.1 kg)   LMP 10/10/1996   SpO2 98%   BMI 40.43 kg/m    Wt Readings from Last 3 Encounters:  09/01/23 214 lb (97.1 kg)  07/18/23 211 lb (95.7 kg)  07/14/23 211 lb (95.7 kg)    GEN: Well nourished, well developed in no acute distress; obese NECK: No JVD; No carotid bruits CARDIAC: RRR, distant heart sounds.  Normal S1, S2; harsh 1/6 C-D SEM at RUSB-neck, 1/6 HSM at apex-back no rubs, gallops; occasional ectopy; mid-systolic click. RESPIRATORY:  Clear to auscultation without rales, wheezing or rhonchi ; nonlabored, good air movement. ABDOMEN: Soft, non-tender, non-distended EXTREMITIES:  No edema (other than mild trivial puffing in the ankles and minimal right arm); No deformity ; mildly reduced pedal pulses    ASSESSMENT AND PLAN: .    Problem List Items Addressed This Visit       Cardiology Problems   Essential hypertension (Chronic)    BP borderline but okay on current dose of enalapril 20 mg and chlorthalidone 25 mg along with Toprol 100 mg daily.      Relevant Orders   EKG 12-Lead (Completed)   Hyperlipidemia LDL goal <100 (Chronic)    Due to see her PCP in December.  Would hope that she would get lipids checked at that time.  Have not seen labs in a while.  Would like to treat you for an LDL less than 100.      Mitral regurgitation due to cusp prolapse - Primary (Chronic)    No notable MR on exam and mild to moderate on echo in the past.  This in the setting of MVP.  Plan follow-up echocardiogram September 2025      Relevant Orders   EKG 12-Lead (Completed)   ECHOCARDIOGRAM COMPLETE   Mitral valve prolapse (Chronic)    Stable MVP with mild MR-I do hear a soft click on exam. Due for follow-up echocardiogram in August/September 2025.  No signs of arrhythmias.  Plan: Check echo  August 2025. No need for SBE prophylaxis.      Relevant Orders   EKG 12-Lead (Completed)   ECHOCARDIOGRAM COMPLETE     Other   Class 3 severe obesity due to excess calories without serious comorbidity in adult [facility]) (Chronic)    Once she is able to get over her cancer issues, she can get back and try to do her exercise.  Has been reluctant to get out due to much.      Intermittent palpitations (Chronic)    Well-controlled on current dose of Toprol.          Follow-Up: Return in about 1 year (around 08/31/2024) for 1 Yr Follow-up.  Total time spent: 28 min spent with patient + 16 min spent charting = 44 min     Signed, Marykay Lex, MD, MS Onalee Hua  Herbie Baltimore, M.D., M.S. Interventional Cardiologist  [facility]  Pager # [phone] Phone # [phone] [address] [place], [place] [address]

## 2023-09-02 ENCOUNTER — Encounter: Payer: Self-pay | Admitting: Cardiology

## 2023-09-02 NOTE — Assessment & Plan Note (Signed)
Stable MVP with mild MR-I do hear a soft click on exam. Due for follow-up echocardiogram in August/September 2025.  No signs of arrhythmias.  Plan: Check echo August 2025. No need for SBE prophylaxis.

## 2023-09-02 NOTE — Assessment & Plan Note (Signed)
Once she is able to get over her cancer issues, she can get back and try to do her exercise.  Has been reluctant to get out due to much.

## 2023-09-02 NOTE — Assessment & Plan Note (Signed)
BP borderline but okay on current dose of enalapril 20 mg and chlorthalidone 25 mg along with Toprol 100 mg daily.

## 2023-09-02 NOTE — Assessment & Plan Note (Signed)
Due to see her PCP in December.  Would hope that she would get lipids checked at that time.  Have not seen labs in a while.  Would like to treat you for an LDL less than 100.

## 2023-09-02 NOTE — Assessment & Plan Note (Addendum)
>>  ASSESSMENT AND PLAN FOR MITRAL REGURGITATION DUE TO CUSP PROLAPSE WRITTEN ON 09/02/2023  4:57 PM BY Kilee Hedding W, MD  No notable MR on exam and mild to moderate on echo in the past.  This in the setting of MVP.  Plan follow-up echocardiogram September 2025   >>ASSESSMENT AND PLAN FOR MITRAL VALVE PROLAPSE WRITTEN ON 09/02/2023  4:56 PM BY Gissel Keilman W, MD  Stable MVP with mild MR-I do hear a soft click on exam. Due for follow-up echocardiogram in August/September 2025.  No signs of arrhythmias.  Plan: Check echo August 2025. No need for SBE prophylaxis.

## 2023-09-02 NOTE — Assessment & Plan Note (Signed)
Well-controlled on current dose of Toprol

## 2023-09-22 ENCOUNTER — Other Ambulatory Visit (HOSPITAL_BASED_OUTPATIENT_CLINIC_OR_DEPARTMENT_OTHER): Payer: Self-pay

## 2023-09-22 ENCOUNTER — Encounter: Payer: Self-pay | Admitting: Family

## 2023-09-22 ENCOUNTER — Ambulatory Visit (INDEPENDENT_AMBULATORY_CARE_PROVIDER_SITE_OTHER): Payer: HMO | Admitting: Family

## 2023-09-22 VITALS — BP 118/75 | HR 61 | Temp 99.0°F | Resp 16 | Ht 61.0 in | Wt 211.0 lb

## 2023-09-22 DIAGNOSIS — E785 Hyperlipidemia, unspecified: Secondary | ICD-10-CM | POA: Diagnosis not present

## 2023-09-22 DIAGNOSIS — I10 Essential (primary) hypertension: Secondary | ICD-10-CM | POA: Diagnosis not present

## 2023-09-22 DIAGNOSIS — C50911 Malignant neoplasm of unspecified site of right female breast: Secondary | ICD-10-CM | POA: Diagnosis not present

## 2023-09-22 DIAGNOSIS — C50912 Malignant neoplasm of unspecified site of left female breast: Secondary | ICD-10-CM | POA: Diagnosis not present

## 2023-09-22 MED ORDER — COMIRNATY 30 MCG/0.3ML IM SUSY
0.3000 mL | PREFILLED_SYRINGE | Freq: Once | INTRAMUSCULAR | 0 refills | Status: AC
  Filled 2023-09-22: qty 0.3, 1d supply, fill #0

## 2023-09-22 NOTE — Progress Notes (Signed)
Subjective:     Patient ID: Katie Jacobson, female    DOB: 04-28-1956, 67 y.o.   MRN: [id number]  Chief Complaint  Patient presents with   Hypertension    Here for follow up    Hypertension    Discussed the use of AI scribe software for clinical note transcription with the patient, who gave verbal consent to proceed.  History of Present Illness   The patient is a 67 year old individual with a history of hypertension and recent breast cancer. She underwent a right sided mastectomy and left breast lumpectomy on March 7th of the current year, followed by chemotherapy and radiation. The patient reports that the chemotherapy was particularly challenging, leading to a hospitalization after the first treatment. Despite these challenges, the patient reports that she was able to complete the recommended chemo and radiation. She has been able to continue with her daily activities, albeit at a slower pace.  The patient has been experiencing issues with her surgical site, including persistent drainage from two small holes in the incision. She also reports discomfort and dissatisfaction with the appearance of the surgical site. Despite these issues, the patient reports that she is generally doing well and is eager to move forward with her recovery. Her breast surgeon is aware of her surgical site issues.  She did have a skin infection at the site of her port right after removal that was addressed by her surgeon in what sounds like an in office I and D.   The patient's hypertension is managed with metoprolol XL 100mg  at night and enalapril 20mg . She reports that her blood pressure was slightly elevated at the beginning of the appointment, which she attributes to a muscle cramp. The patient is also interested in receiving the COVID-19 vaccine and discusses her insurance coverage and concerns about the cost of her medical care.     BP Readings from Last 3 Encounters:  09/22/23 118/75  09/01/23 134/64   07/18/23 (!) 143/72     Lab Results  Component Value Date   CHOL 209 (H) 08/10/2018   HDL 76 08/10/2018   LDLCALC 116 (H) 08/10/2018   TRIG 73 08/10/2018   CHOLHDL 2.8 08/10/2018      Health Maintenance Due  Topic Date Due   Medicare Annual Wellness (AWV)  Never done   Pneumonia Vaccine 51+ Years old (2 of 2 - PCV) 07/06/2013   Cervical Cancer Screening (HPV/Pap Cotest)  10/25/2019   COVID-19 Vaccine (6 - 2024-25 season) 06/11/2023    Past Medical History:  Diagnosis Date   Allergy    Arthritis    Breast cancer of upper-outer quadrant of right female breast (HCC) 11/14/2014   Treated with chemotherapy and radiation (diagnosed again in March 2024 in both breasts)   Heart murmur    History of chemotherapy    finished chemo 02/03/2015   History of kidney stones    History of seizures    as a child - unknown cause - states was never on anticonvulsants   Hypertension    states under control with meds., has been on med. x "years"   Mitral regurgitation    Mitral valve prolapse 1990   Echo 2023 with no MVP. Mild-mod aortic regurg and mild mitral regurg. 3 yr followup recommended.   Osteoporosis    Personal history of chemotherapy    Personal history of radiation therapy    Port-A-Cath in place 01/24/2023   Seasonal allergies     Past Surgical History:  Procedure Laterality Date   ABDOMINAL HYSTERECTOMY  1998   partial   APPENDECTOMY  1976   BREAST BIOPSY Right 09/30/2022   [place] RT BREAST BX W LOC DEV 1ST LESION IMG BX SPEC US GUIDE 09/30/2022 [facility]   BREAST BIOPSY Left 09/30/2022   [place] LT BREAST BX W LOC DEV 1ST LESION IMG BX SPEC US GUIDE 09/30/2022 [facility]   BREAST BIOPSY Right 09/30/2022   [place] RT BREAST BX W LOC DEV EA ADD LESION IMG BX SPEC US GUIDE 09/30/2022 [facility]   BREAST BIOPSY  12/14/2022   MM LT RADIOACTIVE SEED LOC MAMMO GUIDE 12/14/2022 [facility]   BREAST LUMPECTOMY Right 02/2015   BREAST LUMPECTOMY WITH  RADIOACTIVE SEED AND SENTINEL LYMPH NODE BIOPSY Left 12/15/2022   Procedure: LEFT BREAST LUMPECTOMY WITH RADIOACTIVE SEED AND AXILLARY SENTINEL LYMPH NODE BIOPSY;  Surgeon: Emelia Loron, MD;  Location: [facility] OR;  Service: General;  Laterality: Left;   COLONOSCOPY  09/30/2016   COMPLEX WOUND CLOSURE N/A 07/18/2023   Procedure: CLOSURE CHEST WALL WOUND X2;  Surgeon: Emelia Loron, MD;  Location: [facility] OR;  Service: General;  Laterality: N/A;   KNEE ARTHROSCOPY Left 03/26/2008   MASTECTOMY     MASTECTOMY W/ SENTINEL NODE BIOPSY Right 12/15/2022   Procedure: RIGHT MASTECTOMY WITH RIGHT AXILLARY SENTINEL LYMPH NODE BIOPSY;  Surgeon: Emelia Loron, MD;  Location: [facility] OR;  Service: General;  Laterality: Right;  180 MIN ROOM 9   PLANTAR'S WART EXCISION Left    x 2   PORT-A-CATH REMOVAL Left 04/06/2015   Procedure: REMOVAL PORT-A-CATH;  Surgeon: Emelia Loron, MD;  Location: [facility];  Service: General;  Laterality: Left;   PORT-A-CATH REMOVAL N/A 07/18/2023   Procedure: REMOVAL PORT-A-CATH;  Surgeon: Emelia Loron, MD;  Location: [facility] OR;  Service: General;  Laterality: N/A;   PORTACATH PLACEMENT N/A 11/27/2014   Procedure: INSERTION PORT-A-CATH;  Surgeon: Emelia Loron, MD;  Location: [facility];  Service: General;  Laterality: N/A;   PORTACATH PLACEMENT Right 12/15/2022   Procedure: INSERTION PORT-A-CATH;  Surgeon: Emelia Loron, MD;  Location: [facility] OR;  Service: General;  Laterality: Right;   RADIOACTIVE SEED GUIDED PARTIAL MASTECTOMY WITH AXILLARY SENTINEL LYMPH NODE BIOPSY Right 03/02/2015   Procedure: RADIOACTIVE SEED GUIDED RIGHT PARTIAL MASTECTOMY WITH RIGHT AXILLARY SENTINEL LYMPH NODE BIOPSY;  Surgeon: Emelia Loron, MD;  Location: [facility];  Service: General;  Laterality: Right;   SALIVARY STONE REMOVAL Right 1972   TONSILLECTOMY  1970s   TRANSTHORACIC ECHOCARDIOGRAM  01/16/2020   EF 60 to 65%.  GRII DD.  No R WMA.   Normal RV size and function.  Rheumatic mitral valve with moderate thickening-hockey-stick appearing.  Moderate MR with mild MS.  Moderate LA dilation.  (Recommend follow-up in 2 to 3 years)   TRANSTHORACIC ECHOCARDIOGRAM  05/31/2022   Normal LV size and function-EF 60 to 65%.  No RWMA. ??  Normal diastolic parameters but elevated LAP?  Normal RV with normal RVP and RAP.  Manage mitral valve with mild MR and no MS.  Mild to moderate AI with aortic sclerosis but no stenosis.   TUBAL LIGATION  1996    Family History  Problem Relation Age of Onset   Heart disease Mother    Stroke Mother    Hypertension Mother    Diabetes Mother    Diabetes Sister    Kidney disease Brother    Mental retardation Brother    Diabetes Sister    Heart attack  Father    Breast cancer Cousin        deceased 37   Colon polyps Daughter    Colon cancer Neg Hx    Esophageal cancer Neg Hx    Rectal cancer Neg Hx    Stomach cancer Neg Hx     Social History   Socioeconomic History   Marital status: Married    Spouse name: Not on file   Number of children: 1   Years of education: Not on file   Highest education level: Not on file  Occupational History   Not on file  Tobacco Use   Smoking status: Former    Current packs/day: 0.50    Average packs/day: 0.5 packs/day for 43.0 years (21.5 ttl pk-yrs)    Types: Cigarettes   Smokeless tobacco: Never   Tobacco comments:    Patient quit 11/2022  Vaping Use   Vaping status: Never Used  Substance and Sexual Activity   Alcohol use: Yes    Alcohol/week: 5.0 standard drinks of alcohol    Types: 5 Standard drinks or equivalent per week    Comment: occ    Drug use: No   Sexual activity: Yes    Partners: Male  Other Topics Concern   Not on file  Social History Narrative   Married with 2 daughters- born 71 and 48 ([name] died in MVA 10/04/2016)   Previously worked -- Therapist, 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  Has been doing part time work with  [employer] -- had 6 active clients, but the most important one is her Daughter.   Regular exercise:  No   Caffeine Use: none; Current 1ppd smoker x > 39 yrs; EtOH ~4 oz/week   Social Drivers of Corporate investment banker Strain: Not on file  Food Insecurity: No Food Insecurity (04/11/2023)   Hunger Vital Sign    Worried About Running Out of Food in the Last Year: Never true    Ran Out of Food in the Last Year: Never true  Transportation Needs: No Transportation Needs (04/11/2023)   PRAPARE - Administrator, Civil Service (Medical): No    Lack of Transportation (Non-Medical): No  Physical Activity: Not on file  Stress: Not on file  Social Connections: Unknown (02/22/2022)   [facility]   Social Network    Social Network: Not on file  Intimate Partner Violence: Not At Risk (04/11/2023)   Humiliation, Afraid, Rape, and Kick questionnaire    Fear of Current or Ex-Partner: No    Emotionally Abused: No    Physically Abused: No    Sexually Abused: No    Outpatient Medications Prior to Visit  Medication Sig Dispense Refill   albuterol (VENTOLIN HFA) 108 (90 Base) MCG/ACT inhaler Inhale 1-2 puffs into the lungs every 6 (six) hours as needed for wheezing or shortness of breath. (Patient not taking: Reported on 09/01/2023) 1 each 0   aspirin 81 MG tablet Take 1 tablet (81 mg total) by mouth daily. 30 tablet 0   chlorthalidone (HYGROTON) 25 MG tablet TAKE 1 TABLET(25 MG) BY MOUTH EVERY OTHER DAY (Patient taking differently: Take 25 mg by mouth every 10/05/2023, Wednesday, and Friday.) 45 tablet 1   enalapril (VASOTEC) 20 MG tablet TAKE 1 TABLET BY MOUTH EVERYDAY AT BEDTIME 90 tablet 1   gabapentin (NEURONTIN) 100 MG capsule Take 1 capsule by mouth at bedtime.     metoprolol succinate (TOPROL-XL) 100  MG 24 hr tablet Take 1 tablet (100 mg total) by mouth daily. Take with or immediately following a meal. (Patient taking differently: Take 100 mg by mouth at  bedtime. Take with or immediately following a meal.) 90 tablet 3   fluticasone (FLONASE) 50 MCG/ACT nasal spray Place 2 sprays into both nostrils as needed. (Patient not taking: Reported on 09/01/2023) 16 g 0   methocarbamol (ROBAXIN) 500 MG tablet Take 500 mg by mouth as needed. (Patient not taking: Reported on 09/01/2023)     No facility-administered medications prior to visit.    Allergies  Allergen Reactions   Sulfa Antibiotics Other (See Comments)    UNKNOWN    ROS    See HPI Objective:    Physical Exam Constitutional:      General: She is not in acute distress.    Appearance: Normal appearance. She is well-developed.  HENT:     Head: Normocephalic and atraumatic.     Right Ear: External ear normal.     Left Ear: External ear normal.  Eyes:     General: No scleral icterus. Neck:     Thyroid: No thyromegaly.  Cardiovascular:     Rate and Rhythm: Normal rate and regular rhythm.     Heart sounds: Normal heart sounds. No murmur heard. Pulmonary:     Effort: Pulmonary effort is normal. No respiratory distress.     Breath sounds: Normal breath sounds. No wheezing.  Musculoskeletal:     Cervical back: Neck supple.  Skin:    General: Skin is warm and dry.  Neurological:     Mental Status: She is alert and oriented to person, place, and time.  Psychiatric:        Mood and Affect: Mood normal.        Behavior: Behavior normal.        Thought Content: Thought content normal.        Judgment: Judgment normal.      BP 118/75   Pulse 61   Temp 99 F (37.2 C) (Oral)   Resp 16   Ht 5\' 1"  (1.549 m)   Wt 211 lb (95.7 kg)   LMP 10/10/1996   SpO2 100%   BMI 39.87 kg/m  Wt Readings from Last 3 Encounters:  09/22/23 211 lb (95.7 kg)  09/01/23 214 lb (97.1 kg)  07/18/23 211 lb (95.7 kg)       Assessment & Plan:   Problem List Items Addressed This Visit       Unprioritized   Hyperlipidemia LDL goal <100 - Primary (Chronic)   Lab Results  Component Value Date    CHOL 209 (H) 08/10/2018   HDL 76 08/10/2018   LDLCALC 116 (H) 08/10/2018   TRIG 73 08/10/2018   CHOLHDL 2.8 08/10/2018   Will update lipid panel today.       Relevant Orders   Comp Met (CMET)   Lipid panel   Essential hypertension (Chronic)   Initial bp was elevated. Repeat BP better. Continue toprol xl and enalapril 20mg .  Repeat bp in 3 months.       Bilateral malignant neoplasm of breast in female [facility])   Pt completed treatment. She continues to follow with oncology (Dr. Philipp Ovens) and Collene Gobble NP with the survivorship program.        I have discontinued Carles Collet. Alire's fluticasone and methocarbamol. I am also having her maintain her aspirin, albuterol, metoprolol succinate, enalapril, chlorthalidone, and gabapentin.  No orders of the defined types were  placed in this encounter.

## 2023-09-22 NOTE — Assessment & Plan Note (Signed)
Lab Results  Component Value Date   CHOL 209 (H) 08/10/2018   HDL 76 08/10/2018   LDLCALC 116 (H) 08/10/2018   TRIG 73 08/10/2018   CHOLHDL 2.8 08/10/2018   Will update lipid panel today.

## 2023-09-22 NOTE — Assessment & Plan Note (Signed)
Initial bp was elevated. Repeat BP better. Continue toprol xl and enalapril 20mg .  Repeat bp in 3 months.

## 2023-09-22 NOTE — Patient Instructions (Signed)
VISIT SUMMARY:  Today, we reviewed your current health status, including your hypertension, recent breast cancer treatment, and general health maintenance. We discussed your concerns about the surgical site and your interest in receiving the COVID-19 vaccine. We also reviewed your medications and planned for follow-up tests and vaccinations.  YOUR PLAN:  -HYPERTENSION: Hypertension means high blood pressure. Your blood pressure was slightly elevated today, possibly due to a muscle cramp. Continue taking Metoprolol Succinate 100mg  daily and Enalapril 20mg  daily. We will recheck your blood pressure in 3 months.  -BREAST CANCER: You are recovering from a mastectomy and subsequent chemotherapy and radiation. You are experiencing drainage from the surgical site and are unhappy with its appearance. Continue your follow-up with oncology to monitor your recovery and address these concerns.  -HYPERLIPIDEMIA: Hyperlipidemia means high cholesterol levels in the blood. Your last cholesterol check was high, and we will check your lipid panel today to discuss possible treatment options at your next visit.  -VACCINATIONS: You are due for the Pneumovax and COVID-19 booster vaccines. Please get the COVID-19 booster today in the pharmacy downstairs and schedule the Pneumovax for next week.  -GENERAL HEALTH MAINTENANCE: We will check your kidney function today and consider screening for HIV and Hepatitis C as part of your routine health maintenance.  INSTRUCTIONS:  Please continue your current medications for hypertension and follow up with oncology for your breast cancer recovery. We will recheck your blood pressure in 3 months. Today, we will check your lipid panel and kidney function, and administer the COVID-19 booster. Please schedule the Pneumovax vaccine for next week. We will discuss the results of your lipid panel and any necessary treatments at your next visit.

## 2023-09-22 NOTE — Assessment & Plan Note (Addendum)
Pt completed treatment. She continues to follow with oncology (Dr. Philipp Ovens) and Collene Gobble NP with the survivorship program.

## 2023-09-23 ENCOUNTER — Encounter: Payer: Self-pay | Admitting: Family

## 2023-09-23 LAB — COMPREHENSIVE METABOLIC PANEL
AG Ratio: 1.3 (calc) (ref 1.0–2.5)
ALT: 13 U/L (ref 6–29)
AST: 14 U/L (ref 10–35)
Albumin: 3.8 g/dL (ref 3.6–5.1)
Alkaline phosphatase (APISO): 83 U/L (ref 37–153)
BUN: 13 mg/dL (ref 7–25)
CO2: 26 mmol/L (ref 20–32)
Calcium: 9.6 mg/dL (ref 8.6–10.4)
Chloride: 104 mmol/L (ref 98–110)
Creat: 0.85 mg/dL (ref 0.50–1.05)
Globulin: 3 g/dL (ref 1.9–3.7)
Glucose, Bld: 81 mg/dL (ref 65–99)
Potassium: 4.5 mmol/L (ref 3.5–5.3)
Sodium: 139 mmol/L (ref 135–146)
Total Bilirubin: 0.7 mg/dL (ref 0.2–1.2)
Total Protein: 6.8 g/dL (ref 6.1–8.1)

## 2023-09-23 LAB — LIPID PANEL
Cholesterol: 199 mg/dL (ref <200)
HDL: 75 mg/dL (ref >=50)
LDL Cholesterol (Calc): 109 mg/dL — ABNORMAL HIGH
Non-HDL Cholesterol (Calc): 124 mg/dL (ref <130)
Total CHOL/HDL Ratio: 2.7 (calc) (ref <5.0)
Triglycerides: 64 mg/dL (ref <150)

## 2023-10-24 ENCOUNTER — Other Ambulatory Visit (HOSPITAL_BASED_OUTPATIENT_CLINIC_OR_DEPARTMENT_OTHER): Payer: Self-pay

## 2023-10-24 ENCOUNTER — Telehealth: Payer: Self-pay

## 2023-10-24 MED ORDER — PNEUMOCOCCAL 20-VAL CONJ VACC 0.5 ML IM SUSY
0.5000 mL | PREFILLED_SYRINGE | Freq: Once | INTRAMUSCULAR | 0 refills | Status: AC
  Filled 2023-10-24: qty 0.5, 1d supply, fill #0

## 2023-10-24 NOTE — Telephone Encounter (Signed)
 It looks like she already had it today.  She should be all set on her pneumonia shots.

## 2023-10-24 NOTE — Telephone Encounter (Signed)
 Copied from CRM [id number]. Topic: Clinical - Request for Lab/Test Order >> Oct 24, 2023  1:27 PM Deaijah H wrote: Reason for CRM: Patient called in wanting to know if pneumonia shot was offered at clinic or if she could just go downstairs, if offered patient would like to know and have that ordered and scheduled by provider / please call [phone]

## 2023-11-19 ENCOUNTER — Other Ambulatory Visit: Payer: Self-pay | Admitting: Family

## 2023-11-21 DIAGNOSIS — C50411 Malignant neoplasm of upper-outer quadrant of right female breast: Secondary | ICD-10-CM | POA: Diagnosis not present

## 2023-11-21 DIAGNOSIS — Z171 Estrogen receptor negative status [ER-]: Secondary | ICD-10-CM | POA: Diagnosis not present

## 2023-12-03 NOTE — Therapy (Signed)
 OUTPATIENT PHYSICAL THERAPY  UPPER EXTREMITY ONCOLOGY EVALUATION  Patient Name: Katie Jacobson MRN: [id number] DOB:04-22-1956, 68 y.o., female Today's Date: 12/06/2023  END OF SESSION:  PT End of Session - 12/06/23 1912     Visit Number 1    Number of Visits 11    Date for PT Re-Evaluation 01/10/24    Authorization Type none    PT Start Time 1300    PT Stop Time 1355    PT Time Calculation (min) 55 min    Activity Tolerance Patient tolerated treatment well    Behavior During Therapy [facility] for tasks assessed/performed             Past Medical History:  Diagnosis Date   Allergy    Arthritis    Breast cancer of upper-outer quadrant of right female breast (HCC) 11/14/2014   Treated with chemotherapy and radiation (diagnosed again in March 2024 in both breasts)   Heart murmur    History of chemotherapy    finished chemo 02/03/2015   History of kidney stones    History of seizures    as a child - unknown cause - states was never on anticonvulsants   Hypertension    states under control with meds., has been on med. x "years"   Mitral regurgitation    Mitral valve prolapse 1990   Echo 2023 with no MVP. Mild-mod aortic regurg and mild mitral regurg. 3 yr followup recommended.   Osteoporosis    Personal history of chemotherapy    Personal history of radiation therapy    Port-A-Cath in place 01/24/2023   Seasonal allergies    Past Surgical History:  Procedure Laterality Date   ABDOMINAL HYSTERECTOMY  1998   partial   APPENDECTOMY  1976   BREAST BIOPSY Right 09/30/2022   [place] RT BREAST BX W LOC DEV 1ST LESION IMG BX SPEC US GUIDE 09/30/2022 [facility]   BREAST BIOPSY Left 09/30/2022   [place] LT BREAST BX W LOC DEV 1ST LESION IMG BX SPEC US GUIDE 09/30/2022 [facility]   BREAST BIOPSY Right 09/30/2022   [place] RT BREAST BX W LOC DEV EA ADD LESION IMG BX SPEC US GUIDE 09/30/2022 [facility]   BREAST BIOPSY  12/14/2022   MM LT RADIOACTIVE SEED LOC MAMMO GUIDE  12/14/2022 [facility]   BREAST LUMPECTOMY Right 02/2015   BREAST LUMPECTOMY WITH RADIOACTIVE SEED AND SENTINEL LYMPH NODE BIOPSY Left 12/15/2022   Procedure: LEFT BREAST LUMPECTOMY WITH RADIOACTIVE SEED AND AXILLARY SENTINEL LYMPH NODE BIOPSY;  Surgeon: Emelia Loron, MD;  Location: [facility] OR;  Service: General;  Laterality: Left;   COLONOSCOPY  09/30/2016   COMPLEX WOUND CLOSURE N/A 07/18/2023   Procedure: CLOSURE CHEST WALL WOUND X2;  Surgeon: Emelia Loron, MD;  Location: [facility] OR;  Service: General;  Laterality: N/A;   KNEE ARTHROSCOPY Left 03/26/2008   MASTECTOMY     MASTECTOMY W/ SENTINEL NODE BIOPSY Right 12/15/2022   Procedure: RIGHT MASTECTOMY WITH RIGHT AXILLARY SENTINEL LYMPH NODE BIOPSY;  Surgeon: Emelia Loron, MD;  Location: [facility] OR;  Service: General;  Laterality: Right;  180 MIN ROOM 9   PLANTAR'S WART EXCISION Left    x 2   PORT-A-CATH REMOVAL Left 04/06/2015   Procedure: REMOVAL PORT-A-CATH;  Surgeon: Emelia Loron, MD;  Location:  SURGERY CENTER;  Service: General;  Laterality: Left;   PORT-A-CATH REMOVAL N/A 07/18/2023   Procedure: REMOVAL PORT-A-CATH;  Surgeon: Emelia Loron, MD;  Location: [facility] OR;  Service: General;  Laterality: N/A;  PORTACATH PLACEMENT N/A 11/27/2014   Procedure: INSERTION PORT-A-CATH;  Surgeon: Emelia Loron, MD;  Location: [facility];  Service: General;  Laterality: N/A;   PORTACATH PLACEMENT Right 12/15/2022   Procedure: INSERTION PORT-A-CATH;  Surgeon: Emelia Loron, MD;  Location: [facility] OR;  Service: General;  Laterality: Right;   RADIOACTIVE SEED GUIDED PARTIAL MASTECTOMY WITH AXILLARY SENTINEL LYMPH NODE BIOPSY Right 03/02/2015   Procedure: RADIOACTIVE SEED GUIDED RIGHT PARTIAL MASTECTOMY WITH RIGHT AXILLARY SENTINEL LYMPH NODE BIOPSY;  Surgeon: Emelia Loron, MD;  Location: [facility];  Service: General;  Laterality: Right;   SALIVARY STONE REMOVAL Right 1972   TONSILLECTOMY   1970s   TRANSTHORACIC ECHOCARDIOGRAM  01/16/2020   EF 60 to 65%.  GRII DD.  No R WMA.  Normal RV size and function.  Rheumatic mitral valve with moderate thickening-hockey-stick appearing.  Moderate MR with mild MS.  Moderate LA dilation.  (Recommend follow-up in 2 to 3 years)   TRANSTHORACIC ECHOCARDIOGRAM  05/31/2022   Normal LV size and function-EF 60 to 65%.  No RWMA. ??  Normal diastolic parameters but elevated LAP?  Normal RV with normal RVP and RAP.  Manage mitral valve with mild MR and no MS.  Mild to moderate AI with aortic sclerosis but no stenosis.   TUBAL LIGATION  1996   Patient Active Problem List   Diagnosis Date Noted   Breast cancer (HCC) 12/15/2022   Bilateral malignant neoplasm of breast in female [facility]) 11/02/2022   Allergic reaction to chemical substance 01/30/2022   Preventative health care 01/28/2022   Cervical radiculopathy 04/20/2021   Primary osteoarthritis of left knee 03/09/2021   Vitamin D deficiency 07/29/2019   Smoking greater than 40 pack years 07/27/2019   Class 3 severe obesity due to excess calories without serious comorbidity in adult [facility]) 07/26/2019   Genetic testing 01/25/2016   Microscopic hematuria 09/06/2015   Vaginal dryness 12/09/2014   Family history of breast cancer    Malignant neoplasm of upper-outer quadrant of right breast in female, estrogen receptor negative (HCC) 11/14/2014   Intermittent palpitations    Obesity (BMI 35.0-39.9 without comorbidity)    Hyperlipidemia LDL goal <100 07/07/2011   History of partial seizures 04/01/2011   Asthma 04/01/2011   Hot flashes 04/01/2011   Tobacco abuse 04/01/2011   Essential hypertension 04/01/2011   Mitral regurgitation due to cusp prolapse 04/01/2011    PCP: [name], NP  REFERRING PROVIDER: Emelia Loron, MD  REFERRING DIAG: Rt breast cancer with mastectomy   THERAPY DIAG:  Malignant neoplasm of upper-outer quadrant of right breast in female, estrogen receptor negative  (HCC)  Stiffness of left shoulder, not elsewhere classified  Stiffness of right shoulder, not elsewhere classified  Abnormal posture  Aftercare following surgery for neoplasm  ONSET DATE: 12/15/22  Rationale for Evaluation and Treatment: Rehabilitation  SUBJECTIVE:  SUBJECTIVE STATEMENT: Dr. Dwain Sarna thinks I need to get a little bit more motion in the arm.  Even after surgery last year I still get drainage out of 2 spots on the chest.  I am fearful of infection.  I have never had infection in the chest only in the port.  The port side also hurts.  I feel like I never had a problem with the range up until a month ago and then it has gotten worse. Its getting hard to reach up into the cabinet - I use a spatula to help reach because I am short but this isn't enough anymore.  Both sides are feeling tight but the right is the worst.  I don't feel swollen in the arm or in the chest.    PERTINENT HISTORY: Pt. Underwent a Right lumpectomy for Gr. 1 IDC on 03/02/2015 with 0/4 LN's. She had neoadjuvant chemo and radiation. In November of 2023 she noted breast asymmetry and patient was diagnosed on 10/07/2022 with right and left breast grade 2 IDC. It measures .8 cm on the left and a It is Triple Negative with a Ki67 of 95% . Right breast mass is 1.7 cm and 1 cm and Ki 67 of 50%. She had a right mastectomy and left lumpectomy with SLNB on 12/15/2022. Completed chemotherapy and then bil chest wall radiation ending 06/09/23. port removal 07/18/23 with need for re-excision and packing with infection. 2 poor healing wounds on mastectomy incision chronically - has a referral to hyperbaric O2.   PAIN:  Are you having pain? Yes - only when I move a certain way or lay on the Right side NPRS scale: 7/10 Pain location: Rt chest  Pain  orientation: Right  PAIN TYPE: aching and tight Pain description: constant  Aggravating factors: reaching  Relieving factors: rest  PRECAUTIONS: bil lymphedema risk   RED FLAGS: None   WEIGHT BEARING RESTRICTIONS: No  FALLS:  Has patient fallen in last 6 months? No  LIVING ENVIRONMENT: Lives with: lives with their family and lives with their spouse  OCCUPATION: not working   LEISURE: nothing really.  I would like this to heal so I can exercise more.    HAND DOMINANCE: can use both   PRIOR LEVEL OF FUNCTION: Independent  PATIENT GOALS: improve motion on both arms    OBJECTIVE: Note: Objective measures were completed at Evaluation unless otherwise noted.  COGNITION: Overall cognitive status: Within functional limits for tasks assessed   PALPATION: +2 ttp Rt UT with more tightness here compared to the Lt side  OBSERVATIONS / OTHER ASSESSMENTS: wearing compression.  Incision healed except for 2 small erasure sized spots that pt has covered with a bandage.  The lateral incision almost folds over on itself.  Did not uncover bandage today.    POSTURE: guarded   UPPER EXTREMITY AROM/PROM:       A/PROM RIGHT   eval 11/28/2022 12/04/23  Shoulder extension 63 45 - pain at the Rt pouch on the chest  Shoulder flexion 140 100 - pulling in the Rt trunk/ 110 pn  Shoulder abduction 135 110 - it made the whole arm hurt / 145 pn  Shoulder internal rotation 65   Shoulder external rotation 84 70 / 65                          (Blank rows = not tested)   A/PROM LEFT   Eval 11/28/22 12/04/23  Shoulder extension 63 60  Shoulder flexion 165 140 / 170  Shoulder abduction 162 140 / 170  Shoulder internal rotation 73   Shoulder external rotation 87 80 / 85                          (Blank rows = not tested)   UPPER EXTREMITY STRENGTH:    LYMPHEDEMA ASSESSMENTS:    LANDMARK RIGHT   baseline  10 cm proximal to olecranon process 38.2  Olecranon process 28.5  10 cm proximal to  ulnar styloid process 26.4  Just proximal to ulnar styloid process 18.05  Across hand at thumb web space 20.1  At base of 2nd digit 6.0  (Blank rows = not tested)   LANDMARK LEFT   baseline  10 cm proximal to olecranon process 38.4  Olecranon process 27.9  10 cm proximal to ulnar styloid process 25.1  Just proximal to ulnar styloid process 17.4  Across hand at thumb web space 19.8  At base of 2nd digit 5.6  (Blank rows = not tested)  QUICK DASH SURVEY: 43% from 34%                                                                                                                            TREATMENT DATE:  12/04/23 Eval performed Education on HEP to start at home including UT stretch, scapular retractions, and posterior shoulder rolls.      PATIENT EDUCATION:  Education details: per today's note Person educated: Patient Education method: Programmer, multimedia, Facilities manager, and Handouts Education comprehension: verbalized understanding and returned demonstration  HOME EXERCISE PROGRAM: Code not copied: scapular retractions, posterior shoulder rolls, Rt UT stretch    ASSESSMENT:  CLINICAL IMPRESSION: Patient is a 68 y.o. female who was seen today for physical therapy evaluation and treatment for her bil radiation fibrosis Rt>Lt after Rt mastectomy and Lt lupectomy almost 1 year ago.  She was doing well up until a few months ago when she realized she was losing motion and getting stiff.  Her Rt UE has lost quite a bit of motion compared to her prior visit here.  The shoulder does not seem frozen but more weak and stiff.  Pt also has tension and pain in the Rt UT and upper quadrant.  She is limited by 2 small incisions that have not healed since surgery last year.  Pt will benefit from PT to improve reach and arm motion.    OBJECTIVE IMPAIRMENTS: decreased knowledge of condition, decreased knowledge of use of DME, decreased ROM, and decreased strength.   ACTIVITY LIMITATIONS: carrying,  lifting, and reach over head  PARTICIPATION LIMITATIONS: cleaning and community activity  PERSONAL FACTORS: Age, Fitness, Time since onset of injury/illness/exacerbation, and 1-2 comorbidities: radiation, SLNB  are also affecting patient's functional outcome.   REHAB POTENTIAL: Excellent  CLINICAL DECISION MAKING: Stable/uncomplicated  EVALUATION COMPLEXITY: Low  GOALS: Goals reviewed with patient? Yes  SHORT TERM GOALS=LTGs: Target date: 01/10/24  Pt  will improve Rt shoulder AROM into flexion and abduction equal to the baseline measurements  Baseline: Goal status: INITIAL  2.  Pt will report being able to reach into her cabinet again with the help of her spatula which is normal for her Baseline:  Goal status: INITIAL  3.  Pt will improve Lt shoulder flexion and abduction to baseline measurements  Baseline:  Goal status: INITIAL  4.  Pt will be ind with final HEP Baseline:  Goal status: INITIAL  5.  Pt will be educated on ABC video to watch for lymphedema education review Baseline:  Goal status: INITIAL   PLAN:  PT FREQUENCY: 1-2x/week  PT DURATION: 5 weeks   PLANNED INTERVENTIONS: 97164- PT Re-evaluation, 97110-Therapeutic exercises, 97530- Therapeutic activity, 97112- Neuromuscular re-education, 97535- Self Care, 13086- Manual therapy, Patient/Family education, Therapeutic exercises, Therapeutic activity, Neuromuscular re-education, Gait training, and Self Care  PLAN FOR NEXT SESSION: check incision open spots if uncovered before stretching, then bil shoulder AAROM/PROM, include Rt UT work   Idamae Lusher, PT 12/06/2023, 7:13 PM

## 2023-12-04 ENCOUNTER — Ambulatory Visit: Payer: HMO | Attending: General Surgery | Admitting: Rehabilitation

## 2023-12-04 ENCOUNTER — Other Ambulatory Visit: Payer: Self-pay

## 2023-12-04 ENCOUNTER — Encounter: Payer: Self-pay | Admitting: Rehabilitation

## 2023-12-04 DIAGNOSIS — C50411 Malignant neoplasm of upper-outer quadrant of right female breast: Secondary | ICD-10-CM | POA: Insufficient documentation

## 2023-12-04 DIAGNOSIS — M25612 Stiffness of left shoulder, not elsewhere classified: Secondary | ICD-10-CM | POA: Diagnosis not present

## 2023-12-04 DIAGNOSIS — M25611 Stiffness of right shoulder, not elsewhere classified: Secondary | ICD-10-CM

## 2023-12-04 DIAGNOSIS — Z483 Aftercare following surgery for neoplasm: Secondary | ICD-10-CM | POA: Diagnosis not present

## 2023-12-04 DIAGNOSIS — Z171 Estrogen receptor negative status [ER-]: Secondary | ICD-10-CM

## 2023-12-04 DIAGNOSIS — R293 Abnormal posture: Secondary | ICD-10-CM

## 2023-12-13 ENCOUNTER — Ambulatory Visit: Payer: HMO | Attending: General Surgery

## 2023-12-13 DIAGNOSIS — Z483 Aftercare following surgery for neoplasm: Secondary | ICD-10-CM | POA: Insufficient documentation

## 2023-12-13 DIAGNOSIS — M25611 Stiffness of right shoulder, not elsewhere classified: Secondary | ICD-10-CM | POA: Diagnosis not present

## 2023-12-13 DIAGNOSIS — Z171 Estrogen receptor negative status [ER-]: Secondary | ICD-10-CM | POA: Insufficient documentation

## 2023-12-13 DIAGNOSIS — M25612 Stiffness of left shoulder, not elsewhere classified: Secondary | ICD-10-CM | POA: Diagnosis not present

## 2023-12-13 DIAGNOSIS — C50411 Malignant neoplasm of upper-outer quadrant of right female breast: Secondary | ICD-10-CM | POA: Diagnosis not present

## 2023-12-13 DIAGNOSIS — R293 Abnormal posture: Secondary | ICD-10-CM | POA: Diagnosis not present

## 2023-12-13 NOTE — Therapy (Signed)
 OUTPATIENT PHYSICAL THERAPY  UPPER EXTREMITY ONCOLOGY TREATMENT  Patient Name: Katie Jacobson MRN: [id number] DOB:1955-12-07, 68 y.o., female Today's Date: 12/13/2023  END OF SESSION:  PT End of Session - 12/13/23 1612     Visit Number 2    Number of Visits 11    Date for PT Re-Evaluation 01/10/24    PT Start Time 1607    PT Stop Time 1704    PT Time Calculation (min) 57 min    Activity Tolerance Patient tolerated treatment well    Behavior During Therapy [facility] for tasks assessed/performed             Past Medical History:  Diagnosis Date   Allergy    Arthritis    Breast cancer of upper-outer quadrant of right female breast (HCC) 11/14/2014   Treated with chemotherapy and radiation (diagnosed again in March 2024 in both breasts)   Heart murmur    History of chemotherapy    finished chemo 02/03/2015   History of kidney stones    History of seizures    as a child - unknown cause - states was never on anticonvulsants   Hypertension    states under control with meds., has been on med. x "years"   Mitral regurgitation    Mitral valve prolapse 1990   Echo 2023 with no MVP. Mild-mod aortic regurg and mild mitral regurg. 3 yr followup recommended.   Osteoporosis    Personal history of chemotherapy    Personal history of radiation therapy    Port-A-Cath in place 01/24/2023   Seasonal allergies    Past Surgical History:  Procedure Laterality Date   ABDOMINAL HYSTERECTOMY  1998   partial   APPENDECTOMY  1976   BREAST BIOPSY Right 09/30/2022   [place] RT BREAST BX W LOC DEV 1ST LESION IMG BX SPEC US GUIDE 09/30/2022 [facility]   BREAST BIOPSY Left 09/30/2022   [place] LT BREAST BX W LOC DEV 1ST LESION IMG BX SPEC US GUIDE 09/30/2022 [facility]   BREAST BIOPSY Right 09/30/2022   [place] RT BREAST BX W LOC DEV EA ADD LESION IMG BX SPEC US GUIDE 09/30/2022 [facility]   BREAST BIOPSY  12/14/2022   MM LT RADIOACTIVE SEED LOC MAMMO GUIDE 12/14/2022 [facility]    BREAST LUMPECTOMY Right 02/2015   BREAST LUMPECTOMY WITH RADIOACTIVE SEED AND SENTINEL LYMPH NODE BIOPSY Left 12/15/2022   Procedure: LEFT BREAST LUMPECTOMY WITH RADIOACTIVE SEED AND AXILLARY SENTINEL LYMPH NODE BIOPSY;  Surgeon: Emelia Loron, MD;  Location: [facility] OR;  Service: General;  Laterality: Left;   COLONOSCOPY  09/30/2016   COMPLEX WOUND CLOSURE N/A 07/18/2023   Procedure: CLOSURE CHEST WALL WOUND X2;  Surgeon: Emelia Loron, MD;  Location[facility] OR;  Service: General;  Laterality: N/A;   KNEE ARTHROSCOPY Left 03/26/2008   MASTECTOMY     MASTECTOMY W/ SENTINEL NODE BIOPSY Right 12/15/2022   Procedure: RIGHT MASTECTOMY WITH RIGHT AXILLARY SENTINEL LYMPH NODE BIOPSY;  Surgeon: Emelia Loron, MD;  Location: [facility] OR;  Service: General;  Laterality: Right;  180 MIN ROOM 9   PLANTAR'S WART EXCISION Left    x 2   PORT-A-CATH REMOVAL Left 04/06/2015   Procedure: REMOVAL PORT-A-CATH;  Surgeon: Emelia Loron, MD;  Location: [facility];  Service: General;  Laterality: Left;   PORT-A-CATH REMOVAL N/A 07/18/2023   Procedure: REMOVAL PORT-A-CATH;  Surgeon: Emelia Loron, MD;  Location: [facility] OR;  Service: General;  Laterality: N/A;   PORTACATH PLACEMENT N/A 11/27/2014  Procedure: INSERTION PORT-A-CATH;  Surgeon: Emelia Loron, MD;  Location: [facility];  Service: General;  Laterality: N/A;   PORTACATH PLACEMENT Right 12/15/2022   Procedure: INSERTION PORT-A-CATH;  Surgeon: Emelia Loron, MD;  Location: [facility] OR;  Service: General;  Laterality: Right;   RADIOACTIVE SEED GUIDED PARTIAL MASTECTOMY WITH AXILLARY SENTINEL LYMPH NODE BIOPSY Right 03/02/2015   Procedure: RADIOACTIVE SEED GUIDED RIGHT PARTIAL MASTECTOMY WITH RIGHT AXILLARY SENTINEL LYMPH NODE BIOPSY;  Surgeon: Emelia Loron, MD;  Location: [facility];  Service: General;  Laterality: Right;   SALIVARY STONE REMOVAL Right 1972   TONSILLECTOMY  1970s   TRANSTHORACIC  ECHOCARDIOGRAM  01/16/2020   EF 60 to 65%.  GRII DD.  No R WMA.  Normal RV size and function.  Rheumatic mitral valve with moderate thickening-hockey-stick appearing.  Moderate MR with mild MS.  Moderate LA dilation.  (Recommend follow-up in 2 to 3 years)   TRANSTHORACIC ECHOCARDIOGRAM  05/31/2022   Normal LV size and function-EF 60 to 65%.  No RWMA. ??  Normal diastolic parameters but elevated LAP?  Normal RV with normal RVP and RAP.  Manage mitral valve with mild MR and no MS.  Mild to moderate AI with aortic sclerosis but no stenosis.   TUBAL LIGATION  1996   Patient Active Problem List   Diagnosis Date Noted   Breast cancer (HCC) 12/15/2022   Bilateral malignant neoplasm of breast in female [facility]) 11/02/2022   Allergic reaction to chemical substance 01/30/2022   Preventative health care 01/28/2022   Cervical radiculopathy 04/20/2021   Primary osteoarthritis of left knee 03/09/2021   Vitamin D deficiency 07/29/2019   Smoking greater than 40 pack years 07/27/2019   Class 3 severe obesity due to excess calories without serious comorbidity in adult [facility]) 07/26/2019   Genetic testing 01/25/2016   Microscopic hematuria 09/06/2015   Vaginal dryness 12/09/2014   Family history of breast cancer    Malignant neoplasm of upper-outer quadrant of right breast in female, estrogen receptor negative (HCC) 11/14/2014   Intermittent palpitations    Obesity (BMI 35.0-39.9 without comorbidity)    Hyperlipidemia LDL goal <100 07/07/2011   History of partial seizures 04/01/2011   Asthma 04/01/2011   Hot flashes 04/01/2011   Tobacco abuse 04/01/2011   Essential hypertension 04/01/2011   Mitral regurgitation due to cusp prolapse 04/01/2011    PCP: [name], NP  REFERRING PROVIDER: Emelia Loron, MD  REFERRING DIAG: Rt breast cancer with mastectomy   THERAPY DIAG:  Malignant neoplasm of upper-outer quadrant of right breast in female, estrogen receptor negative (HCC)  Stiffness of  left shoulder, not elsewhere classified  Stiffness of right shoulder, not elsewhere classified  Abnormal posture  Aftercare following surgery for neoplasm  ONSET DATE: 12/15/22  Rationale for Evaluation and Treatment: Rehabilitation  SUBJECTIVE:  SUBJECTIVE STATEMENT: The wounds haven't changed. I don't know if I'm going to do the hyperbaric chamber or not. I meet with them to ask questions on 3/17.     PERTINENT HISTORY: Pt. Underwent a Right lumpectomy for Gr. 1 IDC on 03/02/2015 with 0/4 LN's. She had neoadjuvant chemo and radiation. In November of 2023 she noted breast asymmetry and patient was diagnosed on 10/07/2022 with right and left breast grade 2 IDC. It measures .8 cm on the left and a It is Triple Negative with a Ki67 of 95% . Right breast mass is 1.7 cm and 1 cm and Ki 67 of 50%. She had a right mastectomy and left lumpectomy with SLNB on 12/15/2022. Completed chemotherapy and then bil chest wall radiation ending 06/09/23. port removal 07/18/23 with need for re-excision and packing with infection. 2 poor healing wounds on mastectomy incision chronically - has a referral to hyperbaric O2.   PAIN:  Are you having pain? Yes - only when I move a certain way or lay on the Right side NPRS scale: 7/10 Pain location: Rt chest  Pain orientation: Right  PAIN TYPE: aching and tight Pain description: constant  Aggravating factors: reaching and laying on that side Relieving factors: rest  PRECAUTIONS: bil lymphedema risk   RED FLAGS: None   WEIGHT BEARING RESTRICTIONS: No  FALLS:  Has patient fallen in last 6 months? No  LIVING ENVIRONMENT: Lives with: lives with their family and lives with their spouse  OCCUPATION: not working   LEISURE: nothing really.  I would like this to heal so I can exercise  more.    HAND DOMINANCE: can use both   PRIOR LEVEL OF FUNCTION: Independent  PATIENT GOALS: improve motion on both arms    OBJECTIVE: Note: Objective measures were completed at Evaluation unless otherwise noted.  COGNITION: Overall cognitive status: Within functional limits for tasks assessed   PALPATION: +2 ttp Rt UT with more tightness here compared to the Lt side  OBSERVATIONS / OTHER ASSESSMENTS: wearing compression.  Incision healed except for 2 small erasure sized spots that pt has covered with a bandage.  The lateral incision almost folds over on itself.  Did not uncover bandage today.    POSTURE: guarded   UPPER EXTREMITY AROM/PROM:       A/PROM RIGHT   eval 11/28/2022 12/04/23  Shoulder extension 63 45 - pain at the Rt pouch on the chest  Shoulder flexion 140 100 - pulling in the Rt trunk/ 110 pn  Shoulder abduction 135 110 - it made the whole arm hurt / 145 pn  Shoulder internal rotation 65   Shoulder external rotation 84 70 / 65                          (Blank rows = not tested)   A/PROM LEFT   Eval 11/28/22 12/04/23  Shoulder extension 63 60  Shoulder flexion 165 140 / 170  Shoulder abduction 162 140 / 170  Shoulder internal rotation 73   Shoulder external rotation 87 80 / 85                          (Blank rows = not tested)   UPPER EXTREMITY STRENGTH:    LYMPHEDEMA ASSESSMENTS:    LANDMARK RIGHT   baseline  10 cm proximal to olecranon process 38.2  Olecranon process 28.5  10 cm proximal to ulnar styloid process 26.4  Just  proximal to ulnar styloid process 18.05  Across hand at thumb web space 20.1  At base of 2nd digit 6.0  (Blank rows = not tested)   LANDMARK LEFT   baseline  10 cm proximal to olecranon process 38.4  Olecranon process 27.9  10 cm proximal to ulnar styloid process 25.1  Just proximal to ulnar styloid process 17.4  Across hand at thumb web space 19.8  At base of 2nd digit 5.6  (Blank rows = not tested)  QUICK DASH  SURVEY: 43% from 34%                                                                                                                            TREATMENT DATE:  12/13/23: Therapeutic Exercises Pulleys into flex and abd x 2 mins each; pt really liked these and reports would like to do them at home so showed her how to order these online Ball roll up wall into flex x 12, bil abd x 5 each returning therapist demo. Spent time educating pt about proper technique. Manual Therapy P/ROM to Rt shoulder into flex, abd and D2 with scapular depression throughout by therapist and avoiding stretch open wounds STM during P/ROM to Rt pect insertion and lateral trunk where pt palpably tight but avoided open wounds; then briefly at end of session to Rt UT  12/04/23 Eval performed Education on HEP to start at home including UT stretch, scapular retractions, and posterior shoulder rolls.      PATIENT EDUCATION:  Education details: per today's note Person educated: Patient Education method: Programmer, multimedia, Demonstration, and Handouts Education comprehension: verbalized understanding and returned demonstration  HOME EXERCISE PROGRAM: Code not copied: scapular retractions, posterior shoulder rolls, Rt UT stretch    ASSESSMENT:  CLINICAL IMPRESSION: Began AA/ROM to bil shoulders. Then focused on manual therapy to Rt upper quadrant working to improve her end P/ROM. Pt was interested in pulleys so showed her how to order these online.    OBJECTIVE IMPAIRMENTS: decreased knowledge of condition, decreased knowledge of use of DME, decreased ROM, and decreased strength.   ACTIVITY LIMITATIONS: carrying, lifting, and reach over head  PARTICIPATION LIMITATIONS: cleaning and community activity  PERSONAL FACTORS: Age, Fitness, Time since onset of injury/illness/exacerbation, and 1-2 comorbidities: radiation, SLNB  are also affecting patient's functional outcome.   REHAB POTENTIAL: Excellent  CLINICAL DECISION  MAKING: Stable/uncomplicated  EVALUATION COMPLEXITY: Low  GOALS: Goals reviewed with patient? Yes  SHORT TERM GOALS=LTGs: Target date: 01/10/24  Pt will improve Rt shoulder AROM into flexion and abduction equal to the baseline measurements  Baseline: Goal status: INITIAL  2.  Pt will report being able to reach into her cabinet again with the help of her spatula which is normal for her Baseline:  Goal status: INITIAL  3.  Pt will improve Lt shoulder flexion and abduction to baseline measurements  Baseline:  Goal status: INITIAL  4.  Pt will be ind with final HEP Baseline:  Goal status:  INITIAL  5.  Pt will be educated on ABC video to watch for lymphedema education review Baseline:  Goal status: INITIAL   PLAN:  PT FREQUENCY: 1-2x/week  PT DURATION: 5 weeks   PLANNED INTERVENTIONS: 97164- PT Re-evaluation, 97110-Therapeutic exercises, 97530- Therapeutic activity, 97112- Neuromuscular re-education, 97535- Self Care, 16109- Manual therapy, Patient/Family education, Therapeutic exercises, Therapeutic activity, Neuromuscular re-education, Gait training, and Self Care  PLAN FOR NEXT SESSION: check incision open spots if uncovered before stretching, then bil shoulder AAROM/PROM, include Rt UT work   Hermenia Bers, PTA 12/13/2023, 5:14 PM

## 2023-12-25 ENCOUNTER — Encounter: Payer: HMO | Admitting: Rehabilitation

## 2023-12-25 ENCOUNTER — Encounter (HOSPITAL_BASED_OUTPATIENT_CLINIC_OR_DEPARTMENT_OTHER): Payer: HMO | Attending: Internal Medicine | Admitting: Internal Medicine

## 2023-12-25 DIAGNOSIS — L598 Other specified disorders of the skin and subcutaneous tissue related to radiation: Secondary | ICD-10-CM | POA: Diagnosis not present

## 2023-12-25 DIAGNOSIS — L98492 Non-pressure chronic ulcer of skin of other sites with fat layer exposed: Secondary | ICD-10-CM | POA: Insufficient documentation

## 2023-12-25 DIAGNOSIS — S21001A Unspecified open wound of right breast, initial encounter: Secondary | ICD-10-CM | POA: Diagnosis not present

## 2023-12-25 DIAGNOSIS — C50911 Malignant neoplasm of unspecified site of right female breast: Secondary | ICD-10-CM | POA: Insufficient documentation

## 2023-12-25 DIAGNOSIS — C50912 Malignant neoplasm of unspecified site of left female breast: Secondary | ICD-10-CM | POA: Diagnosis not present

## 2023-12-26 ENCOUNTER — Ambulatory Visit: Payer: HMO | Admitting: Family

## 2023-12-26 DIAGNOSIS — L598 Other specified disorders of the skin and subcutaneous tissue related to radiation: Secondary | ICD-10-CM | POA: Diagnosis not present

## 2023-12-27 ENCOUNTER — Ambulatory Visit: Payer: Self-pay

## 2023-12-27 ENCOUNTER — Ambulatory Visit: Payer: HMO | Admitting: Obstetrics & Gynecology

## 2023-12-27 ENCOUNTER — Encounter: Payer: Self-pay | Admitting: Obstetrics & Gynecology

## 2023-12-27 VITALS — BP 137/56 | HR 73 | Ht 61.0 in | Wt 215.0 lb

## 2023-12-27 DIAGNOSIS — R293 Abnormal posture: Secondary | ICD-10-CM

## 2023-12-27 DIAGNOSIS — C50411 Malignant neoplasm of upper-outer quadrant of right female breast: Secondary | ICD-10-CM | POA: Diagnosis not present

## 2023-12-27 DIAGNOSIS — C50912 Malignant neoplasm of unspecified site of left female breast: Secondary | ICD-10-CM

## 2023-12-27 DIAGNOSIS — Z01419 Encounter for gynecological examination (general) (routine) without abnormal findings: Secondary | ICD-10-CM

## 2023-12-27 DIAGNOSIS — M25611 Stiffness of right shoulder, not elsewhere classified: Secondary | ICD-10-CM

## 2023-12-27 DIAGNOSIS — Z1339 Encounter for screening examination for other mental health and behavioral disorders: Secondary | ICD-10-CM | POA: Diagnosis not present

## 2023-12-27 DIAGNOSIS — Z171 Estrogen receptor negative status [ER-]: Secondary | ICD-10-CM

## 2023-12-27 DIAGNOSIS — M25612 Stiffness of left shoulder, not elsewhere classified: Secondary | ICD-10-CM

## 2023-12-27 DIAGNOSIS — C50911 Malignant neoplasm of unspecified site of right female breast: Secondary | ICD-10-CM | POA: Diagnosis not present

## 2023-12-27 DIAGNOSIS — Z483 Aftercare following surgery for neoplasm: Secondary | ICD-10-CM

## 2023-12-27 DIAGNOSIS — Z90710 Acquired absence of both cervix and uterus: Secondary | ICD-10-CM | POA: Diagnosis not present

## 2023-12-27 NOTE — Therapy (Signed)
 OUTPATIENT PHYSICAL THERAPY  UPPER EXTREMITY ONCOLOGY TREATMENT  Patient Name: Katie Jacobson MRN: [id number] DOB:23-Feb-1956, 68 y.o., female Today's Date: 12/27/2023  END OF SESSION:  PT End of Session - 12/27/23 1513     Visit Number 3    Number of Visits 11    Date for PT Re-Evaluation 01/10/24    Authorization Type none    PT Start Time 1505    PT Stop Time 1600    PT Time Calculation (min) 55 min    Activity Tolerance Patient tolerated treatment well    Behavior During Therapy [facility] for tasks assessed/performed             Past Medical History:  Diagnosis Date   Allergy    Arthritis    Breast cancer of upper-outer quadrant of right female breast (HCC) 11/14/2014   Treated with chemotherapy and radiation (diagnosed again in March 2024 in both breasts)   Heart murmur    History of chemotherapy    finished chemo 02/03/2015   History of kidney stones    History of seizures    as a child - unknown cause - states was never on anticonvulsants   Hypertension    states under control with meds., has been on med. x "years"   Mitral regurgitation    Mitral valve prolapse 1990   Echo 2023 with no MVP. Mild-mod aortic regurg and mild mitral regurg. 3 yr followup recommended.   Osteoporosis    Personal history of chemotherapy    Personal history of radiation therapy    Port-A-Cath in place 01/24/2023   Seasonal allergies    Past Surgical History:  Procedure Laterality Date   ABDOMINAL HYSTERECTOMY  1998   partial   APPENDECTOMY  1976   BREAST BIOPSY Right 09/30/2022   [place] RT BREAST BX W LOC DEV 1ST LESION IMG BX SPEC US GUIDE 09/30/2022 [facility]   BREAST BIOPSY Left 09/30/2022   [place] LT BREAST BX W LOC DEV 1ST LESION IMG BX SPEC US GUIDE 09/30/2022 [facility]   BREAST BIOPSY Right 09/30/2022   [place] RT BREAST BX W LOC DEV EA ADD LESION IMG BX SPEC US GUIDE 09/30/2022 [facility]   BREAST BIOPSY  12/14/2022   MM LT RADIOACTIVE SEED LOC MAMMO GUIDE  12/14/2022 [facility]   BREAST LUMPECTOMY Right 02/2015   BREAST LUMPECTOMY WITH RADIOACTIVE SEED AND SENTINEL LYMPH NODE BIOPSY Left 12/15/2022   Procedure: LEFT BREAST LUMPECTOMY WITH RADIOACTIVE SEED AND AXILLARY SENTINEL LYMPH NODE BIOPSY;  Surgeon: Emelia Loron, MD;  [facility];  Service: General;  Laterality: Left;   COLONOSCOPY  09/30/2016   COMPLEX WOUND CLOSURE N/A 07/18/2023   Procedure: CLOSURE CHEST WALL WOUND X2;  Surgeon: Emelia Loron, MD;  Location[facility] OR;  Service: General;  Laterality: N/A;   KNEE ARTHROSCOPY Left 03/26/2008   MASTECTOMY     MASTECTOMY W/ SENTINEL NODE BIOPSY Right 12/15/2022   Procedure: RIGHT MASTECTOMY WITH RIGHT AXILLARY SENTINEL LYMPH NODE BIOPSY;  Surgeon: Emelia Loron, MD;  Location: [facility];  Service: General;  Laterality: Right;  180 MIN ROOM 9   PLANTAR'S WART EXCISION Left    x 2   PORT-A-CATH REMOVAL Left 04/06/2015   Procedure: REMOVAL PORT-A-CATH;  Surgeon: Emelia Loron, MD;  Location: [facility];  Service: General;  Laterality: Left;   PORT-A-CATH REMOVAL N/A 07/18/2023   Procedure: REMOVAL PORT-A-CATH;  Surgeon: Emelia Loron, MD;  Location: [facility] OR;  Service: General;  Laterality: N/A;  PORTACATH PLACEMENT N/A 11/27/2014   Procedure: INSERTION PORT-A-CATH;  Surgeon: Emelia Loron, MD;  Location: [facility];  Service: General;  Laterality: N/A;   PORTACATH PLACEMENT Right 12/15/2022   Procedure: INSERTION PORT-A-CATH;  Surgeon: Emelia Loron, MD;  Location: [facility] OR;  Service: General;  Laterality: Right;   RADIOACTIVE SEED GUIDED PARTIAL MASTECTOMY WITH AXILLARY SENTINEL LYMPH NODE BIOPSY Right 03/02/2015   Procedure: RADIOACTIVE SEED GUIDED RIGHT PARTIAL MASTECTOMY WITH RIGHT AXILLARY SENTINEL LYMPH NODE BIOPSY;  Surgeon: Emelia Loron, MD;  Location: [facility];  Service: General;  Laterality: Right;   SALIVARY STONE REMOVAL Right 1972   TONSILLECTOMY   1970s   TRANSTHORACIC ECHOCARDIOGRAM  01/16/2020   EF 60 to 65%.  GRII DD.  No R WMA.  Normal RV size and function.  Rheumatic mitral valve with moderate thickening-hockey-stick appearing.  Moderate MR with mild MS.  Moderate LA dilation.  (Recommend follow-up in 2 to 3 years)   TRANSTHORACIC ECHOCARDIOGRAM  05/31/2022   Normal LV size and function-EF 60 to 65%.  No RWMA. ??  Normal diastolic parameters but elevated LAP?  Normal RV with normal RVP and RAP.  Manage mitral valve with mild MR and no MS.  Mild to moderate AI with aortic sclerosis but no stenosis.   TUBAL LIGATION  1996   Patient Active Problem List   Diagnosis Date Noted   Breast cancer (HCC) 12/15/2022   Bilateral malignant neoplasm of breast in female [facility]) 11/02/2022   Allergic reaction to chemical substance 01/30/2022   Preventative health care 01/28/2022   Cervical radiculopathy 04/20/2021   Primary osteoarthritis of left knee 03/09/2021   Vitamin D deficiency 07/29/2019   Smoking greater than 40 pack years 07/27/2019   Class 3 severe obesity due to excess calories without serious comorbidity in adult [facility]) 07/26/2019   Genetic testing 01/25/2016   Microscopic hematuria 09/06/2015   Vaginal dryness 12/09/2014   Family history of breast cancer    Malignant neoplasm of upper-outer quadrant of right breast in female, estrogen receptor negative (HCC) 11/14/2014   Intermittent palpitations    Obesity (BMI 35.0-39.9 without comorbidity)    Hyperlipidemia LDL goal <100 07/07/2011   History of partial seizures 04/01/2011   Asthma 04/01/2011   Hot flashes 04/01/2011   Tobacco abuse 04/01/2011   Essential hypertension 04/01/2011   Mitral regurgitation due to cusp prolapse 04/01/2011    PCP: [name], NP  REFERRING PROVIDER: Emelia Loron, MD  REFERRING DIAG: Rt breast cancer with mastectomy   THERAPY DIAG:  Malignant neoplasm of upper-outer quadrant of right breast in female, estrogen receptor negative  (HCC)  Stiffness of left shoulder, not elsewhere classified  Stiffness of right shoulder, not elsewhere classified  Abnormal posture  Aftercare following surgery for neoplasm  ONSET DATE: 12/15/22  Rationale for Evaluation and Treatment: Rehabilitation  SUBJECTIVE:  SUBJECTIVE STATEMENT: I can tell my arms moving a bit better since I started back coming here and doing the stretches again at home. I went to the wound center Monday and I didn't get to see the hyperbaric chamber. However, they did clean my wounds out and she gave me some ointment to put on the wounds and some more dressings to cover it with.     PERTINENT HISTORY: Pt. Underwent a Right lumpectomy for Gr. 1 IDC on 03/02/2015 with 0/4 LN's. She had neoadjuvant chemo and radiation. In November of 2023 she noted breast asymmetry and patient was diagnosed on 10/07/2022 with right and left breast grade 2 IDC. It measures .8 cm on the left and a It is Triple Negative with a Ki67 of 95% . Right breast mass is 1.7 cm and 1 cm and Ki 67 of 50%. She had a right mastectomy and left lumpectomy with SLNB on 12/15/2022. Completed chemotherapy and then bil chest wall radiation ending 06/09/23. port removal 07/18/23 with need for re-excision and packing with infection. 2 poor healing wounds on mastectomy incision chronically - has a referral to hyperbaric O2.   PAIN:  Are you having pain? Yes but just feeling tender where they dug in my wounds on Monday.   PRECAUTIONS: bil lymphedema risk   RED FLAGS: None   WEIGHT BEARING RESTRICTIONS: No  FALLS:  Has patient fallen in last 6 months? No  LIVING ENVIRONMENT: Lives with: lives with their family and lives with their spouse  OCCUPATION: not working   LEISURE: nothing really.  I would like this to heal so I  can exercise more.    HAND DOMINANCE: can use both   PRIOR LEVEL OF FUNCTION: Independent  PATIENT GOALS: improve motion on both arms    OBJECTIVE: Note: Objective measures were completed at Evaluation unless otherwise noted.  COGNITION: Overall cognitive status: Within functional limits for tasks assessed   PALPATION: +2 ttp Rt UT with more tightness here compared to the Lt side  OBSERVATIONS / OTHER ASSESSMENTS: wearing compression.  Incision healed except for 2 small erasure sized spots that pt has covered with a bandage.  The lateral incision almost folds over on itself.  Did not uncover bandage today.    POSTURE: guarded   UPPER EXTREMITY AROM/PROM:       A/PROM RIGHT   eval 11/28/2022 12/04/23  Shoulder extension 63 45 - pain at the Rt pouch on the chest  Shoulder flexion 140 100 - pulling in the Rt trunk/ 110 pn  Shoulder abduction 135 110 - it made the whole arm hurt / 145 pn  Shoulder internal rotation 65   Shoulder external rotation 84 70 / 65                          (Blank rows = not tested)   A/PROM LEFT   Eval 11/28/22 12/04/23  Shoulder extension 63 60  Shoulder flexion 165 140 / 170  Shoulder abduction 162 140 / 170  Shoulder internal rotation 73   Shoulder external rotation 87 80 / 85                          (Blank rows = not tested)   UPPER EXTREMITY STRENGTH:    LYMPHEDEMA ASSESSMENTS:    LANDMARK RIGHT   baseline  10 cm proximal to olecranon process 38.2  Olecranon process 28.5  10 cm proximal to ulnar  styloid process 26.4  Just proximal to ulnar styloid process 18.05  Across hand at thumb web space 20.1  At base of 2nd digit 6.0  (Blank rows = not tested)   LANDMARK LEFT   baseline  10 cm proximal to olecranon process 38.4  Olecranon process 27.9  10 cm proximal to ulnar styloid process 25.1  Just proximal to ulnar styloid process 17.4  Across hand at thumb web space 19.8  At base of 2nd digit 5.6  (Blank rows = not  tested)  QUICK DASH SURVEY: 43% from 34%                                                                                                                            TREATMENT DATE:  12/27/23: Therapeutic Exercises Pulleys into flex and abd x 2:30-3 mins each, VC's to remind pt of technique Ball roll up wall into flex x 10, bil abd x 5 each returning therapist demo.  Bil UE abd in a "snow angel" x 5, 5 sec holds Manual Therapy P/ROM to Rt shoulder into flex, abd and D2 with scapular depression throughout by therapist and avoiding stretch to open wounds with blocking at pectoralis muscle STM during P/ROM to Rt pect insertion and lateral trunk where pt palpably tight staying above and lateral to bandage avoiding pull to open wounds Pt requested therapist to check her wounds as they are hard for her to see. Eraser size larger opening appears the same as when this therapist saw her last, unable to see clearly pinpoint opening without removing pts wound dressing completely so let this be.   12/13/23: Therapeutic Exercises Pulleys into flex and abd x 2 mins each; pt really liked these and reports would like to do them at home so showed her how to order these online Ball roll up wall into flex x 12, bil abd x 5 each returning therapist demo. Spent time educating pt about proper technique. Manual Therapy P/ROM to Rt shoulder into flex, abd and D2 with scapular depression throughout by therapist and avoiding stretch open wounds STM during P/ROM to Rt pect insertion and lateral trunk where pt palpably tight but avoided open wounds; then briefly at end of session to Rt UT  12/04/23 Eval performed Education on HEP to start at home including UT stretch, scapular retractions, and posterior shoulder rolls.      PATIENT EDUCATION:  Education details: per today's note Person educated: Patient Education method: Programmer, multimedia, Demonstration, and Handouts Education comprehension: verbalized understanding and  returned demonstration  HOME EXERCISE PROGRAM: Code not copied: scapular retractions, posterior shoulder rolls, Rt UT stretch    ASSESSMENT:  CLINICAL IMPRESSION: Continued AA/ROM and A/ROM stretches for Rt shoulder then continued with Rt UE manual therapy. Pts end P/ROM was improved today from last session.    OBJECTIVE IMPAIRMENTS: decreased knowledge of condition, decreased knowledge of use of DME, decreased ROM, and decreased strength.   ACTIVITY LIMITATIONS: carrying, lifting, and reach over  head  PARTICIPATION LIMITATIONS: cleaning and community activity  PERSONAL FACTORS: Age, Fitness, Time since onset of injury/illness/exacerbation, and 1-2 comorbidities: radiation, SLNB  are also affecting patient's functional outcome.   REHAB POTENTIAL: Excellent  CLINICAL DECISION MAKING: Stable/uncomplicated  EVALUATION COMPLEXITY: Low  GOALS: Goals reviewed with patient? Yes  SHORT TERM GOALS=LTGs: Target date: 01/10/24  Pt will improve Rt shoulder AROM into flexion and abduction equal to the baseline measurements  Baseline: Goal status: INITIAL  2.  Pt will report being able to reach into her cabinet again with the help of her spatula which is normal for her Baseline:  Goal status: INITIAL  3.  Pt will improve Lt shoulder flexion and abduction to baseline measurements  Baseline:  Goal status: INITIAL  4.  Pt will be ind with final HEP Baseline:  Goal status: INITIAL  5.  Pt will be educated on ABC video to watch for lymphedema education review Baseline:  Goal status: INITIAL   PLAN:  PT FREQUENCY: 1-2x/week  PT DURATION: 5 weeks   PLANNED INTERVENTIONS: 97164- PT Re-evaluation, 97110-Therapeutic exercises, 97530- Therapeutic activity, 97112- Neuromuscular re-education, 97535- Self Care, 42595- Manual therapy, Patient/Family education, Therapeutic exercises, Therapeutic activity, Neuromuscular re-education, Gait training, and Self Care  PLAN FOR NEXT SESSION:  check incision open spots if uncovered before stretching, then bil shoulder AAROM/PROM, include Rt UT work   Hermenia Bers, PTA 12/27/2023, 4:06 PM

## 2023-12-27 NOTE — Progress Notes (Signed)
 GYNECOLOGY CLINIC ANNUAL PREVENTATIVE CARE ENCOUNTER NOTE  Subjective:   Katie Jacobson is a 68 y.o. G52P2001 female here for a routine annual gynecologic exam.  Current complaints: none, s/p treatment for bilateral breast ca, h/o TAH for menorrhagia.   Denies abnormal vaginal bleeding, discharge, pelvic pain, problems with intercourse or other gynecologic concerns.    Gynecologic History Patient's last menstrual period was 10/10/1996. Contraception: status post hysterectomy Last Pap: 2016. Results were: normal Last mammogram: 2024. Results were: benign, left side  Obstetric History OB History  Gravida Para Term Preterm AB Living  2 2 2   0 1  SAB IAB Ectopic Multiple Live Births      2    # Outcome Date GA Lbr Len/2nd Weight Sex Type Anes PTL Lv  2 Term 07/12/91    F Vag-Spont   DEC  1 Term 04/09/86    F CS-LTranv   LIV    Past Medical History:  Diagnosis Date   Allergy    Arthritis    Breast cancer of upper-outer quadrant of right female breast (HCC) 11/14/2014   Treated with chemotherapy and radiation (diagnosed again in March 2024 in both breasts)   Heart murmur    History of chemotherapy    finished chemo 02/03/2015   History of kidney stones    History of seizures    as a child - unknown cause - states was never on anticonvulsants   Hypertension    states under control with meds., has been on med. x "years"   Mitral regurgitation    Mitral valve prolapse 1990   Echo 2023 with no MVP. Mild-mod aortic regurg and mild mitral regurg. 3 yr followup recommended.   Osteoporosis    Personal history of chemotherapy    Personal history of radiation therapy    Port-A-Cath in place 01/24/2023   Seasonal allergies     Past Surgical History:  Procedure Laterality Date   ABDOMINAL HYSTERECTOMY  1998   partial   APPENDECTOMY  1976   BREAST BIOPSY Right 09/30/2022   [place] RT BREAST BX W LOC DEV 1ST LESION IMG BX SPEC US GUIDE 09/30/2022 [facility]   BREAST BIOPSY Left  09/30/2022   [place] LT BREAST BX W LOC DEV 1ST LESION IMG BX SPEC US GUIDE 09/30/2022 [facility]   BREAST BIOPSY Right 09/30/2022   [place] RT BREAST BX W LOC DEV EA ADD LESION IMG BX SPEC US GUIDE 09/30/2022 [facility]   BREAST BIOPSY  12/14/2022   MM LT RADIOACTIVE SEED LOC MAMMO GUIDE 12/14/2022 [facility]   BREAST LUMPECTOMY Right 02/2015   BREAST LUMPECTOMY WITH RADIOACTIVE SEED AND SENTINEL LYMPH NODE BIOPSY Left 12/15/2022   Procedure: LEFT BREAST LUMPECTOMY WITH RADIOACTIVE SEED AND AXILLARY SENTINEL LYMPH NODE BIOPSY;  Surgeon: Emelia Loron, MD;  Location[facility];  Service: General;  Laterality: Left;   COLONOSCOPY  09/30/2016   COMPLEX WOUND CLOSURE N/A 07/18/2023   Procedure: CLOSURE CHEST WALL WOUND X2;  Surgeon: Emelia Loron, MD;  Location: [facility] OR;  Service: General;  Laterality: N/A;   KNEE ARTHROSCOPY Left 03/26/2008   MASTECTOMY     MASTECTOMY W/ SENTINEL NODE BIOPSY Right 12/15/2022   Procedure: RIGHT MASTECTOMY WITH RIGHT AXILLARY SENTINEL LYMPH NODE BIOPSY;  Surgeon: Emelia Loron, MD;  Location: [facility];  Service: General;  Laterality: Right;  180 MIN ROOM 9   PLANTAR'S WART EXCISION Left    x 2   PORT-A-CATH REMOVAL Left 04/06/2015   Procedure: REMOVAL PORT-A-CATH;  Surgeon: Emelia Loron, MD;  Location: [facility];  Service: General;  Laterality: Left;   PORT-A-CATH REMOVAL N/A 07/18/2023   Procedure: REMOVAL PORT-A-CATH;  Surgeon: Emelia Loron, MD;  Location: [facility] OR;  Service: General;  Laterality: N/A;   PORTACATH PLACEMENT N/A 11/27/2014   Procedure: INSERTION PORT-A-CATH;  Surgeon: Emelia Loron, MD;  Location: [facility];  Service: General;  Laterality: N/A;   PORTACATH PLACEMENT Right 12/15/2022   Procedure: INSERTION PORT-A-CATH;  Surgeon: Emelia Loron, MD;  Location: [facility] OR;  Service: General;  Laterality: Right;   RADIOACTIVE SEED GUIDED PARTIAL MASTECTOMY WITH AXILLARY SENTINEL LYMPH  NODE BIOPSY Right 03/02/2015   Procedure: RADIOACTIVE SEED GUIDED RIGHT PARTIAL MASTECTOMY WITH RIGHT AXILLARY SENTINEL LYMPH NODE BIOPSY;  Surgeon: Emelia Loron, MD;  Location: [facility];  Service: General;  Laterality: Right;   SALIVARY STONE REMOVAL Right 1972   TONSILLECTOMY  1970s   TRANSTHORACIC ECHOCARDIOGRAM  01/16/2020   EF 60 to 65%.  GRII DD.  No R WMA.  Normal RV size and function.  Rheumatic mitral valve with moderate thickening-hockey-stick appearing.  Moderate MR with mild MS.  Moderate LA dilation.  (Recommend follow-up in 2 to 3 years)   TRANSTHORACIC ECHOCARDIOGRAM  05/31/2022   Normal LV size and function-EF 60 to 65%.  No RWMA. ??  Normal diastolic parameters but elevated LAP?  Normal RV with normal RVP and RAP.  Manage mitral valve with mild MR and no MS.  Mild to moderate AI with aortic sclerosis but no stenosis.   TUBAL LIGATION  1996    Current Outpatient Medications on File Prior to Visit  Medication Sig Dispense Refill   albuterol (VENTOLIN HFA) 108 (90 Base) MCG/ACT inhaler Inhale 1-2 puffs into the lungs every 6 (six) hours as needed for wheezing or shortness of breath. 1 each 0   aspirin 81 MG tablet Take 1 tablet (81 mg total) by mouth daily. 30 tablet 0   chlorthalidone (HYGROTON) 25 MG tablet TAKE 1 TABLET(25 MG) BY MOUTH EVERY OTHER DAY (Patient taking differently: Take 25 mg by mouth every Monday, Wednesday, and Friday.) 45 tablet 1   enalapril (VASOTEC) 20 MG tablet TAKE 1 TABLET BY MOUTH EVERYDAY AT BEDTIME 30 tablet 2   gabapentin (NEURONTIN) 100 MG capsule Take 1 capsule by mouth at bedtime.     metoprolol succinate (TOPROL-XL) 100 MG 24 hr tablet TAKE 1 TABLET BY MOUTH DAILY. TAKE WITH OR IMMEDIATELY FOLLOWING A MEAL. 30 tablet 2   No current facility-administered medications on file prior to visit.    Allergies  Allergen Reactions   Sulfa Antibiotics Other (See Comments)    UNKNOWN    Social History   Socioeconomic History    Marital status: Married    Spouse name: Not on file   Number of children: 1   Years of education: Not on file   Highest education level: Not on file  Occupational History   Not on file  Tobacco Use   Smoking status: Some Days    Current packs/day: 0.50    Average packs/day: 0.5 packs/day for 43.0 years (21.5 ttl pk-yrs)    Types: Cigarettes   Smokeless tobacco: Never  Vaping Use   Vaping status: Never Used  Substance and Sexual Activity   Alcohol use: Yes    Alcohol/week: 5.0 standard drinks of alcohol    Types: 5 Standard drinks or equivalent per week    Comment: occ    Drug use: No   Sexual  activity: Yes    Partners: Male  Other Topics Concern   Not on file  Social History Narrative   Married with 2 daughters- born 41 and 69 ([name] died in MVA Jan 09, 2016)   Previously worked -- Therapist, 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  Has been doing part time work with [employer] -- had 6 active clients, but the most important one is her Daughter.   Regular exercise:  No   Caffeine Use: none; Current 1ppd smoker x > 39 yrs; EtOH ~4 oz/week   Social Drivers of Corporate investment banker Strain: Not on file  Food Insecurity: No Food Insecurity (04/11/2023)   Hunger Vital Sign    Worried About Running Out of Food in the Last Year: Never true    Ran Out of Food in the Last Year: Never true  Transportation Needs: No Transportation Needs (04/11/2023)   PRAPARE - Administrator, Civil Service (Medical): No    Lack of Transportation (Non-Medical): No  Physical Activity: Not on file  Stress: Not on file  Social Connections: Unknown (02/22/2022)   Received from [facility]   Social Network    Social Network: Not on file  Intimate Partner Violence: Not At Risk (04/11/2023)   Humiliation, Afraid, Rape, and Kick questionnaire    Fear of Current or Ex-Partner: No    Emotionally Abused: No    Physically Abused: No    Sexually Abused: No     Family History  Problem Relation Age of Onset   Heart disease Mother    Stroke Mother    Hypertension Mother    Diabetes Mother    Heart attack Father    Diabetes Sister    Diabetes Sister    Kidney disease Brother    Mental retardation Brother    Colon polyps Daughter    Cancer Cousin    Breast cancer Cousin        deceased 81   Colon cancer Neg Hx    Esophageal cancer Neg Hx    Rectal cancer Neg Hx    Stomach cancer Neg Hx     The following portions of the patient's history were reviewed and updated as appropriate: allergies, current medications, past family history, past medical history, past social history, past surgical history and problem list.  Review of Systems Constitutional: negative Respiratory: negative Cardiovascular: negative Gastrointestinal: negative Genitourinary:negative   Objective:  BP (!) 137/56 (BP Location: Left Arm, Cuff Size: Normal)   Pulse 73   Ht 5\' 1"  (1.549 m)   Wt 215 lb (97.5 kg)   LMP 10/10/1996   BMI 40.62 kg/m  CONSTITUTIONAL: Well-developed, well-nourished female in no acute distress.  HENT:  Normocephalic, atraumatic, External right and left ear normal. Oropharynx is clear and moist EYES: Conjunctivae and EOM are normal. Pupils are equal, round, and reactive to light. No scleral icterus.  NECK: Normal range of motion, supple, no masses.  Normal thyroid.  SKIN: Skin is warm and dry. No rash noted. Not diaphoretic. No erythema. No pallor. NEUROLGIC: Alert and oriented to person, place, and time. Normal reflexes, muscle tone coordination. No cranial nerve deficit noted. PSYCHIATRIC: Normal mood and affect. Normal behavior. Normal judgment and thought content. CARDIOVASCULAR: Normal heart rate noted, regular rhythm RESPIRATORY: Clear to auscultation bilaterally. Effort and breath sounds normal, no problems with respiration noted. BREASTS: right s/p mastectomy, with skin graft and dressing. Left no mass, well healed scar ABDOMEN:  Soft,  normal bowel sounds, no distention noted.  No tenderness, rebound or guarding.  PELVIC: Normal appearing external genitalia; normal appearing vaginal mucosa . No cervix  No abnormal discharge noted.  no other palpable masses, uterine or adnexal tenderness. MUSCULOSKELETAL: Normal range of motion. No tenderness.  No cyanosis, clubbing, or edema.    Assessment:  Annual gynecologic examination with pap smear   Plan:  Gyn f/u prn No pap needed Routine preventative health maintenance measures emphasized. Please refer to After Visit Summary for other counseling recommendations.    Scheryl Darter, MD Attending Obstetrician & Gynecologist Center for [employer], [facility]

## 2023-12-27 NOTE — Progress Notes (Addendum)
 68 y.o. New GYN presents for AEX.  HO breast Cancer, Pt had a Mastectomy (R) and Lumpectomy (L).  HO hysterectomy.

## 2023-12-28 ENCOUNTER — Encounter: Payer: HMO | Admitting: Rehabilitation

## 2023-12-29 ENCOUNTER — Ambulatory Visit: Payer: HMO | Admitting: Family

## 2024-01-02 ENCOUNTER — Ambulatory Visit: Payer: HMO | Admitting: Rehabilitation

## 2024-01-02 ENCOUNTER — Encounter: Payer: Self-pay | Admitting: Rehabilitation

## 2024-01-02 DIAGNOSIS — M25611 Stiffness of right shoulder, not elsewhere classified: Secondary | ICD-10-CM

## 2024-01-02 DIAGNOSIS — Z483 Aftercare following surgery for neoplasm: Secondary | ICD-10-CM

## 2024-01-02 DIAGNOSIS — C50411 Malignant neoplasm of upper-outer quadrant of right female breast: Secondary | ICD-10-CM

## 2024-01-02 DIAGNOSIS — Z171 Estrogen receptor negative status [ER-]: Secondary | ICD-10-CM

## 2024-01-02 DIAGNOSIS — R293 Abnormal posture: Secondary | ICD-10-CM

## 2024-01-02 DIAGNOSIS — M25612 Stiffness of left shoulder, not elsewhere classified: Secondary | ICD-10-CM

## 2024-01-02 NOTE — Therapy (Signed)
 OUTPATIENT PHYSICAL THERAPY  UPPER EXTREMITY ONCOLOGY TREATMENT  Patient Name: Katie Jacobson MRN: [id number] DOB:08-23-1956, 68 y.o., female Today's Date: 01/02/2024  END OF SESSION:  PT End of Session - 01/02/24 1914     Visit Number 4    Number of Visits 11    Date for PT Re-Evaluation 01/10/24    PT Start Time 1303    PT Stop Time 1357    PT Time Calculation (min) 54 min    Activity Tolerance Patient tolerated treatment well    Behavior During Therapy [facility] for tasks assessed/performed              Past Medical History:  Diagnosis Date   Allergy    Arthritis    Breast cancer of upper-outer quadrant of right female breast (HCC) 11/14/2014   Treated with chemotherapy and radiation (diagnosed again in March 2024 in both breasts)   Heart murmur    History of chemotherapy    finished chemo 02/03/2015   History of kidney stones    History of seizures    as a child - unknown cause - states was never on anticonvulsants   Hypertension    states under control with meds., has been on med. x "years"   Mitral regurgitation    Mitral valve prolapse 1990   Echo 2023 with no MVP. Mild-mod aortic regurg and mild mitral regurg. 3 yr followup recommended.   Osteoporosis    Personal history of chemotherapy    Personal history of radiation therapy    Port-A-Cath in place 01/24/2023   Seasonal allergies    Past Surgical History:  Procedure Laterality Date   ABDOMINAL HYSTERECTOMY  1998   partial   APPENDECTOMY  1976   BREAST BIOPSY Right 09/30/2022   [place] RT BREAST BX W LOC DEV 1ST LESION IMG BX SPEC US GUIDE 09/30/2022 [facility]   BREAST BIOPSY Left 09/30/2022   [place] LT BREAST BX W LOC DEV 1ST LESION IMG BX SPEC US GUIDE 09/30/2022 [facility]   BREAST BIOPSY Right 09/30/2022   [place] RT BREAST BX W LOC DEV EA ADD LESION IMG BX SPEC US GUIDE 09/30/2022 [facility]   BREAST BIOPSY  12/14/2022   MM LT RADIOACTIVE SEED LOC MAMMO GUIDE 12/14/2022 [facility]   BREAST LUMPECTOMY Right 02/2015   BREAST LUMPECTOMY WITH RADIOACTIVE SEED AND SENTINEL LYMPH NODE BIOPSY Left 12/15/2022   Procedure: LEFT BREAST LUMPECTOMY WITH RADIOACTIVE SEED AND AXILLARY SENTINEL LYMPH NODE BIOPSY;  Surgeon: Emelia Loron, MD;  Location: [facility] OR;  Service: General;  Laterality: Left;   COLONOSCOPY  09/30/2016   COMPLEX WOUND CLOSURE N/A 07/18/2023   Procedure: CLOSURE CHEST WALL WOUND X2;  Surgeon: Emelia Loron, MD;  Location: [facility] OR;  Service: General;  Laterality: N/A;   KNEE ARTHROSCOPY Left 03/26/2008   MASTECTOMY     MASTECTOMY W/ SENTINEL NODE BIOPSY Right 12/15/2022   Procedure: RIGHT MASTECTOMY WITH RIGHT AXILLARY SENTINEL LYMPH NODE BIOPSY;  Surgeon: Emelia Loron, MD;  Location: [facility] OR;  Service: General;  Laterality: Right;  180 MIN ROOM 9   PLANTAR'S WART EXCISION Left    x 2   PORT-A-CATH REMOVAL Left 04/06/2015   Procedure: REMOVAL PORT-A-CATH;  Surgeon: Emelia Loron, MD;  Location: [facility];  Service: General;  Laterality: Left;   PORT-A-CATH REMOVAL N/A 07/18/2023   Procedure: REMOVAL PORT-A-CATH;  Surgeon: Emelia Loron, MD;  Location: [facility] OR;  Service: General;  Laterality: N/A;   PORTACATH PLACEMENT N/A 11/27/2014  Procedure: INSERTION PORT-A-CATH;  Surgeon: Emelia Loron, MD;  Location: [facility];  Service: General;  Laterality: N/A;   PORTACATH PLACEMENT Right 12/15/2022   Procedure: INSERTION PORT-A-CATH;  Surgeon: Emelia Loron, MD;  Location: [facility] OR;  Service: General;  Laterality: Right;   RADIOACTIVE SEED GUIDED PARTIAL MASTECTOMY WITH AXILLARY SENTINEL LYMPH NODE BIOPSY Right 03/02/2015   Procedure: RADIOACTIVE SEED GUIDED RIGHT PARTIAL MASTECTOMY WITH RIGHT AXILLARY SENTINEL LYMPH NODE BIOPSY;  Surgeon: Emelia Loron, MD;  Location: [facility];  Service: General;  Laterality: Right;   SALIVARY STONE REMOVAL Right 1972   TONSILLECTOMY  1970s    TRANSTHORACIC ECHOCARDIOGRAM  01/16/2020   EF 60 to 65%.  GRII DD.  No R WMA.  Normal RV size and function.  Rheumatic mitral valve with moderate thickening-hockey-stick appearing.  Moderate MR with mild MS.  Moderate LA dilation.  (Recommend follow-up in 2 to 3 years)   TRANSTHORACIC ECHOCARDIOGRAM  05/31/2022   Normal LV size and function-EF 60 to 65%.  No RWMA. ??  Normal diastolic parameters but elevated LAP?  Normal RV with normal RVP and RAP.  Manage mitral valve with mild MR and no MS.  Mild to moderate AI with aortic sclerosis but no stenosis.   TUBAL LIGATION  1996   Patient Active Problem List   Diagnosis Date Noted   Breast cancer (HCC) 12/15/2022   Bilateral malignant neoplasm of breast in female [facility]) 11/02/2022   Allergic reaction to chemical substance 01/30/2022   Preventative health care 01/28/2022   Cervical radiculopathy 04/20/2021   Primary osteoarthritis of left knee 03/09/2021   Vitamin D deficiency 07/29/2019   Smoking greater than 40 pack years 07/27/2019   Class 3 severe obesity due to excess calories without serious comorbidity in adult [facility]) 07/26/2019   Genetic testing 01/25/2016   Microscopic hematuria 09/06/2015   Vaginal dryness 12/09/2014   Family history of breast cancer    Malignant neoplasm of upper-outer quadrant of right breast in female, estrogen receptor negative (HCC) 11/14/2014   Intermittent palpitations    Obesity (BMI 35.0-39.9 without comorbidity)    Hyperlipidemia LDL goal <100 07/07/2011   History of partial seizures 04/01/2011   Asthma 04/01/2011   Hot flashes 04/01/2011   Tobacco abuse 04/01/2011   Essential hypertension 04/01/2011   Mitral regurgitation due to cusp prolapse 04/01/2011    PCP: [name], NP  REFERRING PROVIDER: Emelia Loron, MD  REFERRING DIAG: Rt breast cancer with mastectomy   THERAPY DIAG:  Malignant neoplasm of upper-outer quadrant of right breast in female, estrogen receptor negative  (HCC)  Stiffness of right shoulder, not elsewhere classified  Stiffness of left shoulder, not elsewhere classified  Abnormal posture  Aftercare following surgery for neoplasm  ONSET DATE: 12/15/22  Rationale for Evaluation and Treatment: Rehabilitation  SUBJECTIVE:  SUBJECTIVE STATEMENT:  I go tomorrow to see wound care.  Hyperbaric or wound vac are my options.  PT seems to be fine on my wounds   PERTINENT HISTORY: Pt. Underwent a Right lumpectomy for Gr. 1 IDC on 03/02/2015 with 0/4 LN's. She had neoadjuvant chemo and radiation. In November of 2023 she noted breast asymmetry and patient was diagnosed on 10/07/2022 with right and left breast grade 2 IDC. It measures .8 cm on the left and a It is Triple Negative with a Ki67 of 95% . Right breast mass is 1.7 cm and 1 cm and Ki 67 of 50%. She had a right mastectomy and left lumpectomy with SLNB on 12/15/2022. Completed chemotherapy and then bil chest wall radiation ending 06/09/23. port removal 07/18/23 with need for re-excision and packing with infection. 2 poor healing wounds on mastectomy incision chronically - has a referral to hyperbaric O2.   PAIN:  Are you having pain? Yes  4/10 intermittent pain at the wounds   PRECAUTIONS: bil lymphedema risk   RED FLAGS: None   WEIGHT BEARING RESTRICTIONS: No  FALLS:  Has patient fallen in last 6 months? No  LIVING ENVIRONMENT: Lives with: lives with their family and lives with their spouse  OCCUPATION: not working   LEISURE: nothing really.  I would like this to heal so I can exercise more.    HAND DOMINANCE: can use both   PRIOR LEVEL OF FUNCTION: Independent  PATIENT GOALS: improve motion on both arms    OBJECTIVE: Note: Objective measures were completed at Evaluation unless otherwise  noted.  COGNITION: Overall cognitive status: Within functional limits for tasks assessed   PALPATION: +2 ttp Rt UT with more tightness here compared to the Lt side  OBSERVATIONS / OTHER ASSESSMENTS: wearing compression.  Incision healed except for 2 small erasure sized spots that pt has covered with a bandage.  The lateral incision almost folds over on itself.  Did not uncover bandage today.    POSTURE: guarded   UPPER EXTREMITY AROM/PROM:        A/PROM RIGHT   eval 11/28/2022 12/04/23 01/02/24  Shoulder extension 63 45 - pain at the Rt pouch on the chest 55  Shoulder flexion 140 100 - pulling in the Rt trunk/ 110 pn 120 - pulling   Shoulder abduction 135 110 - it made the whole arm hurt / 145 pn 120 - tight   Shoulder internal rotation 65    Shoulder external rotation 84 70 / 65 85                          (Blank rows = not tested)   A/PROM LEFT   Eval 11/28/22 12/04/23 01/02/24  Shoulder extension 63 60   Shoulder flexion 165 140 / 170 145  Shoulder abduction 162 140 / 170 145  Shoulder internal rotation 73    Shoulder external rotation 87 80 / 85                           (Blank rows = not tested)   UPPER EXTREMITY STRENGTH:   LYMPHEDEMA ASSESSMENTS:    LANDMARK RIGHT   baseline  10 cm proximal to olecranon process 38.2  Olecranon process 28.5  10 cm proximal to ulnar styloid process 26.4  Just proximal to ulnar styloid process 18.05  Across hand at thumb web space 20.1  At base of 2nd digit 6.0  (Blank  rows = not tested)   LANDMARK LEFT   baseline  10 cm proximal to olecranon process 38.4  Olecranon process 27.9  10 cm proximal to ulnar styloid process 25.1  Just proximal to ulnar styloid process 17.4  Across hand at thumb web space 19.8  At base of 2nd digit 5.6  (Blank rows = not tested)  QUICK DASH SURVEY: 43% from 34%                                                                                                                           TREATMENT DATE:   01/02/24: Remeasured AROM and discussed pain Therapeutic Exercises Pulleys into flex and abd x 2:30-3 mins each, VC's to remind pt of technique. Used step for more comfortable seated positioning.  Ball roll up wall into flex x 10, bil abd x 5 each returning therapist demo.  Row yellow x 10  Supine chest stretch x 60"  Bil UE abd in a "snow angel" x 5, 5 sec holds Bil shoulder flexion with dowel with with pt getting "stuck" with it overhead so stopped after two  Switched to standing AAROM with dowel flexion in a canoe rowing motion x 5  Manual Therapy P/ROM to bil shoulders into flex, abd and D2 with scapular depression throughout by therapist   12/27/23: Therapeutic Exercises Pulleys into flex and abd x 2:30-3 mins each, VC's to remind pt of technique Ball roll up wall into flex x 10, bil abd x 5 each returning therapist demo.  Bil UE abd in a "snow angel" x 5, 5 sec holds Manual Therapy P/ROM to Rt shoulder into flex, abd and D2 with scapular depression throughout by therapist and avoiding stretch to open wounds with blocking at pectoralis muscle STM during P/ROM to Rt pect insertion and lateral trunk where pt palpably tight staying above and lateral to bandage avoiding pull to open wounds Pt requested therapist to check her wounds as they are hard for her to see. Eraser size larger opening appears the same as when this therapist saw her last, unable to see clearly pinpoint opening without removing pts wound dressing completely so let this be.   12/13/23: Therapeutic Exercises Pulleys into flex and abd x 2 mins each; pt really liked these and reports would like to do them at home so showed her how to order these online Ball roll up wall into flex x 12, bil abd x 5 each returning therapist demo. Spent time educating pt about proper technique. Manual Therapy P/ROM to Rt shoulder into flex, abd and D2 with scapular depression throughout by therapist and avoiding stretch open wounds STM  during P/ROM to Rt pect insertion and lateral trunk where pt palpably tight but avoided open wounds; then briefly at end of session to Rt UT  12/04/23 Eval performed Education on HEP to start at home including UT stretch, scapular retractions, and posterior shoulder rolls.      PATIENT EDUCATION:  Education details:  per today's note Person educated: Patient Education method: Explanation, Demonstration, and Handouts Education comprehension: verbalized understanding and returned demonstration  HOME EXERCISE PROGRAM: Code not copied: scapular retractions, posterior shoulder rolls, Rt UT stretch    ASSESSMENT:  CLINICAL IMPRESSION:  Pts AROM has increased since evaluation session but still not back to baseline.  Added PROM to left shoulder.  No STM today with more exercise focus   OBJECTIVE IMPAIRMENTS: decreased knowledge of condition, decreased knowledge of use of DME, decreased ROM, and decreased strength.   ACTIVITY LIMITATIONS: carrying, lifting, and reach over head  PARTICIPATION LIMITATIONS: cleaning and community activity  PERSONAL FACTORS: Age, Fitness, Time since onset of injury/illness/exacerbation, and 1-2 comorbidities: radiation, SLNB  are also affecting patient's functional outcome.   REHAB POTENTIAL: Excellent  CLINICAL DECISION MAKING: Stable/uncomplicated  EVALUATION COMPLEXITY: Low  GOALS: Goals reviewed with patient? Yes  SHORT TERM GOALS=LTGs: Target date: 01/10/24  Pt will improve Rt shoulder AROM into flexion and abduction equal to the baseline measurements  Baseline: Goal status: INITIAL  2.  Pt will report being able to reach into her cabinet again with the help of her spatula which is normal for her Baseline:  Goal status: INITIAL  3.  Pt will improve Lt shoulder flexion and abduction to baseline measurements  Baseline:  Goal status: INITIAL  4.  Pt will be ind with final HEP Baseline:  Goal status: INITIAL  5.  Pt will be educated on ABC  video to watch for lymphedema education review Baseline:  Goal status: INITIAL   PLAN:  PT FREQUENCY: 1-2x/week  PT DURATION: 5 weeks   PLANNED INTERVENTIONS: [id number]- PT Re-evaluation, 97110-Therapeutic exercises, 97530- Therapeutic activity, 97112- Neuromuscular re-education, 97535- Self Care, [id number]- Manual therapy, Patient/Family education, Therapeutic exercises, Therapeutic activity, Neuromuscular re-education, Gait training, and Self Care  PLAN FOR NEXT SESSION: bil shoulder AAROM/PROM, include Rt UT work   Idamae Lusher, PT 01/02/2024, 7:15 PM

## 2024-01-03 ENCOUNTER — Encounter (HOSPITAL_BASED_OUTPATIENT_CLINIC_OR_DEPARTMENT_OTHER): Admitting: Internal Medicine

## 2024-01-03 DIAGNOSIS — L598 Other specified disorders of the skin and subcutaneous tissue related to radiation: Secondary | ICD-10-CM

## 2024-01-03 DIAGNOSIS — S21001A Unspecified open wound of right breast, initial encounter: Secondary | ICD-10-CM | POA: Diagnosis not present

## 2024-01-03 DIAGNOSIS — L98492 Non-pressure chronic ulcer of skin of other sites with fat layer exposed: Secondary | ICD-10-CM

## 2024-01-03 DIAGNOSIS — C50911 Malignant neoplasm of unspecified site of right female breast: Secondary | ICD-10-CM

## 2024-01-04 ENCOUNTER — Ambulatory Visit: Payer: HMO | Admitting: Rehabilitation

## 2024-01-08 ENCOUNTER — Ambulatory Visit: Payer: HMO

## 2024-01-08 DIAGNOSIS — Z483 Aftercare following surgery for neoplasm: Secondary | ICD-10-CM

## 2024-01-08 DIAGNOSIS — R293 Abnormal posture: Secondary | ICD-10-CM

## 2024-01-08 DIAGNOSIS — C50411 Malignant neoplasm of upper-outer quadrant of right female breast: Secondary | ICD-10-CM | POA: Diagnosis not present

## 2024-01-08 DIAGNOSIS — M25612 Stiffness of left shoulder, not elsewhere classified: Secondary | ICD-10-CM

## 2024-01-08 DIAGNOSIS — M25611 Stiffness of right shoulder, not elsewhere classified: Secondary | ICD-10-CM

## 2024-01-08 NOTE — Therapy (Signed)
 OUTPATIENT PHYSICAL THERAPY  UPPER EXTREMITY ONCOLOGY TREATMENT  Patient Name: Katie Jacobson MRN: [id number] DOB:12/26/1955, 68 y.o., female Today's Date: 01/08/2024  END OF SESSION:  PT End of Session - 01/08/24 1509     Visit Number 5    Number of Visits 11    Date for PT Re-Evaluation 01/10/24    Authorization Type none    PT Start Time 1506    PT Stop Time 1601    PT Time Calculation (min) 55 min    Activity Tolerance Patient tolerated treatment well    Behavior During Therapy [facility] for tasks assessed/performed              Past Medical History:  Diagnosis Date   Allergy    Arthritis    Breast cancer of upper-outer quadrant of right female breast (HCC) 11/14/2014   Treated with chemotherapy and radiation (diagnosed again in March 2024 in both breasts)   Heart murmur    History of chemotherapy    finished chemo 02/03/2015   History of kidney stones    History of seizures    as a child - unknown cause - states was never on anticonvulsants   Hypertension    states under control with meds., has been on med. x "years"   Mitral regurgitation    Mitral valve prolapse 1990   Echo 2023 with no MVP. Mild-mod aortic regurg and mild mitral regurg. 3 yr followup recommended.   Osteoporosis    Personal history of chemotherapy    Personal history of radiation therapy    Port-A-Cath in place 01/24/2023   Seasonal allergies    Past Surgical History:  Procedure Laterality Date   ABDOMINAL HYSTERECTOMY  1998   partial   APPENDECTOMY  1976   BREAST BIOPSY Right 09/30/2022   [place] RT BREAST BX W LOC DEV 1ST LESION IMG BX SPEC US GUIDE 09/30/2022 [facility]   BREAST BIOPSY Left 09/30/2022   [place] LT BREAST BX W LOC DEV 1ST LESION IMG BX SPEC US GUIDE 09/30/2022 [facility]   BREAST BIOPSY Right 09/30/2022   [place] RT BREAST BX W LOC DEV EA ADD LESION IMG BX SPEC US GUIDE 09/30/2022 [facility]   BREAST BIOPSY  12/14/2022   MM LT RADIOACTIVE SEED LOC MAMMO  GUIDE 12/14/2022 [facility]   BREAST LUMPECTOMY Right 02/2015   BREAST LUMPECTOMY WITH RADIOACTIVE SEED AND SENTINEL LYMPH NODE BIOPSY Left 12/15/2022   Procedure: LEFT BREAST LUMPECTOMY WITH RADIOACTIVE SEED AND AXILLARY SENTINEL LYMPH NODE BIOPSY;  Surgeon: Emelia Loron, MD;  Location: [facility] OR;  Service: General;  Laterality: Left;   COLONOSCOPY  09/30/2016   COMPLEX WOUND CLOSURE N/A 07/18/2023   Procedure: CLOSURE CHEST WALL WOUND X2;  Surgeon: Emelia Loron, MD;  Location: [facility] OR;  Service: General;  Laterality: N/A;   KNEE ARTHROSCOPY Left 03/26/2008   MASTECTOMY     MASTECTOMY W/ SENTINEL NODE BIOPSY Right 12/15/2022   Procedure: RIGHT MASTECTOMY WITH RIGHT AXILLARY SENTINEL LYMPH NODE BIOPSY;  Surgeon: Emelia Loron, MD;  Location: [facility] OR;  Service: General;  Laterality: Right;  180 MIN ROOM 9   PLANTAR'S WART EXCISION Left    x 2   PORT-A-CATH REMOVAL Left 04/06/2015   Procedure: REMOVAL PORT-A-CATH;  Surgeon: Emelia Loron, MD;  Location: [facility];  Service: General;  Laterality: Left;   PORT-A-CATH REMOVAL N/A 07/18/2023   Procedure: REMOVAL PORT-A-CATH;  Surgeon: Emelia Loron, MD;  Location: [facility] OR;  Service: General;  Laterality: N/A;  PORTACATH PLACEMENT N/A 11/27/2014   Procedure: INSERTION PORT-A-CATH;  Surgeon: Emelia Loron, MD;  Location: [facility];  Service: General;  Laterality: N/A;   PORTACATH PLACEMENT Right 12/15/2022   Procedure: INSERTION PORT-A-CATH;  Surgeon: Emelia Loron, MD;  Location: [facility] OR;  Service: General;  Laterality: Right;   RADIOACTIVE SEED GUIDED PARTIAL MASTECTOMY WITH AXILLARY SENTINEL LYMPH NODE BIOPSY Right 03/02/2015   Procedure: RADIOACTIVE SEED GUIDED RIGHT PARTIAL MASTECTOMY WITH RIGHT AXILLARY SENTINEL LYMPH NODE BIOPSY;  Surgeon: Emelia Loron, MD;  Location: [facility];  Service: General;  Laterality: Right;   SALIVARY STONE REMOVAL Right 1972    TONSILLECTOMY  1970s   TRANSTHORACIC ECHOCARDIOGRAM  01/16/2020   EF 60 to 65%.  GRII DD.  No R WMA.  Normal RV size and function.  Rheumatic mitral valve with moderate thickening-hockey-stick appearing.  Moderate MR with mild MS.  Moderate LA dilation.  (Recommend follow-up in 2 to 3 years)   TRANSTHORACIC ECHOCARDIOGRAM  05/31/2022   Normal LV size and function-EF 60 to 65%.  No RWMA. ??  Normal diastolic parameters but elevated LAP?  Normal RV with normal RVP and RAP.  Manage mitral valve with mild MR and no MS.  Mild to moderate AI with aortic sclerosis but no stenosis.   TUBAL LIGATION  1996   Patient Active Problem List   Diagnosis Date Noted   Breast cancer (HCC) 12/15/2022   Bilateral malignant neoplasm of breast in female [facility]) 11/02/2022   Allergic reaction to chemical substance 01/30/2022   Preventative health care 01/28/2022   Cervical radiculopathy 04/20/2021   Primary osteoarthritis of left knee 03/09/2021   Vitamin D deficiency 07/29/2019   Smoking greater than 40 pack years 07/27/2019   Class 3 severe obesity due to excess calories without serious comorbidity in adult [facility]) 07/26/2019   Genetic testing 01/25/2016   Microscopic hematuria 09/06/2015   Vaginal dryness 12/09/2014   Family history of breast cancer    Malignant neoplasm of upper-outer quadrant of right breast in female, estrogen receptor negative (HCC) 11/14/2014   Intermittent palpitations    Obesity (BMI 35.0-39.9 without comorbidity)    Hyperlipidemia LDL goal <100 07/07/2011   History of partial seizures 04/01/2011   Asthma 04/01/2011   Hot flashes 04/01/2011   Tobacco abuse 04/01/2011   Essential hypertension 04/01/2011   Mitral regurgitation due to cusp prolapse 04/01/2011    PCP: [name], NP  REFERRING PROVIDER: Emelia Loron, MD  REFERRING DIAG: Rt breast cancer with mastectomy   THERAPY DIAG:  Malignant neoplasm of upper-outer quadrant of right breast in female, estrogen  receptor negative (HCC)  Stiffness of right shoulder, not elsewhere classified  Stiffness of left shoulder, not elsewhere classified  Abnormal posture  Aftercare following surgery for neoplasm  ONSET DATE: 12/15/22  Rationale for Evaluation and Treatment: Rehabilitation  SUBJECTIVE:  SUBJECTIVE STATEMENT:  I saw Dr Mikey Bussing again since I was here last and I did not have as a good experience as I did the first time. The doctor was rude and kinda rushed through the appt once she realized I didn't want to start the hyperbaric chamber is how it felt to me. I did get to see the chamber and I just really don't want to do that treatment right now. So the plan is for me to keep packing the wound with what they gave me and she'll see me back again in 3 weeks. Also I am just really tired today because I've been cleaning my house and probably did too much. I know I need to slow down. My Rt UT and lateral trunk feels really tight today.  PERTINENT HISTORY: Pt. Underwent a Right lumpectomy for Gr. 1 IDC on 03/02/2015 with 0/4 LN's. She had neoadjuvant chemo and radiation. In November of 2023 she noted breast asymmetry and patient was diagnosed on 10/07/2022 with right and left breast grade 2 IDC. It measures .8 cm on the left and a It is Triple Negative with a Ki67 of 95% . Right breast mass is 1.7 cm and 1 cm and Ki 67 of 50%. She had a right mastectomy and left lumpectomy with SLNB on 12/15/2022. Completed chemotherapy and then bil chest wall radiation ending 06/09/23. port removal 07/18/23 with need for re-excision and packing with infection. 2 poor healing wounds on mastectomy incision chronically - has a referral to hyperbaric O2.   PAIN:  Are you having pain? Yes  4/10 intermittent pain at the wounds   PRECAUTIONS: bil  lymphedema risk   RED FLAGS: None   WEIGHT BEARING RESTRICTIONS: No  FALLS:  Has patient fallen in last 6 months? No  LIVING ENVIRONMENT: Lives with: lives with their family and lives with their spouse  OCCUPATION: not working   LEISURE: nothing really.  I would like this to heal so I can exercise more.    HAND DOMINANCE: can use both   PRIOR LEVEL OF FUNCTION: Independent  PATIENT GOALS: improve motion on both arms    OBJECTIVE: Note: Objective measures were completed at Evaluation unless otherwise noted.  COGNITION: Overall cognitive status: Within functional limits for tasks assessed   PALPATION: +2 ttp Rt UT with more tightness here compared to the Lt side  OBSERVATIONS / OTHER ASSESSMENTS: wearing compression.  Incision healed except for 2 small erasure sized spots that pt has covered with a bandage.  The lateral incision almost folds over on itself.  Did not uncover bandage today.    POSTURE: guarded   UPPER EXTREMITY AROM/PROM:        A/PROM RIGHT   eval 11/28/2022 12/04/23 01/02/24  Shoulder extension 63 45 - pain at the Rt pouch on the chest 55  Shoulder flexion 140 100 - pulling in the Rt trunk/ 110 pn 120 - pulling   Shoulder abduction 135 110 - it made the whole arm hurt / 145 pn 120 - tight   Shoulder internal rotation 65    Shoulder external rotation 84 70 / 65 85                          (Blank rows = not tested)   A/PROM LEFT   Eval 11/28/22 12/04/23 01/02/24  Shoulder extension 63 60   Shoulder flexion 165 140 / 170 145  Shoulder abduction 162 140 / 170 145  Shoulder internal  rotation 73    Shoulder external rotation 87 80 / 85                           (Blank rows = not tested)   UPPER EXTREMITY STRENGTH:   LYMPHEDEMA ASSESSMENTS:    LANDMARK RIGHT   baseline  10 cm proximal to olecranon process 38.2  Olecranon process 28.5  10 cm proximal to ulnar styloid process 26.4  Just proximal to ulnar styloid process 18.05  Across hand at thumb  web space 20.1  At base of 2nd digit 6.0  (Blank rows = not tested)   LANDMARK LEFT   baseline  10 cm proximal to olecranon process 38.4  Olecranon process 27.9  10 cm proximal to ulnar styloid process 25.1  Just proximal to ulnar styloid process 17.4  Across hand at thumb web space 19.8  At base of 2nd digit 5.6  (Blank rows = not tested)  QUICK DASH SURVEY: 43% from 34%                                                                                                                           TREATMENT DATE:  01/08/24: Manual Therapy P/ROM to Rt shoulder into flex, abd and D2 with scapular depression throughout by therapist and avoiding stretch to open wounds with blocking at pectoralis muscle STM during P/ROM to Rt pect insertion and lateral trunk where pt palpably very tight staying above and lateral to bandage avoiding pull to open wounds. Then into Lt S/L for cont'd STM to Rt medial scapular border, lateral trunk and UT. Pt was very tight at her UT and reports this felt much improved by end of session.   01/02/24: Remeasured AROM and discussed pain Therapeutic Exercises Pulleys into flex and abd x 2:30-3 mins each, VC's to remind pt of technique. Used step for more comfortable seated positioning.  Ball roll up wall into flex x 10, bil abd x 5 each returning therapist demo.  Row yellow x 10  Supine chest stretch x 60"  Bil UE abd in a "snow angel" x 5, 5 sec holds Bil shoulder flexion with dowel with with pt getting "stuck" with it overhead so stopped after two  Switched to standing AAROM with dowel flexion in a canoe rowing motion x 5  Manual Therapy P/ROM to bil shoulders into flex, abd and D2 with scapular depression throughout by therapist   12/27/23: Therapeutic Exercises Pulleys into flex and abd x 2:30-3 mins each, VC's to remind pt of technique Ball roll up wall into flex x 10, bil abd x 5 each returning therapist demo.  Bil UE abd in a "snow angel" x 5, 5 sec  holds Manual Therapy P/ROM to Rt shoulder into flex, abd and D2 with scapular depression throughout by therapist and avoiding stretch to open wounds with blocking at pectoralis muscle STM during P/ROM to Rt pect insertion and lateral trunk where  pt palpably tight staying above and lateral to bandage avoiding pull to open wounds Pt requested therapist to check her wounds as they are hard for her to see. Eraser size larger opening appears the same as when this therapist saw her last, unable to see clearly pinpoint opening without removing pts wound dressing completely so let this be.   12/13/23: Therapeutic Exercises Pulleys into flex and abd x 2 mins each; pt really liked these and reports would like to do them at home so showed her how to order these online Ball roll up wall into flex x 12, bil abd x 5 each returning therapist demo. Spent time educating pt about proper technique. Manual Therapy P/ROM to Rt shoulder into flex, abd and D2 with scapular depression throughout by therapist and avoiding stretch open wounds STM during P/ROM to Rt pect insertion and lateral trunk where pt palpably tight but avoided open wounds; then briefly at end of session to Rt UT  12/04/23 Eval performed Education on HEP to start at home including UT stretch, scapular retractions, and posterior shoulder rolls.      PATIENT EDUCATION:  Education details: per today's note Person educated: Patient Education method: Programmer, multimedia, Facilities manager, and Handouts Education comprehension: verbalized understanding and returned demonstration  HOME EXERCISE PROGRAM: Code not copied: scapular retractions, posterior shoulder rolls, Rt UT stretch    ASSESSMENT:  CLINICAL IMPRESSION:  Pt reports feeling very fatigued today as she has been busy cleaning her house so focused on manual therapy today. She was very tight at her Rt lateral trunk that was limiting her end Rt shoulder A/ROM. She reports was cleaning behind her toilet  and really reaching  and stretching and feels like this may have been too much. Encouraged pt to be more mindful of the fact that she is healing and to rest when she feels fatigued and she repos she is going to try to be better about this. She reports her tightness much improved at end of session as well as her Rt shoulder A/ROM.    OBJECTIVE IMPAIRMENTS: decreased knowledge of condition, decreased knowledge of use of DME, decreased ROM, and decreased strength.   ACTIVITY LIMITATIONS: carrying, lifting, and reach over head  PARTICIPATION LIMITATIONS: cleaning and community activity  PERSONAL FACTORS: Age, Fitness, Time since onset of injury/illness/exacerbation, and 1-2 comorbidities: radiation, SLNB  are also affecting patient's functional outcome.   REHAB POTENTIAL: Excellent  CLINICAL DECISION MAKING: Stable/uncomplicated  EVALUATION COMPLEXITY: Low  GOALS: Goals reviewed with patient? Yes  SHORT TERM GOALS=LTGs: Target date: 01/10/24  Pt will improve Rt shoulder AROM into flexion and abduction equal to the baseline measurements  Baseline: Goal status: INITIAL  2.  Pt will report being able to reach into her cabinet again with the help of her spatula which is normal for her Baseline:  Goal status: INITIAL  3.  Pt will improve Lt shoulder flexion and abduction to baseline measurements  Baseline:  Goal status: INITIAL  4.  Pt will be ind with final HEP Baseline:  Goal status: INITIAL  5.  Pt will be educated on ABC video to watch for lymphedema education review Baseline:  Goal status: INITIAL   PLAN:  PT FREQUENCY: 1-2x/week  PT DURATION: 5 weeks   PLANNED INTERVENTIONS: 97164- PT Re-evaluation, 97110-Therapeutic exercises, 97530- Therapeutic activity, 97112- Neuromuscular re-education, 97535- Self Care, 16109- Manual therapy, Patient/Family education, Therapeutic exercises, Therapeutic activity, Neuromuscular re-education, Gait training, and Self Care  PLAN FOR NEXT  SESSION: Rt shoulder AAROM/PROM, include Rt UT  work , renew vs D/C next session?   Hermenia Bers, PTA 01/08/2024, 4:24 PM

## 2024-01-11 ENCOUNTER — Ambulatory Visit: Payer: HMO | Admitting: Rehabilitation

## 2024-01-15 ENCOUNTER — Other Ambulatory Visit: Payer: Self-pay | Admitting: Family

## 2024-01-16 ENCOUNTER — Ambulatory Visit: Attending: General Surgery

## 2024-01-16 DIAGNOSIS — Z171 Estrogen receptor negative status [ER-]: Secondary | ICD-10-CM | POA: Insufficient documentation

## 2024-01-16 DIAGNOSIS — Z483 Aftercare following surgery for neoplasm: Secondary | ICD-10-CM | POA: Diagnosis not present

## 2024-01-16 DIAGNOSIS — M25611 Stiffness of right shoulder, not elsewhere classified: Secondary | ICD-10-CM | POA: Diagnosis not present

## 2024-01-16 DIAGNOSIS — M25612 Stiffness of left shoulder, not elsewhere classified: Secondary | ICD-10-CM | POA: Insufficient documentation

## 2024-01-16 DIAGNOSIS — R293 Abnormal posture: Secondary | ICD-10-CM | POA: Insufficient documentation

## 2024-01-16 DIAGNOSIS — C50411 Malignant neoplasm of upper-outer quadrant of right female breast: Secondary | ICD-10-CM | POA: Diagnosis not present

## 2024-01-16 NOTE — Therapy (Signed)
 OUTPATIENT PHYSICAL THERAPY  UPPER EXTREMITY ONCOLOGY TREATMENT  Patient Name: Katie Jacobson MRN: [id number] DOB:[birth year], 68 y.o., female Today's Date: 01/16/2024  END OF SESSION:  PT End of Session - 01/16/24 1309     Visit Number 6    Number of Visits 17    Date for PT Re-Evaluation 02/27/24    PT Start Time 1304    PT Stop Time 1359    PT Time Calculation (min) 55 min    Activity Tolerance Patient tolerated treatment well    Behavior During Therapy [facility] for tasks assessed/performed              Past Medical History:  Diagnosis Date   Allergy    Arthritis    Breast cancer of upper-outer quadrant of right female breast (HCC) 11/14/2014   Treated with chemotherapy and radiation (diagnosed again in March 2024 in both breasts)   Heart murmur    History of chemotherapy    finished chemo 02/03/2015   History of kidney stones    History of seizures    as a child - unknown cause - states was never on anticonvulsants   Hypertension    states under control with meds., has been on med. x "years"   Mitral regurgitation    Mitral valve prolapse 1990   Echo 2023 with no MVP. Mild-mod aortic regurg and mild mitral regurg. 3 yr followup recommended.   Osteoporosis    Personal history of chemotherapy    Personal history of radiation therapy    Port-A-Cath in place 01/24/2023   Seasonal allergies    Past Surgical History:  Procedure Laterality Date   ABDOMINAL HYSTERECTOMY  1998   partial   APPENDECTOMY  1976   BREAST BIOPSY Right 09/30/2022   [place] RT BREAST BX W LOC DEV 1ST LESION IMG BX SPEC US GUIDE 09/30/2022 [facility]   BREAST BIOPSY Left 09/30/2022   [place] LT BREAST BX W LOC DEV 1ST LESION IMG BX SPEC US GUIDE 09/30/2022 [facility]   BREAST BIOPSY Right 09/30/2022   [place] RT BREAST BX W LOC DEV EA ADD LESION IMG BX SPEC US GUIDE 09/30/2022 [facility]   BREAST BIOPSY  12/14/2022   MM LT RADIOACTIVE SEED LOC MAMMO GUIDE 12/14/2022 [facility]    BREAST LUMPECTOMY Right 02/2015   BREAST LUMPECTOMY WITH RADIOACTIVE SEED AND SENTINEL LYMPH NODE BIOPSY Left 12/15/2022   Procedure: LEFT BREAST LUMPECTOMY WITH RADIOACTIVE SEED AND AXILLARY SENTINEL LYMPH NODE BIOPSY;  Surgeon: Emelia Loron, MD;  Location: [facility] OR;  Service: General;  Laterality: Left;   COLONOSCOPY  09/30/2016   COMPLEX WOUND CLOSURE N/A 07/18/2023   Procedure: CLOSURE CHEST WALL WOUND X2;  Surgeon: Emelia Loron, MD;  Location: [facility] OR;  Service: General;  Laterality: N/A;   KNEE ARTHROSCOPY Left 03/26/2008   MASTECTOMY     MASTECTOMY W/ SENTINEL NODE BIOPSY Right 12/15/2022   Procedure: RIGHT MASTECTOMY WITH RIGHT AXILLARY SENTINEL LYMPH NODE BIOPSY;  Surgeon: Emelia Loron, MD;  Location: [facility] OR;  Service: General;  Laterality: Right;  180 MIN ROOM 9   PLANTAR'S WART EXCISION Left    x 2   PORT-A-CATH REMOVAL Left 04/06/2015   Procedure: REMOVAL PORT-A-CATH;  Surgeon: Emelia Loron, MD;  Location: [facility];  Service: General;  Laterality: Left;   PORT-A-CATH REMOVAL N/A 07/18/2023   Procedure: REMOVAL PORT-A-CATH;  Surgeon: Emelia Loron, MD;  Location: [facility] OR;  Service: General;  Laterality: N/A;   PORTACATH PLACEMENT N/A 11/27/2014  Procedure: INSERTION PORT-A-CATH;  Surgeon: Emelia Loron, MD;  Location: [facility];  Service: General;  Laterality: N/A;   PORTACATH PLACEMENT Right 12/15/2022   Procedure: INSERTION PORT-A-CATH;  Surgeon: Emelia Loron, MD;  Location: [facility] OR;  Service: General;  Laterality: Right;   RADIOACTIVE SEED GUIDED PARTIAL MASTECTOMY WITH AXILLARY SENTINEL LYMPH NODE BIOPSY Right 03/02/2015   Procedure: RADIOACTIVE SEED GUIDED RIGHT PARTIAL MASTECTOMY WITH RIGHT AXILLARY SENTINEL LYMPH NODE BIOPSY;  Surgeon: Emelia Loron, MD;  Location: [facility];  Service: General;  Laterality: Right;   SALIVARY STONE REMOVAL Right 1972   TONSILLECTOMY  1970s   TRANSTHORACIC  ECHOCARDIOGRAM  01/16/2020   EF 60 to 65%.  GRII DD.  No R WMA.  Normal RV size and function.  Rheumatic mitral valve with moderate thickening-hockey-stick appearing.  Moderate MR with mild MS.  Moderate LA dilation.  (Recommend follow-up in 2 to 3 years)   TRANSTHORACIC ECHOCARDIOGRAM  05/31/2022   Normal LV size and function-EF 60 to 65%.  No RWMA. ??  Normal diastolic parameters but elevated LAP?  Normal RV with normal RVP and RAP.  Manage mitral valve with mild MR and no MS.  Mild to moderate AI with aortic sclerosis but no stenosis.   TUBAL LIGATION  1996   Patient Active Problem List   Diagnosis Date Noted   Breast cancer (HCC) 12/15/2022   Bilateral malignant neoplasm of breast in female [facility]) 11/02/2022   Allergic reaction to chemical substance 01/30/2022   Preventative health care 01/28/2022   Cervical radiculopathy 04/20/2021   Primary osteoarthritis of left knee 03/09/2021   Vitamin D deficiency 07/29/2019   Smoking greater than 40 pack years 07/27/2019   Class 3 severe obesity due to excess calories without serious comorbidity in adult [facility]) 07/26/2019   Genetic testing 01/25/2016   Microscopic hematuria 09/06/2015   Vaginal dryness 12/09/2014   Family history of breast cancer    Malignant neoplasm of upper-outer quadrant of right breast in female, estrogen receptor negative (HCC) 11/14/2014   Intermittent palpitations    Obesity (BMI 35.0-39.9 without comorbidity)    Hyperlipidemia LDL goal <100 07/07/2011   History of partial seizures 04/01/2011   Asthma 04/01/2011   Hot flashes 04/01/2011   Tobacco abuse 04/01/2011   Essential hypertension 04/01/2011   Mitral regurgitation due to cusp prolapse 04/01/2011    PCP: [name], NP  REFERRING PROVIDER: Emelia Loron, MD  REFERRING DIAG: Rt breast cancer with mastectomy   THERAPY DIAG:  Malignant neoplasm of upper-outer quadrant of right breast in female, estrogen receptor negative (HCC)  Stiffness of  right shoulder, not elsewhere classified  Stiffness of left shoulder, not elsewhere classified  Abnormal posture  Aftercare following surgery for neoplasm  ONSET DATE: 12/15/22  Rationale for Evaluation and Treatment: Rehabilitation  SUBJECTIVE:  SUBJECTIVE STATEMENT:  Overall I'm doing better. I want to keep coming but will be 1x/wk. My Rt shoulder is better but not all the way.   PERTINENT HISTORY: Pt. Underwent a Right lumpectomy for Gr. 1 IDC on 03/02/2015 with 0/4 LN's. She had neoadjuvant chemo and radiation. In November of 2023 she noted breast asymmetry and patient was diagnosed on 10/07/2022 with right and left breast grade 2 IDC. It measures .8 cm on the left and a It is Triple Negative with a Ki67 of 95% . Right breast mass is 1.7 cm and 1 cm and Ki 67 of 50%. She had a right mastectomy and left lumpectomy with SLNB on 12/15/2022. Completed chemotherapy and then bil chest wall radiation ending 06/09/23. port removal 07/18/23 with need for re-excision and packing with infection. 2 poor healing wounds on mastectomy incision chronically - has a referral to hyperbaric O2.   PAIN:  Are you having pain?No  PRECAUTIONS: bil lymphedema risk   RED FLAGS: None   WEIGHT BEARING RESTRICTIONS: No  FALLS:  Has patient fallen in last 6 months? No  LIVING ENVIRONMENT: Lives with: lives with their family and lives with their spouse  OCCUPATION: not working   LEISURE: nothing really.  I would like this to heal so I can exercise more.    HAND DOMINANCE: can use both   PRIOR LEVEL OF FUNCTION: Independent  PATIENT GOALS: improve motion on both arms    OBJECTIVE: Note: Objective measures were completed at Evaluation unless otherwise noted.  COGNITION: Overall cognitive status: Within functional limits  for tasks assessed   PALPATION: +2 ttp Rt UT with more tightness here compared to the Lt side  OBSERVATIONS / OTHER ASSESSMENTS: wearing compression.  Incision healed except for 2 small erasure sized spots that pt has covered with a bandage.  The lateral incision almost folds over on itself.  Did not uncover bandage today.    POSTURE: guarded   UPPER EXTREMITY AROM/PROM:       01/16/24  A/PROM RIGHT   eval 11/28/2022 12/04/23 01/02/24 Right  Shoulder extension 63 45 - pain at the Rt pouch on the chest 55 53  Shoulder flexion 140 100 - pulling in the Rt trunk/ 110 pn 120 - pulling  138  Shoulder abduction 135 110 - it made the whole arm hurt / 145 pn 120 - tight  131  Shoulder internal rotation 65     Shoulder external rotation 84 70 / 65 85                           (Blank rows = not tested)   A/PROM LEFT   Eval 11/28/22 12/04/23 01/02/24 01/16/24  Shoulder extension 63 60    Shoulder flexion 165 140 / 170 145 154  Shoulder abduction 162 140 / 170 145 161  Shoulder internal rotation 73     Shoulder external rotation 87 80 / 85                            (Blank rows = not tested)   UPPER EXTREMITY STRENGTH:   LYMPHEDEMA ASSESSMENTS:    LANDMARK RIGHT   baseline  10 cm proximal to olecranon process 38.2  Olecranon process 28.5  10 cm proximal to ulnar styloid process 26.4  Just proximal to ulnar styloid process 18.05  Across hand at thumb web space 20.1  At base of 2nd digit  6.0  (Blank rows = not tested)   LANDMARK LEFT   baseline  10 cm proximal to olecranon process 38.4  Olecranon process 27.9  10 cm proximal to ulnar styloid process 25.1  Just proximal to ulnar styloid process 17.4  Across hand at thumb web space 19.8  At base of 2nd digit 5.6  (Blank rows = not tested)  QUICK DASH SURVEY: 43% from 34%                                                                                                                           TREATMENT DATE:  01/16/24: Therapeutic  Exercises Pulleys into flex and abd x 2 mins reminding pt to decrease Rt scapular compensation Roll yellow ball up wall into flex and Rt abd x 10 each Supine over half foam roll for following: Bil horz abd x 10, trial of bil UE scaption into a "V" but painful in t anterior shoulder so stopped, then bil UE abd in a "snow angel" x 10 with 5 sec holds returning therapist demo for each Manual Therapy P/ROM to Rt shoulder into flex, abd and D2 with scapular depression throughout by therapist and avoiding stretch to open wounds with blocking at pectoralis muscle STM during P/ROM to Rt pect insertion and lateral trunk where pt palpably very tight staying above and lateral to bandage avoiding pull to open wounds.   01/08/24: Manual Therapy P/ROM to Rt shoulder into flex, abd and D2 with scapular depression throughout by therapist and avoiding stretch to open wounds with blocking at pectoralis muscle STM during P/ROM to Rt pect insertion and lateral trunk where pt palpably very tight staying above and lateral to bandage avoiding pull to open wounds. Then into Lt S/L for cont'd STM to Rt medial scapular border, lateral trunk and UT. Pt was very tight at her UT and reports this felt much improved by end of session.   01/02/24: Remeasured AROM and discussed pain Therapeutic Exercises Pulleys into flex and abd x 2:30-3 mins each, VC's to remind pt of technique. Used step for more comfortable seated positioning.  Ball roll up wall into flex x 10, bil abd x 5 each returning therapist demo.  Row yellow x 10  Supine chest stretch x 60"  Bil UE abd in a "snow angel" x 5, 5 sec holds Bil shoulder flexion with dowel with with pt getting "stuck" with it overhead so stopped after two  Switched to standing AAROM with dowel flexion in a canoe rowing motion x 5  Manual Therapy P/ROM to bil shoulders into flex, abd and D2 with scapular depression throughout by therapist   12/27/23: Therapeutic Exercises Pulleys into  flex and abd x 2:30-3 mins each, VC's to remind pt of technique Ball roll up wall into flex x 10, bil abd x 5 each returning therapist demo.  Bil UE abd in a "snow angel" x 5, 5 sec holds Manual Therapy P/ROM to Rt shoulder into flex,  abd and D2 with scapular depression throughout by therapist and avoiding stretch to open wounds with blocking at pectoralis muscle STM during P/ROM to Rt pect insertion and lateral trunk where pt palpably tight staying above and lateral to bandage avoiding pull to open wounds Pt requested therapist to check her wounds as they are hard for her to see. Eraser size larger opening appears the same as when this therapist saw her last, unable to see clearly pinpoint opening without removing pts wound dressing completely so let this be.      PATIENT EDUCATION:  Education details: per today's note Person educated: Patient Education method: Programmer, multimedia, Demonstration, and Handouts Education comprehension: verbalized understanding and returned demonstration  HOME EXERCISE PROGRAM: Code not copied: scapular retractions, posterior shoulder rolls, Rt UT stretch    ASSESSMENT:  CLINICAL IMPRESSION:  Renewal done today to cont 1x/wk x up to 6 more weeks. Progressed pt to include A/ROM stretches over half foam roll. Pt reports good stretches with these but pain with scaption so did not do this one. Her Rt lateral trunk is still tight and limiting but this has improved since start of care.    OBJECTIVE IMPAIRMENTS: decreased knowledge of condition, decreased knowledge of use of DME, decreased ROM, and decreased strength.   ACTIVITY LIMITATIONS: carrying, lifting, and reach over head  PARTICIPATION LIMITATIONS: cleaning and community activity  PERSONAL FACTORS: Age, Fitness, Time since onset of injury/illness/exacerbation, and 1-2 comorbidities: radiation, SLNB  are also affecting patient's functional outcome.   REHAB POTENTIAL: Excellent  CLINICAL DECISION MAKING:  Stable/uncomplicated  EVALUATION COMPLEXITY: Low  GOALS: Goals reviewed with patient? Yes  SHORT TERM GOALS=LTGs: Target date: 02/27/24  Pt will improve Rt shoulder AROM into flexion and abduction equal to the baseline measurements  Baseline: Goal status: ONGOING, see flowsheet  2.  Pt will report being able to reach into her cabinet again with the help of her spatula which is normal for her Baseline: 01/16/24 - can do this but still limited at end reach Goal status: PARTIALLY MET  3.  Pt will improve Lt shoulder flexion and abduction to baseline measurements  Baseline:  Goal status: PARTIALLY MET, see flowsheet  4.  Pt will be ind with final HEP Baseline:  Goal status: ONGOING  5.  Pt will be educated on ABC video to watch for lymphedema education review Baseline:  Goal status: MET   PLAN:  PT FREQUENCY: 1-2x/week  PT DURATION: 6 weeks   PLANNED INTERVENTIONS: 97164- PT Re-evaluation, 97110-Therapeutic exercises, 97530- Therapeutic activity, 97112- Neuromuscular re-education, 97535- Self Care, 16109- Manual therapy, Patient/Family education, Therapeutic exercises, Therapeutic activity, Neuromuscular re-education, Gait training, and Self Care  PLAN FOR NEXT SESSION: Renew this session; cont Rt shoulder AAROM/PROM, include Rt UT work  Hermenia Bers, PTA 01/16/2024, 2:04 PM

## 2024-01-22 NOTE — Addendum Note (Signed)
 Addended by: Judy Null R on: 01/22/2024 10:21 AM   Modules accepted: Orders

## 2024-01-24 ENCOUNTER — Ambulatory Visit

## 2024-01-24 DIAGNOSIS — R293 Abnormal posture: Secondary | ICD-10-CM

## 2024-01-24 DIAGNOSIS — M25611 Stiffness of right shoulder, not elsewhere classified: Secondary | ICD-10-CM

## 2024-01-24 DIAGNOSIS — M25612 Stiffness of left shoulder, not elsewhere classified: Secondary | ICD-10-CM

## 2024-01-24 DIAGNOSIS — Z483 Aftercare following surgery for neoplasm: Secondary | ICD-10-CM

## 2024-01-24 DIAGNOSIS — C50411 Malignant neoplasm of upper-outer quadrant of right female breast: Secondary | ICD-10-CM | POA: Diagnosis not present

## 2024-01-24 DIAGNOSIS — Z171 Estrogen receptor negative status [ER-]: Secondary | ICD-10-CM

## 2024-01-24 NOTE — Therapy (Signed)
 OUTPATIENT PHYSICAL THERAPY  UPPER EXTREMITY ONCOLOGY TREATMENT  Patient Name: Katie Jacobson MRN: [id number] DOB:[birth year], 68 y.o., female Today's Date: 01/24/2024  END OF SESSION:  PT End of Session - 01/24/24 1453     Visit Number 7    Number of Visits 17    Date for PT Re-Evaluation 02/27/24    Authorization Type none    PT Start Time 1451    PT Stop Time 1547    PT Time Calculation (min) 56 min    Activity Tolerance Patient tolerated treatment well    Behavior During Therapy [facility] for tasks assessed/performed              Past Medical History:  Diagnosis Date   Allergy    Arthritis    Breast cancer of upper-outer quadrant of right female breast (HCC) 11/14/2014   Treated with chemotherapy and radiation (diagnosed again in March 2024 in both breasts)   Heart murmur    History of chemotherapy    finished chemo 02/03/2015   History of kidney stones    History of seizures    as a child - unknown cause - states was never on anticonvulsants   Hypertension    states under control with meds., has been on med. x "years"   Mitral regurgitation    Mitral valve prolapse 1990   Echo 2023 with no MVP. Mild-mod aortic regurg and mild mitral regurg. 3 yr followup recommended.   Osteoporosis    Personal history of chemotherapy    Personal history of radiation therapy    Port-A-Cath in place 01/24/2023   Seasonal allergies    Past Surgical History:  Procedure Laterality Date   ABDOMINAL HYSTERECTOMY  1998   partial   APPENDECTOMY  1976   BREAST BIOPSY Right 09/30/2022   [place] RT BREAST BX W LOC DEV 1ST LESION IMG BX SPEC US GUIDE 09/30/2022 [facility]   BREAST BIOPSY Left 09/30/2022   [place] LT BREAST BX W LOC DEV 1ST LESION IMG BX SPEC US GUIDE 09/30/2022 [facility]   BREAST BIOPSY Right 09/30/2022   [place] RT BREAST BX W LOC DEV EA ADD LESION IMG BX SPEC US GUIDE 09/30/2022 [facility]   BREAST BIOPSY  12/14/2022   MM LT RADIOACTIVE SEED LOC MAMMO  GUIDE 12/14/2022 [facility]   BREAST LUMPECTOMY Right 02/2015   BREAST LUMPECTOMY WITH RADIOACTIVE SEED AND SENTINEL LYMPH NODE BIOPSY Left 12/15/2022   Procedure: LEFT BREAST LUMPECTOMY WITH RADIOACTIVE SEED AND AXILLARY SENTINEL LYMPH NODE BIOPSY;  Surgeon: Emelia Loron, MD;  Location: [facility] OR;  Service: General;  Laterality: Left;   COLONOSCOPY  09/30/2016   COMPLEX WOUND CLOSURE N/A 07/18/2023   Procedure: CLOSURE CHEST WALL WOUND X2;  Surgeon: Emelia Loron, MD;  Location: [facility] OR;  Service: General;  Laterality: N/A;   KNEE ARTHROSCOPY Left 03/26/2008   MASTECTOMY     MASTECTOMY W/ SENTINEL NODE BIOPSY Right 12/15/2022   Procedure: RIGHT MASTECTOMY WITH RIGHT AXILLARY SENTINEL LYMPH NODE BIOPSY;  Surgeon: Emelia Loron, MD;  Location: [facility] OR;  Service: General;  Laterality: Right;  180 MIN ROOM 9   PLANTAR'S WART EXCISION Left    x 2   PORT-A-CATH REMOVAL Left 04/06/2015   Procedure: REMOVAL PORT-A-CATH;  Surgeon: Emelia Loron, MD;  Location: [facility];  Service: General;  Laterality: Left;   PORT-A-CATH REMOVAL N/A 07/18/2023   Procedure: REMOVAL PORT-A-CATH;  Surgeon: Emelia Loron, MD;  Location: [facility] OR;  Service: General;  Laterality: N/A;  PORTACATH PLACEMENT N/A 11/27/2014   Procedure: INSERTION PORT-A-CATH;  Surgeon: Enid Harry, MD;  Location: [facility];  Service: General;  Laterality: N/A;   PORTACATH PLACEMENT Right 12/15/2022   Procedure: INSERTION PORT-A-CATH;  Surgeon: Enid Harry, MD;  Location: [facility] OR;  Service: General;  Laterality: Right;   RADIOACTIVE SEED GUIDED PARTIAL MASTECTOMY WITH AXILLARY SENTINEL LYMPH NODE BIOPSY Right 03/02/2015   Procedure: RADIOACTIVE SEED GUIDED RIGHT PARTIAL MASTECTOMY WITH RIGHT AXILLARY SENTINEL LYMPH NODE BIOPSY;  Surgeon: Enid Harry, MD;  Location: [facility];  Service: General;  Laterality: Right;   SALIVARY STONE REMOVAL Right 1972    TONSILLECTOMY  1970s   TRANSTHORACIC ECHOCARDIOGRAM  01/16/2020   EF 60 to 65%.  GRII DD.  No R WMA.  Normal RV size and function.  Rheumatic mitral valve with moderate thickening-hockey-stick appearing.  Moderate MR with mild MS.  Moderate LA dilation.  (Recommend follow-up in 2 to 3 years)   TRANSTHORACIC ECHOCARDIOGRAM  05/31/2022   Normal LV size and function-EF 60 to 65%.  No RWMA. ??  Normal diastolic parameters but elevated LAP?  Normal RV with normal RVP and RAP.  Manage mitral valve with mild MR and no MS.  Mild to moderate AI with aortic sclerosis but no stenosis.   TUBAL LIGATION  1996   Patient Active Problem List   Diagnosis Date Noted   Breast cancer (HCC) 12/15/2022   Bilateral malignant neoplasm of breast in female [facility]) 11/02/2022   Allergic reaction to chemical substance 01/30/2022   Preventative health care 01/28/2022   Cervical radiculopathy 04/20/2021   Primary osteoarthritis of left knee 03/09/2021   Vitamin D deficiency 07/29/2019   Smoking greater than 40 pack years 07/27/2019   Class 3 severe obesity due to excess calories without serious comorbidity in adult [facility]) 07/26/2019   Genetic testing 01/25/2016   Microscopic hematuria 09/06/2015   Vaginal dryness 12/09/2014   Family history of breast cancer    Malignant neoplasm of upper-outer quadrant of right breast in female, estrogen receptor negative (HCC) 11/14/2014   Intermittent palpitations    Obesity (BMI 35.0-39.9 without comorbidity)    Hyperlipidemia LDL goal <100 07/07/2011   History of partial seizures 04/01/2011   Asthma 04/01/2011   Hot flashes 04/01/2011   Tobacco abuse 04/01/2011   Essential hypertension 04/01/2011   Mitral regurgitation due to cusp prolapse 04/01/2011    PCP: Dorrene Gaucher, NP  REFERRING PROVIDER: Enid Harry, MD  REFERRING DIAG: Rt breast cancer with mastectomy   THERAPY DIAG:  Malignant neoplasm of upper-outer quadrant of right breast in female, estrogen  receptor negative (HCC)  Stiffness of right shoulder, not elsewhere classified  Stiffness of left shoulder, not elsewhere classified  Abnormal posture  Aftercare following surgery for neoplasm  ONSET DATE: 12/15/22  Rationale for Evaluation and Treatment: Rehabilitation  SUBJECTIVE:  SUBJECTIVE STATEMENT:  I did not like the foam roll we did last time! I did not feel great after that last time. I don't think my wound is closing but it's hard for me tell. I go back to see Dr. Delane Fear May 19, and I think I go back to the wound center next week.   PERTINENT HISTORY: Pt. Underwent a Right lumpectomy for Gr. 1 IDC on 03/02/2015 with 0/4 LN's. She had neoadjuvant chemo and radiation. In November of 2023 she noted breast asymmetry and patient was diagnosed on 10/07/2022 with right and left breast grade 2 IDC. It measures .8 cm on the left and a It is Triple Negative with a Ki67 of 95% . Right breast mass is 1.7 cm and 1 cm and Ki 67 of 50%. She had a right mastectomy and left lumpectomy with SLNB on 12/15/2022. Completed chemotherapy and then bil chest wall radiation ending 06/09/23. port removal 07/18/23 with need for re-excision and packing with infection. 2 poor healing wounds on mastectomy incision chronically - has a referral to hyperbaric O2.   PAIN:  Are you having pain?No  PRECAUTIONS: bil lymphedema risk   RED FLAGS: None   WEIGHT BEARING RESTRICTIONS: No  FALLS:  Has patient fallen in last 6 months? No  LIVING ENVIRONMENT: Lives with: lives with their family and lives with their spouse  OCCUPATION: not working   LEISURE: nothing really.  I would like this to heal so I can exercise more.    HAND DOMINANCE: can use both   PRIOR LEVEL OF FUNCTION: Independent  PATIENT GOALS: improve motion on  both arms    OBJECTIVE: Note: Objective measures were completed at Evaluation unless otherwise noted.  COGNITION: Overall cognitive status: Within functional limits for tasks assessed   PALPATION: +2 ttp Rt UT with more tightness here compared to the Lt side  OBSERVATIONS / OTHER ASSESSMENTS: wearing compression.  Incision healed except for 2 small erasure sized spots that pt has covered with a bandage.  The lateral incision almost folds over on itself.  Did not uncover bandage today.    POSTURE: guarded   UPPER EXTREMITY AROM/PROM:       01/16/24  A/PROM RIGHT   eval 11/28/2022 12/04/23 01/02/24 Right  Shoulder extension 63 45 - pain at the Rt pouch on the chest 55 53  Shoulder flexion 140 100 - pulling in the Rt trunk/ 110 pn 120 - pulling  138  Shoulder abduction 135 110 - it made the whole arm hurt / 145 pn 120 - tight  131  Shoulder internal rotation 65     Shoulder external rotation 84 70 / 65 85                           (Blank rows = not tested)   A/PROM LEFT   Eval 11/28/22 12/04/23 01/02/24 01/16/24  Shoulder extension 63 60    Shoulder flexion 165 140 / 170 145 154  Shoulder abduction 162 140 / 170 145 161  Shoulder internal rotation 73     Shoulder external rotation 87 80 / 85                            (Blank rows = not tested)   UPPER EXTREMITY STRENGTH:   LYMPHEDEMA ASSESSMENTS:    LANDMARK RIGHT   baseline  10 cm proximal to olecranon process 38.2  Olecranon process 28.5  10 cm proximal to ulnar styloid process 26.4  Just proximal to ulnar styloid process 18.05  Across hand at thumb web space 20.1  At base of 2nd digit 6.0  (Blank rows = not tested)   LANDMARK LEFT   baseline  10 cm proximal to olecranon process 38.4  Olecranon process 27.9  10 cm proximal to ulnar styloid process 25.1  Just proximal to ulnar styloid process 17.4  Across hand at thumb web space 19.8  At base of 2nd digit 5.6  (Blank rows = not tested)  QUICK DASH SURVEY: 43% from  34%                                                                                                                           TREATMENT DATE:  01/24/24: Therapeutic Exercises Pulleys into flex and abd x 2 mins each with VC's to remind pt of technique Roll yellow ball up wall into flex and and bil UE abd x 10 each UE Ranger for Rt UE flex x 10, then scaption near abd with pt returning therapist demo Free Motion Machine for Rows 3# x 10 reps returning therapist demo   01/16/24: Therapeutic Exercises Pulleys into flex and abd x 2 mins reminding pt to decrease Rt scapular compensation Roll yellow ball up wall into flex and Rt abd x 10 each Supine over half foam roll for following: Bil horz abd x 10, trial of bil UE scaption into a "V" but painful in t anterior shoulder so stopped, then bil UE abd in a "snow angel" x 10 with 5 sec holds returning therapist demo for each Manual Therapy P/ROM to Rt shoulder into flex, abd and D2 with scapular depression throughout by therapist and avoiding stretch to open wounds with blocking at pectoralis muscle STM during P/ROM to Rt pect insertion and lateral trunk where pt palpably very tight staying above and lateral to bandage avoiding pull to open wounds.   01/08/24: Manual Therapy P/ROM to Rt shoulder into flex, abd and D2 with scapular depression throughout by therapist and avoiding stretch to open wounds with blocking at pectoralis muscle STM during P/ROM to Rt pect insertion and lateral trunk where pt palpably very tight staying above and lateral to bandage avoiding pull to open wounds. Then into Lt S/L for cont'd STM to Rt medial scapular border, lateral trunk and UT. Pt was very tight at her UT and reports this felt much improved by end of session.   01/02/24: Remeasured AROM and discussed pain Therapeutic Exercises Pulleys into flex and abd x 2:30-3 mins each, VC's to remind pt of technique. Used step for more comfortable seated positioning.  Ball roll  up wall into flex x 10, bil abd x 5 each returning therapist demo.  Row yellow x 10  Supine chest stretch x 60"  Bil UE abd in a "snow angel" x 5, 5 sec holds Bil shoulder flexion with dowel with with pt getting "stuck" with it  overhead so stopped after two  Switched to standing AAROM with dowel flexion in a canoe rowing motion x 5  Manual Therapy P/ROM to bil shoulders into flex, abd and D2 with scapular depression throughout by therapist       PATIENT EDUCATION:  Education details: per today's note Person educated: Patient Education method: Programmer, multimedia, Facilities manager, and Handouts Education comprehension: verbalized understanding and returned demonstration  HOME EXERCISE PROGRAM: Code not copied: scapular retractions, posterior shoulder rolls, Rt UT stretch    ASSESSMENT:  CLINICAL IMPRESSION: Continued with Rt UE AA/A/ROM and postural strength. Also continued with manual therapy working to decrease Rt upper quadrant tightness and assess wounds. Mild visible improvement seemed to be noticeable. Pt had small amount of slough on outer edge of bigger opening so removed this for her with sterile Q tip and saline. Pt is considering speaking with Dr. Delane Fear about seeing Dr. Marieta Shorten for plastic surgery as he told her this was an option if wound care didn't work.     OBJECTIVE IMPAIRMENTS: decreased knowledge of condition, decreased knowledge of use of DME, decreased ROM, and decreased strength.   ACTIVITY LIMITATIONS: carrying, lifting, and reach over head  PARTICIPATION LIMITATIONS: cleaning and community activity  PERSONAL FACTORS: Age, Fitness, Time since onset of injury/illness/exacerbation, and 1-2 comorbidities: radiation, SLNB  are also affecting patient's functional outcome.   REHAB POTENTIAL: Excellent  CLINICAL DECISION MAKING: Stable/uncomplicated  EVALUATION COMPLEXITY: Low  GOALS: Goals reviewed with patient? Yes  SHORT TERM GOALS=LTGs: Target date:  02/27/24  Pt will improve Rt shoulder AROM into flexion and abduction equal to the baseline measurements  Baseline: Goal status: ONGOING, see flowsheet  2.  Pt will report being able to reach into her cabinet again with the help of her spatula which is normal for her Baseline: 01/16/24 - can do this but still limited at end reach Goal status: PARTIALLY MET  3.  Pt will improve Lt shoulder flexion and abduction to baseline measurements  Baseline:  Goal status: PARTIALLY MET, see flowsheet  4.  Pt will be ind with final HEP Baseline:  Goal status: ONGOING  5.  Pt will be educated on ABC video to watch for lymphedema education review Baseline:  Goal status: MET   PLAN:  PT FREQUENCY: 1-2x/week  PT DURATION: 6 weeks   PLANNED INTERVENTIONS: 97164- PT Re-evaluation, 97110-Therapeutic exercises, 97530- Therapeutic activity, 97112- Neuromuscular re-education, 97535- Self Care, 16109- Manual therapy, Patient/Family education, Therapeutic exercises, Therapeutic activity, Neuromuscular re-education, Gait training, and Self Care  PLAN FOR NEXT SESSION: Cont Rt shoulder AAROM/PROM, include Rt UT work prn and postural strength  Denyce Flank, PTA 01/24/2024, 3:51 PM

## 2024-01-30 ENCOUNTER — Encounter (HOSPITAL_BASED_OUTPATIENT_CLINIC_OR_DEPARTMENT_OTHER): Attending: Internal Medicine | Admitting: Internal Medicine

## 2024-01-30 DIAGNOSIS — L598 Other specified disorders of the skin and subcutaneous tissue related to radiation: Secondary | ICD-10-CM | POA: Diagnosis not present

## 2024-01-30 DIAGNOSIS — T8131XA Disruption of external operation (surgical) wound, not elsewhere classified, initial encounter: Secondary | ICD-10-CM | POA: Diagnosis not present

## 2024-01-30 DIAGNOSIS — Y838 Other surgical procedures as the cause of abnormal reaction of the patient, or of later complication, without mention of misadventure at the time of the procedure: Secondary | ICD-10-CM | POA: Diagnosis not present

## 2024-01-30 DIAGNOSIS — C50911 Malignant neoplasm of unspecified site of right female breast: Secondary | ICD-10-CM

## 2024-01-30 DIAGNOSIS — S21001A Unspecified open wound of right breast, initial encounter: Secondary | ICD-10-CM | POA: Insufficient documentation

## 2024-01-30 DIAGNOSIS — L98492 Non-pressure chronic ulcer of skin of other sites with fat layer exposed: Secondary | ICD-10-CM | POA: Diagnosis not present

## 2024-01-30 DIAGNOSIS — Z923 Personal history of irradiation: Secondary | ICD-10-CM | POA: Diagnosis not present

## 2024-01-30 DIAGNOSIS — Z853 Personal history of malignant neoplasm of breast: Secondary | ICD-10-CM | POA: Insufficient documentation

## 2024-01-30 DIAGNOSIS — Z9011 Acquired absence of right breast and nipple: Secondary | ICD-10-CM | POA: Insufficient documentation

## 2024-01-31 ENCOUNTER — Ambulatory Visit

## 2024-01-31 DIAGNOSIS — C50411 Malignant neoplasm of upper-outer quadrant of right female breast: Secondary | ICD-10-CM | POA: Diagnosis not present

## 2024-01-31 DIAGNOSIS — Z483 Aftercare following surgery for neoplasm: Secondary | ICD-10-CM

## 2024-01-31 DIAGNOSIS — R293 Abnormal posture: Secondary | ICD-10-CM

## 2024-01-31 DIAGNOSIS — M25612 Stiffness of left shoulder, not elsewhere classified: Secondary | ICD-10-CM

## 2024-01-31 DIAGNOSIS — L598 Other specified disorders of the skin and subcutaneous tissue related to radiation: Secondary | ICD-10-CM | POA: Diagnosis not present

## 2024-01-31 DIAGNOSIS — M25611 Stiffness of right shoulder, not elsewhere classified: Secondary | ICD-10-CM

## 2024-01-31 NOTE — Therapy (Signed)
 OUTPATIENT PHYSICAL THERAPY  UPPER EXTREMITY ONCOLOGY TREATMENT  Patient Name: Katie Jacobson MRN: [id number] DOB:01/20/1956, 68 y.o., female Today's Date: 01/31/2024  END OF SESSION:  PT End of Session - 01/31/24 1404     Visit Number 8    Number of Visits 17    Date for PT Re-Evaluation 02/27/24    Authorization Type none    PT Start Time 1401    PT Stop Time 1457    PT Time Calculation (min) 56 min    Activity Tolerance Patient tolerated treatment well    Behavior During Therapy [facility] for tasks assessed/performed              Past Medical History:  Diagnosis Date   Allergy    Arthritis    Breast cancer of upper-outer quadrant of right female breast (HCC) 11/14/2014   Treated with chemotherapy and radiation (diagnosed again in March 2024 in both breasts)   Heart murmur    History of chemotherapy    finished chemo 02/03/2015   History of kidney stones    History of seizures    as a child - unknown cause - states was never on anticonvulsants   Hypertension    states under control with meds., has been on med. x "years"   Mitral regurgitation    Mitral valve prolapse 1990   Echo 2023 with no MVP. Mild-mod aortic regurg and mild mitral regurg. 3 yr followup recommended.   Osteoporosis    Personal history of chemotherapy    Personal history of radiation therapy    Port-A-Cath in place 01/24/2023   Seasonal allergies    Past Surgical History:  Procedure Laterality Date   ABDOMINAL HYSTERECTOMY  1998   partial   APPENDECTOMY  1976   BREAST BIOPSY Right 09/30/2022   US  RT BREAST BX W LOC DEV 1ST LESION IMG BX SPEC US  GUIDE 09/30/2022 [facility]   BREAST BIOPSY Left 09/30/2022   US  LT BREAST BX W LOC DEV 1ST LESION IMG BX SPEC US  GUIDE 09/30/2022 [facility]   BREAST BIOPSY Right 09/30/2022   US  RT BREAST BX W LOC DEV EA ADD LESION IMG BX SPEC US  GUIDE 09/30/2022 [facility]   BREAST BIOPSY  12/14/2022   MM LT RADIOACTIVE SEED LOC MAMMO  GUIDE 12/14/2022 [facility]   BREAST LUMPECTOMY Right 02/2015   BREAST LUMPECTOMY WITH RADIOACTIVE SEED AND SENTINEL LYMPH NODE BIOPSY Left 12/15/2022   Procedure: LEFT BREAST LUMPECTOMY WITH RADIOACTIVE SEED AND AXILLARY SENTINEL LYMPH NODE BIOPSY;  Surgeon: Enid Harry, MD;  Location: [facility] OR;  Service: General;  Laterality: Left;   COLONOSCOPY  09/30/2016   COMPLEX WOUND CLOSURE N/A 07/18/2023   Procedure: CLOSURE CHEST WALL WOUND X2;  Surgeon: Enid Harry, MD;  Location: [facility] OR;  Service: General;  Laterality: N/A;   KNEE ARTHROSCOPY Left 03/26/2008   MASTECTOMY     MASTECTOMY W/ SENTINEL NODE BIOPSY Right 12/15/2022   Procedure: RIGHT MASTECTOMY WITH RIGHT AXILLARY SENTINEL LYMPH NODE BIOPSY;  Surgeon: Enid Harry, MD;  Location: [facility] OR;  Service: General;  Laterality: Right;  180 MIN ROOM 9   PLANTAR'S WART EXCISION Left    x 2   PORT-A-CATH REMOVAL Left 04/06/2015   Procedure: REMOVAL PORT-A-CATH;  Surgeon: Enid Harry, MD;  Location: [facility];  Service: General;  Laterality: Left;   PORT-A-CATH REMOVAL N/A 07/18/2023   Procedure: REMOVAL PORT-A-CATH;  Surgeon: Enid Harry, MD;  Location: [facility] OR;  Service: General;  Laterality: N/A;  PORTACATH PLACEMENT N/A 11/27/2014   Procedure: INSERTION PORT-A-CATH;  Surgeon: Enid Harry, MD;  Location: [facility];  Service: General;  Laterality: N/A;   PORTACATH PLACEMENT Right 12/15/2022   Procedure: INSERTION PORT-A-CATH;  Surgeon: Enid Harry, MD;  Location: [facility] OR;  Service: General;  Laterality: Right;   RADIOACTIVE SEED GUIDED PARTIAL MASTECTOMY WITH AXILLARY SENTINEL LYMPH NODE BIOPSY Right 03/02/2015   Procedure: RADIOACTIVE SEED GUIDED RIGHT PARTIAL MASTECTOMY WITH RIGHT AXILLARY SENTINEL LYMPH NODE BIOPSY;  Surgeon: Enid Harry, MD;  Location: [facility];  Service: General;  Laterality: Right;   SALIVARY STONE REMOVAL Right 1972    TONSILLECTOMY  1970s   TRANSTHORACIC ECHOCARDIOGRAM  01/16/2020   EF 60 to 65%.  GRII DD.  No R WMA.  Normal RV size and function.  Rheumatic mitral valve with moderate thickening-hockey-stick appearing.  Moderate MR with mild MS.  Moderate LA dilation.  (Recommend follow-up in 2 to 3 years)   TRANSTHORACIC ECHOCARDIOGRAM  05/31/2022   Normal LV size and function-EF 60 to 65%.  No RWMA. ??  Normal diastolic parameters but elevated LAP?  Normal RV with normal RVP and RAP.  Manage mitral valve with mild MR and no MS.  Mild to moderate AI with aortic sclerosis but no stenosis.   TUBAL LIGATION  1996   Patient Active Problem List   Diagnosis Date Noted   Breast cancer (HCC) 12/15/2022   Bilateral malignant neoplasm of breast in female [facility]) 11/02/2022   Allergic reaction to chemical substance 01/30/2022   Preventative health care 01/28/2022   Cervical radiculopathy 04/20/2021   Primary osteoarthritis of left knee 03/09/2021   Vitamin D deficiency 07/29/2019   Smoking greater than 40 pack years 07/27/2019   Class 3 severe obesity due to excess calories without serious comorbidity in adult [facility]) 07/26/2019   Genetic testing 01/25/2016   Microscopic hematuria 09/06/2015   Vaginal dryness 12/09/2014   Family history of breast cancer    Malignant neoplasm of upper-outer quadrant of right breast in female, estrogen receptor negative (HCC) 11/14/2014   Intermittent palpitations    Obesity (BMI 35.0-39.9 without comorbidity)    Hyperlipidemia LDL goal <100 07/07/2011   History of partial seizures 04/01/2011   Asthma 04/01/2011   Hot flashes 04/01/2011   Tobacco abuse 04/01/2011   Essential hypertension 04/01/2011   Mitral regurgitation due to cusp prolapse 04/01/2011    PCP: Dorrene Gaucher, NP  REFERRING PROVIDER: Enid Harry, MD  REFERRING DIAG: Rt breast cancer with mastectomy   THERAPY DIAG:  Malignant neoplasm of upper-outer quadrant of right breast in female, estrogen  receptor negative (HCC)  Stiffness of right shoulder, not elsewhere classified  Stiffness of left shoulder, not elsewhere classified  Abnormal posture  Aftercare following surgery for neoplasm  ONSET DATE: 12/15/22  Rationale for Evaluation and Treatment: Rehabilitation  SUBJECTIVE:  SUBJECTIVE STATEMENT:  I am really tired today. I went back to the wound center yesterday. The nurse said the wounds seemed to look a little better. They switched the packing I'm using in the dressing so now I cut a strip, spray it with the saline and out it in the wound and leave the tail out to pull it out later because this one doesn't dissolve.   PERTINENT HISTORY: Pt. Underwent a Right lumpectomy for Gr. 1 IDC on 03/02/2015 with 0/4 LN's. She had neoadjuvant chemo and radiation. In November of 2023 she noted breast asymmetry and patient was diagnosed on 10/07/2022 with right and left breast grade 2 IDC. It measures .8 cm on the left and a It is Triple Negative with a Ki67 of 95% . Right breast mass is 1.7 cm and 1 cm and Ki 67 of 50%. She had a right mastectomy and left lumpectomy with SLNB on 12/15/2022. Completed chemotherapy and then bil chest wall radiation ending 06/09/23. port removal 07/18/23 with need for re-excision and packing with infection. 2 poor healing wounds on mastectomy incision chronically - has a referral to hyperbaric O2.   PAIN:  Are you having pain?No  PRECAUTIONS: bil lymphedema risk   RED FLAGS: None   WEIGHT BEARING RESTRICTIONS: No  FALLS:  Has patient fallen in last 6 months? No  LIVING ENVIRONMENT: Lives with: lives with their family and lives with their spouse  OCCUPATION: not working   LEISURE: nothing really.  I would like this to heal so I can exercise more.    HAND DOMINANCE: can use  both   PRIOR LEVEL OF FUNCTION: Independent  PATIENT GOALS: improve motion on both arms    OBJECTIVE: Note: Objective measures were completed at Evaluation unless otherwise noted.  COGNITION: Overall cognitive status: Within functional limits for tasks assessed   PALPATION: +2 ttp Rt UT with more tightness here compared to the Lt side  OBSERVATIONS / OTHER ASSESSMENTS: wearing compression.  Incision healed except for 2 small erasure sized spots that pt has covered with a bandage.  The lateral incision almost folds over on itself.  Did not uncover bandage today.    POSTURE: guarded   UPPER EXTREMITY AROM/PROM:       01/16/24  A/PROM RIGHT   eval 11/28/2022 12/04/23 01/02/24 Right  Shoulder extension 63 45 - pain at the Rt pouch on the chest 55 53  Shoulder flexion 140 100 - pulling in the Rt trunk/ 110 pn 120 - pulling  138  Shoulder abduction 135 110 - it made the whole arm hurt / 145 pn 120 - tight  131  Shoulder internal rotation 65     Shoulder external rotation 84 70 / 65 85                           (Blank rows = not tested)   A/PROM LEFT   Eval 11/28/22 12/04/23 01/02/24 01/16/24  Shoulder extension 63 60    Shoulder flexion 165 140 / 170 145 154  Shoulder abduction 162 140 / 170 145 161  Shoulder internal rotation 73     Shoulder external rotation 87 80 / 85                            (Blank rows = not tested)   UPPER EXTREMITY STRENGTH:   LYMPHEDEMA ASSESSMENTS:    LANDMARK RIGHT   baseline  10 cm proximal to olecranon process 38.2  Olecranon process 28.5  10 cm proximal to ulnar styloid process 26.4  Just proximal to ulnar styloid process 18.05  Across hand at thumb web space 20.1  At base of 2nd digit 6.0  (Blank rows = not tested)   LANDMARK LEFT   baseline  10 cm proximal to olecranon process 38.4  Olecranon process 27.9  10 cm proximal to ulnar styloid process 25.1  Just proximal to ulnar styloid process 17.4  Across hand at thumb web space 19.8  At  base of 2nd digit 5.6  (Blank rows = not tested)  QUICK DASH SURVEY: 43% from 34%                                                                                                                           TREATMENT DATE:  01/31/24: Therapeutic Exercises Pulleys into flex and abd x 2 mins each Manual Therapy P/ROM to Rt shoulder into flex, abd and D2 with scapular depression throughout by therapist and avoiding stretch to open wounds with blocking at pectoralis muscle STM in supine during P/ROM to Rt pect insertion and lateral trunk where pt palpably very tight staying above and lateral to bandage avoiding pull to open wounds; then into Lt S/L for focus to medial scapular border and UT  01/24/24: Therapeutic Exercises Pulleys into flex and abd x 2 mins each with VC's to remind pt of technique Roll yellow ball up wall into flex and and bil UE abd x 10 each UE Ranger for Rt UE flex x 10, then scaption near abd with pt returning therapist demo Free Motion Machine for Rows 3# x 10 reps returning therapist demo   01/16/24: Therapeutic Exercises Pulleys into flex and abd x 2 mins reminding pt to decrease Rt scapular compensation Roll yellow ball up wall into flex and Rt abd x 10 each Supine over half foam roll for following: Bil horz abd x 10, trial of bil UE scaption into a "V" but painful in t anterior shoulder so stopped, then bil UE abd in a "snow angel" x 10 with 5 sec holds returning therapist demo for each Manual Therapy P/ROM to Rt shoulder into flex, abd and D2 with scapular depression throughout by therapist and avoiding stretch to open wounds with blocking at pectoralis muscle STM during P/ROM to Rt pect insertion and lateral trunk where pt palpably very tight staying above and lateral to bandage avoiding pull to open wounds.   01/08/24: Manual Therapy P/ROM to Rt shoulder into flex, abd and D2 with scapular depression throughout by therapist and avoiding stretch to open wounds with  blocking at pectoralis muscle STM during P/ROM to Rt pect insertion and lateral trunk where pt palpably very tight staying above and lateral to bandage avoiding pull to open wounds. Then into Lt S/L for cont'd STM to Rt medial scapular border, lateral trunk and UT. Pt was very tight at her UT and reports  this felt much improved by end of session.   01/02/24: Remeasured AROM and discussed pain Therapeutic Exercises Pulleys into flex and abd x 2:30-3 mins each, VC's to remind pt of technique. Used step for more comfortable seated positioning.  Ball roll up wall into flex x 10, bil abd x 5 each returning therapist demo.  Row yellow x 10  Supine chest stretch x 60"  Bil UE abd in a "snow angel" x 5, 5 sec holds Bil shoulder flexion with dowel with with pt getting "stuck" with it overhead so stopped after two  Switched to standing AAROM with dowel flexion in a canoe rowing motion x 5  Manual Therapy P/ROM to bil shoulders into flex, abd and D2 with scapular depression throughout by therapist       PATIENT EDUCATION:  Education details: per today's note Person educated: Patient Education method: Programmer, multimedia, Facilities manager, and Handouts Education comprehension: verbalized understanding and returned demonstration  HOME EXERCISE PROGRAM: Code not copied: scapular retractions, posterior shoulder rolls, Rt UT stretch    ASSESSMENT:  CLINICAL IMPRESSION: Pt comes in c/o fatigue today so just did pulleys. Then focused on manual therapy working to cont decrease Rt upper quadrant tightness.     OBJECTIVE IMPAIRMENTS: decreased knowledge of condition, decreased knowledge of use of DME, decreased ROM, and decreased strength.   ACTIVITY LIMITATIONS: carrying, lifting, and reach over head  PARTICIPATION LIMITATIONS: cleaning and community activity  PERSONAL FACTORS: Age, Fitness, Time since onset of injury/illness/exacerbation, and 1-2 comorbidities: radiation, SLNB  are also affecting patient's  functional outcome.   REHAB POTENTIAL: Excellent  CLINICAL DECISION MAKING: Stable/uncomplicated  EVALUATION COMPLEXITY: Low  GOALS: Goals reviewed with patient? Yes  SHORT TERM GOALS=LTGs: Target date: 02/27/24  Pt will improve Rt shoulder AROM into flexion and abduction equal to the baseline measurements  Baseline: Goal status: ONGOING, see flowsheet  2.  Pt will report being able to reach into her cabinet again with the help of her spatula which is normal for her Baseline: 01/16/24 - can do this but still limited at end reach Goal status: PARTIALLY MET  3.  Pt will improve Lt shoulder flexion and abduction to baseline measurements  Baseline:  Goal status: PARTIALLY MET, see flowsheet  4.  Pt will be ind with final HEP Baseline:  Goal status: ONGOING  5.  Pt will be educated on ABC video to watch for lymphedema education review Baseline:  Goal status: MET   PLAN:  PT FREQUENCY: 1-2x/week  PT DURATION: 6 weeks   PLANNED INTERVENTIONS: 97164- PT Re-evaluation, 97110-Therapeutic exercises, 97530- Therapeutic activity, 97112- Neuromuscular re-education, 97535- Self Care, [id number]- Manual therapy, Patient/Family education, Therapeutic exercises, Therapeutic activity, Neuromuscular re-education, Gait training, and Self Care  PLAN FOR NEXT SESSION: Cont Rt shoulder AAROM/PROM, include Rt UT work prn and postural strength  Denyce Flank, PTA 01/31/2024, 3:02 PM

## 2024-02-14 ENCOUNTER — Ambulatory Visit: Attending: General Surgery

## 2024-02-14 DIAGNOSIS — Z171 Estrogen receptor negative status [ER-]: Secondary | ICD-10-CM | POA: Diagnosis not present

## 2024-02-14 DIAGNOSIS — R293 Abnormal posture: Secondary | ICD-10-CM | POA: Insufficient documentation

## 2024-02-14 DIAGNOSIS — C50411 Malignant neoplasm of upper-outer quadrant of right female breast: Secondary | ICD-10-CM | POA: Insufficient documentation

## 2024-02-14 DIAGNOSIS — Z483 Aftercare following surgery for neoplasm: Secondary | ICD-10-CM | POA: Diagnosis not present

## 2024-02-14 DIAGNOSIS — M25611 Stiffness of right shoulder, not elsewhere classified: Secondary | ICD-10-CM | POA: Insufficient documentation

## 2024-02-14 DIAGNOSIS — M25612 Stiffness of left shoulder, not elsewhere classified: Secondary | ICD-10-CM | POA: Insufficient documentation

## 2024-02-14 NOTE — Therapy (Signed)
 OUTPATIENT PHYSICAL THERAPY  UPPER EXTREMITY ONCOLOGY TREATMENT  Patient Name: Katie Jacobson MRN: [id number] DOB:08-Jun-1956, 68 y.o., female Today's Date: 02/14/2024  END OF SESSION:  PT End of Session - 02/14/24 1506     Visit Number 9    Number of Visits 17    Date for PT Re-Evaluation 02/27/24    Authorization Type none    PT Start Time 1503    PT Stop Time 1558    PT Time Calculation (min) 55 min    Activity Tolerance Patient tolerated treatment well    Behavior During Therapy [facility] for tasks assessed/performed              Past Medical History:  Diagnosis Date   Allergy    Arthritis    Breast cancer of upper-outer quadrant of right female breast (HCC) 11/14/2014   Treated with chemotherapy and radiation (diagnosed again in March 2024 in both breasts)   Heart murmur    History of chemotherapy    finished chemo 02/03/2015   History of kidney stones    History of seizures    as a child - unknown cause - states was never on anticonvulsants   Hypertension    states under control with meds., has been on med. x "years"   Mitral regurgitation    Mitral valve prolapse 1990   Echo 2023 with no MVP. Mild-mod aortic regurg and mild mitral regurg. 3 yr followup recommended.   Osteoporosis    Personal history of chemotherapy    Personal history of radiation therapy    Port-A-Cath in place 01/24/2023   Seasonal allergies    Past Surgical History:  Procedure Laterality Date   ABDOMINAL HYSTERECTOMY  1998   partial   APPENDECTOMY  1976   BREAST BIOPSY Right 09/30/2022   US  RT BREAST BX W LOC DEV 1ST LESION IMG BX SPEC US  GUIDE 09/30/2022 [facility]   BREAST BIOPSY Left 09/30/2022   US  LT BREAST BX W LOC DEV 1ST LESION IMG BX SPEC US  GUIDE 09/30/2022 [facility]   BREAST BIOPSY Right 09/30/2022   US  RT BREAST BX W LOC DEV EA ADD LESION IMG BX SPEC US  GUIDE 09/30/2022 [facility]   BREAST BIOPSY  12/14/2022   MM LT RADIOACTIVE SEED LOC MAMMO GUIDE  12/14/2022 [facility]   BREAST LUMPECTOMY Right 02/2015   BREAST LUMPECTOMY WITH RADIOACTIVE SEED AND SENTINEL LYMPH NODE BIOPSY Left 12/15/2022   Procedure: LEFT BREAST LUMPECTOMY WITH RADIOACTIVE SEED AND AXILLARY SENTINEL LYMPH NODE BIOPSY;  Surgeon: Enid Harry, MD;  Location: [facility] OR;  Service: General;  Laterality: Left;   COLONOSCOPY  09/30/2016   COMPLEX WOUND CLOSURE N/A 07/18/2023   Procedure: CLOSURE CHEST WALL WOUND X2;  Surgeon: Enid Harry, MD;  Location: [facility] OR;  Service: General;  Laterality: N/A;   KNEE ARTHROSCOPY Left 03/26/2008   MASTECTOMY     MASTECTOMY W/ SENTINEL NODE BIOPSY Right 12/15/2022   Procedure: RIGHT MASTECTOMY WITH RIGHT AXILLARY SENTINEL LYMPH NODE BIOPSY;  Surgeon: Enid Harry, MD;  Location: [facility] OR;  Service: General;  Laterality: Right;  180 MIN ROOM 9   PLANTAR'S WART EXCISION Left    x 2   PORT-A-CATH REMOVAL Left 04/06/2015   Procedure: REMOVAL PORT-A-CATH;  Surgeon: Enid Harry, MD;  Location: [facility];  Service: General;  Laterality: Left;   PORT-A-CATH REMOVAL N/A 07/18/2023   Procedure: REMOVAL PORT-A-CATH;  Surgeon: Enid Harry, MD;  Location: [facility] OR;  Service: General;  Laterality: N/A;  PORTACATH PLACEMENT N/A 11/27/2014   Procedure: INSERTION PORT-A-CATH;  Surgeon: Enid Harry, MD;  Location: [facility];  Service: General;  Laterality: N/A;   PORTACATH PLACEMENT Right 12/15/2022   Procedure: INSERTION PORT-A-CATH;  Surgeon: Enid Harry, MD;  Location: [facility] OR;  Service: General;  Laterality: Right;   RADIOACTIVE SEED GUIDED PARTIAL MASTECTOMY WITH AXILLARY SENTINEL LYMPH NODE BIOPSY Right 03/02/2015   Procedure: RADIOACTIVE SEED GUIDED RIGHT PARTIAL MASTECTOMY WITH RIGHT AXILLARY SENTINEL LYMPH NODE BIOPSY;  Surgeon: Enid Harry, MD;  Location: [facility];  Service: General;  Laterality: Right;   SALIVARY STONE REMOVAL Right 1972   TONSILLECTOMY   1970s   TRANSTHORACIC ECHOCARDIOGRAM  01/16/2020   EF 60 to 65%.  GRII DD.  No R WMA.  Normal RV size and function.  Rheumatic mitral valve with moderate thickening-hockey-stick appearing.  Moderate MR with mild MS.  Moderate LA dilation.  (Recommend follow-up in 2 to 3 years)   TRANSTHORACIC ECHOCARDIOGRAM  05/31/2022   Normal LV size and function-EF 60 to 65%.  No RWMA. ??  Normal diastolic parameters but elevated LAP?  Normal RV with normal RVP and RAP.  Manage mitral valve with mild MR and no MS.  Mild to moderate AI with aortic sclerosis but no stenosis.   TUBAL LIGATION  1996   Patient Active Problem List   Diagnosis Date Noted   Breast cancer (HCC) 12/15/2022   Bilateral malignant neoplasm of breast in female [facility]) 11/02/2022   Allergic reaction to chemical substance 01/30/2022   Preventative health care 01/28/2022   Cervical radiculopathy 04/20/2021   Primary osteoarthritis of left knee 03/09/2021   Vitamin D deficiency 07/29/2019   Smoking greater than 40 pack years 07/27/2019   Class 3 severe obesity due to excess calories without serious comorbidity in adult 07/26/2019   Genetic testing 01/25/2016   Microscopic hematuria 09/06/2015   Vaginal dryness 12/09/2014   Family history of breast cancer    Malignant neoplasm of upper-outer quadrant of right breast in female, estrogen receptor negative (HCC) 11/14/2014   Intermittent palpitations    Obesity (BMI 35.0-39.9 without comorbidity)    Hyperlipidemia LDL goal <100 07/07/2011   History of partial seizures 04/01/2011   Asthma 04/01/2011   Hot flashes 04/01/2011   Tobacco abuse 04/01/2011   Essential hypertension 04/01/2011   Mitral regurgitation due to cusp prolapse 04/01/2011    PCP: Dorrene Gaucher, NP  REFERRING PROVIDER: Enid Harry, MD  REFERRING DIAG: Rt breast cancer with mastectomy   THERAPY DIAG:  Malignant neoplasm of upper-outer quadrant of right breast in female, estrogen receptor negative  (HCC)  Stiffness of right shoulder, not elsewhere classified  Stiffness of left shoulder, not elsewhere classified  Abnormal posture  Aftercare following surgery for neoplasm  ONSET DATE: 12/15/22  Rationale for Evaluation and Treatment: Rehabilitation  SUBJECTIVE:  SUBJECTIVE STATEMENT:  I think the wounds are getting better. I've been using my natural treatment and I'm also still packing it with the stuff the wound center gave me that dissolves. I think I want to be on hold until I see Dr. Delane Fear on 5/19 and see what he says. I can raise my arm higher and the trunk tightness is still there but better. Also, I still get the cramps when I use that Rt arm too much, but the intensity and freq is less. I'm also going to start wearing my compression sleeve when I'm doing my house cleaning or yard work.   PERTINENT HISTORY: Pt underwent a Right lumpectomy for Gr. 1 IDC on 03/02/2015 with 0/4 LN's. She had neoadjuvant chemo and radiation. In November of 2023 she noted breast asymmetry and patient was diagnosed on 10/07/2022 with right and left breast grade 2 IDC. It measures .8 cm on the left and a It is Triple Negative with a Ki67 of 95% . Right breast mass is 1.7 cm and 1 cm and Ki 67 of 50%. She had a right mastectomy and left lumpectomy with SLNB on 12/15/2022. Completed chemotherapy and then bil chest wall radiation ending 06/09/23. port removal 07/18/23 with need for re-excision and packing with infection. 2 poor healing wounds on mastectomy incision chronically - has a referral to hyperbaric O2.   PAIN:  Are you having pain?No  PRECAUTIONS: bil lymphedema risk   RED FLAGS: None   WEIGHT BEARING RESTRICTIONS: No  FALLS:  Has patient fallen in last 6 months? No  LIVING ENVIRONMENT: Lives with: lives with  their family and lives with their spouse  OCCUPATION: not working   LEISURE: nothing really.  I would like this to heal so I can exercise more.    HAND DOMINANCE: can use both   PRIOR LEVEL OF FUNCTION: Independent  PATIENT GOALS: improve motion on both arms    OBJECTIVE: Note: Objective measures were completed at Evaluation unless otherwise noted.  COGNITION: Overall cognitive status: Within functional limits for tasks assessed   PALPATION: +2 ttp Rt UT with more tightness here compared to the Lt side  OBSERVATIONS / OTHER ASSESSMENTS: wearing compression.  Incision healed except for 2 small erasure sized spots that pt has covered with a bandage.  The lateral incision almost folds over on itself.  Did not uncover bandage today.    POSTURE: guarded   UPPER EXTREMITY AROM/PROM:       01/16/24 02/14/24  A/PROM RIGHT   eval 11/28/2022 12/04/23 Eval 01/02/24 Right Right  Shoulder extension 63 45 - pain at the Rt pouch on the chest 55 53 61  Shoulder flexion 140 100 - pulling in the Rt trunk/ 110 pn 120 - pulling  138 144  Shoulder abduction 135 110 - it made the whole arm hurt / 145 pn 120 - tight  131 146  Shoulder internal rotation 65      Shoulder external rotation 84 70 / 65 85                            (Blank rows = not tested)   A/PROM LEFT   Eval 11/28/22 12/04/23 Eval 01/02/24 01/16/24 02/14/24  Shoulder extension 63 60     Shoulder flexion 165 140 / 170 145 154 157  Shoulder abduction 162 140 / 170 145 161 163  Shoulder internal rotation 73      Shoulder external rotation 87 80 /  85                             (Blank rows = not tested)   UPPER EXTREMITY STRENGTH:   LYMPHEDEMA ASSESSMENTS:    LANDMARK RIGHT   baseline  10 cm proximal to olecranon process 38.2  Olecranon process 28.5  10 cm proximal to ulnar styloid process 26.4  Just proximal to ulnar styloid process 18.05  Across hand at thumb web space 20.1  At base of 2nd digit 6.0  (Blank rows = not tested)    LANDMARK LEFT   baseline  10 cm proximal to olecranon process 38.4  Olecranon process 27.9  10 cm proximal to ulnar styloid process 25.1  Just proximal to ulnar styloid process 17.4  Across hand at thumb web space 19.8  At base of 2nd digit 5.6  (Blank rows = not tested)  QUICK DASH SURVEY: 43% from 34%                                                                                                                           TREATMENT DATE:  02/14/24: Manual Therapy P/ROM to Rt shoulder into flex, abd and D2 with scapular depression throughout by therapist and avoiding stretch to open wounds with blocking at pectoralis muscle STM in supine during P/ROM to Rt pect insertion (superior aspect) and lateral trunk where pt palpably tight staying above and lateral to bandage avoiding pull to open wounds; then into Lt S/L for focus to medial scapular border and UT Scap Mobs when in Lt S/L into protraction and retraction   01/31/24: Therapeutic Exercises Pulleys into flex and abd x 2 mins each Manual Therapy P/ROM to Rt shoulder into flex, abd and D2 with scapular depression throughout by therapist and avoiding stretch to open wounds with blocking at pectoralis muscle STM in supine during P/ROM to Rt pect insertion and lateral trunk where pt palpably very tight staying above and lateral to bandage avoiding pull to open wounds; then into Lt S/L for focus to medial scapular border and UT  01/24/24: Therapeutic Exercises Pulleys into flex and abd x 2 mins each with VC's to remind pt of technique Roll yellow ball up wall into flex and and bil UE abd x 10 each UE Ranger for Rt UE flex x 10, then scaption near abd with pt returning therapist Paramedic for Rows 3# x 10 reps returning therapist demo     PATIENT EDUCATION:  Education details: per today's note Person educated: Patient Education method: Programmer, multimedia, Facilities manager, and Handouts Education comprehension: verbalized  understanding and returned demonstration  HOME EXERCISE PROGRAM: Code not copied: scapular retractions, posterior shoulder rolls, Rt UT stretch    ASSESSMENT:  CLINICAL IMPRESSION: Pt reports overall feeling like her A/ROM of bil shoulders has improved and her A/ROM measurements show this. She reports able to reach without restriction with her Lt UE, and improvement noted  but still some restriction from tightness with her Rt UE but still has open healing wounds which limits her motion, partially meeting that goal. She has met all other goals at this time. Pt would like to be placed on hold to work towards independence until she sees Dr. Delane Fear in a few weeks. Plans to call us  to let us  know if D/C or cont at that time.    OBJECTIVE IMPAIRMENTS: decreased knowledge of condition, decreased knowledge of use of DME, decreased ROM, and decreased strength.   ACTIVITY LIMITATIONS: carrying, lifting, and reach over head  PARTICIPATION LIMITATIONS: cleaning and community activity  PERSONAL FACTORS: Age, Fitness, Time since onset of injury/illness/exacerbation, and 1-2 comorbidities: radiation, SLNB  are also affecting patient's functional outcome.   REHAB POTENTIAL: Excellent  CLINICAL DECISION MAKING: Stable/uncomplicated  EVALUATION COMPLEXITY: Low  GOALS: Goals reviewed with patient? Yes  SHORT TERM GOALS=LTGs: Target date: 02/27/24  Pt will improve Rt shoulder AROM into flexion and abduction equal to the baseline measurements  Baseline: 12/04/23 - Flex 100 and Abd 110 degrees; 02/14/24 - Flex 144 and Abd 146 degrees Goal status: MET  2.  Pt will report being able to reach into her cabinet again with the help of her spatula which is normal for her Baseline: 01/16/24 - can do this but still limited at end reach; 02/14/24 - still having trouble with end Rt shoulder ROM reach,some of this may be due to healing wounds, also struggles with reaching fan light pull; able to reach without restriction  with Lt UE now Goal status: PARTIALLY MET  3.  Pt will improve Lt shoulder flexion and abduction to baseline measurements  Baseline: 12/04/23 - Flex and Abd 140 degrees; 02/14/24 - Flex 157 and Abd 163 degrees Goal status: MET  4.  Pt will be ind with final HEP Baseline:  Goal status: MET  5.  Pt will be educated on ABC video to watch for lymphedema education review Baseline:  Goal status: MET   PLAN:  PT FREQUENCY: 1-2x/week  PT DURATION: 6 weeks   PLANNED INTERVENTIONS: 97164- PT Re-evaluation, 97110-Therapeutic exercises, 97530- Therapeutic activity, 97112- Neuromuscular re-education, 97535- Self Care, 32440- Manual therapy, Patient/Family education, Therapeutic exercises, Therapeutic activity, Neuromuscular re-education, Gait training, and Self Care  PLAN FOR NEXT SESSION: Pt on hold until she sees Dr. Delane Fear on 5/1p. If she returns reassess goals/A/ROM and cont Rt shoulder AAROM/PROM, include Rt UT work prn and postural strength  Denyce Flank, PTA 02/14/2024, 5:27 PM

## 2024-02-17 ENCOUNTER — Other Ambulatory Visit: Payer: Self-pay | Admitting: Family

## 2024-02-26 DIAGNOSIS — C50411 Malignant neoplasm of upper-outer quadrant of right female breast: Secondary | ICD-10-CM | POA: Diagnosis not present

## 2024-02-26 DIAGNOSIS — Z171 Estrogen receptor negative status [ER-]: Secondary | ICD-10-CM | POA: Diagnosis not present

## 2024-02-28 ENCOUNTER — Encounter (HOSPITAL_BASED_OUTPATIENT_CLINIC_OR_DEPARTMENT_OTHER): Admitting: Internal Medicine

## 2024-03-11 DIAGNOSIS — Z853 Personal history of malignant neoplasm of breast: Secondary | ICD-10-CM | POA: Diagnosis not present

## 2024-03-11 DIAGNOSIS — Z923 Personal history of irradiation: Secondary | ICD-10-CM | POA: Diagnosis not present

## 2024-03-11 DIAGNOSIS — Z9011 Acquired absence of right breast and nipple: Secondary | ICD-10-CM | POA: Diagnosis not present

## 2024-03-18 ENCOUNTER — Encounter (HOSPITAL_BASED_OUTPATIENT_CLINIC_OR_DEPARTMENT_OTHER): Attending: Internal Medicine | Admitting: Internal Medicine

## 2024-03-18 DIAGNOSIS — L98492 Non-pressure chronic ulcer of skin of other sites with fat layer exposed: Secondary | ICD-10-CM

## 2024-03-18 DIAGNOSIS — L598 Other specified disorders of the skin and subcutaneous tissue related to radiation: Secondary | ICD-10-CM

## 2024-03-18 DIAGNOSIS — S21001A Unspecified open wound of right breast, initial encounter: Secondary | ICD-10-CM

## 2024-03-19 ENCOUNTER — Other Ambulatory Visit: Payer: Self-pay | Admitting: Family

## 2024-03-19 DIAGNOSIS — L598 Other specified disorders of the skin and subcutaneous tissue related to radiation: Secondary | ICD-10-CM | POA: Diagnosis not present

## 2024-03-23 ENCOUNTER — Other Ambulatory Visit: Payer: Self-pay | Admitting: Family

## 2024-03-25 NOTE — Telephone Encounter (Signed)
Please contact pt to schedule appointment.  

## 2024-03-28 NOTE — Telephone Encounter (Signed)
Lvm 2 schedule.

## 2024-04-03 ENCOUNTER — Ambulatory Visit: Admitting: Family

## 2024-04-03 VITALS — BP 130/53 | HR 54 | Temp 98.2°F | Resp 16 | Ht 61.0 in | Wt 213.0 lb

## 2024-04-03 DIAGNOSIS — M79671 Pain in right foot: Secondary | ICD-10-CM | POA: Diagnosis not present

## 2024-04-03 DIAGNOSIS — C50411 Malignant neoplasm of upper-outer quadrant of right female breast: Secondary | ICD-10-CM

## 2024-04-03 DIAGNOSIS — Z720 Tobacco use: Secondary | ICD-10-CM | POA: Diagnosis not present

## 2024-04-03 DIAGNOSIS — I10 Essential (primary) hypertension: Secondary | ICD-10-CM

## 2024-04-03 DIAGNOSIS — E559 Vitamin D deficiency, unspecified: Secondary | ICD-10-CM

## 2024-04-03 DIAGNOSIS — M79672 Pain in left foot: Secondary | ICD-10-CM

## 2024-04-03 MED ORDER — METOPROLOL SUCCINATE ER 100 MG PO TB24: 100.0000 mg | ORAL_TABLET | Freq: Every day | ORAL | 1 refills | Status: DC

## 2024-04-03 MED ORDER — ENALAPRIL MALEATE 20 MG PO TABS: 20.0000 mg | ORAL_TABLET | Freq: Every day | ORAL | 1 refills | Status: DC

## 2024-04-03 MED ORDER — CHLORTHALIDONE 25 MG PO TABS: ORAL_TABLET | ORAL | 1 refills | Status: DC

## 2024-04-03 NOTE — Assessment & Plan Note (Signed)
 Not currently on supplement. Will update.

## 2024-04-03 NOTE — Assessment & Plan Note (Signed)
 Following with Oncology

## 2024-04-03 NOTE — Patient Instructions (Signed)
 VISIT SUMMARY:  Today, we reviewed your blood pressure management, foot callus, wound healing, vitamin D levels, and general health maintenance. Your blood pressure remains well-controlled, and we discussed your use of beet juice. We also addressed your foot callus and wound healing progress.  YOUR PLAN:  FOOT CALLUS: You have a recurrent callus on your big toe and heel, which has been previously treated surgically. -We will refer you to a podiatrist for further evaluation and management.  WOUND HEALING: Your incision is healing well with your current holistic treatments. -Continue your current holistic wound care management.  HYPERTENSION: Your blood pressure is well-controlled with your current medications, enalapril  and metoprolol . -Continue taking enalapril  and metoprolol  as prescribed. -You may continue using beet juice if you find it beneficial.  VITAMIN D DEFICIENCY: You have a history of low vitamin D levels and have stopped taking supplements. -We will order a vitamin D level test to check your current levels.  GENERAL HEALTH MAINTENANCE: You are due for routine lung cancer screening, but you prefer to delay it for personal reasons. -We will discuss lung cancer screening at a future visit.  FOLLOW-UP: Routine follow-up and lab work are planned. -We will order tests to check your kidney function and cholesterol levels. -Schedule a follow-up appointment in six months.

## 2024-04-03 NOTE — Assessment & Plan Note (Signed)
 Declines CT for lung cancer screening.

## 2024-04-03 NOTE — Assessment & Plan Note (Addendum)
 BP stable on enalapril , chlorthalidone  and toprol  xl.  Continue same.

## 2024-04-03 NOTE — Progress Notes (Signed)
 Subjective:     Patient ID: Katie Jacobson, female    DOB: 1956-02-17, 68 y.o.   MRN: [id number]  Chief Complaint  Patient presents with   Hypertension    Here for follow up    HPI  Discussed the use of AI scribe software for clinical note transcription with the patient, who gave verbal consent to proceed.  History of Present Illness  Katie Jacobson is a 68 year old female with hypertension who presents for a follow-up visit to check her blood pressure. Her blood pressure remains well-controlled with enalapril  and metoprolol . She attributes additional control to the use of beet juice. She has a history of low vitamin D levels and has previously taken supplements. She experienced heart palpitations from high-dose magnesium , leading to its discontinuation. She occasionally uses Flonase  and prefers to have it available.      Health Maintenance Due  Topic Date Due   Medicare Annual Wellness (AWV)  Never done   COVID-19 Vaccine (7 - Pfizer risk 2024-25 season) 03/22/2024    Past Medical History:  Diagnosis Date   Allergy    Arthritis    Breast cancer of upper-outer quadrant of right female breast (HCC) 11/14/2014   Treated with chemotherapy and radiation (diagnosed again in March 2024 in both breasts)   Heart murmur    History of chemotherapy    finished chemo 02/03/2015   History of kidney stones    History of seizures    as a child - unknown cause - states was never on anticonvulsants   Hypertension    states under control with meds., has been on med. x years   Mitral regurgitation    Mitral valve prolapse 1990   Echo 2023 with no MVP. Mild-mod aortic regurg and mild mitral regurg. 3 yr followup recommended.   Osteoporosis    Personal history of chemotherapy    Personal history of radiation therapy    Port-A-Cath in place 01/24/2023   Seasonal allergies     Past Surgical History:  Procedure Laterality Date   ABDOMINAL HYSTERECTOMY  1998   partial    APPENDECTOMY  1976   BREAST BIOPSY Right 09/30/2022   US  RT BREAST BX W LOC DEV 1ST LESION IMG BX SPEC US  GUIDE 09/30/2022 [facility]   BREAST BIOPSY Left 09/30/2022   US  LT BREAST BX W LOC DEV 1ST LESION IMG BX SPEC US  GUIDE 09/30/2022 [facility]   BREAST BIOPSY Right 09/30/2022   US  RT BREAST BX W LOC DEV EA ADD LESION IMG BX SPEC US  GUIDE 09/30/2022 [facility]   BREAST BIOPSY  12/14/2022   MM LT RADIOACTIVE SEED LOC MAMMO GUIDE 12/14/2022 [facility]   BREAST LUMPECTOMY Right 02/2015   BREAST LUMPECTOMY WITH RADIOACTIVE SEED AND SENTINEL LYMPH NODE BIOPSY Left 12/15/2022   Procedure: LEFT BREAST LUMPECTOMY WITH RADIOACTIVE SEED AND AXILLARY SENTINEL LYMPH NODE BIOPSY;  Surgeon: Ebbie Cough, MD;  Location: [facility];  Service: General;  Laterality: Left;   COLONOSCOPY  09/30/2016   COMPLEX WOUND CLOSURE N/A 07/18/2023   Procedure: CLOSURE CHEST WALL WOUND X2;  Surgeon: Ebbie Cough, MD;  Location: [facility];  Service: General;  Laterality: N/A;   KNEE ARTHROSCOPY Left 03/26/2008   MASTECTOMY     MASTECTOMY W/ SENTINEL NODE BIOPSY Right 12/15/2022   Procedure: RIGHT MASTECTOMY WITH RIGHT AXILLARY SENTINEL LYMPH NODE BIOPSY;  Surgeon: Ebbie Cough, MD;  Location: [facility] OR;  Service: General;  Laterality: Right;  180 MIN ROOM 9  PLANTAR'S WART EXCISION Left    x 2   PORT-A-CATH REMOVAL Left 04/06/2015   Procedure: REMOVAL PORT-A-CATH;  Surgeon: Donnice Bury, MD;  Location: [facility];  Service: General;  Laterality: Left;   PORT-A-CATH REMOVAL N/A 07/18/2023   Procedure: REMOVAL PORT-A-CATH;  Surgeon: Bury Donnice, MD;  Location: [facility] OR;  Service: General;  Laterality: N/A;   PORTACATH PLACEMENT N/A 11/27/2014   Procedure: INSERTION PORT-A-CATH;  Surgeon: Donnice Bury, MD;  Location: [facility];  Service: General;  Laterality: N/A;   PORTACATH PLACEMENT Right 12/15/2022   Procedure: INSERTION PORT-A-CATH;   Surgeon: Bury Donnice, MD;  Location: [facility] OR;  Service: General;  Laterality: Right;   RADIOACTIVE SEED GUIDED PARTIAL MASTECTOMY WITH AXILLARY SENTINEL LYMPH NODE BIOPSY Right 03/02/2015   Procedure: RADIOACTIVE SEED GUIDED RIGHT PARTIAL MASTECTOMY WITH RIGHT AXILLARY SENTINEL LYMPH NODE BIOPSY;  Surgeon: Donnice Bury, MD;  Location: [facility];  Service: General;  Laterality: Right;   SALIVARY STONE REMOVAL Right 1972   TONSILLECTOMY  1970s   TRANSTHORACIC ECHOCARDIOGRAM  01/16/2020   EF 60 to 65%.  GRII DD.  No R WMA.  Normal RV size and function.  Rheumatic mitral valve with moderate thickening-hockey-stick appearing.  Moderate MR with mild MS.  Moderate LA dilation.  (Recommend follow-up in 2 to 3 years)   TRANSTHORACIC ECHOCARDIOGRAM  05/31/2022   Normal LV size and function-EF 60 to 65%.  No RWMA. ??  Normal diastolic parameters but elevated LAP?  Normal RV with normal RVP and RAP.  Manage mitral valve with mild MR and no MS.  Mild to moderate AI with aortic sclerosis but no stenosis.   TUBAL LIGATION  1996    Family History  Problem Relation Age of Onset   Heart disease Mother    Stroke Mother    Hypertension Mother    Diabetes Mother    Heart attack Father    Diabetes Sister    Diabetes Sister    Kidney disease Brother    Mental retardation Brother    Colon polyps Daughter    Cancer Cousin    Breast cancer Cousin        deceased 42   Colon cancer Neg Hx    Esophageal cancer Neg Hx    Rectal cancer Neg Hx    Stomach cancer Neg Hx     Social History   Socioeconomic History   Marital status: Married    Spouse name: Not on file   Number of children: 1   Years of education: Not on file   Highest education level: Not on file  Occupational History   Not on file  Tobacco Use   Smoking status: Some Days    Current packs/day: 0.50    Average packs/day: 0.5 packs/day for 43.0 years (21.5 ttl pk-yrs)    Types: Cigarettes   Smokeless tobacco: Never   Vaping Use   Vaping status: Never Used  Substance and Sexual Activity   Alcohol use: Yes    Alcohol/week: 5.0 standard drinks of alcohol    Types: 5 Standard drinks or equivalent per week    Comment: occ    Drug use: No   Sexual activity: Yes    Partners: Male  Other Topics Concern   Not on file  Social History Narrative   Married with 2 daughters- born 43 and 59 ([name] died in MVA 04/30/2016)   Previously worked -- Therapist, 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  Has been doing part time work with [employer] -- had 6 active clients, but the most important one is her Daughter.   Regular exercise:  No   Caffeine Use: none; Current 1ppd smoker x > 39 yrs; EtOH ~4 oz/week   Social Drivers of Corporate investment banker Strain: Not on file  Food Insecurity: No Food Insecurity (04/11/2023)   Hunger Vital Sign    Worried About Running Out of Food in the Last Year: Never true    Ran Out of Food in the Last Year: Never true  Transportation Needs: No Transportation Needs (04/11/2023)   PRAPARE - Administrator, Civil Service (Medical): No    Lack of Transportation (Non-Medical): No  Physical Activity: Not on file  Stress: Not on file  Social Connections: Unknown (02/22/2022)   Received from [facility]   Social Network    Social Network: Not on file  Intimate Partner Violence: Not At Risk (04/11/2023)   Humiliation, Afraid, Rape, and Kick questionnaire    Fear of Current or Ex-Partner: No    Emotionally Abused: No    Physically Abused: No    Sexually Abused: No    Outpatient Medications Prior to Visit  Medication Sig Dispense Refill   albuterol  (VENTOLIN  HFA) 108 (90 Base) MCG/ACT inhaler Inhale 1-2 puffs into the lungs every 6 (six) hours as needed for wheezing or shortness of breath. 1 each 0   aspirin  81 MG tablet Take 1 tablet (81 mg total) by mouth daily. 30 tablet 0   fluticasone  (FLONASE ) 50 MCG/ACT nasal spray Place into both nostrils daily.      chlorthalidone  (HYGROTON ) 25 MG tablet TAKE 1 TABLET(25 MG) BY MOUTH EVERY OTHER DAY 45 tablet 2   enalapril  (VASOTEC ) 20 MG tablet TAKE 1 TABLET BY MOUTH EVERYDAY AT BEDTIME 30 tablet 0   metoprolol  succinate (TOPROL -XL) 100 MG 24 hr tablet TAKE 1 TABLET BY MOUTH DAILY. TAKE WITH OR IMMEDIATELY FOLLOWING A MEAL. 30 tablet 0   gabapentin  (NEURONTIN ) 100 MG capsule Take 1 capsule by mouth at bedtime.     No facility-administered medications prior to visit.    Allergies  Allergen Reactions   Sulfa Antibiotics Other (See Comments)    UNKNOWN    ROS See HPI    Objective:    Physical Exam Constitutional:      General: She is not in acute distress.    Appearance: Normal appearance. She is well-developed.  HENT:     Head: Normocephalic and atraumatic.     Right Ear: External ear normal.     Left Ear: External ear normal.   Eyes:     General: No scleral icterus.  Neck:     Thyroid : No thyromegaly.   Cardiovascular:     Rate and Rhythm: Normal rate and regular rhythm.     Heart sounds: Normal heart sounds. No murmur heard. Pulmonary:     Effort: Pulmonary effort is normal. No respiratory distress.     Breath sounds: Normal breath sounds. No wheezing.   Musculoskeletal:     Cervical back: Neck supple.   Skin:    General: Skin is warm and dry.     Comments: Thick callous left great toe plantar surface   Neurological:     Mental Status: She is alert and oriented to person, place, and time.   Psychiatric:        Mood and Affect: Mood normal.        Behavior: Behavior normal.  Thought Content: Thought content normal.        Judgment: Judgment normal.      BP (!) 130/53 (BP Location: Left Arm, Patient Position: Sitting, Cuff Size: Normal)   Pulse (!) 54   Temp 98.2 F (36.8 C) (Oral)   Resp 16   Ht 5' 1 (1.549 m)   Wt 213 lb (96.6 kg)   LMP 10/10/1996   SpO2 100%   BMI 40.25 kg/m  Wt Readings from Last 3 Encounters:  04/03/24 213 lb (96.6 kg)  12/27/23  215 lb (97.5 kg)  09/22/23 211 lb (95.7 kg)       Assessment & Plan:   Problem List Items Addressed This Visit       Unprioritized   Tobacco abuse (Chronic)   Declines CT for lung cancer screening.        Essential hypertension - Primary (Chronic)   BP stable on enalapril , chlorthalidone  and toprol  xl.  Continue same.       Relevant Medications   metoprolol  succinate (TOPROL -XL) 100 MG 24 hr tablet   enalapril  (VASOTEC ) 20 MG tablet   chlorthalidone  (HYGROTON ) 25 MG tablet   Other Relevant Orders   Basic Metabolic Panel (BMET)   Vitamin D deficiency   Not currently on supplement. Will update.       Relevant Orders   Vitamin D (25 hydroxy)   Malignant neoplasm of upper-outer quadrant of right breast in female, estrogen receptor negative [facility])   Following with Oncology.       Other Visit Diagnoses       Pain in both feet       Relevant Orders   Vitamin D (25 hydroxy)   Ambulatory referral to Podiatry       I have discontinued Isela H. Capek's gabapentin . I have also changed her metoprolol  succinate and enalapril . Additionally, I am having her maintain her aspirin , albuterol , fluticasone , and chlorthalidone .  Meds ordered this encounter  Medications   metoprolol  succinate (TOPROL -XL) 100 MG 24 hr tablet    Sig: Take 1 tablet (100 mg total) by mouth daily. TAKE WITH OR IMMEDIATELY FOLLOWING A MEAL.    Dispense:  90 tablet    Refill:  1    Supervising Provider:   DOMENICA BLACKBIRD A [[id number]]   enalapril  (VASOTEC ) 20 MG tablet    Sig: Take 1 tablet (20 mg total) by mouth daily.    Dispense:  90 tablet    Refill:  1    Supervising Provider:   DOMENICA BLACKBIRD A [[id number]]   chlorthalidone  (HYGROTON ) 25 MG tablet    Sig: TAKE 1 TABLET(25 MG) BY MOUTH EVERY OTHER DAY    Dispense:  90 tablet    Refill:  1    Supervising Provider:   DOMENICA BLACKBIRD A [[id number]]

## 2024-04-04 LAB — BASIC METABOLIC PANEL WITH GFR
BUN: 16 mg/dL (ref 6–23)
CO2: 28 meq/L (ref 19–32)
Calcium: 9.5 mg/dL (ref 8.4–10.5)
Chloride: 103 meq/L (ref 96–112)
Creatinine, Ser: 1.01 mg/dL (ref 0.40–1.20)
GFR: 58.4 mL/min — ABNORMAL LOW (ref >60.00)
Glucose, Bld: 85 mg/dL (ref 70–99)
Potassium: 4.4 meq/L (ref 3.5–5.1)
Sodium: 140 meq/L (ref 135–145)

## 2024-04-04 LAB — VITAMIN D 25 HYDROXY (VIT D DEFICIENCY, FRACTURES): VITD: 10.31 ng/mL — ABNORMAL LOW (ref 30.00–100.00)

## 2024-04-05 ENCOUNTER — Ambulatory Visit: Payer: Self-pay | Admitting: Family

## 2024-04-05 DIAGNOSIS — E559 Vitamin D deficiency, unspecified: Secondary | ICD-10-CM

## 2024-04-05 MED ORDER — VITAMIN D (ERGOCALCIFEROL) 1.25 MG (50000 UNIT) PO CAPS
50000.0000 [IU] | ORAL_CAPSULE | ORAL | 0 refills | Status: DC
Start: 2024-04-05 — End: 2024-08-01

## 2024-04-05 NOTE — Telephone Encounter (Signed)
Vitamin D level is low.  Advise patient to begin vit D 50000 units once weekly for 12 weeks, then repeat vit D level (dx Vit D deficiency).     

## 2024-04-24 ENCOUNTER — Ambulatory Visit (INDEPENDENT_AMBULATORY_CARE_PROVIDER_SITE_OTHER): Admitting: Podiatry

## 2024-04-24 DIAGNOSIS — L989 Disorder of the skin and subcutaneous tissue, unspecified: Secondary | ICD-10-CM

## 2024-04-24 NOTE — Progress Notes (Signed)
 Subjective:  Patient ID: Katie Jacobson, female    DOB: 03/04/1956,  MRN: [id number]  Chief Complaint  Patient presents with   Callouses    68 y.o. female presents with the above complaint.  Patient presents with left hallux porokeratotic lesion/benign skin lesion.  Patient states been present for quite some time is progressive gotten worse worse with ambulation and shoe pressure patient would like to discuss treatment options for has not seen and was prior to seeing me for this.  Pain scale 7 out of 10 with no other acute nature   Review of Systems: Negative except as noted in the HPI. Denies N/V/F/Ch.  Past Medical History:  Diagnosis Date   Allergy    Arthritis    Breast cancer of upper-outer quadrant of right female breast (HCC) 11/14/2014   Treated with chemotherapy and radiation (diagnosed again in March 2024 in both breasts)   Heart murmur    History of chemotherapy    finished chemo 02/03/2015   History of kidney stones    History of seizures    as a child - unknown cause - states was never on anticonvulsants   Hypertension    states under control with meds., has been on med. x years   Mitral regurgitation    Mitral valve prolapse 1990   Echo 2023 with no MVP. Mild-mod aortic regurg and mild mitral regurg. 3 yr followup recommended.   Osteoporosis    Personal history of chemotherapy    Personal history of radiation therapy    Port-A-Cath in place 01/24/2023   Seasonal allergies     Current Outpatient Medications:    albuterol  (VENTOLIN  HFA) 108 (90 Base) MCG/ACT inhaler, Inhale 1-2 puffs into the lungs every 6 (six) hours as needed for wheezing or shortness of breath., Disp: 1 each, Rfl: 0   aspirin  81 MG tablet, Take 1 tablet (81 mg total) by mouth daily., Disp: 30 tablet, Rfl: 0   chlorthalidone  (HYGROTON ) 25 MG tablet, TAKE 1 TABLET(25 MG) BY MOUTH EVERY OTHER DAY, Disp: 90 tablet, Rfl: 1   enalapril  (VASOTEC ) 20 MG tablet, Take 1 tablet (20 mg total) by mouth  daily., Disp: 90 tablet, Rfl: 1   fluticasone  (FLONASE ) 50 MCG/ACT nasal spray, Place into both nostrils daily., Disp: , Rfl:    metoprolol  succinate (TOPROL -XL) 100 MG 24 hr tablet, Take 1 tablet (100 mg total) by mouth daily. TAKE WITH OR IMMEDIATELY FOLLOWING A MEAL., Disp: 90 tablet, Rfl: 1   Vitamin D , Ergocalciferol , (DRISDOL ) 1.25 MG (50000 UNIT) CAPS capsule, Take 1 capsule (50,000 Units total) by mouth every 7 (seven) days., Disp: 12 capsule, Rfl: 0  Social History   Tobacco Use  Smoking Status Some Days   Current packs/day: 0.50   Average packs/day: 0.5 packs/day for 43.0 years (21.5 ttl pk-yrs)   Types: Cigarettes  Smokeless Tobacco Never    Allergies  Allergen Reactions   Sulfa Antibiotics Other (See Comments)    UNKNOWN   Objective:  There were no vitals filed for this visit. There is no height or weight on file to calculate BMI. Constitutional Well developed. Well nourished.  Vascular Dorsalis pedis pulses palpable bilaterally. Posterior tibial pulses palpable bilaterally. Capillary refill normal to all digits.  No cyanosis or clubbing noted. Pedal hair growth normal.  Neurologic Normal speech. Oriented to person, place, and time. Epicritic sensation to light touch grossly present bilaterally.  Dermatologic Left hallux porokeratotic lesion with central nucleated core noted.  Pain on palpation to the lesion.  No pinpoint bleeding noted upon debridement  Orthopedic: Normal joint ROM without pain or crepitus bilaterally. No visible deformities. No bony tenderness.   Radiographs: None Assessment:   1. Benign skin lesion    Plan:  Patient was evaluated and treated and all questions answered.  Left hallux benign skin lesion - All questions or concerns were discussed with the patient excessive due to using chisel blade to handle the lesion was debride down to healthy stripe tissue no complication noted no pinpoint bleeding noted - Shoe gear modification  extensively discussed  No follow-ups on file.

## 2024-05-08 ENCOUNTER — Encounter (HOSPITAL_BASED_OUTPATIENT_CLINIC_OR_DEPARTMENT_OTHER): Attending: Internal Medicine | Admitting: Internal Medicine

## 2024-05-08 DIAGNOSIS — L598 Other specified disorders of the skin and subcutaneous tissue related to radiation: Secondary | ICD-10-CM | POA: Insufficient documentation

## 2024-05-08 DIAGNOSIS — C50911 Malignant neoplasm of unspecified site of right female breast: Secondary | ICD-10-CM | POA: Insufficient documentation

## 2024-05-08 DIAGNOSIS — L98492 Non-pressure chronic ulcer of skin of other sites with fat layer exposed: Secondary | ICD-10-CM | POA: Diagnosis not present

## 2024-05-08 DIAGNOSIS — T8131XA Disruption of external operation (surgical) wound, not elsewhere classified, initial encounter: Secondary | ICD-10-CM | POA: Diagnosis not present

## 2024-05-08 DIAGNOSIS — N611 Abscess of the breast and nipple: Secondary | ICD-10-CM | POA: Diagnosis not present

## 2024-05-08 DIAGNOSIS — C50912 Malignant neoplasm of unspecified site of left female breast: Secondary | ICD-10-CM | POA: Insufficient documentation

## 2024-05-08 DIAGNOSIS — S21001A Unspecified open wound of right breast, initial encounter: Secondary | ICD-10-CM | POA: Diagnosis not present

## 2024-05-08 DIAGNOSIS — X58XXXA Exposure to other specified factors, initial encounter: Secondary | ICD-10-CM | POA: Diagnosis not present

## 2024-05-09 DIAGNOSIS — L598 Other specified disorders of the skin and subcutaneous tissue related to radiation: Secondary | ICD-10-CM | POA: Diagnosis not present

## 2024-05-22 ENCOUNTER — Encounter (HOSPITAL_BASED_OUTPATIENT_CLINIC_OR_DEPARTMENT_OTHER): Attending: Internal Medicine | Admitting: Internal Medicine

## 2024-05-22 DIAGNOSIS — L598 Other specified disorders of the skin and subcutaneous tissue related to radiation: Secondary | ICD-10-CM | POA: Insufficient documentation

## 2024-05-22 DIAGNOSIS — X58XXXA Exposure to other specified factors, initial encounter: Secondary | ICD-10-CM | POA: Diagnosis not present

## 2024-05-22 DIAGNOSIS — Z923 Personal history of irradiation: Secondary | ICD-10-CM | POA: Insufficient documentation

## 2024-05-22 DIAGNOSIS — L98492 Non-pressure chronic ulcer of skin of other sites with fat layer exposed: Secondary | ICD-10-CM | POA: Insufficient documentation

## 2024-05-22 DIAGNOSIS — S21001A Unspecified open wound of right breast, initial encounter: Secondary | ICD-10-CM | POA: Diagnosis not present

## 2024-05-22 DIAGNOSIS — Z9221 Personal history of antineoplastic chemotherapy: Secondary | ICD-10-CM | POA: Diagnosis not present

## 2024-05-22 DIAGNOSIS — T8131XD Disruption of external operation (surgical) wound, not elsewhere classified, subsequent encounter: Secondary | ICD-10-CM | POA: Diagnosis not present

## 2024-05-22 DIAGNOSIS — N611 Abscess of the breast and nipple: Secondary | ICD-10-CM | POA: Diagnosis not present

## 2024-05-22 DIAGNOSIS — Z853 Personal history of malignant neoplasm of breast: Secondary | ICD-10-CM | POA: Diagnosis not present

## 2024-05-31 ENCOUNTER — Ambulatory Visit: Payer: Self-pay | Admitting: Cardiology

## 2024-05-31 ENCOUNTER — Ambulatory Visit (HOSPITAL_COMMUNITY)
Admission: RE | Admit: 2024-05-31 | Discharge: 2024-05-31 | Disposition: A | Discharge status: Home / Self Care | Payer: 59 | Source: Ambulatory Visit | Attending: Cardiology | Admitting: Cardiology

## 2024-05-31 DIAGNOSIS — I340 Nonrheumatic mitral (valve) insufficiency: Secondary | ICD-10-CM

## 2024-05-31 DIAGNOSIS — I341 Nonrheumatic mitral (valve) prolapse: Secondary | ICD-10-CM | POA: Diagnosis not present

## 2024-05-31 LAB — ECHOCARDIOGRAM COMPLETE
Area-P 1/2: 2.39 cm2
P 1/2 time: 540 ms
S' Lateral: 3.3 cm

## 2024-05-31 NOTE — Telephone Encounter (Signed)
 To call back need to schedule an appt in regards to recent echo.

## 2024-05-31 NOTE — Telephone Encounter (Signed)
-----   Message from Alm Clay sent at 05/31/2024  3:34 PM EDT ----- Echocardiogram results:  Current cardiogram shows pretty significant regurgitation of the mitral valve now that was not seen last evaluation.  Recommendation is to consider transesophageal echocardiogram.  (TEE) There is also evidence of moderate aortic regurgitation.  The left ventricle function appears to be normal as does the right coronary artery. The left atrium is significant dilated which goes along with the mitral regurgitation.  The original plan was for her to follow-up in November, but we should probably have her come in sooner to be seen and discussed the results of the echo as well as potentially scheduling TEE.  We also need to discuss symptoms etc, and plans going forward.  Alm Clay, MD    ----- Message ----- From: Interface, [employer] Sent: 05/31/2024   3:19 PM EDT To: Alm LELON Clay, MD

## 2024-06-03 NOTE — Telephone Encounter (Signed)
 Patient is returning phone call.

## 2024-06-04 NOTE — Telephone Encounter (Signed)
 Patient returned RN Sharon's call.

## 2024-06-04 NOTE — Telephone Encounter (Signed)
 Spoke to  patient result given. Patient states she is very anxious  now that she know the results. RN explain to her Dr Anner would like to see her before Nov 2025.   He would like to discuss results and she if she has in symptoms.  Patient states she is always tired. Offered an appointment  for 06/07/24 at 11:40 am.  She states she would prefer a later appointment . RN informed her next afternoon appt would be  late Sept 2025. Patient states she will come on 06/07/24.

## 2024-06-07 ENCOUNTER — Encounter: Payer: Self-pay | Admitting: Cardiology

## 2024-06-07 ENCOUNTER — Ambulatory Visit: Attending: Cardiology | Admitting: Cardiology

## 2024-06-07 ENCOUNTER — Other Ambulatory Visit: Payer: Self-pay | Admitting: Cardiology

## 2024-06-07 VITALS — BP 110/62 | HR 72 | Ht 61.0 in | Wt 217.7 lb

## 2024-06-07 DIAGNOSIS — I10 Essential (primary) hypertension: Secondary | ICD-10-CM

## 2024-06-07 DIAGNOSIS — R002 Palpitations: Secondary | ICD-10-CM | POA: Diagnosis not present

## 2024-06-07 DIAGNOSIS — E785 Hyperlipidemia, unspecified: Secondary | ICD-10-CM | POA: Diagnosis not present

## 2024-06-07 DIAGNOSIS — E669 Obesity, unspecified: Secondary | ICD-10-CM

## 2024-06-07 DIAGNOSIS — I341 Nonrheumatic mitral (valve) prolapse: Secondary | ICD-10-CM

## 2024-06-07 DIAGNOSIS — I340 Nonrheumatic mitral (valve) insufficiency: Secondary | ICD-10-CM

## 2024-06-07 NOTE — Assessment & Plan Note (Signed)
 We discussed concerns of weight this could could adversely affect her recovery from surgeries.  Hopefully she is now can recover from her surgery and can start getting back active again.

## 2024-06-07 NOTE — Assessment & Plan Note (Signed)
 Well-controlled blood pressure with appropriate afterload reduction: -Continue combination of enalapril  20 mg daily, chlorthalidone  25 mg daily and Toprol  XL 100 mg daily.

## 2024-06-07 NOTE — Assessment & Plan Note (Signed)
 Excellent control on beta-blocker-continue Toprol -XL 100 mg daily

## 2024-06-07 NOTE — Patient Instructions (Signed)
 Medication Instructions:  Your physician recommends that you continue on your current medications as directed. Please refer to the Current Medication list given to you today.  *If you need a refill on your cardiac medications before your next appointment, please call your pharmacy*  Lab Work: Your physician recommends that you have labs drawn today: BMET & CBC  If you have labs (blood work) drawn today and your tests are completely normal, you will receive your results only by: MyChart Message (if you have MyChart) OR A paper copy in the mail If you have any lab test that is abnormal or we need to change your treatment, we will call you to review the results.  Testing/Procedures: See below  Follow-Up: At [facility], you and your health needs are our priority.  As part of our continuing mission to provide you with exceptional heart care, our providers are all part of one team.  This team includes your primary Cardiologist (physician) and Advanced Practice Providers or APPs (Physician Assistants and Nurse Practitioners) who all work together to provide you with the care you need, when you need it.  Your next appointment:   2-3 week(s)  Provider:   Alm Clay, MD    We recommend signing up for the patient portal called MyChart.  Sign up information is provided on this After Visit Summary.  MyChart is used to connect with patients for Virtual Visits (Telemedicine).  Patients are able to view lab/test results, encounter notes, upcoming appointments, etc.  Non-urgent messages can be sent to your provider as well.   To learn more about what you can do with MyChart, go to [email or web address].   Other Instructions     You are scheduled for a TEE (Transesophageal Echocardiogram) on Friday, September 12 with Dr. Barbaraann.  Please arrive at the [facility] (Main Entrance A) at [facility]: [address] [place], [place] [address] at 9:30 AM (This time is 1 hour(s)  before your procedure to ensure your preparation).   Free valet parking service is available. You will check in at ADMITTING.   *Please Note: You will receive a call the day before your procedure to confirm the appointment time. That time may have changed from the original time based on the schedule for that day.*   DIET:  Nothing to eat or drink after midnight except a sip of water with medications (see medication instructions below)  MEDICATION INSTRUCTIONS: !!IF ANY NEW MEDICATIONS ARE STARTED AFTER TODAY, PLEASE NOTIFY YOUR PROVIDER AS SOON AS POSSIBLE!!  FYI: Medications such as Semaglutide (Ozempic, [place]), Tirzepatide (Mounjaro, Zepbound), Dulaglutide (Trulicity), etc (GLP1 agonists) AND Canagliflozin (Invokana), Dapagliflozin (Farxiga), Empagliflozin (Jardiance), Ertugliflozin (Steglatro), Bexagliflozin [employer]) or any combination with one of these drugs such as Invokamet (Canagliflozin/Metformin), Synjardy (Empagliflozin/Metformin), etc (SGLT2 inhibitors) must be held around the time of a procedure. This is not a comprehensive list of all of these drugs. Please review all of your medications and talk to your provider if you take any one of these. If you are not sure, ask your provider.    LABS: we will draw labs today (8/29)    FYI:  For your safety, and to allow us  to monitor your vital signs accurately during the surgery/procedure we request: If you have artificial nails, gel coating, SNS etc, please have those removed prior to your surgery/procedure. Not having the nail coverings /polish removed may result in cancellation or delay of your surgery/procedure.  Your support person will be asked to wait in the  waiting room during your procedure.  It is OK to have someone drop you off and come back when you are ready to be discharged.  You cannot drive after the procedure and will need someone to drive you home.  Bring your insurance cards.  *Special Note: Every effort is  made to have your procedure done on time. Occasionally there are emergencies that occur at the hospital that may cause delays. Please be patient if a delay does occur.

## 2024-06-07 NOTE — Progress Notes (Signed)
 Cardiology Office Note:  .   Date:  06/07/2024  ID:  [name], DOB Apr 11, 1956, MRN [id number] PCP: Daryl Setter, NP  [facility] Providers Cardiologist:  Alm Clay, MD     Chief Complaint  Patient presents with   Follow-up    To discuss test results-echocardiogram   Cardiac Valve Problem    Echo shows moderate to severe MR.  No dyspnea.    Patient Profile: .     RUCHI STONEY is a morbidly obese 68 y.o. female former smoker with a PMH notable for  BrCA (s/p lumpectomy and chemo/XRT in September 2016) HTN, HLD and palpitations as well as MVP with moderate MR  who presents here for early follow-up to discuss results of echocardiogram.    PMH: Valvular heart disease: Mild to moderate AI, moderate-severe MR with MVP. HTN HLD Right-sided breast cancer with right-sided mastectomy and left-sided lumpectomy followed by chemo XRT in 2016 with recurrence in 2024.     GHISLAINE HARCUM was last seen on September 01, 2023 as a routine follow-up.  She was doing well, recovering from her breast cancer treatments and postop complications.  Was stable Cardi standpoint.  Definitely has had fatigue and deconditioning issues but was pushing through.  She was due for 2-year follow-up echocardiogram this year and she now presents to discuss results.  Subjective  Discussed the use of AI scribe software for clinical note transcription with the patient, who gave verbal consent to proceed.  History of Present Illness SWAYZIE CHOATE is a 68 year old female with mitral valve regurgitation who presents for a surveillance study of her heart valve.  An echocardiogram was performed in August 2025 as part of her surveillance for mitral valve disease. She experiences fatigue but denies significant shortness of breath, chest pain, or palpitations. No leg swelling unless dehydrated. She occasionally experiences heart racing or skipping beats when consuming alcohol or chocolate,  which she does infrequently.  Her past medical history includes breast cancer, for which she completed treatment. She underwent surgery in March 2024 and has been visiting the wound center for areas that were not healing properly, which are now improving. Her port was removed in October 2024, and she is not on any maintenance therapy or medications like tamoxifen. She applies ointment to the surgical area as needed.  She denies shortness of breath when lying flat and has not experienced any chest pain or pressure. Her social history includes occasional alcohol consumption, specifically gin and juice, and she avoids sweets. She has been feeling stressed due to her medical condition and recent health concerns.    Objective   Medications: Aspirin  81 mg daily, chlorthalidone  25 mg daily, enalapril  20 mg daily, Toprol -XL 100 mg daily; Flonase  and albuterol  as needed allergies.  Studies Reviewed: SABRA   EKG Interpretation Date/Time:  Friday June 07 2024 12:07:40 EDT Ventricular Rate:  72 PR Interval:  156 QRS Duration:  72 QT Interval:  384 QTC Calculation: 420 R Axis:   75  Text Interpretation: Sinus rhythm with marked sinus arrhythmia with occasional Premature ventricular complexes When compared with ECG of 01-Sep-2023 13:28, Premature ventricular complexes are now Present Premature atrial complexes are no longer Present T wave inversion no longer evident in Inferior leads Confirmed by Clay Alm ([id number]) on 06/07/2024 12:20:48 PM     Lab Results  Component Value Date   CHOL 199 09/22/2023   HDL 75 09/22/2023   LDLCALC 109 (H) 09/22/2023   TRIG 64  09/22/2023   CHOLHDL 2.7 09/22/2023   Lab Results  Component Value Date   NA 140 04/03/2024   K 4.4 04/03/2024   CREATININE 1.01 04/03/2024   GFR 58.40 (L) 04/03/2024   GLUCOSE 85 04/03/2024   No results found for: HGBA1C Lab Results  Component Value Date   WBC 4.5 07/12/2023   HGB 13.6 07/12/2023   HCT 41.5 07/12/2023   MCV  94.1 07/12/2023   PLT 259 07/12/2023   ECHO (05/31/2024): Rheumatic mitral valve with moderate to severe MR now present.  No MS.  Severe LA dilation.  Consider TEE to quantify MR.  Mean mitral gradient is 2.5 mmHg (heart rate 63 bpm.); normal LV size and function EF 55 to 60%.  No RWMA.  Unable to assess diastolic parameters.  Mildly dilated RV with normal PAP.  Normal RAP  Previous Echo 05/2022: Rheumatic mitral valve with mild MR.  EF 60 to 65%.  No RWMA.  Elevated LVEDP.  AV sclerosis with no stenosis but mild to moderate AI.  Normal RAP.  Risk Assessment/Calculations:             Physical Exam:   VS:  BP 110/62 (BP Location: Right Wrist, Patient Position: Sitting)   Pulse 72   Ht 5' 1 (1.549 m)   Wt 217 lb 11.2 oz (98.7 kg)   LMP 10/10/1996   SpO2 96%   BMI 41.13 kg/m    Wt Readings from Last 3 Encounters:  06/07/24 217 lb 11.2 oz (98.7 kg)  04/03/24 213 lb (96.6 kg)  12/27/23 215 lb (97.5 kg)    Physical Exam CARDIOVASCULAR: No heart murmur. Heart palpitations detected on auscultation. MUSCULOSKELETAL: No clubbing, cyanosis, or edema. Normal pedal pulses.   GEN: Well nourished, well groomed in no acute distress; healthy-appearing. NECK: No JVD; No carotid bruits CARDIAC: Very distant heart sounds.  Difficult to assess but appears to have normal S1 and S2.  Soft harsh 1/6 SEM at RUSB and soft 1/6 HSM at apex.  No rubs or gallops.   RESPIRATORY:  Clear to auscultation without rales, wheezing or rhonchi ; nonlabored, good air movement. ABDOMEN: Soft, non-tender, non-distended EXTREMITIES:  No edema; No deformity      ASSESSMENT AND PLAN: .    Problem List Items Addressed This Visit       Cardiology Problems   Essential hypertension (Chronic)   Well-controlled blood pressure with appropriate afterload reduction: -Continue combination of enalapril  20 mg daily, chlorthalidone  25 mg daily and Toprol  XL 100 mg daily.      Hyperlipidemia LDL goal <100 (Chronic)   Last  check on labs showed LDL was 109 as of December 2024.  She is not currently on any medications.  For now we will hold off on further evaluation until we have cleared up the issue of her valve.      Mitral regurgitation due to cusp prolapse (Chronic)   Much crystallization now being read as possibly moderate to severe which is a progression from mild regurgitation back in 11/29/2021.  Recommendation from the reader is to consider TEE for better characterization of the valve. Transthoracic images were suboptimal, possibly overestimating severity. She is asymptomatic, which is atypical for this level of regurgitation. - Schedule transesophageal echocardiogram (TEE) with structural heart specialist. - Monitor for symptoms such as shortness of breath and report changes. - If TEE confirms significant regurgitation, consider right heart catheterization to assess pulmonary pressures and coronary artery disease. - We spent time discussing surgical or interventional options  if significant regurgitation is confirmed and symptomatic.  Schedule TEE with structural heart team imaging cardiologist => based on results, would consider either continued surveillance versus proceeding with right and left heart catheterization and referral to Valve Clinic/CVTS      Relevant Orders   Basic metabolic panel with GFR   CBC     Other   Intermittent palpitations (Chronic)   Excellent control on beta-blocker-continue Toprol -XL 100 mg daily      Obesity (BMI 35.0-39.9 without comorbidity) (Chronic)   We discussed concerns of weight this could could adversely affect her recovery from surgeries.  Hopefully she is now can recover from her surgery and can start getting back active again.      Other Visit Diagnoses       Mitral valve prolapse    -  Primary   Relevant Orders   EKG 12-Lead (Completed)   Basic metabolic panel with GFR   CBC       I did spend a long time talking with her, discussing her recent  journey with breast cancer recurrence and now having a new finding that is concerning.  I did indicate that her lack of symptoms makes it somewhat reassuring that we may not need to be going straight to surgery.  She would like to take this slowly as long as she is not having symptoms.     Informed Consent   Shared Decision Making/Informed Consent   The risks [esophageal damage, perforation (1:10,000 risk), bleeding, pharyngeal hematoma as well as other potential complications associated with conscious sedation including aspiration, arrhythmia, respiratory failure and death], benefits (treatment guidance and diagnostic support) and alternatives of a transesophageal echocardiogram were discussed in detail with Ms. Radoncic and she is willing to proceed.       Follow-Up: Return in about 6 weeks (around 07/19/2024) for To discuss test results.  I spent 58 minutes in the care of ASPYNN CLOVER today including reviewing labs (KPN and epic-2 minutes), reviewing studies (personally reviewed the echo films and discussed results with Dr. Darryle O'Neal-10 minutes), face to face time discussing treatment options (44 minutes), reviewing records from previous notes (6 minutes), \67, and documenting in the encounter.      Signed, Alm MICAEL Clay, MD, MS Alm Clay, M.D., M.S. Interventional Chartered certified accountant  Pager # [phone]

## 2024-06-07 NOTE — Assessment & Plan Note (Signed)
 Last check on labs showed LDL was 109 as of December 2024.  She is not currently on any medications.  For now we will hold off on further evaluation until we have cleared up the issue of her valve.

## 2024-06-07 NOTE — Assessment & Plan Note (Signed)
 Much crystallization now being read as possibly moderate to severe which is a progression from mild regurgitation back in 11/29/2021.  Recommendation from the reader is to consider TEE for better characterization of the valve. Transthoracic images were suboptimal, possibly overestimating severity. She is asymptomatic, which is atypical for this level of regurgitation. - Schedule transesophageal echocardiogram (TEE) with structural heart specialist. - Monitor for symptoms such as shortness of breath and report changes. - If TEE confirms significant regurgitation, consider right heart catheterization to assess pulmonary pressures and coronary artery disease. - We spent time discussing surgical or interventional options if significant regurgitation is confirmed and symptomatic.  Schedule TEE with structural heart team imaging cardiologist => based on results, would consider either continued surveillance versus proceeding with right and left heart catheterization and referral to Valve Clinic/CVTS

## 2024-06-07 NOTE — H&P (View-Only) (Signed)
 Cardiology Office Note:  .   Date:  06/07/2024  ID:  Katie Jacobson, DOB Apr 11, 1956, MRN [id number] PCP: Daryl Setter, NP  [facility] Providers Cardiologist:  Alm Clay, MD     Chief Complaint  Patient presents with   Follow-up    To discuss test results-echocardiogram   Cardiac Valve Problem    Echo shows moderate to severe MR.  No dyspnea.    Patient Profile: .     Katie Jacobson is a morbidly obese 68 y.o. female former smoker with a PMH notable for  BrCA (s/p lumpectomy and chemo/XRT in September 2016) HTN, HLD and palpitations as well as MVP with moderate MR  who presents here for early follow-up to discuss results of echocardiogram.    PMH: Valvular heart disease: Mild to moderate AI, moderate-severe MR with MVP. HTN HLD Right-sided breast cancer with right-sided mastectomy and left-sided lumpectomy followed by chemo XRT in 2016 with recurrence in 2024.     Katie Jacobson was last seen on September 01, 2023 as a routine follow-up.  She was doing well, recovering from her breast cancer treatments and postop complications.  Was stable Cardi standpoint.  Definitely has had fatigue and deconditioning issues but was pushing through.  She was due for 2-year follow-up echocardiogram this year and she now presents to discuss results.  Subjective  Discussed the use of AI scribe software for clinical note transcription with the patient, who gave verbal consent to proceed.  History of Present Illness Katie Jacobson is a 68 year old female with mitral valve regurgitation who presents for a surveillance study of her heart valve.  An echocardiogram was performed in August 2025 as part of her surveillance for mitral valve disease. She experiences fatigue but denies significant shortness of breath, chest pain, or palpitations. No leg swelling unless dehydrated. She occasionally experiences heart racing or skipping beats when consuming alcohol or chocolate,  which she does infrequently.  Her past medical history includes breast cancer, for which she completed treatment. She underwent surgery in March 2024 and has been visiting the wound center for areas that were not healing properly, which are now improving. Her port was removed in October 2024, and she is not on any maintenance therapy or medications like tamoxifen. She applies ointment to the surgical area as needed.  She denies shortness of breath when lying flat and has not experienced any chest pain or pressure. Her social history includes occasional alcohol consumption, specifically gin and juice, and she avoids sweets. She has been feeling stressed due to her medical condition and recent health concerns.    Objective   Medications: Aspirin  81 mg daily, chlorthalidone  25 mg daily, enalapril  20 mg daily, Toprol -XL 100 mg daily; Flonase  and albuterol  as needed allergies.  Studies Reviewed: Katie Jacobson   EKG Interpretation Date/Time:  Friday June 07 2024 12:07:40 EDT Ventricular Rate:  72 PR Interval:  156 QRS Duration:  72 QT Interval:  384 QTC Calculation: 420 R Axis:   75  Text Interpretation: Sinus rhythm with marked sinus arrhythmia with occasional Premature ventricular complexes When compared with ECG of 01-Sep-2023 13:28, Premature ventricular complexes are now Present Premature atrial complexes are no longer Present T wave inversion no longer evident in Inferior leads Confirmed by Clay Alm ([id number]) on 06/07/2024 12:20:48 PM     Lab Results  Component Value Date   CHOL 199 09/22/2023   HDL 75 09/22/2023   LDLCALC 109 (H) 09/22/2023   TRIG 64  09/22/2023   CHOLHDL 2.7 09/22/2023   Lab Results  Component Value Date   NA 140 04/03/2024   K 4.4 04/03/2024   CREATININE 1.01 04/03/2024   GFR 58.40 (L) 04/03/2024   GLUCOSE 85 04/03/2024   No results found for: HGBA1C Lab Results  Component Value Date   WBC 4.5 07/12/2023   HGB 13.6 07/12/2023   HCT 41.5 07/12/2023   MCV  94.1 07/12/2023   PLT 259 07/12/2023   ECHO (05/31/2024): Rheumatic mitral valve with moderate to severe MR now present.  No MS.  Severe LA dilation.  Consider TEE to quantify MR.  Mean mitral gradient is 2.5 mmHg (heart rate 63 bpm.); normal LV size and function EF 55 to 60%.  No RWMA.  Unable to assess diastolic parameters.  Mildly dilated RV with normal PAP.  Normal RAP  Previous Echo 05/2022: Rheumatic mitral valve with mild MR.  EF 60 to 65%.  No RWMA.  Elevated LVEDP.  AV sclerosis with no stenosis but mild to moderate AI.  Normal RAP.  Risk Assessment/Calculations:             Physical Exam:   VS:  BP 110/62 (BP Location: Right Wrist, Patient Position: Sitting)   Pulse 72   Ht 5' 1 (1.549 m)   Wt 217 lb 11.2 oz (98.7 kg)   LMP 10/10/1996   SpO2 96%   BMI 41.13 kg/m    Wt Readings from Last 3 Encounters:  06/07/24 217 lb 11.2 oz (98.7 kg)  04/03/24 213 lb (96.6 kg)  12/27/23 215 lb (97.5 kg)    Physical Exam CARDIOVASCULAR: No heart murmur. Heart palpitations detected on auscultation. MUSCULOSKELETAL: No clubbing, cyanosis, or edema. Normal pedal pulses.   GEN: Well nourished, well groomed in no acute distress; healthy-appearing. NECK: No JVD; No carotid bruits CARDIAC: Very distant heart sounds.  Difficult to assess but appears to have normal S1 and S2.  Soft harsh 1/6 SEM at RUSB and soft 1/6 HSM at apex.  No rubs or gallops.   RESPIRATORY:  Clear to auscultation without rales, wheezing or rhonchi ; nonlabored, good air movement. ABDOMEN: Soft, non-tender, non-distended EXTREMITIES:  No edema; No deformity      ASSESSMENT AND PLAN: .    Problem List Items Addressed This Visit       Cardiology Problems   Essential hypertension (Chronic)   Well-controlled blood pressure with appropriate afterload reduction: -Continue combination of enalapril  20 mg daily, chlorthalidone  25 mg daily and Toprol  XL 100 mg daily.      Hyperlipidemia LDL goal <100 (Chronic)   Last  check on labs showed LDL was 109 as of December 2024.  She is not currently on any medications.  For now we will hold off on further evaluation until we have cleared up the issue of her valve.      Mitral regurgitation due to cusp prolapse (Chronic)   Much crystallization now being read as possibly moderate to severe which is a progression from mild regurgitation back in 11/29/2021.  Recommendation from the reader is to consider TEE for better characterization of the valve. Transthoracic images were suboptimal, possibly overestimating severity. She is asymptomatic, which is atypical for this level of regurgitation. - Schedule transesophageal echocardiogram (TEE) with structural heart specialist. - Monitor for symptoms such as shortness of breath and report changes. - If TEE confirms significant regurgitation, consider right heart catheterization to assess pulmonary pressures and coronary artery disease. - We spent time discussing surgical or interventional options  if significant regurgitation is confirmed and symptomatic.  Schedule TEE with structural heart team imaging cardiologist => based on results, would consider either continued surveillance versus proceeding with right and left heart catheterization and referral to Valve Clinic/CVTS      Relevant Orders   Basic metabolic panel with GFR   CBC     Other   Intermittent palpitations (Chronic)   Excellent control on beta-blocker-continue Toprol -XL 100 mg daily      Obesity (BMI 35.0-39.9 without comorbidity) (Chronic)   We discussed concerns of weight this could could adversely affect her recovery from surgeries.  Hopefully she is now can recover from her surgery and can start getting back active again.      Other Visit Diagnoses       Mitral valve prolapse    -  Primary   Relevant Orders   EKG 12-Lead (Completed)   Basic metabolic panel with GFR   CBC       I did spend a long time talking with her, discussing her recent  journey with breast cancer recurrence and now having a new finding that is concerning.  I did indicate that her lack of symptoms makes it somewhat reassuring that we may not need to be going straight to surgery.  She would like to take this slowly as long as she is not having symptoms.     Informed Consent   Shared Decision Making/Informed Consent   The risks [esophageal damage, perforation (1:10,000 risk), bleeding, pharyngeal hematoma as well as other potential complications associated with conscious sedation including aspiration, arrhythmia, respiratory failure and death], benefits (treatment guidance and diagnostic support) and alternatives of a transesophageal echocardiogram were discussed in detail with Ms. Radoncic and she is willing to proceed.       Follow-Up: Return in about 6 weeks (around 07/19/2024) for To discuss test results.  I spent 58 minutes in the care of Katie Jacobson today including reviewing labs (KPN and epic-2 minutes), reviewing studies (personally reviewed the echo films and discussed results with Dr. Darryle O'Neal-10 minutes), face to face time discussing treatment options (44 minutes), reviewing records from previous notes (6 minutes), \67, and documenting in the encounter.      Signed, Alm MICAEL Clay, MD, MS Alm Clay, M.D., M.S. Interventional Chartered certified accountant  Pager # [phone]

## 2024-06-08 ENCOUNTER — Ambulatory Visit: Payer: Self-pay | Admitting: Cardiology

## 2024-06-08 LAB — CBC
Hematocrit: 43.8 % (ref 34.0–46.6)
Hemoglobin: 14.6 g/dL (ref 11.1–15.9)
MCH: 32.3 pg (ref 26.6–33.0)
MCHC: 33.3 g/dL (ref 31.5–35.7)
MCV: 97 fL (ref 79–97)
Platelets: 219 x10E3/uL (ref 150–450)
RBC: 4.52 x10E6/uL (ref 3.77–5.28)
RDW: 14.5 % (ref 11.7–15.4)
WBC: 6 x10E3/uL (ref 3.4–10.8)

## 2024-06-08 LAB — BASIC METABOLIC PANEL WITH GFR
BUN/Creatinine Ratio: 15 (ref 12–28)
BUN: 14 mg/dL (ref 8–27)
CO2: 26 mmol/L (ref 20–29)
Calcium: 9.7 mg/dL (ref 8.7–10.3)
Chloride: 101 mmol/L (ref 96–106)
Creatinine, Ser: 0.95 mg/dL (ref 0.57–1.00)
Glucose: 85 mg/dL (ref 70–99)
Potassium: 4.4 mmol/L (ref 3.5–5.2)
Sodium: 142 mmol/L (ref 134–144)
eGFR: 66 mL/min/1.73 (ref >59)

## 2024-06-18 ENCOUNTER — Telehealth: Payer: Self-pay | Admitting: Cardiology

## 2024-06-18 NOTE — Telephone Encounter (Signed)
 per v/o  from Dr Anner ,   Postpone Dental procedure until after follow up appointment with Dr Anner.  Patient is aware . She states she will call the dental office an cancel procedure.

## 2024-06-18 NOTE — Telephone Encounter (Signed)
 I spoke with patient.  She is scheduled for TEE on 9/12.  Patient is also scheduled for dental procedure on 9/11.  It is not an emergency procedure.  She had half of the work done last month and other half is scheduled for 9/11.  Patient reports dental procedure involves splitting gums and cleaning.  States it is painful when she has done.  Has to take amoxicillin  4 tabs prior to appointment.   Patient is asking if OK to have dental work done on 9/11 or if she should reschedule it.  Will forward to Dr Anner for recommendations.

## 2024-06-18 NOTE — Telephone Encounter (Signed)
 Patient has a dental procedure on 9/11 and wondering if she should reschedule procedure for our office on 9/12. Please advise

## 2024-06-20 NOTE — Anesthesia Preprocedure Evaluation (Addendum)
 Anesthesia Evaluation  Patient identified by MRN, date of birth, ID band Patient awake    Reviewed: Allergy & Precautions, NPO status , Patient's Chart, lab work & pertinent test results, reviewed documented beta blocker date and time   Airway Mallampati: II  TM Distance: >3 FB Neck ROM: Full    Dental no notable dental hx. (+) Teeth Intact, Dental Advisory Given   Pulmonary asthma , Current Smoker and Patient abstained from smoking.   Pulmonary exam normal breath sounds clear to auscultation       Cardiovascular hypertension, Pt. on home beta blockers and Pt. on medications Normal cardiovascular exam+ Valvular Problems/Murmurs MVP, MR and AI  Rhythm:Regular Rate:Normal  TTE 2025 1. Moderate to severe mitral regurgitation is now present. No stenosis.  Would consider TEE for quantification of MR. The mitral valve is  rheumatic. Moderate to severe mitral valve regurgitation. The mean mitral  valve gradient is 2.5 mmHg with average  heart rate of 63 bpm.   2. Left ventricular ejection fraction, by estimation, is 55 to 60%. The  left ventricle has normal function. The left ventricle has no regional  wall motion abnormalities. Left ventricular diastolic function could not  be evaluated.   3. Right ventricular systolic function is normal. The right ventricular  size is mildly enlarged. There is normal pulmonary artery systolic  pressure. The estimated right ventricular systolic pressure is 31.3 mmHg.   4. Left atrial size was severely dilated.   5. The aortic valve is tricuspid. Aortic valve regurgitation is moderate.  No aortic stenosis is present.   6. The inferior vena cava is normal in size with greater than 50%  respiratory variability, suggesting right atrial pressure of 3 mmHg.     Neuro/Psych Seizures -, Well Controlled,  negative neurological ROS  negative psych ROS   GI/Hepatic negative GI ROS, Neg liver ROS,,,   Endo/Other  negative endocrine ROS  Class 3 obesity (BMI 41)  Renal/GU negative Renal ROS  negative genitourinary   Musculoskeletal negative musculoskeletal ROS (+) Arthritis ,    Abdominal   Peds  Hematology negative hematology ROS (+)   Anesthesia Other Findings   Reproductive/Obstetrics                              Anesthesia Physical Anesthesia Plan  ASA: 3  Anesthesia Plan: MAC   Post-op Pain Management:    Induction: Intravenous  PONV Risk Score and Plan: Propofol  infusion and Treatment may vary due to age or medical condition  Airway Management Planned: Natural Airway  Additional Equipment:   Intra-op Plan:   Post-operative Plan:   Informed Consent: I have reviewed the patients History and Physical, chart, labs and discussed the procedure including the risks, benefits and alternatives for the proposed anesthesia with the patient or authorized representative who has indicated his/her understanding and acceptance.     Dental advisory given  Plan Discussed with: CRNA  Anesthesia Plan Comments:          Anesthesia Quick Evaluation

## 2024-06-20 NOTE — Progress Notes (Signed)
 Spoke to patient and instructed them to come at 0930  and to be NPO after 0000.     Confirmed that patient will have a ride home and someone to stay with them for 24 hours after the procedure.   Medications reviewed.  Confirmed blood thinner.  Confirmed no breaks in taking blood thinner for 3+ weeks prior to procedure. Confirmed patient stopped all GLP-1s and GLP-2s for at least one week before procedure.

## 2024-06-21 ENCOUNTER — Ambulatory Visit (HOSPITAL_COMMUNITY)

## 2024-06-21 ENCOUNTER — Encounter (HOSPITAL_COMMUNITY)
Admission: RE | Disposition: A | Discharge status: Home / Self Care | Payer: Self-pay | Source: Home / Self Care | Attending: Cardiovascular Disease

## 2024-06-21 ENCOUNTER — Other Ambulatory Visit: Payer: Self-pay

## 2024-06-21 ENCOUNTER — Ambulatory Visit (HOSPITAL_COMMUNITY)
Admission: RE | Admit: 2024-06-21 | Discharge: 2024-06-21 | Disposition: A | Discharge status: Home / Self Care | Attending: Cardiovascular Disease | Admitting: Cardiovascular Disease

## 2024-06-21 ENCOUNTER — Ambulatory Visit (HOSPITAL_BASED_OUTPATIENT_CLINIC_OR_DEPARTMENT_OTHER): Payer: Self-pay | Admitting: Anesthesiology

## 2024-06-21 ENCOUNTER — Encounter (HOSPITAL_COMMUNITY): Payer: Self-pay | Admitting: Anesthesiology

## 2024-06-21 DIAGNOSIS — Z87891 Personal history of nicotine dependence: Secondary | ICD-10-CM | POA: Insufficient documentation

## 2024-06-21 DIAGNOSIS — Z9221 Personal history of antineoplastic chemotherapy: Secondary | ICD-10-CM | POA: Insufficient documentation

## 2024-06-21 DIAGNOSIS — Z6841 Body Mass Index (BMI) 40.0 and over, adult: Secondary | ICD-10-CM | POA: Insufficient documentation

## 2024-06-21 DIAGNOSIS — Z79899 Other long term (current) drug therapy: Secondary | ICD-10-CM | POA: Insufficient documentation

## 2024-06-21 DIAGNOSIS — Z853 Personal history of malignant neoplasm of breast: Secondary | ICD-10-CM | POA: Insufficient documentation

## 2024-06-21 DIAGNOSIS — I10 Essential (primary) hypertension: Secondary | ICD-10-CM | POA: Insufficient documentation

## 2024-06-21 DIAGNOSIS — I340 Nonrheumatic mitral (valve) insufficiency: Secondary | ICD-10-CM | POA: Diagnosis not present

## 2024-06-21 DIAGNOSIS — I080 Rheumatic disorders of both mitral and aortic valves: Secondary | ICD-10-CM | POA: Diagnosis not present

## 2024-06-21 DIAGNOSIS — Z923 Personal history of irradiation: Secondary | ICD-10-CM | POA: Diagnosis not present

## 2024-06-21 DIAGNOSIS — R569 Unspecified convulsions: Secondary | ICD-10-CM | POA: Diagnosis not present

## 2024-06-21 DIAGNOSIS — I341 Nonrheumatic mitral (valve) prolapse: Secondary | ICD-10-CM

## 2024-06-21 DIAGNOSIS — E785 Hyperlipidemia, unspecified: Secondary | ICD-10-CM | POA: Insufficient documentation

## 2024-06-21 DIAGNOSIS — F17210 Nicotine dependence, cigarettes, uncomplicated: Secondary | ICD-10-CM | POA: Diagnosis not present

## 2024-06-21 DIAGNOSIS — Z7982 Long term (current) use of aspirin: Secondary | ICD-10-CM | POA: Diagnosis not present

## 2024-06-21 HISTORY — PX: TRANSESOPHAGEAL ECHOCARDIOGRAM (CATH LAB): EP1270

## 2024-06-21 LAB — ECHO TEE
AR max vel: 2 cm2
AV Area VTI: 2.02 cm2
AV Area mean vel: 1.88 cm2
AV Mean grad: 7 mmHg
AV Peak grad: 15.4 mmHg
Ao pk vel: 1.96 m/s
Area-P 1/2: 1.8 cm2

## 2024-06-21 SURGERY — TRANSESOPHAGEAL ECHOCARDIOGRAM (TEE) (CATHLAB)
Anesthesia: Monitor Anesthesia Care

## 2024-06-21 MED ORDER — PROPOFOL 10 MG/ML IV BOLUS
INTRAVENOUS | Status: DC | PRN
  Administered 2024-06-21: 50 mg via INTRAVENOUS
  Administered 2024-06-21: 40 mg via INTRAVENOUS

## 2024-06-21 MED ORDER — SODIUM CHLORIDE 0.9 % IV SOLN: INTRAVENOUS | Status: DC

## 2024-06-21 MED ORDER — LACTATED RINGERS IV SOLN
INTRAVENOUS | Status: DC | PRN
Start: 2024-06-21 — End: 2024-06-21

## 2024-06-21 MED ORDER — ALBUTEROL SULFATE HFA 108 (90 BASE) MCG/ACT IN AERS
INHALATION_SPRAY | RESPIRATORY_TRACT | Status: DC | PRN
Start: 2024-06-21 — End: 2024-06-21
  Administered 2024-06-21: 4 via RESPIRATORY_TRACT

## 2024-06-21 MED ORDER — PROPOFOL 500 MG/50ML IV EMUL
INTRAVENOUS | Status: DC | PRN
  Administered 2024-06-21: 150 ug/kg/min via INTRAVENOUS

## 2024-06-21 MED ORDER — PHENYLEPHRINE 80 MCG/ML (10ML) SYRINGE FOR IV PUSH (FOR BLOOD PRESSURE SUPPORT)
PREFILLED_SYRINGE | INTRAVENOUS | Status: DC | PRN
  Administered 2024-06-21 (×2): 160 ug via INTRAVENOUS

## 2024-06-21 NOTE — Progress Notes (Signed)
 Echocardiogram Echocardiogram Transesophageal has been performed.  Katie Jacobson 06/21/2024, 11:29 AM

## 2024-06-21 NOTE — CV Procedure (Signed)
    TRANSESOPHAGEAL ECHOCARDIOGRAM   NAME:  Katie Jacobson    MRN: [id number] DOB:  01/27/1956    ADMIT DATE: 06/21/2024  INDICATIONS: Mitral regurgitation   PROCEDURE:   Informed consent was obtained prior to the procedure. The risks, benefits and alternatives for the procedure were discussed and the patient comprehended these risks.  Risks include, but are not limited to, cough, sore throat, vomiting, nausea, somnolence, esophageal and stomach trauma or perforation, bleeding, low blood pressure, aspiration, pneumonia, infection, trauma to the teeth and death.    Procedural time out performed. The oropharynx was anesthetized with topical 1% benzocaine.    Anesthesia was administered by Dr. Niels.  The patient was administered 530 mg of propofol  and 0 mg of lidocaine  to achieve and maintain moderate conscious sedation.  The patient's heart rate, blood pressure, and oxygen saturation are monitored continuously during the procedure. The period of conscious sedation is 29 minutes, of which I was present face-to-face 100% of this time.   The transesophageal probe was inserted in the esophagus and stomach without difficulty and multiple views were obtained.   COMPLICATIONS:    There were no immediate complications.  KEY FINDINGS:  Severe MR, rheumatic MV disease.  Mild to moderate mitral stenosis.  Severe AI.  Normal LVEF 55-60%. Negative bubble study.  Full report to follow. Further management per primary team.   Signed, Darryle DASEN. Barbaraann, MD, [facility]  [facility]  [address] [place], [place] [address] [phone]  11:14 AM

## 2024-06-21 NOTE — Discharge Instructions (Signed)

## 2024-06-21 NOTE — Interval H&P Note (Signed)
 History and Physical Interval Note:  06/21/2024 10:20 AM  Katie Jacobson  has presented today for surgery, with the diagnosis of sever mitral valve regurgitation.  The various methods of treatment have been discussed with the patient and family. After consideration of risks, benefits and other options for treatment, the patient has consented to  Procedure(s): TRANSESOPHAGEAL ECHOCARDIOGRAM (N/A) as a surgical intervention.  The patient's history has been reviewed, patient examined, no change in status, stable for surgery.  I have reviewed the patient's chart and labs.  Questions were answered to the patient's satisfaction.    NPO for TEE. MR on TTE  Signed, Trayquan Kolakowski T. Barbaraann, MD, [facility]  [facility]  [address] [place], [place] [address] [phone]  10:20 AM

## 2024-06-21 NOTE — Transfer of Care (Signed)
 Immediate Anesthesia Transfer of Care Note  Patient: Katie Jacobson  Procedure(s) Performed: TRANSESOPHAGEAL ECHOCARDIOGRAM  Patient Location: PACU  Anesthesia Type:MAC  Level of Consciousness: drowsy  Airway & Oxygen Therapy: Patient Spontanous Breathing and Patient connected to nasal cannula oxygen  Post-op Assessment: Report given to RN and Post -op Vital signs reviewed and stable  Post vital signs: Reviewed and stable  Last Vitals:  Vitals Value Taken Time  BP See PACU flowsheet, stable before leaving   Temp    Pulse 69 06/21/24 11:13  Resp 27 06/21/24 11:13  SpO2 95 % 06/21/24 11:13  Vitals shown include unfiled device data.  Last Pain:  Vitals:   06/21/24 1000  TempSrc: Temporal  PainSc: 0-No pain         Complications: There were no known notable events for this encounter.

## 2024-06-22 ENCOUNTER — Encounter (HOSPITAL_COMMUNITY): Payer: Self-pay | Admitting: Cardiovascular Disease

## 2024-06-24 ENCOUNTER — Encounter (HOSPITAL_BASED_OUTPATIENT_CLINIC_OR_DEPARTMENT_OTHER): Attending: Internal Medicine | Admitting: Internal Medicine

## 2024-06-24 DIAGNOSIS — X58XXXA Exposure to other specified factors, initial encounter: Secondary | ICD-10-CM | POA: Diagnosis not present

## 2024-06-24 DIAGNOSIS — S21001A Unspecified open wound of right breast, initial encounter: Secondary | ICD-10-CM | POA: Insufficient documentation

## 2024-06-24 DIAGNOSIS — L98492 Non-pressure chronic ulcer of skin of other sites with fat layer exposed: Secondary | ICD-10-CM | POA: Insufficient documentation

## 2024-06-24 DIAGNOSIS — L598 Other specified disorders of the skin and subcutaneous tissue related to radiation: Secondary | ICD-10-CM | POA: Insufficient documentation

## 2024-06-24 DIAGNOSIS — C50912 Malignant neoplasm of unspecified site of left female breast: Secondary | ICD-10-CM | POA: Insufficient documentation

## 2024-06-24 DIAGNOSIS — C50911 Malignant neoplasm of unspecified site of right female breast: Secondary | ICD-10-CM | POA: Insufficient documentation

## 2024-06-24 NOTE — Anesthesia Postprocedure Evaluation (Signed)
 Anesthesia Post Note  Patient: Katie Jacobson  Procedure(s) Performed: TRANSESOPHAGEAL ECHOCARDIOGRAM     Patient location during evaluation: Cath Lab Anesthesia Type: MAC Level of consciousness: awake and alert Pain management: pain level controlled Vital Signs Assessment: post-procedure vital signs reviewed and stable Respiratory status: spontaneous breathing, nonlabored ventilation, respiratory function stable and patient connected to nasal cannula oxygen Cardiovascular status: blood pressure returned to baseline and stable Postop Assessment: no apparent nausea or vomiting Anesthetic complications: no   There were no known notable events for this encounter.  Last Vitals:  Vitals:   06/21/24 1140 06/21/24 1145  BP: (!) 129/58 131/72  Pulse: 64 60  Resp: (!) 24 (!) 22  Temp:  36.7 C  SpO2: 99% 100%    Last Pain:  Vitals:   06/21/24 1145  TempSrc: Temporal  PainSc: 0-No pain                 Akeya Ryther L Aneta Hendershott

## 2024-06-28 ENCOUNTER — Telehealth: Payer: Self-pay | Admitting: Family

## 2024-06-28 DIAGNOSIS — E559 Vitamin D deficiency, unspecified: Secondary | ICD-10-CM

## 2024-06-29 NOTE — Telephone Encounter (Signed)
 Pt needs lab visit to check vit D prior to refill.

## 2024-06-30 NOTE — H&P (View-Only) (Signed)
 Cardiology Office Note:  .   Date:  07/01/2024  ID:  Katie Jacobson, DOB August 28, 1956, MRN [id number] PCP: Daryl Setter, NP  [facility] Providers Cardiologist:  Alm Clay, MD     Chief Complaint  Patient presents with   Follow-up    Post TEE follow-up   Cardiac Valve Problem    Severe MR/MVP as well as severe AI on TEE.  Interestingly, not really noticing any symptoms.    Patient Profile: .     Katie LEVITZ is a morbidly obese 68 y.o. female former smoker with a PMH notable for Right-Sided Breast Cancer (s/p) right breast mastectomy and left side lumpectomy), HTN, HLD, frequent palpitations and recent progressive of Katie mitral valvular disease to severe MR with MVP who presents here for close follow-up to discuss results of her TEE last week.   PMH: Valvular heart disease: Mild to moderate AI, moderate=> severe MR with MVP. HTN HLD Right-sided breast cancer with R-sided lumpectomy followed by chemo XRT in 2016 with recurrence in 2024. Right-sided mastectomy and left-sided lumpectomy  March 2024 and was having some issues with healing initially.  Finally had her port removed as of October 2024.    MIOSHA BEHE was last just on 06/07/2024 to discuss her transthoracic echocardiogram which showed progression of her mitral valve disease from mild MVP/MR to moderate-severe MVP-MR (previous Echo was in 2023).  She denied any significant cardiac symptoms other than fatigue.  No chest pain or dyspnea, and her palpitations are well-controlled.  Based on her lack of symptoms, we opted on a staged approach for evaluation to starting with a TEE.  If indicated we would then proceed with pre-MVR workup.  -> She had her TTE on 06/21/2024 and presents to discuss results.  Subjective  Discussed the use of AI scribe software for clinical note transcription with the patient, who gave verbal consent to proceed.  History of Present Illness [name] is a 68 year  old female with mitral valve disease who presents for evaluation of mitral regurgitation and aortic valve insufficiency. She is accompanied by her husband, Ron.  She has a history of breast cancer, having undergone a right lumpectomy in 2016 followed by radiation therapy. In 2024, she had a left lumpectomy and a right mastectomy, with additional radiation therapy. She experienced two areas that did not heal post-radiation, which eventually healed without hyperbaric oxygen therapy.  She has been experiencing persistent fatigue, which she initially attributed to her cancer treatment. She describes the fatigue as 'tired below the waist, like legs and everything.'  A transthoracic echocardiogram (TTE) and a transesophageal echocardiogram (TEE) revealed mitral regurgitation and aortic valve insufficiency.  No history of shortness of breath, either at rest or with exertion, and no orthopnea or paroxysmal nocturnal dyspnea. However, she notes a general sense of fatigue.  Her current medications include losartan 50 mg.   Cardiovascular ROS: no chest pain or dyspnea on exertion positive for - Generalized fatigue and exercise tolerance.  She says that her symptoms seem to be more below the waist than above. negative for - edema, irregular heartbeat, orthopnea, palpitations, paroxysmal nocturnal dyspnea, rapid heart rate, shortness of breath, or lightheadedness or dizziness, syncope or near syncope, TIA or amaurosis fugax, claudication.  ROS:  Review of Systems - Negative except symptoms noted above    Objective   Medications: Aspirin  81 mg daily; enalapril  20 mg daily; chlorthalidone  25 mg every other day Toprol -XL 100 mg daily Ventolin  1 to 2  puffs every 6 hours as needed, Flonase  1 spray to both nostrils as needed. Ergocalciferol  1.25 mg - 1 capsule   Studies Reviewed: SABRA   EKG Interpretation Date/Time:  Monday July 01 2024 11:36:12 EDT Ventricular Rate:  75 PR Interval:  144 QRS  Duration:  72 QT Interval:  378 QTC Calculation: 422 R Axis:   40  Text Interpretation: Sinus rhythm with marked sinus arrhythmia Possible Left atrial enlargement When compared with ECG of 07-Jun-2024 12:07, Premature ventricular complexes are no longer Present Confirmed by Anner Lenis ([id number]) on 07/01/2024 12:12:40 PM    Lab Results  Component Value Date   NA 142 06/07/2024   K 4.4 06/07/2024   CREATININE 0.95 06/07/2024   EGFR 66 06/07/2024   GLUCOSE 85 06/07/2024   Lab Results  Component Value Date   CHOL 199 09/22/2023   HDL 75 09/22/2023   LDLCALC 109 (H) 09/22/2023   TRIG 64 09/22/2023   CHOLHDL 2.7 09/22/2023      Latest Ref Rng & Units 06/07/2024    1:55 PM 07/12/2023    2:48 PM 03/22/2023   10:00 AM  CBC  WBC 3.4 - 10.8 x10E3/uL 6.0  4.5  6.8   Hemoglobin 11.1 - 15.9 g/dL 85.3  86.3  88.9   Hematocrit 34.0 - 46.6 % 43.8  41.5  32.9   Platelets 150 - 450 x10E3/uL 219  259  257    No results found for: HGBA1C   TEE (06/21/2024): Katie mitral valve with severely calcified leaflets.  Severe mitral regurgitation (ERO 0.99 cm, regurgitant volume 99 mL) mild to moderate MS with mean MVG of 3 mm.  At 76 bpm.  MVA by pressure half-time 1.80 cm, by 3D assessment 1.60 cm.  Systolic blunting of all 4 pulmonary veins with a central jet and severe LA dilation making reversal of flow unlikely.  Tricuspid aortic valve with restricted leaflet closure in diastole.  Thickened leaflets.  Holosystolic flow reversal in descending aorta consistent with Severe Aortic Regurgitation.  EF estimated 55 to 60%.  Mildly dilated LV.  Normal RV.  Severely dilated LA with no LAA thrombus.  Mildly dilated RA.  Negative bubble study.  ECHO (05/31/2024): Katie mitral valve with moderate to severe MR now present.  No MS.  Severe LA dilation.  Consider TEE to quantify MR.  Mean mitral gradient is 2.5 mmHg (heart rate 63 bpm.); normal LV size and function EF 55 to 60%.  No RWMA.  Unable to assess  diastolic parameters.  Mildly dilated RV with normal PAP.  Normal RAP Previous Echo 05/2022: Katie mitral valve with mild MR.  EF 60 to 65%.  No RWMA.  Elevated LVEDP.  AV sclerosis with no stenosis but mild to moderate AI.  Normal RAP.  Risk Assessment/Calculations:              Physical Exam:   VS:  BP 125/77 (BP Location: Left Arm, Patient Position: Sitting)   Pulse 75   Ht 5' 1 (1.549 m)   Wt 217 lb (98.4 kg)   LMP 10/10/1996   SpO2 99%   BMI 41.00 kg/m    Wt Readings from Last 3 Encounters:  07/01/24 217 lb (98.4 kg)  06/21/24 217 lb (98.4 kg)  06/07/24 217 lb 11.2 oz (98.7 kg)      GEN: Morbidly obese, but otherwise healthy appearing.  Well nourished, well groomed in no acute distress; notably upset NECK: No JVD; No carotid bruits CARDIAC: RRR with ectopy; Normal S1, S2;  no obvious midsystolic click.  Soft harsh 1-6 SEM at RUSB and 1/6 HSM at apex.  No rubs or gallops. RESPIRATORY:  Clear to auscultation without rales, wheezing or rhonchi ; nonlabored, good air movement. ABDOMEN: Soft, non-tender, non-distended EXTREMITIES:  No edema; No deformity      ASSESSMENT AND PLAN: .    Problem List Items Addressed This Visit       Cardiology Problems   Essential hypertension (Chronic)   BP well-controlled on Toprol  100 mg daily along with enalapril  20 mg daily. -Continue current meds      Hyperlipidemia LDL goal <100 (Chronic)   Depending on findings on cardiac cath, can determine how aggressive we need to be.  Not currently on medications.  She was not interested in discussing the medications today.      Katie mitral and aortic valve regurgitation - Primary (Chronic)   Interesting Jama, there was suggestion of mitral prolapse and previous echoes but the recent TTE and TEE showed most regurgitation with a TEE suggesting severe MR with mild to moderate MS but also noted severe AI. Severe mitral regurgitation likely due to Katie heart disease, possibly  exacerbated by prior radiation therapy. Significant mitral valve pathology with dilated left atrium. Progressed from mild to severe, necessitating surgical intervention. Repair preferred over replacement for durability and outcomes. Early intervention advised to prevent cardiac deterioration. - Schedule Right and Left HeartCatheterization on October 7th to assess heart function and coronary artery status. - Refer to T CTS for evaluation and potential mitral valve repair (versus mitral and aortic valve replacement. - Discuss potential surgical options with the cardiac surgeon, including mitral valve repair with a ring versus mitral and aortic valve replacement) - Monitor for symptoms of heart failure or atrial fibrillation. - Educated on importance of antibiotic prophylaxis for dental and GI procedures post-surgery.  Discussed risk benefits alternatives and indications of cardiac catheterization but also gave preliminary description of surgical options.  See Shared Decision Making/Informed Consent statement below.        Other   Obesity (BMI 35.0-39.9 without comorbidity) (Chronic)   Unfortunately, her weight may make it difficult to recover from surgeries.  She had trouble already recovering from her breast cancer surgeries.  She is concerned with the combination of obesity and radiation exposure affecting her ability to have thoracic surgery.  She will discuss this with cardiac surgeon.      Smoking greater than 40 pack years (Chronic)   Did not go into detail about smoking cessation as she was somewhat stressed and emotional about upcoming procedures      Other Visit Diagnoses       Katie aortic valve insufficiency       Relevant Orders   EKG 12-Lead (Completed)   Ambulatory referral to Cardiothoracic Surgery   Basic metabolic panel with GFR   CBC     Pre-operative cardiovascular examination       Relevant Orders   Ambulatory referral to Cardiothoracic Surgery   Basic  metabolic panel with GFR   CBC            Informed Consent   Shared Decision Making/Informed Consent The risks, including but not limited to, [bleeding or vascular complications (1 in 500), pneumothorax (1 in 1600), arrhythmia (1 in 1000) and death (1 in 5000)], benefits (diagnostic support and/or management of heart failure, pulmonary hypertension) and alternatives of a right heart catheterization were discussed in detail with Ms. Alber and she is willing to proceed. The risks [stroke (  1 in 1000), death (1 in 1000), kidney failure [usually temporary] (1 in 500), bleeding (1 in 200), allergic reaction [possibly serious] (1 in 200)], benefits (diagnostic support and management of coronary artery disease) and alternatives of a cardiac catheterization were discussed in detail with Ms. Spieth and she is willing to proceed.      Follow-Up: Return in about 4 months (around 10/31/2024) for 3-4 month follow-up, Routine follow up with me.  I spent 74 minutes in the care of DAYANNA PRYCE today including reviewing labs (1 minute), reviewing studies (9 minutes reviewing TEE results versus TTE results), face to face time discussing treatment options (52 minutes), reviewing records from last note (3 minutes), 9 minutes dictating, and documenting in the encounter.     Signed, Alm MICAEL Clay, MD, MS Alm Clay, M.D., M.S. Interventional Cardiologist  [facility] Pager # [phone]

## 2024-06-30 NOTE — Progress Notes (Unsigned)
 Cardiology Office Note:  .   Date:  07/01/2024  ID:  Katie Jacobson, DOB 05/03/1956, MRN [id number] PCP: Daryl Setter, NP  [facility] Providers Cardiologist:  Alm Clay, MD     Chief Complaint  Patient presents with   Follow-up    Post TEE follow-up   Cardiac Valve Problem    Severe MR/MVP as well as severe AI on TEE.  Interestingly, not really noticing any symptoms.    Patient Profile: .     Katie Jacobson is a morbidly obese 68 y.o. female former smoker with a PMH notable for Right-Sided Breast Cancer (s/p) right breast mastectomy and left side lumpectomy), HTN, HLD, frequent palpitations and recent progressive of rheumatic mitral valvular disease to severe MR with MVP who presents here for close follow-up to discuss results of her TEE last week.   PMH: Valvular heart disease: Mild to moderate AI, moderate=> severe MR with MVP. HTN HLD Right-sided breast cancer with R-sided lumpectomy followed by chemo XRT in 2016 with recurrence in 2024. Right-sided mastectomy and left-sided lumpectomy  March 2024 and was having some issues with healing initially.  Finally had her port removed as of October 2024.    Katie Jacobson was last just on 06/07/2024 to discuss her transthoracic echocardiogram which showed progression of her mitral valve disease from mild MVP/MR to moderate-severe MVP-MR (previous Echo was in 2023).  She denied any significant cardiac symptoms other than fatigue.  No chest pain or dyspnea, and her palpitations are well-controlled.  Based on her lack of symptoms, we opted on a staged approach for evaluation to starting with a TEE.  If indicated we would then proceed with pre-MVR workup.  -> She had her TTE on 06/21/2024 and presents to discuss results.  Subjective  Discussed the use of AI scribe software for clinical note transcription with the patient, who gave verbal consent to proceed.  History of Present Illness Katie Jacobson is a 68 year  old female with mitral valve disease who presents for evaluation of mitral regurgitation and aortic valve insufficiency. She is accompanied by her husband, Katie Jacobson.  She has a history of breast cancer, having undergone a right lumpectomy in 2016 followed by radiation therapy. In 2024, she had a left lumpectomy and a right mastectomy, with additional radiation therapy. She experienced two areas that did not heal post-radiation, which eventually healed without hyperbaric oxygen therapy.  She has been experiencing persistent fatigue, which she initially attributed to her cancer treatment. She describes the fatigue as 'tired below the waist, like legs and everything.'  A transthoracic echocardiogram (TTE) and a transesophageal echocardiogram (TEE) revealed mitral regurgitation and aortic valve insufficiency.  No history of shortness of breath, either at rest or with exertion, and no orthopnea or paroxysmal nocturnal dyspnea. However, she notes a general sense of fatigue.  Her current medications include losartan 50 mg.   Cardiovascular ROS: no chest pain or dyspnea on exertion positive for - Generalized fatigue and exercise tolerance.  She says that her symptoms seem to be more below the waist than above. negative for - edema, irregular heartbeat, orthopnea, palpitations, paroxysmal nocturnal dyspnea, rapid heart rate, shortness of breath, or lightheadedness or dizziness, syncope or near syncope, TIA or amaurosis fugax, claudication.  ROS:  Review of Systems - Negative except symptoms noted above    Objective   Medications: Aspirin  81 mg daily; enalapril  20 mg daily; chlorthalidone  25 mg every other day Toprol -XL 100 mg daily Ventolin  1 to 2  puffs every 6 hours as needed, Flonase  1 spray to both nostrils as needed. Ergocalciferol  1.25 mg - 1 capsule   Studies Reviewed: SABRA   EKG Interpretation Date/Time:  Monday July 01 2024 11:36:12 EDT Ventricular Rate:  75 PR Interval:  144 QRS  Duration:  72 QT Interval:  378 QTC Calculation: 422 R Axis:   40  Text Interpretation: Sinus rhythm with marked sinus arrhythmia Possible Left atrial enlargement When compared with ECG of 07-Jun-2024 12:07, Premature ventricular complexes are no longer Present Confirmed by Anner Lenis ([id number]) on 07/01/2024 12:12:40 PM    Lab Results  Component Value Date   NA 142 06/07/2024   K 4.4 06/07/2024   CREATININE 0.95 06/07/2024   EGFR 66 06/07/2024   GLUCOSE 85 06/07/2024   Lab Results  Component Value Date   CHOL 199 09/22/2023   HDL 75 09/22/2023   LDLCALC 109 (H) 09/22/2023   TRIG 64 09/22/2023   CHOLHDL 2.7 09/22/2023      Latest Ref Rng & Units 06/07/2024    1:55 PM 07/12/2023    2:48 PM 03/22/2023   10:00 AM  CBC  WBC 3.4 - 10.8 x10E3/uL 6.0  4.5  6.8   Hemoglobin 11.1 - 15.9 g/dL 85.3  86.3  88.9   Hematocrit 34.0 - 46.6 % 43.8  41.5  32.9   Platelets 150 - 450 x10E3/uL 219  259  257    No results found for: HGBA1C   TEE (06/21/2024): Rheumatic mitral valve with severely calcified leaflets.  Severe mitral regurgitation (ERO 0.99 cm, regurgitant volume 99 mL) mild to moderate MS with mean MVG of 3 mm.  At 76 bpm.  MVA by pressure half-time 1.80 cm, by 3D assessment 1.60 cm.  Systolic blunting of all 4 pulmonary veins with a central jet and severe LA dilation making reversal of flow unlikely.  Tricuspid aortic valve with restricted leaflet closure in diastole.  Thickened leaflets.  Holosystolic flow reversal in descending aorta consistent with Severe Aortic Regurgitation.  EF estimated 55 to 60%.  Mildly dilated LV.  Normal RV.  Severely dilated LA with no LAA thrombus.  Mildly dilated RA.  Negative bubble study.  ECHO (05/31/2024): Rheumatic mitral valve with moderate to severe MR now present.  No MS.  Severe LA dilation.  Consider TEE to quantify MR.  Mean mitral gradient is 2.5 mmHg (heart rate 63 bpm.); normal LV size and function EF 55 to 60%.  No RWMA.  Unable to assess  diastolic parameters.  Mildly dilated RV with normal PAP.  Normal RAP Previous Echo 05/2022: Rheumatic mitral valve with mild MR.  EF 60 to 65%.  No RWMA.  Elevated LVEDP.  AV sclerosis with no stenosis but mild to moderate AI.  Normal RAP.  Risk Assessment/Calculations:              Physical Exam:   VS:  BP 125/77 (BP Location: Left Arm, Patient Position: Sitting)   Pulse 75   Ht 5' 1 (1.549 m)   Wt 217 lb (98.4 kg)   LMP 10/10/1996   SpO2 99%   BMI 41.00 kg/m    Wt Readings from Last 3 Encounters:  07/01/24 217 lb (98.4 kg)  06/21/24 217 lb (98.4 kg)  06/07/24 217 lb 11.2 oz (98.7 kg)      GEN: Morbidly obese, but otherwise healthy appearing.  Well nourished, well groomed in no acute distress; notably upset NECK: No JVD; No carotid bruits CARDIAC: RRR with ectopy; Normal S1, S2;  no obvious midsystolic click.  Soft harsh 1-6 SEM at RUSB and 1/6 HSM at apex.  No rubs or gallops. RESPIRATORY:  Clear to auscultation without rales, wheezing or rhonchi ; nonlabored, good air movement. ABDOMEN: Soft, non-tender, non-distended EXTREMITIES:  No edema; No deformity      ASSESSMENT AND PLAN: .    Problem List Items Addressed This Visit       Cardiology Problems   Essential hypertension (Chronic)   BP well-controlled on Toprol  100 mg daily along with enalapril  20 mg daily. -Continue current meds      Hyperlipidemia LDL goal <100 (Chronic)   Depending on findings on cardiac cath, can determine how aggressive we need to be.  Not currently on medications.  She was not interested in discussing the medications today.      Rheumatic mitral and aortic valve regurgitation - Primary (Chronic)   Interesting Jama, there was suggestion of mitral prolapse and previous echoes but the recent TTE and TEE showed most regurgitation with a TEE suggesting severe MR with mild to moderate MS but also noted severe AI. Severe mitral regurgitation likely due to rheumatic heart disease, possibly  exacerbated by prior radiation therapy. Significant mitral valve pathology with dilated left atrium. Progressed from mild to severe, necessitating surgical intervention. Repair preferred over replacement for durability and outcomes. Early intervention advised to prevent cardiac deterioration. - Schedule Right and Left HeartCatheterization on October 7th to assess heart function and coronary artery status. - Refer to T CTS for evaluation and potential mitral valve repair (versus mitral and aortic valve replacement. - Discuss potential surgical options with the cardiac surgeon, including mitral valve repair with a ring versus mitral and aortic valve replacement) - Monitor for symptoms of heart failure or atrial fibrillation. - Educated on importance of antibiotic prophylaxis for dental and GI procedures post-surgery.  Discussed risk benefits alternatives and indications of cardiac catheterization but also gave preliminary description of surgical options.  See Shared Decision Making/Informed Consent statement below.        Other   Obesity (BMI 35.0-39.9 without comorbidity) (Chronic)   Unfortunately, her weight may make it difficult to recover from surgeries.  She had trouble already recovering from her breast cancer surgeries.  She is concerned with the combination of obesity and radiation exposure affecting her ability to have thoracic surgery.  She will discuss this with cardiac surgeon.      Smoking greater than 40 pack years (Chronic)   Did not go into detail about smoking cessation as she was somewhat stressed and emotional about upcoming procedures      Other Visit Diagnoses       Rheumatic aortic valve insufficiency       Relevant Orders   EKG 12-Lead (Completed)   Ambulatory referral to Cardiothoracic Surgery   Basic metabolic panel with GFR   CBC     Pre-operative cardiovascular examination       Relevant Orders   Ambulatory referral to Cardiothoracic Surgery   Basic  metabolic panel with GFR   CBC            Informed Consent   Shared Decision Making/Informed Consent The risks, including but not limited to, [bleeding or vascular complications (1 in 500), pneumothorax (1 in 1600), arrhythmia (1 in 1000) and death (1 in 5000)], benefits (diagnostic support and/or management of heart failure, pulmonary hypertension) and alternatives of a right heart catheterization were discussed in detail with Ms. Cratty and she is willing to proceed. The risks [stroke (  1 in 1000), death (1 in 1000), kidney failure [usually temporary] (1 in 500), bleeding (1 in 200), allergic reaction [possibly serious] (1 in 200)], benefits (diagnostic support and management of coronary artery disease) and alternatives of a cardiac catheterization were discussed in detail with Ms. Hardge and she is willing to proceed.      Follow-Up: Return in about 4 months (around 10/31/2024) for 3-4 month follow-up, Routine follow up with me.  I spent 74 minutes in the care of DARSI TIEN today including reviewing labs (1 minute), reviewing studies (9 minutes reviewing TEE results versus TTE results), face to face time discussing treatment options (52 minutes), reviewing records from last note (3 minutes), 9 minutes dictating, and documenting in the encounter.     Signed, Alm MICAEL Clay, MD, MS Alm Clay, M.D., M.S. Interventional Cardiologist  [facility] Pager # [phone]

## 2024-07-01 ENCOUNTER — Ambulatory Visit: Attending: Cardiology | Admitting: Cardiology

## 2024-07-01 ENCOUNTER — Encounter: Payer: Self-pay | Admitting: Cardiology

## 2024-07-01 VITALS — BP 125/77 | HR 75 | Ht 61.0 in | Wt 217.0 lb

## 2024-07-01 DIAGNOSIS — E669 Obesity, unspecified: Secondary | ICD-10-CM | POA: Diagnosis not present

## 2024-07-01 DIAGNOSIS — F17210 Nicotine dependence, cigarettes, uncomplicated: Secondary | ICD-10-CM

## 2024-07-01 DIAGNOSIS — I10 Essential (primary) hypertension: Secondary | ICD-10-CM

## 2024-07-01 DIAGNOSIS — Z01810 Encounter for preprocedural cardiovascular examination: Secondary | ICD-10-CM | POA: Diagnosis not present

## 2024-07-01 DIAGNOSIS — E785 Hyperlipidemia, unspecified: Secondary | ICD-10-CM

## 2024-07-01 DIAGNOSIS — I341 Nonrheumatic mitral (valve) prolapse: Secondary | ICD-10-CM | POA: Diagnosis not present

## 2024-07-01 DIAGNOSIS — I080 Rheumatic disorders of both mitral and aortic valves: Secondary | ICD-10-CM

## 2024-07-01 DIAGNOSIS — I061 Rheumatic aortic insufficiency: Secondary | ICD-10-CM

## 2024-07-01 NOTE — Assessment & Plan Note (Signed)
 Did not go into detail about smoking cessation as she was somewhat stressed and emotional about upcoming procedures

## 2024-07-01 NOTE — Assessment & Plan Note (Signed)
 BP well-controlled on Toprol  100 mg daily along with enalapril  20 mg daily. -Continue current meds

## 2024-07-01 NOTE — Patient Instructions (Signed)
 Medication Instructions:  NO   CHANGES   *If you need a refill on your cardiac medications before your next appointment, please call your pharmacy*   Lab Work: CBC BMP If you have labs (blood work) drawn today and your tests are completely normal, you will receive your results only by: MyChart Message (if you have MyChart) OR A paper copy in the mail If you have any lab test that is abnormal or we need to change your treatment, we will call you to review the results.   Testing/Procedures:  Your physician has requested that you have a  Right and Left cardiac catheterization. Cardiac catheterization is used to diagnose and/or treat various heart conditions. Doctors may recommend this procedure for a number of different reasons. The most common reason is to evaluate chest pain. Chest pain can be a symptom of coronary artery disease (CAD), and cardiac catheterization can show whether plaque is narrowing or blocking your heart's arteries. This procedure is also used to evaluate the valves, as well as measure the blood flow and oxygen levels in different parts of your heart.  Please follow instruction sheet, as given.   Follow-Up: At [facility], you and your health needs are our priority.  As part of our continuing mission to provide you with exceptional heart care, we have created designated Provider Care Teams.  These Care Teams include your primary Cardiologist (physician) and Advanced Practice Providers (APPs -  Physician Assistants and Nurse Practitioners) who all work together to provide you with the care you need, when you need it.     Your next appointment:   4 month(s)  The format for your next appointment:   In Person  Provider:   Alm Clay, MD   Other Instructions    [facility] A DEPT OF [facility] [address] A DEPT OF THE [facility] [address] [place] [address] Dept: [phone] Loc:  [phone]  Katie Jacobson  07/01/2024  You are scheduled for a Cardiac Catheterization on Tuesday, October 7 with Dr. Alm Clay.  1. Please arrive at the [facility] (Main Entrance A) at [facility]: [address] [place], [place] [address] at 7:30 AM (This time is 2 hour(s) before your procedure to ensure your preparation).   Free valet parking service is available. You will check in at ADMITTING. The support person will be asked to wait in the waiting room.  It is OK to have someone drop you off and come back when you are ready to be discharged.    Special note: Every effort is made to have your procedure done on time. Please understand that emergencies sometimes delay scheduled procedures.  2. Diet: Nothing to eat after midnight.   3. Hydration: You need to be well hydrated before your procedure. On October 7, you may drink approved liquids (see below) until 2 hours before the procedure, with 16 oz of water as your last intake.   List of approved liquids water, clear juice, clear tea, black coffee, fruit juices, non-citric and without pulp, carbonated beverages, Gatorade, Kool -Aid, plain Jello-O and plain ice popsicles.   4. Labs: You will need to have blood drawn  TODAY FOR  CBC,BMP,   at [facility] (1st Floor), [address], [place], [place] [address]. You do not need to be fasting.  5. Medication instructions in preparation for your procedure:  Contrast Allergy: No    Stop taking, CHLORTHALIDONE  Tuesday, October 7,    On the morning of your procedure, take your Aspirin  81 mg and any morning medicines NOT listed above.  You may use sips of water.  6. Plan to go home the same day, you will only stay overnight if medically necessary. 7. Bring a current list of your medications and current insurance cards. 8. You MUST have a responsible person to drive you home. 9. Someone MUST be with you the first 24 hours  after you arrive home or your discharge will be delayed. 10. Please wear clothes that are easy to get on and off and wear slip-on shoes.  Thank you for allowing us  to care for you!   -- [facility]

## 2024-07-01 NOTE — Assessment & Plan Note (Signed)
 Depending on findings on cardiac cath, can determine how aggressive we need to be.  Not currently on medications.  She was not interested in discussing the medications today.

## 2024-07-01 NOTE — Assessment & Plan Note (Signed)
 Unfortunately, her weight may make it difficult to recover from surgeries.  She had trouble already recovering from her breast cancer surgeries.  She is concerned with the combination of obesity and radiation exposure affecting her ability to have thoracic surgery.  She will discuss this with cardiac surgeon.

## 2024-07-01 NOTE — Assessment & Plan Note (Signed)
 Interesting Jama, there was suggestion of mitral prolapse and previous echoes but the recent TTE and TEE showed most regurgitation with a TEE suggesting severe MR with mild to moderate MS but also noted severe AI. Severe mitral regurgitation likely due to rheumatic heart disease, possibly exacerbated by prior radiation therapy. Significant mitral valve pathology with dilated left atrium. Progressed from mild to severe, necessitating surgical intervention. Repair preferred over replacement for durability and outcomes. Early intervention advised to prevent cardiac deterioration. - Schedule Right and Left HeartCatheterization on October 7th to assess heart function and coronary artery status. - Refer to T CTS for evaluation and potential mitral valve repair (versus mitral and aortic valve replacement. - Discuss potential surgical options with the cardiac surgeon, including mitral valve repair with a ring versus mitral and aortic valve replacement) - Monitor for symptoms of heart failure or atrial fibrillation. - Educated on importance of antibiotic prophylaxis for dental and GI procedures post-surgery.  Discussed risk benefits alternatives and indications of cardiac catheterization but also gave preliminary description of surgical options.  See Shared Decision Making/Informed Consent statement below.

## 2024-07-02 LAB — CBC
Hematocrit: 43.8 % (ref 34.0–46.6)
Hemoglobin: 14.9 g/dL (ref 11.1–15.9)
MCH: 32.7 pg (ref 26.6–33.0)
MCHC: 34 g/dL (ref 31.5–35.7)
MCV: 96 fL (ref 79–97)
Platelets: 221 x10E3/uL (ref 150–450)
RBC: 4.55 x10E6/uL (ref 3.77–5.28)
RDW: 14.4 % (ref 11.7–15.4)
WBC: 6.3 x10E3/uL (ref 3.4–10.8)

## 2024-07-02 LAB — BASIC METABOLIC PANEL WITH GFR
BUN/Creatinine Ratio: 20 (ref 12–28)
BUN: 17 mg/dL (ref 8–27)
CO2: 23 mmol/L (ref 20–29)
Calcium: 9.6 mg/dL (ref 8.7–10.3)
Chloride: 101 mmol/L (ref 96–106)
Creatinine, Ser: 0.84 mg/dL (ref 0.57–1.00)
Glucose: 78 mg/dL (ref 70–99)
Potassium: 4.1 mmol/L (ref 3.5–5.2)
Sodium: 142 mmol/L (ref 134–144)
eGFR: 77 mL/min/1.73 (ref >59)

## 2024-07-03 ENCOUNTER — Ambulatory Visit: Payer: Self-pay | Admitting: Cardiology

## 2024-07-03 NOTE — Telephone Encounter (Signed)
 Copied from CRM [id number]. Topic: General - Other >> Jul 03, 2024 11:34 AM Henretta I wrote: Reason for CRM: Patient stated she missed a call from office today, advised no documentation found for that call but patient is requesting a call back from the nurse to discuss other health issues going on.

## 2024-07-03 NOTE — Telephone Encounter (Signed)
 Patient notified of this information, she is getting ready to have heart surgery and will wait until later to check vitamin D  levels. Orders entered as future so she can call us  at any time to set up lab visit.

## 2024-07-03 NOTE — Telephone Encounter (Signed)
 See below- did you try calling Pt?

## 2024-07-11 DIAGNOSIS — L598 Other specified disorders of the skin and subcutaneous tissue related to radiation: Secondary | ICD-10-CM | POA: Diagnosis not present

## 2024-07-12 DIAGNOSIS — L598 Other specified disorders of the skin and subcutaneous tissue related to radiation: Secondary | ICD-10-CM | POA: Diagnosis not present

## 2024-07-15 ENCOUNTER — Telehealth: Payer: Self-pay | Admitting: *Deleted

## 2024-07-15 NOTE — Telephone Encounter (Signed)
 Cardiac Catheterization scheduled at [facility] for: Tuesday July 16, 2024 9:30 AM Arrival time [facility] Main Entrance A at: 7:30 AM  Diet: -Nothing to eat after midnight.  Hydration: -May drink clear liquids until 2 hours before the procedure.  Approved liquids: Water, clear tea, black coffee, fruit juices-non-citric and without pulp,Gatorade, plain Jello/popsicles.   -Please drink 16 oz of water 2 hours before procedure.  Medication instructions: -Hold:  Chlorthalidone -AM of procedure  -Other usual morning medications can be taken including aspirin  81 mg.  Plan to go home the same day, you will only stay overnight if medically necessary.  You must have responsible adult to drive you home.  Someone must be with you the first 24 hours after you arrive home.  Reviewed procedure instructions with patient.

## 2024-07-16 ENCOUNTER — Encounter (HOSPITAL_COMMUNITY)
Admission: RE | Disposition: A | Discharge status: Home / Self Care | Payer: Self-pay | Source: Home / Self Care | Attending: Cardiology

## 2024-07-16 ENCOUNTER — Other Ambulatory Visit: Payer: Self-pay

## 2024-07-16 ENCOUNTER — Encounter (HOSPITAL_COMMUNITY): Payer: Self-pay | Admitting: Cardiology

## 2024-07-16 ENCOUNTER — Ambulatory Visit (HOSPITAL_COMMUNITY)
Admission: RE | Admit: 2024-07-16 | Discharge: 2024-07-16 | Disposition: A | Discharge status: Home / Self Care | Attending: Cardiology | Admitting: Cardiology

## 2024-07-16 DIAGNOSIS — I340 Nonrheumatic mitral (valve) insufficiency: Secondary | ICD-10-CM | POA: Diagnosis not present

## 2024-07-16 DIAGNOSIS — F17210 Nicotine dependence, cigarettes, uncomplicated: Secondary | ICD-10-CM | POA: Diagnosis not present

## 2024-07-16 DIAGNOSIS — I110 Hypertensive heart disease with heart failure: Secondary | ICD-10-CM | POA: Insufficient documentation

## 2024-07-16 DIAGNOSIS — I272 Pulmonary hypertension, unspecified: Secondary | ICD-10-CM | POA: Diagnosis not present

## 2024-07-16 DIAGNOSIS — Z6841 Body Mass Index (BMI) 40.0 and over, adult: Secondary | ICD-10-CM | POA: Diagnosis not present

## 2024-07-16 DIAGNOSIS — I509 Heart failure, unspecified: Secondary | ICD-10-CM | POA: Insufficient documentation

## 2024-07-16 DIAGNOSIS — E785 Hyperlipidemia, unspecified: Secondary | ICD-10-CM | POA: Insufficient documentation

## 2024-07-16 DIAGNOSIS — I2510 Atherosclerotic heart disease of native coronary artery without angina pectoris: Secondary | ICD-10-CM | POA: Insufficient documentation

## 2024-07-16 DIAGNOSIS — Z79899 Other long term (current) drug therapy: Secondary | ICD-10-CM | POA: Insufficient documentation

## 2024-07-16 HISTORY — PX: RIGHT/LEFT HEART CATH AND CORONARY ANGIOGRAPHY: CATH118266

## 2024-07-16 LAB — POCT I-STAT EG7
Acid-Base Excess: 0 mmol/L (ref 0.0–2.0)
Acid-base deficit: 7 mmol/L — ABNORMAL HIGH (ref 0.0–2.0)
Bicarbonate: 19.9 mmol/L — ABNORMAL LOW (ref 20.0–28.0)
Bicarbonate: 25 mmol/L (ref 20.0–28.0)
Calcium, Ion: 0.99 mmol/L — ABNORMAL LOW (ref 1.15–1.40)
Calcium, Ion: 1.17 mmol/L (ref 1.15–1.40)
HCT: 38 % (ref 36.0–46.0)
HCT: 38 % (ref 36.0–46.0)
Hemoglobin: 12.9 g/dL (ref 12.0–15.0)
Hemoglobin: 12.9 g/dL (ref 12.0–15.0)
O2 Saturation: 62 %
O2 Saturation: 63 %
Potassium: 2.8 mmol/L — ABNORMAL LOW (ref 3.5–5.1)
Potassium: 3.3 mmol/L — ABNORMAL LOW (ref 3.5–5.1)
Sodium: 125 mmol/L — ABNORMAL LOW (ref 135–145)
Sodium: 138 mmol/L (ref 135–145)
TCO2: 21 mmol/L — ABNORMAL LOW (ref 22–32)
TCO2: 26 mmol/L (ref 22–32)
pCO2, Ven: 43 mmHg — ABNORMAL LOW (ref 44–60)
pCO2, Ven: 44.2 mmHg (ref 44–60)
pH, Ven: 7.262 (ref 7.25–7.43)
pH, Ven: 7.373 (ref 7.25–7.43)
pO2, Ven: 34 mmHg (ref 32–45)
pO2, Ven: 37 mmHg (ref 32–45)

## 2024-07-16 LAB — POCT I-STAT 7, (LYTES, BLD GAS, ICA,H+H)
Acid-Base Excess: 0 mmol/L (ref 0.0–2.0)
Bicarbonate: 24.1 mmol/L (ref 20.0–28.0)
Calcium, Ion: 1.2 mmol/L (ref 1.15–1.40)
HCT: 38 % (ref 36.0–46.0)
Hemoglobin: 12.9 g/dL (ref 12.0–15.0)
O2 Saturation: 92 %
Potassium: 3.4 mmol/L — ABNORMAL LOW (ref 3.5–5.1)
Sodium: 138 mmol/L (ref 135–145)
TCO2: 25 mmol/L (ref 22–32)
pCO2 arterial: 38.1 mmHg (ref 32–48)
pH, Arterial: 7.409 (ref 7.35–7.45)
pO2, Arterial: 63 mmHg — ABNORMAL LOW (ref 83–108)

## 2024-07-16 SURGERY — RIGHT/LEFT HEART CATH AND CORONARY ANGIOGRAPHY
Anesthesia: LOCAL

## 2024-07-16 MED ORDER — HEPARIN (PORCINE) IN NACL 1000-0.9 UT/500ML-% IV SOLN
INTRAVENOUS | Status: DC | PRN
  Administered 2024-07-16: 1000 mL via SURGICAL_CAVITY

## 2024-07-16 MED ORDER — SODIUM CHLORIDE 0.9% FLUSH: 3.0000 mL | Freq: Two times a day (BID) | INTRAVENOUS | Status: DC

## 2024-07-16 MED ORDER — MIDAZOLAM HCL 2 MG/2ML IJ SOLN
INTRAMUSCULAR | Status: DC | PRN
  Administered 2024-07-16: 1 mg via INTRAVENOUS

## 2024-07-16 MED ORDER — VERAPAMIL HCL 2.5 MG/ML IV SOLN
INTRAVENOUS | Status: DC | PRN
  Administered 2024-07-16: 10 mL via INTRA_ARTERIAL

## 2024-07-16 MED ORDER — LABETALOL HCL 5 MG/ML IV SOLN: 10.0000 mg | INTRAVENOUS | Status: DC | PRN

## 2024-07-16 MED ORDER — HYDRALAZINE HCL 20 MG/ML IJ SOLN: 10.0000 mg | INTRAMUSCULAR | Status: DC | PRN

## 2024-07-16 MED ORDER — ASPIRIN 81 MG PO CHEW
81.0000 mg | CHEWABLE_TABLET | ORAL | Status: AC
  Administered 2024-07-16: 81 mg via ORAL
  Filled 2024-07-16: qty 1

## 2024-07-16 MED ORDER — LIDOCAINE HCL (PF) 1 % IJ SOLN
INTRAMUSCULAR | Status: AC
  Filled 2024-07-16: qty 30

## 2024-07-16 MED ORDER — MIDAZOLAM HCL 2 MG/2ML IJ SOLN
INTRAMUSCULAR | Status: AC
  Filled 2024-07-16: qty 2

## 2024-07-16 MED ORDER — ACETAMINOPHEN 325 MG PO TABS: 650.0000 mg | ORAL_TABLET | ORAL | Status: DC | PRN

## 2024-07-16 MED ORDER — HEPARIN SODIUM (PORCINE) 1000 UNIT/ML IJ SOLN
INTRAMUSCULAR | Status: DC | PRN
  Administered 2024-07-16: 5000 [IU] via INTRAVENOUS

## 2024-07-16 MED ORDER — ONDANSETRON HCL 4 MG/2ML IJ SOLN: 4.0000 mg | Freq: Four times a day (QID) | INTRAMUSCULAR | Status: DC | PRN

## 2024-07-16 MED ORDER — FREE WATER
500.0000 mL | Freq: Once | Status: DC
Start: 2024-07-16 — End: 2024-07-17

## 2024-07-16 MED ORDER — FREE WATER: 500.0000 mL | Freq: Once | Status: DC

## 2024-07-16 MED ORDER — HEPARIN SODIUM (PORCINE) 1000 UNIT/ML IJ SOLN
INTRAMUSCULAR | Status: AC
  Filled 2024-07-16: qty 10

## 2024-07-16 MED ORDER — IOHEXOL 350 MG/ML SOLN
INTRAVENOUS | Status: DC | PRN
  Administered 2024-07-16: 50 mL via INTRA_ARTERIAL

## 2024-07-16 MED ORDER — SODIUM CHLORIDE 0.9 % IV SOLN: 250.0000 mL | INTRAVENOUS | Status: DC | PRN

## 2024-07-16 MED ORDER — FENTANYL CITRATE (PF) 100 MCG/2ML IJ SOLN
INTRAMUSCULAR | Status: DC | PRN
  Administered 2024-07-16: 25 ug via INTRAVENOUS

## 2024-07-16 MED ORDER — SODIUM CHLORIDE 0.9% FLUSH: 3.0000 mL | INTRAVENOUS | Status: DC | PRN

## 2024-07-16 MED ORDER — FENTANYL CITRATE (PF) 100 MCG/2ML IJ SOLN
INTRAMUSCULAR | Status: AC
  Filled 2024-07-16: qty 2

## 2024-07-16 MED ORDER — LIDOCAINE HCL (PF) 1 % IJ SOLN
INTRAMUSCULAR | Status: DC | PRN
  Administered 2024-07-16 (×2): 2 mL

## 2024-07-16 MED ORDER — VERAPAMIL HCL 2.5 MG/ML IV SOLN
INTRAVENOUS | Status: AC
  Filled 2024-07-16: qty 2

## 2024-07-16 SURGICAL SUPPLY — 11 items
CATH 5FR JL3.5 JR4 ANG PIG MP (CATHETERS) IMPLANT
CATH BALLN WEDGE 5F 110CM (CATHETERS) IMPLANT
COVER PRB 48X5XTLSCP FOLD TPE (BAG) IMPLANT
DEVICE RAD COMP TR BAND LRG (VASCULAR PRODUCTS) IMPLANT
GLIDESHEATH SLEND SS 6F .021 (SHEATH) IMPLANT
GUIDEWIRE .025 260CM (WIRE) IMPLANT
GUIDEWIRE INQWIRE 1.5J.035X260 (WIRE) IMPLANT
PACK CARDIAC CATHETERIZATION (CUSTOM PROCEDURE TRAY) ×1 IMPLANT
SET ATX-X65L (MISCELLANEOUS) IMPLANT
SHEATH GLIDE SLENDER 4/5FR (SHEATH) IMPLANT
WIRE EMERALD 3MM-J .025X260CM (WIRE) IMPLANT

## 2024-07-16 NOTE — Discharge Instructions (Addendum)
 Radial Site Care  This sheet gives you information about how to care for yourself after your procedure. Your health care provider may also give you more specific instructions. If you have problems or questions, contact your health care provider. What can I expect after the procedure? After the procedure, it is common to have: Bruising and tenderness at the catheter insertion area. Follow these instructions at home: Medicines Take over-the-counter and prescription medicines only as told by your health care provider. Insertion site care Follow instructions from your health care provider about how to take care of your insertion site. Make sure you: Wash your hands with soap and water before you remove your bandage (dressing). If soap and water are not available, use hand sanitizer. May remove dressing in 24 hours. Check your insertion site every day for signs of infection. Check for: Redness, swelling, or pain. Fluid or blood. Pus or a bad smell. Warmth. Do no take baths, swim, or use a hot tub for 5 days. You may shower 24-48 hours after the procedure. Remove the dressing and gently wash the site with plain soap and water. Pat the area dry with a clean towel. Do not rub the site. That could cause bleeding. Do not apply powder or lotion to the site. Activity  For 24 hours after the procedure, or as directed by your health care provider: Do not flex or bend the affected arm. Do not push or pull heavy objects with the affected arm. Do not drive yourself home from the hospital or clinic. You may drive 24 hours after the procedure. Do not operate machinery or power tools. KEEP ARM ELEVATED THE REMAINDER OF THE DAY. Do not push, pull or lift anything that is heavier than 10 lb for 5 days. Ask your health care provider when it is okay to: Return to work or school. Resume usual physical activities or sports. Resume sexual activity. General instructions If the catheter site starts to  bleed, raise your arm and put firm pressure on the site. If the bleeding does not stop, get help right away. This is a medical emergency. DRINK PLENTY OF FLUIDS FOR THE NEXT 2-3 DAYS. No alcohol consumption for 24 hours after receiving sedation. If you went home on the same day as your procedure, a responsible adult should be with you for the first 24 hours after you arrive home. Keep all follow-up visits as told by your health care provider. This is important. Contact a health care provider if: You have a fever. You have redness, swelling, or yellow drainage around your insertion site. Get help right away if: You have unusual pain at the radial site. The catheter insertion area swells very fast. The insertion area is bleeding, and the bleeding does not stop when you hold steady pressure on the area. Your arm or hand becomes pale, cool, tingly, or numb. These symptoms may represent a serious problem that is an emergency. Do not wait to see if the symptoms will go away. Get medical help right away. Call your local emergency services (911 in the [place] Do not drive yourself to the hospital. Summary After the procedure, it is common to have bruising and tenderness at the site. Follow instructions from your health care provider about how to take care of your radial site wound. Check the wound every day for signs of infection.  This information is not intended to replace advice given to you by your health care provider. Make sure you discuss any questions you have with  your health care provider. Document Revised: 11/01/2017 Document Reviewed: 11/01/2017 [employer] Patient Education  2020 [employer]   Brachial Site Care   This sheet gives you information about how to care for yourself after your procedure. Your health care provider may also give you more specific instructions. If you have problems or questions, contact your health care provider. What can I expect after the procedure? After the  procedure, it is common to have: Bruising and tenderness at the catheter insertion area. Follow these instructions at home:  Insertion site care Follow instructions from your health care provider about how to take care of your insertion site. Make sure you: Wash your hands with soap and water before you change your bandage (dressing). If soap and water are not available, use hand sanitizer. Remove your dressing as told by your health care provider. In 24 hours Check your insertion site every day for signs of infection. Check for: Redness, swelling, or pain. Pus or a bad smell. Warmth. You may shower 24-48 hours after the procedure. Do not apply powder or lotion to the site.  Activity For 24 hours after the procedure, or as directed by your health care provider: Do not push or pull heavy objects with the affected arm. Do not drive yourself home from the hospital or clinic. You may drive 24 hours after the procedure unless your health care provider tells you not to. Do not lift anything that is heavier than 10 lb (4.5 kg), or the limit that you are told, until your health care provider says that it is safe.  For 24 hours

## 2024-07-16 NOTE — Interval H&P Note (Signed)
 History and Physical Interval Note:  07/16/2024 11:32 AM  Katie Jacobson  has presented today for surgery, with the diagnosis of Mitral Valve Prolapse/Regurg.  The various methods of treatment have been discussed with the patient and family. After consideration of risks, benefits and other options for treatment, the patient has consented to  Procedure(s): RIGHT/LEFT HEART CATH AND CORONARY ANGIOGRAPHY (N/A) as a surgical intervention.  The patient's history has been reviewed, patient examined, no change in status, stable for surgery.  I have reviewed the patient's chart and labs.  Questions were answered to the patient's satisfaction.     Alm Clay

## 2024-07-17 NOTE — Progress Notes (Unsigned)
 [address] [place] [address]             [phone]    Katie Jacobson [facility] Medical Record #[id number] Date of Birth: 12/28/1955  Referring: Anner Alm ORN, MD Primary Care: Daryl Setter, NP Primary Cardiologist:David Anner, MD  Chief Complaint:   No chief complaint on file.   History of Present Illness:     Katie Jacobson is a 68 y.o. female who presents for surgical evaluation of severe AI and severe MR in the setting of rheumatic valve disease.    TEE: LHC: CT chest: PFTs:   Past Medical and Surgical History: Previous Chest Surgery: *** Previous Chest Radiation: *** Diabetes Mellitus: ***.  HbA1C *** Anticoagulation: ***, Last dose ***  Creatinine:  Lab Results  Component Value Date   CREATININE 0.84 07/01/2024   CREATININE 0.95 06/07/2024   CREATININE 1.01 04/03/2024     Past Medical History:  Diagnosis Date   Allergy    Arthritis    Breast cancer of upper-outer quadrant of right female breast (HCC) 11/14/2014   Treated with chemotherapy and radiation (diagnosed again in March 2024 in both breasts)   Heart murmur    History of chemotherapy    finished chemo 02/03/2015   History of kidney stones    History of seizures    as a child - unknown cause - states was never on anticonvulsants   Hypertension    states under control with meds., has been on med. x years   Mitral regurgitation    Mitral valve prolapse 1990   Rheumatic mitral valve with moderate to severe MR by echo 05/31/2024 => TEE suggests rheumatic mitral valve with severe calcified leaflets and severe mitral regurgitation but also severe aortic regurgitation   Osteoporosis    Personal history of chemotherapy    Personal history of radiation therapy    Port-A-Cath in place 01/24/2023   Seasonal allergies     Past Surgical History:  Procedure Laterality Date   ABDOMINAL HYSTERECTOMY  1998   partial   APPENDECTOMY  1976   BREAST BIOPSY  Right 09/30/2022   US  RT BREAST BX W LOC DEV 1ST LESION IMG BX SPEC US  GUIDE 09/30/2022 [facility]   BREAST BIOPSY Left 09/30/2022   US  LT BREAST BX W LOC DEV 1ST LESION IMG BX SPEC US  GUIDE 09/30/2022 [facility]   BREAST BIOPSY Right 09/30/2022   US  RT BREAST BX W LOC DEV EA ADD LESION IMG BX SPEC US  GUIDE 09/30/2022 [facility]   BREAST BIOPSY  12/14/2022   MM LT RADIOACTIVE SEED LOC MAMMO GUIDE 12/14/2022 [facility]   BREAST LUMPECTOMY Right 02/2015   BREAST LUMPECTOMY WITH RADIOACTIVE SEED AND SENTINEL LYMPH NODE BIOPSY Left 12/15/2022   Procedure: LEFT BREAST LUMPECTOMY WITH RADIOACTIVE SEED AND AXILLARY SENTINEL LYMPH NODE BIOPSY;  Surgeon: Ebbie Cough, MD;  Location: [facility];  Service: General;  Laterality: Left;   COLONOSCOPY  09/30/2016   COMPLEX WOUND CLOSURE N/A 07/18/2023   Procedure: CLOSURE CHEST WALL WOUND X2;  Surgeon: Ebbie Cough, MD;  Location[facility];  Service: General;  Laterality: N/A;   KNEE ARTHROSCOPY Left 03/26/2008   MASTECTOMY     MASTECTOMY W/ SENTINEL NODE BIOPSY Right 12/15/2022   Procedure: RIGHT MASTECTOMY WITH RIGHT AXILLARY SENTINEL LYMPH NODE BIOPSY;  Surgeon: Ebbie Cough, MD;  Location: [facility] OR;  Service: General;  Laterality: Right;  180 MIN ROOM 9   PLANTAR'S  WART EXCISION Left    x 2   PORT-A-CATH REMOVAL Left 04/06/2015   Procedure: REMOVAL PORT-A-CATH;  Surgeon: Donnice Bury, MD;  Location: [facility];  Service: General;  Laterality: Left;   PORT-A-CATH REMOVAL N/A 07/18/2023   Procedure: REMOVAL PORT-A-CATH;  Surgeon: Bury Donnice, MD;  Location: [facility] OR;  Service: General;  Laterality: N/A;   PORTACATH PLACEMENT N/A 11/27/2014   Procedure: INSERTION PORT-A-CATH;  Surgeon: Donnice Bury, MD;  Location: [facility];  Service: General;  Laterality: N/A;   PORTACATH PLACEMENT Right 12/15/2022   Procedure: INSERTION PORT-A-CATH;  Surgeon: Bury Donnice, MD;   Location: [facility] OR;  Service: General;  Laterality: Right;   RADIOACTIVE SEED GUIDED PARTIAL MASTECTOMY WITH AXILLARY SENTINEL LYMPH NODE BIOPSY Right 03/02/2015   Procedure: RADIOACTIVE SEED GUIDED RIGHT PARTIAL MASTECTOMY WITH RIGHT AXILLARY SENTINEL LYMPH NODE BIOPSY;  Surgeon: Donnice Bury, MD;  Location: [facility];  Service: General;  Laterality: Right;   RIGHT/LEFT HEART CATH AND CORONARY ANGIOGRAPHY N/A 07/16/2024   Procedure: RIGHT/LEFT HEART CATH AND CORONARY ANGIOGRAPHY;  Surgeon: Anner Alm ORN, MD;  Location: [facility] INVASIVE CV LAB;  Service: Cardiovascular;  Laterality: N/A;   SALIVARY STONE REMOVAL Right 1972   TONSILLECTOMY  1970s   TRANSESOPHAGEAL ECHOCARDIOGRAM (CATH LAB) N/A 06/21/2024   Procedure: TRANSESOPHAGEAL ECHOCARDIOGRAM;  Surgeon: Barbaraann Darryle Ned, MD;  Location: [facility] INVASIVE CV LAB;  Service: Cardiovascular;  Laterality: N/A;   TRANSTHORACIC ECHOCARDIOGRAM  01/16/2020   EF 60 to 65%.  GRII DD.  No R WMA.  Normal RV size and function.  Rheumatic mitral valve with moderate thickening-hockey-stick appearing.  Moderate MR with mild MS.  Moderate LA dilation.  (Recommend follow-up in 2 to 3 years)   TRANSTHORACIC ECHOCARDIOGRAM  05/31/2022   Normal LV size and function-EF 60 to 65%.  No RWMA. ??  Normal diastolic parameters but elevated LAP?  Normal RV with normal RVP and RAP.  Manage mitral valve with mild MR and no MS.  Mild to moderate AI with aortic sclerosis but no stenosis.   TUBAL LIGATION  1996    Social History:  Social History   Tobacco Use  Smoking Status Some Days   Current packs/day: 0.50   Average packs/day: 0.5 packs/day for 43.0 years (21.5 ttl pk-yrs)   Types: Cigarettes  Smokeless Tobacco Never    Social History   Substance and Sexual Activity  Alcohol Use Yes   Alcohol/week: 5.0 standard drinks of alcohol   Types: 5 Standard drinks or equivalent per week   Comment: occ      Allergies  Allergen Reactions   Sulfa  Antibiotics Other (See Comments)    UNKNOWN    Medications: Asprin: *** Statin: *** Beta Blocker: *** Ace Inhibitor: *** Anti-Coagulation: ***  Current Outpatient Medications  Medication Sig Dispense Refill   albuterol  (VENTOLIN  HFA) 108 (90 Base) MCG/ACT inhaler Inhale 1-2 puffs into the lungs every 6 (six) hours as needed for wheezing or shortness of breath. 1 each 0   aspirin  81 MG tablet Take 1 tablet (81 mg total) by mouth daily. 30 tablet 0   chlorthalidone  (HYGROTON ) 25 MG tablet TAKE 1 TABLET(25 MG) BY MOUTH EVERY OTHER DAY 90 tablet 1   enalapril  (VASOTEC ) 20 MG tablet Take 1 tablet (20 mg total) by mouth daily. 90 tablet 1   fluticasone  (FLONASE ) 50 MCG/ACT nasal spray Place 1 spray into both nostrils as needed for allergies or rhinitis.     metoprolol  succinate (TOPROL -XL) 100 MG 24  hr tablet Take 1 tablet (100 mg total) by mouth daily. TAKE WITH OR IMMEDIATELY FOLLOWING A MEAL. 90 tablet 1   Vitamin D , Ergocalciferol , (DRISDOL ) 1.25 MG (50000 UNIT) CAPS capsule Take 1 capsule (50,000 Units total) by mouth every 7 (seven) days. (Patient not taking: No sig reported) 12 capsule 0   No current facility-administered medications for this visit.    (Not in a hospital admission)   Family History  Problem Relation Age of Onset   Heart disease Mother    Stroke Mother    Hypertension Mother    Diabetes Mother    Heart attack Father    Diabetes Sister    Diabetes Sister    Kidney disease Brother    Mental retardation Brother    Colon polyps Daughter    Cancer Cousin    Breast cancer Cousin        deceased 32   Colon cancer Neg Hx    Esophageal cancer Neg Hx    Rectal cancer Neg Hx    Stomach cancer Neg Hx      Review of Systems:   ROS    Physical Exam: LMP 10/10/1996  Physical Exam    Diagnostic Studies & Laboratory data: Cardiac Studies & Procedures   ______________________________________________________________________________________________ CARDIAC  CATHETERIZATION  CARDIAC CATHETERIZATION 07/16/2024  Conclusion Table formatting from the original result was not included. Images from the original result were not included.    Prox LAD to Mid LAD lesion is 50% stenosed.   Hemodynamic findings consistent with mild pulmonary hypertension.  PAP-mean 48/16-29 mmHg with PCWP of 22 to 24 mmHg   There is no aortic valve stenosis.-Unable to fully assess for AI.   There is severe MR by Echo/TEE   In the absence of any other complications or medical issues, we expect the patient to be ready for discharge from a cath perspective on 07/16/2024.   She will proceed with planned T CTS consultation for mitral and aortic valve disease.   Recommend Aspirin  81mg  daily for moderate CAD.  Dominance: Left   Angiographically moderate single-vessel disease with segmental 50% calcified stenosis in the mid LAD from SP1-D2 Mild Pulmonary Hypertension with mean PAP 29 mL of mercury and PCWP of 22 to 24 mmHg -> WHO Class II (due to valvular disease) Due to Valvular Disease, Cardiac Output and Index are mild to moderately reduced.    RECOMMENDATONS   In the absence of any other complications or medical issues, we expect the patient to be ready for discharge from a cath perspective on 07/16/2024.   She will proceed with planned TCTS consultation for mitral and aortic valve disease.   Recommend Aspirin  81mg  daily for moderate CAD.    Alm Clay, MD  Findings Coronary Findings Diagnostic  Dominance: Left  Left Main Vessel was injected. Vessel is moderate in size. Vessel is angiographically normal. Short Left Main  Left Anterior Descending Prox LAD to Mid LAD lesion is 50% stenosed. The lesion is located at the major branch, segmental, eccentric and concentric.  First Diagonal Branch Vessel is small in size.  First Septal Branch Vessel is small in size.  Second Diagonal Branch Vessel is small in size.  Left Circumflex  First Obtuse Marginal  Branch Vessel is small in size.  Second Obtuse Marginal Branch Vessel is small in size.  Lateral Third Obtuse Marginal Branch Vessel is small in size.  Left Posterior Descending Artery Vessel is small in size.  First Left Posterolateral Branch Vessel is small in  size.  Second Left Posterolateral Branch Vessel is small in size.  Right Coronary Artery Vessel was injected. Vessel is small. The vessel exhibits minimal luminal irregularities.  Right Ventricular Branch Vessel is small in size.  Intervention  No interventions have been documented.   STRESS TESTS  NM MYOCAR MULTI W/SPECT W 07/01/1996   ECHOCARDIOGRAM  ECHOCARDIOGRAM COMPLETE 05/31/2024  Narrative ECHOCARDIOGRAM REPORT    Patient Name:   Katie Jacobson Date of Exam: 05/31/2024 Medical Rec #:  [id number]        Height:       61.0 in Accession #:    [id number]       Weight:       213.0 lb Date of Birth:  [birth year]       BSA:          1.940 m Patient Age:    67 years         BP:           130/53 mmHg Patient Gender: F                HR:           85 bpm. Exam Location:  [address]  Procedure: 2D Echo, Cardiac Doppler and Color Doppler (Both Spectral and Color Flow Doppler were utilized during procedure).  Indications:    I34.1 MVP  History:        Patient has prior history of Echocardiogram examinations, most recent 05/31/2022. Mitral Valve Prolapse and MR; Risk Factors:Obesity, Current Smoker, Hypertension and Dyslipidemia.  Sonographer:    Elsie Bohr RDCS Referring Phys: [id number] DAVID W HARDING  IMPRESSIONS   1. Moderate to severe mitral regurgitation is now present. No stenosis. Would consider TEE for quantification of MR. The mitral valve is rheumatic. Moderate to severe mitral valve regurgitation. The mean mitral valve gradient is 2.5 mmHg with average heart rate of 63 bpm. 2. Left ventricular ejection fraction, by estimation, is 55 to 60%. The left ventricle has normal function. The  left ventricle has no regional wall motion abnormalities. Left ventricular diastolic function could not be evaluated. 3. Right ventricular systolic function is normal. The right ventricular size is mildly enlarged. There is normal pulmonary artery systolic pressure. The estimated right ventricular systolic pressure is 31.3 mmHg. 4. Left atrial size was severely dilated. 5. The aortic valve is tricuspid. Aortic valve regurgitation is moderate. No aortic stenosis is present. 6. The inferior vena cava is normal in size with greater than 50% respiratory variability, suggesting right atrial pressure of 3 mmHg.  FINDINGS Left Ventricle: Left ventricular ejection fraction, by estimation, is 55 to 60%. The left ventricle has normal function. The left ventricle has no regional wall motion abnormalities. The left ventricular internal cavity size was normal in size. There is no left ventricular hypertrophy. Left ventricular diastolic function could not be evaluated due to mitral regurgitation (moderate or greater). Left ventricular diastolic function could not be evaluated.  Right Ventricle: The right ventricular size is mildly enlarged. No increase in right ventricular wall thickness. Right ventricular systolic function is normal. There is normal pulmonary artery systolic pressure. The tricuspid regurgitant velocity is 2.66 m/s, and with an assumed right atrial pressure of 3 mmHg, the estimated right ventricular systolic pressure is 31.3 mmHg.  Left Atrium: Left atrial size was severely dilated.  Right Atrium: Right atrial size was normal in size.  Pericardium: There is no evidence of pericardial effusion.  Mitral Valve: Moderate to severe mitral regurgitation is now  present. No stenosis. Would consider TEE for quantification of MR. The mitral valve is rheumatic. There is moderate calcification of the anterior mitral valve leaflet(s). Moderate to severe mitral valve regurgitation. The mean mitral valve  gradient is 2.5 mmHg with average heart rate of 63 bpm.  Tricuspid Valve: The tricuspid valve is grossly normal. Tricuspid valve regurgitation is mild . No evidence of tricuspid stenosis.  Aortic Valve: The aortic valve is tricuspid. Aortic valve regurgitation is moderate. Aortic regurgitation PHT measures 540 msec. No aortic stenosis is present.  Pulmonic Valve: The pulmonic valve was grossly normal. Pulmonic valve regurgitation is trivial. No evidence of pulmonic stenosis.  Aorta: The aortic root and ascending aorta are structurally normal, with no evidence of dilitation.  Venous: The inferior vena cava is normal in size with greater than 50% respiratory variability, suggesting right atrial pressure of 3 mmHg.  IAS/Shunts: The atrial septum is grossly normal.   LEFT VENTRICLE PLAX 2D LVIDd:         5.20 cm   Diastology LVIDs:         3.30 cm   LV e' medial:    6.56 cm/s LV PW:         0.90 cm   LV E/e' medial:  19.4 LV IVS:        0.90 cm   LV e' lateral:   6.31 cm/s LVOT diam:     1.80 cm   LV E/e' lateral: 20.2 LV SV:         67 LV SV Index:   35 LVOT Area:     2.54 cm   RIGHT VENTRICLE            IVC RVSP:           31.3 mmHg  IVC diam: 1.00 cm  LEFT ATRIUM              Index        RIGHT ATRIUM           Index LA diam:        5.10 cm  2.63 cm/m   RA Pressure: 3.00 mmHg LA Vol (A2C):   76.5 ml  39.43 ml/m  RA Area:     17.50 cm LA Vol (A4C):   126.0 ml 64.95 ml/m  RA Volume:   49.90 ml  25.72 ml/m LA Biplane Vol: 106.0 ml 54.64 ml/m AORTIC VALVE LVOT Vmax:   125.20 cm/s LVOT Vmean:  77.160 cm/s LVOT VTI:    0.264 m AI PHT:      540 msec  AORTA Ao Root diam: 2.50 cm Ao Asc diam:  3.00 cm  MITRAL VALVE                TRICUSPID VALVE MV Area (PHT): 2.39 cm     TR Peak grad:   28.3 mmHg MV Mean grad:  2.5 mmHg     TR Vmax:        266.00 cm/s MV Decel Time: 317 msec     Estimated RAP:  3.00 mmHg MV E velocity: 127.40 cm/s  RVSP:           31.3 mmHg MV A  velocity: 117.80 cm/s MV E/A ratio:  1.08         SHUNTS Systemic VTI:  0.26 m Systemic Diam: 1.80 cm  Darryle Decent MD Electronically signed by Darryle Decent MD Signature Date/Time: 05/31/2024/3:18:58 PM    Final   TEE  ECHO TEE 06/21/2024  Narrative  TRANSESOPHOGEAL ECHO REPORT    Patient Name:   Katie Jacobson Date of Exam: 06/21/2024 Medical Rec #:  [id number]        Height:       61.0 in Accession #:    [id number]       Weight:       217.7 lb Date of Birth:  04-02-1956       BSA:          1.958 m Patient Age:    67 years         BP:           131/72 mmHg Patient Gender: F                HR:           78 bpm. Exam Location:  Inpatient  Procedure: Transesophageal Echo, Cardiac Doppler, Color Doppler, Saline Contrast Bubble Study, 3D Echo and 2D Echo (Both Spectral and Color Flow Doppler were utilized during procedure).  Indications:     Mitral Regurgitation  History:         Patient has prior history of Echocardiogram examinations, most recent 05/31/2024. Risk Factors:Hypertension, Dyslipidemia and Current Smoker. Breast cancer.  Sonographer:     Thea Norlander RCS Referring Phys:  [id number] DARRYLE NED O'NEAL Diagnosing Phys: DARRYLE Decent MD  PROCEDURE: After discussion of the risks and benefits of a TEE, an informed consent was obtained from the patient. TEE procedure time was 29 minutes. The transesophogeal probe was passed without difficulty through the esophogus of the patient. Imaged were obtained with the patient in a left lateral decubitus position. Sedation performed by different physician. The patient was monitored while under deep sedation. Anesthestetic sedation was provided intravenously by Anesthesiology: 530.34mg  of Propofol . Image quality was excellent. The patient's vital signs; including heart rate, blood pressure, and oxygen saturation; remained stable throughout the procedure. The patient developed no complications during the  procedure.  IMPRESSIONS   1. Rheumatic mitral valve. The leaflets are severely calcificed. There is severe mitral regurgitation (2D ERO 0.99 cm2, R vol 99 cc). Mild to moderate mitral stenosis with mean gradient 3 mmHG @ 76 bpm. MVA by PHT is 1.80 cm2. MVA by direct 3D assessment 1.60 cm2. There is systolic blunting in all 4 pulmonary veins. The jet is central and the LA is severely dilated so systolic reversal of flow is unlikely. The mitral valve is rheumatic. Severe mitral valve regurgitation. Mild to moderate mitral stenosis. The mean mitral valve gradient is 3.0 mmHg with average heart rate of 76 bpm. 2. Tricuspid aortic valve with restricted leaflet closure in diastole. Leaflet tips are thickened. 2D ERO 0.23 cm2, R vol 42 cc, RF 49%. There is holodiastolic flow reveral in the descending aorta. This is consistent with severe aortic regurgitation. The aortic valve is tricuspid. Aortic valve regurgitation is severe. Aortic valve area, by VTI measures 2.02 cm. Aortic valve mean gradient measures 7.0 mmHg. Aortic valve Vmax measures 1.96 m/s. 3. Left ventricular ejection fraction, by estimation, is 55 to 60%. The left ventricle has normal function. The left ventricular internal cavity size was mildly dilated. 4. Right ventricular systolic function is normal. The right ventricular size is normal. 5. Left atrial size was severely dilated. No left atrial/left atrial appendage thrombus was detected. The LAA emptying velocity was 27 cm/s. 6. Right atrial size was mildly dilated. 7. Agitated saline contrast bubble study was negative, with no evidence of any interatrial shunt. 8. 3D performed of the mitral  valve and 3D performed of the aortic valve and demonstrates 3D assessment of the MV and AoV performed.  FINDINGS Left Ventricle: Left ventricular ejection fraction, by estimation, is 55 to 60%. The left ventricle has normal function. The left ventricular internal cavity size was mildly  dilated.  Right Ventricle: The right ventricular size is normal. No increase in right ventricular wall thickness. Right ventricular systolic function is normal.  Left Atrium: Left atrial size was severely dilated. No left atrial/left atrial appendage thrombus was detected. The LAA emptying velocity was 27 cm/s.  Right Atrium: Right atrial size was mildly dilated.  Pericardium: There is no evidence of pericardial effusion.  Mitral Valve: Rheumatic mitral valve. The leaflets are severely calcificed. There is severe mitral regurgitation (2D ERO 0.99 cm2, R vol 99 cc). Mild to moderate mitral stenosis with mean gradient 3 mmHG @ 76 bpm. MVA by PHT is 1.80 cm2. MVA by direct 3D assessment 1.60 cm2. There is systolic blunting in all 4 pulmonary veins. The jet is central and the LA is severely dilated so systolic reversal of flow is unlikely. The mitral valve is rheumatic. Severe mitral valve regurgitation. Mild to moderate mitral valve stenosis. MV peak gradient, 8.8 mmHg. The mean mitral valve gradient is 3.0 mmHg with average heart rate of 76 bpm.  Tricuspid Valve: The tricuspid valve is grossly normal. Tricuspid valve regurgitation is mild . No evidence of tricuspid stenosis.  Aortic Valve: Tricuspid aortic valve with restricted leaflet closure in diastole. Leaflet tips are thickened. 2D ERO 0.23 cm2, R vol 42 cc, RF 49%. There is holodiastolic flow reveral in the descending aorta. This is consistent with severe aortic regurgitation. The aortic valve is tricuspid. Aortic valve regurgitation is severe. Aortic valve mean gradient measures 7.0 mmHg. Aortic valve peak gradient measures 15.4 mmHg. Aortic valve area, by VTI measures 2.02 cm.  Pulmonic Valve: The pulmonic valve was grossly normal. Pulmonic valve regurgitation is trivial. No evidence of pulmonic stenosis.  Aorta: The aortic root and ascending aorta are structurally normal, with no evidence of dilitation.  IAS/Shunts: There is right  bowing of the interatrial septum, suggestive of elevated left atrial pressure. No atrial level shunt detected by color flow Doppler. Agitated saline contrast was given intravenously to evaluate for intracardiac shunting. Agitated saline contrast bubble study was negative, with no evidence of any interatrial shunt.  Additional Comments: 3D was performed not requiring image post processing on an independent workstation and was abnormal. Spectral Doppler performed.  LEFT VENTRICLE PLAX 2D LVOT diam:     1.89 cm LV SV:         86 LV SV Index:   44 LVOT Area:     2.81 cm   AORTIC VALVE AV Area (Vmax):    2.00 cm AV Area (Vmean):   1.88 cm AV Area (VTI):     2.02 cm AV Vmax:           196.00 cm/s AV Vmean:          123.000 cm/s AV VTI:            0.425 m AV Peak Grad:      15.4 mmHg AV Mean Grad:      7.0 mmHg LVOT Vmax:         140.00 cm/s LVOT Vmean:        82.600 cm/s LVOT VTI:          0.306 m LVOT/AV VTI ratio: 0.72  AORTA Ao Root diam: 2.79 cm Ao  Asc diam:  3.00 cm  MITRAL VALVE              TRICUSPID VALVE MV Area (PHT): 1.80 cm   TR Peak grad:   28.9 mmHg MV Peak grad:  8.8 mmHg   TR Vmax:        269.00 cm/s MV Mean grad:  3.0 mmHg MV Vmax:       1.48 m/s   SHUNTS MV Vmean:      83.3 cm/s  Systemic VTI:  0.31 m Systemic Diam: 1.89 cm  Darryle Decent MD Electronically signed by Darryle Decent MD Signature Date/Time: 06/21/2024/1:34:09 PM    Final        ______________________________________________________________________________________________     EKG: *** I have independently reviewed the above radiologic studies and discussed with the patient   Recent Lab Findings: Lab Results  Component Value Date   WBC 6.3 07/01/2024   HGB 12.9 07/16/2024   HCT 38.0 07/16/2024   PLT 221 07/01/2024   GLUCOSE 78 07/01/2024   CHOL 199 09/22/2023   TRIG 64 09/22/2023   HDL 75 09/22/2023   LDLCALC 109 (H) 09/22/2023   ALT 13 09/22/2023   AST 14 09/22/2023    NA 125 (L) 07/16/2024   K 2.8 (L) 07/16/2024   CL 101 07/01/2024   CREATININE 0.84 07/01/2024   BUN 17 07/01/2024   CO2 23 07/01/2024   TSH 0.83 10/15/2019      Assessment / Plan:   68 y.o. female with ***     I  spent {CHL ONC TIME VISIT - DTPQU:[id number]} counseling the patient face to face.   Con RAMAN Tekeya Geffert 07/17/2024 11:42 AM

## 2024-07-18 ENCOUNTER — Ambulatory Visit

## 2024-07-18 ENCOUNTER — Encounter (HOSPITAL_COMMUNITY): Payer: Self-pay | Admitting: Emergency Medicine

## 2024-07-18 VITALS — BP 157/77 | HR 77 | Resp 20 | Ht 61.0 in | Wt 217.0 lb

## 2024-07-18 DIAGNOSIS — I080 Rheumatic disorders of both mitral and aortic valves: Secondary | ICD-10-CM

## 2024-07-21 ENCOUNTER — Other Ambulatory Visit: Payer: Self-pay

## 2024-07-21 ENCOUNTER — Encounter (HOSPITAL_BASED_OUTPATIENT_CLINIC_OR_DEPARTMENT_OTHER): Payer: Self-pay | Admitting: Emergency Medicine

## 2024-07-21 ENCOUNTER — Emergency Department (HOSPITAL_BASED_OUTPATIENT_CLINIC_OR_DEPARTMENT_OTHER)
Admission: EM | Admit: 2024-07-21 | Discharge: 2024-07-21 | Disposition: A | Discharge status: Home / Self Care | Attending: Emergency Medicine | Admitting: Emergency Medicine

## 2024-07-21 DIAGNOSIS — M96841 Postprocedural hematoma of a musculoskeletal structure following other procedure: Secondary | ICD-10-CM | POA: Diagnosis not present

## 2024-07-21 DIAGNOSIS — Z955 Presence of coronary angioplasty implant and graft: Secondary | ICD-10-CM | POA: Insufficient documentation

## 2024-07-21 DIAGNOSIS — Z79899 Other long term (current) drug therapy: Secondary | ICD-10-CM | POA: Diagnosis not present

## 2024-07-21 DIAGNOSIS — Z853 Personal history of malignant neoplasm of breast: Secondary | ICD-10-CM | POA: Insufficient documentation

## 2024-07-21 DIAGNOSIS — S60211A Contusion of right wrist, initial encounter: Secondary | ICD-10-CM | POA: Insufficient documentation

## 2024-07-21 DIAGNOSIS — S6991XA Unspecified injury of right wrist, hand and finger(s), initial encounter: Secondary | ICD-10-CM | POA: Diagnosis present

## 2024-07-21 DIAGNOSIS — Z7982 Long term (current) use of aspirin: Secondary | ICD-10-CM | POA: Diagnosis not present

## 2024-07-21 DIAGNOSIS — X58XXXA Exposure to other specified factors, initial encounter: Secondary | ICD-10-CM | POA: Insufficient documentation

## 2024-07-21 DIAGNOSIS — I10 Essential (primary) hypertension: Secondary | ICD-10-CM | POA: Diagnosis not present

## 2024-07-21 NOTE — ED Provider Notes (Signed)
 [place] [facility] DEPARTMENT AT [facility] Provider Note   CSN: [id number] Arrival date & time: 07/21/24  1701     Patient presents with: Post-op Problem   Katie Jacobson is a 68 y.o. female with past medical history of breast cancer, hypertension who recently underwent a right heart cath on 10/7 who presents emergency department for evaluation of bruising on her right wrist at the catheter insertion site.  Patient states she noticed the bruising about an hour ago and she was changing her bandage.  She reports some pain, although mild.  No current numbness or tingling.  Patient able to go about her daily activities without difficulty.   HPI     Prior to Admission medications   Medication Sig Start Date End Date Taking? Authorizing Provider  albuterol  (VENTOLIN  HFA) 108 (90 Base) MCG/ACT inhaler Inhale 1-2 puffs into the lungs every 6 (six) hours as needed for wheezing or shortness of breath. 05/23/22   O'Sullivan, Melissa, NP  aspirin  81 MG tablet Take 1 tablet (81 mg total) by mouth daily. 04/21/15   Magrinat, Sandria BROCKS, MD  chlorthalidone  (HYGROTON ) 25 MG tablet TAKE 1 TABLET(25 MG) BY MOUTH EVERY OTHER DAY 04/03/24   Daryl Setter, NP  enalapril  (VASOTEC ) 20 MG tablet Take 1 tablet (20 mg total) by mouth daily. 04/03/24   O'Sullivan, Melissa, NP  fluticasone  (FLONASE ) 50 MCG/ACT nasal spray Place 1 spray into both nostrils as needed for allergies or rhinitis.    [provider]  metoprolol  succinate (TOPROL -XL) 100 MG 24 hr tablet Take 1 tablet (100 mg total) by mouth daily. TAKE WITH OR IMMEDIATELY FOLLOWING A MEAL. 04/03/24   O'Sullivan, Melissa, NP  Vitamin D , Ergocalciferol , (DRISDOL ) 1.25 MG (50000 UNIT) CAPS capsule Take 1 capsule (50,000 Units total) by mouth every 7 (seven) days. Patient not taking: Reported on 07/18/2024 04/05/24   O'Sullivan, Melissa, NP    Allergies: Sulfa antibiotics    Review of Systems  Constitutional:  Negative for chills  and fever.  Eyes:  Negative for visual disturbance.  Respiratory:  Negative for cough and shortness of breath.   Cardiovascular:  Negative for chest pain and palpitations.  Gastrointestinal:  Negative for abdominal pain and vomiting.  Genitourinary:  Negative for dysuria and hematuria.  Musculoskeletal:  Negative for arthralgias and back pain.  Skin:  Positive for color change. Negative for rash.  Neurological:  Negative for seizures and syncope.  All other systems reviewed and are negative.   Updated Vital Signs BP 137/70 (BP Location: Left Arm)   Pulse 74   Temp 98.1 F (36.7 C) (Oral)   Resp 20   Ht 5' 1 (1.549 m)   Wt 98.4 kg   LMP 10/10/1996   SpO2 99%   BMI 41.00 kg/m   Physical Exam Vitals and nursing note reviewed.  Constitutional:      Appearance: Normal appearance. She is not ill-appearing.  Eyes:     General: No scleral icterus. Pulmonary:     Effort: Pulmonary effort is normal. No respiratory distress.  Musculoskeletal:        General: No deformity.  Skin:    Coloration: Skin is not jaundiced.     Findings: Bruising present.     Comments: Right wrist bruising proximal to radial artery  Neurological:     General: No focal deficit present.     Mental Status: She is alert.  Psychiatric:        Mood and Affect: Mood normal.     (  all labs ordered are listed, but only abnormal results are displayed) Labs Reviewed - No data to display  EKG: None  Radiology: No results found.  Procedures   Medications Ordered in the ED - No data to display                              Medical Decision Making  This patient presents to the ED for concern of right wrist pain, this involves an extensive number of treatment options, and is a complaint that carries with it a high risk of complications and morbidity.  Differential diagnosis includes: Hematoma, fracture, compartment syndrome, cellulitis, thrombophlebitis  Co morbidities:  hypertension   Additional  history:  Recent heart cath on 10/7 performed by Dr. Alm Clay  Lab Tests:  Not indicated  Imaging Studies:  Not indicated   Medicines ordered and prescription drug management:  No medication indicated at this time  Test Considered:   none  Critical Interventions:   none  Consultations Obtained: None  Problem List / ED Course:     ICD-10-CM   1. Hematoma of right wrist  D39.788J       MDM: 68 year old female who underwent heart cath and coronary angiography on 10/7.  She presents for bruising proximal to her catheter insertion site on the right wrist.  Radial pulse intact.  Full range of motion noted above and below hematoma.  No evidence of infection or cellulitis.  No erythema.  Minimal pain.  No palpable cord.  Patient's symptoms are likely secondary to removal of catheter last week.  Patient provided ice pack and given return precautions should her symptoms worsen.  I told her to follow-up with her cardiologist to inform him of her visit to the emergency department.  Patient agreeable to plan.  Hemodynamically stable.  Patient stable for discharge at this time.   Dispostion:  After consideration of the diagnostic results and the patients response to treatment, I feel that the patient would benefit from supportive care.  Final diagnoses:  Hematoma of right wrist    ED Discharge Orders     None          Katie Marry RAMAN, PA-C 07/21/24 1801    Katie Jayson LABOR, DO 07/24/24 [phone]

## 2024-07-21 NOTE — Discharge Instructions (Signed)
 It was a pleasure taking care of you today. You were seen in the Emergency Department for right wrist pain. Your work-up was reassuring.  Your symptoms are likely due to bruising after your cardiac procedure last week.  I recommend applying ice for 20 minutes at a time as needed for pain.  Follow up with her cardiologist if your symptoms continue.  He is return to the emergency department if the area becomes more painful, spreads, or you lose sensation in the hand.  Please return to the ER if you experience chest pain, trouble breathing, intractable nausea/vomiting or any other life threatening illnesses.

## 2024-07-21 NOTE — ED Triage Notes (Addendum)
 Cardiac cath on 10/7 (right wrist) Bruising and tinging started today. Denies pain.

## 2024-07-22 ENCOUNTER — Telehealth: Payer: Self-pay | Admitting: Cardiology

## 2024-07-22 NOTE — Telephone Encounter (Signed)
 Patient was seen in ED yesterday for bruising at cath site. Patient had cath on 10/7 and bruising did not appear until 10/12. Minimal pain and swelling, not felt to be an acute issue, ED recommended self-care at home applying ice to site. Patient reports change, site is doing okay today.  Patient also reports pain in her right calf. She states is feels sore, similar to soreness after a cramp but she has not had any cramping that she is aware of. No redness/discoloration, warmth or swelling to calf. Patient states pain was worse yesterday than it is today.  She states she was advised by ED to call and make Dr. Anner aware. Will forward to Dr. Anner to review.

## 2024-07-22 NOTE — Telephone Encounter (Signed)
 Pt had cath on 07/16/24. On 07/21/24 her right  arm turned black and blue. She went the [facility] and they told her to call and let us  know. Pt also states she is having right calf pain.

## 2024-07-23 ENCOUNTER — Ambulatory Visit (HOSPITAL_COMMUNITY)
Admission: RE | Admit: 2024-07-23 | Discharge: 2024-07-23 | Disposition: A | Discharge status: Home / Self Care | Source: Ambulatory Visit

## 2024-07-23 ENCOUNTER — Other Ambulatory Visit: Payer: Self-pay | Admitting: Family

## 2024-07-23 ENCOUNTER — Other Ambulatory Visit: Payer: Self-pay

## 2024-07-23 DIAGNOSIS — I2510 Atherosclerotic heart disease of native coronary artery without angina pectoris: Secondary | ICD-10-CM | POA: Diagnosis not present

## 2024-07-23 DIAGNOSIS — I700 Atherosclerosis of aorta: Secondary | ICD-10-CM | POA: Diagnosis not present

## 2024-07-23 DIAGNOSIS — I080 Rheumatic disorders of both mitral and aortic valves: Secondary | ICD-10-CM

## 2024-07-23 DIAGNOSIS — Z9011 Acquired absence of right breast and nipple: Secondary | ICD-10-CM

## 2024-07-23 DIAGNOSIS — Z853 Personal history of malignant neoplasm of breast: Secondary | ICD-10-CM

## 2024-07-23 MED ORDER — METOPROLOL TARTRATE 5 MG/5ML IV SOLN
INTRAVENOUS | Status: AC
  Filled 2024-07-23: qty 10

## 2024-07-23 MED ORDER — NITROGLYCERIN 0.4 MG SL SUBL
0.8000 mg | SUBLINGUAL_TABLET | Freq: Once | SUBLINGUAL | Status: AC
  Administered 2024-07-23: 0.8 mg via SUBLINGUAL

## 2024-07-23 MED ORDER — IOHEXOL 350 MG/ML SOLN
100.0000 mL | Freq: Once | INTRAVENOUS | Status: AC | PRN
  Administered 2024-07-23: 100 mL via INTRAVENOUS

## 2024-07-23 MED ORDER — METOPROLOL TARTRATE 5 MG/5ML IV SOLN
10.0000 mg | Freq: Once | INTRAVENOUS | Status: DC | PRN
Start: 2024-07-23 — End: 2024-07-24

## 2024-07-23 MED ORDER — DILTIAZEM HCL 25 MG/5ML IV SOLN: 10.0000 mg | INTRAVENOUS | Status: DC | PRN

## 2024-07-23 NOTE — Progress Notes (Signed)
 Patient presented for cardiac CT.  IV started with US  guidance.  IV extravasation during contrast injection, injection stopped.   Mild swelling noted to area (left [facility]), no redness, no numbness/tingling, +2 radial pulse.    Ice pack and pressure dressing applied.  Precautions discussed, education sheet provided.    Unable to obtain additional IV to complete study.  Dr. Barbaraann made aware.

## 2024-07-24 ENCOUNTER — Telehealth (HOSPITAL_COMMUNITY): Payer: Self-pay | Admitting: *Deleted

## 2024-07-24 ENCOUNTER — Other Ambulatory Visit (HOSPITAL_COMMUNITY): Payer: Self-pay

## 2024-07-24 DIAGNOSIS — I2510 Atherosclerotic heart disease of native coronary artery without angina pectoris: Secondary | ICD-10-CM

## 2024-07-24 NOTE — Telephone Encounter (Signed)
 Called patient to follow up on IV extravasation during cardiac CTA yesterday.  Patient reports swelling has decreased but still present.  Denies redness, streaking, numbness or tingling.   Advised to call back with any questions or concerns.    Appt has been rescheduled for 10/21.

## 2024-07-30 ENCOUNTER — Ambulatory Visit (HOSPITAL_COMMUNITY)
Admission: RE | Admit: 2024-07-30 | Discharge: 2024-07-30 | Disposition: A | Discharge status: Home / Self Care | Source: Ambulatory Visit

## 2024-07-30 ENCOUNTER — Encounter (HOSPITAL_COMMUNITY): Payer: Self-pay

## 2024-07-30 ENCOUNTER — Ambulatory Visit (HOSPITAL_COMMUNITY)

## 2024-07-30 DIAGNOSIS — I2510 Atherosclerotic heart disease of native coronary artery without angina pectoris: Secondary | ICD-10-CM | POA: Insufficient documentation

## 2024-07-30 MED ORDER — NITROGLYCERIN 0.4 MG SL SUBL
0.8000 mg | SUBLINGUAL_TABLET | Freq: Once | SUBLINGUAL | Status: AC
  Administered 2024-07-30: 0.8 mg via SUBLINGUAL

## 2024-07-30 MED ORDER — IOHEXOL 350 MG/ML SOLN: 100.0000 mL | Freq: Once | INTRAVENOUS | Status: DC | PRN

## 2024-07-30 NOTE — Progress Notes (Signed)
 Patient presents for a cardiac CT scan.  An ultrasound guided 20g diffusics IV was placed in patient's left arm in the antecubital space.  During contrast injection for the study, the IV extravasated.  Pt did not report any discomfort but swelling is noted at the injection site.  Patient has +2 radial pulse distal to the site and full ROM.  Dr. Barbaraann was called and made aware.  He recommended a pressure dressing and a heat pack. He stated that the patient is ok to go home.  Patient was given instructions for care and when to seek medical attention. She verbalized understanding.  Chantal Requena RN Navigator Cardiac Imaging [facility] [phone] Office [phone] Cell

## 2024-08-01 ENCOUNTER — Other Ambulatory Visit: Payer: Self-pay

## 2024-08-01 ENCOUNTER — Encounter: Payer: Self-pay | Admitting: *Deleted

## 2024-08-01 ENCOUNTER — Other Ambulatory Visit: Payer: Self-pay | Admitting: *Deleted

## 2024-08-01 ENCOUNTER — Ambulatory Visit

## 2024-08-01 VITALS — BP 161/81 | HR 77 | Resp 20 | Ht 61.0 in | Wt 217.3 lb

## 2024-08-01 DIAGNOSIS — I050 Rheumatic mitral stenosis: Secondary | ICD-10-CM

## 2024-08-01 DIAGNOSIS — I080 Rheumatic disorders of both mitral and aortic valves: Secondary | ICD-10-CM

## 2024-08-01 DIAGNOSIS — I351 Nonrheumatic aortic (valve) insufficiency: Secondary | ICD-10-CM

## 2024-08-01 NOTE — Progress Notes (Signed)
 [address] [place] [address]             [phone]       HPI:  Katie Jacobson is a 68 year old woman with severe MR, mild to mod MS, and severe AI secondary to rheumatic valvular disease, who presents for further discussion of surgery.     Since her last visit, we attempted to get a CTA TAVR and CT FFR of her LAD, however she had contrast extravasation on two attempts.  I reviewed her cath with several cardiologists who don't feel the LAD disease is significant and does not need to be bypassed.  Overall, Katie Jacobson still feels well.  She felt a little woozy this morning but likely anxious in anticipation of today's visit.  She otherwise has remained active and busy but still has progressive dyspnea.    RHC: Pamean: 48, PCW 21-24, LVEDP 22, CI 2.26  Allergies as of 08/01/2024       Reactions   Sulfa Antibiotics Other (See Comments)   UNKNOWN        Medication List        Accurate as of August 01, 2024  2:48 PM. If you have any questions, ask your nurse or doctor.          STOP taking these medications    Vitamin D  (Ergocalciferol ) 1.25 MG (50000 UNIT) Caps capsule Commonly known as: DRISDOL  Stopped by: Con RAMAN Shaye Elling       TAKE these medications    albuterol  108 (90 Base) MCG/ACT inhaler Commonly known as: VENTOLIN  HFA Inhale 1-2 puffs into the lungs every 6 (six) hours as needed for wheezing or shortness of breath.   aspirin  81 MG tablet Take 1 tablet (81 mg total) by mouth daily.   chlorthalidone  25 MG tablet Commonly known as: HYGROTON  TAKE 1 TABLET(25 MG) BY MOUTH EVERY OTHER DAY   enalapril  20 MG tablet Commonly known as: VASOTEC  Take 1 tablet (20 mg total) by mouth daily.   fluticasone  50 MCG/ACT nasal spray Commonly known as: FLONASE  Place 1 spray into both nostrils as needed for allergies or rhinitis.   metoprolol  succinate 100 MG 24 hr tablet Commonly known as: TOPROL -XL Take 1 tablet (100 mg total) by mouth  daily. TAKE WITH OR IMMEDIATELY FOLLOWING A MEAL.        BP (!) 161/81 (BP Location: Left Arm, Patient Position: Sitting, Cuff Size: Normal) Comment (Patient Position): forearm  Pulse 77   Resp 20   Ht 5' 1 (1.549 m)   Wt 217 lb 4.8 oz (98.6 kg)   LMP 10/10/1996   SpO2 97% Comment: RA  BMI 41.06 kg/m   Physical Exam: General - Sitting comfortably in chair, no distress CV - RRR; right mastectomy incision with dressing in place Resp - Unlabored on room air Abd - Soft, ND/NT Ext - no leg edema    Imaging: CT chest - no significant aortic calcification.   Assessment/Plan: Katie Jacobson is a 68 y.o. female who presents for surgical evaluation of severe AI/moderate AS and severe MR/mild-mod MS in the setting of rheumatic valve disease. While she has remained very functional, she has progressively worsening shortness of breath.  She is still actively smoking but knows she needs to quit.  Her left atrium is severely dilated and she is at high risk for atrial fibrillation post-operatively so we will clip her left atrial appendage at the time of  surgery.  I discussed the general nature of the procedure, including the need for general anesthesia, the incisions to be used, the use of cardiopulmonary bypass, and the use of temporary pacemaker wires and drainage tubes postoperatively with Katie Jacobson and her husband.  We discussed the expected hospital stay, overall recovery and short and long term outcomes. I informed them of the indications, risks, benefits and alternatives.   They understands the risks include, but are not limited to death, stroke, MI, DVT/PE, bleeding, possible need for transfusion, infections, cardiac arrhythmias, as well as other organ system dysfunction including respiratory (eg: prolonged ventilation), renal, or GI complications.   The pros and cons of a biological vs mechanical valve were discussed.  After joint discussion between the patient and myself, we have  decided on implantation of biologic valves.  Patient is in agreement to proceed with surgery.  Plan: Proceed with biologic AVR/MVR and LAAL on 11/17.  Needs PFTs prior to surgery.  Con Clunes, MD Cardiothoracic Surgery Pager: [phone]    Con GORMAN Clunes, MD 2:48 PM 08/01/24

## 2024-08-05 ENCOUNTER — Other Ambulatory Visit (HOSPITAL_BASED_OUTPATIENT_CLINIC_OR_DEPARTMENT_OTHER): Payer: Self-pay

## 2024-08-09 ENCOUNTER — Other Ambulatory Visit (HOSPITAL_BASED_OUTPATIENT_CLINIC_OR_DEPARTMENT_OTHER): Payer: Self-pay

## 2024-08-09 MED ORDER — FLUZONE HIGH-DOSE 0.5 ML IM SUSY
0.5000 mL | PREFILLED_SYRINGE | Freq: Once | INTRAMUSCULAR | 0 refills | Status: AC
  Filled 2024-08-09: qty 0.5, 1d supply, fill #0

## 2024-08-15 ENCOUNTER — Encounter

## 2024-08-21 NOTE — Pre-Procedure Instructions (Signed)
 Surgical Instructions   Your procedure is scheduled on Monday, November 17th. Report to [facility] Main Entrance A at 05:30 A.M., then check in with the Admitting office. Any questions or running late day of surgery: call [phone]  Questions prior to your surgery date: call [phone], Monday-Friday, 8am-4pm. If you experience any cold or flu symptoms such as cough, fever, chills, shortness of breath, etc. between now and your scheduled surgery, please notify us  at the above number.     Remember:  Do not eat or drink after midnight the night before your surgery    Take these medicines the morning of surgery with A SIP OF WATER  May take these medicines IF NEEDED: albuterol  (VENTOLIN  HFA)- bring inhaler with you on day of surgery fluticasone  (FLONASE )   One week prior to surgery, STOP taking any Aleve, Naproxen, Ibuprofen , Motrin , Advil , Goody's, BC's, all herbal medications, fish oil, and non-prescription vitamins.  Continue taking Aspirin , but do not take on the morning of surgery.                     Do NOT Smoke (Tobacco/Vaping) for 24 hours prior to your procedure.  If you use a CPAP at night, you may bring your mask/headgear for your overnight stay.   You will be asked to remove any contacts, glasses, piercing's, hearing aid's, dentures/partials prior to surgery. Please bring cases for these items if needed.    Patients discharged the day of surgery will not be allowed to drive home, and someone needs to stay with them for 24 hours.  SURGICAL WAITING ROOM VISITATION Patients may have no more than 2 support people in the waiting area - these visitors may rotate.   Pre-op nurse will coordinate an appropriate time for 1 ADULT support person, who may not rotate, to accompany patient in pre-op.  Children under the age of 51 must have an adult with them who is not the patient and must remain in the main waiting area with an adult.  If the patient needs to stay at the  hospital during part of their recovery, the visitor guidelines for inpatient rooms apply.  Please refer to the [facility] website for the visitor guidelines for any additional information.   If you received a COVID test during your pre-op visit  it is requested that you wear a mask when out in public, stay away from anyone that may not be feeling well and notify your surgeon if you develop symptoms. If you have been in contact with anyone that has tested positive in the last 10 days please notify you surgeon.      Pre-operative CHG Bathing Instructions   You can play a key role in reducing the risk of infection after surgery. Your skin needs to be as free of germs as possible. You can reduce the number of germs on your skin by washing with CHG (chlorhexidine  gluconate) soap before surgery. CHG is an antiseptic soap that kills germs and continues to kill germs even after washing.   DO NOT use if you have an allergy to chlorhexidine /CHG or antibacterial soaps. If your skin becomes reddened or irritated, stop using the CHG and notify one of our RNs at [phone].              TAKE A SHOWER THE NIGHT BEFORE SURGERY   Please keep in mind the following:  DO NOT shave, including legs and underarms, 48 hours prior to surgery.   You may shave  your face before/day of surgery.  Place clean sheets on your bed the night before surgery Use a clean washcloth (not used since being washed) for shower. DO NOT sleep with pet's night before surgery.  CHG Shower Instructions:  Wash your face and private area with normal soap. If you choose to wash your hair, wash first with your normal shampoo.  After you use shampoo/soap, rinse your hair and body thoroughly to remove shampoo/soap residue.  Turn the water OFF and apply half the bottle of CHG soap to a CLEAN washcloth.  Apply CHG soap ONLY FROM YOUR NECK DOWN TO YOUR TOES (washing for 3-5 minutes)  DO NOT use CHG soap on face, private areas, open wounds,  or sores.  Pay special attention to the area where your surgery is being performed.  If you are having back surgery, having someone wash your back for you may be helpful. Wait 2 minutes after CHG soap is applied, then you may rinse off the CHG soap.  Pat dry with a clean towel  Put on clean pajamas    Additional instructions for the day of surgery: If you choose, you may shower the morning of surgery with an antibacterial soap.  DO NOT APPLY any lotions, deodorants, cologne, or perfumes.   Do not wear jewelry or makeup Do not wear nail polish, gel polish, artificial nails, or any other type of covering on natural nails (fingers and toes) Do not bring valuables to the hospital. [facility] is not responsible for valuables/personal belongings. Put on clean/comfortable clothes.  Please brush your teeth.  Ask your nurse before applying any prescription medications to the skin.

## 2024-08-22 ENCOUNTER — Other Ambulatory Visit (HOSPITAL_COMMUNITY)

## 2024-08-22 ENCOUNTER — Ambulatory Visit (HOSPITAL_COMMUNITY)
Admission: RE | Admit: 2024-08-22 | Discharge: 2024-08-22 | Disposition: A | Discharge status: Home / Self Care | Source: Ambulatory Visit

## 2024-08-22 ENCOUNTER — Encounter (HOSPITAL_COMMUNITY): Payer: Self-pay

## 2024-08-22 ENCOUNTER — Other Ambulatory Visit: Payer: Self-pay

## 2024-08-22 ENCOUNTER — Ambulatory Visit

## 2024-08-22 VITALS — BP 145/76 | HR 78 | Resp 20 | Wt 217.0 lb

## 2024-08-22 VITALS — BP 137/72 | Temp 98.2°F | Ht 61.0 in | Wt 215.4 lb

## 2024-08-22 DIAGNOSIS — F17200 Nicotine dependence, unspecified, uncomplicated: Secondary | ICD-10-CM | POA: Insufficient documentation

## 2024-08-22 DIAGNOSIS — R9431 Abnormal electrocardiogram [ECG] [EKG]: Secondary | ICD-10-CM | POA: Diagnosis not present

## 2024-08-22 DIAGNOSIS — I080 Rheumatic disorders of both mitral and aortic valves: Secondary | ICD-10-CM | POA: Diagnosis not present

## 2024-08-22 DIAGNOSIS — J988 Other specified respiratory disorders: Secondary | ICD-10-CM | POA: Insufficient documentation

## 2024-08-22 DIAGNOSIS — I050 Rheumatic mitral stenosis: Secondary | ICD-10-CM | POA: Diagnosis not present

## 2024-08-22 DIAGNOSIS — Z01818 Encounter for other preprocedural examination: Secondary | ICD-10-CM | POA: Insufficient documentation

## 2024-08-22 DIAGNOSIS — I351 Nonrheumatic aortic (valve) insufficiency: Secondary | ICD-10-CM

## 2024-08-22 DIAGNOSIS — I10 Essential (primary) hypertension: Secondary | ICD-10-CM | POA: Insufficient documentation

## 2024-08-22 LAB — PULMONARY FUNCTION TEST
DL/VA % pred: 105 %
DL/VA: 4.51 ml/min/mmHg/L
DLCO unc % pred: 76 %
DLCO unc: 13.65 ml/min/mmHg
FEF 25-75 Post: 1.91 L/s
FEF 25-75 Pre: 1.25 L/s
FEF2575-%Change-Post: 53 %
FEF2575-%Pred-Post: 97 %
FEF2575-%Pred-Pre: 63 %
FEV1-%Change-Post: 11 %
FEV1-%Pred-Post: 65 %
FEV1-%Pred-Pre: 58 %
FEV1-Post: 1.41 L
FEV1-Pre: 1.26 L
FEV1FVC-%Change-Post: 2 %
FEV1FVC-%Pred-Pre: 105 %
FEV6-%Change-Post: 9 %
FEV6-%Pred-Post: 63 %
FEV6-%Pred-Pre: 57 %
FEV6-Post: 1.7 L
FEV6-Pre: 1.55 L
FEV6FVC-%Pred-Post: 104 %
FEV6FVC-%Pred-Pre: 104 %
FVC-%Change-Post: 9 %
FVC-%Pred-Post: 60 %
FVC-%Pred-Pre: 55 %
FVC-Post: 1.7 L
FVC-Pre: 1.55 L
Post FEV1/FVC ratio: 83 %
Post FEV6/FVC ratio: 100 %
Pre FEV1/FVC ratio: 81 %
Pre FEV6/FVC Ratio: 100 %
RV % pred: 156 %
RV: 3.04 L
TLC % pred: 103 %
TLC: 4.76 L

## 2024-08-22 LAB — URINALYSIS, ROUTINE W REFLEX MICROSCOPIC
Bilirubin Urine: NEGATIVE
Glucose, UA: NEGATIVE mg/dL
Ketones, ur: NEGATIVE mg/dL
Leukocytes,Ua: NEGATIVE
Nitrite: NEGATIVE
Protein, ur: NEGATIVE mg/dL
Specific Gravity, Urine: 1.018 (ref 1.005–1.030)
pH: 6 (ref 5.0–8.0)

## 2024-08-22 LAB — COMPREHENSIVE METABOLIC PANEL WITH GFR
ALT: 16 U/L (ref 0–44)
AST: 17 U/L (ref 15–41)
Albumin: 3.4 g/dL — ABNORMAL LOW (ref 3.5–5.0)
Alkaline Phosphatase: 71 U/L (ref 38–126)
Anion gap: 11 (ref 5–15)
BUN: 15 mg/dL (ref 8–23)
CO2: 27 mmol/L (ref 22–32)
Calcium: 9.3 mg/dL (ref 8.9–10.3)
Chloride: 104 mmol/L (ref 98–111)
Creatinine, Ser: 0.88 mg/dL (ref 0.44–1.00)
GFR, Estimated: >60 mL/min (ref >60)
Glucose, Bld: 98 mg/dL (ref 70–99)
Potassium: 3.9 mmol/L (ref 3.5–5.1)
Sodium: 142 mmol/L (ref 135–145)
Total Bilirubin: 0.7 mg/dL (ref 0.0–1.2)
Total Protein: 7.6 g/dL (ref 6.5–8.1)

## 2024-08-22 LAB — CBC
HCT: 42.7 % (ref 36.0–46.0)
Hemoglobin: 14.1 g/dL (ref 12.0–15.0)
MCH: 32.2 pg (ref 26.0–34.0)
MCHC: 33 g/dL (ref 30.0–36.0)
MCV: 97.5 fL (ref 80.0–100.0)
Platelets: 238 K/uL (ref 150–400)
RBC: 4.38 MIL/uL (ref 3.87–5.11)
RDW: 16 % — ABNORMAL HIGH (ref 11.5–15.5)
WBC: 6.7 K/uL (ref 4.0–10.5)
nRBC: 0 % (ref 0.0–0.2)

## 2024-08-22 LAB — APTT: aPTT: 28 s (ref 24–36)

## 2024-08-22 LAB — SURGICAL PCR SCREEN
MRSA, PCR: NEGATIVE
Staphylococcus aureus: NEGATIVE

## 2024-08-22 LAB — PROTIME-INR
INR: 1 (ref 0.8–1.2)
Prothrombin Time: 13.3 s (ref 11.4–15.2)

## 2024-08-22 LAB — HEMOGLOBIN A1C
Hgb A1c MFr Bld: 5.5 % (ref 4.8–5.6)
Mean Plasma Glucose: 111.15 mg/dL

## 2024-08-22 MED ORDER — ALBUTEROL SULFATE (2.5 MG/3ML) 0.083% IN NEBU
2.5000 mg | INHALATION_SOLUTION | Freq: Once | RESPIRATORY_TRACT | Status: AC
  Administered 2024-08-22: 2.5 mg via RESPIRATORY_TRACT

## 2024-08-22 NOTE — Progress Notes (Addendum)
 PCP - Eleanor Ponto, NP Cardiologist - Dr. Alm Clay - last office visit 07/01/2024  PPM/ICD - Denies Device Orders - n/a Rep Notified - n/a  Chest x-ray - 08/22/2024 EKG - 08/22/2024 Stress Test - 07/01/1996 ECHO - 06/21/2024 Cardiac Cath - 07/16/2024  Sleep Study - Denies CPAP - n/a  No DM  Last dose of GLP1 agonist- n/a GLP1 instructions: n/a  Blood Thinner Instructions: n/a Aspirin  Instructions: Pt instructed to continue taking ASA through the day before surgery and none the morning of surgery  NPO after midnight  COVID TEST- n/a   Anesthesia review: Yes. EKG review  Patient denies shortness of breath, fever, cough and chest pain at PAT appointment. Pt denies any respiratory illness/infection in the last two months.   All instructions explained to the patient, with a verbal understanding of the material. Patient agrees to go over the instructions while at home for a better understanding. Patient also instructed to self quarantine after being tested for COVID-19. The opportunity to ask questions was provided.

## 2024-08-23 ENCOUNTER — Other Ambulatory Visit: Payer: Self-pay

## 2024-08-23 MED ORDER — TRANEXAMIC ACID (OHS) PUMP PRIME SOLUTION
2.0000 mg/kg | INTRAVENOUS | Status: DC
  Filled 2024-08-23: qty 1.97

## 2024-08-23 MED ORDER — POTASSIUM CHLORIDE 2 MEQ/ML IV SOLN
80.0000 meq | INTRAVENOUS | Status: DC
  Filled 2024-08-23: qty 40

## 2024-08-23 MED ORDER — MANNITOL 20 % IV SOLN
INTRAVENOUS | Status: DC
  Filled 2024-08-23: qty 13

## 2024-08-23 MED ORDER — CEFAZOLIN SODIUM-DEXTROSE 2-4 GM/100ML-% IV SOLN
2.0000 g | INTRAVENOUS | Status: DC
  Filled 2024-08-23: qty 100

## 2024-08-23 MED ORDER — EPINEPHRINE HCL 5 MG/250ML IV SOLN IN NS
0.0000 ug/min | INTRAVENOUS | Status: AC
  Administered 2024-08-26: 2 ug/min via INTRAVENOUS
  Filled 2024-08-23: qty 250

## 2024-08-23 MED ORDER — PLASMA-LYTE A IV SOLN
INTRAVENOUS | Status: DC
  Filled 2024-08-23: qty 2.5

## 2024-08-23 MED ORDER — VANCOMYCIN HCL 1.5 G IV SOLR
1500.0000 mg | INTRAVENOUS | Status: AC
  Administered 2024-08-26: 1500 mg via INTRAVENOUS
  Filled 2024-08-23: qty 30

## 2024-08-23 MED ORDER — NOREPINEPHRINE 4 MG/250ML-% IV SOLN
0.0000 ug/min | INTRAVENOUS | Status: AC
  Administered 2024-08-26: 4 ug/min via INTRAVENOUS
  Filled 2024-08-23: qty 250

## 2024-08-23 MED ORDER — MILRINONE LACTATE IN DEXTROSE 20-5 MG/100ML-% IV SOLN
0.3000 ug/kg/min | INTRAVENOUS | Status: DC
  Filled 2024-08-23: qty 100

## 2024-08-23 MED ORDER — TRANEXAMIC ACID 1000 MG/10ML IV SOLN
1.5000 mg/kg/h | INTRAVENOUS | Status: AC
  Administered 2024-08-26: 1.5 mg/kg/h via INTRAVENOUS
  Filled 2024-08-23: qty 25

## 2024-08-23 MED ORDER — TRANEXAMIC ACID (OHS) BOLUS VIA INFUSION
15.0000 mg/kg | INTRAVENOUS | Status: AC
  Administered 2024-08-26: 1476 mg via INTRAVENOUS
  Filled 2024-08-23: qty 1476

## 2024-08-23 MED ORDER — DEXMEDETOMIDINE HCL IN NACL 400 MCG/100ML IV SOLN
0.1000 ug/kg/h | INTRAVENOUS | Status: AC
  Administered 2024-08-26: .4 ug/kg/h via INTRAVENOUS
  Filled 2024-08-23: qty 100

## 2024-08-23 MED ORDER — LORAZEPAM 0.5 MG PO TABS: 0.5000 mg | ORAL_TABLET | Freq: Two times a day (BID) | ORAL | 0 refills | Status: AC

## 2024-08-23 MED ORDER — NITROGLYCERIN IN D5W 200-5 MCG/ML-% IV SOLN
2.0000 ug/min | INTRAVENOUS | Status: DC
  Filled 2024-08-23: qty 250

## 2024-08-23 MED ORDER — INSULIN REGULAR(HUMAN) IN NACL 100-0.9 UT/100ML-% IV SOLN
INTRAVENOUS | Status: AC
  Administered 2024-08-26: .5 [IU]/h via INTRAVENOUS
  Filled 2024-08-23: qty 100

## 2024-08-23 MED ORDER — CEFAZOLIN SODIUM-DEXTROSE 2-4 GM/100ML-% IV SOLN
2.0000 g | INTRAVENOUS | Status: AC
  Administered 2024-08-26 (×2): 2 g via INTRAVENOUS
  Filled 2024-08-23: qty 100

## 2024-08-23 MED ORDER — PHENYLEPHRINE HCL-NACL 20-0.9 MG/250ML-% IV SOLN
30.0000 ug/min | INTRAVENOUS | Status: AC
  Administered 2024-08-26: 30 ug/min via INTRAVENOUS
  Filled 2024-08-23: qty 250

## 2024-08-23 MED ORDER — HEPARIN 30,000 UNITS/1000 ML (OHS) CELLSAVER SOLUTION
Status: DC
  Filled 2024-08-23: qty 1000

## 2024-08-23 NOTE — Progress Notes (Signed)
-  Sent in lorazepam  0.5 mg.  She is to take one pill at bed time the night prior to surgery and one pill in the morning the day of surgery for anxiety.

## 2024-08-23 NOTE — Progress Notes (Signed)
      [address] [place] [address]             [phone]       HPI:  Katie Jacobson is a 68 year old woman with severe MR, mild to mod MS, and severe AI secondary to rheumatic valvular disease, who presents for further discussion prior to surgery.     She mainly just wanted to meet one more time to ask a couple more questions as she is very anxious about surgery.   She is still smoking but otherwise feels well.  She also completed the remainder of her pre-op workup today, including PFTs which demonstrated FEV1 58% and DLCO 76%.  No significant carotid disease.  Allergies as of 08/22/2024       Reactions   Sulfa Antibiotics Other (See Comments)   UNKNOWN        Medication List        Accurate as of August 22, 2024 11:59 PM. If you have any questions, ask your nurse or doctor.          albuterol  108 (90 Base) MCG/ACT inhaler Commonly known as: VENTOLIN  HFA Inhale 1-2 puffs into the lungs every 6 (six) hours as needed for wheezing or shortness of breath.   aspirin  81 MG tablet Take 1 tablet (81 mg total) by mouth daily.   chlorthalidone  25 MG tablet Commonly known as: HYGROTON  TAKE 1 TABLET(25 MG) BY MOUTH EVERY OTHER DAY What changed:  how much to take how to take this when to take this additional instructions   enalapril  20 MG tablet Commonly known as: VASOTEC  Take 1 tablet (20 mg total) by mouth daily. What changed: when to take this   fluticasone  50 MCG/ACT nasal spray Commonly known as: FLONASE  Place 1 spray into both nostrils as needed for allergies or rhinitis.   metoprolol  succinate 100 MG 24 hr tablet Commonly known as: TOPROL -XL Take 1 tablet (100 mg total) by mouth daily. TAKE WITH OR IMMEDIATELY FOLLOWING A MEAL. What changed: when to take this        BP (!) 145/76   Pulse 78   Resp 20   Wt 217 lb (98.4 kg)   LMP 10/10/1996   SpO2 95% Comment: RA  BMI 41.00 kg/m   Physical Exam: General - Sitting  comfortably in chair, no distress but appears anxious CV - RRR; right mastectomy incision with dressing in place Resp - Unlabored on room air Abd - Soft, ND/NT Ext - no leg edema  Imaging: No new imaging   Assessment/Plan: Katie Jacobson is a 68 y.o. female who is scheduled for AVR/MVR and LAAL on 11/17.  She presents for pre-op discussion and reassurance.  Her pre-op workup is complete and we will continue to move forward with surgery on Monday.  All of her questions/concerns were addressed.  Risks/benefits of surgery were discussed as outlined in her last clinic visit note.  Con GORMAN Clunes, MD 12:18 AM 08/23/24

## 2024-08-25 NOTE — Anesthesia Preprocedure Evaluation (Signed)
 Anesthesia Evaluation  Patient identified by MRN, date of birth, ID band Patient awake    Reviewed: Allergy & Precautions, H&P , NPO status , Patient's Chart, lab work & pertinent test results  History of Anesthesia Complications Negative for: history of anesthetic complications  Airway Mallampati: II  TM Distance: >3 FB Neck ROM: Full    Dental no notable dental hx. (+) Poor Dentition   Pulmonary asthma , Current Smoker   Pulmonary exam normal breath sounds clear to auscultation       Cardiovascular hypertension, Normal cardiovascular exam+ Valvular Problems/Murmurs MR, AI and AS  Rhythm:Regular Rate:Normal  Normal biventricular function Severe MR, moderate MS Severe AI, moderate AS   Neuro/Psych neg Seizures CVA  negative psych ROS   GI/Hepatic negative GI ROS, Neg liver ROS,,,  Endo/Other  negative endocrine ROS    Renal/GU negative Renal ROS  negative genitourinary   Musculoskeletal  (+) Arthritis ,    Abdominal   Peds negative pediatric ROS (+)  Hematology negative hematology ROS (+)   Anesthesia Other Findings   Reproductive/Obstetrics Right sided breast cancer s/p chemotherapy                              Anesthesia Physical Anesthesia Plan  ASA: 4  Anesthesia Plan: General   Post-op Pain Management:    Induction: Intravenous  PONV Risk Score and Plan: 2 and Treatment may vary due to age or medical condition  Airway Management Planned: Oral ETT  Additional Equipment: Arterial line, 3D TEE, TEE, PA Cath and None  Intra-op Plan:   Post-operative Plan: Extubation in OR  Informed Consent: I have reviewed the patients History and Physical, chart, labs and discussed the procedure including the risks, benefits and alternatives for the proposed anesthesia with the patient or authorized representative who has indicated his/her understanding and acceptance.     Dental  advisory given  Plan Discussed with: CRNA  Anesthesia Plan Comments: ([facility] Swan Right sided A-line, left large PIV Right MAC  Vasopressin infusion  Epi infusion  Bolus epi and vaso   )         Anesthesia Quick Evaluation

## 2024-08-26 ENCOUNTER — Inpatient Hospital Stay (HOSPITAL_COMMUNITY)

## 2024-08-26 ENCOUNTER — Inpatient Hospital Stay (HOSPITAL_COMMUNITY): Admission: RE | Admit: 2024-08-26 | Discharge: 2024-09-12 | DRG: 219 | Disposition: A

## 2024-08-26 ENCOUNTER — Inpatient Hospital Stay (HOSPITAL_COMMUNITY): Payer: Self-pay | Admitting: Certified Registered Nurse Anesthetist

## 2024-08-26 ENCOUNTER — Encounter (HOSPITAL_COMMUNITY): Payer: Self-pay

## 2024-08-26 ENCOUNTER — Inpatient Hospital Stay (HOSPITAL_COMMUNITY): Payer: Self-pay | Admitting: Physician Assistant

## 2024-08-26 ENCOUNTER — Encounter (HOSPITAL_COMMUNITY): Admission: RE | Disposition: A | Payer: Self-pay | Source: Home / Self Care

## 2024-08-26 DIAGNOSIS — I350 Nonrheumatic aortic (valve) stenosis: Secondary | ICD-10-CM | POA: Diagnosis not present

## 2024-08-26 DIAGNOSIS — Z6841 Body Mass Index (BMI) 40.0 and over, adult: Secondary | ICD-10-CM

## 2024-08-26 DIAGNOSIS — J9601 Acute respiratory failure with hypoxia: Secondary | ICD-10-CM | POA: Diagnosis not present

## 2024-08-26 DIAGNOSIS — E66813 Obesity, class 3: Secondary | ICD-10-CM | POA: Diagnosis present

## 2024-08-26 DIAGNOSIS — I352 Nonrheumatic aortic (valve) stenosis with insufficiency: Secondary | ICD-10-CM | POA: Diagnosis not present

## 2024-08-26 DIAGNOSIS — Z9889 Other specified postprocedural states: Secondary | ICD-10-CM | POA: Diagnosis not present

## 2024-08-26 DIAGNOSIS — R0989 Other specified symptoms and signs involving the circulatory and respiratory systems: Secondary | ICD-10-CM | POA: Diagnosis not present

## 2024-08-26 DIAGNOSIS — I050 Rheumatic mitral stenosis: Secondary | ICD-10-CM

## 2024-08-26 DIAGNOSIS — I259 Chronic ischemic heart disease, unspecified: Secondary | ICD-10-CM | POA: Diagnosis present

## 2024-08-26 DIAGNOSIS — Z452 Encounter for adjustment and management of vascular access device: Secondary | ICD-10-CM | POA: Diagnosis not present

## 2024-08-26 DIAGNOSIS — T8111XA Postprocedural  cardiogenic shock, initial encounter: Secondary | ICD-10-CM | POA: Diagnosis present

## 2024-08-26 DIAGNOSIS — R570 Cardiogenic shock: Secondary | ICD-10-CM | POA: Diagnosis not present

## 2024-08-26 DIAGNOSIS — I340 Nonrheumatic mitral (valve) insufficiency: Secondary | ICD-10-CM | POA: Diagnosis not present

## 2024-08-26 DIAGNOSIS — Z7901 Long term (current) use of anticoagulants: Secondary | ICD-10-CM

## 2024-08-26 DIAGNOSIS — I519 Heart disease, unspecified: Secondary | ICD-10-CM

## 2024-08-26 DIAGNOSIS — I452 Bifascicular block: Secondary | ICD-10-CM | POA: Diagnosis not present

## 2024-08-26 DIAGNOSIS — J810 Acute pulmonary edema: Secondary | ICD-10-CM | POA: Diagnosis not present

## 2024-08-26 DIAGNOSIS — I495 Sick sinus syndrome: Secondary | ICD-10-CM | POA: Diagnosis present

## 2024-08-26 DIAGNOSIS — F419 Anxiety disorder, unspecified: Secondary | ICD-10-CM | POA: Diagnosis not present

## 2024-08-26 DIAGNOSIS — Z978 Presence of other specified devices: Secondary | ICD-10-CM

## 2024-08-26 DIAGNOSIS — Z90710 Acquired absence of both cervix and uterus: Secondary | ICD-10-CM

## 2024-08-26 DIAGNOSIS — I97190 Other postprocedural cardiac functional disturbances following cardiac surgery: Secondary | ICD-10-CM | POA: Diagnosis not present

## 2024-08-26 DIAGNOSIS — Y838 Other surgical procedures as the cause of abnormal reaction of the patient, or of later complication, without mention of misadventure at the time of the procedure: Secondary | ICD-10-CM | POA: Diagnosis not present

## 2024-08-26 DIAGNOSIS — E8770 Fluid overload, unspecified: Secondary | ICD-10-CM

## 2024-08-26 DIAGNOSIS — E876 Hypokalemia: Secondary | ICD-10-CM | POA: Diagnosis not present

## 2024-08-26 DIAGNOSIS — Z833 Family history of diabetes mellitus: Secondary | ICD-10-CM

## 2024-08-26 DIAGNOSIS — I351 Nonrheumatic aortic (valve) insufficiency: Secondary | ICD-10-CM

## 2024-08-26 DIAGNOSIS — E785 Hyperlipidemia, unspecified: Secondary | ICD-10-CM | POA: Diagnosis present

## 2024-08-26 DIAGNOSIS — K5900 Constipation, unspecified: Secondary | ICD-10-CM | POA: Diagnosis not present

## 2024-08-26 DIAGNOSIS — I5021 Acute systolic (congestive) heart failure: Secondary | ICD-10-CM | POA: Diagnosis not present

## 2024-08-26 DIAGNOSIS — Z823 Family history of stroke: Secondary | ICD-10-CM

## 2024-08-26 DIAGNOSIS — J811 Chronic pulmonary edema: Secondary | ICD-10-CM | POA: Diagnosis not present

## 2024-08-26 DIAGNOSIS — I447 Left bundle-branch block, unspecified: Secondary | ICD-10-CM | POA: Diagnosis not present

## 2024-08-26 DIAGNOSIS — E871 Hypo-osmolality and hyponatremia: Secondary | ICD-10-CM | POA: Diagnosis not present

## 2024-08-26 DIAGNOSIS — Z952 Presence of prosthetic heart valve: Principal | ICD-10-CM

## 2024-08-26 DIAGNOSIS — I080 Rheumatic disorders of both mitral and aortic valves: Secondary | ICD-10-CM

## 2024-08-26 DIAGNOSIS — R1110 Vomiting, unspecified: Secondary | ICD-10-CM | POA: Diagnosis not present

## 2024-08-26 DIAGNOSIS — Z8419 Family history of other disorders of kidney and ureter: Secondary | ICD-10-CM

## 2024-08-26 DIAGNOSIS — R112 Nausea with vomiting, unspecified: Secondary | ICD-10-CM

## 2024-08-26 DIAGNOSIS — R269 Unspecified abnormalities of gait and mobility: Secondary | ICD-10-CM | POA: Diagnosis not present

## 2024-08-26 DIAGNOSIS — Z923 Personal history of irradiation: Secondary | ICD-10-CM

## 2024-08-26 DIAGNOSIS — F17210 Nicotine dependence, cigarettes, uncomplicated: Secondary | ICD-10-CM | POA: Diagnosis not present

## 2024-08-26 DIAGNOSIS — I442 Atrioventricular block, complete: Secondary | ICD-10-CM | POA: Diagnosis not present

## 2024-08-26 DIAGNOSIS — Z7982 Long term (current) use of aspirin: Secondary | ICD-10-CM

## 2024-08-26 DIAGNOSIS — Z4682 Encounter for fitting and adjustment of non-vascular catheter: Secondary | ICD-10-CM | POA: Diagnosis not present

## 2024-08-26 DIAGNOSIS — I088 Other rheumatic multiple valve diseases: Secondary | ICD-10-CM | POA: Diagnosis not present

## 2024-08-26 DIAGNOSIS — Z9221 Personal history of antineoplastic chemotherapy: Secondary | ICD-10-CM

## 2024-08-26 DIAGNOSIS — N179 Acute kidney failure, unspecified: Secondary | ICD-10-CM | POA: Diagnosis not present

## 2024-08-26 DIAGNOSIS — D649 Anemia, unspecified: Secondary | ICD-10-CM | POA: Diagnosis not present

## 2024-08-26 DIAGNOSIS — I110 Hypertensive heart disease with heart failure: Secondary | ICD-10-CM | POA: Diagnosis not present

## 2024-08-26 DIAGNOSIS — J45909 Unspecified asthma, uncomplicated: Secondary | ICD-10-CM | POA: Diagnosis not present

## 2024-08-26 DIAGNOSIS — Z8249 Family history of ischemic heart disease and other diseases of the circulatory system: Secondary | ICD-10-CM

## 2024-08-26 DIAGNOSIS — Z87442 Personal history of urinary calculi: Secondary | ICD-10-CM

## 2024-08-26 DIAGNOSIS — E1165 Type 2 diabetes mellitus with hyperglycemia: Secondary | ICD-10-CM | POA: Diagnosis present

## 2024-08-26 DIAGNOSIS — I5082 Biventricular heart failure: Secondary | ICD-10-CM | POA: Diagnosis not present

## 2024-08-26 DIAGNOSIS — K567 Ileus, unspecified: Secondary | ICD-10-CM | POA: Diagnosis not present

## 2024-08-26 DIAGNOSIS — J90 Pleural effusion, not elsewhere classified: Secondary | ICD-10-CM | POA: Diagnosis not present

## 2024-08-26 DIAGNOSIS — M810 Age-related osteoporosis without current pathological fracture: Secondary | ICD-10-CM | POA: Diagnosis present

## 2024-08-26 DIAGNOSIS — I358 Other nonrheumatic aortic valve disorders: Secondary | ICD-10-CM | POA: Diagnosis not present

## 2024-08-26 DIAGNOSIS — I517 Cardiomegaly: Secondary | ICD-10-CM | POA: Diagnosis not present

## 2024-08-26 DIAGNOSIS — Z83719 Family history of colon polyps, unspecified: Secondary | ICD-10-CM

## 2024-08-26 DIAGNOSIS — J9600 Acute respiratory failure, unspecified whether with hypoxia or hypercapnia: Secondary | ICD-10-CM | POA: Diagnosis not present

## 2024-08-26 DIAGNOSIS — I4892 Unspecified atrial flutter: Secondary | ICD-10-CM | POA: Diagnosis not present

## 2024-08-26 DIAGNOSIS — J9811 Atelectasis: Secondary | ICD-10-CM | POA: Diagnosis not present

## 2024-08-26 DIAGNOSIS — R140 Abdominal distension (gaseous): Secondary | ICD-10-CM | POA: Diagnosis not present

## 2024-08-26 DIAGNOSIS — Z48812 Encounter for surgical aftercare following surgery on the circulatory system: Secondary | ICD-10-CM | POA: Diagnosis not present

## 2024-08-26 DIAGNOSIS — Z79899 Other long term (current) drug therapy: Secondary | ICD-10-CM

## 2024-08-26 DIAGNOSIS — Z803 Family history of malignant neoplasm of breast: Secondary | ICD-10-CM

## 2024-08-26 DIAGNOSIS — Z9011 Acquired absence of right breast and nipple: Secondary | ICD-10-CM

## 2024-08-26 DIAGNOSIS — I10 Essential (primary) hypertension: Secondary | ICD-10-CM | POA: Diagnosis not present

## 2024-08-26 DIAGNOSIS — I083 Combined rheumatic disorders of mitral, aortic and tricuspid valves: Secondary | ICD-10-CM | POA: Diagnosis not present

## 2024-08-26 DIAGNOSIS — M549 Dorsalgia, unspecified: Secondary | ICD-10-CM | POA: Diagnosis not present

## 2024-08-26 DIAGNOSIS — R918 Other nonspecific abnormal finding of lung field: Secondary | ICD-10-CM | POA: Diagnosis not present

## 2024-08-26 DIAGNOSIS — Z853 Personal history of malignant neoplasm of breast: Secondary | ICD-10-CM

## 2024-08-26 HISTORY — PX: MITRAL VALVE REPLACEMENT: SHX147

## 2024-08-26 HISTORY — PX: CLIPPING OF ATRIAL APPENDAGE: SHX5773

## 2024-08-26 HISTORY — PX: AORTIC VALVE REPLACEMENT: SHX41

## 2024-08-26 HISTORY — PX: INTRAOPERATIVE TRANSESOPHAGEAL ECHOCARDIOGRAM: SHX5062

## 2024-08-26 LAB — POCT I-STAT, CHEM 8
BUN: 16 mg/dL (ref 8–23)
BUN: 14 mg/dL (ref 8–23)
BUN: 14 mg/dL (ref 8–23)
BUN: 15 mg/dL (ref 8–23)
BUN: 15 mg/dL (ref 8–23)
BUN: 16 mg/dL (ref 8–23)
BUN: 15 mg/dL (ref 8–23)
BUN: 14 mg/dL (ref 8–23)
Calcium, Ion: 1.21 mmol/L (ref 1.15–1.40)
Calcium, Ion: 1.17 mmol/L (ref 1.15–1.40)
Calcium, Ion: 1.06 mmol/L — ABNORMAL LOW (ref 1.15–1.40)
Calcium, Ion: 0.99 mmol/L — ABNORMAL LOW (ref 1.15–1.40)
Calcium, Ion: 0.96 mmol/L — ABNORMAL LOW (ref 1.15–1.40)
Calcium, Ion: 0.99 mmol/L — ABNORMAL LOW (ref 1.15–1.40)
Calcium, Ion: 1.17 mmol/L (ref 1.15–1.40)
Calcium, Ion: 1.1 mmol/L — ABNORMAL LOW (ref 1.15–1.40)
Chloride: 103 mmol/L (ref 98–111)
Chloride: 105 mmol/L (ref 98–111)
Chloride: 101 mmol/L (ref 98–111)
Chloride: 99 mmol/L (ref 98–111)
Chloride: 98 mmol/L (ref 98–111)
Chloride: 101 mmol/L (ref 98–111)
Chloride: 103 mmol/L (ref 98–111)
Chloride: 104 mmol/L (ref 98–111)
Creatinine, Ser: 0.8 mg/dL (ref 0.44–1.00)
Creatinine, Ser: 0.8 mg/dL (ref 0.44–1.00)
Creatinine, Ser: 0.6 mg/dL (ref 0.44–1.00)
Creatinine, Ser: 0.8 mg/dL (ref 0.44–1.00)
Creatinine, Ser: 0.8 mg/dL (ref 0.44–1.00)
Creatinine, Ser: 0.8 mg/dL (ref 0.44–1.00)
Creatinine, Ser: 0.7 mg/dL (ref 0.44–1.00)
Creatinine, Ser: 0.9 mg/dL (ref 0.44–1.00)
Glucose, Bld: 91 mg/dL (ref 70–99)
Glucose, Bld: 104 mg/dL — ABNORMAL HIGH (ref 70–99)
Glucose, Bld: 93 mg/dL (ref 70–99)
Glucose, Bld: 125 mg/dL — ABNORMAL HIGH (ref 70–99)
Glucose, Bld: 125 mg/dL — ABNORMAL HIGH (ref 70–99)
Glucose, Bld: 127 mg/dL — ABNORMAL HIGH (ref 70–99)
Glucose, Bld: 130 mg/dL — ABNORMAL HIGH (ref 70–99)
Glucose, Bld: 175 mg/dL — ABNORMAL HIGH (ref 70–99)
HCT: 38 % (ref 36.0–46.0)
HCT: 32 % — ABNORMAL LOW (ref 36.0–46.0)
HCT: 26 % — ABNORMAL LOW (ref 36.0–46.0)
HCT: 26 % — ABNORMAL LOW (ref 36.0–46.0)
HCT: 24 % — ABNORMAL LOW (ref 36.0–46.0)
HCT: 25 % — ABNORMAL LOW (ref 36.0–46.0)
HCT: 26 % — ABNORMAL LOW (ref 36.0–46.0)
HCT: 29 % — ABNORMAL LOW (ref 36.0–46.0)
Hemoglobin: 12.9 g/dL (ref 12.0–15.0)
Hemoglobin: 10.9 g/dL — ABNORMAL LOW (ref 12.0–15.0)
Hemoglobin: 8.8 g/dL — ABNORMAL LOW (ref 12.0–15.0)
Hemoglobin: 8.8 g/dL — ABNORMAL LOW (ref 12.0–15.0)
Hemoglobin: 8.2 g/dL — ABNORMAL LOW (ref 12.0–15.0)
Hemoglobin: 8.5 g/dL — ABNORMAL LOW (ref 12.0–15.0)
Hemoglobin: 8.8 g/dL — ABNORMAL LOW (ref 12.0–15.0)
Hemoglobin: 9.9 g/dL — ABNORMAL LOW (ref 12.0–15.0)
Potassium: 3.6 mmol/L (ref 3.5–5.1)
Potassium: 3.5 mmol/L (ref 3.5–5.1)
Potassium: 5 mmol/L (ref 3.5–5.1)
Potassium: 4.6 mmol/L (ref 3.5–5.1)
Potassium: 4 mmol/L (ref 3.5–5.1)
Potassium: 4.3 mmol/L (ref 3.5–5.1)
Potassium: 3.9 mmol/L (ref 3.5–5.1)
Potassium: 3.9 mmol/L (ref 3.5–5.1)
Sodium: 140 mmol/L (ref 135–145)
Sodium: 137 mmol/L (ref 135–145)
Sodium: 134 mmol/L — ABNORMAL LOW (ref 135–145)
Sodium: 138 mmol/L (ref 135–145)
Sodium: 136 mmol/L (ref 135–145)
Sodium: 138 mmol/L (ref 135–145)
Sodium: 139 mmol/L (ref 135–145)
Sodium: 141 mmol/L (ref 135–145)
TCO2: 25 mmol/L (ref 22–32)
TCO2: 24 mmol/L (ref 22–32)
TCO2: 25 mmol/L (ref 22–32)
TCO2: 29 mmol/L (ref 22–32)
TCO2: 27 mmol/L (ref 22–32)
TCO2: 27 mmol/L (ref 22–32)
TCO2: 24 mmol/L (ref 22–32)
TCO2: 22 mmol/L (ref 22–32)

## 2024-08-26 LAB — COOXEMETRY PANEL
Carboxyhemoglobin: 1.8 % — ABNORMAL HIGH (ref 0.5–1.5)
Methemoglobin: <0.7 % (ref 0.0–1.5)
O2 Saturation: 65.9 %
Total hemoglobin: 10.5 g/dL — ABNORMAL LOW (ref 12.0–16.0)

## 2024-08-26 LAB — CBC
HCT: 33.2 % — ABNORMAL LOW (ref 36.0–46.0)
HCT: 30.9 % — ABNORMAL LOW (ref 36.0–46.0)
Hemoglobin: 11.3 g/dL — ABNORMAL LOW (ref 12.0–15.0)
Hemoglobin: 10.5 g/dL — ABNORMAL LOW (ref 12.0–15.0)
MCH: 33.2 pg (ref 26.0–34.0)
MCH: 32.9 pg (ref 26.0–34.0)
MCHC: 34 g/dL (ref 30.0–36.0)
MCHC: 34 g/dL (ref 30.0–36.0)
MCV: 97.6 fL (ref 80.0–100.0)
MCV: 96.9 fL (ref 80.0–100.0)
Platelets: 119 K/uL — ABNORMAL LOW (ref 150–400)
Platelets: 115 K/uL — ABNORMAL LOW (ref 150–400)
RBC: 3.4 MIL/uL — ABNORMAL LOW (ref 3.87–5.11)
RBC: 3.19 MIL/uL — ABNORMAL LOW (ref 3.87–5.11)
RDW: 16 % — ABNORMAL HIGH (ref 11.5–15.5)
RDW: 16 % — ABNORMAL HIGH (ref 11.5–15.5)
WBC: 17.3 K/uL — ABNORMAL HIGH (ref 4.0–10.5)
WBC: 12.7 K/uL — ABNORMAL HIGH (ref 4.0–10.5)
nRBC: 0 % (ref 0.0–0.2)
nRBC: 0 % (ref 0.0–0.2)

## 2024-08-26 LAB — POCT I-STAT 7, (LYTES, BLD GAS, ICA,H+H)
Acid-Base Excess: 0 mmol/L (ref 0.0–2.0)
Acid-base deficit: 2 mmol/L (ref 0.0–2.0)
Acid-base deficit: 3 mmol/L — ABNORMAL HIGH (ref 0.0–2.0)
Acid-base deficit: 4 mmol/L — ABNORMAL HIGH (ref 0.0–2.0)
Acid-base deficit: 5 mmol/L — ABNORMAL HIGH (ref 0.0–2.0)
Acid-base deficit: 4 mmol/L — ABNORMAL HIGH (ref 0.0–2.0)
Bicarbonate: 24.7 mmol/L (ref 20.0–28.0)
Bicarbonate: 20.5 mmol/L (ref 20.0–28.0)
Bicarbonate: 25.4 mmol/L (ref 20.0–28.0)
Bicarbonate: 22.2 mmol/L (ref 20.0–28.0)
Bicarbonate: 19.6 mmol/L — ABNORMAL LOW (ref 20.0–28.0)
Bicarbonate: 20.3 mmol/L (ref 20.0–28.0)
Calcium, Ion: 1.22 mmol/L (ref 1.15–1.40)
Calcium, Ion: 0.96 mmol/L — ABNORMAL LOW (ref 1.15–1.40)
Calcium, Ion: 1.17 mmol/L (ref 1.15–1.40)
Calcium, Ion: 1.13 mmol/L — ABNORMAL LOW (ref 1.15–1.40)
Calcium, Ion: 1.05 mmol/L — ABNORMAL LOW (ref 1.15–1.40)
Calcium, Ion: 1.09 mmol/L — ABNORMAL LOW (ref 1.15–1.40)
HCT: 36 % (ref 36.0–46.0)
HCT: 26 % — ABNORMAL LOW (ref 36.0–46.0)
HCT: 22 % — ABNORMAL LOW (ref 36.0–46.0)
HCT: 34 % — ABNORMAL LOW (ref 36.0–46.0)
HCT: 29 % — ABNORMAL LOW (ref 36.0–46.0)
HCT: 30 % — ABNORMAL LOW (ref 36.0–46.0)
Hemoglobin: 12.2 g/dL (ref 12.0–15.0)
Hemoglobin: 8.8 g/dL — ABNORMAL LOW (ref 12.0–15.0)
Hemoglobin: 7.5 g/dL — ABNORMAL LOW (ref 12.0–15.0)
Hemoglobin: 11.6 g/dL — ABNORMAL LOW (ref 12.0–15.0)
Hemoglobin: 9.9 g/dL — ABNORMAL LOW (ref 12.0–15.0)
Hemoglobin: 10.2 g/dL — ABNORMAL LOW (ref 12.0–15.0)
O2 Saturation: 100 %
O2 Saturation: 100 %
O2 Saturation: 100 %
O2 Saturation: 97 %
O2 Saturation: 99 %
O2 Saturation: 94 %
Patient temperature: 37.7
Patient temperature: 37.1
Patient temperature: 36.2
Potassium: 3.6 mmol/L (ref 3.5–5.1)
Potassium: 4 mmol/L (ref 3.5–5.1)
Potassium: 3.9 mmol/L (ref 3.5–5.1)
Potassium: 3.4 mmol/L — ABNORMAL LOW (ref 3.5–5.1)
Potassium: 3.4 mmol/L — ABNORMAL LOW (ref 3.5–5.1)
Potassium: 3.6 mmol/L (ref 3.5–5.1)
Sodium: 139 mmol/L (ref 135–145)
Sodium: 138 mmol/L (ref 135–145)
Sodium: 139 mmol/L (ref 135–145)
Sodium: 141 mmol/L (ref 135–145)
Sodium: 143 mmol/L (ref 135–145)
Sodium: 143 mmol/L (ref 135–145)
TCO2: 26 mmol/L (ref 22–32)
TCO2: 21 mmol/L — ABNORMAL LOW (ref 22–32)
TCO2: 27 mmol/L (ref 22–32)
TCO2: 24 mmol/L (ref 22–32)
TCO2: 21 mmol/L — ABNORMAL LOW (ref 22–32)
TCO2: 21 mmol/L — ABNORMAL LOW (ref 22–32)
pCO2 arterial: 48.3 mmHg — ABNORMAL HIGH (ref 32–48)
pCO2 arterial: 31.1 mmHg — ABNORMAL LOW (ref 32–48)
pCO2 arterial: 43.9 mmHg (ref 32–48)
pCO2 arterial: 48.3 mmHg — ABNORMAL HIGH (ref 32–48)
pCO2 arterial: 33.4 mmHg (ref 32–48)
pCO2 arterial: 30.6 mmHg — ABNORMAL LOW (ref 32–48)
pH, Arterial: 7.318 — ABNORMAL LOW (ref 7.35–7.45)
pH, Arterial: 7.428 (ref 7.35–7.45)
pH, Arterial: 7.371 (ref 7.35–7.45)
pH, Arterial: 7.275 — ABNORMAL LOW (ref 7.35–7.45)
pH, Arterial: 7.376 (ref 7.35–7.45)
pH, Arterial: 7.426 (ref 7.35–7.45)
pO2, Arterial: 386 mmHg — ABNORMAL HIGH (ref 83–108)
pO2, Arterial: 408 mmHg — ABNORMAL HIGH (ref 83–108)
pO2, Arterial: 191 mmHg — ABNORMAL HIGH (ref 83–108)
pO2, Arterial: 106 mmHg (ref 83–108)
pO2, Arterial: 134 mmHg — ABNORMAL HIGH (ref 83–108)
pO2, Arterial: 65 mmHg — ABNORMAL LOW (ref 83–108)

## 2024-08-26 LAB — POCT I-STAT EG7
Acid-base deficit: 1 mmol/L (ref 0.0–2.0)
Acid-base deficit: 1 mmol/L (ref 0.0–2.0)
Acid-base deficit: 3 mmol/L — ABNORMAL HIGH (ref 0.0–2.0)
Bicarbonate: 25.1 mmol/L (ref 20.0–28.0)
Bicarbonate: 22.8 mmol/L (ref 20.0–28.0)
Bicarbonate: 24.1 mmol/L (ref 20.0–28.0)
Calcium, Ion: 1.22 mmol/L (ref 1.15–1.40)
Calcium, Ion: 1.03 mmol/L — ABNORMAL LOW (ref 1.15–1.40)
Calcium, Ion: 1.1 mmol/L — ABNORMAL LOW (ref 1.15–1.40)
HCT: 35 % — ABNORMAL LOW (ref 36.0–46.0)
HCT: 26 % — ABNORMAL LOW (ref 36.0–46.0)
HCT: 33 % — ABNORMAL LOW (ref 36.0–46.0)
Hemoglobin: 11.9 g/dL — ABNORMAL LOW (ref 12.0–15.0)
Hemoglobin: 8.8 g/dL — ABNORMAL LOW (ref 12.0–15.0)
Hemoglobin: 11.2 g/dL — ABNORMAL LOW (ref 12.0–15.0)
O2 Saturation: 77 %
O2 Saturation: 81 %
O2 Saturation: 54 %
Patient temperature: 37.5
Potassium: 3.7 mmol/L (ref 3.5–5.1)
Potassium: 3.7 mmol/L (ref 3.5–5.1)
Potassium: 3.4 mmol/L — ABNORMAL LOW (ref 3.5–5.1)
Sodium: 139 mmol/L (ref 135–145)
Sodium: 139 mmol/L (ref 135–145)
Sodium: 143 mmol/L (ref 135–145)
TCO2: 27 mmol/L (ref 22–32)
TCO2: 24 mmol/L (ref 22–32)
TCO2: 26 mmol/L (ref 22–32)
pCO2, Ven: 48.8 mmHg (ref 44–60)
pCO2, Ven: 34.7 mmHg — ABNORMAL LOW (ref 44–60)
pCO2, Ven: 52.1 mmHg (ref 44–60)
pH, Ven: 7.319 (ref 7.25–7.43)
pH, Ven: 7.426 (ref 7.25–7.43)
pH, Ven: 7.276 (ref 7.25–7.43)
pO2, Ven: 46 mmHg — ABNORMAL HIGH (ref 32–45)
pO2, Ven: 44 mmHg (ref 32–45)
pO2, Ven: 34 mmHg (ref 32–45)

## 2024-08-26 LAB — PLATELET COUNT: Platelets: 100 K/uL — ABNORMAL LOW (ref 150–400)

## 2024-08-26 LAB — PROTIME-INR
INR: 1.8 — ABNORMAL HIGH (ref 0.8–1.2)
Prothrombin Time: 21.5 s — ABNORMAL HIGH (ref 11.4–15.2)

## 2024-08-26 LAB — GLUCOSE, CAPILLARY
Glucose-Capillary: 107 mg/dL — ABNORMAL HIGH (ref 70–99)
Glucose-Capillary: 118 mg/dL — ABNORMAL HIGH (ref 70–99)
Glucose-Capillary: 130 mg/dL — ABNORMAL HIGH (ref 70–99)
Glucose-Capillary: 131 mg/dL — ABNORMAL HIGH (ref 70–99)
Glucose-Capillary: 143 mg/dL — ABNORMAL HIGH (ref 70–99)
Glucose-Capillary: 143 mg/dL — ABNORMAL HIGH (ref 70–99)
Glucose-Capillary: 154 mg/dL — ABNORMAL HIGH (ref 70–99)

## 2024-08-26 LAB — BASIC METABOLIC PANEL WITH GFR
Anion gap: 11 (ref 5–15)
BUN: 14 mg/dL (ref 8–23)
CO2: 22 mmol/L (ref 22–32)
Calcium: 7.6 mg/dL — ABNORMAL LOW (ref 8.9–10.3)
Chloride: 105 mmol/L (ref 98–111)
Creatinine, Ser: 1.07 mg/dL — ABNORMAL HIGH (ref 0.44–1.00)
GFR, Estimated: 58 mL/min — ABNORMAL LOW (ref >60)
Glucose, Bld: 142 mg/dL — ABNORMAL HIGH (ref 70–99)
Potassium: 3.6 mmol/L (ref 3.5–5.1)
Sodium: 138 mmol/L (ref 135–145)

## 2024-08-26 LAB — FIBRINOGEN: Fibrinogen: 296 mg/dL (ref 210–475)

## 2024-08-26 LAB — LACTIC ACID, PLASMA
Lactic Acid, Venous: 2.7 mmol/L (ref 0.5–1.9)
Lactic Acid, Venous: 3.9 mmol/L (ref 0.5–1.9)

## 2024-08-26 LAB — HEMOGLOBIN AND HEMATOCRIT, BLOOD
HCT: 23.9 % — ABNORMAL LOW (ref 36.0–46.0)
Hemoglobin: 8.2 g/dL — ABNORMAL LOW (ref 12.0–15.0)

## 2024-08-26 LAB — CG4 I-STAT (LACTIC ACID): Lactic Acid, Venous: 4.3 mmol/L (ref 0.5–1.9)

## 2024-08-26 LAB — MAGNESIUM: Magnesium: 3.5 mg/dL — ABNORMAL HIGH (ref 1.7–2.4)

## 2024-08-26 LAB — APTT: aPTT: 41 s — ABNORMAL HIGH (ref 24–36)

## 2024-08-26 LAB — ABO/RH: ABO/RH(D): B POS

## 2024-08-26 SURGERY — REPLACEMENT, AORTIC VALVE, OPEN
Anesthesia: General | Site: Chest

## 2024-08-26 MED ORDER — PANTOPRAZOLE SODIUM 40 MG PO TBEC
40.0000 mg | DELAYED_RELEASE_TABLET | Freq: Every day | ORAL | Status: DC
  Administered 2024-08-28 – 2024-08-30 (×2): 40 mg via ORAL
  Filled 2024-08-26 (×2): qty 1

## 2024-08-26 MED ORDER — LIDOCAINE 2% (20 MG/ML) 5 ML SYRINGE
INTRAMUSCULAR | Status: AC
  Filled 2024-08-26: qty 5

## 2024-08-26 MED ORDER — METOCLOPRAMIDE HCL 5 MG/ML IJ SOLN
10.0000 mg | Freq: Four times a day (QID) | INTRAMUSCULAR | Status: AC
  Administered 2024-08-26 – 2024-08-28 (×6): 10 mg via INTRAVENOUS
  Filled 2024-08-26 (×6): qty 2

## 2024-08-26 MED ORDER — SODIUM BICARBONATE 8.4 % IV SOLN
50.0000 meq | Freq: Once | INTRAVENOUS | Status: AC
  Administered 2024-08-26: 50 meq via INTRAVENOUS

## 2024-08-26 MED ORDER — ORAL CARE MOUTH RINSE: 15.0000 mL | Freq: Once | OROMUCOSAL | Status: AC

## 2024-08-26 MED ORDER — SODIUM CHLORIDE 0.9 % IV SOLN
20.0000 ug | Freq: Once | INTRAVENOUS | Status: AC
  Administered 2024-08-26: 20 ug via INTRAVENOUS
  Filled 2024-08-26: qty 5

## 2024-08-26 MED ORDER — IPRATROPIUM-ALBUTEROL 0.5-2.5 (3) MG/3ML IN SOLN
3.0000 mL | RESPIRATORY_TRACT | Status: DC
  Administered 2024-08-26 – 2024-09-01 (×33): 3 mL via RESPIRATORY_TRACT
  Filled 2024-08-26 (×37): qty 3

## 2024-08-26 MED ORDER — VANCOMYCIN HCL IN DEXTROSE 1-5 GM/200ML-% IV SOLN
1000.0000 mg | Freq: Once | INTRAVENOUS | Status: AC
  Administered 2024-08-26: 1000 mg via INTRAVENOUS
  Filled 2024-08-26: qty 200

## 2024-08-26 MED ORDER — VASOPRESSIN 20 UNIT/ML IV SOLN
INTRAVENOUS | Status: DC | PRN
  Administered 2024-08-26 (×3): 1 [IU] via INTRAVENOUS

## 2024-08-26 MED ORDER — SODIUM CHLORIDE 0.9 % IV SOLN: INTRAVENOUS | Status: AC

## 2024-08-26 MED ORDER — PANTOPRAZOLE SODIUM 40 MG IV SOLR: 40.0000 mg | Freq: Every day | INTRAVENOUS | Status: DC

## 2024-08-26 MED ORDER — METOPROLOL TARTRATE 25 MG/10 ML ORAL SUSPENSION: 12.5000 mg | Freq: Two times a day (BID) | ORAL | Status: DC

## 2024-08-26 MED ORDER — FAMOTIDINE 20 MG PO TABS: 20.0000 mg | ORAL_TABLET | Freq: Two times a day (BID) | ORAL | Status: DC

## 2024-08-26 MED ORDER — ALBUTEROL SULFATE (2.5 MG/3ML) 0.083% IN NEBU: 2.5000 mg | INHALATION_SOLUTION | Freq: Four times a day (QID) | RESPIRATORY_TRACT | Status: DC | PRN

## 2024-08-26 MED ORDER — HYDROMORPHONE HCL 1 MG/ML IJ SOLN
INTRAMUSCULAR | Status: DC | PRN
  Administered 2024-08-26: .5 mg via INTRAVENOUS

## 2024-08-26 MED ORDER — INSULIN REGULAR(HUMAN) IN NACL 100-0.9 UT/100ML-% IV SOLN: INTRAVENOUS | Status: DC

## 2024-08-26 MED ORDER — ORAL CARE MOUTH RINSE: 15.0000 mL | OROMUCOSAL | Status: DC | PRN

## 2024-08-26 MED ORDER — AMIODARONE HCL IN DEXTROSE 360-4.14 MG/200ML-% IV SOLN
INTRAVENOUS | Status: DC | PRN
  Administered 2024-08-26: 60 mg/h via INTRAVENOUS

## 2024-08-26 MED ORDER — POTASSIUM CHLORIDE 10 MEQ/50ML IV SOLN
10.0000 meq | INTRAVENOUS | Status: AC
  Administered 2024-08-26 (×3): 10 meq via INTRAVENOUS

## 2024-08-26 MED ORDER — ASPIRIN 81 MG PO CHEW
324.0000 mg | CHEWABLE_TABLET | Freq: Once | ORAL | Status: AC
  Administered 2024-08-26: 324 mg via ORAL
  Filled 2024-08-26: qty 4

## 2024-08-26 MED ORDER — BISACODYL 10 MG RE SUPP
10.0000 mg | Freq: Every day | RECTAL | Status: DC
  Administered 2024-08-30 – 2024-08-31 (×2): 10 mg via RECTAL
  Filled 2024-08-26 (×2): qty 1

## 2024-08-26 MED ORDER — MIDAZOLAM HCL (PF) 2 MG/2ML IJ SOLN: 1.0000 mg | INTRAMUSCULAR | Status: DC | PRN

## 2024-08-26 MED ORDER — MIDAZOLAM HCL 2 MG/2ML IJ SOLN
INTRAMUSCULAR | Status: AC
Start: 2024-08-26 — End: 2024-08-26
  Filled 2024-08-26: qty 2

## 2024-08-26 MED ORDER — CHLORHEXIDINE GLUCONATE 0.12 % MT SOLN: 15.0000 mL | Freq: Once | OROMUCOSAL | Status: DC

## 2024-08-26 MED ORDER — SODIUM CHLORIDE 0.9 % IV SOLN: 250.0000 mL | INTRAVENOUS | Status: AC

## 2024-08-26 MED ORDER — CEFAZOLIN SODIUM-DEXTROSE 2-4 GM/100ML-% IV SOLN
2.0000 g | Freq: Three times a day (TID) | INTRAVENOUS | Status: AC
  Administered 2024-08-26 – 2024-08-28 (×6): 2 g via INTRAVENOUS
  Filled 2024-08-26 (×6): qty 100

## 2024-08-26 MED ORDER — IPRATROPIUM-ALBUTEROL 0.5-2.5 (3) MG/3ML IN SOLN: 3.0000 mL | RESPIRATORY_TRACT | Status: DC

## 2024-08-26 MED ORDER — 0.9 % SODIUM CHLORIDE (POUR BTL) OPTIME
TOPICAL | Status: DC | PRN
  Administered 2024-08-26: 1000 mL

## 2024-08-26 MED ORDER — HEPARIN SODIUM (PORCINE) 1000 UNIT/ML IJ SOLN
INTRAMUSCULAR | Status: DC | PRN
  Administered 2024-08-26: 35000 [IU] via INTRAVENOUS

## 2024-08-26 MED ORDER — POLYETHYLENE GLYCOL 3350 17 G PO PACK: 17.0000 g | PACK | Freq: Every day | ORAL | Status: DC

## 2024-08-26 MED ORDER — SODIUM CHLORIDE 0.9 % IV SOLN
INTRAVENOUS | Status: DC | PRN
Start: 2024-08-26 — End: 2024-08-26

## 2024-08-26 MED ORDER — DEXMEDETOMIDINE HCL IN NACL 400 MCG/100ML IV SOLN
0.0000 ug/kg/h | INTRAVENOUS | Status: DC
  Administered 2024-08-26 – 2024-08-27 (×2): 0.7 ug/kg/h via INTRAVENOUS
  Filled 2024-08-26 (×3): qty 100

## 2024-08-26 MED ORDER — HYDROMORPHONE HCL 1 MG/ML IJ SOLN
INTRAMUSCULAR | Status: AC
  Filled 2024-08-26: qty 0.5

## 2024-08-26 MED ORDER — VASOPRESSIN 20 UNIT/ML IV SOLN
INTRAVENOUS | Status: AC
Start: 2024-08-26 — End: 2024-08-26
  Filled 2024-08-26: qty 1

## 2024-08-26 MED ORDER — PHENYLEPHRINE 80 MCG/ML (10ML) SYRINGE FOR IV PUSH (FOR BLOOD PRESSURE SUPPORT)
PREFILLED_SYRINGE | INTRAVENOUS | Status: DC | PRN
  Administered 2024-08-26 (×3): 80 ug via INTRAVENOUS

## 2024-08-26 MED ORDER — DOCUSATE SODIUM 100 MG PO CAPS
200.0000 mg | ORAL_CAPSULE | Freq: Every day | ORAL | Status: DC
  Administered 2024-08-28 – 2024-08-30 (×2): 200 mg via ORAL
  Filled 2024-08-26 (×3): qty 2

## 2024-08-26 MED ORDER — MIDAZOLAM HCL 2 MG/2ML IJ SOLN
INTRAMUSCULAR | Status: AC
  Filled 2024-08-26: qty 2

## 2024-08-26 MED ORDER — BISACODYL 5 MG PO TBEC
10.0000 mg | DELAYED_RELEASE_TABLET | Freq: Every day | ORAL | Status: DC
  Administered 2024-08-27 – 2024-09-08 (×7): 10 mg via ORAL
  Filled 2024-08-26 (×10): qty 2

## 2024-08-26 MED ORDER — FENTANYL CITRATE (PF) 50 MCG/ML IJ SOSY: 50.0000 ug | PREFILLED_SYRINGE | INTRAMUSCULAR | Status: DC | PRN

## 2024-08-26 MED ORDER — FENTANYL CITRATE (PF) 250 MCG/5ML IJ SOLN
INTRAMUSCULAR | Status: AC
  Filled 2024-08-26: qty 5

## 2024-08-26 MED ORDER — LACTATED RINGERS IV SOLN: INTRAVENOUS | Status: DC

## 2024-08-26 MED ORDER — ROCURONIUM BROMIDE 10 MG/ML (PF) SYRINGE
PREFILLED_SYRINGE | INTRAVENOUS | Status: DC | PRN
  Administered 2024-08-26: 30 mg via INTRAVENOUS
  Administered 2024-08-26: 50 mg via INTRAVENOUS
  Administered 2024-08-26: 10 mg via INTRAVENOUS
  Administered 2024-08-26: 100 mg via INTRAVENOUS
  Administered 2024-08-26: 20 mg via INTRAVENOUS
  Administered 2024-08-26 (×2): 50 mg via INTRAVENOUS

## 2024-08-26 MED ORDER — FENTANYL CITRATE (PF) 50 MCG/ML IJ SOSY
50.0000 ug | PREFILLED_SYRINGE | INTRAMUSCULAR | Status: DC | PRN
  Filled 2024-08-26: qty 1

## 2024-08-26 MED ORDER — FENTANYL CITRATE (PF) 50 MCG/ML IJ SOSY
50.0000 ug | PREFILLED_SYRINGE | INTRAMUSCULAR | Status: DC | PRN
  Administered 2024-08-27: 100 ug via INTRAVENOUS
  Administered 2024-08-27: 50 ug via INTRAVENOUS
  Filled 2024-08-26: qty 2

## 2024-08-26 MED ORDER — THROMBIN (RECOMBINANT) 20000 UNITS EX SOLR
CUTANEOUS | Status: AC
  Filled 2024-08-26: qty 20000

## 2024-08-26 MED ORDER — LACTATED RINGERS IV SOLN: INTRAVENOUS | Status: DC | PRN

## 2024-08-26 MED ORDER — EPINEPHRINE HCL 5 MG/250ML IV SOLN IN NS
0.0000 ug/min | INTRAVENOUS | Status: DC
  Administered 2024-08-27: 6 ug/min via INTRAVENOUS
  Administered 2024-08-28: 4 ug/min via INTRAVENOUS
  Filled 2024-08-26 (×2): qty 250

## 2024-08-26 MED ORDER — ALBUMIN HUMAN 5 % IV SOLN
250.0000 mL | INTRAVENOUS | Status: DC | PRN
  Administered 2024-08-26 – 2024-08-27 (×3): 12.5 g via INTRAVENOUS
  Filled 2024-08-26 (×2): qty 250

## 2024-08-26 MED ORDER — DEXTROSE 50 % IV SOLN: 0.0000 mL | INTRAVENOUS | Status: DC | PRN

## 2024-08-26 MED ORDER — NITROGLYCERIN IN D5W 200-5 MCG/ML-% IV SOLN: 0.0000 ug/min | INTRAVENOUS | Status: DC

## 2024-08-26 MED ORDER — ACETAMINOPHEN 160 MG/5ML PO SOLN
650.0000 mg | Freq: Once | ORAL | Status: AC
  Administered 2024-08-26: 650 mg
  Filled 2024-08-26: qty 20.3

## 2024-08-26 MED ORDER — PROPOFOL 1000 MG/100ML IV EMUL
0.0000 ug/kg/min | INTRAVENOUS | Status: DC
  Administered 2024-08-26: 30 ug/kg/min via INTRAVENOUS
  Filled 2024-08-26: qty 200

## 2024-08-26 MED ORDER — MIDAZOLAM HCL (PF) 5 MG/ML IJ SOLN
INTRAMUSCULAR | Status: DC | PRN
  Administered 2024-08-26 (×4): 2 mg via INTRAVENOUS

## 2024-08-26 MED ORDER — PLASMA-LYTE A IV SOLN: INTRAVENOUS | Status: DC | PRN

## 2024-08-26 MED ORDER — AMIODARONE IV BOLUS ONLY 150 MG/100ML
INTRAVENOUS | Status: DC | PRN
  Administered 2024-08-26: 150 mg via INTRAVENOUS

## 2024-08-26 MED ORDER — ACETAMINOPHEN 500 MG PO TABS
1000.0000 mg | ORAL_TABLET | Freq: Four times a day (QID) | ORAL | Status: DC
  Administered 2024-08-27 – 2024-08-30 (×7): 1000 mg via ORAL
  Filled 2024-08-26 (×6): qty 2

## 2024-08-26 MED ORDER — SODIUM CHLORIDE 0.45 % IV SOLN
INTRAVENOUS | Status: AC | PRN
Start: 2024-08-26 — End: 2024-08-27

## 2024-08-26 MED ORDER — DOCUSATE SODIUM 50 MG/5ML PO LIQD: 100.0000 mg | Freq: Two times a day (BID) | ORAL | Status: DC

## 2024-08-26 MED ORDER — ~~LOC~~ CARDIAC SURGERY, PATIENT & FAMILY EDUCATION
Freq: Once | Status: DC
  Filled 2024-08-26: qty 1

## 2024-08-26 MED ORDER — ASPIRIN 81 MG PO CHEW
324.0000 mg | CHEWABLE_TABLET | Freq: Every day | ORAL | Status: DC
  Administered 2024-08-27 – 2024-08-31 (×2): 324 mg
  Filled 2024-08-26 (×2): qty 4

## 2024-08-26 MED ORDER — MIDAZOLAM HCL (PF) 2 MG/2ML IJ SOLN
2.0000 mg | INTRAMUSCULAR | Status: DC | PRN
  Administered 2024-08-27 (×3): 2 mg via INTRAVENOUS
  Filled 2024-08-26 (×5): qty 2

## 2024-08-26 MED ORDER — DEXMEDETOMIDINE HCL IN NACL 400 MCG/100ML IV SOLN: 0.0000 ug/kg/h | INTRAVENOUS | Status: DC

## 2024-08-26 MED ORDER — CHLORHEXIDINE GLUCONATE CLOTH 2 % EX PADS
6.0000 | MEDICATED_PAD | Freq: Every day | CUTANEOUS | Status: DC
  Administered 2024-08-26 – 2024-09-12 (×17): 6 via TOPICAL

## 2024-08-26 MED ORDER — PROPOFOL 10 MG/ML IV BOLUS
INTRAVENOUS | Status: AC
Start: 2024-08-26 — End: 2024-08-26
  Filled 2024-08-26: qty 20

## 2024-08-26 MED ORDER — OXYCODONE HCL 5 MG PO TABS
5.0000 mg | ORAL_TABLET | ORAL | Status: DC | PRN
  Administered 2024-08-26 – 2024-08-28 (×8): 10 mg via ORAL
  Filled 2024-08-26 (×8): qty 2

## 2024-08-26 MED ORDER — EPINEPHRINE 1 MG/10ML IV SOSY
PREFILLED_SYRINGE | INTRAVENOUS | Status: DC | PRN
  Administered 2024-08-26 (×5): 10 ug via INTRAVENOUS
  Administered 2024-08-26: 20 ug via INTRAVENOUS
  Administered 2024-08-26 (×4): 10 ug via INTRAVENOUS

## 2024-08-26 MED ORDER — NOREPINEPHRINE 4 MG/250ML-% IV SOLN: 0.0000 ug/min | INTRAVENOUS | Status: DC

## 2024-08-26 MED ORDER — PROTAMINE SULFATE 10 MG/ML IV SOLN
INTRAVENOUS | Status: DC | PRN
  Administered 2024-08-26: 15 mg via INTRAVENOUS
  Administered 2024-08-26: 50 mg via INTRAVENOUS
  Administered 2024-08-26: 335 mg via INTRAVENOUS

## 2024-08-26 MED ORDER — ROCURONIUM BROMIDE 10 MG/ML (PF) SYRINGE
PREFILLED_SYRINGE | INTRAVENOUS | Status: AC
  Filled 2024-08-26: qty 20

## 2024-08-26 MED ORDER — ASPIRIN 325 MG PO TBEC
325.0000 mg | DELAYED_RELEASE_TABLET | Freq: Every day | ORAL | Status: DC
  Administered 2024-08-28 – 2024-09-01 (×4): 325 mg via ORAL
  Filled 2024-08-26 (×4): qty 1

## 2024-08-26 MED ORDER — METOPROLOL TARTRATE 12.5 MG HALF TABLET
12.5000 mg | ORAL_TABLET | Freq: Once | ORAL | Status: DC
  Filled 2024-08-26: qty 1

## 2024-08-26 MED ORDER — CHLORHEXIDINE GLUCONATE 4 % EX SOLN: 30.0000 mL | CUTANEOUS | Status: DC

## 2024-08-26 MED ORDER — ACETAMINOPHEN 160 MG/5ML PO SOLN
1000.0000 mg | Freq: Four times a day (QID) | ORAL | Status: DC
  Administered 2024-08-26 – 2024-08-30 (×5): 1000 mg
  Filled 2024-08-26 (×5): qty 40.6

## 2024-08-26 MED ORDER — PROPOFOL 10 MG/ML IV BOLUS
INTRAVENOUS | Status: DC | PRN
Start: 2024-08-26 — End: 2024-08-26
  Administered 2024-08-26: 50 mg via INTRAVENOUS

## 2024-08-26 MED ORDER — METOPROLOL TARTRATE 5 MG/5ML IV SOLN: 2.5000 mg | INTRAVENOUS | Status: DC | PRN

## 2024-08-26 MED ORDER — PROPOFOL 500 MG/50ML IV EMUL
INTRAVENOUS | Status: DC | PRN
  Administered 2024-08-26: 30 ug/kg/min via INTRAVENOUS

## 2024-08-26 MED ORDER — CHLORHEXIDINE GLUCONATE 0.12 % MT SOLN
15.0000 mL | OROMUCOSAL | Status: AC
  Administered 2024-08-26: 15 mL via OROMUCOSAL
  Filled 2024-08-26: qty 15

## 2024-08-26 MED ORDER — CLEVIDIPINE BUTYRATE 0.5 MG/ML IV EMUL
0.0000 mg/h | INTRAVENOUS | Status: DC
  Administered 2024-08-26 – 2024-08-28 (×2): 2 mg/h via INTRAVENOUS
  Filled 2024-08-26 (×4): qty 100

## 2024-08-26 MED ORDER — LACTATED RINGERS IV SOLN: INTRAVENOUS | Status: AC

## 2024-08-26 MED ORDER — MAGNESIUM SULFATE 4 GM/100ML IV SOLN
4.0000 g | Freq: Once | INTRAVENOUS | Status: AC
Start: 2024-08-26 — End: 2024-08-26
  Administered 2024-08-26: 4 g via INTRAVENOUS
  Filled 2024-08-26: qty 100

## 2024-08-26 MED ORDER — TRAMADOL HCL 50 MG PO TABS
50.0000 mg | ORAL_TABLET | ORAL | Status: DC | PRN
  Administered 2024-08-27 – 2024-08-28 (×3): 100 mg via ORAL
  Filled 2024-08-26 (×3): qty 2

## 2024-08-26 MED ORDER — CHLORHEXIDINE GLUCONATE 0.12 % MT SOLN
15.0000 mL | Freq: Once | OROMUCOSAL | Status: AC
  Administered 2024-08-26: 15 mL via OROMUCOSAL
  Filled 2024-08-26: qty 15

## 2024-08-26 MED ORDER — ROCURONIUM BROMIDE 10 MG/ML (PF) SYRINGE
PREFILLED_SYRINGE | INTRAVENOUS | Status: AC
  Filled 2024-08-26: qty 10

## 2024-08-26 MED ORDER — ORAL CARE MOUTH RINSE
15.0000 mL | OROMUCOSAL | Status: DC
  Administered 2024-08-26 – 2024-08-27 (×11): 15 mL via OROMUCOSAL

## 2024-08-26 MED ORDER — ORAL CARE MOUTH RINSE: 15.0000 mL | OROMUCOSAL | Status: DC

## 2024-08-26 MED ORDER — PANTOPRAZOLE SODIUM 40 MG IV SOLR
40.0000 mg | Freq: Every day | INTRAVENOUS | Status: AC
Start: 2024-08-26 — End: 2024-08-28
  Administered 2024-08-26 – 2024-08-27 (×2): 40 mg via INTRAVENOUS
  Filled 2024-08-26 (×2): qty 10

## 2024-08-26 MED ORDER — ROCURONIUM BROMIDE 10 MG/ML (PF) SYRINGE
PREFILLED_SYRINGE | INTRAVENOUS | Status: AC
Start: 2024-08-26 — End: 2024-08-26
  Filled 2024-08-26: qty 10

## 2024-08-26 MED ORDER — SODIUM CHLORIDE 0.9% FLUSH: 10.0000 mL | INTRAVENOUS | Status: DC | PRN

## 2024-08-26 MED ORDER — LIDOCAINE HCL (CARDIAC) PF 100 MG/5ML IV SOSY
PREFILLED_SYRINGE | INTRAVENOUS | Status: DC | PRN
  Administered 2024-08-26 (×2): 100 mg via INTRAVENOUS

## 2024-08-26 MED ORDER — FENTANYL CITRATE (PF) 250 MCG/5ML IJ SOLN
INTRAMUSCULAR | Status: DC | PRN
Start: 2024-08-26 — End: 2024-08-26
  Administered 2024-08-26 (×5): 100 ug via INTRAVENOUS
  Administered 2024-08-26: 50 ug via INTRAVENOUS
  Administered 2024-08-26 (×2): 100 ug via INTRAVENOUS

## 2024-08-26 MED ORDER — TRANEXAMIC ACID 1000 MG/10ML IV SOLN
1.5000 mg/kg/h | INTRAVENOUS | Status: DC
  Administered 2024-08-26: 1.5 mg/kg/h via INTRAVENOUS
  Filled 2024-08-26: qty 25

## 2024-08-26 MED ORDER — SODIUM CHLORIDE 0.9% FLUSH
3.0000 mL | Freq: Two times a day (BID) | INTRAVENOUS | Status: DC
  Administered 2024-08-27 – 2024-09-12 (×26): 3 mL via INTRAVENOUS

## 2024-08-26 MED ORDER — ONDANSETRON HCL 4 MG/2ML IJ SOLN
4.0000 mg | Freq: Four times a day (QID) | INTRAMUSCULAR | Status: DC | PRN
  Administered 2024-08-27 – 2024-08-31 (×10): 4 mg via INTRAVENOUS
  Filled 2024-08-26 (×10): qty 2

## 2024-08-26 MED ORDER — THROMBIN 20000 UNITS EX SOLR: OROMUCOSAL | Status: DC | PRN

## 2024-08-26 MED ORDER — SODIUM CHLORIDE 0.9% FLUSH
10.0000 mL | Freq: Two times a day (BID) | INTRAVENOUS | Status: DC
  Administered 2024-08-26 – 2024-08-31 (×10): 10 mL

## 2024-08-26 MED ORDER — METOPROLOL TARTRATE 12.5 MG HALF TABLET: 12.5000 mg | ORAL_TABLET | Freq: Two times a day (BID) | ORAL | Status: DC

## 2024-08-26 MED ORDER — HEMOSTATIC AGENTS (NO CHARGE) OPTIME
TOPICAL | Status: DC | PRN
  Administered 2024-08-26 (×2): 1 via TOPICAL

## 2024-08-26 MED ORDER — SODIUM CHLORIDE 0.9% FLUSH: 3.0000 mL | INTRAVENOUS | Status: DC | PRN

## 2024-08-26 MED ORDER — MORPHINE SULFATE (PF) 2 MG/ML IV SOLN
1.0000 mg | INTRAVENOUS | Status: DC | PRN
  Administered 2024-08-27 (×3): 2 mg via INTRAVENOUS
  Administered 2024-08-29: 4 mg via INTRAVENOUS
  Filled 2024-08-26 (×2): qty 1
  Filled 2024-08-26 (×2): qty 2

## 2024-08-26 SURGICAL SUPPLY — 85 items
ADAPTER CARDIO PERF ANTE/RETRO (ADAPTER) ×2 IMPLANT
BAG DECANTER FOR FLEXI CONT (MISCELLANEOUS) ×2 IMPLANT
BENZOIN TINCTURE PRP APPL 2/3 (GAUZE/BANDAGES/DRESSINGS) IMPLANT
BLADE STERNUM SYSTEM 6 (BLADE) ×2 IMPLANT
BLADE SURG 11 STRL SS (BLADE) IMPLANT
BLADE SURG 15 STRL LF DISP TIS (BLADE) ×2 IMPLANT
CANISTER SUCTION 3000ML PPV (SUCTIONS) ×2 IMPLANT
CANNULA AORTIC ROOT 9FR (CANNULA) ×2 IMPLANT
CANNULA FEM BIOMEDICUS 25FR (CANNULA) IMPLANT
CANNULA GUNDRY RCSP 15FR (MISCELLANEOUS) ×2 IMPLANT
CANNULA NON VENT 20FR 12 (CANNULA) IMPLANT
CANNULA PRFSN 3/8X14X24FR PCFC (MISCELLANEOUS) IMPLANT
CATH HEART VENT LEFT (CATHETERS) ×2 IMPLANT
CATH ROBINSON RED A/P 18FR (CATHETERS) ×6 IMPLANT
CATH THOR RT ANG 28F 9128 SOFT (CATHETERS) ×2 IMPLANT
CATH THORACIC 36FR (CATHETERS) ×2 IMPLANT
CIRCUIT VACPAC SAFETY (MISCELLANEOUS) IMPLANT
CNTNR URN SCR LID CUP LEK RST (MISCELLANEOUS) ×2 IMPLANT
CONN ST 3/8 X 1/2 (MISCELLANEOUS) IMPLANT
CONTAINER PROTECT SURGISLUSH (MISCELLANEOUS) ×4 IMPLANT
DEVICE ATRICLIP LAA PRCLPII 40 (Clip) IMPLANT
DEVICE SUT CK QUICK LOAD MINI (Prosthesis & Implant Heart) IMPLANT
DRAPE SRG 135X102X78XABS (DRAPES) ×2 IMPLANT
DRAPE WARM FLUID 44X44 (DRAPES) ×2 IMPLANT
DRESSING PREVENA PLUS CUSTOM (GAUZE/BANDAGES/DRESSINGS) IMPLANT
DRSG TEGADERM 4X4.5 CHG (GAUZE/BANDAGES/DRESSINGS) IMPLANT
ELECTRODE REM PT RTRN 9FT ADLT (ELECTROSURGICAL) ×4 IMPLANT
FELT TEFLON 1X6 (MISCELLANEOUS) ×2 IMPLANT
GAUZE SPONGE 4X4 12PLY STRL (GAUZE/BANDAGES/DRESSINGS) ×2 IMPLANT
GLOVE BIO SURGEON STRL SZ 6.5 (GLOVE) ×4 IMPLANT
GOWN STRL REUS W/ TWL LRG LVL3 (GOWN DISPOSABLE) ×10 IMPLANT
GOWN STRL REUS W/ TWL XL LVL3 (GOWN DISPOSABLE) ×4 IMPLANT
HEMOSTAT POWDER SURGIFOAM 1G (HEMOSTASIS) ×6 IMPLANT
HEMOSTAT SURGICEL 2X14 (HEMOSTASIS) ×2 IMPLANT
INSERT FOGARTY XLG (MISCELLANEOUS) ×2 IMPLANT
KIT BASIN OR (CUSTOM PROCEDURE TRAY) ×2 IMPLANT
KIT DILATOR VASC 18G NDL (KITS) IMPLANT
KIT DRSG PREVENA PLUS 7DAY 125 (MISCELLANEOUS) IMPLANT
KIT SUCTION CATH 14FR (SUCTIONS) ×2 IMPLANT
KIT SUT CK MINI COMBO 4X17 (Prosthesis & Implant Heart) IMPLANT
KIT TURNOVER KIT B (KITS) ×2 IMPLANT
LEAD PACING MYOCARDI (MISCELLANEOUS) IMPLANT
LINE VENT (MISCELLANEOUS) IMPLANT
LOOP VASCLR EXTRA MAXI WHITE (MISCELLANEOUS) IMPLANT
PACK E OPEN HEART (SUTURE) ×2 IMPLANT
PACK OPEN HEART (CUSTOM PROCEDURE TRAY) ×2 IMPLANT
PAD ELECT DEFIB RADIOL ZOLL (MISCELLANEOUS) ×2 IMPLANT
PENCIL BUTTON HOLSTER BLD 10FT (ELECTRODE) ×2 IMPLANT
POSITIONER HEAD DONUT 9IN (MISCELLANEOUS) ×2 IMPLANT
SET MICROPUNCTURE 5F STIFF (MISCELLANEOUS) IMPLANT
SET MPS 3-ND DEL (MISCELLANEOUS) IMPLANT
SHEATH PROBE COVER 6X72 (BAG) IMPLANT
SOLN 0.9% NACL POUR BTL 1000ML (IV SOLUTION) ×10 IMPLANT
SOLN STERILE WATER BTL 1000 ML (IV SOLUTION) ×4 IMPLANT
SPONGE T-LAP 18X18 ~~LOC~~+RFID (SPONGE) IMPLANT
SUT BONE WAX W31G (SUTURE) ×2 IMPLANT
SUT EB EXC GRN/WHT 2-0 V-5 (SUTURE) ×4 IMPLANT
SUT ETHIBON EXCEL 2-0 V-5 (SUTURE) IMPLANT
SUT ETHIBOND 2 0 SH (SUTURE) IMPLANT
SUT ETHIBOND V-5 VALVE (SUTURE) IMPLANT
SUT ETHIBOND X763 2 0 SH 1 (SUTURE) ×8 IMPLANT
SUT PROLENE 4 0 SH DA (SUTURE) ×2 IMPLANT
SUT PROLENE 4-0 RB1 .5 CRCL 36 (SUTURE) ×2 IMPLANT
SUT PROLENE 6 0 C 1 30 (SUTURE) IMPLANT
SUT SILK 1 MH (SUTURE) IMPLANT
SUT STEEL 6MS V (SUTURE) ×2 IMPLANT
SUT STEEL STERNAL CCS#1 18IN (SUTURE) IMPLANT
SUT STEEL SZ 6 DBL 3X14 BALL (SUTURE) ×4 IMPLANT
SUT VIC AB 1 CTX36XBRD ANBCTR (SUTURE) ×4 IMPLANT
SUT VIC AB 2-0 CT1 TAPERPNT 27 (SUTURE) ×4 IMPLANT
SUT VIC AB 3-0 SH 27X BRD (SUTURE) IMPLANT
SYSTEM SAHARA CHEST DRAIN ATS (WOUND CARE) ×2 IMPLANT
TAPE CLOTH SURG 4X10 WHT LF (GAUZE/BANDAGES/DRESSINGS) IMPLANT
TAPE PAPER 2X10 WHT MICROPORE (GAUZE/BANDAGES/DRESSINGS) IMPLANT
TOWEL GREEN STERILE (TOWEL DISPOSABLE) ×2 IMPLANT
TOWEL GREEN STERILE FF (TOWEL DISPOSABLE) ×2 IMPLANT
TRAY CATH LUMEN 1 20CM STRL (SET/KITS/TRAYS/PACK) IMPLANT
TRAY FOLEY SLVR 14FR TEMP STAT (SET/KITS/TRAYS/PACK) IMPLANT
TUBE CONNECTING 20X1/4 (TUBING) IMPLANT
TUBE SUCT INTRACARD DLP 20F (MISCELLANEOUS) ×2 IMPLANT
TUBING ART PRESS 48 MALE/FEM (TUBING) IMPLANT
UNDERPAD 30X36 HEAVY ABSORB (UNDERPADS AND DIAPERS) ×2 IMPLANT
VALVE AORTIC SZ21 INSP/RESIL (Valve) IMPLANT
VALVE MITRAL MITRIS RESILIA 27 (Valve) IMPLANT
WIRE ROSEN-J .035X260CM (WIRE) IMPLANT

## 2024-08-26 NOTE — Consult Note (Addendum)
 NAME:  Katie Jacobson, MRN:  [id number], DOB:  01-13-1956, LOS: 0 ADMISSION DATE:  08/26/2024, CONSULTATION DATE:  08/26/24 REFERRING MD:  Daniel, CHIEF COMPLAINT:  s/p AVR MVR LAA clipping  History of Present Illness:  [name] 2/2 rheumatic valve dz, tobacco use, breast ca s/p R mastectomy and radiation, HTN, HLD  who presented to [facility] 08/26/24 for planned AVR MVR LAA clipping.  Admitted to ICU post op as per pre-op plan PCCM consulted in this setting    Xclamp 232 Total pump 177 EBL 1250 Product 1 Plt 746 cellsaver   Pertinent  Medical History  Rheumatic valve disease Severe AI Severe mitral regurg  HTN HLD Tobacco use  Breast ca s/p R mastectomy, radiation  Significant Hospital Events: Including procedures, antibiotic start and stop dates in addition to other pertinent events   08/26/24 AVR MVR LAA clipping   Interim History / Subjective:  POD 0  Ebl 1250 rvcd 1 plt 746 cellsaver and ddavp  Objective    Blood pressure 103/63, pulse 69, temperature (P) 98.6 F (37 C), temperature source (P) Oral, resp. rate 18, height 5' 1 (1.549 m), weight (P) 97.5 kg, last menstrual period 10/10/1996, SpO2 100%. PAP: (17-40)/(-9-12) 19/1  Vent Mode: SIMV;PSV FiO2 (%):  [50 %] 50 % Set Rate:  [16 bmp] 16 bmp Vt Set:  [390 mL] 390 mL PEEP:  [5 cmH20] 5 cmH20 Pressure Support:  [10 cmH20] 10 cmH20 Plateau Pressure:  [16 cmH20] 16 cmH20   Intake/Output Summary (Last 24 hours) at 08/26/2024 1621 Last data filed at 08/26/2024 1528 Gross per 24 hour  Intake 3246 ml  Output 2550 ml  Net 696 ml   Filed Weights   08/26/24 0601  Weight: (P) 97.5 kg    Examination: General: critically and chronically ill F intubated sedated  HENT: ETT secure anicteric sclera  Lungs: Coarse bilaterally. Mechanically ventilated  Cardiovascular: paced 90. Chest tube w scant bloody output. Wires.   Abdomen: soft ndnt  Extremities: no acute joint deformity  Neuro:  sedated Pupils 2mm reactive, triggering vent   GU: foley   Resolved problem list   Assessment and Plan   Severe Mitral regurg s/p MVR Severe AI s/p AVR S/p LAA clipping  Endotracheally intubated Expected ABLA 2/2 surgery Expected thrombocytopenia  Post cardiotomy vasoplegia, hypotension  Acute HFrEF (moderately reduced on TEE end of case)  Hx HTN Hx HLD Hx Tobacco use disorder  Obstructive vs mixed obstructive and restrictive lung dz (PFTs 11/13) Hx breast ca s/p R mastectomy and radiation  Anxiety  P -post op, L/T/D/W per cvts -CXR ABG in ICU -MV support, will likely remain intubated overnight w WUA/SBT in AM. RASS 0/-1 overnight  -post op labs -epi 6, titrate NE for MAP 65-85.  -dc amio -cont pacing  -insulin gtt -complete txa -complete ppx abx -bb when appropriate  -multimodal analgesia -early mobility  -duoneb   Labs   CBC: Recent Labs  Lab 08/22/24 1034 08/26/24 0805 08/26/24 1215 08/26/24 1236 08/26/24 1334 08/26/24 1337 08/26/24 1446  WBC 6.7  --   --   --   --   --   --   HGB 14.1   < > 8.2* 8.5* 7.5* 8.8* 9.9*  HCT 42.7   < > 23.9* 25.0* 22.0* 26.0* 29.0*  MCV 97.5  --   --   --   --   --   --   PLT 238  --  100*  --   --   --   --    < > = values in this interval not displayed.    Basic Metabolic Panel: Recent Labs  Lab 08/22/24 1034 08/26/24 0805 08/26/24 1056 08/26/24 1150 08/26/24 1236 08/26/24 1334 08/26/24 1337 08/26/24 1446  NA 142   < > 138 136 138 139 139 141  K 3.9   < > 4.6 4.0 4.3 3.9 3.9 3.9  CL 104   < > 99 98 101  --  103 104  CO2 27  --   --   --   --   --   --   --   GLUCOSE 98   < > 125* 125* 127*  --  130* 175*  BUN 15   < > 15 15 16   --  15 14  CREATININE 0.88   < > 0.80 0.80 0.80  --  0.70 0.90  CALCIUM 9.3  --   --   --   --   --   --   --    < > = values in this interval not displayed.   GFR: Estimated Creatinine Clearance: 66.9 mL/min (by C-G formula based on SCr of 0.9 mg/dL). Recent Labs  Lab  08/22/24 1034  WBC 6.7    Liver Function Tests: Recent Labs  Lab 08/22/24 1034  AST 17  ALT 16  ALKPHOS 71  BILITOT 0.7  PROT 7.6  ALBUMIN 3.4*   No results for input(s): LIPASE, AMYLASE in the last 168 hours. No results for input(s): AMMONIA in the last 168 hours.  ABG    Component Value Date/Time   PHART 7.371 08/26/2024 1334   PCO2ART 43.9 08/26/2024 1334   PO2ART 191 (H) 08/26/2024 1334   HCO3 25.4 08/26/2024 1334   TCO2 22 08/26/2024 1446   ACIDBASEDEF 1.0 08/26/2024 0929   O2SAT 100 08/26/2024 1334     Coagulation Profile: Recent Labs  Lab 08/22/24 1034  INR 1.0    Cardiac Enzymes: No results for input(s): CKTOTAL, CKMB, CKMBINDEX, TROPONINI in the last 168 hours.  HbA1C: Hgb A1c MFr Bld  Date/Time Value Ref Range Status  08/22/2024 10:34 AM 5.5 4.8 - 5.6 % Final    Comment:    (NOTE) Diagnosis of Diabetes The following HbA1c ranges recommended by the American Diabetes Association (ADA) may be used as an aid in the diagnosis of diabetes mellitus.  Hemoglobin             Suggested A1C NGSP%              Diagnosis  <5.7                   Non Diabetic  5.7-6.4                Pre-Diabetic  >6.4                   Diabetic  <7.0                   Glycemic control for                       adults with diabetes.      CBG: No results for input(s): GLUCAP in the last 168 hours.  Review of Systems:   Unable to obtain intubated sedated  Past Medical History:  She,  has a past medical history of Allergy, Arthritis, Breast cancer of  upper-outer quadrant of right female breast (HCC) (11/14/2014), Heart murmur, History of chemotherapy, History of kidney stones, History of seizures, Hypertension, Mitral regurgitation, Mitral valve prolapse (1990), Osteoporosis, Personal history of chemotherapy, Personal history of radiation therapy, Port-A-Cath in place (01/24/2023), and Seasonal allergies.   Surgical History:   Past Surgical History:   Procedure Laterality Date   ABDOMINAL HYSTERECTOMY  1998   partial   APPENDECTOMY  1976   BREAST BIOPSY Right 09/30/2022   US  RT BREAST BX W LOC DEV 1ST LESION IMG BX SPEC US  GUIDE 09/30/2022 [facility]   BREAST BIOPSY Left 09/30/2022   US  LT BREAST BX W LOC DEV 1ST LESION IMG BX SPEC US  GUIDE 09/30/2022 [facility]   BREAST BIOPSY Right 09/30/2022   US  RT BREAST BX W LOC DEV EA ADD LESION IMG BX SPEC US  GUIDE 09/30/2022 [facility]   BREAST BIOPSY  12/14/2022   MM LT RADIOACTIVE SEED LOC MAMMO GUIDE 12/14/2022 [facility]   BREAST LUMPECTOMY Right 02/2015   BREAST LUMPECTOMY WITH RADIOACTIVE SEED AND SENTINEL LYMPH NODE BIOPSY Left 12/15/2022   Procedure: LEFT BREAST LUMPECTOMY WITH RADIOACTIVE SEED AND AXILLARY SENTINEL LYMPH NODE BIOPSY;  Surgeon: Ebbie Cough, MD;  Location: [facility] OR;  Service: General;  Laterality: Left;   COLONOSCOPY  09/30/2016   COMPLEX WOUND CLOSURE N/A 07/18/2023   Procedure: CLOSURE CHEST WALL WOUND X2;  Surgeon: Ebbie Cough, MD;  Location: [facility] OR;  Service: General;  Laterality: N/A;   KNEE ARTHROSCOPY Left 03/26/2008   MASTECTOMY W/ SENTINEL NODE BIOPSY Right 12/15/2022   Procedure: RIGHT MASTECTOMY WITH RIGHT AXILLARY SENTINEL LYMPH NODE BIOPSY;  Surgeon: Ebbie Cough, MD;  Location: [facility] OR;  Service: General;  Laterality: Right;  180 MIN ROOM 9   PLANTAR'S WART EXCISION Left    x 2   PORT-A-CATH REMOVAL Left 04/06/2015   Procedure: REMOVAL PORT-A-CATH;  Surgeon: Cough Ebbie, MD;  Location: [facility];  Service: General;  Laterality: Left;   PORT-A-CATH REMOVAL N/A 07/18/2023   Procedure: REMOVAL PORT-A-CATH;  Surgeon: Ebbie Cough, MD;  Location: [facility] OR;  Service: General;  Laterality: N/A;   PORTACATH PLACEMENT N/A 11/27/2014   Procedure: INSERTION PORT-A-CATH;  Surgeon: Cough Ebbie, MD;  Location: [facility];  Service: General;  Laterality: N/A;   PORTACATH  PLACEMENT Right 12/15/2022   Procedure: INSERTION PORT-A-CATH;  Surgeon: Ebbie Cough, MD;  Location: [facility] OR;  Service: General;  Laterality: Right;   RADIOACTIVE SEED GUIDED PARTIAL MASTECTOMY WITH AXILLARY SENTINEL LYMPH NODE BIOPSY Right 03/02/2015   Procedure: RADIOACTIVE SEED GUIDED RIGHT PARTIAL MASTECTOMY WITH RIGHT AXILLARY SENTINEL LYMPH NODE BIOPSY;  Surgeon: Cough Ebbie, MD;  Location: [facility];  Service: General;  Laterality: Right;   RIGHT/LEFT HEART CATH AND CORONARY ANGIOGRAPHY N/A 07/16/2024   Procedure: RIGHT/LEFT HEART CATH AND CORONARY ANGIOGRAPHY;  Surgeon: Anner Alm ORN, MD;  Location: [facility] INVASIVE CV LAB;  Service: Cardiovascular;  Laterality: N/A;   SALIVARY STONE REMOVAL Right 1972   TONSILLECTOMY  1970s   TRANSESOPHAGEAL ECHOCARDIOGRAM (CATH LAB) N/A 06/21/2024   Procedure: TRANSESOPHAGEAL ECHOCARDIOGRAM;  Surgeon: Barbaraann Darryle Ned, MD;  Location: [facility] INVASIVE CV LAB;  Service: Cardiovascular;  Laterality: N/A;   TRANSTHORACIC ECHOCARDIOGRAM  01/16/2020   EF 60 to 65%.  GRII DD.  No R WMA.  Normal RV size and function.  Rheumatic mitral valve with moderate thickening-hockey-stick appearing.  Moderate MR with mild MS.  Moderate LA dilation.  (Recommend follow-up in 2 to 3 years)   TRANSTHORACIC  ECHOCARDIOGRAM  05/31/2022   Normal LV size and function-EF 60 to 65%.  No RWMA. ??  Normal diastolic parameters but elevated LAP?  Normal RV with normal RVP and RAP.  Manage mitral valve with mild MR and no MS.  Mild to moderate AI with aortic sclerosis but no stenosis.   TUBAL LIGATION  1996     Social History:   reports that she has been smoking cigarettes. She has a 21.5 pack-year smoking history. She has never used smokeless tobacco. She reports that she does not currently use alcohol. She reports that she does not use drugs.   Family History:  Her family history includes Breast cancer in her cousin; Cancer in her cousin; Colon polyps in her  daughter; Diabetes in her mother, sister, and sister; Heart attack in her father; Heart disease in her mother; Hypertension in her mother; Kidney disease in her brother; Mental retardation in her brother; Stroke in her mother. There is no history of Colon cancer, Esophageal cancer, Rectal cancer, or Stomach cancer.   Allergies Allergies  Allergen Reactions   Sulfa Antibiotics Other (See Comments)    UNKNOWN     Home Medications  Prior to Admission medications   Medication Sig Start Date End Date Taking? Authorizing Provider  aspirin  81 MG tablet Take 1 tablet (81 mg total) by mouth daily. 04/21/15  Yes Magrinat, Sandria BROCKS, MD  chlorthalidone  (HYGROTON ) 25 MG tablet TAKE 1 TABLET(25 MG) BY MOUTH EVERY OTHER DAY Patient taking differently: Take 25 mg by mouth every Monday, Wednesday, and Friday. 04/03/24  Yes Daryl Setter, NP  enalapril  (VASOTEC ) 20 MG tablet Take 1 tablet (20 mg total) by mouth daily. Patient taking differently: Take 20 mg by mouth at bedtime. 04/03/24  Yes Daryl Setter, NP  metoprolol  succinate (TOPROL -XL) 100 MG 24 hr tablet Take 1 tablet (100 mg total) by mouth daily. TAKE WITH OR IMMEDIATELY FOLLOWING A MEAL. Patient taking differently: Take 100 mg by mouth at bedtime. TAKE WITH OR IMMEDIATELY FOLLOWING A MEAL. 04/03/24  Yes Daryl Setter, NP  albuterol  (VENTOLIN  HFA) 108 (90 Base) MCG/ACT inhaler Inhale 1-2 puffs into the lungs every 6 (six) hours as needed for wheezing or shortness of breath. 05/23/22   O'Sullivan, Melissa, NP  fluticasone  (FLONASE ) 50 MCG/ACT nasal spray Place 1 spray into both nostrils as needed for allergies or rhinitis.    [provider]     Critical care time: 40  min       CRITICAL CARE Performed by: Ronnald FORBES Gave   Total critical care time: 40 minutes  Critical care time was exclusive of separately billable procedures and treating other patients. Critical care was necessary to treat or prevent imminent or  life-threatening deterioration.  Critical care was time spent personally by me on the following activities: development of treatment plan with patient and/or surrogate as well as nursing, discussions with consultants, evaluation of patient's response to treatment, examination of patient, obtaining history from patient or surrogate, ordering and performing treatments and interventions, ordering and review of laboratory studies, ordering and review of radiographic studies, pulse oximetry and re-evaluation of patient's condition.  Ronnald Gave MSN, AGACNP-BC [facility] Amion for pager  08/26/2024, 4:21 PM

## 2024-08-26 NOTE — Anesthesia Postprocedure Evaluation (Signed)
 Anesthesia Post Note  Patient: Katie Jacobson  Procedure(s) Performed: REPLACEMENT, AORTIC VALVE, OPEN, UTILIZING INSPIRIS RESILIA  AORTIC VALVE (Chest) REPLACEMENT, MITRAL VALVE, UTILIZING MITRIS RESILIA MITRAL VALVE (Chest) CLIPPING, LEFT ATRIAL APPENDAGE UTILIZING ATRICURE PRO 40 ECHOCARDIOGRAM, TRANSESOPHAGEAL, INTRAOPERATIVE     Patient location during evaluation: SICU Anesthesia Type: General Level of consciousness: sedated Pain management: pain level controlled Vital Signs Assessment: post-procedure vital signs reviewed and stable Respiratory status: patient remains intubated per anesthesia plan Cardiovascular status: stable Postop Assessment: no apparent nausea or vomiting Anesthetic complications: no   No notable events documented.  Last Vitals:  Vitals:   08/26/24 0727 08/26/24 1611  BP:    Pulse:    Resp: 18   Temp:    SpO2:  100%    Last Pain:  Vitals:   08/26/24 0628  TempSrc:   PainSc: 0-No pain                 Thom JONELLE Peoples

## 2024-08-26 NOTE — Op Note (Signed)
 08/26/2024 Katie Jacobson [id number]  Surgeon:  Con Clunes, MD  First Assistant:  Dorise Fellers, MD  Experienced assistance was necessary for this case due to surgical complexity. Dr. Fellers assisted with retraction of delicate tissues, exposure, suctioning, and suture management during the entire operation.  Preoperative Diagnosis:  Severe AI, moderate AS, severe MR, mild-mod MR  Postoperative Diagnosis:  Same  Procedure:  Median Sternotomy Extracorporeal circulation Femoral venous cannulation 4.   Aortic valve replacement using a 21 mm Inspiris valve 5.   Mitral valve replacement using a 27 mm Mitris valve 6.   Left atrial appendage ligation  Anesthesia:  General Endotracheal  Clinical History/Surgical Indication:  Katie Jacobson is a 68 y.o. female who presents for surgical evaluation of severe AI/moderate AS and severe MR/mild-mod MS in the setting of rheumatic valve disease. While she has remained very functional, she has progressively worsening shortness of breath.  She is still actively smoking but knows she needs to quit.  Her left atrium is severely dilated and she is at high risk for atrial fibrillation post-operatively so we will clip her left atrial appendage at the time of surgery.   Preparation:  The patient was seen in the preoperative holding area and the correct patient, correct operation were confirmed with the patient after reviewing the medical record and catheterization. The consent was signed by me. Preoperative antibiotics were given. A pulmonary arterial line and radial arterial line were placed by the anesthesia team. The patient was taken back to the operating room and positioned supine on the operating room table. After being placed under general endotracheal anesthesia by the anesthesia team a foley catheter was placed. The neck, chest, abdomen, and both legs were prepped with betadine soap and solution and draped in the usual sterile manner. A surgical  time-out was taken and the correct patient and operative procedure were confirmed with the nursing and anesthesia staff.   Pre-bypass TEE:   Complete TEE assessment was performed by Dr. Erma. Moderate AS, moderate AI, mod-to-severe MS, moderate MR. Normal biventricular function.   Post-bypass TEE:   Normal functioning prosthetic aortic valve with no perivalvular leak or regurgitation through the valve. MG 3 mmHg.  Normal functioning prosthetic mitral valve with no perivalvular leak or regurgitation.  MG 3 mmHg.  Left ventricular function moderately reduced. Right ventricular function mildly reduced.   Cardiopulmonary Bypass:  Access to the right femoral vein was obtained with a micropuncture catheter and a Rosen wire was passed into the SVC under TEE guidance.  Then, a median sternotomy was performed. The pericardium was opened in the midline. Right ventricular function appeared normal. The ascending aorta was of normal size and had no palpable plaque. There were no contraindications to aortic cannulation or cross-clamping. The patient was fully systemically heparinized and the ACT was maintained > 400 sec. The proximal aortic arch was cannulated with a 20 F aortic cannula for arterial inflow. The right femoral vein was dilated and a 25 Fr multistage cannula was passed into the right atrium.  Cardiopulmonary bypass was initiated.  The femoral venous cannula was pulled back into the IVC and a right-angle cannula was then placed into the SVC.  A left ventricular vent was placed via the right superior pulmonary vein. The left atrial appendage was sized to a 40 mm clip.  The clip was placed with complete occlusion of the LAA and no ST changes.  A retrograde cardioplegia cannnula was placed into the coronary sinus via the right atrium. Aortic  occlusion was performed with a single cross-clamp. Systemic cooling to 32 degrees Centigrade and topical cooling of the heart with iced saline were used. Carbon  dioxide was insufflated into the pericardium at 5L/min throughout the procedure to minimize intracardiac air.  Aortic valve sizing: A transverse aortotomy was performed 1 cm above the take-off of the right coronary artery. The native valve was tricuspid with thickened leaflet edges and a small tear in the edge of the left leaflet. The ostia of the coronary arteries were in normal position and were not obstructed. The native valve leaflets were excised and the annulus was decalcified with rongeurs. Care was taken to remove all particulate debris. The left ventricle was directly inspected for debris and then irrigated with ice saline solution. The annulus was sized and a size 21 mm inspiris bioprosthetic valve was chosen. The model number was [id number] and the serial number was [id number] .   Mitral valve replacement: The mitral valve was approached through the transeptal technique.  The right atrium was open and 4-0 prolene retraction sutures were placed.  The interatrial septum was incised starting at the fossa ovalis.  Valve inspection showed thickened leaflets with calcification and severe restriction of the posterior leaflet at the mitral annulus.  The anterior leaflet was very thick and therefore excised.  A series of pledgetted 2-0 Ethibond sutures were placed around the mitral annulus. A 27 mm Mitris mitral valve was chosen ( model [id number] , SN [id number] ). The sutures were placed through the sewing ring and it was lowered into place. The sutures were secured with a Corknot. The valve was tested with saline and there was trivial central regurgitation.   Aortic valve replacement:  Next, 2-0 Ethibond pledgeted horizontal mattress sutures were placed around the aortic annulus. The sutures were placed through the sewing ring and the valve lowered into place. The sutures were secured sequentially with Corknot. The valve seated nicely and the coronary ostia were not obstructed. The prosthetic valve leaflets  moved normally and there was no sub-valvular obstruction. The aortotomy was closed using 4-0 Prolene suture in 2 layers with felt strips to reinforce the closure.  The atrial septum was closed with 4-0 Prolene suture in 2 layers.  Completion:  The patient was rewarmed to 37 degrees Centigrade. De-airing maneuvers were performed and the head placed in trendelenburg position. The crossclamp was removed with a time of 176 minutes. The right atrium was closed with 4-0 prolene in two layers.  There was spontaneous return of a junctional rhythm. The aortotomy was checked for hemostasis. Two temporary epicardial pacing wires were placed on the right atrium and two on the right ventricle. The left ventricular vent and retrograde cardioplegia cannulas were removed. The patient was weaned from CPB without difficulty on no inotropes. CPB time was 234 minutes. Cardiac output was 4 LPM. Heparin  was fully reversed with protamine and the aortic and venous cannulas removed. Hemostasis was achieved. Mediastinal drainage tubes were placed.   The sternum was closed with double #6 stainless steel wires. After the chest was closed, the patient became hypotensive with drop in heart function.  Epinephrine  was started and slowly uptitrated.  Ultimately, LV function appeared moderately reduced and RV function was mildly reduced.    The fascia was closed with continuous # 1 vicryl suture. The subcutaneous tissue was closed with 2-0 vicryl continuous suture. The skin was closed with 3-0 vicryl subcuticular suture. All sponge, needle, and instrument counts were reported correct at the end of the case.  A Prevena wound vac was placed over the incisions and around the chest tubes which were connected to pleurevac suction. The patient was then transported to the surgical intensive care unit in critical but stable condition.

## 2024-08-26 NOTE — Progress Notes (Signed)
  TCTS Evening Rounds:   Hemodynamically stable, AV paced 90 on Epi 6. NE weaned off. CI = 1.7 but I doubt that is accurate given other hemodynamics parameters and good UO. SVO2 67  Sedated on vent and plan to leave intubated overnight.  Urine output good  CT output low ECG: non-specific intra-ventricular block  CBC    Component Value Date/Time   WBC 17.3 (H) 08/26/2024 1620   RBC 3.40 (L) 08/26/2024 1620   HGB 11.3 (L) 08/26/2024 1620   HGB 14.9 07/01/2024 1344   HGB 14.4 12/28/2016 1359   HCT 33.2 (L) 08/26/2024 1620   HCT 43.8 07/01/2024 1344   HCT 42.7 12/28/2016 1359   PLT 119 (L) 08/26/2024 1620   PLT 221 07/01/2024 1344   MCV 97.6 08/26/2024 1620   MCV 96 07/01/2024 1344   MCV 93.6 12/28/2016 1359   MCH 33.2 08/26/2024 1620   MCHC 34.0 08/26/2024 1620   RDW 16.0 (H) 08/26/2024 1620   RDW 14.4 07/01/2024 1344   RDW 15.8 (H) 12/28/2016 1359   LYMPHSABS 1.1 03/22/2023 1000   LYMPHSABS 2.1 12/28/2016 1359   MONOABS 0.7 03/22/2023 1000   MONOABS 0.4 12/28/2016 1359   EOSABS 0.0 03/22/2023 1000   EOSABS 0.1 12/28/2016 1359   BASOSABS 0.0 03/22/2023 1000   BASOSABS 0.0 12/28/2016 1359     BMET    Component Value Date/Time   NA 143 08/26/2024 1601   NA 142 07/01/2024 1344   NA 139 12/28/2016 1359   K 3.4 (L) 08/26/2024 1601   K 3.6 12/28/2016 1359   CL 104 08/26/2024 1446   CO2 27 08/22/2024 1034   CO2 25 12/28/2016 1359   GLUCOSE 175 (H) 08/26/2024 1446   GLUCOSE 85 12/28/2016 1359   BUN 14 08/26/2024 1446   BUN 17 07/01/2024 1344   BUN 15.6 12/28/2016 1359   CREATININE 0.90 08/26/2024 1446   CREATININE 0.85 09/22/2023 1405   CREATININE 0.9 12/28/2016 1359   CALCIUM 9.3 08/22/2024 1034   CALCIUM 9.7 12/28/2016 1359   EGFR 77 07/01/2024 1344   GFRNONAA >60 08/22/2024 1034   GFRNONAA >60 03/22/2023 1000   GFRNONAA 70 09/02/2013 1128     A/P:  Stable postop course. Continue current plans

## 2024-08-26 NOTE — H&P (Signed)
 Expand All Collapse All      [address] [place] [address]             [phone]      Katie Jacobson [facility] Medical Record #[id number] Date of Birth: 05-26-1956   Referring: Anner Alm ORN, MD Primary Care: Daryl Setter, NP Primary Cardiologist:David Anner, MD   Chief Complaint:        Chief Complaint  Patient presents with   Mitral Regurgitation      New patient consult, TEE 9/12, CATH 10/7      History of Present Illness:     Katie Jacobson is a 68 y.o. female who presents for surgical evaluation of severe AI/moderate AS and severe MR/mild-mod MS in the setting of rheumatic valve disease.  Currently she actually feels fine considering the degree of her valvular disease.  She denies orthopnea but has some shortness of breath but feels her activity is mostly limited by her legs getting tired.  She is continuing to actively smoke, and smokes half pack per day.  She has smoked for 50 years and was previously a heavier smoker.  She does have an alcoholic beverage every once in a while when she gets stressed/anxious.  Denies leg swelling unless she misses her chlorthalidone .   In regards to her breast cancer, she was diagnosed with triple negative right breast cancer in 2016 status post neoadjuvant chemo, right lumpectomy with sentinel lymph node biopsy.  She then completed adjuvant radiation.  In 2024 she was diagnosed with bilateral breast cancer, and underwent left lumpectomy with sentinel lymph node biopsy as well as right mastectomy.  She then completed adjuvant chemotherapy and bilateral chest photon treatment.  She had difficulty healing her right mastectomy incision and had chronic wound issues but it has finally healed.   She is retired -used to work and her job entailed walking and sitting at [employer].  She lives in a house with her husband and has felt very stressed recently because her daughter just moved back in and has 2 big cats  and doesn't clean up after herself or the cats.   TEE (06/21/24): Rheumatic mitral valve with severe MR, mild to mod MS.  Severe AI with moderate AS.  Normal BiV function, no PFO. LHC: 50% mid LAD lesion CT chest: No significant aortic calcification PFTs: FEV1 58%, DLCO 76%     Past Medical and Surgical History: Previous Chest Surgery: Yes, right mastectomy Previous Chest Radiation: Yes, to right chest for breast cancer Diabetes Mellitus: No.  HbA1C N/A Anticoagulation: No, Last dose N/A   Creatinine:  Recent Labs       Lab Results  Component Value Date    CREATININE 0.84 07/01/2024    CREATININE 0.95 06/07/2024    CREATININE 1.01 04/03/2024              Past Medical History:  Diagnosis Date   Allergy     Arthritis     Breast cancer of upper-outer quadrant of right female breast (HCC) 11/14/2014    Treated with chemotherapy and radiation (diagnosed again in March 2024 in both breasts)   Heart murmur     History of chemotherapy      finished chemo 02/03/2015   History of kidney stones     History of seizures      as a child - unknown cause - states was never on anticonvulsants   Hypertension  states under control with meds., has been on med. x years   Mitral regurgitation     Mitral valve prolapse 1990    Rheumatic mitral valve with moderate to severe MR by echo 05/31/2024 => TEE suggests rheumatic mitral valve with severe calcified leaflets and severe mitral regurgitation but also severe aortic regurgitation   Osteoporosis     Personal history of chemotherapy     Personal history of radiation therapy     Port-A-Cath in place 01/24/2023   Seasonal allergies                 Past Surgical History:  Procedure Laterality Date   ABDOMINAL HYSTERECTOMY   1998    partial   APPENDECTOMY   1976   BREAST BIOPSY Right 09/30/2022    US  RT BREAST BX W LOC DEV 1ST LESION IMG BX SPEC US  GUIDE 09/30/2022 [facility]   BREAST BIOPSY Left 09/30/2022    US  LT  BREAST BX W LOC DEV 1ST LESION IMG BX SPEC US  GUIDE 09/30/2022 [facility]   BREAST BIOPSY Right 09/30/2022    US  RT BREAST BX W LOC DEV EA ADD LESION IMG BX SPEC US  GUIDE 09/30/2022 [facility]   BREAST BIOPSY   12/14/2022    MM LT RADIOACTIVE SEED LOC MAMMO GUIDE 12/14/2022 [facility]   BREAST LUMPECTOMY Right 02/2015   BREAST LUMPECTOMY WITH RADIOACTIVE SEED AND SENTINEL LYMPH NODE BIOPSY Left 12/15/2022    Procedure: LEFT BREAST LUMPECTOMY WITH RADIOACTIVE SEED AND AXILLARY SENTINEL LYMPH NODE BIOPSY;  Surgeon: Ebbie Cough, MD;  Location: [facility];  Service: General;  Laterality: Left;   COLONOSCOPY   09/30/2016   COMPLEX WOUND CLOSURE N/A 07/18/2023    Procedure: CLOSURE CHEST WALL WOUND X2;  Surgeon: Ebbie Cough, MD;  Location: [facility] OR;  Service: General;  Laterality: N/A;   KNEE ARTHROSCOPY Left 03/26/2008   MASTECTOMY       MASTECTOMY W/ SENTINEL NODE BIOPSY Right 12/15/2022    Procedure: RIGHT MASTECTOMY WITH RIGHT AXILLARY SENTINEL LYMPH NODE BIOPSY;  Surgeon: Ebbie Cough, MD;  Location: [facility] OR;  Service: General;  Laterality: Right;  180 MIN ROOM 9   PLANTAR'S WART EXCISION Left      x 2   PORT-A-CATH REMOVAL Left 04/06/2015    Procedure: REMOVAL PORT-A-CATH;  Surgeon: Cough Ebbie, MD;  Location: [facility];  Service: General;  Laterality: Left;   PORT-A-CATH REMOVAL N/A 07/18/2023    Procedure: REMOVAL PORT-A-CATH;  Surgeon: Ebbie Cough, MD;  Location: [facility] OR;  Service: General;  Laterality: N/A;   PORTACATH PLACEMENT N/A 11/27/2014    Procedure: INSERTION PORT-A-CATH;  Surgeon: Cough Ebbie, MD;  Location: [facility];  Service: General;  Laterality: N/A;   PORTACATH PLACEMENT Right 12/15/2022    Procedure: INSERTION PORT-A-CATH;  Surgeon: Ebbie Cough, MD;  Location: [facility] OR;  Service: General;  Laterality: Right;   RADIOACTIVE SEED GUIDED PARTIAL MASTECTOMY WITH AXILLARY SENTINEL LYMPH NODE  BIOPSY Right 03/02/2015    Procedure: RADIOACTIVE SEED GUIDED RIGHT PARTIAL MASTECTOMY WITH RIGHT AXILLARY SENTINEL LYMPH NODE BIOPSY;  Surgeon: Cough Ebbie, MD;  Location: [facility];  Service: General;  Laterality: Right;   RIGHT/LEFT HEART CATH AND CORONARY ANGIOGRAPHY N/A 07/16/2024    Procedure: RIGHT/LEFT HEART CATH AND CORONARY ANGIOGRAPHY;  Surgeon: Anner Alm ORN, MD;  Location: [facility] INVASIVE CV LAB;  Service: Cardiovascular;  Laterality: N/A;   SALIVARY STONE REMOVAL Right 1972   TONSILLECTOMY   1970s   TRANSESOPHAGEAL  ECHOCARDIOGRAM (CATH LAB) N/A 06/21/2024    Procedure: TRANSESOPHAGEAL ECHOCARDIOGRAM;  Surgeon: Barbaraann Darryle Ned, MD;  Location: [facility] INVASIVE CV LAB;  Service: Cardiovascular;  Laterality: N/A;   TRANSTHORACIC ECHOCARDIOGRAM   01/16/2020    EF 60 to 65%.  GRII DD.  No R WMA.  Normal RV size and function.  Rheumatic mitral valve with moderate thickening-hockey-stick appearing.  Moderate MR with mild MS.  Moderate LA dilation.  (Recommend follow-up in 2 to 3 years)   TRANSTHORACIC ECHOCARDIOGRAM   05/31/2022    Normal LV size and function-EF 60 to 65%.  No RWMA. ??  Normal diastolic parameters but elevated LAP?  Normal RV with normal RVP and RAP.  Manage mitral valve with mild MR and no MS.  Mild to moderate AI with aortic sclerosis but no stenosis.   TUBAL LIGATION   1996          Social History:   Tobacco Use History  Social History        Tobacco Use  Smoking Status Some Days   Current packs/day: 0.50   Average packs/day: 0.5 packs/day for 43.0 years (21.5 ttl pk-yrs)   Types: Cigarettes  Smokeless Tobacco Never      Social History        Substance and Sexual Activity  Alcohol Use Yes   Alcohol/week: 5.0 standard drinks of alcohol   Types: 5 Standard drinks or equivalent per week    Comment: occ         Allergies       Allergies  Allergen Reactions   Sulfa Antibiotics Other (See Comments)      UNKNOWN         Medications: Asprin: Yes Statin: No Beta Blocker: Yes Ace Inhibitor: Yes Anti-Coagulation: No         Current Outpatient Medications  Medication Sig Dispense Refill   albuterol  (VENTOLIN  HFA) 108 (90 Base) MCG/ACT inhaler Inhale 1-2 puffs into the lungs every 6 (six) hours as needed for wheezing or shortness of breath. 1 each 0   aspirin  81 MG tablet Take 1 tablet (81 mg total) by mouth daily. 30 tablet 0   chlorthalidone  (HYGROTON ) 25 MG tablet TAKE 1 TABLET(25 MG) BY MOUTH EVERY OTHER DAY 90 tablet 1   enalapril  (VASOTEC ) 20 MG tablet Take 1 tablet (20 mg total) by mouth daily. 90 tablet 1   fluticasone  (FLONASE ) 50 MCG/ACT nasal spray Place 1 spray into both nostrils as needed for allergies or rhinitis.       metoprolol  succinate (TOPROL -XL) 100 MG 24 hr tablet Take 1 tablet (100 mg total) by mouth daily. TAKE WITH OR IMMEDIATELY FOLLOWING A MEAL. 90 tablet 1   Vitamin D , Ergocalciferol , (DRISDOL ) 1.25 MG (50000 UNIT) CAPS capsule Take 1 capsule (50,000 Units total) by mouth every 7 (seven) days. (Patient not taking: Reported on 07/18/2024) 12 capsule 0      No current facility-administered medications for this visit.         (Not in a hospital admission)              Family History  Problem Relation Age of Onset   Heart disease Mother     Stroke Mother     Hypertension Mother     Diabetes Mother     Heart attack Father     Diabetes Sister     Diabetes Sister     Kidney disease Brother     Mental retardation Brother     Colon polyps  Daughter     Cancer Cousin     Breast cancer Cousin          deceased 74   Colon cancer Neg Hx     Esophageal cancer Neg Hx     Rectal cancer Neg Hx     Stomach cancer Neg Hx              Review of Systems:    Review of Systems  Constitutional:  Positive for malaise/fatigue. Negative for chills and fever.  Respiratory:  Positive for shortness of breath. Negative for cough and hemoptysis.   Cardiovascular:  Negative for chest  pain, claudication and leg swelling.  Gastrointestinal:  Negative for nausea and vomiting.  Neurological:  Negative for dizziness and headaches.                  Physical Exam: BP (!) 157/77 (BP Location: Left Arm, Patient Position: Sitting, Cuff Size: Normal) Comment (BP Location): forearm  Pulse 77   Resp 20   Ht 5' 1 (1.549 m)   Wt 217 lb (98.4 kg)   LMP 10/10/1996   SpO2 97% Comment: RA  BMI 41.00 kg/m  Physical Exam Constitutional:      General: She is not in acute distress.    Appearance: Normal appearance.  HENT:     Head: Normocephalic and atraumatic.  Cardiovascular:     Rate and Rhythm: Normal rate and regular rhythm.     Heart sounds: Murmur heard.  Pulmonary:     Effort: Pulmonary effort is normal.     Breath sounds: Normal breath sounds.  Abdominal:     General: Bowel sounds are normal. There is no distension.     Palpations: Abdomen is soft.     Tenderness: There is no abdominal tenderness.  Musculoskeletal:        General: No swelling.  Skin:    General: Skin is warm and dry.     Capillary Refill: Capillary refill takes less than 2 seconds.  Neurological:     General: No focal deficit present.     Mental Status: She is alert and oriented to person, place, and time.   Right mastectomy incision: No drainage, appears healed.  Right chest wall soft tissue is contracted.  Diagnostic Studies & Laboratory data: Cardiac Studies & Procedures Objective  ______________________________________________________________________________________________ CARDIAC CATHETERIZATION   CARDIAC CATHETERIZATION 07/16/2024   Conclusion Table formatting from the original result was not included. Images from the original result were not included.     Prox LAD to Mid LAD lesion is 50% stenosed.   Hemodynamic findings consistent with mild pulmonary hypertension.  PAP-mean 48/16-29 mmHg with PCWP of 22 to 24 mmHg   There is no aortic valve stenosis.-Unable to fully assess for  AI.   There is severe MR by Echo/TEE   In the absence of any other complications or medical issues, we expect the patient to be ready for discharge from a cath perspective on 07/16/2024.   She will proceed with planned T CTS consultation for mitral and aortic valve disease.   Recommend Aspirin  81mg  daily for moderate CAD.   Dominance: Left     Angiographically moderate single-vessel disease with segmental 50% calcified stenosis in the mid LAD from SP1-D2 Mild Pulmonary Hypertension with mean PAP 29 mL of mercury and PCWP of 22 to 24 mmHg -> WHO Class II (due to valvular disease) Due to Valvular Disease, Cardiac Output and Index are mild to moderately reduced.  RECOMMENDATONS   In the absence of any other complications or medical issues, we expect the patient to be ready for discharge from a cath perspective on 07/16/2024.   She will proceed with planned TCTS consultation for mitral and aortic valve disease.   Recommend Aspirin  81mg  daily for moderate CAD.       Alm Clay, MD   Findings Coronary Findings Diagnostic  Dominance: Left   Left Main Vessel was injected. Vessel is moderate in size. Vessel is angiographically normal. Short Left Main   Left Anterior Descending Prox LAD to Mid LAD lesion is 50% stenosed. The lesion is located at the major branch, segmental, eccentric and concentric.   First Diagonal Branch Vessel is small in size.   First Septal Branch Vessel is small in size.   Second Diagonal Branch Vessel is small in size.   Left Circumflex   First Obtuse Marginal Branch Vessel is small in size.   Second Obtuse Marginal Branch Vessel is small in size.   Lateral Third Obtuse Marginal Branch Vessel is small in size.   Left Posterior Descending Artery Vessel is small in size.   First Left Posterolateral Branch Vessel is small in size.   Second Left Posterolateral Branch Vessel is small in size.   Right Coronary Artery Vessel was injected.  Vessel is small. The vessel exhibits minimal luminal irregularities.   Right Ventricular Branch Vessel is small in size.   Intervention   No interventions have been documented.     STRESS TESTS   NM MYOCAR MULTI W/SPECT W 07/01/1996   ECHOCARDIOGRAM   ECHOCARDIOGRAM COMPLETE 05/31/2024   Narrative ECHOCARDIOGRAM REPORT       Patient Name:   Katie Jacobson Date of Exam: 05/31/2024 Medical Rec #:  [id number]        Height:       61.0 in Accession #:    [id number]       Weight:       213.0 lb Date of Birth:  [birth year]       BSA:          1.940 m Patient Age:    67 years         BP:           130/53 mmHg Patient Gender: F                HR:           85 bpm. Exam Location:  [address]   Procedure: 2D Echo, Cardiac Doppler and Color Doppler (Both Spectral and Color Flow Doppler were utilized during procedure).   Indications:    I34.1 MVP   History:        Patient has prior history of Echocardiogram examinations, most recent 05/31/2022. Mitral Valve Prolapse and MR; Risk Factors:Obesity, Current Smoker, Hypertension and Dyslipidemia.   Sonographer:    Elsie Bohr RDCS Referring Phys: [id number] DAVID W HARDING   IMPRESSIONS     1. Moderate to severe mitral regurgitation is now present. No stenosis. Would consider TEE for quantification of MR. The mitral valve is rheumatic. Moderate to severe mitral valve regurgitation. The mean mitral valve gradient is 2.5 mmHg with average heart rate of 63 bpm. 2. Left ventricular ejection fraction, by estimation, is 55 to 60%. The left ventricle has normal function. The left ventricle has no regional wall motion abnormalities. Left ventricular diastolic function could not be evaluated. 3. Right ventricular systolic function is normal. The right ventricular size is mildly  enlarged. There is normal pulmonary artery systolic pressure. The estimated right ventricular systolic pressure is 31.3 mmHg. 4. Left atrial size was severely  dilated. 5. The aortic valve is tricuspid. Aortic valve regurgitation is moderate. No aortic stenosis is present. 6. The inferior vena cava is normal in size with greater than 50% respiratory variability, suggesting right atrial pressure of 3 mmHg.   FINDINGS Left Ventricle: Left ventricular ejection fraction, by estimation, is 55 to 60%. The left ventricle has normal function. The left ventricle has no regional wall motion abnormalities. The left ventricular internal cavity size was normal in size. There is no left ventricular hypertrophy. Left ventricular diastolic function could not be evaluated due to mitral regurgitation (moderate or greater). Left ventricular diastolic function could not be evaluated.   Right Ventricle: The right ventricular size is mildly enlarged. No increase in right ventricular wall thickness. Right ventricular systolic function is normal. There is normal pulmonary artery systolic pressure. The tricuspid regurgitant velocity is 2.66 m/s, and with an assumed right atrial pressure of 3 mmHg, the estimated right ventricular systolic pressure is 31.3 mmHg.   Left Atrium: Left atrial size was severely dilated.   Right Atrium: Right atrial size was normal in size.   Pericardium: There is no evidence of pericardial effusion.   Mitral Valve: Moderate to severe mitral regurgitation is now present. No stenosis. Would consider TEE for quantification of MR. The mitral valve is rheumatic. There is moderate calcification of the anterior mitral valve leaflet(s). Moderate to severe mitral valve regurgitation. The mean mitral valve gradient is 2.5 mmHg with average heart rate of 63 bpm.   Tricuspid Valve: The tricuspid valve is grossly normal. Tricuspid valve regurgitation is mild . No evidence of tricuspid stenosis.   Aortic Valve: The aortic valve is tricuspid. Aortic valve regurgitation is moderate. Aortic regurgitation PHT measures 540 msec. No aortic stenosis is present.    Pulmonic Valve: The pulmonic valve was grossly normal. Pulmonic valve regurgitation is trivial. No evidence of pulmonic stenosis.   Aorta: The aortic root and ascending aorta are structurally normal, with no evidence of dilitation.   Venous: The inferior vena cava is normal in size with greater than 50% respiratory variability, suggesting right atrial pressure of 3 mmHg.   IAS/Shunts: The atrial septum is grossly normal.     LEFT VENTRICLE PLAX 2D LVIDd:         5.20 cm   Diastology LVIDs:         3.30 cm   LV e' medial:    6.56 cm/s LV PW:         0.90 cm   LV E/e' medial:  19.4 LV IVS:        0.90 cm   LV e' lateral:   6.31 cm/s LVOT diam:     1.80 cm   LV E/e' lateral: 20.2 LV SV:         67 LV SV Index:   35 LVOT Area:     2.54 cm     RIGHT VENTRICLE            IVC RVSP:           31.3 mmHg  IVC diam: 1.00 cm   LEFT ATRIUM              Index        RIGHT ATRIUM           Index LA diam:        5.10 cm  2.63 cm/m   RA Pressure: 3.00 mmHg LA Vol (A2C):   76.5 ml  39.43 ml/m  RA Area:     17.50 cm LA Vol (A4C):   126.0 ml 64.95 ml/m  RA Volume:   49.90 ml  25.72 ml/m LA Biplane Vol: 106.0 ml 54.64 ml/m AORTIC VALVE LVOT Vmax:   125.20 cm/s LVOT Vmean:  77.160 cm/s LVOT VTI:    0.264 m AI PHT:      540 msec   AORTA Ao Root diam: 2.50 cm Ao Asc diam:  3.00 cm   MITRAL VALVE                TRICUSPID VALVE MV Area (PHT): 2.39 cm     TR Peak grad:   28.3 mmHg MV Mean grad:  2.5 mmHg     TR Vmax:        266.00 cm/s MV Decel Time: 317 msec     Estimated RAP:  3.00 mmHg MV E velocity: 127.40 cm/s  RVSP:           31.3 mmHg MV A velocity: 117.80 cm/s MV E/A ratio:  1.08         SHUNTS Systemic VTI:  0.26 m Systemic Diam: 1.80 cm   Darryle Decent MD Electronically signed by Darryle Decent MD Signature Date/Time: 05/31/2024/3:18:58 PM       Final   TEE   ECHO TEE 06/21/2024   Narrative TRANSESOPHOGEAL ECHO REPORT       Patient Name:   Katie Jacobson Date  of Exam: 06/21/2024 Medical Rec #:  [id number]        Height:       61.0 in Accession #:    [id number]       Weight:       217.7 lb Date of Birth:  [birth year]       BSA:          1.958 m Patient Age:    67 years         BP:           131/72 mmHg Patient Gender: F                HR:           78 bpm. Exam Location:  Inpatient   Procedure: Transesophageal Echo, Cardiac Doppler, Color Doppler, Saline Contrast Bubble Study, 3D Echo and 2D Echo (Both Spectral and Color Flow Doppler were utilized during procedure).   Indications:     Mitral Regurgitation   History:         Patient has prior history of Echocardiogram examinations, most recent 05/31/2024. Risk Factors:Hypertension, Dyslipidemia and Current Smoker. Breast cancer.   Sonographer:     Thea Norlander RCS Referring Phys:  [id number] DARRYLE NED O'NEAL Diagnosing Phys: Darryle Decent MD   PROCEDURE: After discussion of the risks and benefits of a TEE, an informed consent was obtained from the patient. TEE procedure time was 29 minutes. The transesophogeal probe was passed without difficulty through the esophogus of the patient. Imaged were obtained with the patient in a left lateral decubitus position. Sedation performed by different physician. The patient was monitored while under deep sedation. Anesthestetic sedation was provided intravenously by Anesthesiology: 530.34mg  of Propofol . Image quality was excellent. The patient's vital signs; including heart rate, blood pressure, and oxygen saturation; remained stable throughout the procedure. The patient developed no complications during the procedure.   IMPRESSIONS     1. Rheumatic mitral valve.  The leaflets are severely calcificed. There is severe mitral regurgitation (2D ERO 0.99 cm2, R vol 99 cc). Mild to moderate mitral stenosis with mean gradient 3 mmHG @ 76 bpm. MVA by PHT is 1.80 cm2. MVA by direct 3D assessment 1.60 cm2. There is systolic blunting in all 4 pulmonary veins. The  jet is central and the LA is severely dilated so systolic reversal of flow is unlikely. The mitral valve is rheumatic. Severe mitral valve regurgitation. Mild to moderate mitral stenosis. The mean mitral valve gradient is 3.0 mmHg with average heart rate of 76 bpm. 2. Tricuspid aortic valve with restricted leaflet closure in diastole. Leaflet tips are thickened. 2D ERO 0.23 cm2, R vol 42 cc, RF 49%. There is holodiastolic flow reveral in the descending aorta. This is consistent with severe aortic regurgitation. The aortic valve is tricuspid. Aortic valve regurgitation is severe. Aortic valve area, by VTI measures 2.02 cm. Aortic valve mean gradient measures 7.0 mmHg. Aortic valve Vmax measures 1.96 m/s. 3. Left ventricular ejection fraction, by estimation, is 55 to 60%. The left ventricle has normal function. The left ventricular internal cavity size was mildly dilated. 4. Right ventricular systolic function is normal. The right ventricular size is normal. 5. Left atrial size was severely dilated. No left atrial/left atrial appendage thrombus was detected. The LAA emptying velocity was 27 cm/s. 6. Right atrial size was mildly dilated. 7. Agitated saline contrast bubble study was negative, with no evidence of any interatrial shunt. 8. 3D performed of the mitral valve and 3D performed of the aortic valve and demonstrates 3D assessment of the MV and AoV performed.   FINDINGS Left Ventricle: Left ventricular ejection fraction, by estimation, is 55 to 60%. The left ventricle has normal function. The left ventricular internal cavity size was mildly dilated.   Right Ventricle: The right ventricular size is normal. No increase in right ventricular wall thickness. Right ventricular systolic function is normal.   Left Atrium: Left atrial size was severely dilated. No left atrial/left atrial appendage thrombus was detected. The LAA emptying velocity was 27 cm/s.   Right Atrium: Right atrial size was mildly  dilated.   Pericardium: There is no evidence of pericardial effusion.   Mitral Valve: Rheumatic mitral valve. The leaflets are severely calcificed. There is severe mitral regurgitation (2D ERO 0.99 cm2, R vol 99 cc). Mild to moderate mitral stenosis with mean gradient 3 mmHG @ 76 bpm. MVA by PHT is 1.80 cm2. MVA by direct 3D assessment 1.60 cm2. There is systolic blunting in all 4 pulmonary veins. The jet is central and the LA is severely dilated so systolic reversal of flow is unlikely. The mitral valve is rheumatic. Severe mitral valve regurgitation. Mild to moderate mitral valve stenosis. MV peak gradient, 8.8 mmHg. The mean mitral valve gradient is 3.0 mmHg with average heart rate of 76 bpm.   Tricuspid Valve: The tricuspid valve is grossly normal. Tricuspid valve regurgitation is mild . No evidence of tricuspid stenosis.   Aortic Valve: Tricuspid aortic valve with restricted leaflet closure in diastole. Leaflet tips are thickened. 2D ERO 0.23 cm2, R vol 42 cc, RF 49%. There is holodiastolic flow reveral in the descending aorta. This is consistent with severe aortic regurgitation. The aortic valve is tricuspid. Aortic valve regurgitation is severe. Aortic valve mean gradient measures 7.0 mmHg. Aortic valve peak gradient measures 15.4 mmHg. Aortic valve area, by VTI measures 2.02 cm.   Pulmonic Valve: The pulmonic valve was grossly normal. Pulmonic valve regurgitation is trivial.  No evidence of pulmonic stenosis.   Aorta: The aortic root and ascending aorta are structurally normal, with no evidence of dilitation.   IAS/Shunts: There is right bowing of the interatrial septum, suggestive of elevated left atrial pressure. No atrial level shunt detected by color flow Doppler. Agitated saline contrast was given intravenously to evaluate for intracardiac shunting. Agitated saline contrast bubble study was negative, with no evidence of any interatrial shunt.   Additional Comments: 3D was performed  not requiring image post processing on an independent workstation and was abnormal. Spectral Doppler performed.   LEFT VENTRICLE PLAX 2D LVOT diam:     1.89 cm LV SV:         86 LV SV Index:   44 LVOT Area:     2.81 cm     AORTIC VALVE AV Area (Vmax):    2.00 cm AV Area (Vmean):   1.88 cm AV Area (VTI):     2.02 cm AV Vmax:           196.00 cm/s AV Vmean:          123.000 cm/s AV VTI:            0.425 m AV Peak Grad:      15.4 mmHg AV Mean Grad:      7.0 mmHg LVOT Vmax:         140.00 cm/s LVOT Vmean:        82.600 cm/s LVOT VTI:          0.306 m LVOT/AV VTI ratio: 0.72   AORTA Ao Root diam: 2.79 cm Ao Asc diam:  3.00 cm   MITRAL VALVE              TRICUSPID VALVE MV Area (PHT): 1.80 cm   TR Peak grad:   28.9 mmHg MV Peak grad:  8.8 mmHg   TR Vmax:        269.00 cm/s MV Mean grad:  3.0 mmHg MV Vmax:       1.48 m/s   SHUNTS MV Vmean:      83.3 cm/s  Systemic VTI:  0.31 m Systemic Diam: 1.89 cm   Darryle Decent MD Electronically signed by Darryle Decent MD Signature Date/Time: 06/21/2024/1:34:09 PM       Final           ______________________________________________________________________________________________       EKG: Sinus rhythm with marked sinus arrhythmia    I have independently reviewed the above radiologic studies and discussed with the patient    Recent Lab Findings: Recent Labs       Lab Results  Component Value Date    WBC 6.3 07/01/2024    HGB 12.9 07/16/2024    HCT 38.0 07/16/2024    PLT 221 07/01/2024    GLUCOSE 78 07/01/2024    CHOL 199 09/22/2023    TRIG 64 09/22/2023    HDL 75 09/22/2023    LDLCALC 109 (H) 09/22/2023    ALT 13 09/22/2023    AST 14 09/22/2023    NA 125 (L) 07/16/2024    K 2.8 (L) 07/16/2024    CL 101 07/01/2024    CREATININE 0.84 07/01/2024    BUN 17 07/01/2024    CO2 23 07/01/2024    TSH 0.83 10/15/2019      Assessment / Plan:   68 y.o. female with history of rheumatic valve disease with severe  AI/moderate AS and severe MR/mild-mod MS.    I discussed the general nature  of the procedure, including the need for general anesthesia, the incisions to be used, the use of cardiopulmonary bypass, and the use of temporary pacemaker wires and drainage tubes postoperatively with Ms. Havlin.  We discussed the expected hospital stay, overall recovery and short and long term outcomes. I informed her of the indications, risks, benefits and alternatives.   She understands the risks include, but are not limited to death, stroke, MI, DVT/PE, bleeding, possible need for transfusion, infections, cardiac arrhythmias, as well as other organ system dysfunction including respiratory (eg: prolonged ventilation), renal, or GI complications.   The pros and cons of a biological vs mechanical valve were discussed.  After joint discussion between the patient and myself, we have decided on implantation of biologic valves but I warned her that if her annuli are very small, we may need to place mechanical valves.  Patient is in agreement to proceed with surgery.  Plan: Proceed with AVR/MVR, SHELBA Con Clunes, MD Cardiothoracic Surgery Pager: [phone]

## 2024-08-26 NOTE — Transfer of Care (Signed)
 Immediate Anesthesia Transfer of Care Note  Patient: Katie Jacobson  Procedure(s) Performed: REPLACEMENT, AORTIC VALVE, OPEN, UTILIZING INSPIRIS RESILIA  AORTIC VALVE (Chest) REPLACEMENT, MITRAL VALVE, UTILIZING MITRIS RESILIA MITRAL VALVE (Chest) CLIPPING, LEFT ATRIAL APPENDAGE UTILIZING ATRICURE PRO 40 ECHOCARDIOGRAM, TRANSESOPHAGEAL, INTRAOPERATIVE  Patient Location: ICU  Anesthesia Type:General  Level of Consciousness: sedated and Patient remains intubated per anesthesia plan  Airway & Oxygen Therapy: Patient remains intubated per anesthesia plan and Patient placed on Ventilator (see vital sign flow sheet for setting)  Post-op Assessment: Report given to RN and Post -op Vital signs reviewed and stable  Post vital signs: Reviewed and stable  Last Vitals:  Vitals Value Taken Time  BP    Temp    Pulse    Resp    SpO2      Last Pain:  Vitals:   08/26/24 0628  TempSrc:   PainSc: 0-No pain         Complications: No notable events documented.

## 2024-08-26 NOTE — Anesthesia Procedure Notes (Signed)
 Arterial Line Insertion Start/End11/17/2025 6:55 AM, 08/26/2024 6:57 AM Performed by: Erma Thom SAUNDERS, MD, Mannie Krystal LABOR, CRNA, CRNA  Patient location: Pre-op. Preanesthetic checklist: patient identified, IV checked, site marked, risks and benefits discussed, surgical consent, monitors and equipment checked, pre-op evaluation, timeout performed and anesthesia consent Lidocaine  1% used for infiltration and patient sedated Left, radial was placed Catheter size: 20 G Hand hygiene performed , maximum sterile barriers used  and Seldinger technique used Allen's test indicative of satisfactory collateral circulation Attempts: 1 Procedure performed using ultrasound to evaluate access site. Ultrasound Notes:relevant anatomy identified, ultrasound used to visualize needle entry and vessel patent under ultrasound. Following insertion, Biopatch and dressing applied. Post procedure assessment: normal  Patient tolerated the procedure well with no immediate complications.

## 2024-08-26 NOTE — Anesthesia Procedure Notes (Signed)
 Procedure Name: Intubation Date/Time: 08/26/2024 8:02 AM  Performed by: Mannie Krystal LABOR, CRNAPre-anesthesia Checklist: Patient identified, Emergency Drugs available, Suction available and Patient being monitored Patient Re-evaluated:Patient Re-evaluated prior to induction Oxygen Delivery Method: Circle system utilized Preoxygenation: Pre-oxygenation with 100% oxygen Induction Type: IV induction Ventilation: Oral airway inserted - appropriate to patient size and Two handed mask ventilation required Laryngoscope Size: Miller and 2 Grade View: Grade I Tube type: Oral Tube size: 8.0 mm Number of attempts: 1 Airway Equipment and Method: Stylet and Oral airway Placement Confirmation: ETT inserted through vocal cords under direct vision, positive ETCO2 and breath sounds checked- equal and bilateral Secured at: 22 cm Tube secured with: Tape Dental Injury: Teeth and Oropharynx as per pre-operative assessment

## 2024-08-26 NOTE — Anesthesia Procedure Notes (Signed)
 Central Venous Catheter Insertion Performed by: Erma Thom SAUNDERS, MD, anesthesiologist Start/End11/17/2025 7:00 AM, 08/26/2024 7:15 AM Patient location: Pre-op. Preanesthetic checklist: patient identified, IV checked, site marked, risks and benefits discussed, surgical consent, monitors and equipment checked, pre-op evaluation, timeout performed and anesthesia consent Position: Trendelenburg Lidocaine  1% used for infiltration and patient sedated Hand hygiene performed  and maximum sterile barriers used  Catheter size: 8 Fr Total catheter length 16. Central line was placed.MAC introducer Swan type:thermodilution PA Cath depth:45 Procedure performed using ultrasound to evaluate access site. Ultrasound Notes:relevant anatomy identified, ultrasound used to visualize needle entry, vessel patent under ultrasound and image(s) printed for medical record. Attempts: 1 Following insertion, dressing applied and line sutured. Post procedure assessment: blood return through all ports  Patient tolerated the procedure well with no immediate complications.

## 2024-08-27 ENCOUNTER — Inpatient Hospital Stay (HOSPITAL_COMMUNITY)

## 2024-08-27 ENCOUNTER — Encounter (HOSPITAL_COMMUNITY): Payer: Self-pay

## 2024-08-27 DIAGNOSIS — R918 Other nonspecific abnormal finding of lung field: Secondary | ICD-10-CM | POA: Diagnosis not present

## 2024-08-27 DIAGNOSIS — F419 Anxiety disorder, unspecified: Secondary | ICD-10-CM

## 2024-08-27 DIAGNOSIS — F17210 Nicotine dependence, cigarettes, uncomplicated: Secondary | ICD-10-CM | POA: Diagnosis not present

## 2024-08-27 DIAGNOSIS — Z48812 Encounter for surgical aftercare following surgery on the circulatory system: Secondary | ICD-10-CM | POA: Diagnosis not present

## 2024-08-27 DIAGNOSIS — I351 Nonrheumatic aortic (valve) insufficiency: Secondary | ICD-10-CM | POA: Diagnosis not present

## 2024-08-27 DIAGNOSIS — I517 Cardiomegaly: Secondary | ICD-10-CM | POA: Diagnosis not present

## 2024-08-27 DIAGNOSIS — Z952 Presence of prosthetic heart valve: Secondary | ICD-10-CM | POA: Diagnosis not present

## 2024-08-27 DIAGNOSIS — I5021 Acute systolic (congestive) heart failure: Secondary | ICD-10-CM | POA: Diagnosis not present

## 2024-08-27 DIAGNOSIS — Z4682 Encounter for fitting and adjustment of non-vascular catheter: Secondary | ICD-10-CM | POA: Diagnosis not present

## 2024-08-27 LAB — CBC
HCT: 30.5 % — ABNORMAL LOW (ref 36.0–46.0)
HCT: 27.9 % — ABNORMAL LOW (ref 36.0–46.0)
Hemoglobin: 10.3 g/dL — ABNORMAL LOW (ref 12.0–15.0)
Hemoglobin: 9.4 g/dL — ABNORMAL LOW (ref 12.0–15.0)
MCH: 32.4 pg (ref 26.0–34.0)
MCH: 32.9 pg (ref 26.0–34.0)
MCHC: 33.8 g/dL (ref 30.0–36.0)
MCHC: 33.7 g/dL (ref 30.0–36.0)
MCV: 95.9 fL (ref 80.0–100.0)
MCV: 97.6 fL (ref 80.0–100.0)
Platelets: 125 K/uL — ABNORMAL LOW (ref 150–400)
Platelets: 92 K/uL — ABNORMAL LOW (ref 150–400)
RBC: 3.18 MIL/uL — ABNORMAL LOW (ref 3.87–5.11)
RBC: 2.86 MIL/uL — ABNORMAL LOW (ref 3.87–5.11)
RDW: 16.1 % — ABNORMAL HIGH (ref 11.5–15.5)
RDW: 16.2 % — ABNORMAL HIGH (ref 11.5–15.5)
WBC: 9.9 K/uL (ref 4.0–10.5)
WBC: 12.2 K/uL — ABNORMAL HIGH (ref 4.0–10.5)
nRBC: 0 % (ref 0.0–0.2)
nRBC: 0 % (ref 0.0–0.2)

## 2024-08-27 LAB — COOXEMETRY PANEL
Carboxyhemoglobin: 1.3 % (ref 0.5–1.5)
Carboxyhemoglobin: 1 % (ref 0.5–1.5)
Carboxyhemoglobin: 0.6 % (ref 0.5–1.5)
Methemoglobin: <0.7 % (ref 0.0–1.5)
Methemoglobin: <0.7 % (ref 0.0–1.5)
Methemoglobin: 0.8 % (ref 0.0–1.5)
O2 Saturation: 64 %
O2 Saturation: 61 %
O2 Saturation: 41.9 %
Total hemoglobin: 10.5 g/dL — ABNORMAL LOW (ref 12.0–16.0)
Total hemoglobin: 9.5 g/dL — ABNORMAL LOW (ref 12.0–16.0)
Total hemoglobin: 9.6 g/dL — ABNORMAL LOW (ref 12.0–16.0)

## 2024-08-27 LAB — BPAM PLATELET PHERESIS
Blood Product Expiration Date: 202511182359
Blood Product Expiration Date: 202511202359
ISSUE DATE / TIME: 202511171242
ISSUE DATE / TIME: 202511171421
Unit Type and Rh: 6200
Unit Type and Rh: 7300

## 2024-08-27 LAB — GLUCOSE, CAPILLARY
Glucose-Capillary: 102 mg/dL — ABNORMAL HIGH (ref 70–99)
Glucose-Capillary: 118 mg/dL — ABNORMAL HIGH (ref 70–99)
Glucose-Capillary: 118 mg/dL — ABNORMAL HIGH (ref 70–99)
Glucose-Capillary: 119 mg/dL — ABNORMAL HIGH (ref 70–99)
Glucose-Capillary: 121 mg/dL — ABNORMAL HIGH (ref 70–99)
Glucose-Capillary: 124 mg/dL — ABNORMAL HIGH (ref 70–99)
Glucose-Capillary: 124 mg/dL — ABNORMAL HIGH (ref 70–99)
Glucose-Capillary: 126 mg/dL — ABNORMAL HIGH (ref 70–99)
Glucose-Capillary: 134 mg/dL — ABNORMAL HIGH (ref 70–99)
Glucose-Capillary: 141 mg/dL — ABNORMAL HIGH (ref 70–99)
Glucose-Capillary: 143 mg/dL — ABNORMAL HIGH (ref 70–99)
Glucose-Capillary: 169 mg/dL — ABNORMAL HIGH (ref 70–99)
Glucose-Capillary: 82 mg/dL (ref 70–99)

## 2024-08-27 LAB — POCT I-STAT EG7
Acid-Base Excess: 1 mmol/L (ref 0.0–2.0)
Bicarbonate: 25.9 mmol/L (ref 20.0–28.0)
Calcium, Ion: 1.06 mmol/L — ABNORMAL LOW (ref 1.15–1.40)
HCT: 27 % — ABNORMAL LOW (ref 36.0–46.0)
Hemoglobin: 9.2 g/dL — ABNORMAL LOW (ref 12.0–15.0)
O2 Saturation: 70 %
Patient temperature: 37
Potassium: 3.8 mmol/L (ref 3.5–5.1)
Sodium: 142 mmol/L (ref 135–145)
TCO2: 27 mmol/L (ref 22–32)
pCO2, Ven: 41.6 mmHg — ABNORMAL LOW (ref 44–60)
pH, Ven: 7.402 (ref 7.25–7.43)
pO2, Ven: 36 mmHg (ref 32–45)

## 2024-08-27 LAB — CG4 I-STAT (LACTIC ACID)
Lactic Acid, Venous: 1.6 mmol/L (ref 0.5–1.9)
Lactic Acid, Venous: 1.2 mmol/L (ref 0.5–1.9)

## 2024-08-27 LAB — BASIC METABOLIC PANEL WITH GFR
Anion gap: 12 (ref 5–15)
Anion gap: 8 (ref 5–15)
BUN: 12 mg/dL (ref 8–23)
BUN: 12 mg/dL (ref 8–23)
CO2: 23 mmol/L (ref 22–32)
CO2: 24 mmol/L (ref 22–32)
Calcium: 7.7 mg/dL — ABNORMAL LOW (ref 8.9–10.3)
Calcium: 7.6 mg/dL — ABNORMAL LOW (ref 8.9–10.3)
Chloride: 106 mmol/L (ref 98–111)
Chloride: 107 mmol/L (ref 98–111)
Creatinine, Ser: 0.98 mg/dL (ref 0.44–1.00)
Creatinine, Ser: 0.94 mg/dL (ref 0.44–1.00)
GFR, Estimated: >60 mL/min (ref >60)
GFR, Estimated: >60 mL/min (ref >60)
Glucose, Bld: 124 mg/dL — ABNORMAL HIGH (ref 70–99)
Glucose, Bld: 119 mg/dL — ABNORMAL HIGH (ref 70–99)
Potassium: 3.8 mmol/L (ref 3.5–5.1)
Potassium: 3.9 mmol/L (ref 3.5–5.1)
Sodium: 141 mmol/L (ref 135–145)
Sodium: 139 mmol/L (ref 135–145)

## 2024-08-27 LAB — POCT I-STAT 7, (LYTES, BLD GAS, ICA,H+H)
Acid-Base Excess: 0 mmol/L (ref 0.0–2.0)
Acid-Base Excess: 0 mmol/L (ref 0.0–2.0)
Bicarbonate: 23.8 mmol/L (ref 20.0–28.0)
Bicarbonate: 24.9 mmol/L (ref 20.0–28.0)
Calcium, Ion: 1.06 mmol/L — ABNORMAL LOW (ref 1.15–1.40)
Calcium, Ion: 1.09 mmol/L — ABNORMAL LOW (ref 1.15–1.40)
HCT: 30 % — ABNORMAL LOW (ref 36.0–46.0)
HCT: 30 % — ABNORMAL LOW (ref 36.0–46.0)
Hemoglobin: 10.2 g/dL — ABNORMAL LOW (ref 12.0–15.0)
Hemoglobin: 10.2 g/dL — ABNORMAL LOW (ref 12.0–15.0)
O2 Saturation: 93 %
O2 Saturation: 96 %
Patient temperature: 36.4
Patient temperature: 36.9
Potassium: 3.9 mmol/L (ref 3.5–5.1)
Potassium: 4.3 mmol/L (ref 3.5–5.1)
Sodium: 143 mmol/L (ref 135–145)
Sodium: 144 mmol/L (ref 135–145)
TCO2: 25 mmol/L (ref 22–32)
TCO2: 26 mmol/L (ref 22–32)
pCO2 arterial: 33.6 mmHg (ref 32–48)
pCO2 arterial: 39.7 mmHg (ref 32–48)
pH, Arterial: 7.405 (ref 7.35–7.45)
pH, Arterial: 7.455 — ABNORMAL HIGH (ref 7.35–7.45)
pO2, Arterial: 67 mmHg — ABNORMAL LOW (ref 83–108)
pO2, Arterial: 75 mmHg — ABNORMAL LOW (ref 83–108)

## 2024-08-27 LAB — TYPE AND SCREEN
ABO/RH(D): B POS
Antibody Screen: NEGATIVE

## 2024-08-27 LAB — PREPARE PLATELET PHERESIS
Unit division: 0
Unit division: 0

## 2024-08-27 LAB — TRIGLYCERIDES: Triglycerides: 40 mg/dL (ref <150)

## 2024-08-27 LAB — MAGNESIUM
Magnesium: 2.9 mg/dL — ABNORMAL HIGH (ref 1.7–2.4)
Magnesium: 2.7 mg/dL — ABNORMAL HIGH (ref 1.7–2.4)

## 2024-08-27 MED ORDER — MILRINONE LACTATE IN DEXTROSE 20-5 MG/100ML-% IV SOLN
INTRAVENOUS | Status: AC
  Filled 2024-08-27: qty 100

## 2024-08-27 MED ORDER — MILRINONE LACTATE IN DEXTROSE 20-5 MG/100ML-% IV SOLN
0.2500 ug/kg/min | INTRAVENOUS | Status: DC
  Administered 2024-08-27: 0.125 ug/kg/min via INTRAVENOUS
  Administered 2024-08-28: 0.25 ug/kg/min via INTRAVENOUS
  Administered 2024-08-29 – 2024-09-05 (×19): 0.375 ug/kg/min via INTRAVENOUS
  Administered 2024-09-05: 0.25 ug/kg/min via INTRAVENOUS
  Filled 2024-08-27 (×22): qty 100

## 2024-08-27 MED ORDER — METOPROLOL TARTRATE 12.5 MG HALF TABLET: 12.5000 mg | ORAL_TABLET | Freq: Two times a day (BID) | ORAL | Status: DC

## 2024-08-27 MED ORDER — METOPROLOL TARTRATE 25 MG/10 ML ORAL SUSPENSION: 12.5000 mg | Freq: Two times a day (BID) | ORAL | Status: DC

## 2024-08-27 MED ORDER — INSULIN ASPART 100 UNIT/ML IJ SOLN
0.0000 [IU] | INTRAMUSCULAR | Status: DC
  Administered 2024-08-27: 2 [IU] via SUBCUTANEOUS
  Administered 2024-08-28: 4 [IU] via SUBCUTANEOUS
  Administered 2024-08-28: 2 [IU] via SUBCUTANEOUS
  Administered 2024-08-28 (×2): 4 [IU] via SUBCUTANEOUS
  Administered 2024-08-29 (×3): 2 [IU] via SUBCUTANEOUS
  Administered 2024-08-30: 4 [IU] via SUBCUTANEOUS
  Administered 2024-08-30: 12 [IU] via SUBCUTANEOUS
  Administered 2024-08-30: 4 [IU] via SUBCUTANEOUS
  Administered 2024-08-31: 8 [IU] via SUBCUTANEOUS
  Filled 2024-08-27 (×2): qty 2
  Filled 2024-08-27: qty 4
  Filled 2024-08-27: qty 8
  Filled 2024-08-27 (×4): qty 4
  Filled 2024-08-27: qty 12
  Filled 2024-08-27 (×2): qty 2

## 2024-08-27 NOTE — Progress Notes (Addendum)
 Case and pressor plan d/w cvts  Will incr epi to 2, leave it at 2 overnight Recheck coox in am    Ronnald Gave MSN, AGACNP-BC [facility] Pulmonary/Critical Care Medicine 08/27/2024, 5:51 PM

## 2024-08-27 NOTE — TOC CM/SW Note (Signed)
 Transition of Care [facility]) - Inpatient Brief Assessment   Patient Details  Name: Katie Jacobson MRN: [id number] Date of Birth: 08-30-1956  Transition of Care [facility]) CM/SW Contact:    Sudie Erminio Deems, RN Phone Number: 08/27/2024, 2:39 PM   Clinical Narrative: Patient POD-1 AVR/MVR LAA clipping. PTA patient states she was independent from home with spouse. Spouse was at the bedside during the visit. Patient does not use any DME in the home. Patient has insurance and PCP. No needs identified at this time. [facility] to follow patient with TCTS protocol for [facility] services if needed post discharge. ICM will continue to follow for additional needs.      Transition of Care Asessment: Insurance and Status: Insurance coverage has been reviewed Patient has primary care physician: Yes Home environment has been reviewed: reviewed Prior level of function:: independent Prior/Current Home Services: No current home services Social Drivers of Health Review: SDOH reviewed no interventions necessary Readmission risk has been reviewed: Yes Transition of care needs: no transition of care needs at this time

## 2024-08-27 NOTE — Progress Notes (Signed)
 1 Day Post-Op Procedure(s) (LRB): REPLACEMENT, AORTIC VALVE, OPEN, UTILIZING INSPIRIS RESILIA  AORTIC VALVE (N/A) REPLACEMENT, MITRAL VALVE, UTILIZING MITRIS RESILIA MITRAL VALVE (N/A) CLIPPING, LEFT ATRIAL APPENDAGE UTILIZING ATRICURE PRO 40 (N/A) ECHOCARDIOGRAM, TRANSESOPHAGEAL, INTRAOPERATIVE (N/A) Subjective: Stable overnight, CI on CCO 1.7-1.8 but Fick CI 3.1 Off on levo vs clevidipine  Lactate cleared Following commands this morning  Objective: Vital signs in last 24 hours: BP 101/75 (BP Location: Left Arm)   Pulse 84   Temp (!) 97.5 F (36.4 C)   Resp (!) 22   Ht 5' 1 (1.549 m)   Wt 105 kg   LMP 10/10/1996   SpO2 97%   BMI 43.74 kg/m  Filed Weights   08/26/24 0601 08/27/24 0600  Weight: (P) 97.5 kg 105 kg    Hemodynamic parameters for last 24 hours: PAP: (17-40)/(-9-15) 23/11 CVP:  [4 mmHg-13 mmHg] 7 mmHg CO:  [2.4 L/min-4 L/min] 3.6 L/min CI:  [1.2 L/min/m2-2 L/min/m2] 1.8 L/min/m2  Intake/Output from previous day: 11/17 0701 - 11/18 0700 In: 5558.1 [I.V.:2658.5; Blood:996; NG/GT:30; IV Piggyback:1873.6] Out: 3634 [Urine:2240; Blood:1250; Chest Tube:144] Intake/Output this shift: Total I/O In: 1116.2 [I.V.:710.8; IV Piggyback:405.3] Out: 680 [Urine:580; Chest Tube:100]  Physical Exam: General - Resting comfortably in bed; intubated, sedated CV - DDD paced at 90 Resp - Intubated Abd - Soft, ND/NT Ext - Mild edema  Lab Results:    Latest Ref Rng & Units 08/27/2024    4:42 AM 08/27/2024    4:34 AM 08/26/2024   11:56 PM  CBC  WBC 4.0 - 10.5 K/uL 9.9     Hemoglobin 12.0 - 15.0 g/dL 89.6  89.7  89.7   Hematocrit 36.0 - 46.0 % 30.5  30.0  30.0   Platelets 150 - 400 K/uL 125         Latest Ref Rng & Units 08/27/2024    4:42 AM 08/27/2024    4:34 AM 08/26/2024   11:56 PM  CMP  Glucose 70 - 99 mg/dL 875     BUN 8 - 23 mg/dL 12     Creatinine 9.55 - 1.00 mg/dL 9.01     Sodium 864 - 854 mmol/L 141  144  143   Potassium 3.5 - 5.1 mmol/L  3.8  3.9  4.3   Chloride 98 - 111 mmol/L 106     CO2 22 - 32 mmol/L 23     Calcium  8.9 - 10.3 mg/dL 7.7       CXR: Some bilateral basilar haziness, no pneumothorax  Assessment/Plan: S/P Procedure(s) (LRB): REPLACEMENT, AORTIC VALVE, OPEN, UTILIZING INSPIRIS RESILIA  AORTIC VALVE (N/A) REPLACEMENT, MITRAL VALVE, UTILIZING MITRIS RESILIA MITRAL VALVE (N/A) CLIPPING, LEFT ATRIAL APPENDAGE UTILIZING ATRICURE PRO 40 (N/A) ECHOCARDIOGRAM, TRANSESOPHAGEAL, INTRAOPERATIVE (N/A) POD 1 s/p bioAVR/MVR and LAAL NEURO- intact  Weaning sedation in preparation for extubation CV- DDD paced at 90; underlying appears to be 2nd degree heart block             Keep pacing wires  Wean epi after extubation; continue MAP goals 65-85  Removal femoral A-line prior to extubation RESP- Extubate today             Continue IS, pulm hygiene, ambulation  Keep Chest tubes RENAL- creatinine and lytes Ok  Foley will stay GI- bedside swallow after extubation Endo- BG well controlled Transition to ISS via Endotool ID- NAI DVT ppx - SCD  Dispo: ICU   LOS: 1 day    Con RAMAN Akisha Sturgill 08/27/2024

## 2024-08-27 NOTE — Progress Notes (Signed)
 NAME:  Katie Jacobson, MRN:  [id number], DOB:  [birth year], LOS: 1 ADMISSION DATE:  08/26/2024, CONSULTATION DATE:  08/26/24 REFERRING MD:  Daniel, CHIEF COMPLAINT:  s/p AVR MVR LAA clipping  History of Present Illness:  [name] 2/2 rheumatic valve dz, tobacco use, breast ca s/p R mastectomy and radiation, HTN, HLD  who presented to [facility] 08/26/24 for planned AVR MVR LAA clipping.  Admitted to ICU post op as per pre-op plan PCCM consulted in this setting    Xclamp 232 Total pump 177 EBL 1250 Product 1 Plt 746 cellsaver   Pertinent  Medical History  Rheumatic valve disease Severe AI Severe mitral regurg  HTN HLD Tobacco use  Breast ca s/p R mastectomy, radiation  Significant Hospital Events: Including procedures, antibiotic start and stop dates in addition to other pertinent events   08/26/24 AVR MVR LAA clipping  11/18 extubate  Interim History / Subjective:   Remained intubated overnight  Rcvd morphine  this morning to facilitate fem art line dc   Objective    Blood pressure 101/75, pulse 89, temperature 99 F (37.2 C), resp. rate (!) 30, height 5' 1 (1.549 m), weight 105 kg, last menstrual period 10/10/1996, SpO2 99%. PAP: (21-39)/(8-26) 26/14 CVP:  [4 mmHg-19 mmHg] 10 mmHg CO:  [2.4 L/min-4.1 L/min] 3.6 L/min CI:  [1.2 L/min/m2-2.1 L/min/m2] 1.9 L/min/m2  Vent Mode: CPAP;PSV FiO2 (%):  [40 %-50 %] 40 % Set Rate:  [16 bmp-22 bmp] 22 bmp Vt Set:  [390 mL] 390 mL PEEP:  [5 cmH20] 5 cmH20 Pressure Support:  [8 cmH20-10 cmH20] 8 cmH20 Plateau Pressure:  [16 cmH20-20 cmH20] 20 cmH20   Intake/Output Summary (Last 24 hours) at 08/27/2024 1107 Last data filed at 08/27/2024 1000 Gross per 24 hour  Intake 4531.96 ml  Output 3564 ml  Net 967.96 ml   Filed Weights   08/26/24 0601 08/27/24 0600  Weight: (P) 97.5 kg 105 kg    Examination: General: chronically and critically ill adult F intubated  HENT: NCAT ETT secure R internal  jugular central access Lungs: coarse bilaterally, mechanically ventilated.  Cardiovascular: av paced s1s2  Abdomen:  soft ndnt  Extremities: no acute joint deformity no cyanosis or clubbing  Neuro: awake, anxious, following commands GU: foley  Resolved problem list   Assessment and Plan   Sev MR s/p MVR Sev AI s/p AVR S/p LAA clipping Endotracheally intubated Expected ABLA and thrombocytopenia post op, without clinical significance  Post cardiotomy vasoplegia  Hx HTN Hx HLD Hx tobacco use disorder Hx breast ca s/p R mastectomy and radiation Mixed obstructive and restrictive lung dz (dx on pre-op PFTs) Anxiety  P -post op L/T/D/W per CVTS. Dc fem art line and hopefully swan out later 11/18 -WUA/SBT and extubate -pulm hygiene, early mobility -duoneb  -d/w primary -- will wean epi slowly following the svO2  -to transition off insulin gtt 11/18  -complete ppx abx  -beta blocker when appropriate  -multimodal analgesia  -follow renal indices, UOP  -optimize lytes prn -follow h/h   Labs   CBC: Recent Labs  Lab 08/22/24 1034 08/26/24 0805 08/26/24 1215 08/26/24 1236 08/26/24 1620 08/26/24 1740 08/26/24 2109 08/26/24 2128 08/26/24 2356 08/27/24 0434 08/27/24 0442  WBC 6.7  --   --   --  17.3*  --  12.7*  --   --   --  9.9  HGB 14.1   < > 8.2*   < > 11.6*  11.3*   < >  10.5* 10.2* 10.2* 10.2* 10.3*  HCT 42.7   < > 23.9*   < > 34.0*  33.2*   < > 30.9* 30.0* 30.0* 30.0* 30.5*  MCV 97.5  --   --   --  97.6  --  96.9  --   --   --  95.9  PLT 238  --  100*  --  119*  --  115*  --   --   --  125*   < > = values in this interval not displayed.    Basic Metabolic Panel: Recent Labs  Lab 08/22/24 1034 08/26/24 0805 08/26/24 1236 08/26/24 1334 08/26/24 1337 08/26/24 1446 08/26/24 1601 08/26/24 2109 08/26/24 2128 08/26/24 2356 08/27/24 0434 08/27/24 0442  NA 142   < > 138   < > 139 141   < > 138 143 143 144 141  K 3.9   < > 4.3   < > 3.9 3.9   < > 3.6 3.6 4.3  3.9 3.8  CL 104   < > 101  --  103 104  --  105  --   --   --  106  CO2 27  --   --   --   --   --   --  22  --   --   --  23  GLUCOSE 98   < > 127*  --  130* 175*  --  142*  --   --   --  124*  BUN 15   < > 16  --  15 14  --  14  --   --   --  12  CREATININE 0.88   < > 0.80  --  0.70 0.90  --  1.07*  --   --   --  0.98  CALCIUM 9.3  --   --   --   --   --   --  7.6*  --   --   --  7.7*  MG  --   --   --   --   --   --   --  3.5*  --   --   --  2.9*   < > = values in this interval not displayed.   GFR: Estimated Creatinine Clearance: 63.9 mL/min (by C-G formula based on SCr of 0.98 mg/dL). Recent Labs  Lab 08/22/24 1034 08/26/24 1620 08/26/24 1639 08/26/24 2109 08/26/24 2110 08/26/24 2125 08/27/24 0435 08/27/24 0442 08/27/24 0758  WBC 6.7 17.3*  --  12.7*  --   --   --  9.9  --   LATICACIDVEN  --   --    < >  --  4.3* 3.9* 1.6  --  1.2   < > = values in this interval not displayed.    Liver Function Tests: Recent Labs  Lab 08/22/24 1034  AST 17  ALT 16  ALKPHOS 71  BILITOT 0.7  PROT 7.6  ALBUMIN 3.4*   No results for input(s): LIPASE, AMYLASE in the last 168 hours. No results for input(s): AMMONIA in the last 168 hours.  ABG    Component Value Date/Time   PHART 7.405 08/27/2024 0434   PCO2ART 39.7 08/27/2024 0434   PO2ART 67 (L) 08/27/2024 0434   HCO3 24.9 08/27/2024 0434   TCO2 26 08/27/2024 0434   ACIDBASEDEF 4.0 (H) 08/26/2024 2128   O2SAT 64 08/27/2024 0442     Coagulation Profile: Recent Labs  Lab 08/22/24 1034  08/26/24 1620  INR 1.0 1.8*    Cardiac Enzymes: No results for input(s): CKTOTAL, CKMB, CKMBINDEX, TROPONINI in the last 168 hours.  HbA1C: Hgb A1c MFr Bld  Date/Time Value Ref Range Status  08/22/2024 10:34 AM 5.5 4.8 - 5.6 % Final    Comment:    (NOTE) Diagnosis of Diabetes The following HbA1c ranges recommended by the American Diabetes Association (ADA) may be used as an aid in the diagnosis of diabetes  mellitus.  Hemoglobin             Suggested A1C NGSP%              Diagnosis  <5.7                   Non Diabetic  5.7-6.4                Pre-Diabetic  >6.4                   Diabetic  <7.0                   Glycemic control for                       adults with diabetes.      CBG: Recent Labs  Lab 08/27/24 0611 08/27/24 0653 08/27/24 0755 08/27/24 0914 08/27/24 1010  GLUCAP 102* 143* 141* 82 118*    CRITICAL CARE Performed by: Ronnald FORBES Gave   Total critical care time: 37 minutes  Critical care time was exclusive of separately billable procedures and treating other patients. Critical care was necessary to treat or prevent imminent or life-threatening deterioration.  Critical care was time spent personally by me on the following activities: development of treatment plan with patient and/or surrogate as well as nursing, discussions with consultants, evaluation of patient's response to treatment, examination of patient, obtaining history from patient or surrogate, ordering and performing treatments and interventions, ordering and review of laboratory studies, ordering and review of radiographic studies, pulse oximetry and re-evaluation of patient's condition.  Ronnald Gave MSN, AGACNP-BC [place] Pulmonary/Critical Care Medicine Amion for pager 08/27/2024, 11:07 AM

## 2024-08-27 NOTE — Hospital Course (Addendum)
 Referring: Anner Alm ORN, MD Primary Care: Daryl Setter, NP Primary Cardiologist:David Anner, MD    History of Present Illness:    At time of CT surgical consultation Katie Jacobson is a 68 y.o. female who presents for surgical evaluation of severe AI/moderate AS and severe MR/mild-mod MS in the setting of rheumatic valve disease.  Currently she actually feels fine considering the degree of her valvular disease.  She denies orthopnea but has some shortness of breath but feels her activity is mostly limited by her legs getting tired.  She is continuing to actively smoke, and smokes half pack per day.  She has smoked for 50 years and was previously a heavier smoker.  She does have an alcoholic beverage every once in a while when she gets stressed/anxious.  Denies leg swelling unless she misses her chlorthalidone .   In regards to her breast cancer, she was diagnosed with triple negative right breast cancer in 2016 status post neoadjuvant chemo, right lumpectomy with sentinel lymph node biopsy.  She then completed adjuvant radiation.  In 2024 she was diagnosed with bilateral breast cancer, and underwent left lumpectomy with sentinel lymph node biopsy as well as right mastectomy.  She then completed adjuvant chemotherapy and bilateral chest photon treatment.  She had difficulty healing her right mastectomy incision and had chronic wound issues but it has finally healed.   She is retired -used to work and her job entailed walking and sitting at [employer].  She lives in a house with her husband and has felt very stressed recently because her daughter just moved back in and has 2 big cats and doesn't clean up after herself or the cats.   TEE (06/21/24): Rheumatic mitral valve with severe MR, mild to mod MS.  Severe AI with moderate AS.  Normal BiV function, no PFO. LHC: 50% mid LAD lesion CT chest: No significant aortic calcification PFTs: FEV1 58%, DLCO 76%  Following full review of the  patient and all relevant studies it was Dr. Clent recommendation to proceed with aortic and mitral valve replacement.  She was admitted this hospitalization for the procedure.  Hospital course: Patient was admitted on 08/26/2024 at which time she was taken the operating room and underwent the following procedure:   Procedure:   Median Sternotomy Extracorporeal circulation Femoral venous cannulation 4.   Aortic valve replacement using a 21 mm Inspiris valve 5.   Mitral valve replacement using a 27 mm Mitris valve 6.   Left atrial appendage ligation  She tolerated the procedure well and was taken to the surgical intensive care unit in stable condition.    Postoperative Hospital Course: The patient remained on the ventilator overnight.  She is neurologically intact and sedation is being weaned in preparation for extubation on postop day #1.  This was able to be done without difficulty.  Initial underlying heart rhythm was a second-degree heart block and she was temporarily paced.  She initially did require some inotropic support including epinephrine  and norepinephrine .  The norepinephrine  was weaned quickly and plans were to wean the epinephrine  over the first postop day as able.  She had an expected acute blood loss anemia which was monitored clinically.  Renal function has remained within normal limits.  On postop day 2 she remains on both low dose epinephrine  and milrinone .  She was also noted to have a increased right pleural effusion on CXR but on ultrasound it did not appear large enough to warrant thoracentesis. She continued to require significant  inotropic support on post-op day 3. The advanced heart failure team was asked to evaluate. The echocardiogram was repeated showing normal biventricular function and expected function of the prosthetic aortic and mitral valves. There was moderate to severe tricuspid valve insufficiency. The epinephrine  was weaned off by the early morning of post-op  day  4 but this resulted in a decrease in the coOx to 45%. The epinephrine  was restarted. She was requiring high-flow oxygen to maintain adequate O2 saturations. She was diuresed with a Lasix  drip and metolozone. She was evaluated by physical therapy and occupational therapy and received daily therapy from both teams.  She did develop an ileus that was felt to be related to narcotics and hypokalemia.  She had an NG tube placed due to persistent nausea and vomiting and she was kept NPO until the suspected ileus resolved. The nausea resolved and bowel function returned.  A soft diet was resumed and tolerated. Inotropic support continued and was primarily managed by the advanced heart failure team. She was diuresed aggressively for volume overload.  She developed atrial flutter that was treated with IV amiodarone  and anticoagulation. By post-op day 9, her hemodynamics had stabilized allowing for the inotropic support to be weaned. She was in Atrial Flutter and required Cardioversion which was performed on 11/28 and she converted to NSR for about an hour and reverted back to Atrial Flutter.  She developed complete heart block so the Amiodarone  was held.  EP consult was obtained and they recommended a permanent pacemaker but [name] was reluctant to agree to the procedure. She continued to work on ambulation and progressed to walking 300-400 feet with minimal assistance.

## 2024-08-27 NOTE — Procedures (Signed)
 Extubation Procedure Note  Patient Details:   Name: Katie Jacobson DOB: May 12, 1956 MRN: [id number]   Airway Documentation:    Vent end date: 08/27/24 Vent end time: 1026   Evaluation  O2 sats: stable throughout Complications: No apparent complications Patient did tolerate procedure well. Bilateral Breath Sounds: (P) Clear, Diminished   Extubated per MD order & placed on 4L [place]. Prior to extubation NIF  -24 cmH2O,VC 720 ml. Patient able to speak/cough post extubation. Will continue to monitor.  Tish Eva Lenis 08/27/2024, 10:34 AM

## 2024-08-28 ENCOUNTER — Inpatient Hospital Stay (HOSPITAL_COMMUNITY)

## 2024-08-28 DIAGNOSIS — I5021 Acute systolic (congestive) heart failure: Secondary | ICD-10-CM | POA: Diagnosis not present

## 2024-08-28 DIAGNOSIS — I351 Nonrheumatic aortic (valve) insufficiency: Secondary | ICD-10-CM | POA: Diagnosis not present

## 2024-08-28 DIAGNOSIS — Z952 Presence of prosthetic heart valve: Secondary | ICD-10-CM | POA: Diagnosis not present

## 2024-08-28 DIAGNOSIS — J811 Chronic pulmonary edema: Secondary | ICD-10-CM | POA: Diagnosis not present

## 2024-08-28 DIAGNOSIS — F17210 Nicotine dependence, cigarettes, uncomplicated: Secondary | ICD-10-CM | POA: Diagnosis not present

## 2024-08-28 DIAGNOSIS — R918 Other nonspecific abnormal finding of lung field: Secondary | ICD-10-CM | POA: Diagnosis not present

## 2024-08-28 DIAGNOSIS — Z48812 Encounter for surgical aftercare following surgery on the circulatory system: Secondary | ICD-10-CM | POA: Diagnosis not present

## 2024-08-28 DIAGNOSIS — R0989 Other specified symptoms and signs involving the circulatory and respiratory systems: Secondary | ICD-10-CM | POA: Diagnosis not present

## 2024-08-28 LAB — BASIC METABOLIC PANEL WITH GFR
Anion gap: 11 (ref 5–15)
Anion gap: 12 (ref 5–15)
BUN: 16 mg/dL (ref 8–23)
BUN: 19 mg/dL (ref 8–23)
CO2: 23 mmol/L (ref 22–32)
CO2: 24 mmol/L (ref 22–32)
Calcium: 7.9 mg/dL — ABNORMAL LOW (ref 8.9–10.3)
Calcium: 8 mg/dL — ABNORMAL LOW (ref 8.9–10.3)
Chloride: 105 mmol/L (ref 98–111)
Chloride: 102 mmol/L (ref 98–111)
Creatinine, Ser: 1.29 mg/dL — ABNORMAL HIGH (ref 0.44–1.00)
Creatinine, Ser: 1.23 mg/dL — ABNORMAL HIGH (ref 0.44–1.00)
GFR, Estimated: 46 mL/min — ABNORMAL LOW (ref >60)
GFR, Estimated: 49 mL/min — ABNORMAL LOW (ref >60)
Glucose, Bld: 157 mg/dL — ABNORMAL HIGH (ref 70–99)
Glucose, Bld: 131 mg/dL — ABNORMAL HIGH (ref 70–99)
Potassium: 3.9 mmol/L (ref 3.5–5.1)
Potassium: 3.6 mmol/L (ref 3.5–5.1)
Sodium: 139 mmol/L (ref 135–145)
Sodium: 138 mmol/L (ref 135–145)

## 2024-08-28 LAB — ECHO INTRAOPERATIVE TEE
AR max vel: 1.83 cm2
AV Area VTI: 1.29 cm2
AV Area mean vel: 1.81 cm2
AV Mean grad: 5.3 mmHg
AV Peak grad: 11.7 mmHg
Ao pk vel: 1.71 m/s
Area-P 1/2: 2.93 cm2
Height: 61 in
MV VTI: 1.37 cm2
P 1/2 time: 512 ms
Weight: 3440 [oz_av]

## 2024-08-28 LAB — CBC
HCT: 26.6 % — ABNORMAL LOW (ref 36.0–46.0)
Hemoglobin: 9 g/dL — ABNORMAL LOW (ref 12.0–15.0)
MCH: 33 pg (ref 26.0–34.0)
MCHC: 33.8 g/dL (ref 30.0–36.0)
MCV: 97.4 fL (ref 80.0–100.0)
Platelets: 88 K/uL — ABNORMAL LOW (ref 150–400)
RBC: 2.73 MIL/uL — ABNORMAL LOW (ref 3.87–5.11)
RDW: 16.5 % — ABNORMAL HIGH (ref 11.5–15.5)
WBC: 10.5 K/uL (ref 4.0–10.5)
nRBC: 0 % (ref 0.0–0.2)

## 2024-08-28 LAB — COOXEMETRY PANEL
Carboxyhemoglobin: 1.4 % (ref 0.5–1.5)
Carboxyhemoglobin: 1.7 % — ABNORMAL HIGH (ref 0.5–1.5)
Methemoglobin: <0.7 % (ref 0.0–1.5)
Methemoglobin: <0.7 % (ref 0.0–1.5)
O2 Saturation: 55.1 %
O2 Saturation: 51.3 %
Total hemoglobin: 8.5 g/dL — ABNORMAL LOW (ref 12.0–16.0)
Total hemoglobin: 8.9 g/dL — ABNORMAL LOW (ref 12.0–16.0)

## 2024-08-28 LAB — GLUCOSE, CAPILLARY
Glucose-Capillary: 110 mg/dL — ABNORMAL HIGH (ref 70–99)
Glucose-Capillary: 118 mg/dL — ABNORMAL HIGH (ref 70–99)
Glucose-Capillary: 125 mg/dL — ABNORMAL HIGH (ref 70–99)
Glucose-Capillary: 150 mg/dL — ABNORMAL HIGH (ref 70–99)
Glucose-Capillary: 160 mg/dL — ABNORMAL HIGH (ref 70–99)
Glucose-Capillary: 171 mg/dL — ABNORMAL HIGH (ref 70–99)

## 2024-08-28 LAB — MAGNESIUM: Magnesium: 2.7 mg/dL — ABNORMAL HIGH (ref 1.7–2.4)

## 2024-08-28 MED ORDER — ENSURE PLUS HIGH PROTEIN PO LIQD
237.0000 mL | Freq: Two times a day (BID) | ORAL | Status: DC
  Administered 2024-08-29 – 2024-08-30 (×2): 237 mL via ORAL

## 2024-08-28 MED ORDER — SODIUM CHLORIDE 0.9 % IV SOLN
150.0000 mg | Freq: Once | INTRAVENOUS | Status: AC
  Administered 2024-08-29: 150 mg via INTRAVENOUS
  Filled 2024-08-28: qty 5

## 2024-08-28 MED ORDER — POTASSIUM CHLORIDE 10 MEQ/100ML IV SOLN
10.0000 meq | INTRAVENOUS | Status: AC
  Administered 2024-08-28 – 2024-08-29 (×4): 10 meq via INTRAVENOUS
  Filled 2024-08-28 (×4): qty 100

## 2024-08-28 MED ORDER — FUROSEMIDE 10 MG/ML IJ SOLN
20.0000 mg | Freq: Once | INTRAMUSCULAR | Status: AC
  Administered 2024-08-28: 20 mg via INTRAVENOUS
  Filled 2024-08-28: qty 2

## 2024-08-28 MED ORDER — SODIUM CHLORIDE 0.9 % IV SOLN: 150.0000 mg | Freq: Once | INTRAVENOUS | Status: DC

## 2024-08-28 MED ORDER — EPINEPHRINE HCL 5 MG/250ML IV SOLN IN NS
3.0000 ug/min | INTRAVENOUS | Status: DC
  Administered 2024-08-29: 3 ug/min via INTRAVENOUS
  Filled 2024-08-28: qty 250

## 2024-08-28 MED ORDER — SCOPOLAMINE 1 MG/3DAYS TD PT72
1.0000 | MEDICATED_PATCH | TRANSDERMAL | Status: DC
  Administered 2024-08-28 – 2024-08-31 (×2): 1 mg via TRANSDERMAL
  Filled 2024-08-28 (×2): qty 1

## 2024-08-28 MED ORDER — PROCHLORPERAZINE EDISYLATE 10 MG/2ML IJ SOLN
10.0000 mg | Freq: Once | INTRAMUSCULAR | Status: AC
  Administered 2024-08-28: 10 mg via INTRAVENOUS
  Filled 2024-08-28: qty 2

## 2024-08-28 MED ORDER — HEPARIN SODIUM (PORCINE) 5000 UNIT/ML IJ SOLN
5000.0000 [IU] | Freq: Three times a day (TID) | INTRAMUSCULAR | Status: DC
  Administered 2024-08-28 – 2024-08-30 (×6): 5000 [IU] via SUBCUTANEOUS
  Filled 2024-08-28 (×6): qty 1

## 2024-08-28 MED ORDER — SODIUM CHLORIDE 0.9 % IV SOLN
12.5000 mg | Freq: Four times a day (QID) | INTRAVENOUS | Status: DC | PRN
  Administered 2024-08-28 – 2024-08-31 (×4): 12.5 mg via INTRAVENOUS
  Filled 2024-08-28 (×4): qty 12.5

## 2024-08-28 MED ORDER — PROMETHAZINE (PHENERGAN) 6.25MG IN NS 50ML IVPB
6.2500 mg | Freq: Four times a day (QID) | INTRAVENOUS | Status: DC | PRN
  Administered 2024-08-28: 6.25 mg via INTRAVENOUS
  Filled 2024-08-28: qty 6.25

## 2024-08-28 MED ORDER — MELATONIN 3 MG PO TABS
3.0000 mg | ORAL_TABLET | Freq: Every day | ORAL | Status: DC
  Administered 2024-08-28 – 2024-08-29 (×2): 3 mg via ORAL
  Filled 2024-08-28 (×2): qty 1

## 2024-08-28 MED FILL — Albumin, Human Inj 5%: INTRAVENOUS | Qty: 500 | Status: AC

## 2024-08-28 MED FILL — Calcium Chloride Inj 10%: INTRAVENOUS | Qty: 10 | Status: AC

## 2024-08-28 MED FILL — Sodium Chloride IV Soln 0.9%: INTRAVENOUS | Qty: 2000 | Status: AC

## 2024-08-28 MED FILL — Electrolyte-R (PH 7.4) Solution: INTRAVENOUS | Qty: 5000 | Status: AC

## 2024-08-28 MED FILL — Heparin Sodium (Porcine) Inj 1000 Unit/ML: INTRAMUSCULAR | Qty: 10 | Status: AC

## 2024-08-28 MED FILL — Mannitol IV Soln 20%: INTRAVENOUS | Qty: 500 | Status: AC

## 2024-08-28 MED FILL — Sodium Bicarbonate IV Soln 8.4%: INTRAVENOUS | Qty: 150 | Status: AC

## 2024-08-28 MED FILL — Lidocaine HCl Local Preservative Free (PF) Inj 2%: INTRAMUSCULAR | Qty: 14 | Status: AC

## 2024-08-28 NOTE — Evaluation (Signed)
 Physical Therapy Evaluation Patient Details Name: Katie Jacobson MRN: [id number] DOB: May 18, 1956 Today's Date: 08/28/2024  History of Present Illness  Pt is 68 yo presenting [facility] on 11/17 for scheduled AVR/MVR. PMH: breast cancer (triple negative), rheumatic valve disease, severe AI, sever mitral regurgitation, HTN, HLD  Clinical Impression  Pt is currently presenting at Min A +2 for bed mobility, sit to stand and Min A +3 for gait due to lines/leads. Pt was able to utilize RW for ambulation with stable VS on 3L O2 via [place]. Pt is very motivated and has good support from spouse at home. Pt expected to progress well. Due to pt current functional status, home set up and available assistance at home recommending skilled physical therapy services 3x/week in order to address strength, balance and functional mobility to decrease risk for falls, injury and re-hospitalization.           If plan is discharge home, recommend the following: Assist for transportation;Help with stairs or ramp for entrance;Assistance with cooking/housework;A little help with walking and/or transfers     Equipment Recommendations Rolling walker (2 wheels);BSC/3in1     Functional Status Assessment Patient has had a recent decline in their functional status and demonstrates the ability to make significant improvements in function in a reasonable and predictable amount of time.     Precautions / Restrictions Precautions Precautions: Sternal Precaution Booklet Issued: No Recall of Precautions/Restrictions: Intact Restrictions Weight Bearing Restrictions Per Provider Order: Yes Other Position/Activity Restrictions: sternal precautions      Mobility  Bed Mobility Overal bed mobility: Needs Assistance Bed Mobility: Supine to Sit     Supine to sit: Min assist, HOB elevated, +2 for safety/equipment     General bed mobility comments: Min A to maintain sternal precautions and for trunk to midline     Transfers Overall transfer level: Needs assistance Equipment used: Rolling walker (2 wheels) Transfers: Sit to/from Stand Sit to Stand: +2 safety/equipment, Min assist           General transfer comment: Min A +2 for sit to stand with verbal cues for safe hand placement in order to maintain sternal precautions    Ambulation/Gait Ambulation/Gait assistance: Min assist, +2 safety/equipment Gait Distance (Feet): 120 Feet Assistive device: Rolling walker (2 wheels) Gait Pattern/deviations: Step-through pattern, Decreased stride length Gait velocity: decreased     General Gait Details: +3 with assist with lines/leads and chair follow for safety. 1x seated rest break. poor pleth reading with WB through UE on RW. Min A for assist for balance and navigating AD      Balance Overall balance assessment: Needs assistance Sitting-balance support: Bilateral upper extremity supported, Feet supported Sitting balance-Leahy Scale: Fair Sitting balance - Comments: seated EOB   Standing balance support: Bilateral upper extremity supported, During functional activity, Reliant on assistive device for balance Standing balance-Leahy Scale: Poor Standing balance comment: Min A, no significant LOB       Pertinent Vitals/Pain Pain Assessment Pain Assessment: Faces Faces Pain Scale: Hurts even more Facial Expression: Tense Body Movements: Protection Muscle Tension: Tense, rigid Compliance with ventilator (intubated pts.): N/A Vocalization (extubated pts.): Sighing, moaning CPOT Total: 4 Pain Location: surgical site/sternum Pain Descriptors / Indicators: Aching, Discomfort, Grimacing, Guarding Pain Intervention(s): Monitored during session, Limited activity within patient's tolerance    Home Living Family/patient expects to be discharged to:: Private residence Living Arrangements: Spouse/significant other;Children Available Help at Discharge: Family;Available 24 hours/day Type of Home:  House Home Access: Stairs to enter Entrance Stairs-Rails: None  Entrance Stairs-Number of Steps: 2-3   Home Layout: One level Home Equipment: None      Prior Function Prior Level of Function : Independent/Modified Independent;Driving       Extremity/Trunk Assessment   Upper Extremity Assessment Upper Extremity Assessment: Defer to OT evaluation    Lower Extremity Assessment Lower Extremity Assessment: Overall WFL for tasks assessed;Generalized weakness    Cervical / Trunk Assessment Cervical / Trunk Assessment: Kyphotic (slightly bent due to recnet sternal surgery)  Communication   Communication Communication: No apparent difficulties    Cognition Arousal: Alert Behavior During Therapy: WFL for tasks assessed/performed   PT - Cognitive impairments: No apparent impairments     Following commands: Intact       Cueing Cueing Techniques: Verbal cues     General Comments General comments (skin integrity, edema, etc.): Incision with wound vac intact. Vital signs stable on 3L O2 via [place] during activity. BP per A-line stable throughout        Assessment/Plan    PT Assessment Patient needs continued PT services  PT Problem List Decreased strength;Decreased activity tolerance;Decreased balance;Decreased mobility;Decreased knowledge of precautions;Pain       PT Treatment Interventions DME instruction;Balance training;Gait training;Stair training;Functional mobility training;Patient/family education;Therapeutic activities;Therapeutic exercise    PT Goals (Current goals can be found in the Care Plan section)  Acute Rehab PT Goals Patient Stated Goal: to improve mobility/decrease pain PT Goal Formulation: With patient Time For Goal Achievement: 09/11/24 Potential to Achieve Goals: Good    Frequency Min 2X/week        AM-PAC PT 6 Clicks Mobility  Outcome Measure Help needed turning from your back to your side while in a flat bed without using bedrails?: A  Little Help needed moving from lying on your back to sitting on the side of a flat bed without using bedrails?: A Little Help needed moving to and from a bed to a chair (including a wheelchair)?: A Little Help needed standing up from a chair using your arms (e.g., wheelchair or bedside chair)?: A Lot Help needed to walk in hospital room?: A Lot Help needed climbing 3-5 steps with a railing? : A Lot 6 Click Score: 15    End of Session Equipment Utilized During Treatment: Gait belt Activity Tolerance: Patient tolerated treatment well;Patient limited by pain;Patient limited by fatigue Patient left: in chair;with call bell/phone within reach;with nursing/sitter in room Nurse Communication: Mobility status;Precautions PT Visit Diagnosis: Unsteadiness on feet (R26.81);Other abnormalities of gait and mobility (R26.89);Muscle weakness (generalized) (M62.81);Pain Pain - part of body:  (sternum)    Time: 1511-1540 PT Time Calculation (min) (ACUTE ONLY): 29 min   Charges:   PT Evaluation $PT Eval Low Complexity: 1 Low PT Treatments $Therapeutic Activity: 8-22 mins PT General Charges $$ ACUTE PT VISIT: 1 Visit         Dorothyann Maier, DPT, CLT  Acute Rehabilitation Services Office: [phone] (Secure chat preferred)   Dorothyann VEAR Maier 08/28/2024, 4:35 PM

## 2024-08-28 NOTE — Discharge Instructions (Addendum)
 Discharge Instructions:  1. You may shower, please wash incisions daily with soap and water  and keep dry.  If you wish to cover wounds with dressing you may do so but please keep clean and change daily.  No tub baths or swimming until incisions have completely healed.  If your incisions become red or develop any drainage please call our office at [phone]  2. No Driving until cleared by Dr Linward office and you are no longer using narcotic pain medications  3. Monitor your weight daily.. Please use the same scale and weigh at same time... If you gain 5-10 lbs in 48 hours with associated lower extremity swelling, please contact our office at [phone]  4. Fever of 101.5 for at least 24 hours with no source, please contact our office at [phone]  5. Activity- up as tolerated, please walk at least 3 times per day.  Avoid strenuous activity, no lifting, pushing, or pulling with your arms over 8-10 lbs for a minimum of 6 weeks  6. If any questions or concerns arise, please do not hesitate to contact our office at [phone]  7. Call your primary care provider for a follow up appointment within 2 weeks of hospital discharge.(O'Sullivan, Eleanor, NP [phone])

## 2024-08-28 NOTE — Progress Notes (Signed)
 Gabby, RT notified of patient desatting to 37-81 and sustaining after primary RN verified good pleth on monitor, changed SPO2 sensor and location, and increased O2 via [place] to 6L. Will notify provider if O2 sats don't improve after RT gives nebulizer treatment. 15L O2 via NRB applied to patient until RT arrives at bedside.

## 2024-08-28 NOTE — Progress Notes (Signed)
 2 Days Post-Op Procedure(s) (LRB): REPLACEMENT, AORTIC VALVE, OPEN, UTILIZING INSPIRIS RESILIA  AORTIC VALVE (N/A) REPLACEMENT, MITRAL VALVE, UTILIZING MITRIS RESILIA MITRAL VALVE (N/A) CLIPPING, LEFT ATRIAL APPENDAGE UTILIZING ATRICURE PRO 40 (N/A) ECHOCARDIOGRAM, TRANSESOPHAGEAL, INTRAOPERATIVE (N/A) Subjective: Failed epi wean yesterday, increased to 4 and added milrinone last night Co-ox improved UOP down a little and filling pressures elevated Able to walk to the door this morning, limited by fatigue LBBB on EKG this morning  Objective: Vital signs in last 24 hours: BP 101/75 (BP Location: Left Arm)   Pulse 80   Temp 98.4 F (36.9 C) (Core)   Resp (!) 25   Ht 5' 1 (1.549 m)   Wt 106.1 kg   LMP 10/10/1996   SpO2 92%   BMI 44.21 kg/m  Filed Weights   08/26/24 0601 08/27/24 0600 08/28/24 0650  Weight: (P) 97.5 kg 105 kg 106.1 kg    Hemodynamic parameters for last 24 hours: PAP: (20-42)/(10-32) 23/18 CVP:  [8 mmHg-23 mmHg] 13 mmHg CO:  [2.9 L/min-5.3 L/min] 4.8 L/min CI:  [1.5 L/min/m2-2.7 L/min/m2] 2.4 L/min/m2  Intake/Output from previous day: 11/18 0701 - 11/19 0700 In: 1802.6 [P.O.:720; I.V.:604.8; IV Piggyback:477.8] Out: 1365 [Urine:745; Emesis/NG output:500; Chest Tube:120] Intake/Output this shift: Total I/O In: -  Out: 220 [Urine:220]  Physical Exam: General - Resting comfortably in chair CV - Appears sinus on tele this morning Resp - Unlabored on Letts Abd - Soft, ND/NT Ext - Moderate edema  Lab Results:    Latest Ref Rng & Units 08/28/2024    5:07 AM 08/27/2024    5:36 PM 08/27/2024    1:58 PM  CBC  WBC 4.0 - 10.5 K/uL 10.5  12.2    Hemoglobin 12.0 - 15.0 g/dL 9.0  9.4  9.2   Hematocrit 36.0 - 46.0 % 26.6  27.9  27.0   Platelets 150 - 400 K/uL 88  92        Latest Ref Rng & Units 08/28/2024    5:07 AM 08/27/2024    5:36 PM 08/27/2024    1:58 PM  CMP  Glucose 70 - 99 mg/dL 842  880    BUN 8 - 23 mg/dL 16  12    Creatinine 9.55  - 1.00 mg/dL 8.70  9.05    Sodium 864 - 145 mmol/L 139  139  142   Potassium 3.5 - 5.1 mmol/L 3.9  3.9  3.8   Chloride 98 - 111 mmol/L 105  107    CO2 22 - 32 mmol/L 23  24    Calcium 8.9 - 10.3 mg/dL 7.9  7.6      CXR: Probably increased right pleural effusion, large gastric dilation  Assessment/Plan: S/P Procedure(s) (LRB): REPLACEMENT, AORTIC VALVE, OPEN, UTILIZING INSPIRIS RESILIA  AORTIC VALVE (N/A) REPLACEMENT, MITRAL VALVE, UTILIZING MITRIS RESILIA MITRAL VALVE (N/A) CLIPPING, LEFT ATRIAL APPENDAGE UTILIZING ATRICURE PRO 40 (N/A) ECHOCARDIOGRAM, TRANSESOPHAGEAL, INTRAOPERATIVE (N/A) POD2 s/p bioAVR/MVR and LAAL NEURO- intact  Pain control PRN Deconditioning- PT/OT CV- in SR around 80 bpm, EKG this morning with LBBB             Keep pacing wires to VVI backup  Failed epi wean yesterday, epi at 3 and milrinone at 0.25 this morning. Will keep for now and diurese as able RESP- Possible increased right pleural effusion, will ultrasound to eval for fluid             Continue IS, pulm hygiene, ambulation  Remove mediastinal Chest tubes  RENAL- creatinine bumped, lytes Ok  Weight pre-op 217lbs, 234 lbs this morning             Start IV Lasix  20 x 1 this morning  Foley will stay GI- tolerating regular diet. Gastric dilt this morning but no nausea  BM: none yet Endo- BG well controlled Transition to ACHS ISS ID- [id number] DVT ppx - SCD + HSQ 5000 TID  Dispo: ICU   LOS: 2 days    Con RAMAN Claudett Bayly 08/28/2024

## 2024-08-28 NOTE — Discharge Summary (Signed)
 [address] [place] [address]             [phone]        Physician Discharge Summary  Patient ID: Katie Jacobson MRN: [id number] DOB/AGE: 1956-04-04 68 y.o.  Admit date: 08/26/2024 Discharge date: 09/12/2024  Admission Diagnoses:  Patient Active Problem List   Diagnosis Date Noted   Right ventricular dysfunction 09/03/2024   Hypervolemia 09/03/2024   S/P MVR (mitral valve repair) 08/29/2024   S/P AVR 08/29/2024   Intractable nausea and vomiting 08/29/2024   S/P AVR (aortic valve replacement) 08/26/2024   Severe aortic insufficiency 08/26/2024   Endotracheal tube present 08/26/2024   AKI (acute kidney injury) 01/26/2023   Breast cancer (HCC) 12/15/2022   Bilateral malignant neoplasm of breast in female [facility]) 11/02/2022   Allergic reaction to chemical substance 01/30/2022   Preventative health care 01/28/2022   Cervical radiculopathy 04/20/2021   Primary osteoarthritis of left knee 03/09/2021   Vitamin D  deficiency 07/29/2019   Smoking greater than 40 pack years 07/27/2019   Class 3 severe obesity due to excess calories without serious comorbidity in adult [facility]) 07/26/2019   Genetic testing 01/25/2016   Microscopic hematuria 09/06/2015   Vaginal dryness 12/09/2014   Family history of breast cancer    Malignant neoplasm of upper-outer quadrant of right breast in female, estrogen receptor negative (HCC) 11/14/2014   Intermittent palpitations    Obesity (BMI 35.0-39.9 without comorbidity)    Hyperlipidemia LDL goal <100 07/07/2011   History of partial seizures 04/01/2011   Asthma 04/01/2011   Hot flashes 04/01/2011   Tobacco abuse 04/01/2011   Essential hypertension 04/01/2011   Rheumatic mitral and aortic valve regurgitation 04/01/2011     Discharge Diagnoses:  Patient Active Problem List   Diagnosis Date Noted   Right ventricular dysfunction 09/03/2024   Hypervolemia 09/03/2024   S/P MVR (mitral valve repair) 08/29/2024    S/P AVR 08/29/2024   Intractable nausea and vomiting 08/29/2024   S/P AVR (aortic valve replacement) 08/26/2024   Severe aortic insufficiency 08/26/2024   Endotracheal tube present 08/26/2024   AKI (acute kidney injury) 01/26/2023   Breast cancer (HCC) 12/15/2022   Bilateral malignant neoplasm of breast in female [facility]) 11/02/2022   Allergic reaction to chemical substance 01/30/2022   Preventative health care 01/28/2022   Cervical radiculopathy 04/20/2021   Primary osteoarthritis of left knee 03/09/2021   Vitamin D  deficiency 07/29/2019   Smoking greater than 40 pack years 07/27/2019   Class 3 severe obesity due to excess calories without serious comorbidity in adult [facility]) 07/26/2019   Genetic testing 01/25/2016   Microscopic hematuria 09/06/2015   Vaginal dryness 12/09/2014   Family history of breast cancer    Malignant neoplasm of upper-outer quadrant of right breast in female, estrogen receptor negative (HCC) 11/14/2014   Intermittent palpitations    Obesity (BMI 35.0-39.9 without comorbidity)    Hyperlipidemia LDL goal <100 07/07/2011   History of partial seizures 04/01/2011   Asthma 04/01/2011   Hot flashes 04/01/2011   Tobacco abuse 04/01/2011   Essential hypertension 04/01/2011   Rheumatic mitral and aortic valve regurgitation Atrial fibrillation / atrial flutter Post-op complete heart block Tricuspid valve insufficiency Post-op cardiogenic shock Expected acute blood loss anemia Acute on chronic renal insufficiency 04/01/2011     Discharged Condition: stable   Referring: Anner Alm ORN, MD Primary Care: Daryl Setter, NP Primary Cardiologist:David Anner, MD    History of Present  Illness:    At time of CT surgical consultation [name] is a 68 y.o. female who presents for surgical evaluation of severe AI/moderate AS and severe MR/mild-mod MS in the setting of rheumatic valve disease.  Currently she actually feels fine considering the degree of  her valvular disease.  She denies orthopnea but has some shortness of breath but feels her activity is mostly limited by her legs getting tired.  She is continuing to actively smoke, and smokes half pack per day.  She has smoked for 50 years and was previously a heavier smoker.  She does have an alcoholic beverage every once in a while when she gets stressed/anxious.  Denies leg swelling unless she misses her chlorthalidone .   In regards to her breast cancer, she was diagnosed with triple negative right breast cancer in 2016 status post neoadjuvant chemo, right lumpectomy with sentinel lymph node biopsy.  She then completed adjuvant radiation.  In 2024 she was diagnosed with bilateral breast cancer, and underwent left lumpectomy with sentinel lymph node biopsy as well as right mastectomy.  She then completed adjuvant chemotherapy and bilateral chest photon treatment.  She had difficulty healing her right mastectomy incision and had chronic wound issues but it has finally healed.   She is retired -used to work and her job entailed walking and sitting at [employer].  She lives in a house with her husband and has felt very stressed recently because her daughter just moved back in and has 2 big cats and doesn't clean up after herself or the cats.   TEE (06/21/24): Rheumatic mitral valve with severe MR, mild to mod MS.  Severe AI with moderate AS.  Normal BiV function, no PFO. LHC: 50% mid LAD lesion CT chest: No significant aortic calcification PFTs: FEV1 58%, DLCO 76%  Following full review of the patient and all relevant studies it was Dr. Clent recommendation to proceed with aortic and mitral valve replacement.  She was admitted this hospitalization for the procedure.  Hospital course: Patient was admitted on 08/26/2024 at which time she was taken the operating room and underwent the following procedure:   Procedure:   Median Sternotomy Extracorporeal circulation Femoral venous cannulation 4.   Aortic  valve replacement using a 21 mm Inspiris valve 5.   Mitral valve replacement using a 27 mm Mitris valve 6.   Left atrial appendage ligation  She tolerated the procedure well and was taken to the surgical intensive care unit in stable condition.    Postoperative Hospital Course: The patient remained on the ventilator overnight.  She is neurologically intact and sedation is being weaned in preparation for extubation on postop day #1.  This was able to be done without difficulty.  Initial underlying heart rhythm was a second-degree heart block and she was temporarily paced.  She initially did require some inotropic support including epinephrine  and norepinephrine .  The norepinephrine  was weaned quickly and plans were to wean the epinephrine  over the first postop day as able.  She had an expected acute blood loss anemia which was monitored clinically.  Renal function has remained within normal limits.  On postop day 2 she remains on both low dose epinephrine  and milrinone .  She was also noted to have a increased right pleural effusion on CXR but on ultrasound it did not appear large enough to warrant thoracentesis. She continued to require significant inotropic support on post-op day 3. The advanced heart failure team was asked to evaluate. The echocardiogram was repeated  showing normal biventricular function and expected function of the prosthetic aortic and mitral valves. There was moderate to severe tricuspid valve insufficiency. The epinephrine  was weaned off by the early morning of post-op day  4 but this resulted in a decrease in the coOx to 45%. The epinephrine  was restarted. She was requiring high-flow oxygen to maintain adequate O2 saturations. She was diuresed with a Lasix  drip and metolozone. She was evaluated by physical therapy and occupational therapy and received daily therapy from both teams.  She did develop an ileus that was felt to be related to narcotics and hypokalemia.  She had an NG tube  placed due to persistent nausea and vomiting and she was kept NPO until the suspected ileus resolved. The nausea resolved and bowel function returned.  A soft diet was resumed and tolerated. Inotropic support continued and was primarily managed by the advanced heart failure team. She was diuresed aggressively for volume overload.  She developed atrial flutter that was treated with IV amiodarone  and anticoagulation. By post-op day 9, her hemodynamics had stabilized allowing for the inotropic support to be weaned. She was in Atrial Flutter and required Cardioversion which was performed on 11/28 and she converted to NSR for about an hour and reverted back to Atrial Flutter.  She developed complete heart block so the Amiodarone  was held.  EP consult was obtained and they recommended a permanent pacemaker but Ms. Weigand was reluctant to agree to the procedure initially. The Eliquis  was discontinued and replaced with Heparin  infusion in anticipation of PPM placement. She continued to work on ambulation and progressed to walking 300-400 feet with minimal assistance.  By post-op day 16, she had decided to proceed with having the permanent pacemaker placed. She was teken to the EP lab on 09/11/24 where a dual chamber transvenous permanent pacemaker was placed.  Successful cardioversion was also accomplished.  After this, the Eliquis  and amiodarone  were resumed and she remained in a stable paced rhythm with stable BP.  On 09/12/24, she felt she was ready to return home.  We arranged follow up with cardiology (EP and HF), Dr. Daniel, and advised her to follow up with her PCP, Eleanor Ponto, NP,  within 2 weeks of discharge. We also arranged for home health PT per PT recommendations, along with a rolling walker and a bedside commode.   Consults: pulmonary/intensive care and Cardiology (heart failure and EP)  Significant Diagnostic Studies: EXAM: 2 VIEW(S) XRAY OF THE CHEST 09/09/2024 06:21:38 AM    COMPARISON: 09/04/2024   CLINICAL HISTORY: Status post cardiac surgery [id number]   FINDINGS:   LINES, TUBES AND DEVICES: Stable left PICC line. Cardiac valve replacement and left atrial clip again noted.   LUNGS AND PLEURA: Stable small right pleural effusion with associated right lung base opacity. No pneumothorax.   HEART AND MEDIASTINUM: Stable mild cardiomegaly.   BONES AND SOFT TISSUES: Sternotomy wires again noted.   IMPRESSION: 1. Stable small right pleural effusion. 2. Right lung base opacity which is favored to represent atelectasis in the setting of pleural effusion. 3. Stable mild cardiomegaly.   Electronically signed by: Waddell Calk MD 09/09/2024 07:25 AM EST RP Workstation: [id number]   Treatments:  Surgeon:  Con Daniel, MD   First Assistant:  Dorise Fellers, MD   Experienced assistance was necessary for this case due to surgical complexity. Dr. Fellers assisted with retraction of delicate tissues, exposure, suctioning, and suture management during the entire operation.   Preoperative Diagnosis:  Severe AI, moderate AS, severe MR,  mild-mod MR   Postoperative Diagnosis:  Same   Procedure:   Median Sternotomy Extracorporeal circulation Femoral venous cannulation 4.   Aortic valve replacement using a 21 mm Inspiris valve 5.   Mitral valve replacement using a 27 mm Mitris valve 6.   Left atrial appendage ligation   Anesthesia:  General Endotracheal    Discharge Exam: Blood pressure 106/69, pulse 90, temperature 97.8 F (36.6 C), temperature source Oral, resp. rate 20, height 5' 1 (1.549 m), weight 98.2 kg, last menstrual period 10/10/1996, SpO2 100%.  General appearance: alert, cooperative, and no distress Neurologic: intact         Heart: paced rhythm Lungs: normal work of breathing on RA, breath sounds clear.  Extremities:  Unna boots to both LE's ( changed 12/1) Wound: the sternotomy incision is healing well, there is a dry bulky dressing  over the PPM pocket.   Discharge Medications:  The patient has been discharged on:   1.Beta Blocker:  Yes [   ]                              No   [ x  ]                              If No, reason: post-op complete heart block  2.Ace Inhibitor/ARB: Yes [   ]                                     No  [  x  ]                                     If No, reason: Acute renal impairment  3.Statin:   Yes [   ]                  No  [ x  ]                  If No, reason:  Not indicated  4.Arleene:  Yes  [  x ]                  No   [   ]                  If No, reason:  Patient had ACS upon admission:  No  Plavix/P2Y12 inhibitor: Yes [   ]                                      No  [ x ]     Discharge Instructions     Amb Referral to Cardiac Rehabilitation   Complete by: As directed    Diagnosis: Valve Replacement   Valve:  Aortic Mitral     After initial evaluation and assessments completed: Virtual Based Care may be provided alone or in conjunction with Phase 2 Cardiac Rehab based on patient barriers.: Yes   Intensive Cardiac Rehabilitation (ICR) [facility] location only OR Traditional Cardiac Rehabilitation (TCR) *If criteria for ICR are not met will enroll in TCR ([facility] only): Yes  Allergies as of 09/12/2024       Reactions   Sulfa Antibiotics Other (See Comments)   UNKNOWN        Medication List     STOP taking these medications    chlorthalidone  25 MG tablet Commonly known as: HYGROTON    enalapril  20 MG tablet Commonly known as: VASOTEC    metoprolol  succinate 100 MG 24 hr tablet Commonly known as: TOPROL -XL       TAKE these medications    acetaminophen  325 MG tablet Commonly known as: TYLENOL  Take 2 tablets (650 mg total) by mouth every 6 (six) hours as needed.   albuterol  108 (90 Base) MCG/ACT inhaler Commonly known as: VENTOLIN  HFA Inhale 1-2 puffs into the lungs every 6 (six) hours as needed for wheezing or shortness of breath.   amiodarone  200 MG  tablet Commonly known as: PACERONE  Take 1 tablet (200 mg total) by mouth daily.   apixaban  5 MG Tabs tablet Commonly known as: ELIQUIS  Take 1 tablet (5 mg total) by mouth 2 (two) times daily.   aspirin  81 MG tablet Take 1 tablet (81 mg total) by mouth daily.   fluticasone  50 MCG/ACT nasal spray Commonly known as: FLONASE  Place 1 spray into both nostrils as needed for allergies or rhinitis.   methocarbamol  500 MG tablet Commonly known as: ROBAXIN  Take 1 tablet (500 mg total) by mouth every 8 (eight) hours as needed for muscle spasms.   oxyCODONE  5 MG immediate release tablet Commonly known as: Oxy IR/ROXICODONE  Take 1 tablet (5 mg total) by mouth every 4 (four) hours as needed for up to 7 days for moderate pain (pain score 4-6). For pain not controlled by Tylenol .   spironolactone  25 MG tablet Commonly known as: ALDACTONE  Take 1 tablet (25 mg total) by mouth daily.   torsemide  20 MG tablet Commonly known as: DEMADEX  Take 1 tablet (20 mg total) by mouth every Monday, Wednesday, and Friday. Start taking on: September 13, 2024               Durable Medical Equipment  (From admission, onward)           Start     Ordered   09/10/24 1220  For home use only DME 3 n 1  Once        09/10/24 1219   09/10/24 1220  For home use only DME 4 wheeled rolling walker with seat  Once       Question Answer Comment  Patient needs a walker to treat with the following condition S/P AVR (aortic valve replacement)   Patient needs a walker to treat with the following condition Physical deconditioning      09/10/24 1219            Follow-up Information     Daryl Setter, NP. Schedule an appointment as soon as possible for a visit.   Specialty: Internal Medicine Why: Call for appointment within 2 weeks of hospital discharge. Contact information: [address] [place] [address] [phone]         [facility] [place] [facility]) Follow up.    Specialty: Home Health Services Why: Home Health agency- will call to arrange appointments Contact information: [address] [place]  [phone]        [facility] Dept of [employer] [facility] [employer]. Go on 09/24/2024.   Specialty: Cardiology Why: Your appointment for pacemaker  follow up is at 8:40am. Contact information: [address] [place]  [address] [phone]        Daniel Con RAMAN, MD. Go on 09/26/2024.   Specialty: Cardiothoracic Surgery Why: Your follow up appointment sith Dr. Daniel is at 11:30am. Please arrive about 45 minutes early for a chest x-ray to be performed on the second floor of the same building. Contact information: [address] 4th Floor [place] [address] [phone]         [facility] Follow up.   Specialty: Cardiology Why: 09/19/24 at 10:30 AM  Hospital follow up in the Advanced Heart Failure Clinic at [facility], Entrance C ([facility]) Contact information: [address] [place]  [address] [phone]                Signed:  Laurel JUDITHANN Becket, PA-C  09/12/2024, 9:35 AM

## 2024-08-28 NOTE — Progress Notes (Signed)
 NAME:  Katie Jacobson, MRN:  [id number], DOB:  [birth year], LOS: 2 ADMISSION DATE:  08/26/2024, CONSULTATION DATE:  08/26/24 REFERRING MD:  Daniel, CHIEF COMPLAINT:  s/p AVR MVR LAA clipping  History of Present Illness:  67 yo F PMH sev mitral regurg and severe AI 2/2 rheumatic valve dz, tobacco use, breast ca s/p R mastectomy and radiation, HTN, HLD  who presented to [facility] 08/26/24 for planned AVR MVR LAA clipping.  Admitted to ICU post op as per pre-op plan PCCM consulted in this setting    Xclamp 232 Total pump 177 EBL 1250 Product 1 Plt 746 cellsaver   Pertinent  Medical History  Rheumatic valve disease Severe AI Severe mitral regurg  HTN HLD Tobacco use  Breast ca s/p R mastectomy, radiation  Significant Hospital Events: Including procedures, antibiotic start and stop dates in addition to other pertinent events   08/26/24 AVR MVR LAA clipping  11/18 extubate 11/19 add milrinone , cont epi   Interim History / Subjective:  Milrinone  added overnight after failed epi wean + incr filling pressures   Decr UOP, Cr bump   Short distance ambulation this morning Didn't sleep well overnight   Objective    Blood pressure 101/75, pulse 80, temperature 98.4 F (36.9 C), temperature source Core, resp. rate (!) 25, height 5' 1 (1.549 m), weight 106.1 kg, last menstrual period 10/10/1996, SpO2 92%. PAP: (20-42)/(10-32) 23/18 CVP:  [11 mmHg-23 mmHg] 13 mmHg CO:  [2.9 L/min-5.3 L/min] 4.8 L/min CI:  [1.5 L/min/m2-2.7 L/min/m2] 2.4 L/min/m2  FiO2 (%):  [24 %] 24 %   Intake/Output Summary (Last 24 hours) at 08/28/2024 1204 Last data filed at 08/28/2024 1035 Gross per 24 hour  Intake 1455.77 ml  Output 975 ml  Net 480.77 ml   Filed Weights   08/26/24 0601 08/27/24 0600 08/28/24 0650  Weight: (P) 97.5 kg 105 kg 106.1 kg    Examination: General: chronically and acutely ill F  HENT: NCAT, pink mm  Lungs: Coarse. Shallow respirations. No accessory use, symmetrical chest  expansion  Cardiovascular: s1s2 cap refill < 3 seconds. Chest tube Abdomen:  ndnt  Extremities: some edema. No acute joint deformity  Neuro: awake, following commands  GU: foley   Resolved problem list  Endotracheally intubated  Assessment and Plan    Sev MR s/p MVR Sev AI s/p AVR S/p LAAL LBBB Acute HFrEF  Hx HTN Hx HLD  P -post op, L/T/D/W per cvts. Plan for mediastinal tubes out.  -milrinone  0.25, epi 3. Will order afternoon coox  -SBP goal < 150  -gentle diuresis  -needs to ambulate. PT is also consulted  -start chem vte ppx -multimodal analgesia  Tobacco use Mixed obstructive/restrictive lung dz Hx breast ca s/p R mastectomy, radiation  L pleural effusion  P -pulm hygiene. Edu provided re IS -mobilize -cont bronchodilator  -US  effusion this afternoon following diuresis  AKI P -keep foley, follow renal indices UOP -getting gentle diureses 11/19 -hemodynamic support as able, maintain renal perfusion   Expected ABLA and thrombocytopenia  P -follow CBC, transfuse PRN    Labs   CBC: Recent Labs  Lab 08/26/24 1620 08/26/24 1740 08/26/24 2109 08/26/24 2128 08/27/24 0434 08/27/24 0442 08/27/24 1358 08/27/24 1736 08/28/24 0507  WBC 17.3*  --  12.7*  --   --  9.9  --  12.2* 10.5  HGB 11.6*  11.3*   < > 10.5*   < > 10.2* 10.3* 9.2* 9.4* 9.0*  HCT 34.0*  33.2*   < >  30.9*   < > 30.0* 30.5* 27.0* 27.9* 26.6*  MCV 97.6  --  96.9  --   --  95.9  --  97.6 97.4  PLT 119*  --  115*  --   --  125*  --  92* 88*   < > = values in this interval not displayed.    Basic Metabolic Panel: Recent Labs  Lab 08/22/24 1034 08/26/24 0805 08/26/24 1446 08/26/24 1601 08/26/24 2109 08/26/24 2128 08/27/24 0434 08/27/24 0442 08/27/24 1358 08/27/24 1736 08/28/24 0507  NA 142   < > 141   < > 138   < > 144 141 142 139 139  K 3.9   < > 3.9   < > 3.6   < > 3.9 3.8 3.8 3.9 3.9  CL 104   < > 104  --  105  --   --  106  --  107 105  CO2 27  --   --   --  22  --   --   23  --  24 23  GLUCOSE 98   < > 175*  --  142*  --   --  124*  --  119* 157*  BUN 15   < > 14  --  14  --   --  12  --  12 16  CREATININE 0.88   < > 0.90  --  1.07*  --   --  0.98  --  0.94 1.29*  CALCIUM 9.3  --   --   --  7.6*  --   --  7.7*  --  7.6* 7.9*  MG  --   --   --   --  3.5*  --   --  2.9*  --  2.7* 2.7*   < > = values in this interval not displayed.   GFR: Estimated Creatinine Clearance: 48.8 mL/min (A) (by C-G formula based on SCr of 1.29 mg/dL (H)). Recent Labs  Lab 08/26/24 2109 08/26/24 2110 08/26/24 2125 08/27/24 0435 08/27/24 0442 08/27/24 0758 08/27/24 1736 08/28/24 0507  WBC 12.7*  --   --   --  9.9  --  12.2* 10.5  LATICACIDVEN  --  4.3* 3.9* 1.6  --  1.2  --   --     Liver Function Tests: Recent Labs  Lab 08/22/24 1034  AST 17  ALT 16  ALKPHOS 71  BILITOT 0.7  PROT 7.6  ALBUMIN 3.4*   No results for input(s): LIPASE, AMYLASE in the last 168 hours. No results for input(s): AMMONIA in the last 168 hours.  ABG    Component Value Date/Time   PHART 7.405 08/27/2024 0434   PCO2ART 39.7 08/27/2024 0434   PO2ART 67 (L) 08/27/2024 0434   HCO3 25.9 08/27/2024 1358   TCO2 27 08/27/2024 1358   ACIDBASEDEF 4.0 (H) 08/26/2024 2128   O2SAT 55.1 08/28/2024 0507     Coagulation Profile: Recent Labs  Lab 08/22/24 1034 08/26/24 1620  INR 1.0 1.8*    Cardiac Enzymes: No results for input(s): CKTOTAL, CKMB, CKMBINDEX, TROPONINI in the last 168 hours.  HbA1C: Hgb A1c MFr Bld  Date/Time Value Ref Range Status  08/22/2024 10:34 AM 5.5 4.8 - 5.6 % Final    Comment:    (NOTE) Diagnosis of Diabetes The following HbA1c ranges recommended by the American Diabetes Association (ADA) may be used as an aid in the diagnosis of diabetes mellitus.  Hemoglobin  Suggested A1C NGSP%              Diagnosis  <5.7                   Non Diabetic  5.7-6.4                Pre-Diabetic  >6.4                   Diabetic  <7.0                    Glycemic control for                       adults with diabetes.      CBG: Recent Labs  Lab 08/27/24 1529 08/27/24 2029 08/28/24 0002 08/28/24 0444 08/28/24 0812  GLUCAP 118* 119* 160* 150* 171*    CRITICAL CARE Performed by: Ronnald FORBES Gave   Total critical care time: 36 minutes  Critical care time was exclusive of separately billable procedures and treating other patients. Critical care was necessary to treat or prevent imminent or life-threatening deterioration.  Critical care was time spent personally by me on the following activities: development of treatment plan with patient and/or surrogate as well as nursing, discussions with consultants, evaluation of patient's response to treatment, examination of patient, obtaining history from patient or surrogate, ordering and performing treatments and interventions, ordering and review of laboratory studies, ordering and review of radiographic studies, pulse oximetry and re-evaluation of patient's condition.  Ronnald Gave MSN, AGACNP-BC [place] [facility]Critical [facility] Amion for pager  08/28/2024, 12:04 PM

## 2024-08-29 ENCOUNTER — Inpatient Hospital Stay (HOSPITAL_COMMUNITY)

## 2024-08-29 ENCOUNTER — Other Ambulatory Visit: Payer: Self-pay

## 2024-08-29 DIAGNOSIS — Z48812 Encounter for surgical aftercare following surgery on the circulatory system: Secondary | ICD-10-CM | POA: Diagnosis not present

## 2024-08-29 DIAGNOSIS — I350 Nonrheumatic aortic (valve) stenosis: Secondary | ICD-10-CM

## 2024-08-29 DIAGNOSIS — T8111XA Postprocedural  cardiogenic shock, initial encounter: Secondary | ICD-10-CM | POA: Diagnosis not present

## 2024-08-29 DIAGNOSIS — Z952 Presence of prosthetic heart valve: Secondary | ICD-10-CM

## 2024-08-29 DIAGNOSIS — I352 Nonrheumatic aortic (valve) stenosis with insufficiency: Secondary | ICD-10-CM

## 2024-08-29 DIAGNOSIS — R918 Other nonspecific abnormal finding of lung field: Secondary | ICD-10-CM | POA: Diagnosis not present

## 2024-08-29 DIAGNOSIS — I4892 Unspecified atrial flutter: Secondary | ICD-10-CM | POA: Diagnosis not present

## 2024-08-29 DIAGNOSIS — Z9889 Other specified postprocedural states: Secondary | ICD-10-CM

## 2024-08-29 DIAGNOSIS — R570 Cardiogenic shock: Secondary | ICD-10-CM

## 2024-08-29 DIAGNOSIS — I340 Nonrheumatic mitral (valve) insufficiency: Secondary | ICD-10-CM

## 2024-08-29 DIAGNOSIS — N179 Acute kidney failure, unspecified: Secondary | ICD-10-CM

## 2024-08-29 DIAGNOSIS — J9600 Acute respiratory failure, unspecified whether with hypoxia or hypercapnia: Secondary | ICD-10-CM

## 2024-08-29 DIAGNOSIS — R112 Nausea with vomiting, unspecified: Secondary | ICD-10-CM

## 2024-08-29 DIAGNOSIS — I5021 Acute systolic (congestive) heart failure: Secondary | ICD-10-CM | POA: Diagnosis not present

## 2024-08-29 DIAGNOSIS — J90 Pleural effusion, not elsewhere classified: Secondary | ICD-10-CM | POA: Diagnosis not present

## 2024-08-29 DIAGNOSIS — Z4682 Encounter for fitting and adjustment of non-vascular catheter: Secondary | ICD-10-CM | POA: Diagnosis not present

## 2024-08-29 LAB — POCT I-STAT 7, (LYTES, BLD GAS, ICA,H+H)
Acid-base deficit: 2 mmol/L (ref 0.0–2.0)
Bicarbonate: 22.7 mmol/L (ref 20.0–28.0)
Calcium, Ion: 1.1 mmol/L — ABNORMAL LOW (ref 1.15–1.40)
HCT: 25 % — ABNORMAL LOW (ref 36.0–46.0)
Hemoglobin: 8.5 g/dL — ABNORMAL LOW (ref 12.0–15.0)
O2 Saturation: 83 %
Patient temperature: 98.6
Potassium: 4 mmol/L (ref 3.5–5.1)
Sodium: 136 mmol/L (ref 135–145)
TCO2: 24 mmol/L (ref 22–32)
pCO2 arterial: 35.4 mmHg (ref 32–48)
pH, Arterial: 7.415 (ref 7.35–7.45)
pO2, Arterial: 47 mmHg — ABNORMAL LOW (ref 83–108)

## 2024-08-29 LAB — BASIC METABOLIC PANEL WITH GFR
Anion gap: 15 (ref 5–15)
Anion gap: 14 (ref 5–15)
Anion gap: 14 (ref 5–15)
BUN: 22 mg/dL (ref 8–23)
BUN: 23 mg/dL (ref 8–23)
BUN: 23 mg/dL (ref 8–23)
CO2: 20 mmol/L — ABNORMAL LOW (ref 22–32)
CO2: 20 mmol/L — ABNORMAL LOW (ref 22–32)
CO2: 22 mmol/L (ref 22–32)
Calcium: 7.7 mg/dL — ABNORMAL LOW (ref 8.9–10.3)
Calcium: 7.6 mg/dL — ABNORMAL LOW (ref 8.9–10.3)
Calcium: 7.7 mg/dL — ABNORMAL LOW (ref 8.9–10.3)
Chloride: 100 mmol/L (ref 98–111)
Chloride: 100 mmol/L (ref 98–111)
Chloride: 97 mmol/L — ABNORMAL LOW (ref 98–111)
Creatinine, Ser: 1.49 mg/dL — ABNORMAL HIGH (ref 0.44–1.00)
Creatinine, Ser: 1.38 mg/dL — ABNORMAL HIGH (ref 0.44–1.00)
Creatinine, Ser: 1.45 mg/dL — ABNORMAL HIGH (ref 0.44–1.00)
GFR, Estimated: 39 mL/min — ABNORMAL LOW (ref >60)
GFR, Estimated: 42 mL/min — ABNORMAL LOW (ref >60)
GFR, Estimated: 40 mL/min — ABNORMAL LOW (ref >60)
Glucose, Bld: 124 mg/dL — ABNORMAL HIGH (ref 70–99)
Glucose, Bld: 136 mg/dL — ABNORMAL HIGH (ref 70–99)
Glucose, Bld: 161 mg/dL — ABNORMAL HIGH (ref 70–99)
Potassium: 4 mmol/L (ref 3.5–5.1)
Potassium: 4 mmol/L (ref 3.5–5.1)
Potassium: 3.9 mmol/L (ref 3.5–5.1)
Sodium: 135 mmol/L (ref 135–145)
Sodium: 134 mmol/L — ABNORMAL LOW (ref 135–145)
Sodium: 133 mmol/L — ABNORMAL LOW (ref 135–145)

## 2024-08-29 LAB — COOXEMETRY PANEL
Carboxyhemoglobin: 0.6 % (ref 0.5–1.5)
Carboxyhemoglobin: 1.2 % (ref 0.5–1.5)
Methemoglobin: 1.1 % (ref 0.0–1.5)
Methemoglobin: <0.7 % (ref 0.0–1.5)
O2 Saturation: 96.2 %
O2 Saturation: 56.1 %
Total hemoglobin: 8.8 g/dL — ABNORMAL LOW (ref 12.0–16.0)
Total hemoglobin: <6.9 g/dL — CL (ref 12.0–16.0)

## 2024-08-29 LAB — ECHOCARDIOGRAM LIMITED
AR max vel: 1.35 cm2
AV Area VTI: 1.42 cm2
AV Area mean vel: 1.27 cm2
AV Mean grad: 20 mmHg
AV Peak grad: 40.2 mmHg
Ao pk vel: 3.17 m/s
Calc EF: 63.3 %
Height: 61 in
S' Lateral: 2.9 cm
Single Plane A2C EF: 60.7 %
Single Plane A4C EF: 63.9 %
Weight: 3816.6 [oz_av]

## 2024-08-29 LAB — CBC
HCT: 25 % — ABNORMAL LOW (ref 36.0–46.0)
Hemoglobin: 8.2 g/dL — ABNORMAL LOW (ref 12.0–15.0)
MCH: 32 pg (ref 26.0–34.0)
MCHC: 32.8 g/dL (ref 30.0–36.0)
MCV: 97.7 fL (ref 80.0–100.0)
Platelets: 82 K/uL — ABNORMAL LOW (ref 150–400)
RBC: 2.56 MIL/uL — ABNORMAL LOW (ref 3.87–5.11)
RDW: 16.2 % — ABNORMAL HIGH (ref 11.5–15.5)
WBC: 5.7 K/uL (ref 4.0–10.5)
nRBC: 1.1 % — ABNORMAL HIGH (ref 0.0–0.2)

## 2024-08-29 LAB — GLUCOSE, CAPILLARY
Glucose-Capillary: 116 mg/dL — ABNORMAL HIGH (ref 70–99)
Glucose-Capillary: 117 mg/dL — ABNORMAL HIGH (ref 70–99)
Glucose-Capillary: 121 mg/dL — ABNORMAL HIGH (ref 70–99)
Glucose-Capillary: 122 mg/dL — ABNORMAL HIGH (ref 70–99)
Glucose-Capillary: 134 mg/dL — ABNORMAL HIGH (ref 70–99)
Glucose-Capillary: 178 mg/dL — ABNORMAL HIGH (ref 70–99)

## 2024-08-29 LAB — SURGICAL PATHOLOGY

## 2024-08-29 LAB — MAGNESIUM: Magnesium: 2.4 mg/dL (ref 1.7–2.4)

## 2024-08-29 MED ORDER — AMIODARONE HCL IN DEXTROSE 360-4.14 MG/200ML-% IV SOLN
30.0000 mg/h | INTRAVENOUS | Status: DC
  Administered 2024-08-29 – 2024-09-07 (×17): 30 mg/h via INTRAVENOUS
  Filled 2024-08-29 (×13): qty 200
  Filled 2024-08-29: qty 400
  Filled 2024-08-29 (×4): qty 200

## 2024-08-29 MED ORDER — SODIUM CHLORIDE 0.9 % IV SOLN
3.0000 g | Freq: Four times a day (QID) | INTRAVENOUS | Status: DC
  Administered 2024-08-29 – 2024-08-30 (×3): 3 g via INTRAVENOUS
  Filled 2024-08-29 (×3): qty 8

## 2024-08-29 MED ORDER — FUROSEMIDE 10 MG/ML IJ SOLN
80.0000 mg | Freq: Once | INTRAMUSCULAR | Status: AC
  Administered 2024-08-29: 80 mg via INTRAVENOUS
  Filled 2024-08-29: qty 8

## 2024-08-29 MED ORDER — AMIODARONE HCL IN DEXTROSE 360-4.14 MG/200ML-% IV SOLN: 60.0000 mg/h | INTRAVENOUS | Status: DC

## 2024-08-29 MED ORDER — AMIODARONE HCL IN DEXTROSE 360-4.14 MG/200ML-% IV SOLN
60.0000 mg/h | INTRAVENOUS | Status: AC
  Administered 2024-08-29: 60 mg/h via INTRAVENOUS
  Filled 2024-08-29 (×2): qty 200

## 2024-08-29 MED ORDER — METOLAZONE 2.5 MG PO TABS
2.5000 mg | ORAL_TABLET | Freq: Once | ORAL | Status: AC
  Administered 2024-08-29: 2.5 mg via ORAL
  Filled 2024-08-29: qty 1

## 2024-08-29 MED ORDER — PANTOPRAZOLE SODIUM 40 MG IV SOLR
40.0000 mg | Freq: Once | INTRAVENOUS | Status: AC
  Administered 2024-08-29: 40 mg via INTRAVENOUS
  Filled 2024-08-29: qty 10

## 2024-08-29 MED ORDER — POTASSIUM CHLORIDE 10 MEQ/50ML IV SOLN
10.0000 meq | INTRAVENOUS | Status: AC
  Administered 2024-08-29 (×4): 10 meq via INTRAVENOUS
  Filled 2024-08-29 (×4): qty 50

## 2024-08-29 MED ORDER — AMIODARONE HCL IN DEXTROSE 360-4.14 MG/200ML-% IV SOLN: 30.0000 mg/h | INTRAVENOUS | Status: DC

## 2024-08-29 MED ORDER — SODIUM CHLORIDE 0.9 % IV SOLN: INTRAVENOUS | Status: AC | PRN

## 2024-08-29 MED ORDER — PROCHLORPERAZINE EDISYLATE 10 MG/2ML IJ SOLN
10.0000 mg | Freq: Once | INTRAMUSCULAR | Status: AC
  Administered 2024-08-29: 10 mg via INTRAVENOUS
  Filled 2024-08-29: qty 2

## 2024-08-29 MED ORDER — AMIODARONE LOAD VIA INFUSION: 150.0000 mg | Freq: Once | INTRAVENOUS | Status: DC

## 2024-08-29 MED ORDER — FUROSEMIDE 10 MG/ML IJ SOLN
40.0000 mg | Freq: Once | INTRAMUSCULAR | Status: AC
  Administered 2024-08-29: 40 mg via INTRAVENOUS
  Filled 2024-08-29: qty 4

## 2024-08-29 MED ORDER — FUROSEMIDE 10 MG/ML IJ SOLN
15.0000 mg/h | INTRAVENOUS | Status: DC
  Administered 2024-08-29 – 2024-08-30 (×4): 15 mg/h via INTRAVENOUS
  Filled 2024-08-29: qty 200
  Filled 2024-08-29 (×3): qty 20

## 2024-08-29 NOTE — Progress Notes (Signed)
 eLink Physician-Brief Progress Note Patient Name: JENEFER WOERNER DOB: [birth year] MRN: [id number]   Date of Service  08/29/2024  HPI/Events of Note  Patient with marginal urine output and oxygenation despite 40 mg of Lasix iv, CXR suggest a component of atelectasis which would benefit from BIPAP for positive pressure but her intractable nausea currently precludes it. Abdomen is soft to palpation, not distended, and without tenderness, essentially a normal exam.  eICU Interventions  Will give Compazine  10 mg iv + Protonix 40 mg iv now in hopes of suppressing GI symptoms and permitting a trial of BIPAP, incentive spirometer PRN pending BIPAP.        Kegan Mckeithan U Redith Drach 08/29/2024, 3:21 AM

## 2024-08-29 NOTE — Progress Notes (Signed)
 Physical Therapy Treatment Patient Details Name: Katie Jacobson MRN: [id number] DOB: 18-Apr-1956 Today's Date: 08/29/2024   History of Present Illness Pt is 68 yo presenting to [facility] on 11/17 for scheduled AVR/MVR. PMH: breast cancer (triple negative), rheumatic valve disease, severe AI, sever mitral regurgitation, HTN, HLD    PT Comments  Pt is slowly progressing towards goals. More limited this session due to respiratory status and HHFNC with cord limit for gait and pt fatigue/BP. A-line demonstrating decreased BP during gait with pt reporting nausea/dizziness. Spouse present and supportive. Due to pt current functional status, home set up and available assistance at home recommending skilled physical therapy services 3x/week in order to address strength, balance and functional mobility to decrease risk for falls, injury and re-hospitalization.       If plan is discharge home, recommend the following: Assist for transportation;Help with stairs or ramp for entrance;Assistance with cooking/housework;A little help with walking and/or transfers     Equipment Recommendations  Rolling walker (2 wheels);BSC/3in1       Precautions / Restrictions Precautions Precautions: Sternal Precaution Booklet Issued: No Recall of Precautions/Restrictions: Intact Restrictions Weight Bearing Restrictions Per Provider Order: No Other Position/Activity Restrictions: sternal precautions     Mobility  Bed Mobility Overal bed mobility: Needs Assistance Bed Mobility: Supine to Sit     Supine to sit: Min assist, HOB elevated, +2 for safety/equipment     General bed mobility comments: Min assist for cues to maintain sternal precautions    Transfers Overall transfer level: Needs assistance Equipment used: Rolling walker (2 wheels) Transfers: Sit to/from Stand Sit to Stand: +2 safety/equipment, Contact guard assist           General transfer comment: CGA +2 for safety. Good hand placement with use  of heart pillow. CGA for short distance mobility    Ambulation/Gait Ambulation/Gait assistance: Contact guard assist Gait Distance (Feet): 15 Feet Assistive device: Rolling walker (2 wheels) Gait Pattern/deviations: Step-through pattern, Decreased stride length Gait velocity: decreased Gait velocity interpretation: <1.31 ft/sec, indicative of household ambulator   General Gait Details: +2 for CGA short distance gait; limited by [facility] this session and fatigue. O2 sats down to 84% after short distance gait on 40L 93% O2 HHFNC      Balance Overall balance assessment: Needs assistance Sitting-balance support: Bilateral upper extremity supported, Feet supported Sitting balance-Leahy Scale: Fair Sitting balance - Comments: seated EOB   Standing balance support: Bilateral upper extremity supported, During functional activity, Reliant on assistive device for balance Standing balance-Leahy Scale: Poor Standing balance comment: Benefits from BUE support      Communication Communication Communication: No apparent difficulties  Cognition Arousal: Alert Behavior During Therapy: WFL for tasks assessed/performed   PT - Cognitive impairments: No apparent impairments     Following commands: Intact      Cueing Cueing Techniques: Verbal cues     General Comments General comments (skin integrity, edema, etc.): BP 91/37 after short distance gait. Pt nauseous during gait. BP up to 107/41 in sitting after gait. RN aware and in room.      Pertinent Vitals/Pain Pain Assessment Pain Assessment: Faces Faces Pain Scale: Hurts little more Facial Expression: Tense Muscle Tension: Tense, rigid Compliance with ventilator (intubated pts.): N/A Vocalization (extubated pts.): Sighing, moaning Pain Location: generalized Pain Descriptors / Indicators: Aching, Discomfort Pain Intervention(s): Monitored during session    Home Living Family/patient expects to be discharged to:: Private  residence Living Arrangements: Spouse/significant other;Children Available Help at Discharge: Family;Available 24 hours/day Type of  Home: House Home Access: Stairs to enter Entrance Stairs-Rails: None Entrance Stairs-Number of Steps: 2-3   Home Layout: One level Home Equipment: None          PT Goals (current goals can now be found in the care plan section) Acute Rehab PT Goals Patient Stated Goal: to improve mobility/decrease pain PT Goal Formulation: With patient Time For Goal Achievement: 09/11/24 Potential to Achieve Goals: Good Progress towards PT goals: Progressing toward goals    Frequency    Min 2X/week      PT Plan  Continue with current POC        AM-PAC PT 6 Clicks Mobility   Outcome Measure  Help needed turning from your back to your side while in a flat bed without using bedrails?: A Little Help needed moving from lying on your back to sitting on the side of a flat bed without using bedrails?: A Little Help needed moving to and from a bed to a chair (including a wheelchair)?: A Little Help needed standing up from a chair using your arms (e.g., wheelchair or bedside chair)?: A Lot Help needed to walk in hospital room?: A Lot Help needed climbing 3-5 steps with a railing? : A Lot 6 Click Score: 15    End of Session Equipment Utilized During Treatment: Gait belt Activity Tolerance: Patient tolerated treatment well;Patient limited by pain;Patient limited by fatigue Patient left: in chair;with call bell/phone within reach;with nursing/sitter in room;with family/visitor present Nurse Communication: Mobility status;Precautions PT Visit Diagnosis: Unsteadiness on feet (R26.81);Other abnormalities of gait and mobility (R26.89);Muscle weakness (generalized) (M62.81);Pain Pain - part of body:  (sternum)     Time: 8950-8883 PT Time Calculation (min) (ACUTE ONLY): 27 min  Charges:    $Therapeutic Activity: 8-22 mins PT General Charges $$ ACUTE PT VISIT:  1 Visit                     Dorothyann Maier, DPT, CLT  Acute Rehabilitation Services Office: [phone] (Secure chat preferred)    Dorothyann VEAR Maier 08/29/2024, 4:02 PM

## 2024-08-29 NOTE — Progress Notes (Signed)
  Echocardiogram 2D Echocardiogram has been performed.  Devora Ellouise SAUNDERS 08/29/2024, 9:47 AM

## 2024-08-29 NOTE — Plan of Care (Signed)
  Problem: Education: Goal: Knowledge of General Education information will improve Description: Including pain rating scale, medication(s)/side effects and non-pharmacologic comfort measures Outcome: Progressing   Problem: Health Behavior/Discharge Planning: Goal: Ability to manage health-related needs will improve Outcome: Progressing   Problem: Clinical Measurements: Goal: Ability to maintain clinical measurements within normal limits will improve Outcome: Progressing Goal: Will remain free from infection Outcome: Progressing Goal: Diagnostic test results will improve Outcome: Progressing Goal: Respiratory complications will improve Outcome: Progressing Goal: Cardiovascular complication will be avoided Outcome: Progressing   Problem: Activity: Goal: Risk for activity intolerance will decrease Outcome: Progressing   Problem: Nutrition: Goal: Adequate nutrition will be maintained Outcome: Progressing   Problem: Coping: Goal: Level of anxiety will decrease Outcome: Progressing   Problem: Elimination: Goal: Will not experience complications related to bowel motility Outcome: Progressing Goal: Will not experience complications related to urinary retention Outcome: Progressing   Problem: Pain Managment: Goal: General experience of comfort will improve and/or be controlled Outcome: Progressing   Problem: Safety: Goal: Ability to remain free from injury will improve Outcome: Progressing   Problem: Skin Integrity: Goal: Risk for impaired skin integrity will decrease Outcome: Progressing   Problem: Activity: Goal: Ability to tolerate increased activity will improve Outcome: Progressing   Problem: Respiratory: Goal: Ability to maintain a clear airway and adequate ventilation will improve Outcome: Progressing   Problem: Role Relationship: Goal: Method of communication will improve Outcome: Progressing   Problem: Education: Goal: Will demonstrate proper wound  care and an understanding of methods to prevent future damage Outcome: Progressing Goal: Knowledge of disease or condition will improve Outcome: Progressing Goal: Knowledge of the prescribed therapeutic regimen will improve Outcome: Progressing Goal: Individualized Educational Video(s) Outcome: Progressing   Problem: Activity: Goal: Risk for activity intolerance will decrease Outcome: Progressing   Problem: Cardiac: Goal: Will achieve and/or maintain hemodynamic stability Outcome: Progressing   Problem: Clinical Measurements: Goal: Postoperative complications will be avoided or minimized Outcome: Progressing   Problem: Respiratory: Goal: Respiratory status will improve Outcome: Progressing   Problem: Skin Integrity: Goal: Wound healing without signs and symptoms of infection Outcome: Progressing Goal: Risk for impaired skin integrity will decrease Outcome: Progressing   Problem: Urinary Elimination: Goal: Ability to achieve and maintain adequate renal perfusion and functioning will improve Outcome: Progressing   Problem: Education: Goal: Will demonstrate proper wound care and an understanding of methods to prevent future damage Outcome: Progressing Goal: Knowledge of disease or condition will improve Outcome: Progressing Goal: Knowledge of the prescribed therapeutic regimen will improve Outcome: Progressing Goal: Individualized Educational Video(s) Outcome: Progressing   Problem: Activity: Goal: Risk for activity intolerance will decrease Outcome: Progressing   Problem: Cardiac: Goal: Will achieve and/or maintain hemodynamic stability Outcome: Progressing   Problem: Clinical Measurements: Goal: Postoperative complications will be avoided or minimized Outcome: Progressing   Problem: Respiratory: Goal: Respiratory status will improve Outcome: Progressing   Problem: Skin Integrity: Goal: Wound healing without signs and symptoms of infection Outcome:  Progressing Goal: Risk for impaired skin integrity will decrease Outcome: Progressing   Problem: Urinary Elimination: Goal: Ability to achieve and maintain adequate renal perfusion and functioning will improve Outcome: Progressing

## 2024-08-29 NOTE — Progress Notes (Addendum)
 NAME:  Katie Jacobson, MRN:  [id number], DOB:  [birth year], LOS: 3 ADMISSION DATE:  08/26/2024, CONSULTATION DATE:  08/26/24 REFERRING MD:  Daniel, CHIEF COMPLAINT:  s/p AVR MVR LAA clipping  History of Present Illness:  [name] 2/2 rheumatic valve dz, tobacco use, breast ca s/p R mastectomy and radiation, HTN, HLD  who presented to [facility] 08/26/24 for planned AVR MVR LAA clipping.  Admitted to ICU post op as per pre-op plan PCCM consulted in this setting    Xclamp 232 Total pump 177 EBL 1250 Product 1 Plt 746 cellsaver   Pertinent  Medical History  Rheumatic valve disease Severe AI Severe mitral regurg  HTN HLD Tobacco use  Breast ca s/p R mastectomy, radiation  Significant Hospital Events: Including procedures, antibiotic start and stop dates in addition to other pertinent events   08/26/24 AVR MVR LAA clipping  11/18 extubate 11/19 add milrinone, cont epi   Interim History / Subjective:  Increased O2 needs overnight  Persistent nausea, some improvement with emend  UOP starting to pick up on lasix gtt  Remains on milrinone, epi gtts   Objective    Blood pressure 101/69, pulse 82, temperature 99.5 F (37.5 C), resp. rate (!) 24, height 5' 1 (1.549 m), weight 108.2 kg, last menstrual period 10/10/1996, SpO2 97%. PAP: (13-44)/(10-27) 25/18 CVP:  [8 mmHg-78 mmHg] 18 mmHg CO:  [3.9 L/min-6 L/min] 4.4 L/min CI:  [2 L/min/m2-3 L/min/m2] 2.2 L/min/m2  FiO2 (%):  [80 %-100 %] 90 %   Intake/Output Summary (Last 24 hours) at 08/29/2024 1113 Last data filed at 08/29/2024 0900 Gross per 24 hour  Intake 1291.7 ml  Output 960 ml  Net 331.7 ml   Filed Weights   08/27/24 0600 08/28/24 0650 08/29/24 0500  Weight: 105 kg 106.1 kg 108.2 kg    Examination: General:  acute on chronically ill appearing female, NAD HEENT: MM pink/moist Neuro: awake, alert, appropriate  CV: s1s2 rrr, no m/r/g PULM:  resps even non labored on HHFNC, few rhonchi,  bibasilar crackles, chest tube GI: soft, bsx4 active  Extremities: warm/dry, no sig edema  Skin: no rashes or lesions   Resolved problem list  Endotracheally intubated  Assessment and Plan   Severe MR with mod stenosis and severe AI s/p MVR, AVR and LAA clip 11/17 LBBB Acute HFrEF  Hx HTN Hx HLD  P - post op care per TCTS  - heart failure now following  - lasix gtt  - repeat echo pending - bedside read by Dr Rolan with some concern for severe TR  -PT/OT, mobilize  -multimodal analgesia - ASA, statin  - monitor co-ox, CVP  - holding home anti-HTN   Acute hypoxic respiratory failure - r/t above +/- aspiration in setting intractable nausea and vomiting  P:  - titrate supplemental O2 as able  - mobilize  - diuresis as above  - empiric unasyn for now  Intractable nausea and vomiting  KUB ok  P:  PRN compazine , phenergan   Slight improvement with emend dose  Scop patch  Mobilize as above  Clear liquids as tol   Hyperglycemia  A1c= 5.5 P:  - SSI   Tobacco use Mixed obstructive/restrictive lung dz Hx breast ca s/p R mastectomy, radiation  L pleural effusion  P -pulm hygiene. Edu provided re IS -mobilize -cont bronchodilator  -US  effusion this afternoon following diuresis  AKI P -keep foley, follow renal indices UOP -diuresis as above  -hemodynamic support  as able, maintain renal perfusion   Expected ABLA and thrombocytopenia  P -follow CBC, transfuse PRN    Labs   CBC: Recent Labs  Lab 08/26/24 2109 08/26/24 2128 08/27/24 0442 08/27/24 1358 08/27/24 1736 08/28/24 0507 08/29/24 0105 08/29/24 0519  WBC 12.7*  --  9.9  --  12.2* 10.5  --  5.7  HGB 10.5*   < > 10.3* 9.2* 9.4* 9.0* 8.5* 8.2*  HCT 30.9*   < > 30.5* 27.0* 27.9* 26.6* 25.0* 25.0*  MCV 96.9  --  95.9  --  97.6 97.4  --  97.7  PLT 115*  --  125*  --  92* 88*  --  82*   < > = values in this interval not displayed.    Basic Metabolic Panel: Recent Labs  Lab 08/26/24 2109  08/26/24 2128 08/27/24 0442 08/27/24 1358 08/27/24 1736 08/28/24 0507 08/28/24 1728 08/29/24 0105 08/29/24 0519  NA 138   < > 141   < > 139 139 138 136 135  K 3.6   < > 3.8   < > 3.9 3.9 3.6 4.0 4.0  CL 105  --  106  --  107 105 102  --  100  CO2 22  --  23  --  24 23 24   --  20*  GLUCOSE 142*  --  124*  --  119* 157* 131*  --  124*  BUN 14  --  12  --  12 16 19   --  22  CREATININE 1.07*  --  0.98  --  0.94 1.29* 1.23*  --  1.49*  CALCIUM  7.6*  --  7.7*  --  7.6* 7.9* 8.0*  --  7.7*  MG 3.5*  --  2.9*  --  2.7* 2.7*  --   --  2.4   < > = values in this interval not displayed.   GFR: Estimated Creatinine Clearance: 42.8 mL/min (A) (by C-G formula based on SCr of 1.49 mg/dL (H)). Recent Labs  Lab 08/26/24 2110 08/26/24 2125 08/27/24 0435 08/27/24 0442 08/27/24 0758 08/27/24 1736 08/28/24 0507 08/29/24 0519  WBC  --   --   --  9.9  --  12.2* 10.5 5.7  LATICACIDVEN 4.3* 3.9* 1.6  --  1.2  --   --   --     Liver Function Tests: No results for input(s): AST, ALT, ALKPHOS, BILITOT, PROT, ALBUMIN  in the last 168 hours.  No results for input(s): LIPASE, AMYLASE in the last 168 hours. No results for input(s): AMMONIA in the last 168 hours.  ABG    Component Value Date/Time   PHART 7.415 08/29/2024 0105   PCO2ART 35.4 08/29/2024 0105   PO2ART 47 (L) 08/29/2024 0105   HCO3 22.7 08/29/2024 0105   TCO2 24 08/29/2024 0105   ACIDBASEDEF 2.0 08/29/2024 0105   O2SAT 56.1 08/29/2024 0628     Coagulation Profile: Recent Labs  Lab 08/26/24 1620  INR 1.8*    Cardiac Enzymes: No results for input(s): CKTOTAL, CKMB, CKMBINDEX, TROPONINI in the last 168 hours.  HbA1C: Hgb A1c MFr Bld  Date/Time Value Ref Range Status  08/22/2024 10:34 AM 5.5 4.8 - 5.6 % Final    Comment:    (NOTE) Diagnosis of Diabetes The following HbA1c ranges recommended by the American Diabetes Association (ADA) may be used as an aid in the diagnosis of diabetes  mellitus.  Hemoglobin             Suggested A1C NGSP%  Diagnosis  <5.7                   Non Diabetic  5.7-6.4                Pre-Diabetic  >6.4                   Diabetic  <7.0                   Glycemic control for                       adults with diabetes.      CBG: Recent Labs  Lab 08/28/24 1608 08/28/24 2033 08/29/24 0022 08/29/24 0523 08/29/24 0843  GLUCAP 118* 110* 121* 117* 116*    CRITICAL CARE Performed by: Lamarr Myers   Total critical care time: 32 minutes  Critical care time was exclusive of separately billable procedures and treating other patients. Critical care was necessary to treat or prevent imminent or life-threatening deterioration.  Critical care was time spent personally by me on the following activities: development of treatment plan with patient and/or surrogate as well as nursing, discussions with consultants, evaluation of patient's response to treatment, examination of patient, obtaining history from patient or surrogate, ordering and performing treatments and interventions, ordering and review of laboratory studies, ordering and review of radiographic studies, pulse oximetry and re-evaluation of patient's condition.  Rockie Myers, NP Pulmonary/Critical Care Medicine  08/29/2024  11:13 AM   See Tracey for personal pager PCCM on call pager [phone] until 7pm. Please call Elink 7p-7a. [phone]

## 2024-08-29 NOTE — Progress Notes (Signed)
 3 Days Post-Op Procedure(s) (LRB): REPLACEMENT, AORTIC VALVE, OPEN, UTILIZING INSPIRIS RESILIA  AORTIC VALVE (N/A) REPLACEMENT, MITRAL VALVE, UTILIZING MITRIS RESILIA MITRAL VALVE (N/A) CLIPPING, LEFT ATRIAL APPENDAGE UTILIZING ATRICURE PRO 40 (N/A) ECHOCARDIOGRAM, TRANSESOPHAGEAL, INTRAOPERATIVE (N/A) Subjective: Increased oxygen requirement overnight, now on HHFNC Nausea improved Urine output not responsive to lasix 40 IV overnight  Objective: Vital signs in last 24 hours: BP 111/69   Pulse 85   Temp 99 F (37.2 C)   Resp (!) 21   Ht 5' 1 (1.549 m)   Wt 108.2 kg   LMP 10/10/1996   SpO2 94%   BMI 45.07 kg/m  Filed Weights   08/27/24 0600 08/28/24 0650 08/29/24 0500  Weight: 105 kg 106.1 kg 108.2 kg    Hemodynamic parameters for last 24 hours: PAP: (13-44)/(10-27) 31/16 CVP:  [8 mmHg-78 mmHg] 19 mmHg CO:  [3.8 L/min-6 L/min] 4.2 L/min CI:  [1.9 L/min/m2-3 L/min/m2] 2.2 L/min/m2  Intake/Output from previous day: 11/19 0701 - 11/20 0700 In: 1245.6 [I.V.:445.7; IV Piggyback:799.9] Out: 1075 [Urine:1045; Chest Tube:30] Intake/Output this shift: No intake/output data recorded.  Physical Exam: General - Resting comfortably in bed CV - RRR Resp - Unlabored on HHFNC Abd - Soft, ND/NT Ext - Moderate extremity edema  Lab Results:    Latest Ref Rng & Units 08/29/2024    5:19 AM 08/29/2024    1:05 AM 08/28/2024    5:07 AM  CBC  WBC 4.0 - 10.5 K/uL 5.7   10.5   Hemoglobin 12.0 - 15.0 g/dL 8.2  8.5  9.0   Hematocrit 36.0 - 46.0 % 25.0  25.0  26.6   Platelets 150 - 400 K/uL 82   88       Latest Ref Rng & Units 08/29/2024    5:19 AM 08/29/2024    1:05 AM 08/28/2024    5:28 PM  CMP  Glucose 70 - 99 mg/dL 875   868   BUN 8 - 23 mg/dL 22   19   Creatinine 9.55 - 1.00 mg/dL 8.50   8.76   Sodium 864 - 145 mmol/L 135  136  138   Potassium 3.5 - 5.1 mmol/L 4.0  4.0  3.6   Chloride 98 - 111 mmol/L 100   102   CO2 22 - 32 mmol/L 20   24   Calcium 8.9 - 10.3  mg/dL 7.7   8.0     CXR: Pending  Assessment/Plan: S/P Procedure(s) (LRB): REPLACEMENT, AORTIC VALVE, OPEN, UTILIZING INSPIRIS RESILIA  AORTIC VALVE (N/A) REPLACEMENT, MITRAL VALVE, UTILIZING MITRIS RESILIA MITRAL VALVE (N/A) CLIPPING, LEFT ATRIAL APPENDAGE UTILIZING ATRICURE PRO 40 (N/A) ECHOCARDIOGRAM, TRANSESOPHAGEAL, INTRAOPERATIVE (N/A) POD3 s/p bioAVR/MVR LAAL NEURO- intact  Pain control PRN Deconditioning- PT/OT CV- in SR around 80 bpm, may be Aflutter, will repeat EKG             Cap pacing wires  Still requiring significant inotropy, will ask HF team to evaluate RESP- Increased oxygen requirement likely due to volume overload, underlying lung disease, poor pulmonary toilet  Aggressive diuresis today, will wean oxygen as tolerated             Continue IS, pulm hygiene, ambulation RENAL- creatinine bumped but lytes Ok  Weight continues to increase             Start IV 80 Lasix + gtt  Foley will stay for now GI- Nausea improved, continue clears for now  BM: None since surgery Endo- BG well controlled,  not taking much PO, will continue q4 checks for now ID- NAI DVT ppx - SCD + HSQ 5000 TID  Dispo: ICU   LOS: 3 days    Con RAMAN Juline Sanderford 08/29/2024

## 2024-08-29 NOTE — Progress Notes (Signed)
 CXR for PICC placement shows that the PICC flips up at the point that it comes in contact with the right internal jugular introducer.  Patient had been placed in upright position and power flushed all ports prior to CXR.  Discussed with bedside nurse, Robin.  Agreeable to have see if line will flip down through the night and evaluate with the morning CXR.  PICC team will follow up once services resume in the morning.  Right arm unable to be used has restricted extremity due to mastectomy.

## 2024-08-29 NOTE — Consult Note (Signed)
 Advanced Heart Failure Team Consult Note   Primary Physician: Daryl Setter, NP Cardiologist:  Alm Clay, MD  Reason for Consultation: post-op CHF/RV failure  HPI:    Katie Jacobson is seen today for evaluation of post-op CHF/RV failure at the request of Dr. Daniel with [facility]. 68 y.o. 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   She underwent planned AVR with 21 mm Inspiris valve, MVR with 27 mm Mitris valve and LAA ligation by Dr. Daniel on 08/26/24. TEE post-bypass with normal functioning prosthetic valves and moderately reduced LV. Became hypotensive after chest was closed with drop in cardiac function. Epinephrine  was started.   Extubated on 11/18. She has had trouble weaning from inotrope support post-op. Has remained on epi gtt and milrinone was started overnight 11/18-11/19. Overnight she struggled with worsening hypoxia and is now on 40L HHFNC, FiO2 100%. Marginal UOP with attempts to diurese so far. Has been struggling with recurrent nausea and vomiting.  I reviewed her echo today at bedside, EF 60-65%, stable bioprosthetic MV and AoV, TV not well-visualized but suspect from doppler signal that there could be severe TR, IVC dilated, mild-moderate RV dilation with normal systolic function.    Home Medications Prior to Admission medications   Medication Sig Start Date End Date Taking? Authorizing Provider  aspirin  81 MG tablet Take 1 tablet (81 mg total) by mouth daily. 04/21/15  Yes Magrinat, Sandria BROCKS, MD  chlorthalidone  (HYGROTON ) 25 MG tablet TAKE 1 TABLET(25 MG) BY MOUTH EVERY OTHER DAY Patient taking differently: Take 25 mg by mouth every Monday, Wednesday, and Friday. 04/03/24  Yes Daryl Setter, NP  enalapril  (VASOTEC ) 20 MG tablet Take 1 tablet (20 mg total) by mouth daily. Patient taking differently:  Take 20 mg by mouth at bedtime. 04/03/24  Yes Daryl Setter, NP  metoprolol  succinate (TOPROL -XL) 100 MG 24 hr tablet Take 1 tablet (100 mg total) by mouth daily. TAKE WITH OR IMMEDIATELY FOLLOWING A MEAL. Patient taking differently: Take 100 mg by mouth at bedtime. TAKE WITH OR IMMEDIATELY FOLLOWING A MEAL. 04/03/24  Yes Daryl Setter, NP  albuterol  (VENTOLIN  HFA) 108 (90 Base) MCG/ACT inhaler Inhale 1-2 puffs into the lungs every 6 (six) hours as needed for wheezing or shortness of breath. 05/23/22   O'Sullivan, Melissa, NP  fluticasone  (FLONASE ) 50 MCG/ACT nasal spray Place 1 spray into both nostrils as needed for allergies or rhinitis.    [provider]    Past Medical History: Past Medical History:  Diagnosis Date   Allergy    Arthritis    Breast cancer of upper-outer quadrant of right female breast (HCC) 11/14/2014   Treated with chemotherapy and radiation (diagnosed again in March 2024 in both breasts)   Heart murmur    History of chemotherapy    finished chemo 02/03/2015   History of kidney stones    History of seizures    as a child - unknown cause - states was never on anticonvulsants   Hypertension    states under control with meds., has been on med. x years   Mitral regurgitation    Mitral valve prolapse 1990   Rheumatic mitral valve with moderate to severe MR by echo 05/31/2024 => TEE suggests rheumatic mitral valve with severe calcified leaflets and severe mitral regurgitation but also severe aortic regurgitation   Osteoporosis  Personal history of chemotherapy    Personal history of radiation therapy    Port-A-Cath in place 01/24/2023   Seasonal allergies     Past Surgical History: Past Surgical History:  Procedure Laterality Date   ABDOMINAL HYSTERECTOMY  1998   partial   AORTIC VALVE REPLACEMENT N/A 08/26/2024   Procedure: REPLACEMENT, AORTIC VALVE, OPEN, UTILIZING INSPIRIS RESILIA  AORTIC VALVE;  Surgeon: Daniel Con RAMAN, MD;  Location:  [facility] OR;  Service: Open Heart Surgery;  Laterality: N/A;   APPENDECTOMY  1976   BREAST BIOPSY Right 09/30/2022   US  RT BREAST BX W LOC DEV 1ST LESION IMG BX SPEC US  GUIDE 09/30/2022 [facility]   BREAST BIOPSY Left 09/30/2022   US  LT BREAST BX W LOC DEV 1ST LESION IMG BX SPEC US  GUIDE 09/30/2022 [facility]   BREAST BIOPSY Right 09/30/2022   US  RT BREAST BX W LOC DEV EA ADD LESION IMG BX SPEC US  GUIDE 09/30/2022 [facility]   BREAST BIOPSY  12/14/2022   MM LT RADIOACTIVE SEED LOC MAMMO GUIDE 12/14/2022 [facility]   BREAST LUMPECTOMY Right 02/2015   BREAST LUMPECTOMY WITH RADIOACTIVE SEED AND SENTINEL LYMPH NODE BIOPSY Left 12/15/2022   Procedure: LEFT BREAST LUMPECTOMY WITH RADIOACTIVE SEED AND AXILLARY SENTINEL LYMPH NODE BIOPSY;  Surgeon: Ebbie Cough, MD;  Location: [facility];  Service: General;  Laterality: Left;   CLIPPING OF ATRIAL APPENDAGE N/A 08/26/2024   Procedure: CLIPPING, LEFT ATRIAL APPENDAGE UTILIZING ATRICURE PRO 40;  Surgeon: Daniel Con RAMAN, MD;  Location: [facility];  Service: Open Heart Surgery;  Laterality: N/A;   COLONOSCOPY  09/30/2016   COMPLEX WOUND CLOSURE N/A 07/18/2023   Procedure: CLOSURE CHEST WALL WOUND X2;  Surgeon: Ebbie Cough, MD;  Location: [facility] OR;  Service: General;  Laterality: N/A;   INTRAOPERATIVE TRANSESOPHAGEAL ECHOCARDIOGRAM N/A 08/26/2024   Procedure: ECHOCARDIOGRAM, TRANSESOPHAGEAL, INTRAOPERATIVE;  Surgeon: Daniel Con RAMAN, MD;  Location: [facility] OR;  Service: Open Heart Surgery;  Laterality: N/A;   KNEE ARTHROSCOPY Left 03/26/2008   MASTECTOMY W/ SENTINEL NODE BIOPSY Right 12/15/2022   Procedure: RIGHT MASTECTOMY WITH RIGHT AXILLARY SENTINEL LYMPH NODE BIOPSY;  Surgeon: Ebbie Cough, MD;  Location: [facility];  Service: General;  Laterality: Right;  180 MIN ROOM 9   MITRAL VALVE REPLACEMENT N/A 08/26/2024   Procedure: REPLACEMENT, MITRAL VALVE, UTILIZING MITRIS RESILIA MITRAL VALVE;  Surgeon: Daniel Con RAMAN, MD;  Location: [facility]  OR;  Service: Open Heart Surgery;  Laterality: N/A;   PLANTAR'S WART EXCISION Left    x 2   PORT-A-CATH REMOVAL Left 04/06/2015   Procedure: REMOVAL PORT-A-CATH;  Surgeon: Cough Ebbie, MD;  Location: [facility];  Service: General;  Laterality: Left;   PORT-A-CATH REMOVAL N/A 07/18/2023   Procedure: REMOVAL PORT-A-CATH;  Surgeon: Ebbie Cough, MD;  Location: [facility] OR;  Service: General;  Laterality: N/A;   PORTACATH PLACEMENT N/A 11/27/2014   Procedure: INSERTION PORT-A-CATH;  Surgeon: Cough Ebbie, MD;  Location: [facility];  Service: General;  Laterality: N/A;   PORTACATH PLACEMENT Right 12/15/2022   Procedure: INSERTION PORT-A-CATH;  Surgeon: Ebbie Cough, MD;  Location: [facility] OR;  Service: General;  Laterality: Right;   RADIOACTIVE SEED GUIDED PARTIAL MASTECTOMY WITH AXILLARY SENTINEL LYMPH NODE BIOPSY Right 03/02/2015   Procedure: RADIOACTIVE SEED GUIDED RIGHT PARTIAL MASTECTOMY WITH RIGHT AXILLARY SENTINEL LYMPH NODE BIOPSY;  Surgeon: Cough Ebbie, MD;  Location: [facility];  Service: General;  Laterality: Right;   RIGHT/LEFT HEART CATH AND CORONARY ANGIOGRAPHY N/A 07/16/2024  Procedure: RIGHT/LEFT HEART CATH AND CORONARY ANGIOGRAPHY;  Surgeon: Anner Alm ORN, MD;  Location: [facility] INVASIVE CV LAB;  Service: Cardiovascular;  Laterality: N/A;   SALIVARY STONE REMOVAL Right 1972   TONSILLECTOMY  1970s   TRANSESOPHAGEAL ECHOCARDIOGRAM (CATH LAB) N/A 06/21/2024   Procedure: TRANSESOPHAGEAL ECHOCARDIOGRAM;  Surgeon: Barbaraann Darryle Ned, MD;  Location: [facility] INVASIVE CV LAB;  Service: Cardiovascular;  Laterality: N/A;   TRANSTHORACIC ECHOCARDIOGRAM  01/16/2020   EF 60 to 65%.  GRII DD.  No R WMA.  Normal RV size and function.  Rheumatic mitral valve with moderate thickening-hockey-stick appearing.  Moderate MR with mild MS.  Moderate LA dilation.  (Recommend follow-up in 2 to 3 years)   TRANSTHORACIC ECHOCARDIOGRAM  05/31/2022    Normal LV size and function-EF 60 to 65%.  No RWMA. ??  Normal diastolic parameters but elevated LAP?  Normal RV with normal RVP and RAP.  Manage mitral valve with mild MR and no MS.  Mild to moderate AI with aortic sclerosis but no stenosis.   TUBAL LIGATION  1996    Family History: Family History  Problem Relation Age of Onset   Heart disease Mother    Stroke Mother    Hypertension Mother    Diabetes Mother    Heart attack Father    Diabetes Sister    Diabetes Sister    Kidney disease Brother    Mental retardation Brother    Colon polyps Daughter    Cancer Cousin    Breast cancer Cousin        deceased 42   Colon cancer Neg Hx    Esophageal cancer Neg Hx    Rectal cancer Neg Hx    Stomach cancer Neg Hx     Social History: Social History   Socioeconomic History   Marital status: Married    Spouse name: Not on file   Number of children: 1   Years of education: Not on file   Highest education level: Not on file  Occupational History   Not on file  Tobacco Use   Smoking status: Some Days    Current packs/day: 0.50    Average packs/day: 0.5 packs/day for 43.0 years (21.5 ttl pk-yrs)    Types: Cigarettes   Smokeless tobacco: Never   Tobacco comments:    08/22/24 - one pack of cigarettes every couple of days  Vaping Use   Vaping status: Never Used  Substance and Sexual Activity   Alcohol use: Not Currently    Comment: occ    Drug use: No   Sexual activity: Yes    Partners: Male  Other Topics Concern   Not on file  Social History Narrative   Married with 2 daughters- born 36 and 12 ([name] died in MVA 28-Sep-2016)   Previously worked -- therapist, 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  Has been doing part time work with [employer] -- had 6 active clients, but the most important one is her Daughter.   Regular exercise:  No   Caffeine Use: none; Current 1ppd smoker x > 39 yrs; EtOH ~4 oz/week   Social Drivers of Corporate Investment Banker Strain:  Not on file  Food Insecurity: Unknown (08/28/2024)   Hunger Vital Sign    Worried About Running Out of Food in the Last Year: Patient unable to answer    Ran Out of Food in the Last Year: Not on file  Transportation Needs: No Transportation Needs (08/29/2024)  PRAPARE - Administrator, Civil Service (Medical): No    Lack of Transportation (Non-Medical): No  Physical Activity: Not on file  Stress: Not on file  Social Connections: Unknown (08/28/2024)   Social Connection and Isolation Panel    Frequency of Communication with Friends and Family: Never    Frequency of Social Gatherings with Friends and Family: Never    Attends Religious Services: Never    Database Administrator or Organizations: Yes    Attends Banker Meetings: 1 to 4 times per year    Marital Status: Patient declined    Allergies:  Allergies  Allergen Reactions   Sulfa Antibiotics Other (See Comments)    UNKNOWN    Objective:    Vital Signs:   Temp:  [98.1 F (36.7 C)-99.3 F (37.4 C)] 98.8 F (37.1 C) (11/20 0600) Pulse Rate:  [80-90] 84 (11/20 0600) Resp:  [18-38] 20 (11/20 0600) BP: (94-147)/(59-76) 103/67 (11/20 0600) SpO2:  [74 %-96 %] 92 % (11/20 0600) Arterial Line BP: (89-181)/(42-75) 134/53 (11/20 0600) FiO2 (%):  [80 %-100 %] 100 % (11/20 0344) Weight:  [108.2 kg] 108.2 kg (11/20 0500) Last BM Date :  (PTA)  Weight change: Filed Weights   08/27/24 0600 08/28/24 0650 08/29/24 0500  Weight: 105 kg 106.1 kg 108.2 kg    Intake/Output:   Intake/Output Summary (Last 24 hours) at 08/29/2024 0716 Last data filed at 08/29/2024 0600 Gross per 24 hour  Intake 1245.58 ml  Output 1075 ml  Net 170.58 ml      Physical Exam    General:  Fatigued appearing Cor: JVP to jaw.  Regular rate & rhythm. No murmurs. Lungs: breathing nonlabored on HHFNC Abdomen: obese, distended Extremities: 2+ edema Neuro: alert & orientedx3. Affect pleasant   Telemetry   ECG: Atypical  atrial flutter rate 70s (personally reviewed)    Labs   Basic Metabolic Panel: Recent Labs  Lab 08/26/24 2109 08/26/24 2128 08/27/24 0442 08/27/24 1358 08/27/24 1736 08/28/24 0507 08/28/24 1728 08/29/24 0105 08/29/24 0519  NA 138   < > 141   < > 139 139 138 136 135  K 3.6   < > 3.8   < > 3.9 3.9 3.6 4.0 4.0  CL 105  --  106  --  107 105 102  --  100  CO2 22  --  23  --  24 23 24   --  20*  GLUCOSE 142*  --  124*  --  119* 157* 131*  --  124*  BUN 14  --  12  --  12 16 19   --  22  CREATININE 1.07*  --  0.98  --  0.94 1.29* 1.23*  --  1.49*  CALCIUM 7.6*  --  7.7*  --  7.6* 7.9* 8.0*  --  7.7*  MG 3.5*  --  2.9*  --  2.7* 2.7*  --   --  2.4   < > = values in this interval not displayed.    Liver Function Tests: Recent Labs  Lab 08/22/24 1034  AST 17  ALT 16  ALKPHOS 71  BILITOT 0.7  PROT 7.6  ALBUMIN 3.4*   No results for input(s): LIPASE, AMYLASE in the last 168 hours. No results for input(s): AMMONIA in the last 168 hours.  CBC: Recent Labs  Lab 08/26/24 2109 08/26/24 2128 08/27/24 0442 08/27/24 1358 08/27/24 1736 08/28/24 0507 08/29/24 0105 08/29/24 0519  WBC 12.7*  --  9.9  --  12.2* 10.5  --  5.7  HGB 10.5*   < > 10.3* 9.2* 9.4* 9.0* 8.5* 8.2*  HCT 30.9*   < > 30.5* 27.0* 27.9* 26.6* 25.0* 25.0*  MCV 96.9  --  95.9  --  97.6 97.4  --  97.7  PLT 115*  --  125*  --  92* 88*  --  82*   < > = values in this interval not displayed.    Cardiac Enzymes: No results for input(s): CKTOTAL, CKMB, CKMBINDEX, TROPONINI in the last 168 hours.  BNP: BNP (last 3 results) No results for input(s): BNP in the last 8760 hours.  ProBNP (last 3 results) No results for input(s): PROBNP in the last 8760 hours.   CBG: Recent Labs  Lab 08/28/24 1209 08/28/24 1608 08/28/24 2033 08/29/24 0022 08/29/24 0523  GLUCAP 125* 118* 110* 121* 117*    Coagulation Studies: Recent Labs    08/26/24 1620  LABPROT 21.5*  INR 1.8*     Imaging    DG Abd Portable 1V Result Date: 08/28/2024 CLINICAL DATA:  Nausea and vomiting. EXAM: PORTABLE ABDOMEN - 1 VIEW COMPARISON:  08/26/2024 FINDINGS: Nonspecific, nonobstructive bowel gas pattern with multiple air-filled nondilated centralized small bowel loops. Air within the colon. Small caliber catheter over the rectum. No free peritoneal air. Remainder of the exam is unchanged. IMPRESSION: Nonspecific, nonobstructive bowel gas pattern. Electronically Signed   By: Toribio Agreste M.D.   On: 08/28/2024 17:37     Medications:     Current Medications:  acetaminophen   1,000 mg Oral Q6H   Or   acetaminophen  (TYLENOL ) oral liquid 160 mg/5 mL  1,000 mg Per Tube Q6H   aspirin  EC  325 mg Oral Daily   Or   aspirin   324 mg Per Tube Daily   bisacodyl   10 mg Oral Daily   Or   bisacodyl   10 mg Rectal Daily   Chlorhexidine  Gluconate Cloth  6 each Topical Daily   docusate sodium   200 mg Oral Daily   feeding supplement  237 mL Oral BID BM   heparin  injection (subcutaneous)  5,000 Units Subcutaneous Q8H   insulin  aspart  0-24 Units Subcutaneous Q4H   ipratropium-albuterol   3 mL Nebulization Q4H   melatonin  3 mg Oral QHS   pantoprazole   40 mg Oral Daily   scopolamine   1 patch Transdermal Q72H   sodium chloride  flush  10-40 mL Intracatheter Q12H   sodium chloride  flush  3 mL Intravenous Q12H    Infusions:  albumin  human Stopped (08/27/24 0721)   clevidipine  2 mg/hr (08/28/24 2154)   epinephrine  3 mcg/min (08/29/24 0600)   milrinone  0.375 mcg/kg/min (08/29/24 0600)   nitroGLYCERIN      norepinephrine  (LEVOPHED ) Adult infusion Stopped (08/27/24 2147)   promethazine  (PHENERGAN ) injection (IM or IVPB) Stopped (08/29/24 0409)      Patient Profile   68 y.o. female with history of R breast cancer s/p mastectomy and radiation, obesity, rheumatic valvular disease w/ severe MR and severe AI, tobacco use, LBBB.  Underwent bioprosthetic AVR and MVR and LAA ligation on 11/17. Post-op has struggled with  biventricular heart failure (suspect primarily RV failure).  Assessment/Plan   1. Rheumatic heart disease: Severe MR and severe AI on 9/25 TEE with EF 55-60%. S/p AVR with 21 mm Inspiris valve, MVR with 27 mm Mitris valve and LAA ligation by Dr. Daniel on 08/26/24. Echo today briefly reviewed at bedside, showed EF 60-65%, stable bioprosthetic MV and AoV, TV not well-visualized but suspect from  doppler signal that there could be severe TR, IVC dilated, mild-moderate RV dilation with normal systolic function.  2. Post-op cardiogenic shock/RV failure:  Echo today briefly reviewed at bedside, showed EF 60-65%, stable bioprosthetic MV and AoV, TV not well-visualized but suspect from doppler signal that there could be severe TR, IVC dilated, mild-moderate RV dilation with normal systolic function. CVP 19-20 on my read, PA waveform dampened; CI 2.2 thermo/co-ox 56%.  She is on epinephrine  3, milrinone 0.375.  - Continue epinephrine  3 and milrinone 0.375 for now.  - Lasix 80 mg IV bolus this morning, start gtt at 15 mg/hr.  3. Atrial flutter: Atypical, noted post-op.  - Start amiodarone gtt.  - anticoagulation when ok per surgery. Heparin  prophylaxis for now.  4. AKI: Creatinine up 1.2 => 1.49.  Hopefully diuresis will help by lower renal venous pressure.  5. Anemia: Post-op.  Transfuse hgb < 8.   CRITICAL CARE Performed by: Ezra Shuck  Total critical care time: 70 minutes  Critical care time was exclusive of separately billable procedures and treating other patients.  Critical care was necessary to treat or prevent imminent or life-threatening deterioration.  Critical care was time spent personally by me on the following activities: development of treatment plan with patient and/or surrogate as well as nursing, discussions with consultants, evaluation of patient's response to treatment, examination of patient, obtaining history from patient or surrogate, ordering and performing treatments and  interventions, ordering and review of laboratory studies, ordering and review of radiographic studies, pulse oximetry and re-evaluation of patient's condition.   Length of Stay: 3  Ezra Shuck 08/29/2024 8:46 AM   Advanced Heart Failure Team Pager [phone] (M-F; 7a - 5p)  Please contact [facility] for night-coverage after hours (4p -7a ) and weekends on [email or web address]

## 2024-08-29 NOTE — Progress Notes (Signed)
   [address] [place] [address]             [phone]   POD # 3 AVR, MVR  Up in chair  BP 138/81   Pulse 90   Temp 100 F (37.8 C)   Resp (!) 24   Ht 5' 1 (1.549 m)   Wt 108.2 kg   LMP 10/10/1996   SpO2 96%   BMI 45.07 kg/m  CVP 11 Milrinone  0.375, epi 3, Lasix  15 Amiodarone  30 mg/min   Intake/Output Summary (Last 24 hours) at 08/29/2024 1756 Last data filed at 08/29/2024 1700 Gross per 24 hour  Intake 1528.91 ml  Output 1660 ml  Net -131.09 ml    K 4.0, creatinine 1.4  Stable day  Elspeth C. Kerrin, MD [facility] [phone]

## 2024-08-29 NOTE — Progress Notes (Addendum)
 Peripherally Inserted Central Catheter Placement  The IV Nurse has discussed with the patient and/or persons authorized to consent for the patient, the purpose of this procedure and the potential benefits and risks involved with this procedure.  The benefits include less needle sticks, lab draws from the catheter, and the patient may be discharged home with the catheter. Risks include, but not limited to, infection, bleeding, blood clot (thrombus formation), and puncture of an artery; nerve damage and irregular heartbeat and possibility to perform a PICC exchange if needed/ordered by physician.  Alternatives to this procedure were also discussed.  Bard Power PICC patient education guide, fact sheet on infection prevention and patient information card has been provided to patient /or left at bedside.  PICC very difficult to get to drop into the SVC, requiring multiple interventions to do so.  CXR pending for tip verification.  PICC Placement Documentation  PICC Triple Lumen 08/29/24 Right Basilic 40 cm 0 cm (Active)  Indication for Insertion or Continuance of Line Vasoactive infusions 08/29/24 2217  Exposed Catheter (cm) 0 cm 08/29/24 2217  Site Assessment Clean, Dry, Intact 08/29/24 2217  Lumen #1 Status Flushed;Saline locked;Blood return noted 08/29/24 2217  Lumen #2 Status Flushed;Saline locked;Blood return noted 08/29/24 2217  Lumen #3 Status Flushed;Saline locked;Blood return noted 08/29/24 2217  Dressing Type Transparent;Securing device 08/29/24 2217  Dressing Status Antimicrobial disc/dressing in place;Clean, Dry, Intact 08/29/24 2217  Line Care Connections checked and tightened 08/29/24 2217  Line Adjustment (NICU/IV Team Only) No 08/29/24 2217  Dressing Intervention New dressing;Adhesive placed at insertion site (IV team only) 08/29/24 2217  Dressing Change Due 09/05/24 08/29/24 2217       Katie Jacobson 08/29/2024, 10:18 PM

## 2024-08-29 NOTE — Evaluation (Signed)
 Occupational Therapy Evaluation Patient Details Name: Katie Jacobson MRN: [id number] DOB: [birth year] Today's Date: 08/29/2024   History of Present Illness   Pt is 68 yo presenting to [facility] on 11/17 for scheduled AVR/MVR. PMH: breast cancer (triple negative), rheumatic valve disease, severe AI, sever mitral regurgitation, HTN, HLD     Clinical Impressions Pt admitted based on above, and was seen based on problem list below. PTA pt was independent with ADLs and IADLs. Today pt is requiring set up  to min assist for ADLs. Bed mobility was min assist and functional transfers are  CGA with use of RW. Pt limited by decreased activity tolerance and generalized weakness. Based on pt's performance and motivation to improve , anticipating would tolerate and greatly benefit from >3 hours of skilled rehab daily. OT will continue to follow acutely to maximize functional independence.     If plan is discharge home, recommend the following:   A lot of help with walking and/or transfers;A lot of help with bathing/dressing/bathroom;Assistance with cooking/housework     Functional Status Assessment   Patient has had a recent decline in their functional status and demonstrates the ability to make significant improvements in function in a reasonable and predictable amount of time.     Equipment Recommendations   Other (comment) (Defer to next venue)      Precautions/Restrictions   Precautions Precautions: Sternal Precaution Booklet Issued: No Recall of Precautions/Restrictions: Intact Restrictions Weight Bearing Restrictions Per Provider Order: No Other Position/Activity Restrictions: sternal precautions     Mobility Bed Mobility Overal bed mobility: Needs Assistance Bed Mobility: Supine to Sit     Supine to sit: Min assist, HOB elevated, +2 for safety/equipment     General bed mobility comments: Min assist for cues to maintain sternal precautions    Transfers Overall  transfer level: Needs assistance Equipment used: Rolling walker (2 wheels) Transfers: Sit to/from Stand Sit to Stand: +2 safety/equipment, Contact guard assist           General transfer comment: CGA +2 for safety. Good hand placement with use of heart pillow. CGA for short distance mobility      Balance Overall balance assessment: Needs assistance Sitting-balance support: Bilateral upper extremity supported, Feet supported Sitting balance-Leahy Scale: Fair     Standing balance support: Bilateral upper extremity supported, During functional activity, Reliant on assistive device for balance Standing balance-Leahy Scale: Poor Standing balance comment: Benefits from BUE support     ADL either performed or assessed with clinical judgement   ADL Overall ADL's : Needs assistance/impaired Eating/Feeding: Set up;Sitting   Grooming: Set up;Sitting   Upper Body Bathing: Sitting;Set up   Lower Body Bathing: Minimal assistance;Sit to/from stand   Upper Body Dressing : Set up;Sitting   Lower Body Dressing: Minimal assistance;Sit to/from stand   Toilet Transfer: Contact guard assist;Ambulation;Rolling walker (2 wheels)           Functional mobility during ADLs: Contact guard assist;Rolling walker (2 wheels) General ADL Comments: Pt limited by decreased activity tolerance     Vision Baseline Vision/History: 1 Wears glasses Patient Visual Report: No change from baseline Vision Assessment?: No apparent visual deficits            Pertinent Vitals/Pain Pain Assessment Pain Assessment: Faces Faces Pain Scale: Hurts little more Pain Location: generalized Pain Descriptors / Indicators: Aching, Discomfort Pain Intervention(s): Monitored during session     Extremity/Trunk Assessment Upper Extremity Assessment Upper Extremity Assessment: Generalized weakness   Lower Extremity Assessment Lower Extremity Assessment:  Defer to PT evaluation   Cervical / Trunk  Assessment Cervical / Trunk Assessment: Kyphotic   Communication Communication Communication: No apparent difficulties   Cognition Arousal: Alert Behavior During Therapy: WFL for tasks assessed/performed Cognition: No apparent impairments   Following commands: Intact       Cueing  General Comments   Cueing Techniques: Verbal cues  Noted soft BP after mobility, RN present and notified           Home Living Family/patient expects to be discharged to:: Private residence Living Arrangements: Spouse/significant other;Children Available Help at Discharge: Family;Available 24 hours/day Type of Home: House Home Access: Stairs to enter [employer] of Steps: 2-3 Entrance Stairs-Rails: None Home Layout: One level     Bathroom Shower/Tub: Chief Strategy Officer: Standard Bathroom Accessibility: Yes   Home Equipment: None          Prior Functioning/Environment Prior Level of Function : Independent/Modified Independent;Driving       Mobility Comments: Ind, no AD ADLs Comments: Ind    OT Problem List: Decreased strength;Decreased range of motion;Decreased activity tolerance;Impaired balance (sitting and/or standing);Decreased knowledge of precautions;Cardiopulmonary status limiting activity   OT Treatment/Interventions: Self-care/ADL training;Therapeutic exercise;Energy conservation;DME and/or AE instruction;Therapeutic activities;Patient/family education;Balance training      OT Goals(Current goals can be found in the care plan section)   Acute Rehab OT Goals Patient Stated Goal: To eat OT Goal Formulation: With patient Time For Goal Achievement: 09/12/24 Potential to Achieve Goals: Good   OT Frequency:  Min 2X/week       AM-PAC OT 6 Clicks Daily Activity     Outcome Measure Help from another person eating meals?: None Help from another person taking care of personal grooming?: A Little Help from another person toileting, which  includes using toliet, bedpan, or urinal?: A Little Help from another person bathing (including washing, rinsing, drying)?: A Little Help from another person to put on and taking off regular upper body clothing?: A Little Help from another person to put on and taking off regular lower body clothing?: A Little 6 Click Score: 19   End of Session Equipment Utilized During Treatment: Rolling walker (2 wheels);Oxygen Nurse Communication: Mobility status  Activity Tolerance: Patient limited by fatigue Patient left: with call bell/phone within reach;in chair;with family/visitor present  OT Visit Diagnosis: Unsteadiness on feet (R26.81);Other abnormalities of gait and mobility (R26.89);Muscle weakness (generalized) (M62.81)                Time: 8950-8883 OT Time Calculation (min): 27 min Charges:  OT General Charges $OT Visit: 1 Visit OT Evaluation $OT Eval Moderate Complexity: 1 Mod  Antanisha Mohs C, OT  Acute Rehabilitation Services Office [phone] Secure chat preferred   Adrianne GORMAN Savers 08/29/2024, 3:00 PM

## 2024-08-29 NOTE — Progress Notes (Signed)
 eLink Physician-Brief Progress Note Patient Name: Katie Jacobson DOB: [birth year] MRN: [id number]   Date of Service  08/29/2024  HPI/Events of Note  Patient having oxygenation problems.  eICU Interventions  High flow oxygen ordered, stat portable CXR, charge RN to notify TCTS of the problems patient is having.        Rayshaun Needle U Wyn Nettle 08/29/2024, 12:31 AM

## 2024-08-30 ENCOUNTER — Other Ambulatory Visit: Payer: Self-pay

## 2024-08-30 ENCOUNTER — Inpatient Hospital Stay (HOSPITAL_COMMUNITY)

## 2024-08-30 DIAGNOSIS — I340 Nonrheumatic mitral (valve) insufficiency: Secondary | ICD-10-CM | POA: Diagnosis not present

## 2024-08-30 DIAGNOSIS — T8111XA Postprocedural  cardiogenic shock, initial encounter: Secondary | ICD-10-CM | POA: Diagnosis not present

## 2024-08-30 DIAGNOSIS — I352 Nonrheumatic aortic (valve) stenosis with insufficiency: Secondary | ICD-10-CM | POA: Diagnosis not present

## 2024-08-30 DIAGNOSIS — Z48812 Encounter for surgical aftercare following surgery on the circulatory system: Secondary | ICD-10-CM | POA: Diagnosis not present

## 2024-08-30 DIAGNOSIS — I350 Nonrheumatic aortic (valve) stenosis: Secondary | ICD-10-CM | POA: Diagnosis not present

## 2024-08-30 DIAGNOSIS — J811 Chronic pulmonary edema: Secondary | ICD-10-CM | POA: Diagnosis not present

## 2024-08-30 DIAGNOSIS — I517 Cardiomegaly: Secondary | ICD-10-CM | POA: Diagnosis not present

## 2024-08-30 DIAGNOSIS — I5021 Acute systolic (congestive) heart failure: Secondary | ICD-10-CM | POA: Diagnosis not present

## 2024-08-30 DIAGNOSIS — R918 Other nonspecific abnormal finding of lung field: Secondary | ICD-10-CM | POA: Diagnosis not present

## 2024-08-30 DIAGNOSIS — I4892 Unspecified atrial flutter: Secondary | ICD-10-CM | POA: Diagnosis not present

## 2024-08-30 DIAGNOSIS — J90 Pleural effusion, not elsewhere classified: Secondary | ICD-10-CM | POA: Diagnosis not present

## 2024-08-30 DIAGNOSIS — Z952 Presence of prosthetic heart valve: Secondary | ICD-10-CM | POA: Diagnosis not present

## 2024-08-30 DIAGNOSIS — Z452 Encounter for adjustment and management of vascular access device: Secondary | ICD-10-CM | POA: Diagnosis not present

## 2024-08-30 LAB — COOXEMETRY PANEL
Carboxyhemoglobin: 1.7 % — ABNORMAL HIGH (ref 0.5–1.5)
Carboxyhemoglobin: 1 % (ref 0.5–1.5)
Carboxyhemoglobin: 1.9 % — ABNORMAL HIGH (ref 0.5–1.5)
Methemoglobin: <0.7 % (ref 0.0–1.5)
Methemoglobin: <0.7 % (ref 0.0–1.5)
Methemoglobin: <0.7 % (ref 0.0–1.5)
O2 Saturation: 63.7 %
O2 Saturation: 44.7 %
O2 Saturation: 56.1 %
Total hemoglobin: 8.7 g/dL — ABNORMAL LOW (ref 12.0–16.0)
Total hemoglobin: 8.8 g/dL — ABNORMAL LOW (ref 12.0–16.0)
Total hemoglobin: 8.5 g/dL — ABNORMAL LOW (ref 12.0–16.0)

## 2024-08-30 LAB — BASIC METABOLIC PANEL WITH GFR
Anion gap: 14 (ref 5–15)
Anion gap: 15 (ref 5–15)
Anion gap: 15 (ref 5–15)
BUN: 22 mg/dL (ref 8–23)
BUN: 18 mg/dL (ref 8–23)
BUN: 17 mg/dL (ref 8–23)
CO2: 24 mmol/L (ref 22–32)
CO2: 25 mmol/L (ref 22–32)
CO2: 26 mmol/L (ref 22–32)
Calcium: 8.1 mg/dL — ABNORMAL LOW (ref 8.9–10.3)
Calcium: 8 mg/dL — ABNORMAL LOW (ref 8.9–10.3)
Calcium: 8.1 mg/dL — ABNORMAL LOW (ref 8.9–10.3)
Chloride: 97 mmol/L — ABNORMAL LOW (ref 98–111)
Chloride: 88 mmol/L — ABNORMAL LOW (ref 98–111)
Chloride: 89 mmol/L — ABNORMAL LOW (ref 98–111)
Creatinine, Ser: 1.4 mg/dL — ABNORMAL HIGH (ref 0.44–1.00)
Creatinine, Ser: 1.37 mg/dL — ABNORMAL HIGH (ref 0.44–1.00)
Creatinine, Ser: 1.36 mg/dL — ABNORMAL HIGH (ref 0.44–1.00)
GFR, Estimated: 42 mL/min — ABNORMAL LOW (ref >60)
GFR, Estimated: 43 mL/min — ABNORMAL LOW (ref >60)
GFR, Estimated: 43 mL/min — ABNORMAL LOW (ref >60)
Glucose, Bld: 116 mg/dL — ABNORMAL HIGH (ref 70–99)
Glucose, Bld: 268 mg/dL — ABNORMAL HIGH (ref 70–99)
Glucose, Bld: 247 mg/dL — ABNORMAL HIGH (ref 70–99)
Potassium: 3.4 mmol/L — ABNORMAL LOW (ref 3.5–5.1)
Potassium: 3 mmol/L — ABNORMAL LOW (ref 3.5–5.1)
Potassium: 3 mmol/L — ABNORMAL LOW (ref 3.5–5.1)
Sodium: 135 mmol/L (ref 135–145)
Sodium: 128 mmol/L — ABNORMAL LOW (ref 135–145)
Sodium: 130 mmol/L — ABNORMAL LOW (ref 135–145)

## 2024-08-30 LAB — GLUCOSE, CAPILLARY
Glucose-Capillary: 191 mg/dL — ABNORMAL HIGH (ref 70–99)
Glucose-Capillary: 70 mg/dL (ref 70–99)
Glucose-Capillary: 103 mg/dL — ABNORMAL HIGH (ref 70–99)
Glucose-Capillary: 119 mg/dL — ABNORMAL HIGH (ref 70–99)
Glucose-Capillary: 86 mg/dL (ref 70–99)
Glucose-Capillary: 261 mg/dL — ABNORMAL HIGH (ref 70–99)

## 2024-08-30 LAB — CBC
HCT: 24.4 % — ABNORMAL LOW (ref 36.0–46.0)
Hemoglobin: 8.3 g/dL — ABNORMAL LOW (ref 12.0–15.0)
MCH: 32.8 pg (ref 26.0–34.0)
MCHC: 34 g/dL (ref 30.0–36.0)
MCV: 96.4 fL (ref 80.0–100.0)
Platelets: 109 K/uL — ABNORMAL LOW (ref 150–400)
RBC: 2.53 MIL/uL — ABNORMAL LOW (ref 3.87–5.11)
RDW: 16.2 % — ABNORMAL HIGH (ref 11.5–15.5)
WBC: 4.4 K/uL (ref 4.0–10.5)
nRBC: 5 % — ABNORMAL HIGH (ref 0.0–0.2)

## 2024-08-30 LAB — MAGNESIUM
Magnesium: 2 mg/dL (ref 1.7–2.4)
Magnesium: 1.7 mg/dL (ref 1.7–2.4)

## 2024-08-30 MED ORDER — EPINEPHRINE HCL 5 MG/250ML IV SOLN IN NS
1.0000 ug/min | INTRAVENOUS | Status: DC
  Administered 2024-08-31 – 2024-09-03 (×2): 1 ug/min via INTRAVENOUS
  Filled 2024-08-30 (×2): qty 250

## 2024-08-30 MED ORDER — HEPARIN SODIUM (PORCINE) 5000 UNIT/ML IJ SOLN
5000.0000 [IU] | Freq: Three times a day (TID) | INTRAMUSCULAR | Status: DC
  Administered 2024-08-30 – 2024-09-02 (×8): 5000 [IU] via SUBCUTANEOUS
  Filled 2024-08-30 (×8): qty 1

## 2024-08-30 MED ORDER — SODIUM CHLORIDE 0.9% FLUSH
10.0000 mL | Freq: Two times a day (BID) | INTRAVENOUS | Status: DC
  Administered 2024-08-30: 20 mL
  Administered 2024-08-31 (×2): 10 mL
  Administered 2024-09-02: 40 mL
  Administered 2024-09-02 – 2024-09-04 (×4): 10 mL
  Administered 2024-09-04: 20 mL
  Administered 2024-09-05: 10 mL
  Administered 2024-09-05: 20 mL
  Administered 2024-09-06 – 2024-09-07 (×4): 10 mL
  Administered 2024-09-08: 30 mL
  Administered 2024-09-08 – 2024-09-09 (×3): 10 mL
  Administered 2024-09-10: 30 mL
  Administered 2024-09-10 – 2024-09-12 (×3): 10 mL

## 2024-08-30 MED ORDER — SODIUM CHLORIDE 0.9% FLUSH: 10.0000 mL | INTRAVENOUS | Status: DC | PRN

## 2024-08-30 MED ORDER — HEPARIN (PORCINE) 25000 UT/250ML-% IV SOLN
700.0000 [IU]/h | INTRAVENOUS | Status: DC
  Administered 2024-08-30: 700 [IU]/h via INTRAVENOUS
  Filled 2024-08-30: qty 250

## 2024-08-30 MED ORDER — SODIUM CHLORIDE 0.9 % IV SOLN
150.0000 mg | Freq: Once | INTRAVENOUS | Status: AC
  Administered 2024-08-30: 150 mg via INTRAVENOUS
  Filled 2024-08-30: qty 5

## 2024-08-30 MED ORDER — POTASSIUM CHLORIDE 10 MEQ/50ML IV SOLN
10.0000 meq | INTRAVENOUS | Status: AC
  Administered 2024-08-30: 10 meq via INTRAVENOUS
  Filled 2024-08-30 (×2): qty 50

## 2024-08-30 MED ORDER — POTASSIUM CHLORIDE 10 MEQ/50ML IV SOLN
10.0000 meq | INTRAVENOUS | Status: AC
  Administered 2024-08-30 (×3): 10 meq via INTRAVENOUS
  Filled 2024-08-30 (×3): qty 50

## 2024-08-30 MED ORDER — POTASSIUM CHLORIDE 10 MEQ/50ML IV SOLN
10.0000 meq | INTRAVENOUS | Status: AC
  Administered 2024-08-30 – 2024-08-31 (×3): 10 meq via INTRAVENOUS
  Filled 2024-08-30 (×3): qty 50

## 2024-08-30 MED ORDER — POTASSIUM CHLORIDE 10 MEQ/50ML IV SOLN
10.0000 meq | INTRAVENOUS | Status: AC
  Administered 2024-08-30 (×2): 10 meq via INTRAVENOUS
  Filled 2024-08-30: qty 50

## 2024-08-30 MED ORDER — SMOG ENEMA
960.0000 mL | Freq: Once | RECTAL | Status: AC
  Administered 2024-08-30: 960 mL via RECTAL
  Filled 2024-08-30 (×2): qty 960

## 2024-08-30 MED ORDER — METOLAZONE 5 MG PO TABS
5.0000 mg | ORAL_TABLET | Freq: Once | ORAL | Status: AC
  Administered 2024-08-30: 5 mg via ORAL
  Filled 2024-08-30: qty 1

## 2024-08-30 MED ORDER — SORBITOL 70 % SOLN
60.0000 mL | Freq: Once | Status: AC
  Administered 2024-08-30: 60 mL via ORAL
  Filled 2024-08-30: qty 60

## 2024-08-30 MED ORDER — TRAZODONE HCL 50 MG PO TABS: 50.0000 mg | ORAL_TABLET | Freq: Every day | ORAL | Status: DC

## 2024-08-30 NOTE — Progress Notes (Signed)
 PICC insertion: Requested radiologist addendum on 11/21 CXR report to notate tip confirmation. Line was exchanged after initial insertion 11/20.

## 2024-08-30 NOTE — Progress Notes (Signed)
 4 Days Post-Op Procedure(s) (LRB): REPLACEMENT, AORTIC VALVE, OPEN, UTILIZING INSPIRIS RESILIA  AORTIC VALVE (N/A) REPLACEMENT, MITRAL VALVE, UTILIZING MITRIS RESILIA MITRAL VALVE (N/A) CLIPPING, LEFT ATRIAL APPENDAGE UTILIZING ATRICURE PRO 40 (N/A) ECHOCARDIOGRAM, TRANSESOPHAGEAL, INTRAOPERATIVE (N/A) Subjective: Nausea better this morning Increased oxygen requirement overnight Epi weaned off Responding very well to lasix  On amio gtt for Aflutter  Objective: Vital signs in last 24 hours: BP 108/62   Pulse 100   Temp (!) 97.3 F (36.3 C) (Axillary)   Resp 19   Ht 5' 1 (1.549 m)   Wt 108.3 kg   LMP 10/10/1996   SpO2 92%   BMI 45.11 kg/m  Filed Weights   08/28/24 0650 08/29/24 0500 08/30/24 0500  Weight: 106.1 kg 108.2 kg 108.3 kg    Hemodynamic parameters for last 24 hours: CVP:  [13 mmHg-20 mmHg] 14 mmHg  Intake/Output from previous day: 11/20 0701 - 11/21 0700 In: 1648.2 [I.V.:1112.7; IV Piggyback:535.5] Out: 3580 [Urine:3580] Intake/Output this shift: Total I/O In: 757.6 [I.V.:335.3; IV Piggyback:422.3] Out: 1200 [Urine:1200]  Physical Exam: General - Resting comfortably in chair CV - RRR Resp - Unlabored on HHFNC Abd - Soft, ND/NT Ext - Moderate extremity edema  Lab Results:    Latest Ref Rng & Units 08/30/2024    4:21 AM 08/29/2024    5:19 AM 08/29/2024    1:05 AM  CBC  WBC 4.0 - 10.5 K/uL 4.4  5.7    Hemoglobin 12.0 - 15.0 g/dL 8.3  8.2  8.5   Hematocrit 36.0 - 46.0 % 24.4  25.0  25.0   Platelets 150 - 400 K/uL 109  82        Latest Ref Rng & Units 08/30/2024    4:21 AM 08/29/2024    5:53 PM 08/29/2024    1:30 PM  CMP  Glucose 70 - 99 mg/dL 883  838  863   BUN 8 - 23 mg/dL 22  23  23    Creatinine 0.44 - 1.00 mg/dL 8.59  8.54  8.61   Sodium 135 - 145 mmol/L 135  133  134   Potassium 3.5 - 5.1 mmol/L 3.4  3.9  4.0   Chloride 98 - 111 mmol/L 97  97  100   CO2 22 - 32 mmol/L 24  22  20    Calcium  8.9 - 10.3 mg/dL 8.1  7.7  7.6      CXR: Stable right pleural effusion  Assessment/Plan: S/P Procedure(s) (LRB): REPLACEMENT, AORTIC VALVE, OPEN, UTILIZING INSPIRIS RESILIA  AORTIC VALVE (N/A) REPLACEMENT, MITRAL VALVE, UTILIZING MITRIS RESILIA MITRAL VALVE (N/A) CLIPPING, LEFT ATRIAL APPENDAGE UTILIZING ATRICURE PRO 40 (N/A) ECHOCARDIOGRAM, TRANSESOPHAGEAL, INTRAOPERATIVE (N/A) POD 4 s/p bioAVR/MVR, LAAL NEURO- intact  Pain control PRN Deconditioning- PT/OT CV- in and out of A flutter in the 80s, LAA ligated in the OR, ok to be without AC for now             Remove pacing wires RESP- Continued improved lung aeration             Continue IS, pulm hygiene, ambulation RENAL- creatinine stable and lytes Ok  Weight unchanged             Continue Lasix  gtt  Foley will stay for now GI- tolerating clears, still with some nausea, redose Emend  if needed  BM: None since surgery - suppository and enema today Endo- BG well controlled Continue ISS ID- Discontinue unasyn  as no fever or leukocytosis DVT ppx - SCD +  HSQ 5000 TID  Dispo: ICU   LOS: 4 days    Con RAMAN Anyssa Sharpless 08/30/2024

## 2024-08-30 NOTE — Progress Notes (Signed)
 Patient ID: Katie Jacobson, female   DOB: 17-Sep-1956, 68 y.o.   MRN: [id number]     Advanced Heart Failure Rounding Note  Cardiologist: Alm Clay, MD  Chief Complaint: CHF Subjective:    Patient is currently on milrinone  0.375 with co-ox 45%.  Earlier this morning, she was also on epinephrine  1 with co-ox 64% but epinephrine  was stopped.   She is on Lasix  gtt 15 mg/hr with I/Os net negative 1632 cc yesterday.  CVP remain 18.    She is in atrial flutter with rate 80s, on amiodarone  gtt 30 mg/hr.   Post-op echo showed LV EF 65%, mild RV dilation with normal systolic function but there is likely severe tricuspid regurgitation, s/p bioprosthetic MV replacement with mean gradient 4, s/p bioprosthetic AV replacement with mean gradient 20.    Objective:   Weight Range: 108.3 kg Body mass index is 45.11 kg/m.   Vital Signs:   Temp:  [97.5 F (36.4 C)-100.2 F (37.9 C)] 97.5 F (36.4 C) (11/21 0748) Pulse Rate:  [74-90] 77 (11/21 0900) Resp:  [19-33] 27 (11/21 0900) BP: (87-138)/(49-90) 102/75 (11/21 0800) SpO2:  [88 %-100 %] 99 % (11/21 0900) Arterial Line BP: (107-152)/(40-57) 132/51 (11/20 1700) FiO2 (%):  [60 %-100 %] 70 % (11/21 0800) Weight:  [108.3 kg] 108.3 kg (11/21 0500) Last BM Date :  (PTA)  Weight change: Filed Weights   08/28/24 0650 08/29/24 0500 08/30/24 0500  Weight: 106.1 kg 108.2 kg 108.3 kg    Intake/Output:   Intake/Output Summary (Last 24 hours) at 08/30/2024 1003 Last data filed at 08/30/2024 0900 Gross per 24 hour  Intake 1688.68 ml  Output 2930 ml  Net -1241.32 ml      Physical Exam    General:  Well appearing. No resp difficulty HEENT: Normal Neck: Supple. JVP 16 cm. Carotids 2+ bilat; no bruits. No lymphadenopathy or thyromegaly appreciated. Cor: PMI nondisplaced. Regular rate & rhythm. 2/6 HSM LLSB. Lungs: Clear Abdomen: Soft, nontender, nondistended. No hepatosplenomegaly. No bruits or masses. Good bowel sounds. Extremities: No  cyanosis, clubbing, rash, edema Neuro: Alert & orientedx3, cranial nerves grossly intact. moves all 4 extremities w/o difficulty. Affect pleasant   Telemetry   Atrial flutter rate 80s (personally reviewed)  Labs    CBC Recent Labs    08/29/24 0519 08/30/24 0421  WBC 5.7 4.4  HGB 8.2* 8.3*  HCT 25.0* 24.4*  MCV 97.7 96.4  PLT 82* 109*   Basic Metabolic Panel Recent Labs    88/79/74 0519 08/29/24 1330 08/29/24 1753 08/30/24 0421  NA 135   < > 133* 135  K 4.0   < > 3.9 3.4*  CL 100   < > 97* 97*  CO2 20*   < > 22 24  GLUCOSE 124*   < > 161* 116*  BUN 22   < > 23 22  CREATININE 1.49*   < > 1.45* 1.40*  CALCIUM  7.7*   < > 7.7* 8.1*  MG 2.4  --   --  2.0   < > = values in this interval not displayed.   Liver Function Tests No results for input(s): AST, ALT, ALKPHOS, BILITOT, PROT, ALBUMIN  in the last 72 hours. No results for input(s): LIPASE, AMYLASE in the last 72 hours. Cardiac Enzymes No results for input(s): CKTOTAL, CKMB, CKMBINDEX, TROPONINI in the last 72 hours.  BNP: BNP (last 3 results) No results for input(s): BNP in the last 8760 hours.  ProBNP (last 3 results) No results for input(s):  PROBNP in the last 8760 hours.   D-Dimer No results for input(s): DDIMER in the last 72 hours. Hemoglobin A1C No results for input(s): HGBA1C in the last 72 hours. Fasting Lipid Panel No results for input(s): CHOL, HDL, LDLCALC, TRIG, CHOLHDL, LDLDIRECT in the last 72 hours. Thyroid  Function Tests No results for input(s): TSH, T4TOTAL, T3FREE, THYROIDAB in the last 72 hours.  Invalid input(s): FREET3  Other results:   Imaging    DG CHEST PORT 1 VIEW Result Date: 08/30/2024 EXAM: 1 VIEW(S) XRAY OF THE CHEST 08/30/2024 09:22:00 AM COMPARISON: Today. CLINICAL HISTORY: [id number] PICC (peripherally inserted central catheter) in place [id number] PICC (peripherally inserted central catheter) in place. FINDINGS: LINES,  TUBES AND DEVICES: Stable left-sided PICC line. Stable right internal jugular sheath. LUNGS AND PLEURA: Stable right pleural effusion with associated atelectasis or infiltrate. No pneumothorax. HEART AND MEDIASTINUM: Stable cardiomegaly. BONES AND SOFT TISSUES: No acute osseous abnormality. IMPRESSION: 1. Stable right pleural effusion with associated atelectasis or infiltrate. Electronically signed by: Lynwood Seip MD 08/30/2024 09:27 AM EST RP Workstation: [id number]   DG CHEST PORT 1 VIEW Result Date: 08/30/2024 EXAM: 1 VIEW(S) XRAY OF THE CHEST 08/30/2024 06:11:00 AM COMPARISON: 08/29/2024 CLINICAL HISTORY: Status post cardiac surgery [id number] FINDINGS: LINES, TUBES AND DEVICES: Right IJ sheath stable in place. Left PICC (peripherally inserted central catheter) line with tip at brachiocephalic SVC (superior vena cava) junction. LUNGS AND PLEURA: Moderate right pleural effusion with adjacent right basilar opacity. Small left pleural effusion. Mild pulmonary edema. Right basilar atelectasis. No pneumothorax. HEART AND MEDIASTINUM: Stable cardiomegaly. Prior median sternotomy and aortic and mitral valve replacement noted. BONES AND SOFT TISSUES: No acute osseous abnormality. IMPRESSION: 1. Moderate right pleural effusion with adjacent right basilar opacity and small left pleural effusion. 2. Mild pulmonary edema. Electronically signed by: Waddell Calk MD 08/30/2024 08:39 AM EST RP Workstation: [id number]   US  EKG Site Rite Result Date: 08/30/2024 If Site Rite image not attached, placement could not be confirmed due to current cardiac rhythm.  DG CHEST PORT 1 VIEW Result Date: 08/29/2024 CLINICAL DATA:  Central line EXAM: PORTABLE CHEST 1 VIEW COMPARISON:  Chest x-ray 08/29/2024 FINDINGS: There is a new left upper extremity PICC. The distal tip is seen at the brachiocephalic SVC junction with the distal 13 mm of the catheter projecting cranially. Right-sided central venous catheter tip projects over the  proximal SVC, unchanged. Patient is status post cardiac surgery/valve replacements. The heart is enlarged. There small bilateral pleural effusions similar to the prior study. Patchy opacities in the lung bases persists. No acute fractures are seen. IMPRESSION: 1. New left upper extremity PICC with the distal tip at the brachiocephalic SVC junction with the distal 13 mm of the catheter projecting cranially. Recommend repositioning. 2. Stable cardiomegaly and small bilateral pleural effusions. 3. Stable bibasilar opacities. Electronically Signed   By: Greig Pique M.D.   On: 08/29/2024 22:43   US  EKG SITE RITE Result Date: 08/29/2024 If Site Rite image not attached, placement could not be confirmed due to current cardiac rhythm.    Medications:     Scheduled Medications:  acetaminophen   1,000 mg Oral Q6H   Or   acetaminophen  (TYLENOL ) oral liquid 160 mg/5 mL  1,000 mg Per Tube Q6H   aspirin  EC  325 mg Oral Daily   Or   aspirin   324 mg Per Tube Daily   bisacodyl   10 mg Oral Daily   Or   bisacodyl   10 mg Rectal Daily   Chlorhexidine  Gluconate Cloth  6 each Topical Daily   docusate sodium   200 mg Oral Daily   feeding supplement  237 mL Oral BID BM   insulin  aspart  0-24 Units Subcutaneous Q4H   ipratropium-albuterol   3 mL Nebulization Q4H   melatonin  3 mg Oral QHS   pantoprazole   40 mg Oral Daily   scopolamine   1 patch Transdermal Q72H   sodium chloride  flush  10-40 mL Intracatheter Q12H   sodium chloride  flush  3 mL Intravenous Q12H   traZODone   50 mg Oral QHS    Infusions:  sodium chloride  Stopped (08/29/24 1814)   albumin  human Stopped (08/27/24 0721)   amiodarone  30 mg/hr (08/30/24 0900)   clevidipine  2 mg/hr (08/28/24 2154)   epinephrine  Stopped (08/30/24 0451)   furosemide  (LASIX ) 200 mg in dextrose  5 % 100 mL (2 mg/mL) infusion 15 mg/hr (08/30/24 0900)   milrinone  0.375 mcg/kg/min (08/30/24 0900)   nitroGLYCERIN      norepinephrine  (LEVOPHED ) Adult infusion Stopped  (08/27/24 2147)   potassium chloride  50 mL/hr at 08/30/24 0900   potassium chloride      promethazine  (PHENERGAN ) injection (IM or IVPB) Stopped (08/29/24 0409)    PRN Medications: sodium chloride , albumin  human, albuterol , morphine  injection, ondansetron  (ZOFRAN ) IV, mouth rinse, oxyCODONE , promethazine  (PHENERGAN ) injection (IM or IVPB), sodium chloride  flush, sodium chloride  flush, traMADol     Assessment/Plan   1. Rheumatic heart disease: Severe MR and severe AI on 9/25 TEE with EF 55-60%. S/p AVR with 21 mm Inspiris valve, MVR with 27 mm Mitris valve and LAA ligation by Dr. Daniel on 08/26/24. Post-op echo (11/20) showed LV EF 65%, mild RV dilation with normal systolic function but there is likely severe tricuspid regurgitation, s/p bioprosthetic MV replacement with mean gradient 4, s/p bioprosthetic AV replacement with mean gradient 20.   2. Post-op cardiogenic shock/RV failure:  Echo post-op on 11/20 showed LV EF 65%, mild RV dilation with normal systolic function but there is likely severe tricuspid regurgitation, s/p bioprosthetic MV replacement with mean gradient 4, s/p bioprosthetic AV replacement with mean gradient 20.  I suspect severe TR/RV failure is driving volume overload. CVP still 18 on my read.  Co-ox 64% on epinephrine  1 + milrinone  0.375, dropped to 45% off epinephrine .   - Continue milrinone  0.375 and restart epinephrine  1 while diuresing.  Can try again to wean off when she is better diuresed.   - Continue Lasix  gtt 15 mg/hr and will give metolazone  5 mg x 1.  Replace K.  3. Atrial flutter: Atypical, noted post-op. She is currently in AFL.  - Start amiodarone  gtt.  - anticoagulation when ok per surgery. Heparin  prophylaxis for now.  4. AKI: Creatinine up 1.2 => 1.49 => 1.4.  Hopefully diuresis will help by lower renal venous pressure.  5. Anemia: Post-op.  Transfuse hgb < 8. 8.3 today.   CRITICAL CARE Performed by: Ezra Shuck  Total critical care time: 40  minutes  Critical care time was exclusive of separately billable procedures and treating other patients.  Critical care was necessary to treat or prevent imminent or life-threatening deterioration.  Critical care was time spent personally by me on the following activities: development of treatment plan with patient and/or surrogate as well as nursing, discussions with consultants, evaluation of patient's response to treatment, examination of patient, obtaining history from patient or surrogate, ordering and performing treatments and interventions, ordering and review of laboratory studies, ordering and review of radiographic studies, pulse oximetry and re-evaluation of patient's condition.   Length of Stay:  4  Ezra Shuck, MD  08/30/2024, 10:03 AM  Advanced Heart Failure Team Pager [phone] (M-F; 7a - 5p)  Please contact [facility] for night-coverage after hours (5p -7a ) and weekends on [email or web address]

## 2024-08-30 NOTE — Progress Notes (Signed)
 Physical Therapy Treatment Patient Details Name: Katie Jacobson MRN: [id number] DOB: 05-Nov-1955 Today's Date: 08/30/2024   History of Present Illness Pt is 68 yo presenting to [facility] on 11/17 for scheduled AVR/MVR. PMH: breast cancer (triple negative), rheumatic valve disease, severe AI, sever mitral regurgitation, HTN, HLD    PT Comments  Pt is slowly progressing towards goals. Pt was very high level and independent prior to hospitalization. Currently pt is CGA +2 for sit to stand, 50 ft of gait with seated rest break at 25 ft and requires 15L partial rebreather for O2 sats of 99% during gait. Spouse present and supportive; able to provide 24/7 support. Due to pt current functional status, home set up and available assistance at home recommending skilled physical therapy services > 3 hours/day in order to address strength, balance and functional mobility to decrease risk for falls, injury, immobility, skin break down and re-hospitalization.      If plan is discharge home, recommend the following: Assist for transportation;Help with stairs or ramp for entrance;Assistance with cooking/housework;A little help with walking and/or transfers     Equipment Recommendations  Rolling walker (2 wheels);BSC/3in1    Recommendations for Other Services Rehab consult     Precautions / Restrictions Precautions Precautions: Sternal Precaution Booklet Issued: No Recall of Precautions/Restrictions: Intact Restrictions Weight Bearing Restrictions Per Provider Order: Yes Other Position/Activity Restrictions: sternal precautions     Mobility  Bed Mobility   General bed mobility comments: Pt on BSC at beginning of session and recliner at end of session    Transfers Overall transfer level: Needs assistance Equipment used: Rolling walker (2 wheels) Transfers: Sit to/from Stand Sit to Stand: +2 safety/equipment, Contact guard assist           General transfer comment: 2 person available due to pt  anxiety. Pt is CGA for sit to stand wtih verbal cues for sternal precautions frequently for sit to stand from [facility] and recliner.    Ambulation/Gait Ambulation/Gait assistance: Contact guard assist, +2 safety/equipment Gait Distance (Feet): 50 Feet Assistive device: Rolling walker (2 wheels) Gait Pattern/deviations: Step-through pattern, Decreased stride length Gait velocity: decreased Gait velocity interpretation: <1.31 ft/sec, indicative of household ambulator   General Gait Details: +2 for CGA short distance gait; limited by to +2 due to severe anxiety when pt realized she did not have a chair follow stating I always have a chair follow Pt educated that this will not always be the case and we need to start weaning it but RN was asked to get chair for follow. Pt on partial rebreather at 15L on tank for gait wtih O2 sats 99%. 1x seated rest break after ~ 25 ft when pt realized there was no chair follow      Balance Overall balance assessment: Needs assistance Sitting-balance support: Bilateral upper extremity supported, Feet supported Sitting balance-Leahy Scale: Fair Sitting balance - Comments: seated EOB   Standing balance support: Bilateral upper extremity supported, During functional activity, Reliant on assistive device for balance Standing balance-Leahy Scale: Poor Standing balance comment: Benefits from BUE support        Communication Communication Communication: No apparent difficulties  Cognition Arousal: Alert Behavior During Therapy: Anxious   PT - Cognitive impairments: No apparent impairments     Following commands: Intact      Cueing Cueing Techniques: Verbal cues     General Comments General comments (skin integrity, edema, etc.): Vital signs stable this session during gait. Partial rebreather for gait due to [facility] when at rest.  Pertinent Vitals/Pain Pain Assessment Pain Assessment: Faces Faces Pain Scale: Hurts little more Facial Expression:  Tense Body Movements: Protection Muscle Tension: Tense, rigid Compliance with ventilator (intubated pts.): N/A Vocalization (extubated pts.): Sighing, moaning CPOT Total: 4 Pain Location: sternum Pain Descriptors / Indicators: Aching, Discomfort Pain Intervention(s): Monitored during session     PT Goals (current goals can now be found in the care plan section) Acute Rehab PT Goals Patient Stated Goal: to improve mobility/decrease pain PT Goal Formulation: With patient Time For Goal Achievement: 09/11/24 Potential to Achieve Goals: Good Progress towards PT goals: Progressing toward goals    Frequency    Min 2X/week      PT Plan  Updated discharge recommendations       AM-PAC PT 6 Clicks Mobility   Outcome Measure  Help needed turning from your back to your side while in a flat bed without using bedrails?: A Little Help needed moving from lying on your back to sitting on the side of a flat bed without using bedrails?: A Little Help needed moving to and from a bed to a chair (including a wheelchair)?: A Little Help needed standing up from a chair using your arms (e.g., wheelchair or bedside chair)?: A Little Help needed to walk in hospital room?: A Lot Help needed climbing 3-5 steps with a railing? : A Lot 6 Click Score: 16    End of Session Equipment Utilized During Treatment: Gait belt Activity Tolerance: Patient tolerated treatment well;Patient limited by pain;Patient limited by fatigue Patient left: in chair;with call bell/phone within reach;with nursing/sitter in room;with family/visitor present Nurse Communication: Mobility status PT Visit Diagnosis: Unsteadiness on feet (R26.81);Other abnormalities of gait and mobility (R26.89);Muscle weakness (generalized) (M62.81);Pain Pain - part of body:  (sternum)     Time: 8767-8694 PT Time Calculation (min) (ACUTE ONLY): 33 min  Charges:    $Therapeutic Activity: 23-37 mins PT General Charges $$ ACUTE PT VISIT:  1 Visit                     Dorothyann Maier, DPT, CLT  Acute Rehabilitation Services Office: [phone] (Secure chat preferred)    Dorothyann VEAR Maier 08/30/2024, 5:19 PM

## 2024-08-30 NOTE — Progress Notes (Signed)
  Inpatient Rehab Admissions Coordinator :  Per therapy recommendations, patient was screened for CIR candidacy by Ottie Glazier RN MSN.  At this time patient appears to be a potential candidate for CIR. I will place a rehab consult per protocol for full assessment. Please call me with any questions.  Ottie Glazier RN MSN Admissions Coordinator [phone]

## 2024-08-30 NOTE — Progress Notes (Signed)
 PHARMACY - ANTICOAGULATION CONSULT NOTE  Pharmacy Consult for heparin  Indication: atrial fibrillation  Allergies  Allergen Reactions   Sulfa Antibiotics Other (See Comments)    UNKNOWN    Patient Measurements: Height: 5' 1 (154.9 cm) Weight: 108.3 kg (238 lb 12.1 oz) IBW/kg (Calculated) : 47.8 HEPARIN  DW (KG): (P) 71.1  Vital Signs: Temp: 97.5 F (36.4 C) (11/21 0748) Temp Source: Axillary (11/21 0748) BP: 121/68 (11/21 0700) Pulse Rate: 76 (11/21 0748)  Labs: Recent Labs    08/28/24 0507 08/28/24 1728 08/29/24 0105 08/29/24 0519 08/29/24 1330 08/29/24 1753 08/30/24 0421  HGB 9.0*  --  8.5* 8.2*  --   --  8.3*  HCT 26.6*  --  25.0* 25.0*  --   --  24.4*  PLT 88*  --   --  82*  --   --  109*  CREATININE 1.29*   < >  --  1.49* 1.38* 1.45* 1.40*   < > = values in this interval not displayed.    Estimated Creatinine Clearance: 45.5 mL/min (A) (by C-G formula based on SCr of 1.4 mg/dL (H)).   Medical History: Past Medical History:  Diagnosis Date   Allergy    Arthritis    Breast cancer of upper-outer quadrant of right female breast (HCC) 11/14/2014   Treated with chemotherapy and radiation (diagnosed again in March 2024 in both breasts)   Heart murmur    History of chemotherapy    finished chemo 02/03/2015   History of kidney stones    History of seizures    as a child - unknown cause - states was never on anticonvulsants   Hypertension    states under control with meds., has been on med. x years   Mitral regurgitation    Mitral valve prolapse 1990   Rheumatic mitral valve with moderate to severe MR by echo 05/31/2024 => TEE suggests rheumatic mitral valve with severe calcified leaflets and severe mitral regurgitation but also severe aortic regurgitation   Osteoporosis    Personal history of chemotherapy    Personal history of radiation therapy    Port-A-Cath in place 01/24/2023   Seasonal allergies     Medications:  Scheduled:   acetaminophen   1,000  mg Oral Q6H   Or   acetaminophen  (TYLENOL ) oral liquid 160 mg/5 mL  1,000 mg Per Tube Q6H   aspirin  EC  325 mg Oral Daily   Or   aspirin   324 mg Per Tube Daily   bisacodyl   10 mg Oral Daily   Or   bisacodyl   10 mg Rectal Daily   Chlorhexidine  Gluconate Cloth  6 each Topical Daily   docusate sodium   200 mg Oral Daily   feeding supplement  237 mL Oral BID BM   insulin  aspart  0-24 Units Subcutaneous Q4H   ipratropium-albuterol   3 mL Nebulization Q4H   melatonin  3 mg Oral QHS   pantoprazole   40 mg Oral Daily   scopolamine   1 patch Transdermal Q72H   sodium chloride  flush  10-40 mL Intracatheter Q12H   sodium chloride  flush  3 mL Intravenous Q12H   traZODone   50 mg Oral QHS    Assessment: 67 YOF s/p bioprosthetic MVR, bioprosthetic AVR, and left atrial appendage clipping on 08/26/24. Patient with new post-op atrial flutter on 08/29/24. Discussed with TCTS, plan to initiate heparin  infusion due to ongoing atrial flutter. Patient not on anticoagulation prior to admission. Patient started on DVT prophylaxis with subcutaneous heparin , last dose administered 08/30/24 at  0500. Pharmacy consulted for heparin  dosing.   08/30/24: Hgb 8.3, PLT increased to 109. CT output 30 cc in prior 24 hours. EPW removed at ~0830 today. Per TCTS, okay to start heparin  infusion 4 hours after wires are removed.   Goal of Therapy:  Heparin  level 0.3-0.7 units/ml Monitor platelets by anticoagulation protocol: Yes   Plan:  Heparin  700 units/hr - no bolus per TCTS - Start heparin  infusion at 1300 Monitor heparin  level 6 hours after initiation Monitor heparin  level, CBC, and s/sx of bleeding daily  Morna Breach, PharmD PGY2 Cardiology Pharmacy Resident 08/30/2024 8:31 AM

## 2024-08-30 NOTE — Progress Notes (Signed)
 Peripherally Inserted Central Catheter Placement  The IV Nurse has discussed with the patient and/or persons authorized to consent for the patient, the purpose of this procedure and the potential benefits and risks involved with this procedure.  The benefits include less needle sticks, lab draws from the catheter, and the patient may be discharged home with the catheter. Risks include, but not limited to, infection, bleeding, blood clot (thrombus formation), and puncture of an artery; nerve damage and irregular heartbeat and possibility to perform a PICC exchange if needed/ordered by physician.  Alternatives to this procedure were also discussed.  Bard Power PICC patient education guide, fact sheet on infection prevention and patient information card has been provided to patient /or left at bedside.    PICC Placement Documentation  PICC Triple Lumen 08/30/24 Left Basilic 43 cm 0 cm (Active)  Indication for Insertion or Continuance of Line Vasoactive infusions 08/30/24 0906  Exposed Catheter (cm) 0 cm 08/30/24 0906  Site Assessment Clean, Dry, Intact 08/30/24 0906  Lumen #1 Status Saline locked;Blood return noted 08/30/24 0906  Lumen #2 Status Saline locked;Flushed;Blood return noted 08/30/24 0906  Lumen #3 Status Saline locked;Flushed;Blood return noted 08/30/24 0906  Dressing Type Transparent;Securing device 08/30/24 0906  Dressing Status Antimicrobial disc/dressing in place;Clean, Dry, Intact 08/30/24 0906  Line Care Connections checked and tightened 08/30/24 0906  Line Adjustment (NICU/IV Team Only) No 08/30/24 0906  Dressing Intervention New dressing 08/30/24 0906  Dressing Change Due 09/06/24 08/30/24 0906    PICC exchanged by Remonia Brought, RN. Informed consent obtained on 08/29/24.   Katie Jacobson 08/30/2024, 9:08 AM

## 2024-08-30 NOTE — Progress Notes (Signed)
 PICC Insertion: Followed up with radiology for the addendum interpretation for PICC tip placement on 11/21 to notate PICC tip confirmation. Line was exchanged this morning. Spoke with Radiologist and will place her addendum.

## 2024-08-30 NOTE — Progress Notes (Signed)
 POCUS Right chest Minimal pleural fluid <50cc   Maude FORBES Banner ACNP-BC [facility] Pulmonary/Critical Care Pager # [phone] OR # [phone] if no answer

## 2024-08-30 NOTE — Progress Notes (Signed)
 NAME:  Katie Jacobson, MRN:  [id number], DOB:  Apr 21, 1956, LOS: 4 ADMISSION DATE:  08/26/2024, CONSULTATION DATE:  08/26/24 REFERRING MD:  Daniel, CHIEF COMPLAINT:  s/p AVR MVR LAA clipping  History of Present Illness:  [name] 2/2 rheumatic valve dz, tobacco use, breast ca s/p R mastectomy and radiation, HTN, HLD  who presented to [facility] 08/26/24 for planned AVR MVR LAA clipping.  Admitted to ICU post op as per pre-op plan PCCM consulted in this setting    Xclamp 232 Total pump 177 EBL 1250 Product 1 Plt 746 cellsaver   Pertinent  Medical History  Rheumatic valve disease Severe AI Severe mitral regurg  HTN HLD Tobacco use  Breast ca s/p R mastectomy, radiation  Significant Hospital Events: Including procedures, antibiotic start and stop dates in addition to other pertinent events   08/26/24 AVR MVR LAA clipping  11/18 extubate 11/19 add milrinone , cont epi. Intractable Nausea and vomiting. Treated w/ Emend  after failing other antiemetics Adv HF called to consult. Recommended continuing epinephrine , and milrinone , added IV lasix  and gtt at 15mg /hr , started on amiodarone  for afib/flutter w/ RVR. Scr increased some. PICC placed but top angled up.  11/21 no further nausea.  Remains on high flow oxygen.  Still volume overloaded.  Tolerating IV diuresis.   Interim History / Subjective:  No acute distress.  Denies any nausea currently.  Only real complaint right now is multiple phone calls, also persistent nasal drip  Objective    Blood pressure 121/68, pulse 76, temperature (!) 97.5 F (36.4 C), temperature source Axillary, resp. rate (!) 23, height 5' 1 (1.549 m), weight 108.3 kg, last menstrual period 10/10/1996, SpO2 97%. CVP:  [13 mmHg-20 mmHg] 19 mmHg CO:  [4.4 L/min] 4.4 L/min CI:  [2.2 L/min/m2] 2.2 L/min/m2  FiO2 (%):  [60 %-100 %] 70 %   Intake/Output Summary (Last 24 hours) at 08/30/2024 0826 Last data filed at 08/30/2024 0700 Gross  per 24 hour  Intake 1606.64 ml  Output 3230 ml  Net -1623.36 ml   Filed Weights   08/28/24 0650 08/29/24 0500 08/30/24 0500  Weight: 106.1 kg 108.2 kg 108.3 kg    Examination: General 68 year old female patient sitting up in bed she is in no acute distress this morning HEENT normal cephalic atraumatic right IJ triple-lumen catheter in place dressing appears clean dry and intact.  Does appear to still be some mild jugular distention on the left side.  Mucous membranes are moist Pulmonary decreased both bases, currently no accessory use noted however still requiring 69% FiO2 on 35 L/min heated high flow. Portable chest x-ray was personally reviewed that shows persistent right basilar volume loss which appears to be a mix of atelectasis and effusion Cardiac regular rate and rhythm with soft systolic heart murmur, sternal wound VAC intact Abdomen soft nontender Extremities with diffuse edema pulses warm brisk cap refill Neuro awake and oriented without focal deficits appreciated  Resolved problem list  Endotracheally intubated  Assessment and Plan   H/o Severe MR with mod stenosis and severe AI. Now s/p MVR, AVR and LAA clip 11/17 LBBB c/b Acute HFrEF & cardiogenic shock Hx HTN & Hx HLD  Off epinephrine   Co. oximetry about 45% after epinephrine  stopped Plan Cont tele Increase ambulation  Post-op IS and pulm hygiene  Lasix  gtt as directed by HF team  Cont milrinone  as directed by adv HF (0.375 mcg/kg/min) Clevidipine  to keep SBP < 150 Cont asa and  statin  Cont multi modal pain control  Holding antihypertensive and GDMT given shock state    Acute hypoxic respiratory failure -initially secondary to pulmonary edema, with ongoing oxygen needs likely exacerbated by right sided atelectasis and pleural effusion, complicated  by Mixed obstructive/restrictive lung dz  H/o tobacco abuse  O2 needs remain high at 69% FiO2 with 35 L flow oxygen Plan Cont wean O2 for sats > 92% Continue  scheduled bronchodilators IS, mobilize, add flutter Diurese with IV Lasix  drip Might need to consider therapeutic thoracentesis Cont pulse ox  Smoking cessation   Intractable nausea and vomiting   Slight improvement with emend  dose  Plan Cont scop patch for now  PRN compazine , phenergan   Diet carb modified  AKI Scr improved w/ diuretics and management of HF; 1.45-->1.4 Plan MAP goal > 65 Renal dose meds Strict I&O  Fluid and Electrolyte imbalance: hypokalemia Plan Replace as needed   Hyperglycemia  A1c= 5.5 Plan Ssi  Expected ABLA and thrombocytopenia  Plan Follow the CBC  Hx breast ca s/p R mastectomy, radiation  Plan F/u OP      I personally  spent 32 minutes  on this patient which included: review of medical records, nursing notes, progress notes, evaluation, interpretation of lab data and diagnostic studies, taking independent history, performing exam, documenting plan, ordering diagnostics and interventions for the following critical care issues: Acute respiratory failure with the following interventions which included: titration of high flow oxygen

## 2024-08-30 NOTE — Progress Notes (Signed)
 TCTS Evening Rounds:  Hemodynamically stable on Epi 1, milrinone  0.375   Co-ox 56.1 this afternoon.  HFNC 15L with sats 97%.  Diuresing very well on lasix  drip 15/hr. -2100 cc so far today.  BMET pending this evening.

## 2024-08-30 NOTE — Plan of Care (Signed)
   Problem: Education: Goal: Knowledge of General Education information will improve Description: Including pain rating scale, medication(s)/side effects and non-pharmacologic comfort measures Outcome: Progressing   Problem: Clinical Measurements: Goal: Ability to maintain clinical measurements within normal limits will improve Outcome: Progressing Goal: Will remain free from infection Outcome: Progressing

## 2024-08-31 ENCOUNTER — Inpatient Hospital Stay (HOSPITAL_COMMUNITY)

## 2024-08-31 DIAGNOSIS — I519 Heart disease, unspecified: Secondary | ICD-10-CM

## 2024-08-31 DIAGNOSIS — J811 Chronic pulmonary edema: Secondary | ICD-10-CM | POA: Diagnosis not present

## 2024-08-31 DIAGNOSIS — I5021 Acute systolic (congestive) heart failure: Secondary | ICD-10-CM | POA: Diagnosis not present

## 2024-08-31 DIAGNOSIS — Z9889 Other specified postprocedural states: Secondary | ICD-10-CM | POA: Diagnosis not present

## 2024-08-31 DIAGNOSIS — J90 Pleural effusion, not elsewhere classified: Secondary | ICD-10-CM | POA: Diagnosis not present

## 2024-08-31 DIAGNOSIS — N179 Acute kidney failure, unspecified: Secondary | ICD-10-CM | POA: Diagnosis not present

## 2024-08-31 DIAGNOSIS — Z952 Presence of prosthetic heart valve: Secondary | ICD-10-CM | POA: Diagnosis not present

## 2024-08-31 DIAGNOSIS — I351 Nonrheumatic aortic (valve) insufficiency: Secondary | ICD-10-CM

## 2024-08-31 DIAGNOSIS — Z48812 Encounter for surgical aftercare following surgery on the circulatory system: Secondary | ICD-10-CM | POA: Diagnosis not present

## 2024-08-31 DIAGNOSIS — I352 Nonrheumatic aortic (valve) stenosis with insufficiency: Secondary | ICD-10-CM | POA: Diagnosis not present

## 2024-08-31 DIAGNOSIS — E8770 Fluid overload, unspecified: Secondary | ICD-10-CM

## 2024-08-31 DIAGNOSIS — R918 Other nonspecific abnormal finding of lung field: Secondary | ICD-10-CM | POA: Diagnosis not present

## 2024-08-31 DIAGNOSIS — Z452 Encounter for adjustment and management of vascular access device: Secondary | ICD-10-CM | POA: Diagnosis not present

## 2024-08-31 DIAGNOSIS — I340 Nonrheumatic mitral (valve) insufficiency: Secondary | ICD-10-CM | POA: Diagnosis not present

## 2024-08-31 LAB — BASIC METABOLIC PANEL WITH GFR
Anion gap: 16 — ABNORMAL HIGH (ref 5–15)
Anion gap: 18 — ABNORMAL HIGH (ref 5–15)
BUN: 16 mg/dL (ref 8–23)
BUN: 18 mg/dL (ref 8–23)
CO2: 31 mmol/L (ref 22–32)
CO2: 27 mmol/L (ref 22–32)
Calcium: 8.6 mg/dL — ABNORMAL LOW (ref 8.9–10.3)
Calcium: 8.2 mg/dL — ABNORMAL LOW (ref 8.9–10.3)
Chloride: 88 mmol/L — ABNORMAL LOW (ref 98–111)
Chloride: 87 mmol/L — ABNORMAL LOW (ref 98–111)
Creatinine, Ser: 1.31 mg/dL — ABNORMAL HIGH (ref 0.44–1.00)
Creatinine, Ser: 1.38 mg/dL — ABNORMAL HIGH (ref 0.44–1.00)
GFR, Estimated: 45 mL/min — ABNORMAL LOW (ref >60)
GFR, Estimated: 42 mL/min — ABNORMAL LOW (ref >60)
Glucose, Bld: 114 mg/dL — ABNORMAL HIGH (ref 70–99)
Glucose, Bld: 162 mg/dL — ABNORMAL HIGH (ref 70–99)
Potassium: 2.7 mmol/L — CL (ref 3.5–5.1)
Potassium: 3.8 mmol/L (ref 3.5–5.1)
Sodium: 135 mmol/L (ref 135–145)
Sodium: 132 mmol/L — ABNORMAL LOW (ref 135–145)

## 2024-08-31 LAB — GLUCOSE, CAPILLARY
Glucose-Capillary: 122 mg/dL — ABNORMAL HIGH (ref 70–99)
Glucose-Capillary: 215 mg/dL — ABNORMAL HIGH (ref 70–99)
Glucose-Capillary: 79 mg/dL (ref 70–99)
Glucose-Capillary: 82 mg/dL (ref 70–99)
Glucose-Capillary: 88 mg/dL (ref 70–99)
Glucose-Capillary: 93 mg/dL (ref 70–99)
Glucose-Capillary: 97 mg/dL (ref 70–99)

## 2024-08-31 LAB — COOXEMETRY PANEL
Carboxyhemoglobin: 1.8 % — ABNORMAL HIGH (ref 0.5–1.5)
Carboxyhemoglobin: 1.3 % (ref 0.5–1.5)
Methemoglobin: <0.7 % (ref 0.0–1.5)
Methemoglobin: 1.4 % (ref 0.0–1.5)
O2 Saturation: 96.4 %
O2 Saturation: 60.9 %
Total hemoglobin: 9.1 g/dL — ABNORMAL LOW (ref 12.0–16.0)
Total hemoglobin: 9.5 g/dL — ABNORMAL LOW (ref 12.0–16.0)

## 2024-08-31 LAB — CBC
HCT: 23.5 % — ABNORMAL LOW (ref 36.0–46.0)
Hemoglobin: 8.1 g/dL — ABNORMAL LOW (ref 12.0–15.0)
MCH: 32.3 pg (ref 26.0–34.0)
MCHC: 34.5 g/dL (ref 30.0–36.0)
MCV: 93.6 fL (ref 80.0–100.0)
Platelets: 140 K/uL — ABNORMAL LOW (ref 150–400)
RBC: 2.51 MIL/uL — ABNORMAL LOW (ref 3.87–5.11)
RDW: 15.8 % — ABNORMAL HIGH (ref 11.5–15.5)
WBC: 7.6 K/uL (ref 4.0–10.5)
nRBC: 4.5 % — ABNORMAL HIGH (ref 0.0–0.2)

## 2024-08-31 LAB — PREPARE RBC (CROSSMATCH)

## 2024-08-31 LAB — MAGNESIUM: Magnesium: 1.9 mg/dL (ref 1.7–2.4)

## 2024-08-31 MED ORDER — SODIUM CHLORIDE 0.9% IV SOLUTION: Freq: Once | INTRAVENOUS | Status: DC

## 2024-08-31 MED ORDER — POTASSIUM CHLORIDE 10 MEQ/50ML IV SOLN
10.0000 meq | INTRAVENOUS | Status: AC
  Administered 2024-08-31 (×3): 10 meq via INTRAVENOUS
  Filled 2024-08-31 (×3): qty 50

## 2024-08-31 MED ORDER — LACTULOSE ENEMA
300.0000 mL | Freq: Once | ORAL | Status: DC
  Filled 2024-08-31: qty 300

## 2024-08-31 MED ORDER — POTASSIUM CHLORIDE CRYS ER 20 MEQ PO TBCR: 40.0000 meq | EXTENDED_RELEASE_TABLET | ORAL | Status: DC

## 2024-08-31 MED ORDER — MAGNESIUM SULFATE 2 GM/50ML IV SOLN
2.0000 g | Freq: Once | INTRAVENOUS | Status: AC
  Administered 2024-08-31: 2 g via INTRAVENOUS
  Filled 2024-08-31: qty 50

## 2024-08-31 MED ORDER — POTASSIUM CHLORIDE 10 MEQ/50ML IV SOLN
10.0000 meq | INTRAVENOUS | Status: AC
  Administered 2024-08-31 (×6): 10 meq via INTRAVENOUS
  Filled 2024-08-31 (×6): qty 50

## 2024-08-31 MED ORDER — METOCLOPRAMIDE HCL 5 MG/ML IJ SOLN
10.0000 mg | Freq: Four times a day (QID) | INTRAMUSCULAR | Status: AC
  Administered 2024-08-31 – 2024-09-01 (×4): 10 mg via INTRAVENOUS
  Filled 2024-08-31 (×4): qty 2

## 2024-08-31 MED ORDER — PANTOPRAZOLE SODIUM 40 MG IV SOLR
40.0000 mg | INTRAVENOUS | Status: DC
  Administered 2024-08-31 – 2024-09-01 (×2): 40 mg via INTRAVENOUS
  Filled 2024-08-31 (×2): qty 10

## 2024-08-31 MED ORDER — INSULIN ASPART 100 UNIT/ML IJ SOLN
0.0000 [IU] | INTRAMUSCULAR | Status: DC
  Administered 2024-09-02: 1 [IU] via SUBCUTANEOUS

## 2024-08-31 MED ORDER — FENTANYL CITRATE (PF) 50 MCG/ML IJ SOSY
25.0000 ug | PREFILLED_SYRINGE | INTRAMUSCULAR | Status: DC | PRN
  Administered 2024-08-31: 25 ug via INTRAVENOUS
  Filled 2024-08-31: qty 1

## 2024-08-31 NOTE — Progress Notes (Signed)
 Patient ID: Katie Jacobson, female   DOB: Aug 03, 1956, 68 y.o.   MRN: [id number]     Advanced Heart Failure Rounding Note  Cardiologist: Alm Clay, MD  Chief Complaint: CHF Subjective:    Remains on NE 0.375 and epi 1 . Off NE. Co-ox 61%  Having N/v. Concern for ileus   Lasix  stopped this am due to hypokalemia  K 2.7   CVP 13   Post-op echo showed LV EF 65%, mild RV dilation with normal systolic function but there is likely severe tricuspid regurgitation, s/p bioprosthetic MV replacement with mean gradient 4, s/p bioprosthetic AV replacement with mean gradient 20.    Objective:   Weight Range: 103.3 kg Body mass index is 43.04 kg/m.   Vital Signs:   Temp:  [96.1 F (35.6 C)-98.6 F (37 C)] 98.4 F (36.9 C) (11/22 1045) Pulse Rate:  [81-104] 82 (11/22 1045) Resp:  [17-37] 20 (11/22 1045) BP: (108-147)/(55-120) 112/67 (11/22 1030) SpO2:  [67 %-100 %] 95 % (11/22 1045) FiO2 (%):  [40 %-50 %] 40 % (11/22 0829) Weight:  [103.3 kg] 103.3 kg (11/22 0500) Last BM Date :  (PTA)  Weight change: Filed Weights   08/29/24 0500 08/30/24 0500 08/31/24 0500  Weight: 108.2 kg 108.3 kg 103.3 kg    Intake/Output:   Intake/Output Summary (Last 24 hours) at 08/31/2024 1049 Last data filed at 08/31/2024 1045 Gross per 24 hour  Intake 1735.25 ml  Output 6721 ml  Net -4985.75 ml      Physical Exam    General:  Sitting up in bed. Feels bloated + cough HEENT: normal Neck: supple.JVP to jaw  Cor: Irregular rate & rhythm. No rubs, gallops or murmurs. Lungs: decreased at bases Abdomen: soft, nontender, + mildly distended.Hypoactive bowel sounds. Extremities: no cyanosis, clubbing, rash, edema Neuro: alert & orientedx3, cranial nerves grossly intact. moves all 4 extremities w/o difficulty. Affect pleasant    Telemetry   AFL 80s Personally reviewed   Labs    CBC Recent Labs    08/30/24 0421 08/31/24 0441  WBC 4.4 7.6  HGB 8.3* 8.1*  HCT 24.4* 23.5*  MCV 96.4  93.6  PLT 109* 140*   Basic Metabolic Panel Recent Labs    88/78/74 2049 08/31/24 0441  NA 130* 135  K 3.0* 2.7*  CL 89* 88*  CO2 26 31  GLUCOSE 247* 114*  BUN 17 16  CREATININE 1.36* 1.31*  CALCIUM  8.1* 8.6*  MG 1.7 1.9   Liver Function Tests No results for input(s): AST, ALT, ALKPHOS, BILITOT, PROT, ALBUMIN  in the last 72 hours. No results for input(s): LIPASE, AMYLASE in the last 72 hours. Cardiac Enzymes No results for input(s): CKTOTAL, CKMB, CKMBINDEX, TROPONINI in the last 72 hours.  BNP: BNP (last 3 results) No results for input(s): BNP in the last 8760 hours.  ProBNP (last 3 results) No results for input(s): PROBNP in the last 8760 hours.   D-Dimer No results for input(s): DDIMER in the last 72 hours. Hemoglobin A1C No results for input(s): HGBA1C in the last 72 hours. Fasting Lipid Panel No results for input(s): CHOL, HDL, LDLCALC, TRIG, CHOLHDL, LDLDIRECT in the last 72 hours. Thyroid  Function Tests No results for input(s): TSH, T4TOTAL, T3FREE, THYROIDAB in the last 72 hours.  Invalid input(s): FREET3  Other results:   Imaging    DG Abd 1 View Result Date: 08/31/2024 CLINICAL DATA:  Vomiting. EXAM: ABDOMEN - 1 VIEW COMPARISON:  08/28/2024 FINDINGS: Gas distended small bowel again visualized in the  abdomen with loops measuring up to 3.4 cm diameter today compared to 2.8 cm measured previously. No substantial colonic gas evident. Rectal temperature probe noted. IMPRESSION: Gas distended small bowel loops in the abdomen with loops measuring up to 3.4 cm diameter today compared to 2.8 cm previously. Features may reflect ileus or developing obstruction. Electronically Signed   By: Camellia Candle M.D.   On: 08/31/2024 09:30   DG CHEST PORT 1 VIEW Result Date: 08/31/2024 EXAM: 1 VIEW(S) XRAY OF THE CHEST 08/31/2024 04:37:00 AM COMPARISON: 08/30/2024 CLINICAL HISTORY: Status post cardiac surgery [id number]  FINDINGS: LINES, TUBES AND DEVICES: Left PICC in place with tip at superior cavoatrial junction. Right internal jugular vascular sheath removed. LUNGS AND PLEURA: Small to moderate right pleural effusion with vail-like opacification over both lungs. Small left pleural effusion. Mild pulmonary edema. No pneumothorax. HEART AND MEDIASTINUM: Unchanged cardiomegaly. Atrial appendage clip noted. Status post aortic and mitral valve replacement BONES AND SOFT TISSUES: Post median sternotomy. No acute osseous abnormality. IMPRESSION: 1. Increased veil-like . opacification over the right mid and right lower lung and the left lower lung which likely reflects posterior layering pleural effusions. 2. Mild pulmonary edema. Electronically signed by: Waddell Calk MD 08/31/2024 06:23 AM EST RP Workstation: [id number]     Medications:     Scheduled Medications:  aspirin  EC  325 mg Oral Daily   Or   aspirin   324 mg Per Tube Daily   bisacodyl   10 mg Oral Daily   Or   bisacodyl   10 mg Rectal Daily   Chlorhexidine  Gluconate Cloth  6 each Topical Daily   heparin  injection (subcutaneous)  5,000 Units Subcutaneous Q8H   insulin  aspart  0-6 Units Subcutaneous Q4H   ipratropium-albuterol   3 mL Nebulization Q4H   metoCLOPramide  (REGLAN ) injection  10 mg Intravenous Q6H   pantoprazole  (PROTONIX ) IV  40 mg Intravenous Q24H   scopolamine   1 patch Transdermal Q72H   sodium chloride  flush  10-40 mL Intracatheter Q12H   sodium chloride  flush  10-40 mL Intracatheter Q12H   sodium chloride  flush  3 mL Intravenous Q12H    Infusions:  amiodarone  30 mg/hr (08/31/24 1000)   epinephrine  1 mcg/min (08/31/24 1000)   magnesium  sulfate bolus IVPB 50 mL/hr at 08/31/24 1000   milrinone  0.375 mcg/kg/min (08/31/24 1000)   norepinephrine  (LEVOPHED ) Adult infusion Stopped (08/27/24 2147)   potassium chloride  10 mEq (08/31/24 1018)   promethazine  (PHENERGAN ) injection (IM or IVPB) Stopped (08/30/24 1220)    PRN  Medications: albuterol , fentaNYL  (SUBLIMAZE ) injection, ondansetron  (ZOFRAN ) IV, mouth rinse, promethazine  (PHENERGAN ) injection (IM or IVPB), sodium chloride  flush, sodium chloride  flush, sodium chloride  flush    Assessment/Plan   1. Rheumatic heart disease: Severe MR and severe AI on 9/25 TEE with EF 55-60%. S/p AVR with 21 mm Inspiris valve, MVR with 27 mm Mitris valve and LAA ligation by Dr. Macgregor Aeschliman on 08/26/24. Post-op echo (11/20) showed LV EF 65%, mild RV dilation with normal systolic function but there is likely severe tricuspid regurgitation, s/p bioprosthetic MV replacement with mean gradient 4, s/p bioprosthetic AV replacement with mean gradient 20.   2. Post-op cardiogenic shock/RV failure:  Echo post-op on 11/20 showed LV EF 65%, mild RV dilation with normal systolic function but there is likely severe tricuspid regurgitation, s/p bioprosthetic MV replacement with mean gradient 4, s/p bioprosthetic AV replacement with mean gradient 20.  - Volume improving with lasix  gtt and milrinone /epi support - Agree with holding lasix  gtt until K replaced then can continue diuresis  -  Continue milrinone  0.375 an epinephrine  1 while diuresing.  Can try again to wean off when she is better diuresed.   3. Atrial flutter: Atypical, noted post-op. She is currently in AFL.  - Continue amiodarone  gtt.  - anticoagulation when ok per surgery. Heparin  prophylaxis for now.  4. AKI: Creatinine up 1.2 => 1.49 => 1.4 -> 1.3   5. Anemia: Post-op.  Transfuse hgb < 8. 8.1 today.  6. Hypokalemia - supp k 7. Post-op ileus - continue reglan . Mobilize as tolerated   CRITICAL CARE Performed by: Toribio Fuel  Total critical care time: 37 minutes  Critical care time was exclusive of separately billable procedures and treating other patients.  Critical care was necessary to treat or prevent imminent or life-threatening deterioration.  Critical care was time spent personally by me on the following activities:  development of treatment plan with patient and/or surrogate as well as nursing, discussions with consultants, evaluation of patient's response to treatment, examination of patient, obtaining history from patient or surrogate, ordering and performing treatments and interventions, ordering and review of laboratory studies, ordering and review of radiographic studies, pulse oximetry and re-evaluation of patient's condition.   Length of Stay: 5  Toribio Fuel, MD  08/31/2024, 10:49 AM  Advanced Heart Failure Team Pager [phone] (M-F; 7a - 5p)  Please contact [facility] for night-coverage after hours (5p -7a ) and weekends on [email or web address]

## 2024-08-31 NOTE — Progress Notes (Signed)
 TCTS Evening Rounds:  Hemodynamically stable in atrial flutter with controlled rate 80's.  Sats 100% 3L.  NG inserted today but has not put out much. Only 150 cc since 10 am. Says she had a small BM, passing some gas. NG in the back of her throat is making her gag.  UO ok off lasix .  Still replacing K+  BMET pending this pm.

## 2024-08-31 NOTE — Progress Notes (Signed)
 5 Days Post-Op Procedure(s) (LRB): REPLACEMENT, AORTIC VALVE, OPEN, UTILIZING INSPIRIS RESILIA  AORTIC VALVE (N/A) REPLACEMENT, MITRAL VALVE, UTILIZING MITRIS RESILIA MITRAL VALVE (N/A) CLIPPING, LEFT ATRIAL APPENDAGE UTILIZING ATRICURE PRO 40 (N/A) ECHOCARDIOGRAM, TRANSESOPHAGEAL, INTRAOPERATIVE (N/A) Subjective:  Complains of persistent N/V overnight. Drank some tea this am and that came back up. No flatus or BM. Abdomen feels boated but no specific pain. Had two enemas yesterday with no result.   Objective: Vital signs in last 24 hours: Temp:  [96.1 F (35.6 C)-98.6 F (37 C)] 97.7 F (36.5 C) (11/22 0900) Pulse Rate:  [76-104] 92 (11/22 0900) Cardiac Rhythm: Normal sinus rhythm;Atrial flutter (11/22 0730) Resp:  [17-37] 26 (11/22 0900) BP: (108-150)/(55-120) 135/78 (11/22 0900) SpO2:  [67 %-100 %] 96 % (11/22 0900) FiO2 (%):  [40 %-50 %] 40 % (11/21 1543) Weight:  [103.3 kg] 103.3 kg (11/22 0500)  Hemodynamic parameters for last 24 hours: CVP:  [11 mmHg-89 mmHg] 13 mmHg  Intake/Output from previous day: 11/21 0701 - 11/22 0700 In: 1711.4 [I.V.:974.3; IV Piggyback:737.2] Out: 6386 [Urine:6386] Intake/Output this shift: Total I/O In: 127.2 [I.V.:75.8; IV Piggyback:51.5] Out: 705 [Urine:580; Emesis/NG output:125]  General appearance: alert, cooperative, and looks uncomfortable Neurologic: intact Heart: regular rate and rhythm Lungs: clear to auscultation bilaterally Abdomen: distended, non-tender; no bowel sounds Extremities: minimal edema in ankles  Wound: Prevena in place  Lab Results: Recent Labs    08/30/24 0421 08/31/24 0441  WBC 4.4 7.6  HGB 8.3* 8.1*  HCT 24.4* 23.5*  PLT 109* 140*   BMET:  Recent Labs    08/30/24 2049 08/31/24 0441  NA 130* 135  K 3.0* 2.7*  CL 89* 88*  CO2 26 31  GLUCOSE 247* 114*  BUN 17 16  CREATININE 1.36* 1.31*  CALCIUM  8.1* 8.6*    PT/INR: No results for input(s): LABPROT, INR in the last 72  hours. ABG    Component Value Date/Time   PHART 7.415 08/29/2024 0105   HCO3 22.7 08/29/2024 0105   TCO2 24 08/29/2024 0105   ACIDBASEDEF 2.0 08/29/2024 0105   O2SAT 96.4 08/31/2024 0441   CBG (last 3)  Recent Labs    08/31/24 0010 08/31/24 0438 08/31/24 0735  GLUCAP 215* 122* 79   CXR: some opacification over lower lung fields, likely a combination of some effusion and atelectasis.  KUB: layered loops of air filled small bowel without distension, most likely an ileus.  Assessment/Plan: S/P Procedure(s) (LRB): REPLACEMENT, AORTIC VALVE, OPEN, UTILIZING INSPIRIS RESILIA  AORTIC VALVE (N/A) REPLACEMENT, MITRAL VALVE, UTILIZING MITRIS RESILIA MITRAL VALVE (N/A) CLIPPING, LEFT ATRIAL APPENDAGE UTILIZING ATRICURE PRO 40 (N/A) ECHOCARDIOGRAM, TRANSESOPHAGEAL, INTRAOPERATIVE (N/A)  POD 5 Hemodynamically stable on milrinone  0.375 and epi 1. Co-ox 96 this am so that is not accurate. AHF team following. CVP 13 this am.  I think she has an ileus related to narcotics, hypokalemia. She has persistent N/V and NG is indicated to decompress the stomach until this resolves to decrease the risk of aspiration. Keep NPO. Stop lasix  for now until K+ corrected. Minimize narcotics. Reglan  added today.   Continue IS, OOB, mobilize as tolerated.  Discussed with CCM this am.   LOS: 5 days    Katie Jacobson 08/31/2024

## 2024-08-31 NOTE — Progress Notes (Signed)
 NAME:  Katie Jacobson, MRN:  [id number], DOB:  [birth year], LOS: 5 ADMISSION DATE:  08/26/2024, CONSULTATION DATE:  08/26/24 REFERRING MD:  Daniel, CHIEF COMPLAINT:  s/p AVR MVR LAA clipping  History of Present Illness:  [name] 2/2 rheumatic valve dz, tobacco use, breast ca s/p R mastectomy and radiation, HTN, HLD  who presented to [facility] 08/26/24 for planned AVR MVR LAA clipping.  Admitted to ICU post op as per pre-op plan PCCM consulted in this setting  Xclamp 232 Total pump 177 EBL 1250 Product 1 Plt 746 cellsaver   Pertinent  Medical History  Rheumatic valve disease Severe AI Severe mitral regurg  HTN HLD Tobacco use  Breast ca s/p R mastectomy, radiation  Significant Hospital Events: Including procedures, antibiotic start and stop dates in addition to other pertinent events   08/26/24 AVR MVR LAA clipping  11/18 extubate 11/19 add milrinone , cont epi. Intractable Nausea and vomiting. Treated w/ Emend  after failing other antiemetics Adv HF called to consult. Recommended continuing epinephrine , and milrinone , added IV lasix  and gtt at 15mg /hr , started on amiodarone  for afib/flutter w/ RVR. Scr increased some. PICC placed but top angled up.  11/21 Weaned off high flow oxygen. Tolerating IV diuresis. Did not tolerate weaning off epi. Recurrent nausea/ vomiting.    Interim History / Subjective:  Overnight she had ongoing emesis and nausea. No BM despite 2 enemas yesterday. Down to 9L [place].   Objective    Blood pressure (!) 145/74, pulse 93, temperature (!) 96.3 F (35.7 C), resp. rate 19, height 5' 1 (1.549 m), weight 103.3 kg, last menstrual period 10/10/1996, SpO2 96%. CVP:  [11 mmHg-89 mmHg] 13 mmHg  FiO2 (%):  [40 %-70 %] 40 %   Intake/Output Summary (Last 24 hours) at 08/31/2024 0715 Last data filed at 08/31/2024 9361 Gross per 24 hour  Intake 1623.9 ml  Output 6386 ml  Net -4762.1 ml   Filed Weights   08/29/24 0500 08/30/24 0500  08/31/24 0500  Weight: 108.2 kg 108.3 kg 103.3 kg    Examination: General elderly woman sitting up in bed in NAD HEENT [place]/AT, eyes anicteric Pulmonary distant R breath sounds, decreased in left base. No accessory muscle use or tachypnea. Cardiac S1S2, RRR Abdomen soft,NT Extremities +edema, TED hose Neuro awake, alert, answering questions appropriately  I/O -4.7L , net -3.7L for admission  Coox 96%; suspect drawn incorrectly K+ 2.7 BUN 16 Cr 1.31 WBC 7.6 H/H 8.1/23.5  platelets 140   Resolved problem list  Endotracheally intubated   Assessment and Plan   H/o severe MR with mod stenosis and severe AI. S/p MVR, AVR and LAA clip on 11/17 LBBB  Acute HFrEF & cardiogenic shock Atrial flutter, controlled rate -epi & milrinone  per AHF; epi was restarted yesterday after drop in coox -con't amiodarone  -heparin  not needed for A-flutter with LAA clip; confirmed occlusion on her TEE in OR -lasix  gtt on hold to correct electrolytes -GDMT on hold with AKI and shock -aspirin , statin -progress mobility- has been limited with nausea the past few days -diet not progressing due to nausea -pain control per protocol  Acute hypoxic respiratory failure -initially secondary to pulmonary edema, with ongoing oxygen needs likely exacerbated by right sided atelectasis. Very small R pleural effusion-- has likely had a significant component of shunt from atelectasis. H/o tobacco abuse  -con't weaning oxygen-- was saturating well with [place] on her chin -pulmonary hygiene  Intractable nausea and vomiting due to  ileus; has complicated her progression this week Constipation- refractory to 2 enemas yesterday -KUB -NGT  -hold additional enemas for now -mobility as able -correcting electrolytes, hopefully can limit pain meds -likely limited benefit from relistor since she hasn't had opiates in a few days -restart reglan   AKI -renally dose meds, avoid nephrotoxic meds -strict I/O -maintain  adequate perfusion  hypokalemia -repleting IV due to nausea, recheck this afternoon  Hyperglycemia, A1c= 5.5 - SSI PRN -goal BG <180  Expected ABLA and thrombocytopenia  -no current indication for transfusion  Hx breast ca s/p R mastectomy, radiation  -OP follow up  Hx HTN & Hx HLD  -hold PTA enalapril , Toprol , chlorthalidone   This patient is critically ill with multiple organ system failure which requires frequent high complexity decision making, assessment, support, evaluation, and titration of therapies. This was completed through the application of advanced monitoring technologies and extensive interpretation of multiple databases. During this encounter critical care time was devoted to patient care services described in this note for 41 minutes.  Katie SHAUNNA Gaskins, DO 08/31/24 7:20 AM [facility]  For contact information, see Amion. If no response to pager, please call PCCM consult pager. After hours, 7PM- 7AM, please call Elink.

## 2024-08-31 NOTE — Plan of Care (Signed)
   Problem: Clinical Measurements: Goal: Will remain free from infection Outcome: Progressing Goal: Diagnostic test results will improve Outcome: Progressing Goal: Respiratory complications will improve Outcome: Progressing Goal: Cardiovascular complication will be avoided Outcome: Progressing   Problem: Activity: Goal: Risk for activity intolerance will decrease Outcome: Progressing

## 2024-08-31 NOTE — Progress Notes (Signed)
 IP rehab admissions - I met with patient briefly; she was not feeling well today.  Will see again tomorrow to discuss inpatient rehab and rehab options.  I did call her daughter and her husband, but no response and could not leave a message.  [phone]

## 2024-09-01 DIAGNOSIS — I5021 Acute systolic (congestive) heart failure: Secondary | ICD-10-CM | POA: Diagnosis not present

## 2024-09-01 DIAGNOSIS — E8770 Fluid overload, unspecified: Secondary | ICD-10-CM | POA: Diagnosis not present

## 2024-09-01 DIAGNOSIS — Z952 Presence of prosthetic heart valve: Secondary | ICD-10-CM | POA: Diagnosis not present

## 2024-09-01 DIAGNOSIS — I340 Nonrheumatic mitral (valve) insufficiency: Secondary | ICD-10-CM | POA: Diagnosis not present

## 2024-09-01 DIAGNOSIS — I4892 Unspecified atrial flutter: Secondary | ICD-10-CM | POA: Diagnosis not present

## 2024-09-01 DIAGNOSIS — I352 Nonrheumatic aortic (valve) stenosis with insufficiency: Secondary | ICD-10-CM | POA: Diagnosis not present

## 2024-09-01 DIAGNOSIS — I351 Nonrheumatic aortic (valve) insufficiency: Secondary | ICD-10-CM | POA: Diagnosis not present

## 2024-09-01 LAB — COOXEMETRY PANEL
Carboxyhemoglobin: 1.4 % (ref 0.5–1.5)
Carboxyhemoglobin: 2.1 % — ABNORMAL HIGH (ref 0.5–1.5)
Methemoglobin: 1.9 % — ABNORMAL HIGH (ref 0.0–1.5)
Methemoglobin: 1.3 % (ref 0.0–1.5)
O2 Saturation: 50 %
O2 Saturation: 54.5 %
Total hemoglobin: 8.8 g/dL — ABNORMAL LOW (ref 12.0–16.0)
Total hemoglobin: 9.3 g/dL — ABNORMAL LOW (ref 12.0–16.0)

## 2024-09-01 LAB — BASIC METABOLIC PANEL WITH GFR
Anion gap: 16 — ABNORMAL HIGH (ref 5–15)
BUN: 21 mg/dL (ref 8–23)
CO2: 28 mmol/L (ref 22–32)
Calcium: 8 mg/dL — ABNORMAL LOW (ref 8.9–10.3)
Chloride: 90 mmol/L — ABNORMAL LOW (ref 98–111)
Creatinine, Ser: 1.53 mg/dL — ABNORMAL HIGH (ref 0.44–1.00)
GFR, Estimated: 38 mL/min — ABNORMAL LOW (ref >60)
Glucose, Bld: 149 mg/dL — ABNORMAL HIGH (ref 70–99)
Potassium: 3.7 mmol/L (ref 3.5–5.1)
Sodium: 134 mmol/L — ABNORMAL LOW (ref 135–145)

## 2024-09-01 LAB — MAGNESIUM: Magnesium: 2.2 mg/dL (ref 1.7–2.4)

## 2024-09-01 LAB — BPAM RBC
Blood Product Expiration Date: 202511292359
ISSUE DATE / TIME: 202511222353
Unit Type and Rh: 7300

## 2024-09-01 LAB — TYPE AND SCREEN
ABO/RH(D): B POS
Antibody Screen: NEGATIVE
Unit division: 0

## 2024-09-01 LAB — CBC
HCT: 25.7 % — ABNORMAL LOW (ref 36.0–46.0)
Hemoglobin: 8.8 g/dL — ABNORMAL LOW (ref 12.0–15.0)
MCH: 30.9 pg (ref 26.0–34.0)
MCHC: 34.2 g/dL (ref 30.0–36.0)
MCV: 90.2 fL (ref 80.0–100.0)
Platelets: 130 K/uL — ABNORMAL LOW (ref 150–400)
RBC: 2.85 MIL/uL — ABNORMAL LOW (ref 3.87–5.11)
RDW: 18 % — ABNORMAL HIGH (ref 11.5–15.5)
WBC: 7.6 K/uL (ref 4.0–10.5)
nRBC: 10.5 % — ABNORMAL HIGH (ref 0.0–0.2)

## 2024-09-01 LAB — GLUCOSE, CAPILLARY
Glucose-Capillary: 107 mg/dL — ABNORMAL HIGH (ref 70–99)
Glucose-Capillary: 117 mg/dL — ABNORMAL HIGH (ref 70–99)
Glucose-Capillary: 103 mg/dL — ABNORMAL HIGH (ref 70–99)
Glucose-Capillary: 127 mg/dL — ABNORMAL HIGH (ref 70–99)
Glucose-Capillary: 128 mg/dL — ABNORMAL HIGH (ref 70–99)

## 2024-09-01 MED ORDER — POTASSIUM CHLORIDE 10 MEQ/50ML IV SOLN
10.0000 meq | INTRAVENOUS | Status: AC
  Administered 2024-09-01 (×3): 10 meq via INTRAVENOUS
  Filled 2024-09-01 (×2): qty 50

## 2024-09-01 MED ORDER — POTASSIUM CHLORIDE CRYS ER 20 MEQ PO TBCR: 40.0000 meq | EXTENDED_RELEASE_TABLET | Freq: Once | ORAL | Status: DC

## 2024-09-01 NOTE — Progress Notes (Signed)
 Patient ID: Katie Jacobson, female   DOB: 1956/08/29, 68 y.o.   MRN: [id number]     Advanced Heart Failure Rounding Note  Cardiologist: Alm Clay, MD  Chief Complaint: CHF Subjective:    Remains on NE 0.375 and epi 1 . Off NE. Co-ox 55%  Off lasix  due to ileus and hypokalemia  Feels much better. Had 3 BMS overnight. Hungry  Remains in rate controlled AFL   Scr 1.38 -> 1.5 K 3.7   Post-op echo showed LV EF 65%, mild RV dilation with normal systolic function but there is likely severe tricuspid regurgitation, s/p bioprosthetic MV replacement with mean gradient 4, s/p bioprosthetic AV replacement with mean gradient 20.    Objective:   Weight Range: 103.1 kg Body mass index is 42.95 kg/m.   Vital Signs:   Temp:  [97.4 F (36.3 C)-99.1 F (37.3 C)] 97.4 F (36.3 C) (11/23 1200) Pulse Rate:  [85-92] 87 (11/23 1200) Resp:  [17-40] 29 (11/23 1200) BP: (70-132)/(39-91) 105/54 (11/23 1200) SpO2:  [87 %-100 %] 94 % (11/23 1200) Weight:  [103.1 kg] 103.1 kg (11/23 0545) Last BM Date : 09/01/24  Weight change: Filed Weights   08/30/24 0500 08/31/24 0500 09/01/24 0545  Weight: 108.3 kg 103.3 kg 103.1 kg    Intake/Output:   Intake/Output Summary (Last 24 hours) at 09/01/2024 1421 Last data filed at 09/01/2024 1200 Gross per 24 hour  Intake 1595.16 ml  Output 605 ml  Net 990.16 ml      Physical Exam    General:  Sitting up in bed. No resp difficulty HEENT: normal Neck: supple.JVP to ear Cor: Irregular rate & rhythm. No rubs, gallops or murmurs. Lungs: clear Abdomen: soft, nontender, nondistended.Good bowel sounds. Extremities: no cyanosis, clubbing, rash,2+ edema Neuro: alert & orientedx3, cranial nerves grossly intact. moves all 4 extremities w/o difficulty. Affect pleasant   Telemetry   AFL 80s Personally reviewed   Labs    CBC Recent Labs    08/31/24 0441 09/01/24 0500  WBC 7.6 7.6  HGB 8.1* 8.8*  HCT 23.5* 25.7*  MCV 93.6 90.2  PLT 140*  130*   Basic Metabolic Panel Recent Labs    88/77/74 0441 08/31/24 1600 09/01/24 0500  NA 135 132* 134*  K 2.7* 3.8 3.7  CL 88* 87* 90*  CO2 31 27 28   GLUCOSE 114* 162* 149*  BUN 16 18 21   CREATININE 1.31* 1.38* 1.53*  CALCIUM  8.6* 8.2* 8.0*  MG 1.9  --  2.2   Liver Function Tests No results for input(s): AST, ALT, ALKPHOS, BILITOT, PROT, ALBUMIN  in the last 72 hours. No results for input(s): LIPASE, AMYLASE in the last 72 hours. Cardiac Enzymes No results for input(s): CKTOTAL, CKMB, CKMBINDEX, TROPONINI in the last 72 hours.  BNP: BNP (last 3 results) No results for input(s): BNP in the last 8760 hours.  ProBNP (last 3 results) No results for input(s): PROBNP in the last 8760 hours.   D-Dimer No results for input(s): DDIMER in the last 72 hours. Hemoglobin A1C No results for input(s): HGBA1C in the last 72 hours. Fasting Lipid Panel No results for input(s): CHOL, HDL, LDLCALC, TRIG, CHOLHDL, LDLDIRECT in the last 72 hours. Thyroid  Function Tests No results for input(s): TSH, T4TOTAL, T3FREE, THYROIDAB in the last 72 hours.  Invalid input(s): FREET3  Other results:   Imaging    No results found.    Medications:     Scheduled Medications:  sodium chloride    Intravenous Once   aspirin  EC  325 mg Oral Daily   Or   aspirin   324 mg Per Tube Daily   bisacodyl   10 mg Oral Daily   Or   bisacodyl   10 mg Rectal Daily   Chlorhexidine  Gluconate Cloth  6 each Topical Daily   heparin  injection (subcutaneous)  5,000 Units Subcutaneous Q8H   insulin  aspart  0-6 Units Subcutaneous Q4H   ipratropium-albuterol   3 mL Nebulization Q4H   pantoprazole  (PROTONIX ) IV  40 mg Intravenous Q24H   scopolamine   1 patch Transdermal Q72H   sodium chloride  flush  10-40 mL Intracatheter Q12H   sodium chloride  flush  10-40 mL Intracatheter Q12H   sodium chloride  flush  3 mL Intravenous Q12H    Infusions:  amiodarone  30  mg/hr (09/01/24 1200)   epinephrine  1 mcg/min (09/01/24 1200)   milrinone  0.375 mcg/kg/min (09/01/24 1200)   promethazine  (PHENERGAN ) injection (IM or IVPB) Stopped (08/31/24 1154)    PRN Medications: albuterol , fentaNYL  (SUBLIMAZE ) injection, ondansetron  (ZOFRAN ) IV, mouth rinse, promethazine  (PHENERGAN ) injection (IM or IVPB), sodium chloride  flush, sodium chloride  flush, sodium chloride  flush    Assessment/Plan   1. Rheumatic heart disease: Severe MR and severe AI on 9/25 TEE with EF 55-60%. S/p AVR with 21 mm Inspiris valve, MVR with 27 mm Mitris valve and LAA ligation by Dr. Odile Veloso on 08/26/24. Post-op echo (11/20) showed LV EF 65%, mild RV dilation with normal systolic function but there is likely severe tricuspid regurgitation, s/p bioprosthetic MV replacement with mean gradient 4, s/p bioprosthetic AV replacement with mean gradient 20.   2. Post-op cardiogenic shock/RV failure:  Echo post-op on 11/20 showed LV EF 65%, mild RV dilation with normal systolic function but there is likely severe tricuspid regurgitation, s/p bioprosthetic MV replacement with mean gradient 4, s/p bioprosthetic AV replacement with mean gradient 20.  - Co-ox remains marginal on milrinone  and epi - Remains volume overloaded.  - Will need to restart  3. Atrial flutter: Atypical, noted post-op. She is currently in AFL.  - Continue amiodarone  gtt.  - anticoagulation when ok per surgery. Heparin  prophylaxis for now. - Can consider eventual DC-CV  4. AKI: Creatinine up 1.2 => 1.49 => 1.4 -> 1.3  -> 1.5 5. Anemia: Post-op.  Transfuse hgb < 8. 8.8 today.  6. Hypokalemia - supp k again today 7. Post-op ileus - improved with Reglan   CRITICAL CARE Performed by: Toribio Fuel  Total critical care time: 40 minutes  Critical care time was exclusive of separately billable procedures and treating other patients.  Critical care was necessary to treat or prevent imminent or life-threatening deterioration.  Critical  care was time spent personally by me on the following activities: development of treatment plan with patient and/or surrogate as well as nursing, discussions with consultants, evaluation of patient's response to treatment, examination of patient, obtaining history from patient or surrogate, ordering and performing treatments and interventions, ordering and review of laboratory studies, ordering and review of radiographic studies, pulse oximetry and re-evaluation of patient's condition.   Length of Stay: 6  Toribio Fuel, MD  09/01/2024, 2:21 PM  Advanced Heart Failure Team Pager [phone] (M-F; 7a - 5p)  Please contact [facility] for night-coverage after hours (5p -7a ) and weekends on [email or web address]

## 2024-09-01 NOTE — Progress Notes (Signed)
 IP rehab admissions - I met with patient and her husband at the bedside today.  I gave them rehab booklets and explained inpatient rehab process.  Patient is doing well with therapies.  She is open to CIR or to going home with [facility] if appropriate.  Her husband is retired and stays home.  Her daughter works from patient's home and can also assist.  One of my partners will follow up tomorrow.  [phone]

## 2024-09-01 NOTE — PMR Pre-admission (Shared)
 PMR Admission Coordinator Pre-Admission Assessment  Patient: Katie Jacobson is an 68 y.o., female MRN: [id number] DOB: [birth year] Height: 5' 1 (154.9 cm) Weight: 103.1 kg  Insurance Information HMO: ***    PPO: ***     PCP: ***     IPA: ***     80/20: ***     OTHER: *** PRIMARY: [employer]      Policy#: [id number]      Subscriber: self CM Name: ***      Phone#: ***     Fax#: *** Pre-Cert#: ***      Employer: Retired Benefits:  Phone #: [phone]     Name: PIERRETTE Gemma. Date: ***     Deduct: ***      Out of Pocket Max: ***      Life Max: *** CIR: ***      SNF: *** Outpatient: ***     Co-Pay: *** Home Health: ***      Co-Pay: *** DME: ***     Co-Pay: *** Providers: in network  SECONDARY:       Policy#:      Phone#:   Artist:       Phone#:   The Data Processing Manager" for patients in Inpatient Rehabilitation Facilities with attached "Privacy Act Statement-Health Care Records" was provided and verbally reviewed with: Patient  Emergency Contact Information Contact Information     Name Relation Home Work Mobile   [place] Spouse [phone]      Other Contacts     Name Relation Home Work Mobile   [place] Daughter [phone]  [phone]       Current Medical History  Patient Admitting Diagnosis: Debility, AVR/MVR  History of Present Illness: A 68 yo presenting to [facility] for scheduled AVR/MVR. PMH: breast cancer (triple negative), rheumatic valve disease, severe AI, sever mitral regurgitation, HTN, HLD.  Evaluations completed by PT/OT with recommendations for acute inpatient rehab admission.   Patient's medical record from [facility] has been reviewed by the rehabilitation admission coordinator and physician.  Past Medical History  Past Medical History:  Diagnosis Date   Allergy    Arthritis    Breast cancer of upper-outer quadrant of right female breast  (HCC) 11/14/2014   Treated with chemotherapy and radiation (diagnosed again in March 2024 in both breasts)   Heart murmur    History of chemotherapy    finished chemo 02/03/2015   History of kidney stones    History of seizures    as a child - unknown cause - states was never on anticonvulsants   Hypertension    states under control with meds., has been on med. x years   Mitral regurgitation    Mitral valve prolapse 1990   Rheumatic mitral valve with moderate to severe MR by echo 05/31/2024 => TEE suggests rheumatic mitral valve with severe calcified leaflets and severe mitral regurgitation but also severe aortic regurgitation   Osteoporosis    Personal history of chemotherapy    Personal history of radiation therapy    Port-A-Cath in place 01/24/2023   Seasonal allergies     Has the patient had major surgery during 100 days prior to admission? Yes  Family History   family history includes Breast cancer in her cousin; Cancer in her cousin; Colon polyps in her daughter; Diabetes in her mother, sister, and sister; Heart attack in her father; Heart disease in her mother; Hypertension in her mother; Kidney disease in her  brother; Mental retardation in her brother; Stroke in her mother.  Current Medications  Current Facility-Administered Medications:    0.9 %  sodium chloride  infusion (Manually program via Guardrails IV Fluids), , Intravenous, Once, Bartle, Dorise POUR, MD   albuterol  (PROVENTIL ) (2.5 MG/3ML) 0.083% nebulizer solution 2.5 mg, 2.5 mg, Inhalation, Q6H PRN, Raguel, Bailey S, PA-C   amiodarone  (NEXTERONE  PREMIX) 360-4.14 MG/200ML-% (1.8 mg/mL) IV infusion, 30 mg/hr, Intravenous, Continuous, Rolan Ezra RAMAN, MD, Last Rate: 16.67 mL/hr at 09/01/24 1400, 30 mg/hr at 09/01/24 1400   aspirin  EC tablet 325 mg, 325 mg, Oral, Daily, 325 mg at 09/01/24 1013 **OR** aspirin  chewable tablet 324 mg, 324 mg, Per Tube, Daily, Raguel Con RAMAN, PA-C, 324 mg at 08/31/24 1139   bisacodyl   (DULCOLAX) EC tablet 10 mg, 10 mg, Oral, Daily, 10 mg at 08/28/24 0951 **OR** bisacodyl  (DULCOLAX) suppository 10 mg, 10 mg, Rectal, Daily, Raguel Con S, PA-C, 10 mg at 08/31/24 1045   Chlorhexidine  Gluconate Cloth 2 % PADS 6 each, 6 each, Topical, Daily, Su, Con RAMAN, MD, 6 each at 09/01/24 1014   EPINEPHrine  (ADRENALIN ) 5 mg in NS 250 mL (0.02 mg/mL) premix infusion, 1 mcg/min, Intravenous, Continuous, Rolan Ezra RAMAN, MD, Last Rate: 3 mL/hr at 09/01/24 1400, 1 mcg/min at 09/01/24 1400   fentaNYL  (SUBLIMAZE ) injection 25 mcg, 25 mcg, Intravenous, Q3H PRN, Gretta Leita SQUIBB, DO, 25 mcg at 08/31/24 1009   heparin  injection 5,000 Units, 5,000 Units, Subcutaneous, Q8H, Su, Con RAMAN, MD, 5,000 Units at 09/01/24 9370   insulin  aspart (novoLOG ) injection 0-6 Units, 0-6 Units, Subcutaneous, Q4H, Clark, Laura P, DO   ipratropium-albuterol  (DUONEB) 0.5-2.5 (3) MG/3ML nebulizer solution 3 mL, 3 mL, Nebulization, Q4H, Bowser, Grace E, NP, 3 mL at 09/01/24 1157   milrinone  (PRIMACOR ) 20 MG/100 ML (0.2 mg/mL) infusion, 0.375 mcg/kg/min, Intravenous, Continuous, Gretta Leita SQUIBB, DO, Last Rate: 11.81 mL/hr at 09/01/24 1400, 0.375 mcg/kg/min at 09/01/24 1400   ondansetron  (ZOFRAN ) injection 4 mg, 4 mg, Intravenous, Q6H PRN, Raguel Con RAMAN, PA-C, 4 mg at 08/31/24 9451   Oral care mouth rinse, 15 mL, Mouth Rinse, PRN, Su, Con RAMAN, MD   pantoprazole  (PROTONIX ) injection 40 mg, 40 mg, Intravenous, Q24H, Clark, Laura P, DO, 40 mg at 09/01/24 1013   promethazine  (PHENERGAN ) 12.5 mg in sodium chloride  0.9 % 50 mL IVPB, 12.5 mg, Intravenous, Q6H PRN, Gretta Leita SQUIBB, DO, Stopped at 08/31/24 1154   scopolamine  (TRANSDERM-SCOP) 1 MG/3DAYS 1 mg, 1 patch, Transdermal, Q72H, Gretta Leita SQUIBB, DO, 1 mg at 08/31/24 1835   sodium chloride  flush (NS) 0.9 % injection 10-40 mL, 10-40 mL, Intracatheter, Q12H, Su, Con RAMAN, MD, 10 mL at 08/31/24 1036   sodium chloride  flush (NS) 0.9 % injection 10-40 mL, 10-40 mL, Intracatheter,  PRN, Su, Con RAMAN, MD   sodium chloride  flush (NS) 0.9 % injection 10-40 mL, 10-40 mL, Intracatheter, Q12H, Su, Bailey S, MD, 10 mL at 08/31/24 2200   sodium chloride  flush (NS) 0.9 % injection 10-40 mL, 10-40 mL, Intracatheter, PRN, Su, Con RAMAN, MD   sodium chloride  flush (NS) 0.9 % injection 3 mL, 3 mL, Intravenous, Q12H, Chambers, Bailey S, PA-C, 3 mL at 08/31/24 2200   sodium chloride  flush (NS) 0.9 % injection 3 mL, 3 mL, Intravenous, PRN, Raguel Con RAMAN, PA-C  Patients Current Diet:  Diet Order     None       Precautions / Restrictions Precautions Precautions: Sternal Precaution Booklet Issued: No Restrictions Weight Bearing Restrictions Per Provider Order: Yes  Other Position/Activity Restrictions: sternal precautions   Has the patient had 2 or more falls or a fall with injury in the past year? Yes  Prior Activity Level Community (5-7x/wk): Went out daily as desired, was driving.  Prior Functional Level Self Care: Did the patient need help bathing, dressing, using the toilet or eating? Independent  Indoor Mobility: Did the patient need assistance with walking from room to room (with or without device)? Independent  Stairs: Did the patient need assistance with internal or external stairs (with or without device)? Independent  Functional Cognition: Did the patient need help planning regular tasks such as shopping or remembering to take medications? Independent  Patient Information Are you of Hispanic, Latino/a,or Spanish origin?: A. No, not of Hispanic, Latino/a, or Spanish origin What is your race?: B. Black or African American Do you need or want an interpreter to communicate with a doctor or health care staff?: 0. No Patient information obtained via proxy : No  Patient's Response To:  Health Literacy and Transportation Is the patient able to respond to health literacy and transportation needs?: Yes Health Literacy - How often do you need to have someone help  you when you read instructions, pamphlets, or other written material from your doctor or pharmacy?: Never In the past 12 months, has lack of transportation kept you from medical appointments or from getting medications?: No In the past 12 months, has lack of transportation kept you from meetings, work, or from getting things needed for daily living?: No Health Literacy and Transportation obtained via proxy: No  Journalist, Newspaper / Equipment Home Equipment: None  Prior Device Use: Indicate devices/aids used by the patient prior to current illness, exacerbation or injury? None of the above  Current Functional Level Cognition  Orientation Level: Oriented X4    Extremity Assessment (includes Sensation/Coordination)  Upper Extremity Assessment: Generalized weakness  Lower Extremity Assessment: Defer to PT evaluation    ADLs  Overall ADL's : Needs assistance/impaired Eating/Feeding: Set up, Sitting Grooming: Set up, Sitting Upper Body Bathing: Sitting, Set up Lower Body Bathing: Minimal assistance, Sit to/from stand Upper Body Dressing : Set up, Sitting Lower Body Dressing: Minimal assistance, Sit to/from stand Toilet Transfer: Contact guard assist, Ambulation, Rolling walker (2 wheels) Functional mobility during ADLs: Contact guard assist, Rolling walker (2 wheels) General ADL Comments: Pt limited by decreased activity tolerance    Mobility  Overal bed mobility: Needs Assistance Bed Mobility: Supine to Sit Supine to sit: Min assist, HOB elevated, +2 for safety/equipment General bed mobility comments: Pt on BSC at beginning of session and recliner at end of session    Transfers  Overall transfer level: Needs assistance Equipment used: Rolling walker (2 wheels) Transfers: Sit to/from Stand Sit to Stand: +2 safety/equipment, Contact guard assist General transfer comment: 2 person available due to pt anxiety. Pt is CGA for sit to stand wtih verbal cues for sternal precautions  frequently for sit to stand from [facility] and recliner.    Ambulation / Gait / Stairs / Wheelchair Mobility  Ambulation/Gait Ambulation/Gait assistance: Contact guard assist, +2 safety/equipment Gait Distance (Feet): 50 Feet Assistive device: Rolling walker (2 wheels) Gait Pattern/deviations: Step-through pattern, Decreased stride length General Gait Details: +2 for CGA short distance gait; limited by to +2 due to severe anxiety when pt realized she did not have a chair follow stating I always have a chair follow Pt educated that this will not always be the case and we need to start weaning it but RN was  asked to get chair for follow. Pt on partial rebreather at 15L on tank for gait wtih O2 sats 99%. 1x seated rest break after ~ 25 ft when pt realized there was no chair follow Gait velocity: decreased Gait velocity interpretation: <1.31 ft/sec, indicative of household ambulator    Posture / Balance Dynamic Sitting Balance Sitting balance - Comments: seated EOB Balance Overall balance assessment: Needs assistance Sitting-balance support: Bilateral upper extremity supported, Feet supported Sitting balance-Leahy Scale: Fair Sitting balance - Comments: seated EOB Standing balance support: Bilateral upper extremity supported, During functional activity, Reliant on assistive device for balance Standing balance-Leahy Scale: Poor Standing balance comment: Benefits from BUE support    Special considerations/life events  Skin Post op surgical chest incision with dressing in place and Special service needs ***   Previous Home Environment (from acute therapy documentation) Living Arrangements: Spouse/significant other, Children Available Help at Discharge: Family, Available 24 hours/day Type of Home: House Home Layout: One level Home Access: Stairs to enter Entrance Stairs-Rails: None Entrance Stairs-Number of Steps: 2-3 Bathroom Shower/Tub: Automotive Engineer: Yes  Discharge Living Setting Plans for Discharge Living Setting: Patient's home, House, Lives with (comment) (Lives with husband and daughter) Type of Home at Discharge: House Discharge Home Layout: Two level Alternate Level Stairs-Rails: Left Alternate Level Stairs-Number of Steps: 6 (Six steps down to master bedroom) Discharge Home Access: Stairs to enter Entrance Stairs-Rails: Right, Left, Can reach both Entrance Stairs-Number of Steps: 4 to porch entry Discharge Bathroom Shower/Tub: Tub/shower unit, Curtain Discharge Bathroom Toilet: Handicapped height Discharge Bathroom Accessibility: Yes How Accessible: Accessible via walker  Social/Family/Support Systems Patient Roles: Spouse, Parent (Has husband and a daughter) Contact Information: Tenaya Hilyer - spouse - [phone] Anticipated Caregiver: husband and daughter Anticipated Caregiver's Contact Information: marjo Mayor - daughter - [phone] Ability/Limitations of Caregiver: Husband is retired and can assist.  Dtr works from home and is also Oceanographer Availability: 24/7 Discharge Plan Discussed with Primary Caregiver: Yes Is Caregiver In Agreement with Plan?: Yes Does Caregiver/Family have Issues with Lodging/Transportation while Pt is in Rehab?: No  Goals Patient/Family Goal for Rehab: PT/OT mod I and supervision goals Expected length of stay: 5-7 days Pt/Family Agrees to Admission and willing to participate: Yes Program Orientation Provided & Reviewed with Pt/Caregiver Including Roles  & Responsibilities: Yes  Decrease burden of Care through IP rehab admission: N/A  Possible need for SNF placement upon discharge: Not anticipated  Patient Condition: I have reviewed medical records from [facility], spoken with CM, and patient and spouse. I met with patient at the bedside for inpatient rehabilitation assessment.  Patient will benefit from ongoing PT and OT, can actively participate in 3  hours of therapy a day 5 days of the week, and can make measurable gains during the admission.  Patient will also benefit from the coordinated team approach during an Inpatient Acute Rehabilitation admission.  The patient will receive intensive therapy as well as Rehabilitation physician, nursing, social worker, and care management interventions.  Due to bladder management, bowel management, safety, skin/wound care, disease management, medication administration, pain management, and patient education the patient requires 24 hour a day rehabilitation nursing.  The patient is currently *** with mobility and basic ADLs.  Discharge setting and therapy post discharge at home with home health is anticipated.  Patient has agreed to participate in the Acute Inpatient Rehabilitation Program and will admit {Time; today/tomorrow:10263}.  Preadmission Screen Completed By:  Lovett CHRISTELLA Ropes, 09/01/2024 2:44 PM  ______________________________________________________________________   Discussed status with Dr. PIERRETTE on *** at *** and received approval for admission today.  Admission Coordinator:  Lovett CHRISTELLA Ropes, RN, time PIERRETTEPattricia ***   Assessment/Plan: Diagnosis: *** Does the need for close, 24 hr/day Medical supervision in concert with the patient's rehab needs make it unreasonable for this patient to be served in a less intensive setting? {yes_no_potentially:3041433} Co-Morbidities requiring supervision/potential complications: *** Due to {due un:6958565}, does the patient require 24 hr/day rehab nursing? {yes_no_potentially:3041433} Does the patient require coordinated care of a physician, rehab nurse, PT, OT, and SLP to address physical and functional deficits in the context of the above medical diagnosis(es)? {yes_no_potentially:3041433} Addressing deficits in the following areas: {deficits:3041436} Can the patient actively participate in an intensive therapy program of at least 3 hrs of therapy 5 days a week?  {yes_no_potentially:3041433} The potential for patient to make measurable gains while on inpatient rehab is {potential:3041437} Anticipated functional outcomes upon discharge from inpatient rehab: {functional outcomes:304600100} PT, {functional outcomes:304600100} OT, {functional outcomes:304600100} SLP Estimated rehab length of stay to reach the above functional goals is: *** Anticipated discharge destination: {anticipated dc setting:21604} 10. Overall Rehab/Functional Prognosis: {potential:3041437}   MD Signature: ***

## 2024-09-01 NOTE — Progress Notes (Signed)
 TCTS Evening Rounds:  Hemodynamically stable in atrial flutter 80's on amio.  Milrinone  0.375 and epi 1.   CVP has been 11. Co-ox 50 and 54.5 this am.   UO ok  Had two BM's. Taking clear liquids well. Advanced to full liquids per her request.  Ambulate down hall.

## 2024-09-01 NOTE — Progress Notes (Signed)
 NAME:  Katie Jacobson, MRN:  [id number], DOB:  January 01, 1956, LOS: 6 ADMISSION DATE:  08/26/2024, CONSULTATION DATE:  08/26/24 REFERRING MD:  Katie Jacobson, CHIEF COMPLAINT:  s/p AVR MVR LAA clipping  History of Present Illness:  [name] 2/2 rheumatic valve dz, tobacco use, breast ca s/p R mastectomy and radiation, HTN, HLD  who presented to [facility] 08/26/24 for planned AVR MVR LAA clipping.  Admitted to ICU post op as per pre-op plan PCCM consulted in this setting  Xclamp 232 Total pump 177 EBL 1250 Product 1 Plt 746 cellsaver   Pertinent  Medical History  Rheumatic valve disease Severe AI Severe mitral regurg  HTN HLD Tobacco use  Breast ca s/p R mastectomy, radiation  Significant Hospital Events: Including procedures, antibiotic start and stop dates in addition to other pertinent events   08/26/24 AVR MVR LAA clipping  11/18 extubate 11/19 add milrinone , cont epi. Intractable Nausea and vomiting. Treated w/ Emend  after failing other antiemetics Adv HF called to consult. Recommended continuing epinephrine , and milrinone , added IV lasix  and gtt at 15mg /hr , started on amiodarone  for afib/flutter w/ RVR. Scr increased some. PICC placed but top angled up.  11/21 Weaned off high flow oxygen. Tolerating IV diuresis. Did not tolerate weaning off epi. Recurrent nausea/ vomiting.    Interim History / Subjective:  This morning she is hungry and wants to try eating. She has been passing more gas and had 1 BM this morning.   Objective    Blood pressure (!) 105/91, pulse 88, temperature 98 F (36.7 C), temperature source Axillary, resp. rate 20, height 5' 1 (1.549 m), weight 103.1 kg, last menstrual period 10/10/1996, SpO2 (!) 89%. CVP:  [9 mmHg-13 mmHg] 10 mmHg  FiO2 (%):  [40 %] 40 %   Intake/Output Summary (Last 24 hours) at 09/01/2024 0714 Last data filed at 09/01/2024 0600 Gross per 24 hour  Intake 1609.47 ml  Output 2090 ml  Net -480.53 ml   Filed  Weights   08/30/24 0500 08/31/24 0500 09/01/24 0545  Weight: 108.3 kg 103.3 kg 103.1 kg    Examination: General ill appearing woman sitting up in the chair in NAD, more interactive than yesterday HEENT Katie Jacobson/AT, eyes anicteric, NGT with small volume of bilious output Pulmonary breathing comfortably on Katie Jacobson, reduced basilar breath sounds Cardiac S1S2, RRR Abdomen soft, NT, + bowel sounds Extremities  +edema, similar to previous exams. TED hose. Neuro awake, alert, answering questions appropriately  I/O -480cc , net -4.2L for admission  Coox 50%  K+ 3.7 BUN 21 Cr 1.53 WBC 7.6 H/H 8.8/25.7  platelets 130   Resolved problem list  Endotracheally intubated   Assessment and Plan   H/o severe MR with mod stenosis and severe AI. S/p MVR, AVR and LAA clip on 11/17 LBBB  Acute HFrEF & cardiogenic shock> recovered EF on follow up TEE Atrial flutter, controlled rate -repeat coox -con't epi 1, milrinone  0.375 -hold diuretics today -con't amiodarone  IV -no need for full dose AC with occlusive LAA clip -progress mobility -pulmonary hygiene -Aspirin , statin -limit opiates as much as possible with ileus  Acute hypoxic respiratory failure -initially secondary to pulmonary edema, with ongoing oxygen needs likely exacerbated by right sided atelectasis. Very small R pleural effusion-- has likely had a significant component of shunt from atelectasis. H/o tobacco abuse  -pulmonary hygiene, OOB mobility  Intractable nausea and vomiting due to ileus; has complicated her progression this week Constipation, improving -d/c NGT -sips &  ice chips -OOB mobility -reglan   AKI -renally dose meds, avoid nephrotoxic meds -strict I/O -maintain adequate perfusion  Hypokalemia; resolved -additional IV repletion  Hyponatremia -avoid hypotonic fluids  Hyperglycemia, A1c 5.5 -SSI PRN -goal BG 140-180  Expected ABLA and thrombocytopenia; 1 unit pRBC -monitor  Hx breast ca s/p R mastectomy,  radiation   Hx HTN & Hx HLD  -hold PTA enalapril , Toprol , and chlorthalidone    Katie SHAUNNA Gaskins, DO 09/01/24 8:31 AM [facility]  For contact information, see Amion. If no response to pager, please call PCCM consult pager. After hours, 7PM- 7AM, please call Elink.

## 2024-09-01 NOTE — Progress Notes (Signed)
 6 Days Post-Op Procedure(s) (LRB): REPLACEMENT, AORTIC VALVE, OPEN, UTILIZING INSPIRIS RESILIA  AORTIC VALVE (N/A) REPLACEMENT, MITRAL VALVE, UTILIZING MITRIS RESILIA MITRAL VALVE (N/A) CLIPPING, LEFT ATRIAL APPENDAGE UTILIZING ATRICURE PRO 40 (N/A) ECHOCARDIOGRAM, TRANSESOPHAGEAL, INTRAOPERATIVE (N/A) Subjective:  Feels better today. Had BM overnight. Passing flatus. Says her nausea has resolved. NG out and taking sips and chips this am.  BP dropped into the 80's overnight and I gave her a unit of PRBC's since her Hgb was 8.1. 113/60 this am. Remains in atrial flutter with controlled rate in the 80's on IV amio. Milrinone  0.375 and epi 1. Co-ox 50% this am early.  Objective: Vital signs in last 24 hours: Temp:  [97.7 F (36.5 C)-99.1 F (37.3 C)] 98 F (36.7 C) (11/23 0425) Pulse Rate:  [81-97] 88 (11/23 0500) Cardiac Rhythm: Normal sinus rhythm (11/22 2000) Resp:  [17-40] 20 (11/23 0500) BP: (70-135)/(39-91) 105/91 (11/23 0500) SpO2:  [87 %-100 %] 89 % (11/23 0500) Weight:  [103.1 kg] 103.1 kg (11/23 0545)  Hemodynamic parameters for last 24 hours: CVP:  [9 mmHg-11 mmHg] 10 mmHg  Intake/Output from previous day: 11/22 0701 - 11/23 0700 In: 1609.5 [I.V.:742.9; Blood:315; NG/GT:50; IV Piggyback:501.6] Out: 2090 [Urine:1515; Emesis/NG output:575] Intake/Output this shift: No intake/output data recorded.  General appearance: alert, cooperative, and much better spirits this am. Neurologic: intact Heart: regularly irregular rhythm Lungs: clear to auscultation bilaterally Abdomen: soft, non-tender; bowel sounds present. Extremities: warm,  no edema Wound: chest incision ok  Lab Results: Recent Labs    08/31/24 0441 09/01/24 0500  WBC 7.6 7.6  HGB 8.1* 8.8*  HCT 23.5* 25.7*  PLT 140* 130*   BMET:  Recent Labs    08/31/24 1600 09/01/24 0500  NA 132* 134*  K 3.8 3.7  CL 87* 90*  CO2 27 28  GLUCOSE 162* 149*  BUN 18 21  CREATININE 1.38* 1.53*  CALCIUM   8.2* 8.0*    PT/INR: No results for input(s): LABPROT, INR in the last 72 hours. ABG    Component Value Date/Time   PHART 7.415 08/29/2024 0105   HCO3 22.7 08/29/2024 0105   TCO2 24 08/29/2024 0105   ACIDBASEDEF 2.0 08/29/2024 0105   O2SAT 50 09/01/2024 0506   CBG (last 3)  Recent Labs    08/31/24 2336 09/01/24 0425 09/01/24 0804  GLUCAP 97 107* 117*    Assessment/Plan: S/P Procedure(s) (LRB): REPLACEMENT, AORTIC VALVE, OPEN, UTILIZING INSPIRIS RESILIA  AORTIC VALVE (N/A) REPLACEMENT, MITRAL VALVE, UTILIZING MITRIS RESILIA MITRAL VALVE (N/A) CLIPPING, LEFT ATRIAL APPENDAGE UTILIZING ATRICURE PRO 40 (N/A) ECHOCARDIOGRAM, TRANSESOPHAGEAL, INTRAOPERATIVE (N/A)  POD 6 Hemodynamically stable this am on same milrinone  and epi. Co-ox was down to 50 but she looks fine otherwise. CVP ok. This may have been due to decreased arterial sat early in the am.   -480 cc yesterday. Wt stable and below preop. Creat bumped slightly probably due to intravascular depletion with diuretic, fluid into GI tract and maybe some hypotension. Follow. Would hold off on further diuresis today.  Ileus appears to be resolving. Continue sips and chips.  Will need to decide about anticoagulation with persistent AF. Will discuss with Dr. Daniel.  Continue IS, OOB, ambulation.   LOS: 6 days    Dorise MARLA Fellers 09/01/2024

## 2024-09-02 ENCOUNTER — Telehealth (HOSPITAL_COMMUNITY): Payer: Self-pay

## 2024-09-02 ENCOUNTER — Inpatient Hospital Stay (HOSPITAL_COMMUNITY)

## 2024-09-02 ENCOUNTER — Encounter

## 2024-09-02 ENCOUNTER — Other Ambulatory Visit (HOSPITAL_COMMUNITY): Payer: Self-pay

## 2024-09-02 DIAGNOSIS — E8770 Fluid overload, unspecified: Secondary | ICD-10-CM | POA: Diagnosis not present

## 2024-09-02 DIAGNOSIS — I340 Nonrheumatic mitral (valve) insufficiency: Secondary | ICD-10-CM | POA: Diagnosis not present

## 2024-09-02 DIAGNOSIS — I351 Nonrheumatic aortic (valve) insufficiency: Secondary | ICD-10-CM | POA: Diagnosis not present

## 2024-09-02 DIAGNOSIS — I4892 Unspecified atrial flutter: Secondary | ICD-10-CM | POA: Diagnosis not present

## 2024-09-02 LAB — COOXEMETRY PANEL
Carboxyhemoglobin: 1.5 % (ref 0.5–1.5)
Methemoglobin: <0.7 % (ref 0.0–1.5)
O2 Saturation: 58.1 %
Total hemoglobin: 8.1 g/dL — ABNORMAL LOW (ref 12.0–16.0)

## 2024-09-02 LAB — CBC
HCT: 26.1 % — ABNORMAL LOW (ref 36.0–46.0)
Hemoglobin: 8.8 g/dL — ABNORMAL LOW (ref 12.0–15.0)
MCH: 30.7 pg (ref 26.0–34.0)
MCHC: 33.7 g/dL (ref 30.0–36.0)
MCV: 90.9 fL (ref 80.0–100.0)
Platelets: 139 K/uL — ABNORMAL LOW (ref 150–400)
RBC: 2.87 MIL/uL — ABNORMAL LOW (ref 3.87–5.11)
RDW: 18.1 % — ABNORMAL HIGH (ref 11.5–15.5)
WBC: 9.1 K/uL (ref 4.0–10.5)
nRBC: 4.1 % — ABNORMAL HIGH (ref 0.0–0.2)

## 2024-09-02 LAB — BASIC METABOLIC PANEL WITH GFR
Anion gap: 14 (ref 5–15)
Anion gap: 13 (ref 5–15)
BUN: 19 mg/dL (ref 8–23)
BUN: 17 mg/dL (ref 8–23)
CO2: 29 mmol/L (ref 22–32)
CO2: 29 mmol/L (ref 22–32)
Calcium: 7.9 mg/dL — ABNORMAL LOW (ref 8.9–10.3)
Calcium: 8.1 mg/dL — ABNORMAL LOW (ref 8.9–10.3)
Chloride: 88 mmol/L — ABNORMAL LOW (ref 98–111)
Chloride: 92 mmol/L — ABNORMAL LOW (ref 98–111)
Creatinine, Ser: 1.25 mg/dL — ABNORMAL HIGH (ref 0.44–1.00)
Creatinine, Ser: 1.23 mg/dL — ABNORMAL HIGH (ref 0.44–1.00)
GFR, Estimated: 48 mL/min — ABNORMAL LOW (ref >60)
GFR, Estimated: 49 mL/min — ABNORMAL LOW (ref >60)
Glucose, Bld: 135 mg/dL — ABNORMAL HIGH (ref 70–99)
Glucose, Bld: 143 mg/dL — ABNORMAL HIGH (ref 70–99)
Potassium: 3.2 mmol/L — ABNORMAL LOW (ref 3.5–5.1)
Potassium: 3.4 mmol/L — ABNORMAL LOW (ref 3.5–5.1)
Sodium: 131 mmol/L — ABNORMAL LOW (ref 135–145)
Sodium: 134 mmol/L — ABNORMAL LOW (ref 135–145)

## 2024-09-02 LAB — GLUCOSE, CAPILLARY
Glucose-Capillary: 114 mg/dL — ABNORMAL HIGH (ref 70–99)
Glucose-Capillary: 119 mg/dL — ABNORMAL HIGH (ref 70–99)
Glucose-Capillary: 126 mg/dL — ABNORMAL HIGH (ref 70–99)
Glucose-Capillary: 132 mg/dL — ABNORMAL HIGH (ref 70–99)
Glucose-Capillary: 133 mg/dL — ABNORMAL HIGH (ref 70–99)
Glucose-Capillary: 148 mg/dL — ABNORMAL HIGH (ref 70–99)
Glucose-Capillary: 158 mg/dL — ABNORMAL HIGH (ref 70–99)

## 2024-09-02 LAB — MAGNESIUM: Magnesium: 2.1 mg/dL (ref 1.7–2.4)

## 2024-09-02 MED ORDER — PANTOPRAZOLE SODIUM 40 MG PO TBEC
40.0000 mg | DELAYED_RELEASE_TABLET | Freq: Every day | ORAL | Status: DC
  Administered 2024-09-02 – 2024-09-12 (×11): 40 mg via ORAL
  Filled 2024-09-02 (×11): qty 1

## 2024-09-02 MED ORDER — APIXABAN 5 MG PO TABS
5.0000 mg | ORAL_TABLET | Freq: Two times a day (BID) | ORAL | Status: DC
  Administered 2024-09-02 – 2024-09-09 (×16): 5 mg via ORAL
  Filled 2024-09-02 (×10): qty 1
  Filled 2024-09-02: qty 2
  Filled 2024-09-02 (×5): qty 1

## 2024-09-02 MED ORDER — POTASSIUM CHLORIDE 20 MEQ PO PACK
40.0000 meq | PACK | Freq: Once | ORAL | Status: AC
  Administered 2024-09-02: 40 meq via ORAL
  Filled 2024-09-02: qty 2

## 2024-09-02 MED ORDER — SPIRONOLACTONE 12.5 MG HALF TABLET
12.5000 mg | ORAL_TABLET | Freq: Every day | ORAL | Status: DC
  Administered 2024-09-02 – 2024-09-03 (×2): 12.5 mg via ORAL
  Filled 2024-09-02 (×3): qty 1

## 2024-09-02 MED ORDER — ASPIRIN 81 MG PO TBEC
81.0000 mg | DELAYED_RELEASE_TABLET | Freq: Every day | ORAL | Status: DC
  Administered 2024-09-02 – 2024-09-12 (×11): 81 mg via ORAL
  Filled 2024-09-02 (×11): qty 1

## 2024-09-02 MED ORDER — POTASSIUM CHLORIDE 20 MEQ PO PACK: 20.0000 meq | PACK | Freq: Once | ORAL | Status: DC

## 2024-09-02 MED ORDER — ENSURE PLUS HIGH PROTEIN PO LIQD
237.0000 mL | Freq: Two times a day (BID) | ORAL | Status: DC
  Administered 2024-09-02 – 2024-09-09 (×8): 237 mL via ORAL

## 2024-09-02 MED ORDER — POTASSIUM CHLORIDE 10 MEQ/50ML IV SOLN
10.0000 meq | INTRAVENOUS | Status: AC
  Administered 2024-09-02 (×3): 10 meq via INTRAVENOUS
  Filled 2024-09-02 (×3): qty 50

## 2024-09-02 MED ORDER — FUROSEMIDE 10 MG/ML IJ SOLN
80.0000 mg | Freq: Once | INTRAMUSCULAR | Status: AC
  Administered 2024-09-02: 80 mg via INTRAVENOUS
  Filled 2024-09-02: qty 8

## 2024-09-02 MED ORDER — ACETAMINOPHEN 325 MG PO TABS
650.0000 mg | ORAL_TABLET | Freq: Four times a day (QID) | ORAL | Status: DC | PRN
  Administered 2024-09-02 – 2024-09-04 (×3): 650 mg via ORAL
  Filled 2024-09-02 (×3): qty 2

## 2024-09-02 MED ORDER — POTASSIUM CHLORIDE 10 MEQ/50ML IV SOLN
10.0000 meq | INTRAVENOUS | Status: AC
Start: 2024-09-02 — End: 2024-09-02
  Administered 2024-09-02 (×3): 10 meq via INTRAVENOUS
  Filled 2024-09-02 (×3): qty 50

## 2024-09-02 MED ORDER — SIMETHICONE 80 MG PO CHEW
80.0000 mg | CHEWABLE_TABLET | Freq: Three times a day (TID) | ORAL | Status: DC | PRN
  Administered 2024-09-04 – 2024-09-05 (×4): 80 mg via ORAL
  Filled 2024-09-02 (×5): qty 1

## 2024-09-02 NOTE — Plan of Care (Signed)
 Ambulated better in the hallway, 190 feet and paused once to sit. Appetite improving. Foley and wound vac removed. Still in A flutter,still on Amio gtt, Milrinone  and Epi. Potassium replaced multiple times today.

## 2024-09-02 NOTE — Progress Notes (Signed)
 Occupational Therapy Treatment Patient Details Name: Katie Jacobson MRN: [id number] DOB: 25-Nov-1955 Today's Date: 09/02/2024   History of present illness Pt is 68 yo presenting [facility] on [facility]/17 for scheduled AVR/MVR. PMH: breast cancer (triple negative), rheumatic valve disease, severe AI, sever mitral regurgitation, HTN, HLD   OT comments  Pt progressing toward goals this session, currently needs set up- max A for ADLs, minA for bed mobility and min +2 for transfers with RW. Pt desatting with questionable wave pleth on RA with ambulation, needs 2-4L O2 to remain in 90s, and x1 seated rest break during ambulation. Pt needs min cues for sternal precautions throughout. Pt presenting with impairments listed below, will follow acutely. Patient will benefit from intensive inpatient follow-up therapy, >3 hours/day to maximize safety/ind with ADL/functional mobility.       If plan is discharge home, recommend the following:  A lot of help with walking and/or transfers;A lot of help with bathing/dressing/bathroom;Assistance with cooking/housework   Equipment Recommendations  Other (comment) (defer)    Recommendations for Other Services      Precautions / Restrictions Precautions Precautions: Sternal Restrictions Weight Bearing Restrictions Per Provider Order: No       Mobility Bed Mobility Overal bed mobility: Needs Assistance Bed Mobility: Supine to Sit     Supine to sit: Min assist          Transfers Overall transfer level: Needs assistance Equipment used: Rolling walker (2 wheels) Transfers: Sit to/from Stand Sit to Stand: Min assist, +2 physical assistance                 Balance Overall balance assessment: Needs assistance Sitting-balance support: Bilateral upper extremity supported, Feet supported Sitting balance-Leahy Scale: Fair     Standing balance support: Bilateral upper extremity supported, During functional activity, Reliant on assistive device for  balance Standing balance-Leahy Scale: Poor                             ADL either performed or assessed with clinical judgement   ADL Overall ADL's : Needs assistance/impaired     Grooming: Oral care;Set up;Sitting               Lower Body Dressing: Maximal assistance;Sitting/lateral leans Lower Body Dressing Details (indicate cue type and reason): to don socks Toilet Transfer: Minimal assistance;Ambulation;Rolling walker (2 wheels)           Functional mobility during ADLs: Minimal assistance;Rolling walker (2 wheels)      Extremity/Trunk Assessment Upper Extremity Assessment Upper Extremity Assessment: Generalized weakness   Lower Extremity Assessment Lower Extremity Assessment: Defer to PT evaluation        Vision   Vision Assessment?: No apparent visual deficits   Perception Perception Perception: Not tested   Praxis Praxis Praxis: Not tested   Communication Communication Communication: No apparent difficulties   Cognition Arousal: Alert Behavior During Therapy: Anxious Cognition: No apparent impairments                               Following commands: Intact        Cueing   Cueing Techniques: Verbal cues  Exercises      Shoulder Instructions       General Comments VSS on 2-4L O2 this session    Pertinent Vitals/ Pain       Pain Assessment Pain Assessment: No/denies pain  Home Living  Prior Functioning/Environment              Frequency  Min 2X/week        Progress Toward Goals  OT Goals(current goals can now be found in the care plan section)  Progress towards OT goals: Progressing toward goals  Acute Rehab OT Goals Patient Stated Goal: none stated OT Goal Formulation: With patient Time For Goal Achievement: 09/12/24 Potential to Achieve Goals: Good ADL Goals Pt Will Perform Grooming: with supervision;standing Pt Will Perform  Upper Body Dressing: with supervision;sitting Pt Will Perform Lower Body Dressing: with supervision;sit to/from stand Pt Will Transfer to Toilet: with supervision;ambulating;regular height toilet Pt Will Perform Toileting - Clothing Manipulation and hygiene: with supervision;sit to/from stand;sitting/lateral leans Additional ADL Goal #1: Pt will verbalize at least 3 energy conservation strategies for ADLs  Plan      Co-evaluation                 AM-PAC OT 6 Clicks Daily Activity     Outcome Measure   Help from another person eating meals?: None Help from another person taking care of personal grooming?: A Little Help from another person toileting, which includes using toliet, bedpan, or urinal?: A Little Help from another person bathing (including washing, rinsing, drying)?: A Little Help from another person to put on and taking off regular upper body clothing?: A Little Help from another person to put on and taking off regular lower body clothing?: A Little 6 Click Score: 19    End of Session Equipment Utilized During Treatment: Rolling walker (2 wheels);Oxygen  OT Visit Diagnosis: Unsteadiness on feet (R26.81);Other abnormalities of gait and mobility (R26.89);Muscle weakness (generalized) (M62.81)   Activity Tolerance Patient limited by fatigue   Patient Left with call bell/phone within reach;in chair;with nursing/sitter in room   Nurse Communication Mobility status        Time: 1331-1401 OT Time Calculation (min): 30 min  Charges: OT General Charges $OT Visit: 1 Visit OT Treatments $Self Care/Home Management : 8-22 mins $Therapeutic Activity: 8-22 mins  Georgine Wiltse K, OTD, OTR/L SecureChat Preferred Acute Rehab [phone]   Katie Jacobson 09/02/2024, 2:52 PM

## 2024-09-02 NOTE — Telephone Encounter (Signed)
 Pharmacy Patient Advocate Encounter  Insurance verification completed.    The patient is insured through [employer]/ [employer]. Patient has Medicare and is not eligible for a copay card, but may be able to apply for patient assistance or Medicare RX Payment Plan (Patient Must reach out to their plan, if eligible for payment plan), if available.    Ran test claim for Eliquis 5mg  and the current 30 day co-pay is $47.   This test claim was processed through [facility]- copay amounts may vary at other pharmacies due to [employer], or as the patient moves through the different stages of their insurance plan.

## 2024-09-02 NOTE — Progress Notes (Addendum)
 Patient ID: Katie Jacobson, female   DOB: 1956-09-12, 68 y.o.   MRN: [id number]     Advanced Heart Failure Rounding Note  Cardiologist: Alm Clay, MD  Chief Complaint: CHF Subjective:    POD# 7   Remains on NE 0.375 and epi 1. Co-ox 58%. CVP 10. Edematous on exam.   SCr 1.53>>1.25. K 3.2  Remains in rate controlled AFL   Feeling better today, breathing improving. Ambulated today.    Post-op echo showed LV EF 65%, mild RV dilation with normal systolic function but there is likely severe tricuspid regurgitation, s/p bioprosthetic MV replacement with mean gradient 4, s/p bioprosthetic AV replacement with mean gradient 20.    Objective:   Weight Range: 103.2 kg Body mass index is 42.99 kg/m.   Vital Signs:   Temp:  [97.3 F (36.3 C)-99.7 F (37.6 C)] 99 F (37.2 C) (11/24 0800) Pulse Rate:  [83-92] 83 (11/24 0800) Resp:  [18-41] 21 (11/24 0800) BP: (80-133)/(45-76) 104/59 (11/24 0700) SpO2:  [87 %-97 %] 93 % (11/24 0800) Weight:  [103.2 kg] 103.2 kg (11/24 0500) Last BM Date : 09/01/24  Weight change: Filed Weights   08/31/24 0500 09/01/24 0545 09/02/24 0500  Weight: 103.3 kg 103.1 kg 103.2 kg    Intake/Output:   Intake/Output Summary (Last 24 hours) at 09/02/2024 0916 Last data filed at 09/02/2024 9178 Gross per 24 hour  Intake 1272.5 ml  Output 775 ml  Net 497.5 ml      Physical Exam   CVP 10  GENERAL: NAD Lungs- diminished at the bases bilaterally  CARDIAC:  JVP 10 cm          Irregular rhythm, regular rate. + sternal wound ok. 2+ b/l LEE + LUE PICC  ABDOMEN: Soft, non-tender, non-distended.  EXTREMITIES: Warm and well perfused.  NEUROLOGIC: No obvious FND GU: + foley   Telemetry   AFL 80s, Personally reviewed   Labs    CBC Recent Labs    09/01/24 0500 09/02/24 0430  WBC 7.6 9.1  HGB 8.8* 8.8*  HCT 25.7* 26.1*  MCV 90.2 90.9  PLT 130* 139*   Basic Metabolic Panel Recent Labs    88/76/74 0500 09/02/24 0430  NA 134* 131*   K 3.7 3.2*  CL 90* 88*  CO2 28 29  GLUCOSE 149* 135*  BUN 21 19  CREATININE 1.53* 1.25*  CALCIUM  8.0* 7.9*  MG 2.2 2.1   Liver Function Tests No results for input(s): AST, ALT, ALKPHOS, BILITOT, PROT, ALBUMIN  in the last 72 hours. No results for input(s): LIPASE, AMYLASE in the last 72 hours. Cardiac Enzymes No results for input(s): CKTOTAL, CKMB, CKMBINDEX, TROPONINI in the last 72 hours.  BNP: BNP (last 3 results) No results for input(s): BNP in the last 8760 hours.  ProBNP (last 3 results) No results for input(s): PROBNP in the last 8760 hours.   D-Dimer No results for input(s): DDIMER in the last 72 hours. Hemoglobin A1C No results for input(s): HGBA1C in the last 72 hours. Fasting Lipid Panel No results for input(s): CHOL, HDL, LDLCALC, TRIG, CHOLHDL, LDLDIRECT in the last 72 hours. Thyroid  Function Tests No results for input(s): TSH, T4TOTAL, T3FREE, THYROIDAB in the last 72 hours.  Invalid input(s): FREET3  Other results:   Imaging    DG CHEST PORT 1 VIEW Result Date: 09/02/2024 CLINICAL DATA:  Status post AVR. EXAM: PORTABLE CHEST 1 VIEW COMPARISON:  08/31/2024 FINDINGS: The cardio pericardial silhouette is enlarged. Interval improvement in bibasilar lung aeration with some  residual basilar atelectasis or infiltrate bilaterally and probable tiny bilateral effusions. Vascular congestion noted without overt edema. Left PICC line tip overlies the low SVC level. Telemetry leads overlie the chest. IMPRESSION: Interval improvement in bibasilar lung aeration with some residual basilar atelectasis or infiltrate and probable tiny bilateral effusions. Electronically Signed   By: Camellia Candle M.D.   On: 09/02/2024 08:01      Medications:     Scheduled Medications:  sodium chloride    Intravenous Once   apixaban   5 mg Oral BID   aspirin  EC  81 mg Oral Daily   bisacodyl   10 mg Oral Daily   Or   bisacodyl   10 mg  Rectal Daily   Chlorhexidine  Gluconate Cloth  6 each Topical Daily   feeding supplement  237 mL Oral BID BM   insulin  aspart  0-6 Units Subcutaneous Q4H   pantoprazole   40 mg Oral Daily   potassium chloride   40 mEq Oral Once   sodium chloride  flush  10-40 mL Intracatheter Q12H   sodium chloride  flush  3 mL Intravenous Q12H    Infusions:  amiodarone  30 mg/hr (09/02/24 0800)   epinephrine  1 mcg/min (09/02/24 0800)   milrinone  0.375 mcg/kg/min (09/02/24 0800)   potassium chloride  10 mEq (09/02/24 0833)   promethazine  (PHENERGAN ) injection (IM or IVPB) Stopped (08/31/24 1154)    PRN Medications: acetaminophen , albuterol , ondansetron  (ZOFRAN ) IV, mouth rinse, promethazine  (PHENERGAN ) injection (IM or IVPB), sodium chloride  flush, sodium chloride  flush    Assessment/Plan   1. Rheumatic heart disease: Severe MR and severe AI on 9/25 TEE with EF 55-60%. S/p AVR with 21 mm Inspiris valve, MVR with 27 mm Mitris valve and LAA ligation by Dr. Amahia Madonia on 08/26/24. Post-op echo (11/20) showed LV EF 65%, mild RV dilation with normal systolic function but there is likely severe tricuspid regurgitation, s/p bioprosthetic MV replacement with mean gradient 4, s/p bioprosthetic AV replacement with mean gradient 20.   2. Post-op cardiogenic shock/RV failure:  Echo post-op on 11/20 showed LV EF 65%, mild RV dilation with normal systolic function but there is likely severe tricuspid regurgitation, s/p bioprosthetic MV replacement with mean gradient 4, s/p bioprosthetic AV replacement with mean gradient 20.  - Co-ox remains marginal at 58% on milrinone  0.375 and epi 1 - Remains volume overloaded. CVP 10 - Diurese w/ IV Lasix  80 mg x 1 and place UNNA boots - continue Milrinone  and NE at current doses until fully diuresed  3. Atrial flutter: Atypical, noted post-op. She is currently in AFL, rate controlled  - Continue amiodarone  gtt.  - Eliquis  started per CT surgery  - Can consider eventual DC-CV once fully  diuresed  4. AKI: Creatinine up 1.2 => 1.49 => 1.4 -> 1.3  -> 1.5->1.25  5. Anemia: Post-op.  Transfuse hgb < 8. 8.8 today.  6. Hypokalemia - K 3.2 - give K supp w/ diuresis  7. Post-op ileus - improved with Reglan   CRITICAL CARE Performed by: Caffie Shed   Total critical care time: 15 minutes  Critical care time was exclusive of separately billable procedures and treating other patients.  Critical care was necessary to treat or prevent imminent or life-threatening deterioration.  Critical care was time spent personally by me on the following activities: development of treatment plan with patient and/or surrogate as well as nursing, discussions with consultants, evaluation of patient's response to treatment, examination of patient, obtaining history from patient or surrogate, ordering and performing treatments and interventions, ordering and review of laboratory studies,  ordering and review of radiographic studies, pulse oximetry and re-evaluation of patient's condition.    Length of Stay: [address], [place]  09/02/2024, 9:16 AM  Advanced Heart Failure Team Pager [phone] (M-F; 7a - 5p)  Please contact [facility] for night-coverage after hours (5p -7a ) and weekends on [email or web address]  Agree with above,  Feeling better. Remains on milrinone  0.375 and epi 1. Co-ox marginal but ok. CVP 12-13  Ileus resolving  Has been off lasix   General:  Sitting up in bed. No resp difficulty HEENT: normal Neck: supple. JVP to jaw  Cor: Irregular rate & rhythm. No rubs, gallops or murmurs. Lungs: clear Abdomen: soft, nontender, nondistended.Good bowel sounds. Extremities: no cyanosis, clubbing, rash, 2-3+ edema Neuro: alert & orientedx3, cranial nerves grossly intact. moves all 4 extremities w/o difficulty. Affect pleasant  Overall improving. Remains on milrinone  and epi. Needs diuresis.   Start lasix  80IV daily. Add spiro 12.5 to help preserve K.   Continue epi and  milrinone  until fully diuresed  Eliquis  started today, Can consider DC-CV prior to discharge.   CRITICAL CARE Performed by: Cherrie Sieving  Total critical care time: 45 minutes  Critical care time was exclusive of separately billable procedures and treating other patients.  Critical care was necessary to treat or prevent imminent or life-threatening deterioration.  Critical care was time spent personally by me (independent of midlevel providers or residents) on the following activities: development of treatment plan with patient and/or surrogate as well as nursing, discussions with consultants, evaluation of patient's response to treatment, examination of patient, obtaining history from patient or surrogate, ordering and performing treatments and interventions, ordering and review of laboratory studies, ordering and review of radiographic studies, pulse oximetry and re-evaluation of patient's condition.  Sieving Cherrie, MD  11:40 AM

## 2024-09-02 NOTE — Progress Notes (Signed)
 Orthopedic Tech Progress Note Patient Details:  Katie Jacobson [birth year] [id number]  Ortho Devices Type of Ortho Device: Ace wrap, Unna boot Ortho Device/Splint Location: BLE Ortho Device/Splint Interventions: Ordered, Application, Adjustment   Post Interventions Patient Tolerated: Well Instructions Provided: Care of device  Katie Jacobson Pac 09/02/2024, 5:22 PM

## 2024-09-02 NOTE — Progress Notes (Signed)
 Inpatient Rehab Admissions Coordinator:    CIR following, not yet medically ready for CIR.   Leita Kleine, MS, CCC-SLP Rehab Admissions Coordinator  [phone] (celll) [phone] (office)

## 2024-09-02 NOTE — Progress Notes (Signed)
 NAME:  Katie Jacobson, MRN:  [id number], DOB:  08-Nov-1955, LOS: 7 ADMISSION DATE:  08/26/2024, CONSULTATION DATE:  08/26/24 REFERRING MD:  Daniel, CHIEF COMPLAINT:  s/p AVR MVR LAA clipping  History of Present Illness:  [name] 2/2 rheumatic valve dz, tobacco use, breast ca s/p R mastectomy and radiation, HTN, HLD  who presented to [facility]26/24 for planned AVR MVR LAA clipping.  Admitted to ICU post op as per pre-op plan PCCM consulted in this setting  Xclamp 232 Total pump 177 EBL 1250 Product 1 Plt 746 cellsaver   Pertinent  Medical History  Rheumatic valve disease Severe AI Severe mitral regurg  HTN HLD Tobacco use  Breast ca s/p R mastectomy, radiation  Significant Hospital Events: Including procedures, antibiotic start and stop dates in addition to other pertinent events   08/26/24 AVR MVR LAA clipping  11/18 extubate 11/19 add milrinone , cont epi. Intractable Nausea and vomiting. Treated w/ Emend  after failing other antiemetics Adv HF called to consult. Recommended continuing epinephrine , and milrinone , added IV lasix  and gtt at 15mg /hr , started on amiodarone  for afib/flutter w/ RVR. Scr increased some. PICC placed but top angled up.  11/21 Weaned off high flow oxygen. Tolerating IV diuresis. Did not tolerate weaning off epi. Recurrent nausea/ vomiting.  11/23 ileus improved advancing diet   Interim History / Subjective:  Ambulated past nurses station this morning. Ileus continues to improve, had 6 BM yesterday. No complaint of pain. Does have fluttering in her chest.   Objective    Blood pressure (!) 104/59, pulse 83, temperature 99 F (37.2 C), temperature source Bladder, resp. rate (!) 21, height 5' 1 (1.549 m), weight 103.2 kg, last menstrual period 10/10/1996, SpO2 93%. CVP:  [2 mmHg-15 mmHg] 13 mmHg      Intake/Output Summary (Last 24 hours) at 09/02/2024 0849 Last data filed at 09/02/2024 9178 Gross per 24 hour  Intake  1325.67 ml  Output 775 ml  Net 550.67 ml   Filed Weights   08/31/24 0500 09/01/24 0545 09/02/24 0500  Weight: 103.3 kg 103.1 kg 103.2 kg    Examination: General: ill appearing female, resting in bed comfortably  HEENT: [place]/AT, sclera anicteric, JVP elevated to mid-neck at 30 degrees Pulmonary: breathing comfortably nasal cannula, reduced basilar breath sounds Cardiac: irregular rate and rhythm, no m/r/g Abdomen: soft, non-tender, non-distended, +bowel sounds Extremities: 1-2+ pitting edema to BLE, warm, dry Neuro: awake, alert, answering questions appropriately  Resolved problem list  Endotracheally intubated  Constipation  Assessment and Plan   H/o severe MR with mod stenosis and severe AI. S/p MVR, AVR and LAA clip on 11/17 LBBB  Acute HFrEF & cardiogenic shock> recovered EF on follow up TEE Atrial flutter, controlled rate Co-ox this morning 58.1. CVP overnight 10-15.  -continue milrinone  0.375, consider weaning epi -diuresis today per advanced heart failure -continue IV amiodarone   -will start eliquis  BID -consider DCCV for ongoing rate controlled atrial flutter, will discuss with advanced heart failure -continue to push mobility, pulmonary hygiene -continue ASA and statin  Acute hypoxic respiratory failure, initially secondary to pulmonary edema now likely due to atelectasis H/o tobacco abuse  CXR this morning with bibasilar atelectasis and probable tiny bilateral effusions.  -wean oxygen as able to maintain SpO2 92-94% -continue to push mobility, pulmonary hygiene  Ileus, improving -continue PRN bowel regimen -advance diet as tolerated  AKI, improving -renally dose meds, avoid nephrotoxic meds -strict I/O -maintain adequate perfusion  Hypokalemia -additional  IV repletion  Hyponatremia -avoid hypotonic fluids  Hyperglycemia, A1c 5.5 -SSI PRN -goal BG 140-180  Expected ABLA and thrombocytopenia; 1 unit pRBC -monitor  Hx breast ca s/p R mastectomy,  radiation   Hx HTN & Hx HLD  -hold PTA enalapril , Toprol , and chlorthalidone    JAYLE SOLARZ, DO 09/02/24 8:49 AM [facility]  For contact information, see Amion. If no response to pager, please call PCCM consult pager. After hours, 7PM- 7AM, please call Elink.

## 2024-09-02 NOTE — Progress Notes (Signed)
 7 Days Post-Op Procedure(s) (LRB): REPLACEMENT, AORTIC VALVE, OPEN, UTILIZING INSPIRIS RESILIA  AORTIC VALVE (N/A) REPLACEMENT, MITRAL VALVE, UTILIZING MITRIS RESILIA MITRAL VALVE (N/A) CLIPPING, LEFT ATRIAL APPENDAGE UTILIZING ATRICURE PRO 40 (N/A) ECHOCARDIOGRAM, TRANSESOPHAGEAL, INTRAOPERATIVE (N/A) Subjective: Doing well this morning Ambulated down to the nursing station PO intake improved, feels hungry Co-ox 58 this morning Still in A flutter  Objective: Vital signs in last 24 hours: BP (!) 104/59   Pulse 84   Temp 98.8 F (37.1 C)   Resp 20   Ht 5' 1 (1.549 m)   Wt 103.2 kg   LMP 10/10/1996   SpO2 95%   BMI 42.99 kg/m  Filed Weights   08/31/24 0500 09/01/24 0545 09/02/24 0500  Weight: 103.3 kg 103.1 kg 103.2 kg    Hemodynamic parameters for last 24 hours: CVP:  [2 mmHg-13 mmHg] 2 mmHg  Intake/Output from previous day: 11/23 0701 - 11/24 0700 In: 1349.5 [P.O.:300; I.V.:787.1; NG/GT:100; IV Piggyback:162.4] Out: 675 [Urine:675] Intake/Output this shift: No intake/output data recorded.  Physical Exam: General - Resting comfortably in chair CV - Aflutter 80s Resp - Unlabored on 3L [place] Abd - Soft, ND/NT Ext - Mild edema  Lab Results:    Latest Ref Rng & Units 09/02/2024    4:30 AM 09/01/2024    5:00 AM 08/31/2024    4:41 AM  CBC  WBC 4.0 - 10.5 K/uL 9.1  7.6  7.6   Hemoglobin 12.0 - 15.0 g/dL 8.8  8.8  8.1   Hematocrit 36.0 - 46.0 % 26.1  25.7  23.5   Platelets 150 - 400 K/uL 139  130  140       Latest Ref Rng & Units 09/02/2024    4:30 AM 09/01/2024    5:00 AM 08/31/2024    4:00 PM  CMP  Glucose 70 - 99 mg/dL 864  850  837   BUN 8 - 23 mg/dL 19  21  18    Creatinine 0.44 - 1.00 mg/dL 8.74  8.46  8.61   Sodium 135 - 145 mmol/L 131  134  132   Potassium 3.5 - 5.1 mmol/L 3.2  3.7  3.8   Chloride 98 - 111 mmol/L 88  90  87   CO2 22 - 32 mmol/L 29  28  27    Calcium  8.9 - 10.3 mg/dL 7.9  8.0  8.2     CXR: Improved  aeration  Assessment/Plan: S/P Procedure(s) (LRB): REPLACEMENT, AORTIC VALVE, OPEN, UTILIZING INSPIRIS RESILIA  AORTIC VALVE (N/A) REPLACEMENT, MITRAL VALVE, UTILIZING MITRIS RESILIA MITRAL VALVE (N/A) CLIPPING, LEFT ATRIAL APPENDAGE UTILIZING ATRICURE PRO 40 (N/A) ECHOCARDIOGRAM, TRANSESOPHAGEAL, INTRAOPERATIVE (N/A) POD7 s/p bioAVR/MVR + LAAL NEURO- intact  Pain control PRN Deconditioning- PT/OT CV- in Aflutter 80s despite amio gtt -> discuss cardioversion with HF team  Start eliquis  today  Epi 1, Milrinone  0.375 -> favor turning off epi today RESP- Continued improved lung aeration             Continue IS, pulm hygiene, ambulation  Wean oxygen as tolerated RENAL- creatinine and lytes Ok  Weight less than pre op-> ok for gentle diuresis today   Foley will come out today GI- tolerating liquid diet             Advance diet to soft  BM: Resolving ileus, having diarrhea Endo- BG well controlled ID- NAI DVT ppx - SCD + Eliquis   Dispo: ICU   LOS: 7 days    Con RAMAN Corbett [place] 09/02/2024

## 2024-09-03 DIAGNOSIS — E8770 Fluid overload, unspecified: Secondary | ICD-10-CM | POA: Diagnosis not present

## 2024-09-03 DIAGNOSIS — I340 Nonrheumatic mitral (valve) insufficiency: Secondary | ICD-10-CM | POA: Diagnosis not present

## 2024-09-03 DIAGNOSIS — I351 Nonrheumatic aortic (valve) insufficiency: Secondary | ICD-10-CM | POA: Diagnosis not present

## 2024-09-03 DIAGNOSIS — I519 Heart disease, unspecified: Secondary | ICD-10-CM

## 2024-09-03 LAB — BASIC METABOLIC PANEL WITH GFR
Anion gap: 12 (ref 5–15)
Anion gap: 10 (ref 5–15)
BUN: 16 mg/dL (ref 8–23)
BUN: 15 mg/dL (ref 8–23)
CO2: 29 mmol/L (ref 22–32)
CO2: 31 mmol/L (ref 22–32)
Calcium: 8.1 mg/dL — ABNORMAL LOW (ref 8.9–10.3)
Calcium: 8.4 mg/dL — ABNORMAL LOW (ref 8.9–10.3)
Chloride: 90 mmol/L — ABNORMAL LOW (ref 98–111)
Chloride: 92 mmol/L — ABNORMAL LOW (ref 98–111)
Creatinine, Ser: 1.04 mg/dL — ABNORMAL HIGH (ref 0.44–1.00)
Creatinine, Ser: 1.14 mg/dL — ABNORMAL HIGH (ref 0.44–1.00)
GFR, Estimated: 60 mL/min — ABNORMAL LOW (ref >60)
GFR, Estimated: 53 mL/min — ABNORMAL LOW (ref >60)
Glucose, Bld: 207 mg/dL — ABNORMAL HIGH (ref 70–99)
Glucose, Bld: 147 mg/dL — ABNORMAL HIGH (ref 70–99)
Potassium: 3.7 mmol/L (ref 3.5–5.1)
Potassium: 4.2 mmol/L (ref 3.5–5.1)
Sodium: 131 mmol/L — ABNORMAL LOW (ref 135–145)
Sodium: 133 mmol/L — ABNORMAL LOW (ref 135–145)

## 2024-09-03 LAB — CBC
HCT: 23.6 % — ABNORMAL LOW (ref 36.0–46.0)
Hemoglobin: 8.1 g/dL — ABNORMAL LOW (ref 12.0–15.0)
MCH: 31.6 pg (ref 26.0–34.0)
MCHC: 34.3 g/dL (ref 30.0–36.0)
MCV: 92.2 fL (ref 80.0–100.0)
Platelets: 154 K/uL (ref 150–400)
RBC: 2.56 MIL/uL — ABNORMAL LOW (ref 3.87–5.11)
RDW: 17.6 % — ABNORMAL HIGH (ref 11.5–15.5)
WBC: 7.8 K/uL (ref 4.0–10.5)
nRBC: 1.3 % — ABNORMAL HIGH (ref 0.0–0.2)

## 2024-09-03 LAB — COOXEMETRY PANEL
Carboxyhemoglobin: 1.6 % — ABNORMAL HIGH (ref 0.5–1.5)
Methemoglobin: <0.7 % (ref 0.0–1.5)
O2 Saturation: 62.8 %
Total hemoglobin: 8.3 g/dL — ABNORMAL LOW (ref 12.0–16.0)

## 2024-09-03 LAB — GLUCOSE, CAPILLARY
Glucose-Capillary: 110 mg/dL — ABNORMAL HIGH (ref 70–99)
Glucose-Capillary: 99 mg/dL (ref 70–99)
Glucose-Capillary: 110 mg/dL — ABNORMAL HIGH (ref 70–99)
Glucose-Capillary: 123 mg/dL — ABNORMAL HIGH (ref 70–99)
Glucose-Capillary: 114 mg/dL — ABNORMAL HIGH (ref 70–99)
Glucose-Capillary: 134 mg/dL — ABNORMAL HIGH (ref 70–99)

## 2024-09-03 LAB — MAGNESIUM: Magnesium: 1.9 mg/dL (ref 1.7–2.4)

## 2024-09-03 MED ORDER — ALTEPLASE 2 MG IJ SOLR
2.0000 mg | Freq: Once | INTRAMUSCULAR | Status: AC
  Administered 2024-09-03: 2 mg
  Filled 2024-09-03: qty 2

## 2024-09-03 MED ORDER — POTASSIUM CHLORIDE CRYS ER 20 MEQ PO TBCR
20.0000 meq | EXTENDED_RELEASE_TABLET | Freq: Once | ORAL | Status: AC
  Filled 2024-09-03: qty 1

## 2024-09-03 MED ORDER — FUROSEMIDE 10 MG/ML IJ SOLN
80.0000 mg | Freq: Two times a day (BID) | INTRAMUSCULAR | Status: AC
  Administered 2024-09-03 (×2): 80 mg via INTRAVENOUS
  Filled 2024-09-03 (×2): qty 8

## 2024-09-03 MED ORDER — LIDOCAINE 5 % EX PTCH
1.0000 | MEDICATED_PATCH | CUTANEOUS | Status: DC
  Administered 2024-09-03 – 2024-09-12 (×8): 1 via TRANSDERMAL
  Filled 2024-09-03 (×10): qty 1

## 2024-09-03 MED ORDER — METHOCARBAMOL 500 MG PO TABS
500.0000 mg | ORAL_TABLET | Freq: Three times a day (TID) | ORAL | Status: DC | PRN
  Administered 2024-09-03 – 2024-09-07 (×4): 500 mg via ORAL
  Filled 2024-09-03 (×4): qty 1

## 2024-09-03 MED ORDER — POTASSIUM CHLORIDE CRYS ER 20 MEQ PO TBCR
20.0000 meq | EXTENDED_RELEASE_TABLET | ORAL | Status: DC
  Filled 2024-09-03: qty 1

## 2024-09-03 MED ORDER — POTASSIUM CHLORIDE CRYS ER 20 MEQ PO TBCR
40.0000 meq | EXTENDED_RELEASE_TABLET | Freq: Once | ORAL | Status: AC
  Administered 2024-09-03: 40 meq via ORAL

## 2024-09-03 MED ORDER — MAGNESIUM SULFATE 2 GM/50ML IV SOLN
2.0000 g | Freq: Once | INTRAVENOUS | Status: AC
  Administered 2024-09-03: 2 g via INTRAVENOUS
  Filled 2024-09-03: qty 50

## 2024-09-03 MED ORDER — ALTEPLASE 2 MG IJ SOLR
2.0000 mg | Freq: Once | INTRAMUSCULAR | Status: AC
  Administered 2024-09-03: 2 mg

## 2024-09-03 MED ORDER — ALTEPLASE 2 MG IJ SOLR
INTRAMUSCULAR | Status: AC
  Administered 2024-09-03: 2 mg
  Filled 2024-09-03: qty 2

## 2024-09-03 MED ORDER — ALTEPLASE 2 MG IJ SOLR
INTRAMUSCULAR | Status: AC
  Filled 2024-09-03: qty 2

## 2024-09-03 MED ORDER — ALTEPLASE 2 MG IJ SOLR: 2.0000 mg | Freq: Once | INTRAMUSCULAR | Status: AC

## 2024-09-03 MED ORDER — POTASSIUM CHLORIDE CRYS ER 20 MEQ PO TBCR
40.0000 meq | EXTENDED_RELEASE_TABLET | Freq: Once | ORAL | Status: AC
  Administered 2024-09-03: 40 meq via ORAL
  Filled 2024-09-03: qty 2

## 2024-09-03 NOTE — Progress Notes (Signed)
 8 Days Post-Op Procedure(s) (LRB): REPLACEMENT, AORTIC VALVE, OPEN, UTILIZING INSPIRIS RESILIA  AORTIC VALVE (N/A) REPLACEMENT, MITRAL VALVE, UTILIZING MITRIS RESILIA MITRAL VALVE (N/A) CLIPPING, LEFT ATRIAL APPENDAGE UTILIZING ATRICURE PRO 40 (N/A) ECHOCARDIOGRAM, TRANSESOPHAGEAL, INTRAOPERATIVE (N/A) Subjective: Complaining of left back pack this morning Nausea improved, tolerating diet, had 2 soft BM overnight Co-ox 63 this morning, diuresed well yesterday  Objective: Vital signs in last 24 hours: BP (!) 127/112   Pulse 87   Temp 98.4 F (36.9 C) (Oral)   Resp (!) 29   Ht 5' 1 (1.549 m)   Wt 103.4 kg   LMP 10/10/1996   SpO2 94%   BMI 43.07 kg/m  Filed Weights   09/01/24 0545 09/02/24 0500 09/03/24 0500  Weight: 103.1 kg 103.2 kg 103.4 kg    Hemodynamic parameters for last 24 hours: CVP:  [0 mmHg-35 mmHg] 6 mmHg  Intake/Output from previous day: 11/24 0701 - 11/25 0700 In: 1536 [P.O.:600; I.V.:648.2; IV Piggyback:287.8] Out: 3550 [Urine:3550] Intake/Output this shift: No intake/output data recorded.  Physical Exam: General - Resting comfortably in chair CV - Aflutter 80s Resp - Unlabored on RA Abd - Soft, ND/NT Ext - Mild edema  Lab Results:    Latest Ref Rng & Units 09/03/2024    4:34 AM 09/02/2024    4:30 AM 09/01/2024    5:00 AM  CBC  WBC 4.0 - 10.5 K/uL 7.8  9.1  7.6   Hemoglobin 12.0 - 15.0 g/dL 8.1  8.8  8.8   Hematocrit 36.0 - 46.0 % 23.6  26.1  25.7   Platelets 150 - 400 K/uL 154  139  130       Latest Ref Rng & Units 09/03/2024    4:34 AM 09/02/2024    2:07 PM 09/02/2024    4:30 AM  CMP  Glucose 70 - 99 mg/dL 792  856  864   BUN 8 - 23 mg/dL 16  17  19    Creatinine 0.44 - 1.00 mg/dL 8.95  8.76  8.74   Sodium 135 - 145 mmol/L 131  134  131   Potassium 3.5 - 5.1 mmol/L 3.7  3.4  3.2   Chloride 98 - 111 mmol/L 90  92  88   CO2 22 - 32 mmol/L 29  29  29    Calcium  8.9 - 10.3 mg/dL 8.1  8.1  7.9     CXR: No new  imaging  Assessment/Plan: S/P Procedure(s) (LRB): REPLACEMENT, AORTIC VALVE, OPEN, UTILIZING INSPIRIS RESILIA  AORTIC VALVE (N/A) REPLACEMENT, MITRAL VALVE, UTILIZING MITRIS RESILIA MITRAL VALVE (N/A) CLIPPING, LEFT ATRIAL APPENDAGE UTILIZING ATRICURE PRO 40 (N/A) ECHOCARDIOGRAM, TRANSESOPHAGEAL, INTRAOPERATIVE (N/A) POD7 s/p bioAVR/MVR + LAAL NEURO- intact  Pain control PRN Deconditioning- PT/OT CV- in Aflutter 80s despite amio gtt -> possible cardioversion once euvolemic  On eliquis  today  Epi 1, Milrinone  0.375 -> favor turning off epi today RESP- Continued improved lung aeration             Continue IS, pulm hygiene, ambulation  Wean oxygen as tolerated RENAL- creatinine and lytes Ok  Weight stable but less than pre-op, maybe additional gentle diuresis today GI- post-op ileus resolving, continue soft diet for now Endo- BG well controlled ID- NAI DVT ppx - SCD + Eliquis   Dispo: ICU   LOS: 8 days    Con RAMAN Yasmyn Bellisario 09/03/2024

## 2024-09-03 NOTE — Progress Notes (Signed)
 NAME:  Katie Jacobson, MRN:  [id number], DOB:  1955-11-12, LOS: 8 ADMISSION DATE:  08/26/2024, CONSULTATION DATE:  08/26/24 REFERRING MD:  Daniel, CHIEF COMPLAINT:  s/p AVR MVR LAA clipping  History of Present Illness:  [name] 2/2 rheumatic valve dz, tobacco use, breast ca s/p R mastectomy and radiation, HTN, HLD  who presented to [facility] 08/26/24 for planned AVR MVR LAA clipping.  Admitted to ICU post op as per pre-op plan PCCM consulted in this setting  Xclamp 232 Total pump 177 EBL 1250 Product 1 Plt 746 cellsaver   Pertinent  Medical History  Rheumatic valve disease Severe AI Severe mitral regurg  HTN HLD Tobacco use  Breast ca s/p R mastectomy, radiation  Significant Hospital Events: Including procedures, antibiotic start and stop dates in addition to other pertinent events   08/26/24 AVR MVR LAA clipping  11/18 extubate 11/19 add milrinone , cont epi. Intractable Nausea and vomiting. Treated w/ Emend  after failing other antiemetics Adv HF called to consult. Recommended continuing epinephrine , and milrinone , added IV lasix  and gtt at 15mg /hr , started on amiodarone  for afib/flutter w/ RVR. Scr increased some. PICC placed but top angled up.  11/21 Weaned off high flow oxygen. Tolerating IV diuresis. Did not tolerate weaning off epi. Recurrent nausea/ vomiting.  11/23 ileus improved advancing diet   Interim History / Subjective:  Ambulated past nurses station this morning. Ileus continues to improve, had 6 BM yesterday. No complaint of pain. Does have fluttering in her chest.   Objective    Blood pressure (!) 127/112, pulse 85, temperature 98.4 F (36.9 C), temperature source Oral, resp. rate (!) 36, height 5' 1 (1.549 m), weight 103.4 kg, last menstrual period 10/10/1996, SpO2 (!) 88%. CVP:  [0 mmHg-35 mmHg] 6 mmHg      Intake/Output Summary (Last 24 hours) at 09/03/2024 0905 Last data filed at 09/03/2024 9182 Gross per 24 hour  Intake  1324.56 ml  Output 3550 ml  Net -2225.44 ml   Filed Weights   09/01/24 0545 09/02/24 0500 09/03/24 0500  Weight: 103.1 kg 103.2 kg 103.4 kg    Examination: General: awake, alert, sitting in chair, no acute distress HEENT: [place]/AT, sclera anicteric, JVD to mid-neck at 60 degrees Pulmonary: breathing comfortably on nasal  Cardiac: irregular rate and rhythm, no m/r/g Abdomen: soft, non-tender, non-distended, +bowel sounds Extremities: 1-2+ pitting edema to BLE, warm, dry Neuro: awake, alert, answering questions appropriately  Resolved problem list  Endotracheally intubated  Constipation Ileus Expected post-op thrombocytopenia  Assessment and Plan   H/o severe MR with mod stenosis and severe AI. S/p MVR, AVR and LAA clip on 11/17 LBBB  Acute HFrEF & cardiogenic shock> recovered EF on follow up TEE Atrial flutter, controlled rate Co-ox this morning 62.8. CVP overnight 6-8.  -continue milrinone  0.375, consider weaning epi -diuresis today per advanced heart failure -continue IV amiodarone , consider switching to PO - will defer to heart failure -continue eliquis  BID -consider DCCV when closer to euvolemic  -continue to push mobility, pulmonary hygiene -continue ASA and statin  Acute hypoxic respiratory failure, initially secondary to pulmonary edema now likely due to atelectasis H/o tobacco abuse  Adequate gas exchange on 3L nasal cannula. -wean oxygen as able to maintain SpO2 92-94% -continue to push mobility, pulmonary hygiene  AKI, improving -renally dose meds, avoid nephrotoxic meds -strict I/O -maintain adequate perfusion  Hypokalemia Hypomagnesemia -replete  Hyponatremia -avoid hypotonic fluids  Hyperglycemia, A1c 5.5 -SSI PRN -goal BG  140-180  Expected ABLA; 1 unit pRBC Hgb remains stable at 8.1 today. No active signs of bleeding. -continue to monitor daily CBC  Hx breast ca s/p R mastectomy, radiation   Hx HTN & Hx HLD  -hold PTA enalapril , Toprol ,  and chlorthalidone    Rexene LOISE Blush, PA-C [facility] 09/03/24 9:14 AM  Please see [email or web address] for pager details.  From 7A-7P if no response, please call [phone] After hours, please call ELink [phone]

## 2024-09-03 NOTE — Plan of Care (Signed)
  Problem: Education: Goal: Knowledge of General Education information will improve Description: Including pain rating scale, medication(s)/side effects and non-pharmacologic comfort measures Outcome: Progressing   Problem: Clinical Measurements: Goal: Ability to maintain clinical measurements within normal limits will improve Outcome: Progressing Goal: Will remain free from infection Outcome: Progressing Goal: Diagnostic test results will improve Outcome: Progressing Goal: Respiratory complications will improve Outcome: Progressing Goal: Cardiovascular complication will be avoided Outcome: Progressing   Problem: Activity: Goal: Risk for activity intolerance will decrease Outcome: Progressing   Problem: Coping: Goal: Level of anxiety will decrease Outcome: Progressing   Problem: Safety: Goal: Ability to remain free from injury will improve Outcome: Progressing   Problem: Skin Integrity: Goal: Risk for impaired skin integrity will decrease Outcome: Progressing   Problem: Activity: Goal: Ability to tolerate increased activity will improve Outcome: Progressing   Problem: Activity: Goal: Risk for activity intolerance will decrease Outcome: Progressing   Problem: Cardiac: Goal: Will achieve and/or maintain hemodynamic stability Outcome: Progressing   Problem: Education: Goal: Will demonstrate proper wound care and an understanding of methods to prevent future damage Outcome: Progressing Goal: Knowledge of disease or condition will improve Outcome: Progressing Goal: Knowledge of the prescribed therapeutic regimen will improve Outcome: Progressing Goal: Individualized Educational Video(s) Outcome: Progressing   Problem: Cardiac: Goal: Will achieve and/or maintain hemodynamic stability Outcome: Progressing   Problem: Clinical Measurements: Goal: Postoperative complications will be avoided or minimized Outcome: Progressing   Problem: Urinary Elimination: Goal:  Ability to achieve and maintain adequate renal perfusion and functioning will improve Outcome: Progressing   Problem: Education: Goal: Ability to describe self-care measures that may prevent or decrease complications (Diabetes Survival Skills Education) will improve Outcome: Progressing Goal: Individualized Educational Video(s) Outcome: Progressing   Problem: Coping: Goal: Ability to adjust to condition or change in health will improve Outcome: Progressing

## 2024-09-03 NOTE — Progress Notes (Signed)
   [address] [place] [address]             [phone]    POD # 8 AVR, MVR  Eating dinner  BP (!) 147/117   Pulse 70   Temp 98.2 F (36.8 C) (Oral)   Resp (!) 22   Ht 5' 1 (1.549 m)   Wt 103.4 kg   LMP 10/10/1996   SpO2 96%   BMI 43.07 kg/m   RA 94% sat  Milrinone  0.375, epi 1 Amiodarone  30   Intake/Output Summary (Last 24 hours) at 09/03/2024 1736 Last data filed at 09/03/2024 1700 Gross per 24 hour  Intake 1050.79 ml  Output 2600 ml  Net -1549.21 ml    Evening BMET pending  Elspeth C. Kerrin, MD [facility] [phone]

## 2024-09-03 NOTE — Progress Notes (Signed)
 Physical Therapy Treatment Patient Details Name: Katie Jacobson MRN: [id number] DOB: [birth year] Today's Date: 09/03/2024   History of Present Illness Pt is 68 yo presenting to [facility] on 11/17 for scheduled AVR/MVR. PMH: breast cancer (triple negative), rheumatic valve disease, severe AI, sever mitral regurgitation, HTN, HLD    PT Comments  Progressing towards acute functional goals. Min assist for bed mobility (returning to bed,) CGA to transfer and ambulate with reliance on RW for support. Very slow, required several standing rest breaks to complete. SpO2 91% on RA, up to 94% with 2L supplemental O2 while mobilizing. Patient will continue to benefit from skilled physical therapy services to further improve independence with functional mobility.     If plan is discharge home, recommend the following: Assist for transportation;Help with stairs or ramp for entrance;Assistance with cooking/housework;A little help with walking and/or transfers;A lot of help with bathing/dressing/bathroom   Can travel by private vehicle        Equipment Recommendations  Rolling walker (2 wheels);BSC/3in1    Recommendations for Other Services Rehab consult     Precautions / Restrictions Precautions Precautions: Sternal Precaution Booklet Issued: No Recall of Precautions/Restrictions: Intact Restrictions Weight Bearing Restrictions Per Provider Order: Yes (sternal precautions) Other Position/Activity Restrictions: sternal precautions     Mobility  Bed Mobility Overal bed mobility: Needs Assistance Bed Mobility: Rolling, Sit to Sidelying Rolling: Min assist       Sit to sidelying: Min assist General bed mobility comments: Min assist for LE support into bed and facilitate roll to center. Cues for technique, but maintains sternal precautions.    Transfers Overall transfer level: Needs assistance Equipment used: Rolling walker (2 wheels) Transfers: Sit to/from Stand Sit to Stand: Contact guard  assist           General transfer comment: CGA for safety to rise from recliner, and sit safely back onto bed at end of session. No physical assist required. Effortful but stable with light support from RW while upright. Maintains precautions crossing arms over chest.    Ambulation/Gait Ambulation/Gait assistance: Contact guard assist Gait Distance (Feet): 110 Feet Assistive device: Rolling walker (2 wheels) Gait Pattern/deviations: Step-through pattern, Decreased stride length, Trunk flexed Gait velocity: decreased Gait velocity interpretation: <1.31 ft/sec, indicative of household ambulator   General Gait Details: Intermittent cues to stand upright when stopping to rest. Requires 3 standing rest breaks to complete distance this afternoon. No overt buckling or LOB. SpO2 91% on RA, up to 94% with 2L.   Stairs             Wheelchair Mobility     Tilt Bed    Modified Rankin (Stroke Patients Only)       Balance Overall balance assessment: Needs assistance Sitting-balance support: Feet supported, No upper extremity supported Sitting balance-Leahy Scale: Fair Sitting balance - Comments: seated EOB   Standing balance support: No upper extremity supported Standing balance-Leahy Scale: Fair Standing balance comment: stands statically without UE support.                            Communication Communication Communication: No apparent difficulties  Cognition Arousal: Alert Behavior During Therapy: Anxious   PT - Cognitive impairments: No apparent impairments                         Following commands: Intact      Cueing Cueing Techniques: Verbal cues  Exercises  General Comments General comments (skin integrity, edema, etc.): SpO2 94% at rest on RA. Down to 91% on RA while ambulating, and ~92-94% on 2L while ambulating.      Pertinent Vitals/Pain Pain Assessment Pain Assessment: Faces Faces Pain Scale: Hurts even more Pain  Location: everything Pain Descriptors / Indicators: Aching, Discomfort, Moaning Pain Intervention(s): Limited activity within patient's tolerance, Monitored during session, Repositioned    Home Living                          Prior Function            PT Goals (current goals can now be found in the care plan section) Acute Rehab PT Goals Patient Stated Goal: to improve mobility/decrease pain PT Goal Formulation: With patient Time For Goal Achievement: 09/11/24 Potential to Achieve Goals: Good Progress towards PT goals: Progressing toward goals    Frequency    Min 2X/week      PT Plan      Co-evaluation              AM-PAC PT 6 Clicks Mobility   Outcome Measure  Help needed turning from your back to your side while in a flat bed without using bedrails?: A Little Help needed moving from lying on your back to sitting on the side of a flat bed without using bedrails?: A Little Help needed moving to and from a bed to a chair (including a wheelchair)?: A Little Help needed standing up from a chair using your arms (e.g., wheelchair or bedside chair)?: A Little Help needed to walk in hospital room?: A Little Help needed climbing 3-5 steps with a railing? : A Lot 6 Click Score: 17    End of Session Equipment Utilized During Treatment: Gait belt Activity Tolerance: Patient limited by pain;Patient limited by fatigue Patient left: with call bell/phone within reach;with nursing/sitter in room;with family/visitor present;in bed;with bed alarm set Nurse Communication: Mobility status PT Visit Diagnosis: Unsteadiness on feet (R26.81);Other abnormalities of gait and mobility (R26.89);Muscle weakness (generalized) (M62.81);Pain;Difficulty in walking, not elsewhere classified (R26.2) Pain - part of body:  (Everything)     Time: 8493-8469 PT Time Calculation (min) (ACUTE ONLY): 24 min  Charges:    $Gait Training: 8-22 mins $Therapeutic Activity: 8-22  mins PT General Charges $$ ACUTE PT VISIT: 1 Visit                     Leontine Roads, PT, DPT [facility]  Rehabilitation Services Physical Therapist Office: [phone] Website: [email or web address]    Leontine GORMAN Roads 09/03/2024, 4:24 PM

## 2024-09-03 NOTE — Progress Notes (Signed)
 VAST consult received to assess/declot white r flushlumen (#3) of LA TL PICC. Assessed line and changed cap to white lumen, but still unable to obtain blood return or flush. Attempted to administer alteplase  beginning at 1155; unable to instill any drug initially. At 1205 administered 0.36mLs; at 1225 administered 0.1mLs and at 1330, able to instill remaining medicine to dwell for at least 2 hours.

## 2024-09-03 NOTE — Progress Notes (Addendum)
 Patient ID: Katie Jacobson, female   DOB: Feb 09, 1956, 68 y.o.   MRN: [id number]     Advanced Heart Failure Rounding Note  Cardiologist: Alm Clay, MD  Chief Complaint: CHF Subjective:    POD# 8   Remains on NE 0.375 and epi 1. Co-ox 63%  3.6L in UOP w/ IV Lasix  yesterday. Remains volume overloaded. CVP 13.  SCr 1.23>>1.04   Still in AFL, 80s.   Feeling better. Ambulated the unit earlier this morning and did ok. No resting dyspnea.    Post-op echo showed LV EF 65%, mild RV dilation with normal systolic function but there is likely severe tricuspid regurgitation, s/p bioprosthetic MV replacement with mean gradient 4, s/p bioprosthetic AV replacement with mean gradient 20.    Objective:   Weight Range: 103.4 kg Body mass index is 43.07 kg/m.   Vital Signs:   Temp:  [97.7 F (36.5 C)-98.4 F (36.9 C)] 98.2 F (36.8 C) (11/25 1145) Pulse Rate:  [75-104] 86 (11/25 1145) Resp:  [12-41] 19 (11/25 1145) BP: (93-153)/(50-117) 127/75 (11/25 1140) SpO2:  [66 %-100 %] 95 % (11/25 1145) Weight:  [103.4 kg] 103.4 kg (11/25 0500) Last BM Date : 09/02/24  Weight change: Filed Weights   09/01/24 0545 09/02/24 0500 09/03/24 0500  Weight: 103.1 kg 103.2 kg 103.4 kg    Intake/Output:   Intake/Output Summary (Last 24 hours) at 09/03/2024 1213 Last data filed at 09/03/2024 1200 Gross per 24 hour  Intake 1448.1 ml  Output 2500 ml  Net -1051.9 ml      Physical Exam   CVP 13  GENERAL: NAD Lungs- decreased bs at the bases bilaterally  CARDIAC:  JVP 13 cm      Irregular rhythm and rate, no MRG, 2+ b/l LEE  ABDOMEN: Soft, non-tender, non-distended.  EXTREMITIES: Warm and well perfused.  NEUROLOGIC: No obvious FND   Telemetry   AFL 80s, Personally reviewed   Labs    CBC Recent Labs    09/02/24 0430 09/03/24 0434  WBC 9.1 7.8  HGB 8.8* 8.1*  HCT 26.1* 23.6*  MCV 90.9 92.2  PLT 139* 154   Basic Metabolic Panel Recent Labs    88/75/74 0430 09/02/24 1407  09/03/24 0434  NA 131* 134* 131*  K 3.2* 3.4* 3.7  CL 88* 92* 90*  CO2 29 29 29   GLUCOSE 135* 143* 207*  BUN 19 17 16   CREATININE 1.25* 1.23* 1.04*  CALCIUM  7.9* 8.1* 8.1*  MG 2.1  --  1.9   Liver Function Tests No results for input(s): AST, ALT, ALKPHOS, BILITOT, PROT, ALBUMIN  in the last 72 hours. No results for input(s): LIPASE, AMYLASE in the last 72 hours. Cardiac Enzymes No results for input(s): CKTOTAL, CKMB, CKMBINDEX, TROPONINI in the last 72 hours.  BNP: BNP (last 3 results) No results for input(s): BNP in the last 8760 hours.  ProBNP (last 3 results) No results for input(s): PROBNP in the last 8760 hours.   D-Dimer No results for input(s): DDIMER in the last 72 hours. Hemoglobin A1C No results for input(s): HGBA1C in the last 72 hours. Fasting Lipid Panel No results for input(s): CHOL, HDL, LDLCALC, TRIG, CHOLHDL, LDLDIRECT in the last 72 hours. Thyroid  Function Tests No results for input(s): TSH, T4TOTAL, T3FREE, THYROIDAB in the last 72 hours.  Invalid input(s): FREET3  Other results:   Imaging    No results found.     Medications:     Scheduled Medications:  sodium chloride    Intravenous Once  alteplase        alteplase   2 mg Intracatheter Once   apixaban   5 mg Oral BID   aspirin  EC  81 mg Oral Daily   bisacodyl   10 mg Oral Daily   Or   bisacodyl   10 mg Rectal Daily   Chlorhexidine  Gluconate Cloth  6 each Topical Daily   feeding supplement  237 mL Oral BID BM   furosemide   80 mg Intravenous BID   insulin  aspart  0-6 Units Subcutaneous Q4H   lidocaine   1 patch Transdermal Q24H   pantoprazole   40 mg Oral Daily   potassium chloride   40 mEq Oral Once   sodium chloride  flush  10-40 mL Intracatheter Q12H   sodium chloride  flush  3 mL Intravenous Q12H   spironolactone   12.5 mg Oral Daily    Infusions:  amiodarone  30 mg/hr (09/03/24 1200)   epinephrine  1 mcg/min (09/03/24 1200)    milrinone  0.375 mcg/kg/min (09/03/24 1200)   promethazine  (PHENERGAN ) injection (IM or IVPB) Stopped (08/31/24 1154)    PRN Medications: acetaminophen , albuterol , alteplase , ondansetron  (ZOFRAN ) IV, mouth rinse, promethazine  (PHENERGAN ) injection (IM or IVPB), simethicone , sodium chloride  flush, sodium chloride  flush    Assessment/Plan   1. Rheumatic heart disease: Severe MR and severe AI on 9/25 TEE with EF 55-60%. S/p AVR with 21 mm Inspiris valve, MVR with 27 mm Mitris valve and LAA ligation by Dr. Lanetta Figuero on 08/26/24. Post-op echo (11/20) showed LV EF 65%, mild RV dilation with normal systolic function but there is likely severe tricuspid regurgitation, s/p bioprosthetic MV replacement with mean gradient 4, s/p bioprosthetic AV replacement with mean gradient 20.   2. Post-op cardiogenic shock/RV failure:  Echo post-op on 11/20 showed LV EF 65%, mild RV dilation with normal systolic function but there is likely severe tricuspid regurgitation, s/p bioprosthetic MV replacement with mean gradient 4, s/p bioprosthetic AV replacement with mean gradient 20.  - Co-ox 63% on  milrinone  0.375 and epi 1 - Diuresing but remains volume overloaded, CVP 13  - Increase IV Lasix  to 80 mg bid + give K supp - continue UNNA boots  - continue Milrinone  and NE at current doses until fully diuresed  3. Atrial flutter: Atypical, noted post-op. She is currently in AFL, rate controlled  - Continue amiodarone  gtt.  - Continue Eliquis  5 mg bid  - Can consider eventual DC-CV once fully diuresed  4. AKI: Creatinine up 1.2 => 1.49 => 1.4 -> 1.3  -> 1.5->1.25 ->1.04  5. Anemia: Post-op.  Transfuse hgb < 8.  8.1 today  6. Hypokalemia - K 3.7 - give K supp w/ diuresis  7. Post-op ileus - improved with Reglan   CRITICAL CARE Performed by: Caffie Shed   Total critical care time: 15 minutes  Critical care time was exclusive of separately billable procedures and treating other patients.  Critical care was  necessary to treat or prevent imminent or life-threatening deterioration.  Critical care was time spent personally by me on the following activities: development of treatment plan with patient and/or surrogate as well as nursing, discussions with consultants, evaluation of patient's response to treatment, examination of patient, obtaining history from patient or surrogate, ordering and performing treatments and interventions, ordering and review of laboratory studies, ordering and review of radiographic studies, pulse oximetry and re-evaluation of patient's condition.   Length of Stay: [address], [place]  09/03/2024, 12:13 PM  Advanced Heart Failure Team Pager [phone] (M-F; 7a - 5p)  Please contact [facility] for night-coverage  after hours (5p -7a ) and weekends on [email or web address]   Agree with above  Feels ok today. Remains on milrinone  0.375 and epi 1. Coox 63% CVP 13.   Feels ok. Ileus resolving. Decent diuresis with lasix  80 IV x 1 yesterday. Scr improved.   Remains in rate controlled AFL  General:  Sitting up in bed. No resp difficulty HEENT: normal Neck: supple. JVP to ear Cor: Irregular rate & rhythm. No rubs, gallops or murmurs. Lungs: clear Abdomen: soft, nontender, nondistended.Good bowel sounds. Extremities: no cyanosis, clubbing, rash, 2+ edema Neuro: alert & orientedx3, cranial nerves grossly intact. moves all 4 extremities w/o difficulty. Affect pleasant  She remains volume overloaded. Will give lasix  80 IV bid today (d/w Dr. Aivah Putman). Will continue epi and milrinone  until fully diuresed.   Now on Eliquis . Can consider DC-CV prior to d/c.   CRITICAL CARE Performed by: Cherrie Sieving  Total critical care time: 38 minutes  Critical care time was exclusive of separately billable procedures and treating other patients.  Critical care was necessary to treat or prevent imminent or life-threatening deterioration.  Critical care was time spent personally by me  (independent of midlevel providers or residents) on the following activities: development of treatment plan with patient and/or surrogate as well as nursing, discussions with consultants, evaluation of patient's response to treatment, examination of patient, obtaining history from patient or surrogate, ordering and performing treatments and interventions, ordering and review of laboratory studies, ordering and review of radiographic studies, pulse oximetry and re-evaluation of patient's condition.  Sieving Cherrie, MD  1:57 PM

## 2024-09-03 NOTE — TOC Initial Note (Signed)
 Transition of Care [facility]) - Initial/Assessment Note    Patient Details  Name: Katie Jacobson MRN: [id number] Date of Birth: 1956-01-14  Transition of Care [facility]) CM/SW Contact:    Justina Delcia Czar, RN Phone Number: [phone] 09/03/2024, 3:35 PM  Clinical Narrative:                 Spoke to pt at bedside. States husband at home to assist with care. Offered choice for [facility]. Pt agreeable to [facility] with [facility]. Medicare.gov listing with ratings placed on chart.   Notified by [facility] liaison -following patient with [facility]CTS office protocol referral prearranged for [facility] needs if needed.  Will need PT/OT evaluation and recommendation.    Expected Discharge Plan: Home w Home Health Services Barriers to Discharge: Continued Medical Work up   Patient Goals and CMS Choice Patient states their goals for this hospitalization and ongoing recovery are:: wants to recover CMS Medicare.gov Compare Post Acute Care list provided to:: Patient Choice offered to / list presented to : Patient      Expected Discharge Plan and Services   Discharge Planning Services: CM Consult Post Acute Care Choice: Home Health Living arrangements for the past 2 months: Single Family Home                             HH Agency: [facility] Date [facility] Agency Contacted: 09/03/24 Time HH Agency Contacted: [phone] Representative spoke with at [facility] Agency: Zebedee Braver  Prior Living Arrangements/Services Living arrangements for the past 2 months: Single Family Home Lives with:: Spouse Patient language and need for interpreter reviewed:: Yes Do you feel safe going back to the place where you live?: Yes      Need for Family Participation in Patient Care: Yes (Comment) Care giver support system in place?: Yes (comment)   Criminal Activity/Legal Involvement Pertinent to Current Situation/Hospitalization: No - Comment as needed  Activities of Daily Living      Permission  Sought/Granted Permission sought to share information with : Case Manager, Family Supports, PCP Permission granted to share information with : Yes, Verbal Permission Granted  Share Information with NAME: Elveria Lauderbaugh  Permission granted to share info w AGENCY: Home Health, DME, PCP  Permission granted to share info w Relationship: husband  Permission granted to share info w Contact Information: [phone]  Emotional Assessment Appearance:: Appears stated age Attitude/Demeanor/Rapport: Engaged Affect (typically observed): Accepting Orientation: : Oriented to Self, Oriented to Place, Oriented to  Time, Oriented to Situation   Psych Involvement: No (comment)  Admission diagnosis:  Severe mitral regurgitation [I34.0] Mitral valve stenosis, unspecified etiology [I05.0] Severe aortic insufficiency [I35.1] S/P AVR (aortic valve replacement) [Z95.2] Patient Active Problem List   Diagnosis Date Noted   Right ventricular dysfunction 09/03/2024   Hypervolemia 09/03/2024   S/P MVR (mitral valve repair) 08/29/2024   S/P AVR 08/29/2024   Intractable nausea and vomiting 08/29/2024   S/P AVR (aortic valve replacement) 08/26/2024   Severe aortic insufficiency 08/26/2024   Endotracheal tube present 08/26/2024   AKI (acute kidney injury) 01/26/2023   Breast cancer (HCC) 12/15/2022   Bilateral malignant neoplasm of breast in female [facility]) 11/02/2022   Allergic reaction to chemical substance 01/30/2022   Preventative health care 01/28/2022   Cervical radiculopathy 04/20/2021   Primary osteoarthritis of left knee 03/09/2021   Vitamin D  deficiency 07/29/2019   Smoking greater than 40 pack years 07/27/2019   Class 3 severe obesity  due to excess calories without serious comorbidity in adult [facility]) 07/26/2019   Genetic testing 01/25/2016   Microscopic hematuria 09/06/2015   Vaginal dryness 12/09/2014   Family history of breast cancer    Malignant neoplasm of upper-outer quadrant of right breast in  female, estrogen receptor negative (HCC) 11/14/2014   Intermittent palpitations    Obesity (BMI 35.0-39.9 without comorbidity)    Hyperlipidemia LDL goal <100 07/07/2011   History of partial seizures 04/01/2011   Asthma 04/01/2011   Hot flashes 04/01/2011   Tobacco abuse 04/01/2011   Essential hypertension 04/01/2011   Rheumatic mitral and aortic valve regurgitation 04/01/2011   PCP:  Daryl Setter, NP Pharmacy:   [id number] - [place], [place] - [address] [place] [address] Phone: [phone] Fax: [phone]     Social Drivers of Health (SDOH) Social History: SDOH Screenings   Food Insecurity: Unknown (08/28/2024)  Housing: Low Risk  (08/28/2024)  Transportation Needs: No Transportation Needs (08/29/2024)  Utilities: Not At Risk (08/29/2024)  Depression (PHQ2-9): Low Risk  (12/27/2023)  Social Connections: Unknown (08/28/2024)  Tobacco Use: High Risk (08/26/2024)   SDOH Interventions: Housing Interventions: Intervention Not Indicated Transportation Interventions: Intervention Not Indicated Utilities Interventions: Intervention Not Indicated Social Connections Interventions: Intervention Not Indicated   Readmission Risk Interventions     No data to display

## 2024-09-04 ENCOUNTER — Inpatient Hospital Stay (HOSPITAL_COMMUNITY)

## 2024-09-04 DIAGNOSIS — N179 Acute kidney failure, unspecified: Secondary | ICD-10-CM | POA: Diagnosis not present

## 2024-09-04 DIAGNOSIS — J9811 Atelectasis: Secondary | ICD-10-CM | POA: Diagnosis not present

## 2024-09-04 DIAGNOSIS — E8770 Fluid overload, unspecified: Secondary | ICD-10-CM | POA: Diagnosis not present

## 2024-09-04 DIAGNOSIS — J90 Pleural effusion, not elsewhere classified: Secondary | ICD-10-CM | POA: Diagnosis not present

## 2024-09-04 DIAGNOSIS — I340 Nonrheumatic mitral (valve) insufficiency: Secondary | ICD-10-CM | POA: Diagnosis not present

## 2024-09-04 DIAGNOSIS — Z48812 Encounter for surgical aftercare following surgery on the circulatory system: Secondary | ICD-10-CM | POA: Diagnosis not present

## 2024-09-04 DIAGNOSIS — I351 Nonrheumatic aortic (valve) insufficiency: Secondary | ICD-10-CM | POA: Diagnosis not present

## 2024-09-04 DIAGNOSIS — R918 Other nonspecific abnormal finding of lung field: Secondary | ICD-10-CM | POA: Diagnosis not present

## 2024-09-04 LAB — CBC
HCT: 25.3 % — ABNORMAL LOW (ref 36.0–46.0)
Hemoglobin: 8.7 g/dL — ABNORMAL LOW (ref 12.0–15.0)
MCH: 31.4 pg (ref 26.0–34.0)
MCHC: 34.4 g/dL (ref 30.0–36.0)
MCV: 91.3 fL (ref 80.0–100.0)
Platelets: 196 K/uL (ref 150–400)
RBC: 2.77 MIL/uL — ABNORMAL LOW (ref 3.87–5.11)
RDW: 17.5 % — ABNORMAL HIGH (ref 11.5–15.5)
WBC: 7.9 K/uL (ref 4.0–10.5)
nRBC: 1 % — ABNORMAL HIGH (ref 0.0–0.2)

## 2024-09-04 LAB — BASIC METABOLIC PANEL WITH GFR
Anion gap: 12 (ref 5–15)
BUN: 15 mg/dL (ref 8–23)
CO2: 29 mmol/L (ref 22–32)
Calcium: 8.1 mg/dL — ABNORMAL LOW (ref 8.9–10.3)
Chloride: 92 mmol/L — ABNORMAL LOW (ref 98–111)
Creatinine, Ser: 1.19 mg/dL — ABNORMAL HIGH (ref 0.44–1.00)
GFR, Estimated: 51 mL/min — ABNORMAL LOW (ref >60)
Glucose, Bld: 157 mg/dL — ABNORMAL HIGH (ref 70–99)
Potassium: 3.6 mmol/L (ref 3.5–5.1)
Sodium: 133 mmol/L — ABNORMAL LOW (ref 135–145)

## 2024-09-04 LAB — COOXEMETRY PANEL
Carboxyhemoglobin: 1.7 % — ABNORMAL HIGH (ref 0.5–1.5)
Methemoglobin: <0.7 % (ref 0.0–1.5)
O2 Saturation: 60.4 %
Total hemoglobin: 8.6 g/dL — ABNORMAL LOW (ref 12.0–16.0)

## 2024-09-04 LAB — MAGNESIUM: Magnesium: 2 mg/dL (ref 1.7–2.4)

## 2024-09-04 LAB — GLUCOSE, CAPILLARY
Glucose-Capillary: 116 mg/dL — ABNORMAL HIGH (ref 70–99)
Glucose-Capillary: 129 mg/dL — ABNORMAL HIGH (ref 70–99)

## 2024-09-04 MED ORDER — OXYCODONE HCL 5 MG PO TABS
10.0000 mg | ORAL_TABLET | ORAL | Status: DC | PRN
  Administered 2024-09-12: 10 mg via ORAL
  Filled 2024-09-04: qty 2

## 2024-09-04 MED ORDER — SENNA 8.6 MG PO TABS
1.0000 | ORAL_TABLET | Freq: Every day | ORAL | Status: DC
  Administered 2024-09-04 – 2024-09-08 (×5): 8.6 mg via ORAL
  Filled 2024-09-04 (×7): qty 1

## 2024-09-04 MED ORDER — POTASSIUM CHLORIDE CRYS ER 20 MEQ PO TBCR
40.0000 meq | EXTENDED_RELEASE_TABLET | Freq: Once | ORAL | Status: DC
  Filled 2024-09-04 (×2): qty 2

## 2024-09-04 MED ORDER — POTASSIUM CHLORIDE 10 MEQ/50ML IV SOLN
10.0000 meq | INTRAVENOUS | Status: AC
  Administered 2024-09-04 (×5): 10 meq via INTRAVENOUS
  Filled 2024-09-04 (×5): qty 50

## 2024-09-04 MED ORDER — SPIRONOLACTONE 25 MG PO TABS
25.0000 mg | ORAL_TABLET | Freq: Every day | ORAL | Status: DC
  Administered 2024-09-04 – 2024-09-12 (×9): 25 mg via ORAL
  Filled 2024-09-04 (×9): qty 1

## 2024-09-04 MED ORDER — HYDROMORPHONE HCL 1 MG/ML IJ SOLN
0.5000 mg | INTRAMUSCULAR | Status: DC | PRN
  Administered 2024-09-04: 1 mg via INTRAVENOUS
  Filled 2024-09-04: qty 1

## 2024-09-04 MED ORDER — POLYETHYLENE GLYCOL 3350 17 G PO PACK: 17.0000 g | PACK | Freq: Every day | ORAL | Status: DC | PRN

## 2024-09-04 MED ORDER — SPIRONOLACTONE 25 MG PO TABS: 25.0000 mg | ORAL_TABLET | Freq: Every day | ORAL | Status: DC

## 2024-09-04 MED ORDER — HYDROXYZINE HCL 10 MG PO TABS
10.0000 mg | ORAL_TABLET | Freq: Three times a day (TID) | ORAL | Status: DC | PRN
  Administered 2024-09-05 – 2024-09-07 (×2): 10 mg via ORAL
  Filled 2024-09-04 (×3): qty 1

## 2024-09-04 MED ORDER — FUROSEMIDE 10 MG/ML IJ SOLN
80.0000 mg | Freq: Once | INTRAMUSCULAR | Status: AC
  Administered 2024-09-04: 80 mg via INTRAVENOUS
  Filled 2024-09-04: qty 8

## 2024-09-04 MED ORDER — POTASSIUM CHLORIDE 20 MEQ PO PACK
20.0000 meq | PACK | ORAL | Status: DC
  Administered 2024-09-04: 20 meq
  Filled 2024-09-04: qty 1

## 2024-09-04 MED ORDER — POLYETHYLENE GLYCOL 3350 17 G PO PACK
17.0000 g | PACK | Freq: Every day | ORAL | Status: DC
  Administered 2024-09-04 – 2024-09-08 (×5): 17 g via ORAL
  Filled 2024-09-04 (×7): qty 1

## 2024-09-04 MED ORDER — ORAL CARE MOUTH RINSE: 15.0000 mL | OROMUCOSAL | Status: DC | PRN

## 2024-09-04 NOTE — Progress Notes (Signed)
 9 Days Post-Op Procedure(s) (LRB): REPLACEMENT, AORTIC VALVE, OPEN, UTILIZING INSPIRIS RESILIA  AORTIC VALVE (N/A) REPLACEMENT, MITRAL VALVE, UTILIZING MITRIS RESILIA MITRAL VALVE (N/A) CLIPPING, LEFT ATRIAL APPENDAGE UTILIZING ATRICURE PRO 40 (N/A) ECHOCARDIOGRAM, TRANSESOPHAGEAL, INTRAOPERATIVE (N/A) Subjective: Writhing this morning, seems like gas pain - abdominal exam benign Ambulated well this morning Continues to saturate well off nasal cannula  Objective: Vital signs in last 24 hours: BP (!) 142/93   Pulse 81   Temp 97.9 F (36.6 C) (Oral)   Resp (!) 22   Ht 5' 1 (1.549 m)   Wt 100.3 kg   LMP 10/10/1996   SpO2 94%   BMI 41.78 kg/m  Filed Weights   09/02/24 0500 09/03/24 0500 09/04/24 0400  Weight: 103.2 kg 103.4 kg 100.3 kg    Hemodynamic parameters for last 24 hours: CVP:  [9 mmHg-72 mmHg] 15 mmHg  Intake/Output from previous day: 11/25 0701 - 11/26 0700 In: 1230 [P.O.:460; I.V.:720; IV Piggyback:50] Out: 3350 [Urine:3350] Intake/Output this shift: No intake/output data recorded.  Physical Exam: General - Sitting in chair, appears uncomfortable CV - RRR, looks mostly sinus Resp - Unlabored on RA Abd - Soft, no distention, non-tender to deep palpation Ext - Minimal edema  Lab Results:    Latest Ref Rng & Units 09/04/2024    3:50 AM 09/03/2024    4:34 AM 09/02/2024    4:30 AM  CBC  WBC 4.0 - 10.5 K/uL 7.9  7.8  9.1   Hemoglobin 12.0 - 15.0 g/dL 8.7  8.1  8.8   Hematocrit 36.0 - 46.0 % 25.3  23.6  26.1   Platelets 150 - 400 K/uL 196  154  139       Latest Ref Rng & Units 09/04/2024    3:50 AM 09/03/2024    4:55 PM 09/03/2024    4:34 AM  CMP  Glucose 70 - 99 mg/dL 842  852  792   BUN 8 - 23 mg/dL 15  15  16    Creatinine 0.44 - 1.00 mg/dL 8.80  8.85  8.95   Sodium 135 - 145 mmol/L 133  133  131   Potassium 3.5 - 5.1 mmol/L 3.6  4.2  3.7   Chloride 98 - 111 mmol/L 92  92  90   CO2 22 - 32 mmol/L 29  31  29    Calcium  8.9 - 10.3 mg/dL  8.1  8.4  8.1     CXR: Stable  Assessment/Plan: S/P Procedure(s) (LRB): REPLACEMENT, AORTIC VALVE, OPEN, UTILIZING INSPIRIS RESILIA  AORTIC VALVE (N/A) REPLACEMENT, MITRAL VALVE, UTILIZING MITRIS RESILIA MITRAL VALVE (N/A) CLIPPING, LEFT ATRIAL APPENDAGE UTILIZING ATRICURE PRO 40 (N/A) ECHOCARDIOGRAM, TRANSESOPHAGEAL, INTRAOPERATIVE (N/A) POD8 s/p bioAVR/MVR with LAAL  Wean inotropes today, hopefully epi off Continue gentle diuresis per HF team if needed but appears fairly euvolemic Continue Eliquis  for A flutter Encourage ambulation, continue to monitor abdominal exam Stop ISS and glucose checks  Dispo: ICU   LOS: 9 days    Con RAMAN Yareli Carthen 09/04/2024

## 2024-09-04 NOTE — Progress Notes (Signed)
   09/04/24 1334  Provider Notification  Provider Name/Title Dr. Daniel  Date Provider Notified 09/04/24  Time Provider Notified 1334  Method of Notification Page  Notification Reason Fall  Provider response Other (Comment) (Provider in procedure)  Follow Up  Family notified Yes - comment  Time family notified 1335  Additional tests No  Simple treatment Other (comment) (No treatment necessary, patient was not harmed at all)  Progress note created (see row info) Yes

## 2024-09-04 NOTE — Progress Notes (Signed)
 Physical Therapy Treatment Patient Details Name: Katie Jacobson MRN: [id number] DOB: [birth year] Today's Date: 09/04/2024   History of Present Illness Pt is 68 yo presenting to [facility]/17 for scheduled AVR/MVR. PMH: breast cancer (triple negative), rheumatic valve disease, severe AI, sever mitral regurgitation, HTN, HLD    PT Comments  Pt progressing towards goals. Currently pt is CGA for bed mobility, supervision for sit to stand and supervision to Cga for gait. Unable to get BP this session when pt reporting fatigue/nausea after gait due to BP not taking from portable box. RN/tech notified. Pt requires some encouragement to perform activities more independently and requires occasional cues to maintain sternal precautions. Due to pt current functional status, home set up and available assistance at home recommending skilled physical therapy services > 3 hours/day in order to address strength, balance and functional mobility to decrease risk for falls, injury, immobility, skin break down and re-hospitalization.      If plan is discharge home, recommend the following: Assist for transportation;Help with stairs or ramp for entrance;Assistance with cooking/housework;A little help with walking and/or transfers;A lot of help with bathing/dressing/bathroom     Equipment Recommendations  Rolling walker (2 wheels);BSC/3in1       Precautions / Restrictions Precautions Precautions: Sternal Precaution Booklet Issued: No Recall of Precautions/Restrictions: Intact Restrictions Weight Bearing Restrictions Per Provider Order: No RUE Weight Bearing Per Provider Order: Partial weight bearing LUE Weight Bearing Per Provider Order: Partial weight bearing Other Position/Activity Restrictions: sternal precautions     Mobility  Bed Mobility Overal bed mobility: Needs Assistance Bed Mobility: Rolling, Sit to Sidelying Rolling: Supervision       Sit to sidelying: Contact guard assist General bed  mobility comments: pt requires cues for sequencing and encouragement to perform sit to side lying without physical assistance. Includign to perform one leg at a time.    Transfers Overall transfer level: Needs assistance Equipment used: Rolling walker (2 wheels) Transfers: Sit to/from Stand Sit to Stand: Supervision           General transfer comment: supervision for safety with sit to stand, occasional verbal cues to maintain sternal precuations.    Ambulation/Gait Ambulation/Gait assistance: Contact guard assist, Supervision Gait Distance (Feet): 120 Feet Assistive device: Rolling walker (2 wheels) Gait Pattern/deviations: Step-through pattern, Decreased stride length Gait velocity: decreased Gait velocity interpretation: <1.31 ft/sec, indicative of household ambulator   General Gait Details: improved posture this session. only 1x standing rest break this session and no sitting rest breqaks. SPO2 and Hr WNl on room air.      Balance Overall balance assessment: Needs assistance Sitting-balance support: Feet supported, No upper extremity supported Sitting balance-Leahy Scale: Fair Sitting balance - Comments: seated EOB   Standing balance support: No upper extremity supported Standing balance-Leahy Scale: Fair Standing balance comment: stands statically without UE support.          Communication Communication Communication: No apparent difficulties  Cognition Arousal: Alert Behavior During Therapy: Anxious   PT - Cognitive impairments: No apparent impairments     Following commands: Intact      Cueing Cueing Techniques: Verbal cues     General Comments General comments (skin integrity, edema, etc.): Pt reports fatigue and needing to lay down immediately due to feeling ilke she will be sick. Unable to get BP due to pressed button 3x and BP did not take. BP taking when box attached to wall monitor. RN notified.      Pertinent Vitals/Pain Pain Assessment Pain  Assessment:  Faces Faces Pain Scale: Hurts even more Facial Expression: Grimacing Body Movements: Absence of movements Muscle Tension: Tense, rigid Compliance with ventilator (intubated pts.): N/A Vocalization (extubated pts.): Sighing, moaning CPOT Total: 4 Pain Location: back Pain Descriptors / Indicators: Aching, Discomfort, Moaning Pain Intervention(s): Monitored during session, Limited activity within patient's tolerance     PT Goals (current goals can now be found in the care plan section) Acute Rehab PT Goals Patient Stated Goal: to improve mobility/decrease pain PT Goal Formulation: With patient Time For Goal Achievement: 09/11/24 Potential to Achieve Goals: Good Progress towards PT goals: Progressing toward goals    Frequency    Min 2X/week      PT Plan  Continue with current POC        AM-PAC PT 6 Clicks Mobility   Outcome Measure  Help needed turning from your back to your side while in a flat bed without using bedrails?: A Little Help needed moving from lying on your back to sitting on the side of a flat bed without using bedrails?: A Little Help needed moving to and from a bed to a chair (including a wheelchair)?: A Little Help needed standing up from a chair using your arms (e.g., wheelchair or bedside chair)?: A Little Help needed to walk in hospital room?: A Little Help needed climbing 3-5 steps with a railing? : A Lot 6 Click Score: 17    End of Session Equipment Utilized During Treatment: Gait belt Activity Tolerance: Patient limited by pain;Patient limited by fatigue Patient left: with call bell/phone within reach;with nursing/sitter in room;in bed;with bed alarm set Nurse Communication: Mobility status PT Visit Diagnosis: Unsteadiness on feet (R26.81);Other abnormalities of gait and mobility (R26.89);Muscle weakness (generalized) (M62.81);Pain;Difficulty in walking, not elsewhere classified (R26.2) Pain - part of body:  (lowback)     Time:  8851-8772 PT Time Calculation (min) (ACUTE ONLY): 39 min  Charges:    $Therapeutic Activity: 38-52 mins PT General Charges $$ ACUTE PT VISIT: 1 Visit                     Dorothyann Maier, DPT, CLT  Acute Rehabilitation Services Office: [phone] (Secure chat preferred)    Dorothyann VEAR Maier 09/04/2024, 12:33 PM

## 2024-09-04 NOTE — Progress Notes (Signed)
 IP rehab admissions - Patient continues on amiodarone  drip and not medically ready for inpatient rehab admissions.  Will have my partners follow up for progress on Friday.  [phone]

## 2024-09-04 NOTE — Progress Notes (Signed)
 Occupational Therapy Treatment Patient Details Name: Katie Jacobson MRN: [id number] DOB: 1956-05-18 Today's Date: 09/04/2024   History of present illness Pt is 68 yo presenting to [facility] on 11/17 for scheduled AVR/MVR. PMH: breast cancer (triple negative), rheumatic valve disease, severe AI, sever mitral regurgitation, HTN, HLD   OT comments  Pt received in recliner c/o of back pain. Pt restless in recliner, while OT began introducing herself, pt sat on the ledge of the recliner, to which the recliner tilted and propelled pt forward. OT assisted pt to her feet (see general comments for additional notes). OT performed brief screening, noted no apparent injuries, pt denying any injuries. RN notified. Pt agreeable to work on ADLs within sternal precautions. Mobilized to bathroom at [facility], and performed pericare at [facility], with good adherence to sternal precautions. Pt continues to be limited by decreased activity tolerance. Continue to recommend >3 hours of skilled rehab daily to optimize independence levels. Will continue to follow acutely.       If plan is discharge home, recommend the following:  A lot of help with walking and/or transfers;A lot of help with bathing/dressing/bathroom;Assistance with cooking/housework   Equipment Recommendations  Other (comment) (Defer)       Precautions / Restrictions Precautions Precautions: Sternal Precaution Booklet Issued: No Recall of Precautions/Restrictions: Intact Restrictions Weight Bearing Restrictions Per Provider Order: No Other Position/Activity Restrictions: sternal precautions       Mobility Bed Mobility       General bed mobility comments: Received in recliner    Transfers Overall transfer level: Needs assistance Equipment used: Rolling walker (2 wheels) Transfers: Sit to/from Stand Sit to Stand: Contact guard assist           General transfer comment: CGA to rise with good use of rocking technique     Balance Overall  balance assessment: Needs assistance Sitting-balance support: Feet supported, No upper extremity supported Sitting balance-Leahy Scale: Fair     Standing balance support: No upper extremity supported Standing balance-Leahy Scale: Fair         ADL either performed or assessed with clinical judgement   ADL Overall ADL's : Needs assistance/impaired       Toilet Transfer: Contact guard assist;Rolling walker (2 wheels);Regular Teacher, Adult Education Details (indicate cue type and reason): CGA for balance Toileting- Clothing Manipulation and Hygiene: Contact guard assist;Sit to/from stand Toileting - Clothing Manipulation Details (indicate cue type and reason): Good use of compensatory strategy to adhere to precautions     Functional mobility during ADLs: Contact guard assist;Rolling walker (2 wheels) General ADL Comments: Pt limited by pain but good skill carryover    Extremity/Trunk Assessment Upper Extremity Assessment Upper Extremity Assessment: Generalized weakness   Lower Extremity Assessment Lower Extremity Assessment: Defer to PT evaluation        Vision   Vision Assessment?: No apparent visual deficits         Communication Communication Communication: No apparent difficulties   Cognition Arousal: Alert Behavior During Therapy: Restless Cognition: No apparent impairments             OT - Cognition Comments: Pt restless d/t pain, frequently moving from side to side     Following commands: Intact        Cueing   Cueing Techniques: Verbal cues        General Comments OT entered room and pt was seated in recliner with feet up. Pt c/o of pain and wiggling in recliner. As OT began to introduce herself, pt sat  up causing chair to tilt and propelled pt forward. Pt caught herself in a squatted position. OT assisted pt to standing, and walked her back to recliner (as it was the nearest chair). Quick screen was performed and no noticeable injuries. RN was  notifie    Pertinent Vitals/ Pain       Pain Assessment Pain Assessment: 0-10 Pain Score: 10-Worst pain ever Faces Pain Scale: Hurts worst Pain Location: back Pain Descriptors / Indicators: Aching, Discomfort, Moaning Pain Intervention(s): Limited activity within patient's tolerance   Frequency  Min 2X/week        Progress Toward Goals  OT Goals(current goals can now be found in the care plan section)  Progress towards OT goals: Progressing toward goals  Acute Rehab OT Goals Patient Stated Goal: To get hot tea OT Goal Formulation: With patient Time For Goal Achievement: 09/12/24 Potential to Achieve Goals: Good ADL Goals Pt Will Perform Grooming: with supervision;standing Pt Will Perform Upper Body Dressing: with supervision;sitting Pt Will Perform Lower Body Dressing: with supervision;sit to/from stand Pt Will Transfer to Toilet: with supervision;ambulating;regular height toilet Pt Will Perform Toileting - Clothing Manipulation and hygiene: with supervision;sit to/from stand;sitting/lateral leans Additional ADL Goal #1: Pt will verbalize at least 3 energy conservation strategies for ADLs  Plan         AM-PAC OT 6 Clicks Daily Activity     Outcome Measure   Help from another person eating meals?: None Help from another person taking care of personal grooming?: A Little Help from another person toileting, which includes using toliet, bedpan, or urinal?: A Little Help from another person bathing (including washing, rinsing, drying)?: A Little Help from another person to put on and taking off regular upper body clothing?: A Little Help from another person to put on and taking off regular lower body clothing?: A Little 6 Click Score: 19    End of Session Equipment Utilized During Treatment: Rolling walker (2 wheels)  OT Visit Diagnosis: Unsteadiness on feet (R26.81);Other abnormalities of gait and mobility (R26.89);Muscle weakness (generalized) (M62.81)   Activity  Tolerance Patient limited by pain   Patient Left with call bell/phone within reach;in chair;with nursing/sitter in room   Nurse Communication Mobility status        Time: 0912-0940 OT Time Calculation (min): 28 min  Charges: OT General Charges $OT Visit: 1 Visit OT Treatments $Self Care/Home Management : 23-37 mins  Adrianne BROCKS, OT  Acute Rehabilitation Services Office [phone] Secure chat preferred   Adrianne GORMAN Savers 09/04/2024, 1:04 PM

## 2024-09-04 NOTE — Progress Notes (Signed)
 NAME:  Katie Jacobson, MRN:  [id number], DOB:  [birth year], LOS: 9 ADMISSION DATE:  08/26/2024, CONSULTATION DATE:  08/26/24 REFERRING MD:  Daniel, CHIEF COMPLAINT:  s/p AVR MVR LAA clipping  History of Present Illness:  [name] 2/2 rheumatic valve dz, tobacco use, breast ca s/p R mastectomy and radiation, HTN, HLD  who presented to [facility] 08/26/24 for planned AVR MVR LAA clipping.  Admitted to ICU post op as per pre-op plan PCCM consulted in this setting  Xclamp 232 Total pump 177 EBL 1250 Product 1 Plt 746 cellsaver   Pertinent  Medical History  Rheumatic valve disease Severe AI Severe mitral regurg  HTN HLD Tobacco use  Breast ca s/p R mastectomy, radiation  Significant Hospital Events: Including procedures, antibiotic start and stop dates in addition to other pertinent events   08/26/24 AVR MVR LAA clipping  11/18 extubate 11/19 add milrinone , cont epi. Intractable Nausea and vomiting. Treated w/ Emend  after failing other antiemetics Adv HF called to consult. Recommended continuing epinephrine , and milrinone , added IV lasix  and gtt at 15mg /hr , started on amiodarone  for afib/flutter w/ RVR. Scr increased some. PICC placed but top angled up.  11/21 Weaned off high flow oxygen. Tolerating IV diuresis. Did not tolerate weaning off epi. Recurrent nausea/ vomiting.  11/23 ileus improved advancing diet  11/26: post-op day 8, still having lots of gas pains   Interim History / Subjective:  Overall doing okay post op. Still having lots of gas pain, very uncomfortable. Will keep mobilizing. She is having BM and eating. Will dc insulin  and glucose check per TCTS.   Objective    Blood pressure (!) 142/93, pulse 81, temperature 97.9 F (36.6 C), temperature source Oral, resp. rate (!) 22, height 5' 1 (1.549 m), weight 100.3 kg, last menstrual period 10/10/1996, SpO2 94%. CVP:  [11 mmHg-72 mmHg] 15 mmHg      Intake/Output Summary (Last 24 hours) at  09/04/2024 9070 Last data filed at 09/04/2024 0600 Gross per 24 hour  Intake 1031.68 ml  Output 3150 ml  Net -2118.32 ml   Filed Weights   09/02/24 0500 09/03/24 0500 09/04/24 0400  Weight: 103.2 kg 103.4 kg 100.3 kg    Examination: General: middle aged female, sitting oob in chair, uncomfortable complaining of abdominal pain  HEENT: [place]/AT, sclera anicteric, mucous membranes moist  Pulmonary: lungs clear, on nasal cannula, unlabored  Cardiac: irregular rate and rhythm, no m/r/g Abdomen: soft, non-tender, non-distended, hypoactive to absent bowel sounds  Extremities: still with 1-2+ pitting edema, warm Neuro: awake, alert, oriented, appropriate   Coox 60% CVP 8  Resolved problem list  Endotracheally intubated  Expected post-op thrombocytopenia  Assessment and Plan   H/o severe MR with mod stenosis and severe AI. S/p MVR, AVR and LAA clip on 11/17 LBBB  Acute HFrEF & cardiogenic shock> recovered EF on follow up TEE Atrial flutter, controlled rate -AHF following, appreciate management of inotropes  -milrinone  @ 0.375, epi @ 1 > ?dc epi -diuresis per AHF  -IV amiodarone  while on IV milrinone   -continue eliquis  BID -consider DCCV when closer to euvolemic  -continue to push mobility, pulmonary hygiene -continue ASA and statin  Acute hypoxic respiratory failure, initially secondary to pulmonary edema now likely due to atelectasis H/o tobacco abuse  Adequate gas exchange on 3L nasal cannula. -wean oxygen as able to maintain SpO2 92-94% -continue to push mobility, pulmonary hygiene  AKI, stable -renally dose meds, avoid nephrotoxic meds -strict  I/O -maintain adequate perfusion -diuresis per AHF -replete elytes  Constipation  Concern for recurrent ileus Having lots of gas pain this morning. Not passing flatulence. Last BM yesterday. Tried this morning with no luck - add simethicone  for gas pain  - increasing bowel regimen > add senna, miralax   - if not passing  flatus by early afternoon will obtain x-ray   Hyponatremia -avoid hypotonic fluids  Was having hyperglycemia earlier in course, dc cbg checks and insulin  per TCTS. Can spot check as needed   Expected ABLA; 1 unit pRBC Hgb remains stable at 8.1 today. No active signs of bleeding. -continue to monitor daily CBC  Hx breast ca s/p R mastectomy, radiation   Hx HTN & Hx HLD  -hold PTA enalapril , Toprol , and chlorthalidone    CC time: 74 The patient is critically ill with multiple organ system failure and requires high complexity decision making for assessment and support, frequent evaluation and titration of therapies, advanced monitoring, review of radiographic studies and interpretation of complex data.    Critical Care Time devoted to patient care services, exclusive of separately billable procedures, described in this note is 53  Tinnie FORBES Adolph DEVONNA [facility] 09/04/24 9:58 AM  Please see [email or web address] for pager details.  From 7A-7P if no response, please call [phone] After hours, please call ELink [phone]

## 2024-09-04 NOTE — Plan of Care (Signed)
  Problem: Education: Goal: Knowledge of General Education information will improve Description: Including pain rating scale, medication(s)/side effects and non-pharmacologic comfort measures Outcome: Progressing   Problem: Clinical Measurements: Goal: Ability to maintain clinical measurements within normal limits will improve Outcome: Progressing Goal: Will remain free from infection Outcome: Progressing Goal: Diagnostic test results will improve Outcome: Progressing Goal: Respiratory complications will improve Outcome: Progressing Goal: Cardiovascular complication will be avoided Outcome: Progressing   Problem: Nutrition: Goal: Adequate nutrition will be maintained Outcome: Not Progressing   Problem: Activity: Goal: Risk for activity intolerance will decrease Outcome: Not Progressing   Problem: Coping: Goal: Level of anxiety will decrease Outcome: Not Progressing   Problem: Elimination: Goal: Will not experience complications related to bowel motility Outcome: Progressing Goal: Will not experience complications related to urinary retention Outcome: Progressing   Problem: Respiratory: Goal: Ability to maintain a clear airway and adequate ventilation will improve Outcome: Progressing

## 2024-09-04 NOTE — Progress Notes (Signed)
 TCTS Evening Rounds:  Hemodynamically stable. Epi is off. Milrinone  0.375.  Diuresing well  Bowels working.

## 2024-09-04 NOTE — Progress Notes (Addendum)
 Patient ID: Katie Jacobson, female   DOB: April 20, 1956, 69 y.o.   MRN: [id number]     Advanced Heart Failure Rounding Note  Cardiologist: Alm Clay, MD  Chief Complaint: CHF Subjective:    POD# 9  Remains on NE 0.375 and epi 1. Co-ox 60%.   3.4L in UOP yesterday, net negative 2.1L. Wt down 6 lb. CVP 10-11.   Scr stable, 1.19 K 3.6 Mg 2.0   Post-op echo showed LV EF 65%, mild RV dilation with normal systolic function but there is likely severe tricuspid regurgitation, s/p bioprosthetic MV replacement with mean gradient 4, s/p bioprosthetic AV replacement with mean gradient 20.    Objective:   Weight Range: 100.3 kg Body mass index is 41.78 kg/m.   Vital Signs:   Temp:  [97.7 F (36.5 C)-98.2 F (36.8 C)] 97.9 F (36.6 C) (11/26 0300) Pulse Rate:  [70-87] 81 (11/26 0600) Resp:  [14-39] 22 (11/26 0600) BP: (84-153)/(54-117) 142/93 (11/26 0600) SpO2:  [86 %-100 %] 94 % (11/26 0600) Weight:  [100.3 kg] 100.3 kg (11/26 0400) Last BM Date : 09/03/24  Weight change: Filed Weights   09/02/24 0500 09/03/24 0500 09/04/24 0400  Weight: 103.2 kg 103.4 kg 100.3 kg    Intake/Output:   Intake/Output Summary (Last 24 hours) at 09/04/2024 0940 Last data filed at 09/04/2024 0600 Gross per 24 hour  Intake 1031.68 ml  Output 3150 ml  Net -2118.32 ml      Physical Exam   CVP 10-11  GENERAL: sitting up in chair. NAD Lungs- diminished at bases CARDIAC:  JVP: 10 cm         Irregular regular rhythm. No MRG, 1+ b/l LEE   ABDOMEN: Soft, non-tender, non-distended.  EXTREMITIES: Warm and well perfused.  NEUROLOGIC: No obvious FND   Telemetry   AFL 80s, Personally reviewed   Labs    CBC Recent Labs    09/03/24 0434 09/04/24 0350  WBC 7.8 7.9  HGB 8.1* 8.7*  HCT 23.6* 25.3*  MCV 92.2 91.3  PLT 154 196   Basic Metabolic Panel Recent Labs    88/74/74 0434 09/03/24 1655 09/04/24 0350  NA 131* 133* 133*  K 3.7 4.2 3.6  CL 90* 92* 92*  CO2 29 31 29    GLUCOSE 207* 147* 157*  BUN 16 15 15   CREATININE 1.04* 1.14* 1.19*  CALCIUM  8.1* 8.4* 8.1*  MG 1.9  --  2.0   Liver Function Tests No results for input(s): AST, ALT, ALKPHOS, BILITOT, PROT, ALBUMIN  in the last 72 hours. No results for input(s): LIPASE, AMYLASE in the last 72 hours. Cardiac Enzymes No results for input(s): CKTOTAL, CKMB, CKMBINDEX, TROPONINI in the last 72 hours.  BNP: BNP (last 3 results) No results for input(s): BNP in the last 8760 hours.  ProBNP (last 3 results) No results for input(s): PROBNP in the last 8760 hours.   D-Dimer No results for input(s): DDIMER in the last 72 hours. Hemoglobin A1C No results for input(s): HGBA1C in the last 72 hours. Fasting Lipid Panel No results for input(s): CHOL, HDL, LDLCALC, TRIG, CHOLHDL, LDLDIRECT in the last 72 hours. Thyroid  Function Tests No results for input(s): TSH, T4TOTAL, T3FREE, THYROIDAB in the last 72 hours.  Invalid input(s): FREET3  Other results:   Imaging    No results found.     Medications:     Scheduled Medications:  sodium chloride    Intravenous Once   apixaban   5 mg Oral BID   aspirin  EC  81 mg  Oral Daily   bisacodyl   10 mg Oral Daily   Or   bisacodyl   10 mg Rectal Daily   Chlorhexidine  Gluconate Cloth  6 each Topical Daily   feeding supplement  237 mL Oral BID BM   lidocaine   1 patch Transdermal Q24H   pantoprazole   40 mg Oral Daily   potassium chloride   40 mEq Oral Once   sodium chloride  flush  10-40 mL Intracatheter Q12H   sodium chloride  flush  3 mL Intravenous Q12H   spironolactone   12.5 mg Oral Daily    Infusions:  amiodarone  30 mg/hr (09/04/24 0600)   epinephrine  1 mcg/min (09/04/24 0600)   milrinone  0.375 mcg/kg/min (09/04/24 0600)   promethazine  (PHENERGAN ) injection (IM or IVPB) Stopped (08/31/24 1154)    PRN Medications: acetaminophen , albuterol , methocarbamol , ondansetron  (ZOFRAN ) IV, mouth rinse,  promethazine  (PHENERGAN ) injection (IM or IVPB), simethicone , sodium chloride  flush, sodium chloride  flush    Assessment/Plan   1. Rheumatic heart disease: Severe MR and severe AI on 9/25 TEE with EF 55-60%. S/p AVR with 21 mm Inspiris valve, MVR with 27 mm Mitris valve and LAA ligation by Dr. Militza Devery on 08/26/24. Post-op echo (11/20) showed LV EF 65%, mild RV dilation with normal systolic function but there is likely severe tricuspid regurgitation, s/p bioprosthetic MV replacement with mean gradient 4, s/p bioprosthetic AV replacement with mean gradient 20.   2. Post-op cardiogenic shock/RV failure:  Echo post-op on 11/20 showed LV EF 65%, mild RV dilation with normal systolic function but there is likely severe tricuspid regurgitation, s/p bioprosthetic MV replacement with mean gradient 4, s/p bioprosthetic AV replacement with mean gradient 20.  - Co-ox 60% on  milrinone  0.375 and epi 1 - Diuresing but remains volume overloaded, CVP 10-11 - continue IV Lasix  80 mg x 1 today w/ K supp  - place TED hoses  - continue Milrinone . Can stop Epi today - Increase Spiro to 25 mg daily  3. Atrial flutter: Atypical, noted post-op. She is currently in AFL, rate controlled  - Continue amiodarone  gtt.  - Continue Eliquis  5 mg bid  - Can consider DCCV later this wk. Will discuss TEE w/ CT surgery  4. AKI: improved, 1.49 => 1.4 -> 1.3  -> 1.5->1.25 ->1.04->1.1 today  5. Anemia: Post-op.  Transfuse hgb < 8.  8.7 today  6. Hypokalemia - K 3.6 - give K supp w/ diuresis  7. Post-op ileus - CCM advancing bowel regimen   CRITICAL CARE Performed by: Caffie Shed   Total critical care time: 15 minutes  Critical care time was exclusive of separately billable procedures and treating other patients.  Critical care was necessary to treat or prevent imminent or life-threatening deterioration.  Critical care was time spent personally by me on the following activities: development of treatment plan with  patient and/or surrogate as well as nursing, discussions with consultants, evaluation of patient's response to treatment, examination of patient, obtaining history from patient or surrogate, ordering and performing treatments and interventions, ordering and review of laboratory studies, ordering and review of radiographic studies, pulse oximetry and re-evaluation of patient's condition.    Length of Stay: [address] PA-C  09/04/2024, 9:40 AM  Advanced Heart Failure Team Pager [phone] (M-F; 7a - 5p)  Please contact [facility] for night-coverage after hours (5p -7a ) and weekends on [email or web address]  Agree with above.   Remains on low dose epi. Diuresed well with IV lasix .   CO-ox 60% CVP 10-11   Remains in AFL  on amio and Eliquis    General:  Sitting up in chair. No resp difficulty HEENT: normal Neck: supple. JVP to jaw Cor: Irregular rate & rhythm. No rubs, gallops or murmurs. Lungs: clear Abdomen: soft, nontender, nondistended.Good bowel sounds. Extremities: no cyanosis, clubbing, rash, 1+ edema Neuro: alert & orientedx3, cranial nerves grossly intact. moves all 4 extremities w/o difficulty. Affect pleasant  Remains mildly volume overloaded. Will give one more dose of IV lasix  today.   Can stop epi. Continue to mobilize. Adjust bowel regimen.   Plan DC-CV on Friday .  D/w Dr. Issiah Huffaker.   CRITICAL CARE Performed by: Cherrie Sieving  Total critical care time: 35 minutes  Critical care time was exclusive of separately billable procedures and treating other patients.  Critical care was necessary to treat or prevent imminent or life-threatening deterioration.  Critical care was time spent personally by me (independent of midlevel providers or residents) on the following activities: development of treatment plan with patient and/or surrogate as well as nursing, discussions with consultants, evaluation of patient's response to treatment, examination of patient, obtaining  history from patient or surrogate, ordering and performing treatments and interventions, ordering and review of laboratory studies, ordering and review of radiographic studies, pulse oximetry and re-evaluation of patient's condition.  Sieving Cherrie, MD  2:42 PM

## 2024-09-05 DIAGNOSIS — D649 Anemia, unspecified: Secondary | ICD-10-CM

## 2024-09-05 DIAGNOSIS — F17210 Nicotine dependence, cigarettes, uncomplicated: Secondary | ICD-10-CM | POA: Diagnosis not present

## 2024-09-05 DIAGNOSIS — J810 Acute pulmonary edema: Secondary | ICD-10-CM

## 2024-09-05 DIAGNOSIS — E871 Hypo-osmolality and hyponatremia: Secondary | ICD-10-CM

## 2024-09-05 DIAGNOSIS — E8770 Fluid overload, unspecified: Secondary | ICD-10-CM | POA: Diagnosis not present

## 2024-09-05 DIAGNOSIS — I351 Nonrheumatic aortic (valve) insufficiency: Secondary | ICD-10-CM | POA: Diagnosis not present

## 2024-09-05 DIAGNOSIS — N179 Acute kidney failure, unspecified: Secondary | ICD-10-CM | POA: Diagnosis not present

## 2024-09-05 DIAGNOSIS — Z952 Presence of prosthetic heart valve: Secondary | ICD-10-CM | POA: Diagnosis not present

## 2024-09-05 DIAGNOSIS — I5021 Acute systolic (congestive) heart failure: Secondary | ICD-10-CM | POA: Diagnosis not present

## 2024-09-05 DIAGNOSIS — I340 Nonrheumatic mitral (valve) insufficiency: Secondary | ICD-10-CM | POA: Diagnosis not present

## 2024-09-05 DIAGNOSIS — K5900 Constipation, unspecified: Secondary | ICD-10-CM

## 2024-09-05 LAB — BASIC METABOLIC PANEL WITH GFR
Anion gap: 13 (ref 5–15)
BUN: 15 mg/dL (ref 8–23)
CO2: 27 mmol/L (ref 22–32)
Calcium: 8.1 mg/dL — ABNORMAL LOW (ref 8.9–10.3)
Chloride: 92 mmol/L — ABNORMAL LOW (ref 98–111)
Creatinine, Ser: 1.16 mg/dL — ABNORMAL HIGH (ref 0.44–1.00)
GFR, Estimated: 52 mL/min — ABNORMAL LOW (ref >60)
Glucose, Bld: 155 mg/dL — ABNORMAL HIGH (ref 70–99)
Potassium: 3.9 mmol/L (ref 3.5–5.1)
Sodium: 132 mmol/L — ABNORMAL LOW (ref 135–145)

## 2024-09-05 LAB — COOXEMETRY PANEL
Carboxyhemoglobin: 2.1 % — ABNORMAL HIGH (ref 0.5–1.5)
Methemoglobin: <0.7 % (ref 0.0–1.5)
O2 Saturation: 58.3 %
Total hemoglobin: 9.3 g/dL — ABNORMAL LOW (ref 12.0–16.0)

## 2024-09-05 LAB — CBC
HCT: 26.3 % — ABNORMAL LOW (ref 36.0–46.0)
Hemoglobin: 8.8 g/dL — ABNORMAL LOW (ref 12.0–15.0)
MCH: 31.2 pg (ref 26.0–34.0)
MCHC: 33.5 g/dL (ref 30.0–36.0)
MCV: 93.3 fL (ref 80.0–100.0)
Platelets: 218 K/uL (ref 150–400)
RBC: 2.82 MIL/uL — ABNORMAL LOW (ref 3.87–5.11)
RDW: 17.4 % — ABNORMAL HIGH (ref 11.5–15.5)
WBC: 8.5 K/uL (ref 4.0–10.5)
nRBC: 0.2 % (ref 0.0–0.2)

## 2024-09-05 LAB — MAGNESIUM: Magnesium: 1.7 mg/dL (ref 1.7–2.4)

## 2024-09-05 MED ORDER — MAGNESIUM SULFATE 2 GM/50ML IV SOLN
2.0000 g | Freq: Once | INTRAVENOUS | Status: AC
  Administered 2024-09-05: 2 g via INTRAVENOUS
  Filled 2024-09-05: qty 50

## 2024-09-05 MED ORDER — POTASSIUM CHLORIDE CRYS ER 10 MEQ PO TBCR
20.0000 meq | EXTENDED_RELEASE_TABLET | Freq: Once | ORAL | Status: AC
  Administered 2024-09-05: 20 meq via ORAL
  Filled 2024-09-05: qty 2

## 2024-09-05 MED ORDER — TORSEMIDE 20 MG PO TABS
40.0000 mg | ORAL_TABLET | Freq: Once | ORAL | Status: AC
  Administered 2024-09-05: 40 mg via ORAL
  Filled 2024-09-05: qty 2

## 2024-09-05 MED ORDER — POTASSIUM CHLORIDE 20 MEQ PO PACK
40.0000 meq | PACK | Freq: Once | ORAL | Status: DC
  Filled 2024-09-05: qty 2

## 2024-09-05 NOTE — Progress Notes (Signed)
 10 Days Post-Op Procedure(s) (LRB): REPLACEMENT, AORTIC VALVE, OPEN, UTILIZING INSPIRIS RESILIA  AORTIC VALVE (N/A) REPLACEMENT, MITRAL VALVE, UTILIZING MITRIS RESILIA MITRAL VALVE (N/A) CLIPPING, LEFT ATRIAL APPENDAGE UTILIZING ATRICURE PRO 40 (N/A) ECHOCARDIOGRAM, TRANSESOPHAGEAL, INTRAOPERATIVE (N/A) Subjective: Tearful this morning, worried about cardioversion Hemodynamics remain stable Off oxygen and saturating well on room air Gas pain improved, still having BM  Objective: Vital signs in last 24 hours: BP 94/66   Pulse 75   Temp 97.8 F (36.6 C) (Oral)   Resp 16   Ht 5' 1 (1.549 m)   Wt 98.7 kg   LMP 10/10/1996   SpO2 96%   BMI 41.11 kg/m  Filed Weights   09/03/24 0500 09/04/24 0400 09/05/24 0500  Weight: 103.4 kg 100.3 kg 98.7 kg    Hemodynamic parameters for last 24 hours: CVP:  [5 mmHg-29 mmHg] 10 mmHg  Intake/Output from previous day: 11/26 0701 - 11/27 0700 In: 1100.3 [P.O.:120; I.V.:730.2; IV Piggyback:250.1] Out: 1750 [Urine:1750] Intake/Output this shift: No intake/output data recorded.  Physical Exam: General - Sitting in chair, appears comfortable CV - Aflutter in 80s Resp - Unlabored on RA Abd - Soft, no distention, non-tender to deep palpation Ext - Minimal edema  Lab Results:    Latest Ref Rng & Units 09/05/2024    4:59 AM 09/04/2024    3:50 AM 09/03/2024    4:34 AM  CBC  WBC 4.0 - 10.5 K/uL 8.5  7.9  7.8   Hemoglobin 12.0 - 15.0 g/dL 8.8  8.7  8.1   Hematocrit 36.0 - 46.0 % 26.3  25.3  23.6   Platelets 150 - 400 K/uL 218  196  154       Latest Ref Rng & Units 09/05/2024    4:59 AM 09/04/2024    3:50 AM 09/03/2024    4:55 PM  CMP  Glucose 70 - 99 mg/dL 844  842  852   BUN 8 - 23 mg/dL 15  15  15    Creatinine 0.44 - 1.00 mg/dL 8.83  8.80  8.85   Sodium 135 - 145 mmol/L 132  133  133   Potassium 3.5 - 5.1 mmol/L 3.9  3.6  4.2   Chloride 98 - 111 mmol/L 92  92  92   CO2 22 - 32 mmol/L 27  29  31    Calcium  8.9 - 10.3  mg/dL 8.1  8.1  8.4     CXR: No new imaging  Assessment/Plan: S/P Procedure(s) (LRB): REPLACEMENT, AORTIC VALVE, OPEN, UTILIZING INSPIRIS RESILIA  AORTIC VALVE (N/A) REPLACEMENT, MITRAL VALVE, UTILIZING MITRIS RESILIA MITRAL VALVE (N/A) CLIPPING, LEFT ATRIAL APPENDAGE UTILIZING ATRICURE PRO 40 (N/A) ECHOCARDIOGRAM, TRANSESOPHAGEAL, INTRAOPERATIVE (N/A) POD9 s/p bioAVR/MVR with LAAL  Wean milrinone  to 0.25 today Continue gentle diuresis per HF team if needed but appears fairly euvolemic Continue Eliquis  for A flutter, plan for cardioversion tomorrow Encourage ambulation, continue to encourage PO intake  Dispo: ICU   LOS: 10 days    Con RAMAN Angell Honse 09/05/2024

## 2024-09-05 NOTE — Progress Notes (Signed)
 Patient ID: Katie Jacobson, female   DOB: Apr 25, 1956, 68 y.o.   MRN: [id number]     Advanced Heart Failure Rounding Note  Cardiologist: Alm Clay, MD  Chief Complaint: CHF Subjective:    POD# 10  Remains on NE 0.375. Off epi. Co-ox 58% CVP 12  Out 1.7L overnight  Scr improved Weight down another 4 pounds  Remains in AFL with CVR.   Appetite improved   Nervous about DC-CV  Post-op echo showed LV EF 65%, mild RV dilation with normal systolic function but there is likely severe tricuspid regurgitation, s/p bioprosthetic MV replacement with mean gradient 4, s/p bioprosthetic AV replacement with mean gradient 20.    Objective:   Weight Range: 98.7 kg Body mass index is 41.11 kg/m.   Vital Signs:   Temp:  [97.8 F (36.6 C)-98.1 F (36.7 C)] 97.8 F (36.6 C) (11/27 0240) Pulse Rate:  [66-90] 75 (11/27 1115) Resp:  [14-30] 20 (11/27 1115) BP: (94-140)/(47-98) 101/71 (11/27 1115) SpO2:  [88 %-98 %] 88 % (11/27 1115) Weight:  [98.7 kg] 98.7 kg (11/27 0500) Last BM Date : 09/05/24  Weight change: Filed Weights   09/03/24 0500 09/04/24 0400 09/05/24 0500  Weight: 103.4 kg 100.3 kg 98.7 kg    Intake/Output:   Intake/Output Summary (Last 24 hours) at 09/05/2024 1318 Last data filed at 09/05/2024 1100 Gross per 24 hour  Intake 1309.4 ml  Output 1200 ml  Net 109.4 ml      Physical Exam   General:  Sitting up in bed. No resp difficulty HEENT: normal Neck: supple. jaw Cor: Irregular rate & rhythm. No rubs, gallops or murmurs. Lungs: clear Abdomen: soft, nontender, nondistended.Good bowel sounds. Extremities: no cyanosis, clubbing, rash, 1+ edema Neuro: alert & orientedx3, cranial nerves grossly intact. moves all 4 extremities w/o difficulty. Affect pleasant    Telemetry   AFL 70-80s, Personally reviewed   Labs    CBC Recent Labs    09/04/24 0350 09/05/24 0459  WBC 7.9 8.5  HGB 8.7* 8.8*  HCT 25.3* 26.3*  MCV 91.3 93.3  PLT 196 218   Basic  Metabolic Panel Recent Labs    88/73/74 0350 09/05/24 0459  NA 133* 132*  K 3.6 3.9  CL 92* 92*  CO2 29 27  GLUCOSE 157* 155*  BUN 15 15  CREATININE 1.19* 1.16*  CALCIUM  8.1* 8.1*  MG 2.0 1.7   Liver Function Tests No results for input(s): AST, ALT, ALKPHOS, BILITOT, PROT, ALBUMIN  in the last 72 hours. No results for input(s): LIPASE, AMYLASE in the last 72 hours. Cardiac Enzymes No results for input(s): CKTOTAL, CKMB, CKMBINDEX, TROPONINI in the last 72 hours.  BNP: BNP (last 3 results) No results for input(s): BNP in the last 8760 hours.  ProBNP (last 3 results) No results for input(s): PROBNP in the last 8760 hours.   D-Dimer No results for input(s): DDIMER in the last 72 hours. Hemoglobin A1C No results for input(s): HGBA1C in the last 72 hours. Fasting Lipid Panel No results for input(s): CHOL, HDL, LDLCALC, TRIG, CHOLHDL, LDLDIRECT in the last 72 hours. Thyroid  Function Tests No results for input(s): TSH, T4TOTAL, T3FREE, THYROIDAB in the last 72 hours.  Invalid input(s): FREET3  Other results:   Imaging    No results found.     Medications:     Scheduled Medications:  apixaban   5 mg Oral BID   aspirin  EC  81 mg Oral Daily   bisacodyl   10 mg Oral Daily   Or  bisacodyl   10 mg Rectal Daily   Chlorhexidine  Gluconate Cloth  6 each Topical Daily   feeding supplement  237 mL Oral BID BM   lidocaine   1 patch Transdermal Q24H   pantoprazole   40 mg Oral Daily   polyethylene glycol  17 g Oral Daily   senna  1 tablet Oral Daily   sodium chloride  flush  10-40 mL Intracatheter Q12H   sodium chloride  flush  3 mL Intravenous Q12H   spironolactone   25 mg Oral Daily    Infusions:  amiodarone  30 mg/hr (09/05/24 1100)   milrinone  0.25 mcg/kg/min (09/05/24 1240)   promethazine  (PHENERGAN ) injection (IM or IVPB) Stopped (08/31/24 1154)    PRN Medications: acetaminophen , albuterol , HYDROmorphone   (DILAUDID ) injection, hydrOXYzine , methocarbamol , ondansetron  (ZOFRAN ) IV, mouth rinse, oxyCODONE , polyethylene glycol, promethazine  (PHENERGAN ) injection (IM or IVPB), simethicone , sodium chloride  flush, sodium chloride  flush    Assessment/Plan   1. Rheumatic heart disease: Severe MR and severe AI on 9/25 TEE with EF 55-60%. S/p AVR with 21 mm Inspiris valve, MVR with 27 mm Mitris valve and LAA ligation by Dr. Davan Nawabi on 08/26/24. Post-op echo (11/20) showed LV EF 65%, mild RV dilation with normal systolic function but there is likely severe tricuspid regurgitation, s/p bioprosthetic MV replacement with mean gradient 4, s/p bioprosthetic AV replacement with mean gradient 20.   2. Post-op cardiogenic shock/RV failure:  Echo post-op on 11/20 showed LV EF 65%, mild RV dilation with normal systolic function but there is likely severe tricuspid regurgitation, s/p bioprosthetic MV replacement with mean gradient 4, s/p bioprosthetic AV replacement with mean gradient 20.  - Co-ox 58% on  milrinone  0.375. Off epi - Still mildly volume overloaded, CVP 12-13 - She prefers to skip IV lasix  today. Give torsemide  40 x1 po - Decrease milrinone  to 0.25. Follow co-ox - Continue Spiro 25 mg daily  3. Atrial flutter: Atypical, noted post-op. She is currently in AFL, rate controlled  - Continue amiodarone  gtt.  - Continue Eliquis  5 mg bid  - DC-CV tomorrow at bedside. I reassured her 4. AKI: resolved. SCr 1.1 5. Anemia: Post-op.  Transfuse hgb < 8.  8.8 today  6. Hypokalemia - K 3.9 - Continue spiro. Supp K as needed 7. Post-op ileus - Resolving    Length of Stay: 10  Toribio Fuel, MD  09/05/2024, 1:18 PM  Advanced Heart Failure Team Pager [phone] (M-F; 7a - 5p)  Please contact [facility] for night-coverage after hours (5p -7a ) and weekends on [email or web address]

## 2024-09-05 NOTE — Progress Notes (Addendum)
   NAME:  Katie Jacobson, MRN:  [id number], DOB:  January 25, 1956, LOS: 10 ADMISSION DATE:  08/26/2024, CONSULTATION DATE:  08/26/24 REFERRING MD:  Daniel, CHIEF COMPLAINT:  s/p AVR MVR LAA clipping  History of Present Illness:  [name] 2/2 rheumatic valve dz, tobacco use, breast ca s/p R mastectomy and radiation, HTN, HLD  who presented to [facility] 08/26/24 for planned AVR MVR LAA clipping.  Admitted to ICU post op as per pre-op plan PCCM consulted in this setting  Xclamp 232 Total pump 177 EBL 1250 Product 1 Plt 746 cellsaver   Pertinent  Medical History  Rheumatic valve disease Severe AI Severe mitral regurg  HTN HLD Tobacco use  Breast ca s/p R mastectomy, radiation  Significant Hospital Events: Including procedures, antibiotic start and stop dates in addition to other pertinent events   08/26/24 AVR MVR LAA clipping  11/18 extubate 11/19 add milrinone , cont epi. Intractable Nausea and vomiting. Treated w/ Emend  after failing other antiemetics Adv HF called to consult. Recommended continuing epinephrine , and milrinone , added IV lasix  and gtt at 15mg /hr , started on amiodarone  for afib/flutter w/ RVR. Scr increased some. PICC placed but top angled up.  11/21 Weaned off high flow oxygen. Tolerating IV diuresis. Did not tolerate weaning off epi. Recurrent nausea/ vomiting.  11/23 ileus improved advancing diet  11/26: post-op day 8, still having lots of gas pains + BM   Interim History / Subjective:  Coox 58 cvp 6on 0.375 milrinone  Cr stable 1.16  Mag 1.7 Hgb 8.8   Objective    Blood pressure 94/66, pulse 75, temperature 97.8 F (36.6 C), temperature source Oral, resp. rate 16, height 5' 1 (1.549 m), weight 98.7 kg, last menstrual period 10/10/1996, SpO2 96%. CVP:  [5 mmHg-29 mmHg] 10 mmHg      Intake/Output Summary (Last 24 hours) at 09/05/2024 0906 Last data filed at 09/05/2024 0700 Gross per 24 hour  Intake 1100.28 ml  Output 1750 ml  Net  -649.72 ml   Filed Weights   09/03/24 0500 09/04/24 0400 09/05/24 0500  Weight: 103.4 kg 100.3 kg 98.7 kg    Examination: General: chronically ill middle aged adult F NAD in recliner  Neuro: AAOx4  HEENT: NCAT membranes moist  Pulmonary: anteriorly CTAb, but diminished at bases  Cardiac: irregular, cap refill < 3 sec  Abdomen: soft ndnt GU: no foley  Extremities: no acute joint deformity, no cyanosis    Resolved problem list  Endotracheally intubated  Expected post-op thrombocytopenia Hypoxic resp failure   Assessment and Plan   S/p MVR AVR LAA clipping LBBB Aflutter  RV failure P - HF following appreciate recs  - cont milrinone  0.375, spiro -- hopefully able to wean milrinone   -follow coox   - amio  - DCCV Friday - eliquis   - ASA, statin  -optimize lytes   Pulm edema, atelectasis  Mixed restrictive/obstructive lung dz (on recent PFTs) Tobacco use disorder Hx breast ca s/p R mastectomy, radiation  P -cont pulm hygiene, mobility -weaned off O2 -- goal > 92  -PRN bronchodilator   AKI, stable  HypoNa P -follow renal indices uop  -optimize lytes   Constipation, improving  P -cont bowel reg  Anemia -follow CBC  Hx HTN & Hx HLD   Hx HTN Hx HLD -holding home enalapril  toprol  chlorthalidone      High mdm  Ronnald Gave MSN, AGACNP-BC [place] [facility] Amion for pager 09/05/2024, 9:07 AM

## 2024-09-06 DIAGNOSIS — I5021 Acute systolic (congestive) heart failure: Secondary | ICD-10-CM | POA: Diagnosis not present

## 2024-09-06 DIAGNOSIS — I351 Nonrheumatic aortic (valve) insufficiency: Secondary | ICD-10-CM | POA: Diagnosis not present

## 2024-09-06 DIAGNOSIS — F17210 Nicotine dependence, cigarettes, uncomplicated: Secondary | ICD-10-CM | POA: Diagnosis not present

## 2024-09-06 DIAGNOSIS — Z952 Presence of prosthetic heart valve: Secondary | ICD-10-CM | POA: Diagnosis not present

## 2024-09-06 DIAGNOSIS — I519 Heart disease, unspecified: Secondary | ICD-10-CM | POA: Diagnosis not present

## 2024-09-06 DIAGNOSIS — Z9889 Other specified postprocedural states: Secondary | ICD-10-CM | POA: Diagnosis not present

## 2024-09-06 DIAGNOSIS — N179 Acute kidney failure, unspecified: Secondary | ICD-10-CM | POA: Diagnosis not present

## 2024-09-06 DIAGNOSIS — E8770 Fluid overload, unspecified: Secondary | ICD-10-CM | POA: Diagnosis not present

## 2024-09-06 LAB — BASIC METABOLIC PANEL WITH GFR
Anion gap: 11 (ref 5–15)
BUN: 12 mg/dL (ref 8–23)
CO2: 27 mmol/L (ref 22–32)
Calcium: 8 mg/dL — ABNORMAL LOW (ref 8.9–10.3)
Chloride: 94 mmol/L — ABNORMAL LOW (ref 98–111)
Creatinine, Ser: 1.17 mg/dL — ABNORMAL HIGH (ref 0.44–1.00)
GFR, Estimated: 52 mL/min — ABNORMAL LOW (ref >60)
Glucose, Bld: 141 mg/dL — ABNORMAL HIGH (ref 70–99)
Potassium: 3.8 mmol/L (ref 3.5–5.1)
Sodium: 132 mmol/L — ABNORMAL LOW (ref 135–145)

## 2024-09-06 LAB — CBC
HCT: 25.4 % — ABNORMAL LOW (ref 36.0–46.0)
Hemoglobin: 8.5 g/dL — ABNORMAL LOW (ref 12.0–15.0)
MCH: 31 pg (ref 26.0–34.0)
MCHC: 33.5 g/dL (ref 30.0–36.0)
MCV: 92.7 fL (ref 80.0–100.0)
Platelets: 209 K/uL (ref 150–400)
RBC: 2.74 MIL/uL — ABNORMAL LOW (ref 3.87–5.11)
RDW: 17.5 % — ABNORMAL HIGH (ref 11.5–15.5)
WBC: 7.2 K/uL (ref 4.0–10.5)
nRBC: 0 % (ref 0.0–0.2)

## 2024-09-06 LAB — COOXEMETRY PANEL
Carboxyhemoglobin: 1.3 % (ref 0.5–1.5)
Methemoglobin: 1.8 % — ABNORMAL HIGH (ref 0.0–1.5)
O2 Saturation: 60.4 %
Total hemoglobin: 7.4 g/dL — ABNORMAL LOW (ref 12.0–16.0)

## 2024-09-06 LAB — MAGNESIUM: Magnesium: 2 mg/dL (ref 1.7–2.4)

## 2024-09-06 MED ORDER — MILRINONE LACTATE IN DEXTROSE 20-5 MG/100ML-% IV SOLN
0.1250 ug/kg/min | INTRAVENOUS | Status: DC
  Administered 2024-09-06: 0.125 ug/kg/min via INTRAVENOUS
  Filled 2024-09-06: qty 100

## 2024-09-06 MED ORDER — MIDAZOLAM HCL (PF) 5 MG/ML IJ SOLN: 2.0000 mg | Freq: Once | INTRAMUSCULAR | Status: DC | PRN

## 2024-09-06 MED ORDER — MILRINONE LACTATE IN DEXTROSE 20-5 MG/100ML-% IV SOLN: 0.1250 ug/kg/min | INTRAVENOUS | Status: DC

## 2024-09-06 MED ORDER — FUROSEMIDE 10 MG/ML IJ SOLN
80.0000 mg | Freq: Once | INTRAMUSCULAR | Status: AC
  Administered 2024-09-06: 80 mg via INTRAVENOUS
  Filled 2024-09-06: qty 8

## 2024-09-06 MED ORDER — POTASSIUM CHLORIDE CRYS ER 10 MEQ PO TBCR
40.0000 meq | EXTENDED_RELEASE_TABLET | Freq: Once | ORAL | Status: AC
  Administered 2024-09-06: 40 meq via ORAL
  Filled 2024-09-06: qty 4

## 2024-09-06 MED ORDER — FENTANYL CITRATE (PF) 50 MCG/ML IJ SOSY
50.0000 ug | PREFILLED_SYRINGE | Freq: Once | INTRAMUSCULAR | Status: AC | PRN
  Administered 2024-09-06: 50 ug via INTRAVENOUS
  Filled 2024-09-06: qty 2

## 2024-09-06 MED ORDER — KETAMINE HCL 50 MG/5ML IJ SOSY
50.0000 mg | PREFILLED_SYRINGE | Freq: Once | INTRAMUSCULAR | Status: AC | PRN
  Administered 2024-09-06: 50 mg via INTRAVENOUS
  Filled 2024-09-06: qty 5

## 2024-09-06 MED ORDER — MIDAZOLAM HCL (PF) 2 MG/2ML IJ SOLN
2.0000 mg | Freq: Once | INTRAMUSCULAR | Status: AC | PRN
  Administered 2024-09-06: 2 mg via INTRAVENOUS
  Filled 2024-09-06: qty 4

## 2024-09-06 NOTE — Procedures (Signed)
 09/06/2024 Katie Jacobson   Procedure: Moderate Sedation (CPT [phone]) for cardioversion  The patient received ketamine 50mg , versed  2mg , fentanyl  50 mcg and dosages were recorded on the EMR until appropriate level of moderate sedation reached. Monitoring performed as planned in the pre-sedation evaluation.  The patient was recovered from the sedation without complication or incident. Patient returned to pre-sedation level of awareness. The monitoring was discontinued at this time.  Post-anesthesia evaluation:  Consciousness and pulmonary status returned to pre-anesthetic state.  [name], MD

## 2024-09-06 NOTE — Progress Notes (Signed)
 11 Days Post-Op Procedure(s) (LRB): REPLACEMENT, AORTIC VALVE, OPEN, UTILIZING INSPIRIS RESILIA  AORTIC VALVE (N/A) REPLACEMENT, MITRAL VALVE, UTILIZING MITRIS RESILIA MITRAL VALVE (N/A) CLIPPING, LEFT ATRIAL APPENDAGE UTILIZING ATRICURE PRO 40 (N/A) ECHOCARDIOGRAM, TRANSESOPHAGEAL, INTRAOPERATIVE (N/A) Subjective: Doing well this morning, had a large BM Still having back pain but appears more comfortable Continues to be stable on RA Co-ox 60 this morning Still in Aflutter 80s  Objective: Vital signs in last 24 hours: BP 127/82   Pulse 83   Temp 98.5 F (36.9 C) (Oral)   Resp (!) 24   Ht 5' 1 (1.549 m)   Wt 99.6 kg   LMP 10/10/1996   SpO2 98%   BMI 41.49 kg/m  Filed Weights   09/04/24 0400 09/05/24 0500 09/06/24 0440  Weight: 100.3 kg 98.7 kg 99.6 kg    Hemodynamic parameters for last 24 hours: CVP:  [6 mmHg-28 mmHg] 15 mmHg  Intake/Output from previous day: 11/27 0701 - 11/28 0700 In: 971 [I.V.:921; IV Piggyback:50] Out: 800 [Urine:800] Intake/Output this shift: No intake/output data recorded.  Physical Exam: General - Sitting in bed, appears comfortable CV - Aflutter in 80s Resp - Unlabored on RA Abd - Soft, decreased distention, non-tender to deep palpation Ext - Minimal edema  Lab Results:    Latest Ref Rng & Units 09/06/2024    4:29 AM 09/05/2024    4:59 AM 09/04/2024    3:50 AM  CBC  WBC 4.0 - 10.5 K/uL 7.2  8.5  7.9   Hemoglobin 12.0 - 15.0 g/dL 8.5  8.8  8.7   Hematocrit 36.0 - 46.0 % 25.4  26.3  25.3   Platelets 150 - 400 K/uL 209  218  196       Latest Ref Rng & Units 09/06/2024    4:29 AM 09/05/2024    4:59 AM 09/04/2024    3:50 AM  CMP  Glucose 70 - 99 mg/dL 858  844  842   BUN 8 - 23 mg/dL 12  15  15    Creatinine 0.44 - 1.00 mg/dL 8.82  8.83  8.80   Sodium 135 - 145 mmol/L 132  132  133   Potassium 3.5 - 5.1 mmol/L 3.8  3.9  3.6   Chloride 98 - 111 mmol/L 94  92  92   CO2 22 - 32 mmol/L 27  27  29    Calcium  8.9 - 10.3  mg/dL 8.0  8.1  8.1     CXR: No new imaging  Assessment/Plan: S/P Procedure(s) (LRB): REPLACEMENT, AORTIC VALVE, OPEN, UTILIZING INSPIRIS RESILIA  AORTIC VALVE (N/A) REPLACEMENT, MITRAL VALVE, UTILIZING MITRIS RESILIA MITRAL VALVE (N/A) CLIPPING, LEFT ATRIAL APPENDAGE UTILIZING ATRICURE PRO 40 (N/A) ECHOCARDIOGRAM, TRANSESOPHAGEAL, INTRAOPERATIVE (N/A) POD11 s/p bioAVR/MVR with LAAL  Wean milrinone  to 0.125 today Continue gentle diuresis per HF team if needed but appears fairly euvolemic Continue Eliquis  for A flutter, plan for cardioversion today Encourage ambulation, continue to encourage PO intake  Dispo: ICU   LOS: 11 days    Con RAMAN Noami Bove 09/06/2024

## 2024-09-06 NOTE — Progress Notes (Signed)
 Physical Therapy Treatment Patient Details Name: Katie Jacobson MRN: [id number] DOB: [birth year] Today's Date: 09/06/2024   History of Present Illness Pt is 68 yo presenting to [facility] on 11/17 for scheduled AVR/MVR. PMH: breast cancer (triple negative), rheumatic valve disease, severe AI, sever mitral regurgitation, HTN, HLD    PT Comments  Making excellent progress. Able to transfer with CGA and ambulate up to 450 feet with rollator at a supervision level today. Requires several standing rest breaks to complete but developing good awareness and we reviewed energy conservation techniques. No overt LOB with light use of RW for support, intermittent cues for upright stance and proximity to device. Feel that she is progressing rapidly now and anticipate continued improvement with independent mobility, but if there are any set-backs may need to re-consider post acute rehab. Will update as appropriate. Patient will continue to benefit from skilled physical therapy services to further improve independence with functional mobility.    If plan is discharge home, recommend the following: Assist for transportation;Help with stairs or ramp for entrance;Assistance with cooking/housework;A little help with walking and/or transfers;A little help with bathing/dressing/bathroom   Can travel by private vehicle        Equipment Recommendations  Rolling walker (2 wheels);BSC/3in1 (May progress to rollator)    Recommendations for Other Services       Precautions / Restrictions Precautions Precautions: Sternal Precaution Booklet Issued: No Recall of Precautions/Restrictions: Intact Restrictions Weight Bearing Restrictions Per Provider Order: Yes RUE Weight Bearing Per Provider Order: Touch down weight bearing LUE Weight Bearing Per Provider Order: Touch down weight bearing Other Position/Activity Restrictions: sternal precautions     Mobility  Bed Mobility               General bed mobility  comments: in recliner    Transfers Overall transfer level: Needs assistance Equipment used: Rolling walker (2 wheels) Transfers: Sit to/from Stand Sit to Stand: Contact guard assist           General transfer comment: CGA for safety, cues for technique. Good recall with hand placement across chest to maintain precautions from recliner and onto [facility].    Ambulation/Gait Ambulation/Gait assistance: Supervision Gait Distance (Feet): 450 Feet Assistive device: Rolling walker (2 wheels) Gait Pattern/deviations: Step-through pattern, Decreased stride length Gait velocity: decreased Gait velocity interpretation: <1.31 ft/sec, indicative of household ambulator   General Gait Details: Cues for upright stance, symptom awareness, and breathing techniques. Required 4 standing rest breaks, each <1 minute. No overt LOB noted. Good RW control with min cues for proximity during turns. SpO2 98% on RA.   Stairs             Wheelchair Mobility     Tilt Bed    Modified Rankin (Stroke Patients Only)       Balance Overall balance assessment: Needs assistance Sitting-balance support: Feet supported, No upper extremity supported Sitting balance-Leahy Scale: Fair Sitting balance - Comments: seated EOB   Standing balance support: No upper extremity supported Standing balance-Leahy Scale: Fair Standing balance comment: stands statically without UE support.                            Communication Communication Communication: No apparent difficulties  Cognition Arousal: Alert Behavior During Therapy: WFL for tasks assessed/performed   PT - Cognitive impairments: No apparent impairments  Following commands: Intact      Cueing Cueing Techniques: Verbal cues  Exercises      General Comments General comments (skin integrity, edema, etc.): VSS throughout on RA, a-flutter      Pertinent Vitals/Pain Pain Assessment Pain Assessment:  Faces Faces Pain Scale: Hurts even more Pain Location: back Pain Descriptors / Indicators: Aching, Discomfort    Home Living                          Prior Function            PT Goals (current goals can now be found in the care plan section) Acute Rehab PT Goals Patient Stated Goal: to improve mobility/decrease pain PT Goal Formulation: With patient Time For Goal Achievement: 09/11/24 Potential to Achieve Goals: Good Progress towards PT goals: Progressing toward goals    Frequency    Min 2X/week      PT Plan      Co-evaluation              AM-PAC PT 6 Clicks Mobility   Outcome Measure  Help needed turning from your back to your side while in a flat bed without using bedrails?: A Little Help needed moving from lying on your back to sitting on the side of a flat bed without using bedrails?: A Little Help needed moving to and from a bed to a chair (including a wheelchair)?: A Little Help needed standing up from a chair using your arms (e.g., wheelchair or bedside chair)?: A Little Help needed to walk in hospital room?: A Little Help needed climbing 3-5 steps with a railing? : A Lot 6 Click Score: 17    End of Session Equipment Utilized During Treatment: Gait belt Activity Tolerance: Patient tolerated treatment well Patient left: with call bell/phone within reach [facility] with call bell, RN outside of room aware.) Nurse Communication: Mobility status PT Visit Diagnosis: Unsteadiness on feet (R26.81);Other abnormalities of gait and mobility (R26.89);Muscle weakness (generalized) (M62.81);Pain;Difficulty in walking, not elsewhere classified (R26.2) Pain - part of body:  (back)     Time: 8567-8542 PT Time Calculation (min) (ACUTE ONLY): 25 min  Charges:    $Gait Training: 8-22 mins $Therapeutic Activity: 8-22 mins PT General Charges $$ ACUTE PT VISIT: 1 Visit                     Leontine Roads, PT, DPT [facility]  Rehabilitation  Services Physical Therapist Office: [phone] Website: [email or web address]    Leontine GORMAN Roads 09/06/2024, 5:22 PM

## 2024-09-06 NOTE — Progress Notes (Signed)
 09/06/2024 Critical Care Medicine Pre Moderate Sedation Evaluation  Katie Jacobson  Procedure needing sedation: Cardioversion  Relevant medical and surgical history reviewed Current meds reviewed  Exam ENT: malampatti 1, trachea midline LUNGS: nonlabored, clear HEART: tachycardic, irregular  MODERATE SEDATION MONITORING Bedside direct observation by [facility] provider Telemetry Pulse oximetry Q107min BP checks  [name], MD [facility] Pulmonary Critical Care Medicine

## 2024-09-06 NOTE — CV Procedure (Signed)
    DIRECT CURRENT CARDIOVERSION  NAME:  Katie Jacobson   MRN: [id number] DOB:  Jan 17, 1956   ADMIT DATE: 08/26/2024   INDICATIONS: Atrial flutter   PROCEDURE:   Informed consent was obtained prior to the procedure. The risks, benefits and alternatives for the procedure were discussed and the patient comprehended these risks. Once an appropriate time out was taken, the patient had the defibrillator pads placed in the anterior and posterior position. The patient then underwent sedation by the CCM service. Once an appropriate level of sedation was achieved, the patient received a single biphasic, synchronized 200J shock with prompt conversion to sinus rhythm. No apparent complications.  Toribio Fuel, MD  9:50 AM

## 2024-09-06 NOTE — Progress Notes (Signed)
Inpatient Rehab Admissions Coordinator:  Pt not medically ready for CIR admission. Will continue to follow.   Wolfgang Phoenix, MS, CCC-SLP Admissions Coordinator [phone]

## 2024-09-06 NOTE — Progress Notes (Signed)
 Patient ID: Katie Jacobson, female   DOB: 27-Sep-1956, 68 y.o.   MRN: [id number]     Advanced Heart Failure Rounding Note  Cardiologist: Alm Clay, MD  Chief Complaint: CHF Subjective:    POD# 11  Remains on milrinone  0.25  NE off  Co-ox 60%  Minimal response to oral torsemide  Weight up 2 pounds. CVP 15  Remains in AFL with CVR.   Had good BM   Post-op echo showed LV EF 65%, mild RV dilation with normal systolic function but there is likely severe tricuspid regurgitation, s/p bioprosthetic MV replacement with mean gradient 4, s/p bioprosthetic AV replacement with mean gradient 20.    Objective:   Weight Range: 99.6 kg Body mass index is 41.49 kg/m.   Vital Signs:   Temp:  [98.4 F (36.9 C)-98.5 F (36.9 C)] 98.5 F (36.9 C) (11/28 0300) Pulse Rate:  [64-93] 83 (11/28 0700) Resp:  [15-35] 24 (11/28 0700) BP: (83-143)/(58-96) 127/82 (11/28 0700) SpO2:  [75 %-98 %] 98 % (11/28 0700) Weight:  [99.6 kg] 99.6 kg (11/28 0440) Last BM Date : 09/05/24  Weight change: Filed Weights   09/04/24 0400 09/05/24 0500 09/06/24 0440  Weight: 100.3 kg 98.7 kg 99.6 kg    Intake/Output:   Intake/Output Summary (Last 24 hours) at 09/06/2024 0918 Last data filed at 09/06/2024 0700 Gross per 24 hour  Intake 971.03 ml  Output 800 ml  Net 171.03 ml      Physical Exam   General:  Sitting up in bed. No resp difficulty HEENT: normal Neck: supple JVP to jaw  Cor: Regular rate & rhythm. No rubs, gallops or murmurs. Lungs: clear Abdomen: soft, nontender, nondistended.Good bowel sounds. Extremities: no cyanosis, clubbing, rash, tr-1+ edema Neuro: alert & orientedx3, cranial nerves grossly intact. moves all 4 extremities w/o difficulty. Affect pleasant  Telemetry   AFL 70-80s Personally reviewed   Labs    CBC Recent Labs    09/05/24 0459 09/06/24 0429  WBC 8.5 7.2  HGB 8.8* 8.5*  HCT 26.3* 25.4*  MCV 93.3 92.7  PLT 218 209   Basic Metabolic Panel Recent Labs     09/05/24 0459 09/06/24 0429  NA 132* 132*  K 3.9 3.8  CL 92* 94*  CO2 27 27  GLUCOSE 155* 141*  BUN 15 12  CREATININE 1.16* 1.17*  CALCIUM  8.1* 8.0*  MG 1.7 2.0   Liver Function Tests No results for input(s): AST, ALT, ALKPHOS, BILITOT, PROT, ALBUMIN  in the last 72 hours. No results for input(s): LIPASE, AMYLASE in the last 72 hours. Cardiac Enzymes No results for input(s): CKTOTAL, CKMB, CKMBINDEX, TROPONINI in the last 72 hours.  BNP: BNP (last 3 results) No results for input(s): BNP in the last 8760 hours.  ProBNP (last 3 results) No results for input(s): PROBNP in the last 8760 hours.   D-Dimer No results for input(s): DDIMER in the last 72 hours. Hemoglobin A1C No results for input(s): HGBA1C in the last 72 hours. Fasting Lipid Panel No results for input(s): CHOL, HDL, LDLCALC, TRIG, CHOLHDL, LDLDIRECT in the last 72 hours. Thyroid  Function Tests No results for input(s): TSH, T4TOTAL, T3FREE, THYROIDAB in the last 72 hours.  Invalid input(s): FREET3  Other results:   Imaging    No results found.     Medications:     Scheduled Medications:  apixaban   5 mg Oral BID   aspirin  EC  81 mg Oral Daily   bisacodyl   10 mg Oral Daily   Or  bisacodyl   10 mg Rectal Daily   Chlorhexidine  Gluconate Cloth  6 each Topical Daily   feeding supplement  237 mL Oral BID BM   lidocaine   1 patch Transdermal Q24H   pantoprazole   40 mg Oral Daily   polyethylene glycol  17 g Oral Daily   potassium chloride   40 mEq Oral Once   senna  1 tablet Oral Daily   sodium chloride  flush  10-40 mL Intracatheter Q12H   sodium chloride  flush  3 mL Intravenous Q12H   spironolactone   25 mg Oral Daily    Infusions:  amiodarone  30 mg/hr (09/06/24 0705)   milrinone  0.25 mcg/kg/min (09/06/24 0700)   promethazine  (PHENERGAN ) injection (IM or IVPB) Stopped (08/31/24 1154)    PRN Medications: acetaminophen , albuterol , fentaNYL   (SUBLIMAZE ) injection, HYDROmorphone  (DILAUDID ) injection, hydrOXYzine , ketamine (KETALAR) injection 10mg /mL (IV use), methocarbamol , midazolam  PF, ondansetron  (ZOFRAN ) IV, mouth rinse, oxyCODONE , polyethylene glycol, promethazine  (PHENERGAN ) injection (IM or IVPB), simethicone , sodium chloride  flush, sodium chloride  flush    Assessment/Plan   1. Rheumatic heart disease: Severe MR and severe AI on 9/25 TEE with EF 55-60%. S/p AVR with 21 mm Inspiris valve, MVR with 27 mm Mitris valve and LAA ligation by Dr. Emil Klassen on 08/26/24. Post-op echo (11/20) showed LV EF 65%, mild RV dilation with normal systolic function but there is likely severe tricuspid regurgitation, s/p bioprosthetic MV replacement with mean gradient 4, s/p bioprosthetic AV replacement with mean gradient 20.   2. Post-op cardiogenic shock/RV failure:  Echo post-op on 11/20 showed LV EF 65%, mild RV dilation with normal systolic function but there is likely severe tricuspid regurgitation, s/p bioprosthetic MV replacement with mean gradient 4, s/p bioprosthetic AV replacement with mean gradient 20.  - Co-ox 60% on  milrinone  0.25. Off epi. Drop milrinone  to 0.125 - Still mildly volume overloaded, CVP 13-15. Give one dose IV lasix   - Continue Spiro 25 mg daily  3. Atrial flutter: Atypical, noted post-op. She is currently in AFL, rate controlled  - Continue amiodarone  gtt.  - Continue Eliquis  5 mg bid  - DC-CV today 4. AKI: resolved. SCr 1.17 today 5. Anemia: Post-op.  Transfuse hgb < 8. 8.5 today  6. Hypokalemia - K 3.8 - Continue spiro. Supp K as needed 7. Post-op ileus - Resolving    Length of Stay: 11  Toribio Fuel, MD  09/06/2024, 9:18 AM  Advanced Heart Failure Team Pager [phone] (M-F; 7a - 5p)  Please contact [facility] for night-coverage after hours (5p -7a ) and weekends on [email or web address]

## 2024-09-06 NOTE — Progress Notes (Signed)
   NAME:  Katie Jacobson, MRN:  [id number], DOB:  10/20/1955, LOS: 11 ADMISSION DATE:  08/26/2024, CONSULTATION DATE:  08/26/24 REFERRING MD:  Daniel, CHIEF COMPLAINT:  s/p AVR MVR LAA clipping  History of Present Illness:  [name] 2/2 rheumatic valve dz, tobacco use, breast ca s/p R mastectomy and radiation, HTN, HLD  who presented to [facility] 08/26/24 for planned AVR MVR LAA clipping.  Admitted to ICU post op as per pre-op plan PCCM consulted in this setting  Xclamp 232 Total pump 177 EBL 1250 Product 1 Plt 746 cellsaver   Pertinent  Medical History  Rheumatic valve disease Severe AI Severe mitral regurg  HTN HLD Tobacco use  Breast ca s/p R mastectomy, radiation  Significant Hospital Events: Including procedures, antibiotic start and stop dates in addition to other pertinent events   08/26/24 AVR MVR LAA clipping  11/18 extubate 11/19 add milrinone , cont epi. Intractable Nausea and vomiting. Treated w/ Emend  after failing other antiemetics Adv HF called to consult. Recommended continuing epinephrine , and milrinone , added IV lasix  and gtt at 15mg /hr , started on amiodarone  for afib/flutter w/ RVR. Scr increased some. PICC placed but top angled up.  11/21 Weaned off high flow oxygen. Tolerating IV diuresis. Did not tolerate weaning off epi. Recurrent nausea/ vomiting.  11/23 ileus improved advancing diet  11/26: post-op day 8, still having lots of gas pains + BM 11/28 dccv   Interim History / Subjective:   Coox 60 on 0.25 milrinone   Nervous re DCCV   Cr stable   Objective    Blood pressure 118/66, pulse 82, temperature 98.5 F (36.9 C), temperature source Oral, resp. rate (!) 30, height 5' 1 (1.549 m), weight 99.6 kg, last menstrual period 10/10/1996, SpO2 95%. CVP:  [6 mmHg-28 mmHg] 15 mmHg      Intake/Output Summary (Last 24 hours) at 09/06/2024 1003 Last data filed at 09/06/2024 0900 Gross per 24 hour  Intake 1020.09 ml  Output 800 ml   Net 220.09 ml   Filed Weights   09/04/24 0400 09/05/24 0500 09/06/24 0440  Weight: 100.3 kg 98.7 kg 99.6 kg    Examination: General: chronically ill adult F anxious appearing  Neuro: AAO  HEENT: NCAT  Pulmonary: symmetrical chest expansion, even and unlabored  Cardiac: s1s2 cap refill < 3 sec  Abdomen: ndnt  GU: no foley  Extremities: no clubbing, cyanosis     Resolved problem list  Endotracheally intubated  Expected post-op thrombocytopenia Hypoxic resp failure   Assessment and Plan   S/p MVR AVR LAA clipping LBBB Aflutter  RV failure P - HF following  -decr milrinone  to 0.125, follow coox  -For DCCV -- NPO sips w meds  -diuresis per HF -amio, eliquis  -spiro  -ASA statin  -optimize lytes  (takes 10mEq tabs Kcl ok)   Mixed restrictive/obstructive lung dz  -pre op pfts Tobacco use disorder Hx breast ca s/p R mastectomy, radiation  -improving pulm edema, atelectasis  P -cont pulm hygiene, mobility efforts -weaned off O2 -- goal > 92  -PRN bronchodilator   AKI - stable Hyponatremia  P -follow renal indices uop   Constipation, improving  P -cont bowel reg, might be able to de-escalate in coming days   Anemia -follow CBC  Hx HTN Hx HLD -holding home meds    Moderate MDM   Ronnald Gave MSN, AGACNP-BC [facility] Amion for pager 09/06/2024, 10:03 AM

## 2024-09-07 ENCOUNTER — Other Ambulatory Visit: Payer: Self-pay

## 2024-09-07 ENCOUNTER — Inpatient Hospital Stay (HOSPITAL_COMMUNITY)

## 2024-09-07 DIAGNOSIS — N179 Acute kidney failure, unspecified: Secondary | ICD-10-CM | POA: Diagnosis not present

## 2024-09-07 DIAGNOSIS — Z952 Presence of prosthetic heart valve: Secondary | ICD-10-CM | POA: Diagnosis not present

## 2024-09-07 DIAGNOSIS — Z9889 Other specified postprocedural states: Secondary | ICD-10-CM | POA: Diagnosis not present

## 2024-09-07 DIAGNOSIS — F17210 Nicotine dependence, cigarettes, uncomplicated: Secondary | ICD-10-CM | POA: Diagnosis not present

## 2024-09-07 DIAGNOSIS — I5021 Acute systolic (congestive) heart failure: Secondary | ICD-10-CM | POA: Diagnosis not present

## 2024-09-07 DIAGNOSIS — I340 Nonrheumatic mitral (valve) insufficiency: Secondary | ICD-10-CM | POA: Diagnosis not present

## 2024-09-07 DIAGNOSIS — I351 Nonrheumatic aortic (valve) insufficiency: Secondary | ICD-10-CM | POA: Diagnosis not present

## 2024-09-07 LAB — COOXEMETRY PANEL
Carboxyhemoglobin: 2.1 % — ABNORMAL HIGH (ref 0.5–1.5)
Methemoglobin: <0.7 % (ref 0.0–1.5)
O2 Saturation: 64.4 %
Total hemoglobin: 9.4 g/dL — ABNORMAL LOW (ref 12.0–16.0)

## 2024-09-07 LAB — BASIC METABOLIC PANEL WITH GFR
Anion gap: 11 (ref 5–15)
BUN: 12 mg/dL (ref 8–23)
CO2: 27 mmol/L (ref 22–32)
Calcium: 8.2 mg/dL — ABNORMAL LOW (ref 8.9–10.3)
Chloride: 97 mmol/L — ABNORMAL LOW (ref 98–111)
Creatinine, Ser: 1.15 mg/dL — ABNORMAL HIGH (ref 0.44–1.00)
GFR, Estimated: 53 mL/min — ABNORMAL LOW (ref >60)
Glucose, Bld: 159 mg/dL — ABNORMAL HIGH (ref 70–99)
Potassium: 3.8 mmol/L (ref 3.5–5.1)
Sodium: 135 mmol/L (ref 135–145)

## 2024-09-07 LAB — CBC
HCT: 26.4 % — ABNORMAL LOW (ref 36.0–46.0)
Hemoglobin: 8.8 g/dL — ABNORMAL LOW (ref 12.0–15.0)
MCH: 31.2 pg (ref 26.0–34.0)
MCHC: 33.3 g/dL (ref 30.0–36.0)
MCV: 93.6 fL (ref 80.0–100.0)
Platelets: 253 K/uL (ref 150–400)
RBC: 2.82 MIL/uL — ABNORMAL LOW (ref 3.87–5.11)
RDW: 17.9 % — ABNORMAL HIGH (ref 11.5–15.5)
WBC: 7.4 K/uL (ref 4.0–10.5)
nRBC: 0 % (ref 0.0–0.2)

## 2024-09-07 LAB — MAGNESIUM: Magnesium: 2.1 mg/dL (ref 1.7–2.4)

## 2024-09-07 LAB — ECHOCARDIOGRAM LIMITED
AV Mean grad: 14 mmHg
AV Peak grad: 24 mmHg
Ao pk vel: 2.45 m/s
Area-P 1/2: 3.53 cm2
Height: 61 in
Weight: 3432.12 [oz_av]

## 2024-09-07 MED ORDER — AMIODARONE HCL 200 MG PO TABS
200.0000 mg | ORAL_TABLET | Freq: Every day | ORAL | Status: DC
  Administered 2024-09-07: 200 mg via ORAL
  Filled 2024-09-07: qty 1

## 2024-09-07 MED ORDER — KETOROLAC TROMETHAMINE 15 MG/ML IJ SOLN
15.0000 mg | Freq: Three times a day (TID) | INTRAMUSCULAR | Status: DC | PRN
  Administered 2024-09-07 (×2): 15 mg via INTRAVENOUS
  Filled 2024-09-07 (×2): qty 1

## 2024-09-07 MED ORDER — POTASSIUM CHLORIDE CRYS ER 10 MEQ PO TBCR: 40.0000 meq | EXTENDED_RELEASE_TABLET | Freq: Once | ORAL | Status: DC

## 2024-09-07 MED ORDER — POTASSIUM CHLORIDE 20 MEQ PO PACK
40.0000 meq | PACK | Freq: Once | ORAL | Status: AC
  Administered 2024-09-07: 40 meq via ORAL
  Filled 2024-09-07: qty 2

## 2024-09-07 MED ORDER — ACETAMINOPHEN 500 MG PO TABS
500.0000 mg | ORAL_TABLET | Freq: Four times a day (QID) | ORAL | Status: DC
  Administered 2024-09-07 – 2024-09-11 (×17): 500 mg via ORAL
  Filled 2024-09-07 (×17): qty 1

## 2024-09-07 MED ORDER — TORSEMIDE 20 MG PO TABS
40.0000 mg | ORAL_TABLET | Freq: Every day | ORAL | Status: DC
  Administered 2024-09-07: 40 mg via ORAL
  Filled 2024-09-07: qty 2

## 2024-09-07 NOTE — Progress Notes (Signed)
 12 Days Post-Op Procedure(s) (LRB): REPLACEMENT, AORTIC VALVE, OPEN, UTILIZING INSPIRIS RESILIA  AORTIC VALVE (N/A) REPLACEMENT, MITRAL VALVE, UTILIZING MITRIS RESILIA MITRAL VALVE (N/A) CLIPPING, LEFT ATRIAL APPENDAGE UTILIZING ATRICURE PRO 40 (N/A) ECHOCARDIOGRAM, TRANSESOPHAGEAL, INTRAOPERATIVE (N/A) Subjective: Doing well this morning Co-ox 64 on 0.125 milrinone  Still having back pain while in the chair but otherwise good Ambulated 2 full laps this morning DCCV  yesterday, in NSR for an hour, back in Aflutter  Objective: Vital signs in last 24 hours: BP 99/73   Pulse 75   Temp 98.2 F (36.8 C) (Oral)   Resp (!) 32   Ht 5' 1 (1.549 m)   Wt 97.3 kg   LMP 10/10/1996   SpO2 96%   BMI 40.53 kg/m  Filed Weights   09/05/24 0500 09/06/24 0440 09/07/24 0444  Weight: 98.7 kg 99.6 kg 97.3 kg    Hemodynamic parameters for last 24 hours: CVP:  [1 mmHg-29 mmHg] 1 mmHg  Intake/Output from previous day: 11/28 0701 - 11/29 0700 In: 751.6 [P.O.:240; I.V.:511.6] Out: 1775 [Urine:1775] Intake/Output this shift: Total I/O In: 20.6 [I.V.:20.6] Out: -   Physical Exam: General - Sitting in chair, appears comfortable CV - Aflutter in 80s Resp - Unlabored on RA Abd - Soft, decreased distention, non-tender to deep palpation Ext - Minimal edema  Lab Results:    Latest Ref Rng & Units 09/07/2024    4:33 AM 09/06/2024    4:29 AM 09/05/2024    4:59 AM  CBC  WBC 4.0 - 10.5 K/uL 7.4  7.2  8.5   Hemoglobin 12.0 - 15.0 g/dL 8.8  8.5  8.8   Hematocrit 36.0 - 46.0 % 26.4  25.4  26.3   Platelets 150 - 400 K/uL 253  209  218       Latest Ref Rng & Units 09/07/2024    4:33 AM 09/06/2024    4:29 AM 09/05/2024    4:59 AM  CMP  Glucose 70 - 99 mg/dL 840  858  844   BUN 8 - 23 mg/dL 12  12  15    Creatinine 0.44 - 1.00 mg/dL 8.84  8.82  8.83   Sodium 135 - 145 mmol/L 135  132  132   Potassium 3.5 - 5.1 mmol/L 3.8  3.8  3.9   Chloride 98 - 111 mmol/L 97  94  92   CO2 22 - 32  mmol/L 27  27  27    Calcium  8.9 - 10.3 mg/dL 8.2  8.0  8.1     CXR: No new imaging  Assessment/Plan: S/P Procedure(s) (LRB): REPLACEMENT, AORTIC VALVE, OPEN, UTILIZING INSPIRIS RESILIA  AORTIC VALVE (N/A) REPLACEMENT, MITRAL VALVE, UTILIZING MITRIS RESILIA MITRAL VALVE (N/A) CLIPPING, LEFT ATRIAL APPENDAGE UTILIZING ATRICURE PRO 40 (N/A) ECHOCARDIOGRAM, TRANSESOPHAGEAL, INTRAOPERATIVE (N/A) POD12 s/p bioAVR/MVR with LAAL  Milrinone  off today Will add toradol  PRN for ambulation Continue gentle diuresis per HF team if needed but appears fairly euvolemic Continue Eliquis  for A flutter, transition to PO amio once off milrinone  Encourage ambulation, continue to encourage PO intake  Dispo: ICU   LOS: 12 days    Con RAMAN Oona Trammel 09/07/2024

## 2024-09-07 NOTE — Progress Notes (Signed)
 Patient ID: Katie Jacobson, female   DOB: 05-11-1956, 68 y.o.   MRN: [id number]     Advanced Heart Failure Rounding Note  Cardiologist: Alm Clay, MD  Chief Complaint: CHF Subjective:    POD# 12  Underwent DC-CV yesterday. Rhythm today looks junctional with rate faster than her sinus   Feels good.   Co-ox 64% on milrinone  0.125. Milrinone  stopped CVP 10  +BM  Post-op echo showed LV EF 65%, mild RV dilation with normal systolic function but there is likely severe tricuspid regurgitation, s/p bioprosthetic MV replacement with mean gradient 4, s/p bioprosthetic AV replacement with mean gradient 20.    Objective:   Weight Range: 97.3 kg Body mass index is 40.53 kg/m.   Vital Signs:   Temp:  [97.8 F (36.6 C)-98.7 F (37.1 C)] 97.9 F (36.6 C) (11/29 0923) Pulse Rate:  [73-97] 75 (11/29 0800) Resp:  [13-43] 32 (11/29 0800) BP: (89-145)/(64-98) 99/73 (11/29 0700) SpO2:  [91 %-100 %] 96 % (11/29 0800) Weight:  [97.3 kg] 97.3 kg (11/29 0444) Last BM Date : 09/06/24  Weight change: Filed Weights   09/05/24 0500 09/06/24 0440 09/07/24 0444  Weight: 98.7 kg 99.6 kg 97.3 kg    Intake/Output:   Intake/Output Summary (Last 24 hours) at 09/07/2024 1059 Last data filed at 09/07/2024 0800 Gross per 24 hour  Intake 698.54 ml  Output 1775 ml  Net -1076.46 ml      Physical Exam   General:  Sitting up in bed. No resp difficulty HEENT: normal Neck: supple. JVP 10 Cor: Regular rate & rhythm. No rubs, gallops or murmurs. Lungs: clear Abdomen: soft, nontender, nondistended.Good bowel sounds. Extremities: no cyanosis, clubbing, rash, tr edema Neuro: alert & orientedx3, cranial nerves grossly intact. moves all 4 extremities w/o difficulty. Affect pleasant   Telemetry   Junctional with slower sinus 70-90s Personally reviewed   Labs    CBC Recent Labs    09/06/24 0429 09/07/24 0433  WBC 7.2 7.4  HGB 8.5* 8.8*  HCT 25.4* 26.4*  MCV 92.7 93.6  PLT 209 253    Basic Metabolic Panel Recent Labs    88/71/74 0429 09/07/24 0433  NA 132* 135  K 3.8 3.8  CL 94* 97*  CO2 27 27  GLUCOSE 141* 159*  BUN 12 12  CREATININE 1.17* 1.15*  CALCIUM  8.0* 8.2*  MG 2.0 2.1   Liver Function Tests No results for input(s): AST, ALT, ALKPHOS, BILITOT, PROT, ALBUMIN  in the last 72 hours. No results for input(s): LIPASE, AMYLASE in the last 72 hours. Cardiac Enzymes No results for input(s): CKTOTAL, CKMB, CKMBINDEX, TROPONINI in the last 72 hours.  BNP: BNP (last 3 results) No results for input(s): BNP in the last 8760 hours.  ProBNP (last 3 results) No results for input(s): PROBNP in the last 8760 hours.   D-Dimer No results for input(s): DDIMER in the last 72 hours. Hemoglobin A1C No results for input(s): HGBA1C in the last 72 hours. Fasting Lipid Panel No results for input(s): CHOL, HDL, LDLCALC, TRIG, CHOLHDL, LDLDIRECT in the last 72 hours. Thyroid  Function Tests No results for input(s): TSH, T4TOTAL, T3FREE, THYROIDAB in the last 72 hours.  Invalid input(s): FREET3  Other results:   Imaging    No results found.     Medications:     Scheduled Medications:  acetaminophen   500 mg Oral QID   amiodarone   200 mg Oral Daily   apixaban   5 mg Oral BID   aspirin  EC  81 mg Oral  Daily   bisacodyl   10 mg Oral Daily   Or   bisacodyl   10 mg Rectal Daily   Chlorhexidine  Gluconate Cloth  6 each Topical Daily   feeding supplement  237 mL Oral BID BM   lidocaine   1 patch Transdermal Q24H   pantoprazole   40 mg Oral Daily   polyethylene glycol  17 g Oral Daily   potassium chloride   40 mEq Oral Once   senna  1 tablet Oral Daily   sodium chloride  flush  10-40 mL Intracatheter Q12H   sodium chloride  flush  3 mL Intravenous Q12H   spironolactone   25 mg Oral Daily   torsemide   40 mg Oral Daily    Infusions:  promethazine  (PHENERGAN ) injection (IM or IVPB) Stopped (08/31/24 1154)     PRN Medications: acetaminophen , albuterol , HYDROmorphone  (DILAUDID ) injection, hydrOXYzine , ketorolac , methocarbamol , ondansetron  (ZOFRAN ) IV, mouth rinse, oxyCODONE , polyethylene glycol, promethazine  (PHENERGAN ) injection (IM or IVPB), simethicone , sodium chloride  flush, sodium chloride  flush    Assessment/Plan   1. Rheumatic heart disease: Severe MR and severe AI on 9/25 TEE with EF 55-60%. S/p AVR with 21 mm Inspiris valve, MVR with 27 mm Mitris valve and LAA ligation by Dr. Ayden Apodaca on 08/26/24. Post-op echo (11/20) showed LV EF 65%, mild RV dilation with normal systolic function but there is likely severe tricuspid regurgitation, s/p bioprosthetic MV replacement with mean gradient 4, s/p bioprosthetic AV replacement with mean gradient 20.   2. Post-op cardiogenic shock/RV failure:  Echo post-op on 11/20 showed LV EF 65%, mild RV dilation with normal systolic function but there is likely severe tricuspid regurgitation, s/p bioprosthetic MV replacement with mean gradient 4, s/p bioprosthetic AV replacement with mean gradient 20.  - Co-ox 64% on  milrinone  0.25. Stop milrinone  - VOlume status much improved. CVP 10. Will start oral diuretics to keep even - Continue Spiro 25 mg daily  3. Atrial flutter: Atypical, noted post-op. - s/p DC-CV 11/28. Looks like slow sinus with faster junctional. Get ECG - Continue Eliquis  5 mg bid  4. AKI: resolved. SCr 1.15 today 5. Anemia: Post-op.  Transfuse hgb < 8. 8.5 today  6. Hypokalemia - K 3.5 - Continue spiro. Supp K as needed 7. Post-op ileus - Resolved    Length of Stay: 12  Toribio Fuel, MD  09/07/2024, 10:59 AM  Advanced Heart Failure Team Pager [phone] (M-F; 7a - 5p)  Please contact [facility] for night-coverage after hours (5p -7a ) and weekends on [email or web address]

## 2024-09-07 NOTE — Progress Notes (Signed)
  Echocardiogram 2D Echocardiogram has been performed.  Koleen KANDICE Popper, RDCS 09/07/2024, 2:31 PM

## 2024-09-07 NOTE — Progress Notes (Signed)
 NAME:  Katie Jacobson, MRN:  [id number], DOB:  [birth year], LOS: 12 ADMISSION DATE:  08/26/2024, CONSULTATION DATE:  08/26/24 REFERRING MD:  Daniel, CHIEF COMPLAINT:  s/p AVR MVR LAA clipping  History of Present Illness:  [name] 2/2 rheumatic valve dz, tobacco use, breast ca s/p R mastectomy and radiation, HTN, HLD  who presented to [facility] 08/26/24 for planned AVR MVR LAA clipping.  Admitted to ICU post op as per pre-op plan PCCM consulted in this setting  Xclamp 232 Total pump 177 EBL 1250 Product 1 Plt 746 cellsaver   Pertinent  Medical History  Rheumatic valve disease Severe AI Severe mitral regurg  HTN HLD Tobacco use  Breast ca s/p R mastectomy, radiation  Significant Hospital Events: Including procedures, antibiotic start and stop dates in addition to other pertinent events   08/26/24 AVR MVR LAA clipping  11/18 extubate 11/19 add milrinone , cont epi. Intractable Nausea and vomiting. Treated w/ Emend  after failing other antiemetics Adv HF called to consult. Recommended continuing epinephrine , and milrinone , added IV lasix  and gtt at 15mg /hr , started on amiodarone  for afib/flutter w/ RVR. Scr increased some. PICC placed but top angled up.  11/21 Weaned off high flow oxygen. Tolerating IV diuresis. Did not tolerate weaning off epi. Recurrent nausea/ vomiting.  11/23 ileus improved advancing diet  11/26: post-op day 8, still having lots of gas pains + BM 11/28 dccv  11/29 rate controlled flutter. Dc milrinone    Interim History / Subjective:   DCCV yesterday w transient rhythm change to sinus -- now back in flutter  Recalls hallucinating during the procedure   Coox 64 on 0.125 milrinone   Cr 1.15 K 3.8  Objective    Blood pressure 99/73, pulse 75, temperature 97.9 F (36.6 C), temperature source Oral, resp. rate (!) 32, height 5' 1 (1.549 m), weight 97.3 kg, last menstrual period 10/10/1996, SpO2 96%. CVP:  [1 mmHg-29 mmHg] 1 mmHg       Intake/Output Summary (Last 24 hours) at 09/07/2024 1011 Last data filed at 09/07/2024 0800 Gross per 24 hour  Intake 698.54 ml  Output 1775 ml  Net -1076.46 ml   Filed Weights   09/05/24 0500 09/06/24 0440 09/07/24 0444  Weight: 98.7 kg 99.6 kg 97.3 kg    Examination: General: very pleasant chronically ill appearing adult F NAD  Neuro: AAOx4  HEENT: NCAT pink mm  Pulmonary: even unlabored on RA  Cardiac: cap refill <  3 sec  Abdomen: soft  GU: defer  Extremities: BLE compression   Resolved problem list  Endotracheally intubated  Expected post-op thrombocytopenia Hypoxic resp failure  Hyponatremia   Assessment and Plan   S/p MVR AVR LAAL LBBB Aflutter -dccv 11/28 but looks like back in flutter RV dysfunction   HTN HLD  P -post op per CVTS -HF following  -stop milrinone  -ECHO off milrinone  -cont eliquis , plan to change amio to PO  -optimize lytes  -ASA statin spiro  -diuresis per HF  Mixed restrictive/obstructive lung dz Tobacco use disorder Hx breast ca s/p R mastectomy and radiation P -cont pulm hygiene,  mobility   AKI - improving  P -follow renal indices uop -as above diuresis per HF   Constipation, c/f ileus - improving  P -de-escalate as Bms improve  Anemia -follow CBC -- can probably space this out to every other day but defer to primary   Anxiety -PRN atarax    AoC back pain -SCH APAP -Add PRN toradol   x3 -cont muscle relaxer -lido patch -hot pad -mobility -with recent ileus trying to spare opiates but are available   High MDM    Ronnald Gave MSN, AGACNP-BC [facility] Amion for pager  09/07/2024, 10:11 AM

## 2024-09-07 NOTE — Plan of Care (Signed)
  Problem: Education: Goal: Knowledge of General Education information will improve Description: Including pain rating scale, medication(s)/side effects and non-pharmacologic comfort measures Outcome: Progressing   Problem: Health Behavior/Discharge Planning: Goal: Ability to manage health-related needs will improve Outcome: Progressing   Problem: Clinical Measurements: Goal: Ability to maintain clinical measurements within normal limits will improve Outcome: Progressing Goal: Will remain free from infection Outcome: Progressing Goal: Diagnostic test results will improve Outcome: Progressing Goal: Respiratory complications will improve Outcome: Progressing Goal: Cardiovascular complication will be avoided Outcome: Progressing   Problem: Activity: Goal: Risk for activity intolerance will decrease Outcome: Progressing   Problem: Nutrition: Goal: Adequate nutrition will be maintained Outcome: Progressing   Problem: Coping: Goal: Level of anxiety will decrease Outcome: Progressing   Problem: Elimination: Goal: Will not experience complications related to bowel motility Outcome: Progressing Goal: Will not experience complications related to urinary retention Outcome: Progressing   Problem: Pain Managment: Goal: General experience of comfort will improve and/or be controlled Outcome: Progressing   Problem: Safety: Goal: Ability to remain free from injury will improve Outcome: Progressing   Problem: Skin Integrity: Goal: Risk for impaired skin integrity will decrease Outcome: Progressing   Problem: Activity: Goal: Ability to tolerate increased activity will improve Outcome: Progressing   Problem: Respiratory: Goal: Ability to maintain a clear airway and adequate ventilation will improve Outcome: Progressing   Problem: Role Relationship: Goal: Method of communication will improve Outcome: Progressing   Problem: Education: Goal: Will demonstrate proper wound  care and an understanding of methods to prevent future damage Outcome: Progressing Goal: Knowledge of disease or condition will improve Outcome: Progressing Goal: Knowledge of the prescribed therapeutic regimen will improve Outcome: Progressing   Problem: Activity: Goal: Risk for activity intolerance will decrease Outcome: Progressing

## 2024-09-08 DIAGNOSIS — Z952 Presence of prosthetic heart valve: Secondary | ICD-10-CM

## 2024-09-08 LAB — CBC
HCT: 28.8 % — ABNORMAL LOW (ref 36.0–46.0)
Hemoglobin: 9.5 g/dL — ABNORMAL LOW (ref 12.0–15.0)
MCH: 31.7 pg (ref 26.0–34.0)
MCHC: 33 g/dL (ref 30.0–36.0)
MCV: 96 fL (ref 80.0–100.0)
Platelets: 281 K/uL (ref 150–400)
RBC: 3 MIL/uL — ABNORMAL LOW (ref 3.87–5.11)
RDW: 18.6 % — ABNORMAL HIGH (ref 11.5–15.5)
WBC: 6.5 K/uL (ref 4.0–10.5)
nRBC: 0 % (ref 0.0–0.2)

## 2024-09-08 LAB — BASIC METABOLIC PANEL WITH GFR
Anion gap: 10 (ref 5–15)
BUN: 19 mg/dL (ref 8–23)
CO2: 27 mmol/L (ref 22–32)
Calcium: 8.5 mg/dL — ABNORMAL LOW (ref 8.9–10.3)
Chloride: 101 mmol/L (ref 98–111)
Creatinine, Ser: 1.48 mg/dL — ABNORMAL HIGH (ref 0.44–1.00)
GFR, Estimated: 39 mL/min — ABNORMAL LOW (ref >60)
Glucose, Bld: 110 mg/dL — ABNORMAL HIGH (ref 70–99)
Potassium: 4.3 mmol/L (ref 3.5–5.1)
Sodium: 138 mmol/L (ref 135–145)

## 2024-09-08 LAB — COOXEMETRY PANEL
Carboxyhemoglobin: 1.7 % — ABNORMAL HIGH (ref 0.5–1.5)
Carboxyhemoglobin: 0.8 % (ref 0.5–1.5)
Methemoglobin: <0.7 % (ref 0.0–1.5)
Methemoglobin: <0.7 % (ref 0.0–1.5)
O2 Saturation: 48.8 %
O2 Saturation: 56.1 %
Total hemoglobin: 9.9 g/dL — ABNORMAL LOW (ref 12.0–16.0)
Total hemoglobin: 9.2 g/dL — ABNORMAL LOW (ref 12.0–16.0)

## 2024-09-08 LAB — MAGNESIUM: Magnesium: 2.2 mg/dL (ref 1.7–2.4)

## 2024-09-08 MED ORDER — SODIUM CHLORIDE 0.9% FLUSH: 3.0000 mL | INTRAVENOUS | Status: DC | PRN

## 2024-09-08 MED ORDER — ~~LOC~~ CARDIAC SURGERY, PATIENT & FAMILY EDUCATION: Freq: Once | Status: DC

## 2024-09-08 MED ORDER — SODIUM CHLORIDE 0.9% FLUSH
3.0000 mL | Freq: Two times a day (BID) | INTRAVENOUS | Status: DC
  Administered 2024-09-08 – 2024-09-12 (×3): 3 mL via INTRAVENOUS

## 2024-09-08 MED ORDER — SODIUM CHLORIDE 0.9 % IV SOLN: 250.0000 mL | INTRAVENOUS | Status: AC | PRN

## 2024-09-08 NOTE — Progress Notes (Signed)
 13 Days Post-Op Procedure(s) (LRB): REPLACEMENT, AORTIC VALVE, OPEN, UTILIZING INSPIRIS RESILIA  AORTIC VALVE (N/A) REPLACEMENT, MITRAL VALVE, UTILIZING MITRIS RESILIA MITRAL VALVE (N/A) CLIPPING, LEFT ATRIAL APPENDAGE UTILIZING ATRICURE PRO 40 (N/A) ECHOCARDIOGRAM, TRANSESOPHAGEAL, INTRAOPERATIVE (N/A) Subjective: Doing well this morning Co-ox 58 off milrinone  Ambulated a full lap this morning without resting Appears to be in complete heart block this morning, previously Aflutter  Objective: Vital signs in last 24 hours: BP (!) 119/98   Pulse 70   Temp 97.8 F (36.6 C) (Oral)   Resp (!) 31   Ht 5' 1 (1.549 m)   Wt 94.5 kg   LMP 10/10/1996   SpO2 99%   BMI 39.36 kg/m  Filed Weights   09/06/24 0440 09/07/24 0444 09/08/24 0500  Weight: 99.6 kg 97.3 kg 94.5 kg    Hemodynamic parameters for last 24 hours: CVP:  [3 mmHg-18 mmHg] 8 mmHg  Intake/Output from previous day: 11/29 0701 - 11/30 0700 In: 1003.4 [P.O.:960; I.V.:43.4] Out: -  Intake/Output this shift: No intake/output data recorded.  Physical Exam: General - Sitting in bed, appears comfortable CV - CHB in 70s Resp - Unlabored on RA Abd - Soft, decreased distention, non-tender to deep palpation Ext - Minimal edema  Lab Results:    Latest Ref Rng & Units 09/08/2024    4:48 AM 09/07/2024    4:33 AM 09/06/2024    4:29 AM  CBC  WBC 4.0 - 10.5 K/uL 6.5  7.4  7.2   Hemoglobin 12.0 - 15.0 g/dL 9.5  8.8  8.5   Hematocrit 36.0 - 46.0 % 28.8  26.4  25.4   Platelets 150 - 400 K/uL 281  253  209       Latest Ref Rng & Units 09/08/2024    4:48 AM 09/07/2024    4:33 AM 09/06/2024    4:29 AM  CMP  Glucose 70 - 99 mg/dL 889  840  858   BUN 8 - 23 mg/dL 19  12  12    Creatinine 0.44 - 1.00 mg/dL 8.51  8.84  8.82   Sodium 135 - 145 mmol/L 138  135  132   Potassium 3.5 - 5.1 mmol/L 4.3  3.8  3.8   Chloride 98 - 111 mmol/L 101  97  94   CO2 22 - 32 mmol/L 27  27  27    Calcium  8.9 - 10.3 mg/dL 8.5  8.2   8.0     CXR: No new imaging  Assessment/Plan: S/P Procedure(s) (LRB): REPLACEMENT, AORTIC VALVE, OPEN, UTILIZING INSPIRIS RESILIA  AORTIC VALVE (N/A) REPLACEMENT, MITRAL VALVE, UTILIZING MITRIS RESILIA MITRAL VALVE (N/A) CLIPPING, LEFT ATRIAL APPENDAGE UTILIZING ATRICURE PRO 40 (N/A) ECHOCARDIOGRAM, TRANSESOPHAGEAL, INTRAOPERATIVE (N/A) POD13 s/p bioAVR/MVR with LAAL  Off milrinone , Co-ox 58 Hold diuresis today given small bump in creatinine TTE on 11/30 with normal BiV function, AV MG 14 mmHg Appears to be in CHB this morning, will obtain EKG and hold amiodarone  Continue gentle diuresis per HF team if needed but appears fairly euvolemic Continue Eliquis  for A flutter Encourage ambulation, continue to encourage PO intake  Dispo: ICU, maybe floor later today   LOS: 13 days    Con Katie Jacobson 09/08/2024

## 2024-09-08 NOTE — Progress Notes (Signed)
 Patient ID: Katie Jacobson, female   DOB: Jun 27, 1956, 68 y.o.   MRN: [id number]     Advanced Heart Failure Rounding Note  Cardiologist: Alm Clay, MD  Chief Complaint: CHF Subjective:    POD# 13  Underwent DC-CV 11/28  Remains in sinus with CHB  Feels good. Walking halls   Co-ox 56% off milrinone   Scr 1.15 -> 1.48   Echo 11/29 EF 60% RV ok. Mean AVG 14. Mild TR  Objective:   Weight Range: 94.5 kg Body mass index is 39.36 kg/m.   Vital Signs:   Temp:  [97.8 F (36.6 C)-98.3 F (36.8 C)] 97.8 F (36.6 C) (11/30 0828) Pulse Rate:  [62-76] 71 (11/30 1000) Resp:  [16-35] 20 (11/30 1000) BP: (80-137)/(58-114) 120/90 (11/30 1000) SpO2:  [95 %-100 %] 98 % (11/30 1000) Weight:  [94.5 kg] 94.5 kg (11/30 0500) Last BM Date : 09/07/24  Weight change: Filed Weights   09/06/24 0440 09/07/24 0444 09/08/24 0500  Weight: 99.6 kg 97.3 kg 94.5 kg    Intake/Output:   Intake/Output Summary (Last 24 hours) at 09/08/2024 1028 Last data filed at 09/07/2024 1800 Gross per 24 hour  Intake 742.85 ml  Output --  Net 742.85 ml      Physical Exam   General:  Sitting up in bed. No resp difficulty HEENT: normal Neck: supple. JVP 6-7 Cor: Regular rate & rhythm. No rubs, gallops or murmurs. Lungs: clear Abdomen: soft, nontender, nondistended.Good bowel sounds. Extremities: no cyanosis, clubbing, rash, edema Neuro: alert & orientedx3, cranial nerves grossly intact. moves all 4 extremities w/o difficulty. Affect pleasant  Telemetry   Sinus with CHB 70-80s Personally reviewed   Labs    CBC Recent Labs    09/07/24 0433 09/08/24 0448  WBC 7.4 6.5  HGB 8.8* 9.5*  HCT 26.4* 28.8*  MCV 93.6 96.0  PLT 253 281   Basic Metabolic Panel Recent Labs    88/70/74 0433 09/08/24 0448  NA 135 138  K 3.8 4.3  CL 97* 101  CO2 27 27  GLUCOSE 159* 110*  BUN 12 19  CREATININE 1.15* 1.48*  CALCIUM  8.2* 8.5*  MG 2.1 2.2   Liver Function Tests No results for input(s):  AST, ALT, ALKPHOS, BILITOT, PROT, ALBUMIN  in the last 72 hours. No results for input(s): LIPASE, AMYLASE in the last 72 hours. Cardiac Enzymes No results for input(s): CKTOTAL, CKMB, CKMBINDEX, TROPONINI in the last 72 hours.  BNP: BNP (last 3 results) No results for input(s): BNP in the last 8760 hours.  ProBNP (last 3 results) No results for input(s): PROBNP in the last 8760 hours.   D-Dimer No results for input(s): DDIMER in the last 72 hours. Hemoglobin A1C No results for input(s): HGBA1C in the last 72 hours. Fasting Lipid Panel No results for input(s): CHOL, HDL, LDLCALC, TRIG, CHOLHDL, LDLDIRECT in the last 72 hours. Thyroid  Function Tests No results for input(s): TSH, T4TOTAL, T3FREE, THYROIDAB in the last 72 hours.  Invalid input(s): FREET3  Other results:   Imaging    ECHOCARDIOGRAM LIMITED Result Date: 09/07/2024    ECHOCARDIOGRAM LIMITED REPORT   Patient Name:   Katie Jacobson Date of Exam: 09/07/2024 Medical Rec #:  [id number]        Height:       61.0 in Accession #:    [id number]       Weight:       214.5 lb Date of Birth:  1956-01-26       BSA:  1.946 m Patient Age:    67 years         BP:           99/73 mmHg Patient Gender: F                HR:           72 bpm. Exam Location:  Inpatient Procedure: Limited Echo, Cardiac Doppler and Color Doppler (Both Spectral and            Color Flow Doppler were utilized during procedure). Indications:    s/p Aortic Valve Replacement Z95.2  History:        Patient has prior history of Echocardiogram examinations, most                 recent 08/29/2024. Abnormal ECG, cancer, Aortic Valve Disease,                 Mitral Valve Disease and s/p AVR, there is a 21 mm Insiris                 Resila valve is present in the aortic position. Procedure date:                 08/26/2024, s/p MVR, there is a 27 mm Mitris Resilia valve is                 present in mitral position.  Procedure date: 08/26/2024; Risk                 Factors:Current Smoker and Hypertension.                 Aortic Valve: 21 mm Insiris Resila valve is present in the                 aortic position.                 Mitral Valve: Mitris Resila valve is present in the mitral                 position.  Sonographer:    Koleen Popper RDCS Referring Phys: [id number] TORIBIO BROCKS SMITH IMPRESSIONS  1. Left ventricular ejection fraction, by estimation, is 60 to 65%. The left ventricle has normal function. The left ventricle has no regional wall motion abnormalities. Left ventricular diastolic function could not be evaluated.  2. Right ventricular systolic function is normal. The right ventricular size is normal. There is normal pulmonary artery systolic pressure. The estimated right ventricular systolic pressure is 31.3 mmHg.  3. The mitral valve has been repaired/replaced. Trivial mitral valve regurgitation. No evidence of mitral stenosis. The mean mitral valve gradient is 3.0 mmHg. There is a Mitris Resila present in the mitral position.  4. The aortic valve has been repaired/replaced. Aortic valve regurgitation is not visualized. No aortic stenosis is present. There is a 21 mm Insiris Resila valve present in the aortic position. Aortic valve mean gradient measures 14.0 mmHg. Aortic valve Vmax measures 2.45 m/s.  5. The inferior vena cava is dilated in size with <50% respiratory variability, suggesting right atrial pressure of 15 mmHg.  6. Compared to study dated 08/29/2024, the mean AVG has decreased from to , Vmax has decreased from 3.47m/s to 2.73m/s and DI has increased from 0.5 to 0.8. There is a normal functioning bioprosthetic MVR present with no significant change from prior study. PA paressure has normalized. RAP remains elevated at . FINDINGS  Left Ventricle: Left  ventricular ejection fraction, by estimation, is 60 to 65%. The left ventricle has normal function. The left ventricle has no regional  wall motion abnormalities. The left ventricular internal cavity size was normal in size. There is  no left ventricular hypertrophy. Abnormal (paradoxical) septal motion consistent with post-operative status. Left ventricular diastolic function could not be evaluated. Left ventricular diastolic function could not be evaluated due to mitral valve replacement. Right Ventricle: The right ventricular size is normal. No increase in right ventricular wall thickness. Right ventricular systolic function is normal. There is normal pulmonary artery systolic pressure. The tricuspid regurgitant velocity is 2.02 m/s, and  with an assumed right atrial pressure of 15 mmHg, the estimated right ventricular systolic pressure is 31.3 mmHg. Left Atrium: Left atrial size was normal in size. Right Atrium: Right atrial size was normal in size. Pericardium: There is no evidence of pericardial effusion. Mitral Valve: The mitral valve has been repaired/replaced. Trivial mitral valve regurgitation. There is a Mitris Resila present in the mitral position. No evidence of mitral valve stenosis. MV peak gradient, 12.0 mmHg. The mean mitral valve gradient is 3.0 mmHg. Tricuspid Valve: The tricuspid valve is normal in structure. Tricuspid valve regurgitation is mild . No evidence of tricuspid stenosis. Aortic Valve: The aortic valve has been repaired/replaced. Aortic valve regurgitation is not visualized. No aortic stenosis is present. Aortic valve mean gradient measures 14.0 mmHg. Aortic valve peak gradient measures 24.0 mmHg. There is a 21 mm Insiris  Resila valve present in the aortic position. Pulmonic Valve: The pulmonic valve was normal in structure. Pulmonic valve regurgitation is not visualized. No evidence of pulmonic stenosis. Aorta: The aortic root is normal in size and structure. Venous: The inferior vena cava is dilated in size with less than 50% respiratory variability, suggesting right atrial pressure of 15 mmHg. IAS/Shunts: No atrial  level shunt detected by color flow Doppler. Additional Comments: Spectral Doppler performed. Color Doppler performed.   Diastology LV e' medial:    9.46 cm/s LV E/e' medial:  14.6 LV e' lateral:   11.10 cm/s LV E/e' lateral: 12.4  IVC IVC diam: 2.60 cm LEFT ATRIUM           Index LA Vol (A2C): 36.2 ml 18.60 ml/m  AORTIC VALVE AV Vmax:           245.00 cm/s AV Vmean:          171.000 cm/s AV VTI:            0.354 m AV Peak Grad:      24.0 mmHg AV Mean Grad:      14.0 mmHg LVOT Vmax:         186.00 cm/s LVOT Vmean:        118.000 cm/s LVOT VTI:          0.282 m LVOT/AV VTI ratio: 0.80 MITRAL VALVE                TRICUSPID VALVE MV Area (PHT): 3.53 cm     TR Peak grad:   16.3 mmHg MV Peak grad:  12.0 mmHg    TR Vmax:        202.00 cm/s MV Mean grad:  3.0 mmHg MV Vmax:       1.73 m/s     SHUNTS MV Vmean:      74.6 cm/s    Systemic VTI: 0.28 m MV Decel Time: 215 msec MV E velocity: 138.00 cm/s MV A velocity: 79.80 cm/s MV E/A ratio:  1.73 Wilbert Bihari MD Electronically signed by Wilbert Bihari MD Signature Date/Time: 09/07/2024/3:07:42 PM    Final        Medications:     Scheduled Medications:  acetaminophen   500 mg Oral QID   apixaban   5 mg Oral BID   aspirin  EC  81 mg Oral Daily   bisacodyl   10 mg Oral Daily   Or   bisacodyl   10 mg Rectal Daily   Chlorhexidine  Gluconate Cloth  6 each Topical Daily   feeding supplement  237 mL Oral BID BM   lidocaine   1 patch Transdermal Q24H   pantoprazole   40 mg Oral Daily   polyethylene glycol  17 g Oral Daily   senna  1 tablet Oral Daily   sodium chloride  flush  10-40 mL Intracatheter Q12H   sodium chloride  flush  3 mL Intravenous Q12H   spironolactone   25 mg Oral Daily    Infusions:  promethazine  (PHENERGAN ) injection (IM or IVPB) Stopped (08/31/24 1154)    PRN Medications: acetaminophen , albuterol , HYDROmorphone  (DILAUDID ) injection, hydrOXYzine , ketorolac , methocarbamol , ondansetron  (ZOFRAN ) IV, mouth rinse, oxyCODONE , polyethylene glycol,  promethazine  (PHENERGAN ) injection (IM or IVPB), simethicone , sodium chloride  flush, sodium chloride  flush    Assessment/Plan   1. Rheumatic heart disease: Severe MR and severe AI on 9/25 TEE with EF 55-60%. S/p AVR with 21 mm Inspiris valve, MVR with 27 mm Mitris valve and LAA ligation by Dr. Jasean Ambrosia on 08/26/24. Post-op echo (11/20) showed LV EF 65%, mild RV dilation with normal systolic function but there is likely severe tricuspid regurgitation, s/p bioprosthetic MV replacement with mean gradient 4, s/p bioprosthetic AV replacement with mean gradient 20.   - Echo 11/29 EF 60% RV ok. Mean AVG 14. Mild TR 2. Post-op cardiogenic shock/RV failure:  Echo post-op on 11/20 showed LV EF 65%, mild RV dilation with normal systolic function but there is likely severe tricuspid regurgitation, s/p bioprosthetic MV replacement with mean gradient 4, s/p bioprosthetic AV replacement with mean gradient 20.  - Co-ox 56% off  milrinone  (stopped 11/29) - VOlume status much improved. Scr bumped. Hold torsemide  - Continue Spiro 25 mg daily  3. Atrial flutter: Atypical, noted post-op. - s/p DC-CV 11/28. -> sinus with CHB - Continue Eliquis  5 mg bid  - will stop amio with CHB 4. CHB - will stop amio - will ask EP to see in am 4. AKI: - Scr up slightly today 1.15 -> 1.48 - hold torsemide   5. Anemia: Post-op.  Transfuse hgb < 8. 9.5 today  6. Hypokalemia - K 4.3 - Continue spiro. Supp K as needed 7. Post-op ileus - Resolved  D/w Dr. Raye Belton for floor   Length of Stay: 13  Toribio Fuel, MD  09/08/2024, 10:28 AM  Advanced Heart Failure Team Pager [phone] (M-F; 7a - 5p)  Please contact [facility] for night-coverage after hours (5p -7a ) and weekends on [email or web address]

## 2024-09-08 NOTE — Progress Notes (Addendum)
 09/08/2024 Saw her with Dr. Carri Spillers: ongoing issues with back pain.  Ongoing accelerated junctional rhythm.  Coox down slightly with dropping milrionone.  Cr did take a bump.  Defer discussion regarding diuretics +/- inotropes +/- amio to TCTS/AHF  I hope we can find a good spot for her soon: she is ambulatory and near pre-hospitalization functional status; I think she might recover more quickly at home.  Will follow while in ICU  Rolan Sharps MD PCCM

## 2024-09-09 ENCOUNTER — Inpatient Hospital Stay (HOSPITAL_COMMUNITY)

## 2024-09-09 DIAGNOSIS — I442 Atrioventricular block, complete: Secondary | ICD-10-CM | POA: Diagnosis not present

## 2024-09-09 DIAGNOSIS — I517 Cardiomegaly: Secondary | ICD-10-CM | POA: Diagnosis not present

## 2024-09-09 DIAGNOSIS — J90 Pleural effusion, not elsewhere classified: Secondary | ICD-10-CM | POA: Diagnosis not present

## 2024-09-09 DIAGNOSIS — Z952 Presence of prosthetic heart valve: Secondary | ICD-10-CM | POA: Diagnosis not present

## 2024-09-09 DIAGNOSIS — R918 Other nonspecific abnormal finding of lung field: Secondary | ICD-10-CM | POA: Diagnosis not present

## 2024-09-09 DIAGNOSIS — I4892 Unspecified atrial flutter: Secondary | ICD-10-CM | POA: Diagnosis not present

## 2024-09-09 DIAGNOSIS — Z48812 Encounter for surgical aftercare following surgery on the circulatory system: Secondary | ICD-10-CM | POA: Diagnosis not present

## 2024-09-09 LAB — COOXEMETRY PANEL
Carboxyhemoglobin: 2.1 % — ABNORMAL HIGH (ref 0.5–1.5)
Methemoglobin: <0.7 % (ref 0.0–1.5)
O2 Saturation: 54.6 %
Total hemoglobin: 8.9 g/dL — ABNORMAL LOW (ref 12.0–16.0)

## 2024-09-09 LAB — CBC
HCT: 27.3 % — ABNORMAL LOW (ref 36.0–46.0)
Hemoglobin: 8.7 g/dL — ABNORMAL LOW (ref 12.0–15.0)
MCH: 31.1 pg (ref 26.0–34.0)
MCHC: 31.9 g/dL (ref 30.0–36.0)
MCV: 97.5 fL (ref 80.0–100.0)
Platelets: 282 K/uL (ref 150–400)
RBC: 2.8 MIL/uL — ABNORMAL LOW (ref 3.87–5.11)
RDW: 18.5 % — ABNORMAL HIGH (ref 11.5–15.5)
WBC: 6.9 K/uL (ref 4.0–10.5)
nRBC: 0 % (ref 0.0–0.2)

## 2024-09-09 LAB — BASIC METABOLIC PANEL WITH GFR
Anion gap: 4 — ABNORMAL LOW (ref 5–15)
BUN: 18 mg/dL (ref 8–23)
CO2: 30 mmol/L (ref 22–32)
Calcium: 8.4 mg/dL — ABNORMAL LOW (ref 8.9–10.3)
Chloride: 105 mmol/L (ref 98–111)
Creatinine, Ser: 1.24 mg/dL — ABNORMAL HIGH (ref 0.44–1.00)
GFR, Estimated: 48 mL/min — ABNORMAL LOW (ref >60)
Glucose, Bld: 107 mg/dL — ABNORMAL HIGH (ref 70–99)
Potassium: 4 mmol/L (ref 3.5–5.1)
Sodium: 139 mmol/L (ref 135–145)

## 2024-09-09 LAB — MAGNESIUM: Magnesium: 1.9 mg/dL (ref 1.7–2.4)

## 2024-09-09 MED ORDER — MAGNESIUM SULFATE 2 GM/50ML IV SOLN
2.0000 g | Freq: Once | INTRAVENOUS | Status: AC
  Administered 2024-09-09: 2 g via INTRAVENOUS
  Filled 2024-09-09: qty 50

## 2024-09-09 MED ORDER — TORSEMIDE 20 MG PO TABS
20.0000 mg | ORAL_TABLET | Freq: Every day | ORAL | Status: DC
  Administered 2024-09-09: 20 mg via ORAL
  Filled 2024-09-09: qty 1

## 2024-09-09 NOTE — Progress Notes (Signed)
 Orthopedic Tech Progress Note Patient Details:  Katie Jacobson [birth year] [id number]  Patient is working with PT at this moment, will try again in 2 hours  Patient ID: Katie Jacobson, female   DOB: 05-08-1956, 68 y.o.   MRN: [id number]  Katie Jacobson Pac 09/09/2024, 9:14 AM

## 2024-09-09 NOTE — Progress Notes (Signed)
 Patient ID: Katie Jacobson, female   DOB: 1956/07/07, 68 y.o.   MRN: [id number]     Advanced Heart Failure Rounding Note  Cardiologist: Alm Clay, MD  Chief Complaint: CHF Subjective:    POD# 14  Underwent DC-CV 11/28  Back in [facility] this am.  Very tearful about possible need for PPM  Feels good. Co-ox 55%  Scr 1.48-> 1.24 on po torsemide    Echo 11/29 EF 60% RV ok. Mean AVG 14. Mild TR  Objective:   Weight Range: 97.4 kg Body mass index is 40.57 kg/m.   Vital Signs:   Temp:  [98 F (36.7 C)-98.8 F (37.1 C)] 98.4 F (36.9 C) (12/01 0835) Pulse Rate:  [62-70] 67 (12/01 0835) Resp:  [18-21] 20 (12/01 0835) BP: (99-131)/(48-85) 131/72 (12/01 0835) SpO2:  [97 %-100 %] 100 % (12/01 0835) Weight:  [97.4 kg] 97.4 kg (12/01 0537) Last BM Date : 09/09/24  Weight change: Filed Weights   09/07/24 0444 09/08/24 0500 09/09/24 0537  Weight: 97.3 kg 94.5 kg 97.4 kg    Intake/Output:   Intake/Output Summary (Last 24 hours) at 09/09/2024 1001 Last data filed at 09/09/2024 0837 Gross per 24 hour  Intake 160 ml  Output --  Net 160 ml      Physical Exam   General:  Sitting up in bed. No resp difficulty HEENT: normal Neck: supple. no JVD.  Cor: Regular rate & rhythm. No rubs, gallops or murmurs. Lungs: clear Abdomen: soft, nontender, nondistended.Good bowel sounds. Extremities: no cyanosis, clubbing, rash, edema Neuro: alert & orientedx3, cranial nerves grossly intact. moves all 4 extremities w/o difficulty. Affect pleasant  Telemetry   Sinus with CHB 60-70s Personally reviewed   Labs    CBC Recent Labs    09/08/24 0448 09/09/24 0343  WBC 6.5 6.9  HGB 9.5* 8.7*  HCT 28.8* 27.3*  MCV 96.0 97.5  PLT 281 282   Basic Metabolic Panel Recent Labs    88/69/74 0448 09/09/24 0343  NA 138 139  K 4.3 4.0  CL 101 105  CO2 27 30  GLUCOSE 110* 107*  BUN 19 18  CREATININE 1.48* 1.24*  CALCIUM  8.5* 8.4*  MG 2.2 1.9   Liver Function Tests No results for  input(s): AST, ALT, ALKPHOS, BILITOT, PROT, ALBUMIN  in the last 72 hours. No results for input(s): LIPASE, AMYLASE in the last 72 hours. Cardiac Enzymes No results for input(s): CKTOTAL, CKMB, CKMBINDEX, TROPONINI in the last 72 hours.  BNP: BNP (last 3 results) No results for input(s): BNP in the last 8760 hours.  ProBNP (last 3 results) No results for input(s): PROBNP in the last 8760 hours.   D-Dimer No results for input(s): DDIMER in the last 72 hours. Hemoglobin A1C No results for input(s): HGBA1C in the last 72 hours. Fasting Lipid Panel No results for input(s): CHOL, HDL, LDLCALC, TRIG, CHOLHDL, LDLDIRECT in the last 72 hours. Thyroid  Function Tests No results for input(s): TSH, T4TOTAL, T3FREE, THYROIDAB in the last 72 hours.  Invalid input(s): FREET3  Other results:   Imaging    DG Chest 2 View Result Date: 09/09/2024 EXAM: 2 VIEW(S) XRAY OF THE CHEST 09/09/2024 06:21:38 AM COMPARISON: 09/04/2024 CLINICAL HISTORY: Status post cardiac surgery [id number] FINDINGS: LINES, TUBES AND DEVICES: Stable left PICC line. Cardiac valve replacement and left atrial clip again noted. LUNGS AND PLEURA: Stable small right pleural effusion with associated right lung base opacity. No pneumothorax. HEART AND MEDIASTINUM: Stable mild cardiomegaly. BONES AND SOFT TISSUES: Sternotomy wires again noted. IMPRESSION:  1. Stable small right pleural effusion. 2. Right lung base opacity which is favored to represent atelectasis in the setting of pleural effusion. 3. Stable mild cardiomegaly. Electronically signed by: Waddell Calk MD 09/09/2024 07:25 AM EST RP Workstation: [id number]       Medications:     Scheduled Medications:  acetaminophen   500 mg Oral QID   apixaban   5 mg Oral BID   aspirin  EC  81 mg Oral Daily   bisacodyl   10 mg Oral Daily   Or   bisacodyl   10 mg Rectal Daily   Chlorhexidine  Gluconate Cloth  6 each Topical Daily     Cardiac Surgery, Patient & Family Education   Does not apply Once   feeding supplement  237 mL Oral BID BM   lidocaine   1 patch Transdermal Q24H   pantoprazole   40 mg Oral Daily   polyethylene glycol  17 g Oral Daily   senna  1 tablet Oral Daily   sodium chloride  flush  10-40 mL Intracatheter Q12H   sodium chloride  flush  3 mL Intravenous Q12H   sodium chloride  flush  3 mL Intravenous Q12H   spironolactone   25 mg Oral Daily   torsemide   20 mg Oral Daily    Infusions:  sodium chloride      promethazine  (PHENERGAN ) injection (IM or IVPB) Stopped (08/31/24 1154)    PRN Medications: sodium chloride , acetaminophen , albuterol , HYDROmorphone  (DILAUDID ) injection, hydrOXYzine , methocarbamol , ondansetron  (ZOFRAN ) IV, mouth rinse, oxyCODONE , polyethylene glycol, promethazine  (PHENERGAN ) injection (IM or IVPB), simethicone , sodium chloride  flush, sodium chloride  flush, sodium chloride  flush    Assessment/Plan   1. Rheumatic heart disease: Severe MR and severe AI on 9/25 TEE with EF 55-60%. S/p AVR with 21 mm Inspiris valve, MVR with 27 mm Mitris valve and LAA ligation by Dr. Louden Houseworth on 08/26/24. Post-op echo (11/20) showed LV EF 65%, mild RV dilation with normal systolic function but there is likely severe tricuspid regurgitation, s/p bioprosthetic MV replacement with mean gradient 4, s/p bioprosthetic AV replacement with mean gradient 20.   - Echo 11/29 EF 60% RV ok. Mean AVG 14. Mild TR 2. Post-op cardiogenic shock/RV failure:  Echo post-op on 11/20 showed LV EF 65%, mild RV dilation with normal systolic function but there is likely severe tricuspid regurgitation, s/p bioprosthetic MV replacement with mean gradient 4, s/p bioprosthetic AV replacement with mean gradient 20.  - Co-ox 55% off  milrinone  (stopped 11/29) - VOlume status much improved. On torsemide  20 daily - Continue Spiro 25 mg daily  3. Atrial flutter: Atypical, noted post-op. - s/p DC-CV 11/28. -> sinus with CHB -  Continue Eliquis  5 mg bid  - Amio stopped 11/30 with CHB - Have consulted EP 4. CHB - will stop amio - EP consulted as above 4. AKI: - Scr up slightly today 1.15 -> 1.48 - hold torsemide   5. Anemia: Post-op.  Transfuse hgb < 8. 8.7today  6. Hypokalemia - K 4.0 - Continue spiro. Supp K as needed 7. Post-op ileus - Resolved    Length of Stay: 14  Toribio Fuel, MD  09/09/2024, 10:01 AM  Advanced Heart Failure Team Pager [phone] (M-F; 7a - 5p)  Please contact [facility] for night-coverage after hours (5p -7a ) and weekends on [email or web address]

## 2024-09-09 NOTE — Progress Notes (Signed)
 Physical Therapy Treatment Patient Details Name: Katie Jacobson MRN: [id number] DOB: [birth year] Today's Date: 09/09/2024   History of Present Illness Pt is 68 yo presenting to [facility] on 11/17 for scheduled AVR/MVR. PMH: breast cancer (triple negative), rheumatic valve disease, severe AI, sever mitral regurgitation, HTN, HLD    PT Comments  Pt received in recliner, agreeable to therapy session, requesting to remain in room after stair negotiation via 7 platform step due to fatigue. Pt needing up to minA +2 for safety with stepping up/down and mostly Supervision for gait for short household distances in room without AD, with x1 seated break due to fatigue and DOE 2/4. Pt reports modified RPE 5/10 (fatigue) at end of session. Pt continues to benefit from PT services to progress toward functional mobility goals, continue to recommend HHPT upon DC.     If plan is discharge home, recommend the following: Assist for transportation;Help with stairs or ramp for entrance;Assistance with cooking/housework;A little help with walking and/or transfers;A little help with bathing/dressing/bathroom   Can travel by private vehicle        Equipment Recommendations  Rolling walker (2 wheels);BSC/3in1 (vs rollator pending progress)    Recommendations for Other Services       Precautions / Restrictions Precautions Precautions: Sternal Precaution Booklet Issued: No Recall of Precautions/Restrictions: Intact Precaution/Restrictions Comments: min cues for precs with stairs Restrictions Weight Bearing Restrictions Per Provider Order: Yes Other Position/Activity Restrictions: cardiac sternal precautions     Mobility  Bed Mobility Overal bed mobility: Needs Assistance             General bed mobility comments: received in recliner, pt requesting to remain in chair at end of session    Transfers Overall transfer level: Needs assistance Equipment used: None Transfers: Sit to/from Stand Sit to  Stand: Modified independent (Device/Increase time)           General transfer comment: pt keeping hands on her lap    Ambulation/Gait Ambulation/Gait assistance: Supervision Gait Distance (Feet): 20 Feet (45ft, seated break, 71ft) Assistive device: None Gait Pattern/deviations: Step-through pattern, Decreased stride length, Wide base of support Gait velocity: decreased     General Gait Details: Cues for Move in the Tube precs when holding RW, but pt mostly not using AD due to short distance in room and pt defers needing AD for short distances; seated break to rest after stairs then pt defers hallway distance ambulation as she already walked 2-3 x in hallway today.   Stairs Stairs: Yes Stairs assistance: Min assist, +2 safety/equipment Stair Management: Step to pattern, Backwards, Forwards, With walker Number of Stairs: 3 General stair comments: 7 platform step in room x3 reps, BUE supported by RW, minA to CGA at R knee for stability as pt reports concern for imbalance, but no buckling observed.   Wheelchair Mobility     Tilt Bed    Modified Rankin (Stroke Patients Only)       Balance Overall balance assessment: Needs assistance Sitting-balance support: Feet supported, No upper extremity supported Sitting balance-Leahy Scale: Good     Standing balance support: No upper extremity supported Standing balance-Leahy Scale: Fair Standing balance comment: stands statically without UE support.                            Communication Communication Communication: No apparent difficulties  Cognition Arousal: Alert Behavior During Therapy: WFL for tasks assessed/performed   PT - Cognitive impairments: No apparent impairments  PT - Cognition Comments: tangential, cooperative Following commands: Intact      Cueing Cueing Techniques: Verbal cues, Gestural cues  Exercises      General Comments General comments (skin  integrity, edema, etc.): Poor signal on pulse ox, she would benefit from portable pulse ox assessment next session with new sensor placed      Pertinent Vitals/Pain Pain Assessment Pain Assessment: No/denies pain Pain Intervention(s): Monitored during session, Repositioned    Home Living                          Prior Function            PT Goals (current goals can now be found in the care plan section) Acute Rehab PT Goals Patient Stated Goal: to improve mobility/decrease pain PT Goal Formulation: With patient Time For Goal Achievement: 09/11/24 Progress towards PT goals: Progressing toward goals    Frequency    Min 2X/week      PT Plan      Co-evaluation              AM-PAC PT 6 Clicks Mobility   Outcome Measure  Help needed turning from your back to your side while in a flat bed without using bedrails?: A Little Help needed moving from lying on your back to sitting on the side of a flat bed without using bedrails?: A Little Help needed moving to and from a bed to a chair (including a wheelchair)?: A Little Help needed standing up from a chair using your arms (e.g., wheelchair or bedside chair)?: A Little Help needed to walk in hospital room?: A Little Help needed climbing 3-5 steps with a railing? : A Lot (+2 safety) 6 Click Score: 17    End of Session Equipment Utilized During Treatment: Gait belt Activity Tolerance: Patient tolerated treatment well;Patient limited by fatigue Patient left: in chair;with call bell/phone within reach Nurse Communication: Mobility status PT Visit Diagnosis: Unsteadiness on feet (R26.81);Other abnormalities of gait and mobility (R26.89);Muscle weakness (generalized) (M62.81);Pain;Difficulty in walking, not elsewhere classified (R26.2)     Time: 8297-8285 PT Time Calculation (min) (ACUTE ONLY): 12 min  Charges:    $Gait Training: 8-22 mins PT General Charges $$ ACUTE PT VISIT: 1 Visit                      Nerea Bordenave P., PTA Acute Rehabilitation Services Secure Chat Preferred 9a-5:30pm Office: [phone]    Connell HERO [facility] 09/09/2024, 5:37 PM

## 2024-09-09 NOTE — Progress Notes (Addendum)
 [address] [place] [employer] [address]             [phone]  14 Days Post-Op Procedure(s) (LRB): REPLACEMENT, AORTIC VALVE, OPEN, UTILIZING INSPIRIS RESILIA  AORTIC VALVE (N/A) REPLACEMENT, MITRAL VALVE, UTILIZING MITRIS RESILIA MITRAL VALVE (N/A) CLIPPING, LEFT ATRIAL APPENDAGE UTILIZING ATRICURE PRO 40 (N/A) ECHOCARDIOGRAM, TRANSESOPHAGEAL, INTRAOPERATIVE (N/A) Subjective: Transferred from ICU to 4E yesterday.  Sitting up in the bedside chair using her laptop.  Said she is feeling good in general but is anxious about going back into atrial flutter.   CoOx 54.6    Objective: Vital signs in last 24 hours: Temp:  [97.8 F (36.6 C)-98.8 F (37.1 C)] 98 F (36.7 C) (12/01 0336) Pulse Rate:  [62-72] 62 (12/01 0336) Cardiac Rhythm: Atrial flutter (11/30 1900) Resp:  [17-24] 19 (12/01 0336) BP: (99-125)/(48-90) 123/63 (12/01 0336) SpO2:  [97 %-100 %] 98 % (12/01 0336) Weight:  [97.4 kg] 97.4 kg (12/01 0537)  Hemodynamic parameters for last 24 hours: CVP:  [9 mmHg-10 mmHg] 9 mmHg  Intake/Output from previous day: 11/30 0701 - 12/01 0700 In: 150 [P.O.:120; I.V.:30] Out: -  Intake/Output this shift: No intake/output data recorded.  General appearance: alert, cooperative, and no distress Neurologic: intact  Heart: Atrial flutter, VR 65-70/min Lungs: normal work of breathing on RA, breath sounds clear.  Abdomen: soft, no tenderness Extremities: Has Unna boots to both LE's, toes warm and have mild swelling. Wound: the sternotomy incision is intact and healing well, pacer wires ore out, sutures at chest tube sites are in place.   Lab Results: Recent Labs    09/08/24 0448 09/09/24 0343  WBC 6.5 6.9  HGB 9.5* 8.7*  HCT 28.8* 27.3*  PLT 281 282   BMET:  Recent Labs    09/08/24 0448 09/09/24 0343  NA 138 139  K 4.3 4.0  CL 101 105  CO2 27 30  GLUCOSE 110* 107*  BUN 19 18  CREATININE 1.48* 1.24*  CALCIUM  8.5* 8.4*    PT/INR: No  results for input(s): LABPROT, INR in the last 72 hours. ABG    Component Value Date/Time   PHART 7.415 08/29/2024 0105   HCO3 22.7 08/29/2024 0105   TCO2 24 08/29/2024 0105   ACIDBASEDEF 2.0 08/29/2024 0105   O2SAT 54.6 09/09/2024 0343   CBG (last 3)  No results for input(s): GLUCAP in the last 72 hours.  Assessment/Plan: S/P Procedure(s) (LRB): REPLACEMENT, AORTIC VALVE, OPEN, UTILIZING INSPIRIS RESILIA  AORTIC VALVE (N/A) REPLACEMENT, MITRAL VALVE, UTILIZING MITRIS RESILIA MITRAL VALVE (N/A) CLIPPING, LEFT ATRIAL APPENDAGE UTILIZING ATRICURE PRO 40 (N/A) ECHOCARDIOGRAM, TRANSESOPHAGEAL, INTRAOPERATIVE (N/A)  -POD14 bioprosthetic AVR and MVR for severe rheumatic AI, MR with left atrial appendage ligation. Post-op cardiogenic shock, off milrinone  >48hours with stable CoOx. On ASA, spiro. D/C sutures at chest tube sites.   -Post-op atrial fibrillation / flutter. S/P successful DCCV on 11/28 but now back in a-flutter. K+ 4.0, Mg++ 1.9.  On Eliquis , amiodarone  on hold for CHB yesterday. Consult EP.  -PULM- good oxygenation on RA.  Small right effusion on CXR, stable. Lung fields otherwise clear.   -GI-post-op ileus resolved. Appetite improving, having bowel function.   -RENAL- AKI post-op.  Diuretic held yesterday, creat trending down and has some mild swelling in her toes. Resuming torsemide  today. D/C PRN ketorolac  and monitor.   -HEME- expected ABL anemia.  Hct stable, monitoring.   -Disposition- PT now recommending HH PT at discharge with  rolling walker and BSC.  Not ready for discharge until rhythm issues resolved and also needs more work on mobility.    LOS: 14 days    Laurel JUDITHANN Becket, PA-C 09/09/2024

## 2024-09-09 NOTE — Progress Notes (Signed)
 CARDIAC REHAB PHASE I   PRE:  Rate/Rhythm: 66 aflutter    BP: sitting 114/72    SpO2: 100 RA  MODE:  Ambulation: 330 ft   POST:  Rate/Rhythm: 84 aflutter    BP: sitting 130/71     SpO2: 99 RA   Pt stood without assist and ambulated with RW. Steady, only standby assist. Pt c/o feeling fatigued, especially in her legs today. She is surprised by this, sts she wasn't feeling so tired the last couple of days. Also c/o SOB. Rest x2. Return to recliner.  Encouraged IS and x2 more walks. [id number]  Aliene Aris BS, ACSM-CEP 09/09/2024 10:06 AM

## 2024-09-09 NOTE — Progress Notes (Signed)
 Orthopedic Tech Progress Note Patient Details:  Katie Jacobson Mar 09, 1956 [id number]  Ortho Devices Type of Ortho Device: Ace wrap, Unna boot Ortho Device/Splint Location: BLE Ortho Device/Splint Interventions: Removal, Ordered, Application   Post Interventions Patient Tolerated: Well Instructions Provided: Care of device  Delanna LITTIE Pac 09/09/2024, 2:26 PM

## 2024-09-09 NOTE — Progress Notes (Signed)
 Mobility Specialist Progress Note;   09/09/24 1217  Mobility  Activity Ambulated with assistance  Level of Assistance Standby assist, set-up cues, supervision of patient - no hands on  Assistive Device Front wheel walker  Distance Ambulated (ft) 375 ft  Activity Response Tolerated well  Mobility Referral Yes  Mobility visit 1 Mobility  Mobility Specialist Start Time (ACUTE ONLY) 1217  Mobility Specialist Stop Time (ACUTE ONLY) 1230  Mobility Specialist Time Calculation (min) (ACUTE ONLY) 13 min   Pt in chair upon arrival, agreeable to mobility. Required no physical assistance during ambulation, SV for safety. Took 1x standing rest break d/t fatigued BLE. HR 76-80 bpm throughout. Pt returned back to chair and left with all needs met, call bell in reach.   Lauraine Erm Mobility Specialist Please contact via SecureChat or [employer] [phone]

## 2024-09-09 NOTE — Progress Notes (Signed)
 Occupational Therapy Treatment Patient Details Name: Katie Jacobson MRN: [id number] DOB: [birth year] Today's Date: 09/09/2024   History of present illness Pt is 68 yo presenting to [facility] on 11/17 for scheduled AVR/MVR. PMH: breast cancer (triple negative), rheumatic valve disease, severe AI, sever mitral regurgitation, HTN, HLD   OT comments  Pt making good progress with functional goals. Pt reports that she feels better. Pt performing functional mobility without use of RW to bathroom for toilet transfers with Sup, toileitng tasks and LB ADLs with CGA/Sup, grooming/hygiene standing at sink with set up/sp. Reviewed energy conservation strategies with pt with good recall and carryover. OT will continue to follow acutely to maximize level of function and safety      If plan is discharge home, recommend the following:  Assistance with cooking/housework;A little help with bathing/dressing/bathroom;A little help with walking and/or transfers   Equipment Recommendations  Other (comment) (defer)    Recommendations for Other Services      Precautions / Restrictions Precautions Precautions: Sternal Precaution Booklet Issued: No Restrictions Weight Bearing Restrictions Per Provider Order: Yes RUE Weight Bearing Per Provider Order: Non weight bearing LUE Weight Bearing Per Provider Order: Non weight bearing Other Position/Activity Restrictions: sternal precautions       Mobility Bed Mobility               General bed mobility comments: in recliner    Transfers Overall transfer level: Needs assistance Equipment used: Rolling walker (2 wheels) Transfers: Sit to/from Stand Sit to Stand: Supervision           General transfer comment: Sup for safety     Balance Overall balance assessment: Needs assistance Sitting-balance support: Feet supported, No upper extremity supported Sitting balance-Leahy Scale: Good     Standing balance support: No upper extremity  supported Standing balance-Leahy Scale: Fair                             ADL either performed or assessed with clinical judgement   ADL Overall ADL's : Needs assistance/impaired     Grooming: Wash/dry hands;Wash/dry face;Oral care;Supervision/safety;Standing       Lower Body Bathing: Contact guard assist;Supervison/ safety;Sitting/lateral leans;Sit to/from stand   Upper Body Dressing : Set up;Standing   Lower Body Dressing: Maximal assistance;Sitting/lateral leans Lower Body Dressing Details (indicate cue type and reason): to don socks Toilet Transfer: Supervision/safety;Regular Toilet;Ambulation   Toileting- Clothing Manipulation and Hygiene: Supervision/safety;Sit to/from stand       Functional mobility during ADLs: Supervision/safety;Cueing for safety General ADL Comments: reviewed EC techiques    Extremity/Trunk Assessment Upper Extremity Assessment Upper Extremity Assessment: Generalized weakness   Lower Extremity Assessment Lower Extremity Assessment: Defer to PT evaluation        Vision Ability to See in Adequate Light: 0 Adequate Patient Visual Report: No change from baseline     Perception     Praxis     Communication Communication Communication: No apparent difficulties   Cognition Arousal: Alert Behavior During Therapy: WFL for tasks assessed/performed Cognition: No apparent impairments                               Following commands: Intact        Cueing   Cueing Techniques: Verbal cues  Exercises      Shoulder Instructions       General Comments      Pertinent Vitals/ Pain  Pain Assessment Pain Assessment: No/denies pain Pain Intervention(s): Monitored during session  Home Living                                          Prior Functioning/Environment              Frequency  Min 2X/week        Progress Toward Goals  OT Goals(current goals can now be found in the care  plan section)  Progress towards OT goals: Progressing toward goals     Plan      Co-evaluation                 AM-PAC OT 6 Clicks Daily Activity     Outcome Measure   Help from another person eating meals?: None Help from another person taking care of personal grooming?: A Little Help from another person toileting, which includes using toliet, bedpan, or urinal?: A Little Help from another person bathing (including washing, rinsing, drying)?: A Little Help from another person to put on and taking off regular upper body clothing?: A Little Help from another person to put on and taking off regular lower body clothing?: A Little 6 Click Score: 19    End of Session Equipment Utilized During Treatment: Gait belt  OT Visit Diagnosis: Unsteadiness on feet (R26.81);Other abnormalities of gait and mobility (R26.89);Muscle weakness (generalized) (M62.81)   Activity Tolerance Patient tolerated treatment well   Patient Left with call bell/phone within reach;in chair   Nurse Communication Mobility status        Time: 8663-8640 OT Time Calculation (min): 23 min  Charges: OT General Charges $OT Visit: 1 Visit OT Treatments $Self Care/Home Management : 8-22 mins $Therapeutic Activity: 8-22 mins    Jacques Karna Loose 09/09/2024, 2:32 PM

## 2024-09-09 NOTE — Consult Note (Signed)
 Cardiology Consultation   Patient ID: Katie Jacobson MRN: [id number]; DOB: [birth year]  Admit date: 08/26/2024 Date of Consult: 09/09/2024  PCP:  Daryl Setter, NP   [facility] Providers Cardiologist:  Alm Clay, MD     Patient Profile: Katie Jacobson is a 68 y.o. female with a hx of  Breast Ca (R lumpectomy, chemo/XRT 2016 >>> 2024 b/l breast CA > left lumpectomy and R mastectomy, chemo and photon tx b/l) Smoker, HTN Rheumatic heart disease    who is being seen 09/09/2024 for the evaluation of post op CHB at the request of Dr. Cherrie.  History of Present Illness: Katie Jacobson was admitted 08/26/24 for her planned MVR/AVR Median Sternotomy Extracorporeal circulation Femoral venous cannulation 4.   Aortic valve replacement using a 21 mm Inspiris valve 5.   Mitral valve replacement using a 27 mm Mitris valve 6.   Left atrial appendage ligation  TEE post-bypass with normal functioning prosthetic valves and moderately reduced LV. Became hypotensive after chest was closed with drop in cardiac function.  Epinephrine  was started  Milrinone  added 11/19  AHF team brought on board 08/29/24 with declining urine OP and worsening hypoxia, cardiogenic shock Concerns of severe TR  Post-op echo showed LV EF 65%, mild RV dilation with normal systolic function but there is likely severe tricuspid regurgitation, s/p bioprosthetic MV replacement with mean gradient 4, s/p bioprosthetic AV replacement with mean gradient 20.    11/21: developed AFlutter post op and started on amiodarone /heparin   1/22/ post op ileus brief  11/24: described rate controlled AFlutter 11/25, ambulated some, planned for DCCV once euvolemic 11/26 > off Epi gtt  11/28 > DCCV > SR 11/29 tele described junctional (faster then her sinus rate) Off milrinone  11/30: tele reported as Sinus w/CHB, feeling well, ambulating Echo 11/29 EF 60% RV ok. Mean AVG 14. Mild TR  Amio stopped Moved to  4E  She is OOB to chair, no CP, no SOB, no cardiac awareness  Past Medical History:  Diagnosis Date   Allergy    Arthritis    Breast cancer of upper-outer quadrant of right female breast (HCC) 11/14/2014   Treated with chemotherapy and radiation (diagnosed again in March 2024 in both breasts)   Heart murmur    History of chemotherapy    finished chemo 02/03/2015   History of kidney stones    History of seizures    as a child - unknown cause - states was never on anticonvulsants   Hypertension    states under control with meds., has been on med. x years   Mitral regurgitation    Mitral valve prolapse 1990   Rheumatic mitral valve with moderate to severe MR by echo 05/31/2024 => TEE suggests rheumatic mitral valve with severe calcified leaflets and severe mitral regurgitation but also severe aortic regurgitation   Osteoporosis    Personal history of chemotherapy    Personal history of radiation therapy    Port-A-Cath in place 01/24/2023   Seasonal allergies     Past Surgical History:  Procedure Laterality Date   ABDOMINAL HYSTERECTOMY  1998   partial   AORTIC VALVE REPLACEMENT N/A 08/26/2024   Procedure: REPLACEMENT, AORTIC VALVE, OPEN, UTILIZING INSPIRIS RESILIA  AORTIC VALVE;  Surgeon: Daniel Con RAMAN, MD;  Location: [facility] OR;  Service: Open Heart Surgery;  Laterality: N/A;   APPENDECTOMY  1976   BREAST BIOPSY Right 09/30/2022   US  RT BREAST BX W LOC DEV 1ST LESION IMG BX SPEC US  GUIDE  09/30/2022 [facility]   BREAST BIOPSY Left 09/30/2022   US  LT BREAST BX W LOC DEV 1ST LESION IMG BX SPEC US  GUIDE 09/30/2022 [facility]   BREAST BIOPSY Right 09/30/2022   US  RT BREAST BX W LOC DEV EA ADD LESION IMG BX SPEC US  GUIDE 09/30/2022 [facility]   BREAST BIOPSY  12/14/2022   MM LT RADIOACTIVE SEED LOC MAMMO GUIDE 12/14/2022 [facility]   BREAST LUMPECTOMY Right 02/2015   BREAST LUMPECTOMY WITH RADIOACTIVE SEED AND SENTINEL LYMPH NODE BIOPSY Left 12/15/2022    Procedure: LEFT BREAST LUMPECTOMY WITH RADIOACTIVE SEED AND AXILLARY SENTINEL LYMPH NODE BIOPSY;  Surgeon: Ebbie Cough, MD;  Location: [facility];  Service: General;  Laterality: Left;   CLIPPING OF ATRIAL APPENDAGE N/A 08/26/2024   Procedure: CLIPPING, LEFT ATRIAL APPENDAGE UTILIZING ATRICURE PRO 40;  Surgeon: Daniel Con RAMAN, MD;  Location: [facility];  Service: Open Heart Surgery;  Laterality: N/A;   COLONOSCOPY  09/30/2016   COMPLEX WOUND CLOSURE N/A 07/18/2023   Procedure: CLOSURE CHEST WALL WOUND X2;  Surgeon: Ebbie Cough, MD;  Location: [facility] OR;  Service: General;  Laterality: N/A;   INTRAOPERATIVE TRANSESOPHAGEAL ECHOCARDIOGRAM N/A 08/26/2024   Procedure: ECHOCARDIOGRAM, TRANSESOPHAGEAL, INTRAOPERATIVE;  Surgeon: Daniel Con RAMAN, MD;  Location: [facility] OR;  Service: Open Heart Surgery;  Laterality: N/A;   KNEE ARTHROSCOPY Left 03/26/2008   MASTECTOMY W/ SENTINEL NODE BIOPSY Right 12/15/2022   Procedure: RIGHT MASTECTOMY WITH RIGHT AXILLARY SENTINEL LYMPH NODE BIOPSY;  Surgeon: Ebbie Cough, MD;  Location: [facility];  Service: General;  Laterality: Right;  180 MIN ROOM 9   MITRAL VALVE REPLACEMENT N/A 08/26/2024   Procedure: REPLACEMENT, MITRAL VALVE, UTILIZING MITRIS RESILIA MITRAL VALVE;  Surgeon: Daniel Con RAMAN, MD;  Location: [facility];  Service: Open Heart Surgery;  Laterality: N/A;   PLANTAR'S WART EXCISION Left    x 2   PORT-A-CATH REMOVAL Left 04/06/2015   Procedure: REMOVAL PORT-A-CATH;  Surgeon: Cough Ebbie, MD;  Location: [facility];  Service: General;  Laterality: Left;   PORT-A-CATH REMOVAL N/A 07/18/2023   Procedure: REMOVAL PORT-A-CATH;  Surgeon: Ebbie Cough, MD;  Location: [facility] OR;  Service: General;  Laterality: N/A;   PORTACATH PLACEMENT N/A 11/27/2014   Procedure: INSERTION PORT-A-CATH;  Surgeon: Cough Ebbie, MD;  Location: [facility];  Service: General;  Laterality: N/A;   PORTACATH PLACEMENT Right 12/15/2022   Procedure:  INSERTION PORT-A-CATH;  Surgeon: Ebbie Cough, MD;  Location: [facility] OR;  Service: General;  Laterality: Right;   RADIOACTIVE SEED GUIDED PARTIAL MASTECTOMY WITH AXILLARY SENTINEL LYMPH NODE BIOPSY Right 03/02/2015   Procedure: RADIOACTIVE SEED GUIDED RIGHT PARTIAL MASTECTOMY WITH RIGHT AXILLARY SENTINEL LYMPH NODE BIOPSY;  Surgeon: Cough Ebbie, MD;  Location: [facility];  Service: General;  Laterality: Right;   RIGHT/LEFT HEART CATH AND CORONARY ANGIOGRAPHY N/A 07/16/2024   Procedure: RIGHT/LEFT HEART CATH AND CORONARY ANGIOGRAPHY;  Surgeon: Anner Alm ORN, MD;  Location: [facility] INVASIVE CV LAB;  Service: Cardiovascular;  Laterality: N/A;   SALIVARY STONE REMOVAL Right 1972   TONSILLECTOMY  1970s   TRANSESOPHAGEAL ECHOCARDIOGRAM (CATH LAB) N/A 06/21/2024   Procedure: TRANSESOPHAGEAL ECHOCARDIOGRAM;  Surgeon: Barbaraann Darryle Ned, MD;  Location: [facility] INVASIVE CV LAB;  Service: Cardiovascular;  Laterality: N/A;   TRANSTHORACIC ECHOCARDIOGRAM  01/16/2020   EF 60 to 65%.  GRII DD.  No R WMA.  Normal RV size and function.  Rheumatic mitral valve with moderate thickening-hockey-stick appearing.  Moderate MR with mild MS.  Moderate LA  dilation.  (Recommend follow-up in 2 to 3 years)   TRANSTHORACIC ECHOCARDIOGRAM  05/31/2022   Normal LV size and function-EF 60 to 65%.  No RWMA. ??  Normal diastolic parameters but elevated LAP?  Normal RV with normal RVP and RAP.  Manage mitral valve with mild MR and no MS.  Mild to moderate AI with aortic sclerosis but no stenosis.   TUBAL LIGATION  1996     Home Medications:  Prior to Admission medications   Medication Sig Start Date End Date Taking? Authorizing Provider  aspirin  81 MG tablet Take 1 tablet (81 mg total) by mouth daily. 04/21/15  Yes Magrinat, Sandria BROCKS, MD  chlorthalidone  (HYGROTON ) 25 MG tablet TAKE 1 TABLET(25 MG) BY MOUTH EVERY OTHER DAY Patient taking differently: Take 25 mg by mouth every Monday, Wednesday, and Friday. 04/03/24   Yes O'Sullivan, Melissa, NP  enalapril  (VASOTEC ) 20 MG tablet Take 1 tablet (20 mg total) by mouth daily. Patient taking differently: Take 20 mg by mouth at bedtime. 04/03/24  Yes Daryl Setter, NP  metoprolol  succinate (TOPROL -XL) 100 MG 24 hr tablet Take 1 tablet (100 mg total) by mouth daily. TAKE WITH OR IMMEDIATELY FOLLOWING A MEAL. Patient taking differently: Take 100 mg by mouth at bedtime. TAKE WITH OR IMMEDIATELY FOLLOWING A MEAL. 04/03/24  Yes Daryl Setter, NP  albuterol  (VENTOLIN  HFA) 108 (90 Base) MCG/ACT inhaler Inhale 1-2 puffs into the lungs every 6 (six) hours as needed for wheezing or shortness of breath. 05/23/22   O'Sullivan, Melissa, NP  fluticasone  (FLONASE ) 50 MCG/ACT nasal spray Place 1 spray into both nostrils as needed for allergies or rhinitis.    [provider]    Scheduled Meds:  acetaminophen   500 mg Oral QID   apixaban   5 mg Oral BID   aspirin  EC  81 mg Oral Daily   bisacodyl   10 mg Oral Daily   Or   bisacodyl   10 mg Rectal Daily   Chlorhexidine  Gluconate Cloth  6 each Topical Daily   [facility], Patient & Family Education   Does not apply Once   feeding supplement  237 mL Oral BID BM   lidocaine   1 patch Transdermal Q24H   pantoprazole   40 mg Oral Daily   polyethylene glycol  17 g Oral Daily   senna  1 tablet Oral Daily   sodium chloride  flush  10-40 mL Intracatheter Q12H   sodium chloride  flush  3 mL Intravenous Q12H   sodium chloride  flush  3 mL Intravenous Q12H   spironolactone   25 mg Oral Daily   torsemide   20 mg Oral Daily   Continuous Infusions:  sodium chloride      promethazine  (PHENERGAN ) injection (IM or IVPB) Stopped (08/31/24 1154)   PRN Meds: sodium chloride , acetaminophen , albuterol , HYDROmorphone  (DILAUDID ) injection, hydrOXYzine , methocarbamol , ondansetron  (ZOFRAN ) IV, mouth rinse, oxyCODONE , polyethylene glycol, promethazine  (PHENERGAN ) injection (IM or IVPB), simethicone , sodium chloride  flush,  sodium chloride  flush, sodium chloride  flush  Allergies:    Allergies  Allergen Reactions   Sulfa Antibiotics Other (See Comments)    UNKNOWN    Social History:   Social History   Socioeconomic History   Marital status: Married    Spouse name: Not on file   Number of children: 1   Years of education: Not on file   Highest education level: Not on file  Occupational History   Not on file  Tobacco Use   Smoking status: Some Days    Current packs/day: 0.50  Average packs/day: 0.5 packs/day for 43.0 years (21.5 ttl pk-yrs)    Types: Cigarettes   Smokeless tobacco: Never   Tobacco comments:    08/22/24 - one pack of cigarettes every couple of days  Vaping Use   Vaping status: Never Used  Substance and Sexual Activity   Alcohol use: Not Currently    Comment: occ    Drug use: No   Sexual activity: Yes    Partners: Male  Other Topics Concern   Not on file  Social History Narrative   Married with 2 daughters- born 6 and 34 ([name] died in MVA 10-12-16)   Previously worked -- therapist, 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  Has been doing part time work with [employer] -- had 6 active clients, but the most important one is her Daughter.   Regular exercise:  No   Caffeine Use: none; Current 1ppd smoker x > 39 yrs; EtOH ~4 oz/week   Social Drivers of Corporate Investment Banker Strain: Not on file  Food Insecurity: Unknown (08/28/2024)   Hunger Vital Sign    Worried About Running Out of Food in the Last Year: Patient unable to answer    Ran Out of Food in the Last Year: Not on file  Transportation Needs: No Transportation Needs (08/29/2024)   PRAPARE - Administrator, Civil Service (Medical): No    Lack of Transportation (Non-Medical): No  Physical Activity: Not on file  Stress: Not on file  Social Connections: Unknown (08/28/2024)   Social Connection and Isolation Panel    Frequency of Communication with Friends and Family: Never     Frequency of Social Gatherings with Friends and Family: Never    Attends Religious Services: Never    Database Administrator or Organizations: Yes    Attends Banker Meetings: 1 to 4 times per year    Marital Status: Patient declined  Intimate Partner Violence: Not At Risk (08/28/2024)   Humiliation, Afraid, Rape, and Kick questionnaire    Fear of Current or Ex-Partner: No    Emotionally Abused: No    Physically Abused: No    Sexually Abused: No    Family History:   Family History  Problem Relation Age of Onset   Heart disease Mother    Stroke Mother    Hypertension Mother    Diabetes Mother    Heart attack Father    Diabetes Sister    Diabetes Sister    Kidney disease Brother    Mental retardation Brother    Colon polyps Daughter    Cancer Cousin    Breast cancer Cousin        deceased 46   Colon cancer Neg Hx    Esophageal cancer Neg Hx    Rectal cancer Neg Hx    Stomach cancer Neg Hx      ROS:  Please see the history of present illness.  All other ROS reviewed and negative.     Physical Exam/Data: Vitals:   09/08/24 2321 09/09/24 0336 09/09/24 0537 09/09/24 0835  BP: (!) 99/48 123/63  131/72  Pulse: 64 62  67  Resp: 20 19  20   Temp: 98.2 F (36.8 C) 98 F (36.7 C)  98.4 F (36.9 C)  TempSrc: Oral Oral  Oral  SpO2: 99% 98%  100%  Weight:   97.4 kg   Height:        Intake/Output Summary (Last 24 hours) at  09/09/2024 1000 Last data filed at 09/09/2024 9162 Gross per 24 hour  Intake 160 ml  Output --  Net 160 ml      09/09/2024    5:37 AM 09/08/2024    5:00 AM 09/07/2024    4:44 AM  Last 3 Weights  Weight (lbs) 214 lb 11.7 oz 208 lb 5.4 oz 214 lb 8.1 oz  Weight (kg) 97.4 kg 94.5 kg 97.3 kg     Body mass index is 40.57 kg/m.  General:  Well nourished, well developed, in no acute physical distress, though emotional HEENT: normal Neck: no JVD Vascular: No carotid bruits; Distal pulses 2+ bilaterally Cardiac:  RRR; no murmurs, gallops  or rubs Lungs:  CTA b/l, no wheezing, rhonchi or rales  Abd: soft, nontender Ext: unna boots b/l Musculoskeletal:  No deformities Skin: warm and dry  Neuro:  no focal abnormalities noted Psych:  Normal affect   EKG:  The EKG was personally reviewed and demonstrates:    11/17: CHB 65 bpm, new LBBB 11/18 : AV paced ...  SR 71bpm, 1st degree AVBlock , icRBBB SR 80bpm, 1st degree AVBlock , LBBB ? Junctional, same LBBB vs SR w/long 1st degree ~ , IVCD 11/24: AFlutter 84bpm, , narrow QRS 90ms 11/29: Sinus w/CHB 66bpm, narrow QRS 78ms  Pre-op 08/22/24 SR 61bpm  Telemetry:  Telemetry was personally reviewed and demonstrates:   Intermittent loss of signal V rates 60's-70's, appears CHB, yesterday 18:49 > back in rate controlled AFlutter  Relevant CV Studies:  09/07/24: TTE 1. Left ventricular ejection fraction, by estimation, is 60 to 65%. The  left ventricle has normal function. The left ventricle has no regional  wall motion abnormalities. Left ventricular diastolic function could not  be evaluated.   2. Right ventricular systolic function is normal. The right ventricular  size is normal. There is normal pulmonary artery systolic pressure. The  estimated right ventricular systolic pressure is 31.3 mmHg.   3. The mitral valve has been repaired/replaced. Trivial mitral valve  regurgitation. No evidence of mitral stenosis. The mean mitral valve  gradient is 3.0 mmHg. There is a Mitris Resila present in the mitral  position.   4. The aortic valve has been repaired/replaced. Aortic valve  regurgitation is not visualized. No aortic stenosis is present. There is a  21 mm Insiris Resila valve present in the aortic position. Aortic valve  mean gradient measures 14.0 mmHg. Aortic  valve Vmax measures 2.45 m/s.   5. The inferior vena cava is dilated in size with <50% respiratory  variability, suggesting right atrial pressure of 15 mmHg.   6. Compared to study dated  08/29/2024, the mean AVG has decreased from  to , Vmax has decreased from 3.50m/s to 2.53m/s and DI has  increased from 0.5 to 0.8. There is a normal functioning bioprosthetic MVR  present with no significant change  from prior study. PA paressure has normalized. RAP remains elevated at  .    CARDIAC CATHETERIZATION 07/16/2024   Conclusion Table formatting from the original result was not included. Images from the original result were not included.     Prox LAD to Mid LAD lesion is 50% stenosed.   Hemodynamic findings consistent with mild pulmonary hypertension.  PAP-mean 48/16-29 mmHg with PCWP of 22 to 24 mmHg   There is no aortic valve stenosis.-Unable to fully assess for AI.   There is severe MR by Echo/TEE   In the absence of any other complications or medical issues, we expect  the patient to be ready for discharge from a cath perspective on 07/16/2024.   She will proceed with planned T CTS consultation for mitral and aortic valve disease.   Recommend Aspirin  81mg  daily for moderate CAD.   Dominance: Left Angiographically moderate single-vessel disease with segmental 50% calcified stenosis in the mid LAD from SP1-D2 Mild Pulmonary Hypertension with mean PAP 29 mL of mercury and PCWP of 22 to 24 mmHg -> WHO Class II (due to valvular disease) Due to Valvular Disease, Cardiac Output and Index are mild to moderately reduced.   RECOMMENDATONS   In the absence of any other complications or medical issues, we expect the patient to be ready for discharge from a cath perspective on 07/16/2024.   She will proceed with planned TCTS consultation for mitral and aortic valve disease.   Recommend Aspirin  81mg  daily for moderate CAD.    05/31/24: TTE IMPRESSIONS 1. Moderate to severe mitral regurgitation is now present. No stenosis. Would consider TEE for quantification of MR. The mitral valve is rheumatic. Moderate to severe mitral valve regurgitation. The mean mitral valve  gradient is 2.5 mmHg with average heart rate of 63 bpm. 2. Left ventricular ejection fraction, by estimation, is 55 to 60%. The left ventricle has normal function. The left ventricle has no regional wall motion abnormalities. Left ventricular diastolic function could not be evaluated. 3. Right ventricular systolic function is normal. The right ventricular size is mildly enlarged. There is normal pulmonary artery systolic pressure. The estimated right ventricular systolic pressure is 31.3 mmHg. 4. Left atrial size was severely dilated. 5. The aortic valve is tricuspid. Aortic valve regurgitation is moderate. No aortic stenosis is present. 6. The inferior vena cava is normal in size with greater than 50% respiratory variability, suggesting right atrial pressure of 3 mmHg.   Laboratory Data: High Sensitivity Troponin:  No results for input(s): TROPONINIHS in the last 720 hours.   Chemistry Recent Labs  Lab 09/07/24 0433 09/08/24 0448 09/09/24 0343  NA 135 138 139  K 3.8 4.3 4.0  CL 97* 101 105  CO2 27 27 30   GLUCOSE 159* 110* 107*  BUN 12 19 18   CREATININE 1.15* 1.48* 1.24*  CALCIUM  8.2* 8.5* 8.4*  MG 2.1 2.2 1.9  GFRNONAA 53* 39* 48*  ANIONGAP 11 10 4*    No results for input(s): PROT, ALBUMIN , AST, ALT, ALKPHOS, BILITOT in the last 168 hours. Lipids No results for input(s): CHOL, TRIG, HDL, LABVLDL, LDLCALC, CHOLHDL in the last 168 hours.  Hematology Recent Labs  Lab 09/07/24 0433 09/08/24 0448 09/09/24 0343  WBC 7.4 6.5 6.9  RBC 2.82* 3.00* 2.80*  HGB 8.8* 9.5* 8.7*  HCT 26.4* 28.8* 27.3*  MCV 93.6 96.0 97.5  MCH 31.2 31.7 31.1  MCHC 33.3 33.0 31.9  RDW 17.9* 18.6* 18.5*  PLT 253 281 282   Thyroid  No results for input(s): TSH, FREET4 in the last 168 hours.  BNPNo results for input(s): BNP, PROBNP in the last 168 hours.  DDimer No results for input(s): DDIMER in the last 168 hours.  Radiology/Studies:  DG Chest 2 View Result  Date: 09/09/2024 EXAM: 2 VIEW(S) XRAY OF THE CHEST 09/09/2024 06:21:38 AM COMPARISON: 09/04/2024 CLINICAL HISTORY: Status post cardiac surgery [id number] FINDINGS: LINES, TUBES AND DEVICES: Stable left PICC line. Cardiac valve replacement and left atrial clip again noted. LUNGS AND PLEURA: Stable small right pleural effusion with associated right lung base opacity. No pneumothorax. HEART AND MEDIASTINUM: Stable mild cardiomegaly. BONES AND SOFT TISSUES: Sternotomy wires  again noted. IMPRESSION: 1. Stable small right pleural effusion. 2. Right lung base opacity which is favored to represent atelectasis in the setting of pleural effusion. 3. Stable mild cardiomegaly. Electronically signed by: Waddell Calk MD 09/09/2024 07:25 AM EST RP Workstation: [id number]    Assessment and Plan:  Rheumatic heart disease S/p AVR/MVR (both bioprosthetic) 08/26/24 Complicated post-op course as above  3.   Conduction system disease post op She has been seen w/variable IVCD, RBBB, LBBB and CHB Escape appears junctional w/narrow QRS, rates 70's 4.    Post-op AFlutter Amiodarone  gtt > 1week transitioned to PO yesterday > stopped yesterday 11/30  11/29 TTE LVEF remains preserved, RV ok, TR better MVR and AVR functioning well  The patient is emotional about the idea of possible PPM< and reports that she really does not want one. She is becoming a bit overwhelmed with everything and really does not want to have more procedures/operations.  She is s/p R mastectomy (and remote lumpectomy) She is s/p L lumpectomy and has had port on the left (removed) She has had b/l XRT  I suspect she will need PPM, though right now, she says shoe would not be agreeable. EP MD will see her    For questions or updates, please contact [facility] Please consult [email or web address] for contact info under    Signed, Charlies Macario Arthur, PA-C  09/09/2024 10:00 AM

## 2024-09-10 DIAGNOSIS — I442 Atrioventricular block, complete: Secondary | ICD-10-CM | POA: Diagnosis not present

## 2024-09-10 DIAGNOSIS — Z952 Presence of prosthetic heart valve: Secondary | ICD-10-CM | POA: Diagnosis not present

## 2024-09-10 DIAGNOSIS — Z9889 Other specified postprocedural states: Secondary | ICD-10-CM | POA: Diagnosis not present

## 2024-09-10 DIAGNOSIS — N179 Acute kidney failure, unspecified: Secondary | ICD-10-CM | POA: Diagnosis not present

## 2024-09-10 DIAGNOSIS — I4892 Unspecified atrial flutter: Secondary | ICD-10-CM | POA: Diagnosis not present

## 2024-09-10 DIAGNOSIS — I519 Heart disease, unspecified: Secondary | ICD-10-CM | POA: Diagnosis not present

## 2024-09-10 LAB — BASIC METABOLIC PANEL WITH GFR
Anion gap: 9 (ref 5–15)
BUN: 22 mg/dL (ref 8–23)
CO2: 26 mmol/L (ref 22–32)
Calcium: 8.8 mg/dL — ABNORMAL LOW (ref 8.9–10.3)
Chloride: 106 mmol/L (ref 98–111)
Creatinine, Ser: 1.14 mg/dL — ABNORMAL HIGH (ref 0.44–1.00)
GFR, Estimated: 53 mL/min — ABNORMAL LOW (ref >60)
Glucose, Bld: 111 mg/dL — ABNORMAL HIGH (ref 70–99)
Potassium: 4.2 mmol/L (ref 3.5–5.1)
Sodium: 141 mmol/L (ref 135–145)

## 2024-09-10 LAB — COOXEMETRY PANEL
Carboxyhemoglobin: 2 % — ABNORMAL HIGH (ref 0.5–1.5)
Methemoglobin: <0.7 % (ref 0.0–1.5)
O2 Saturation: 56.3 %
Total hemoglobin: 9.4 g/dL — ABNORMAL LOW (ref 12.0–16.0)

## 2024-09-10 LAB — APTT
aPTT: >200 s (ref 24–36)
aPTT: 80 s — ABNORMAL HIGH (ref 24–36)

## 2024-09-10 LAB — MAGNESIUM: Magnesium: 2.3 mg/dL (ref 1.7–2.4)

## 2024-09-10 MED ORDER — HEPARIN (PORCINE) 25000 UT/250ML-% IV SOLN
1400.0000 [IU]/h | INTRAVENOUS | Status: DC
  Administered 2024-09-10 – 2024-09-11 (×2): 1400 [IU]/h via INTRAVENOUS
  Filled 2024-09-10 (×2): qty 250

## 2024-09-10 NOTE — Progress Notes (Signed)
 Inpatient Rehab Admissions Coordinator:    CIR following; however, note PT has changed recommendations to home health PT, as Pt is functioning at supervision to mod I level and able to manage a few stair without physical assist.. I agree that [facility] is a more appropriate rehab venue for Pt. Pt. And husband are on board with this plan. CIR will sign off.   Leita Kleine, MS, CCC-SLP Rehab Admissions Coordinator  [phone] (celll) [phone] (office)

## 2024-09-10 NOTE — Progress Notes (Signed)
 PHARMACY - ANTICOAGULATION CONSULT NOTE  Pharmacy Consult for heparin  Indication: atrial fibrillation  Allergies  Allergen Reactions   Sulfa Antibiotics Other (See Comments)    UNKNOWN    Patient Measurements: Height: 5' 1 (154.9 cm) Weight: 97.8 kg (215 lb 9.8 oz) IBW/kg (Calculated) : 47.8 HEPARIN  DW (KG): (P) 71.1  Vital Signs: Temp: 97.6 F (36.4 C) (12/02 2016) Temp Source: Oral (12/02 2016) BP: 126/113 (12/02 2016) Pulse Rate: 66 (12/02 2016)  Labs: Recent Labs    09/08/24 0448 09/09/24 0343 09/10/24 0953 09/10/24 1745 09/10/24 2025  HGB 9.5* 8.7*  --   --   --   HCT 28.8* 27.3*  --   --   --   PLT 281 282  --   --   --   APTT  --   --   --  >200* 80*  CREATININE 1.48* 1.24* 1.14*  --   --     Estimated Creatinine Clearance: 52.7 mL/min (A) (by C-G formula based on SCr of 1.14 mg/dL (H)).   Medical History: Past Medical History:  Diagnosis Date   Allergy    Arthritis    Breast cancer of upper-outer quadrant of right female breast (HCC) 11/14/2014   Treated with chemotherapy and radiation (diagnosed again in March 2024 in both breasts)   Heart murmur    History of chemotherapy    finished chemo 02/03/2015   History of kidney stones    History of seizures    as a child - unknown cause - states was never on anticonvulsants   Hypertension    states under control with meds., has been on med. x years   Mitral regurgitation    Mitral valve prolapse 1990   Rheumatic mitral valve with moderate to severe MR by echo 05/31/2024 => TEE suggests rheumatic mitral valve with severe calcified leaflets and severe mitral regurgitation but also severe aortic regurgitation   Osteoporosis    Personal history of chemotherapy    Personal history of radiation therapy    Port-A-Cath in place 01/24/2023   Seasonal allergies       Assessment: 67 yoF s/p bAVR/MVR 11/17. Pt has had AF s/p DCCV 11/28. Pt on apixaban , now to transition to IV heparin  with need for PPM this  admit. LD apixaban  12/1 pm.  PM update: aPTT >200 was drawn inaccurately through PICC without holding heparin  drip prior. Recheck of 80 sec is therapeutic and was drawn after heparin  held ~10 minutes. No bleeding reported per RN.   Goal of Therapy:  Heparin  level 0.3-0.7 units/ml aPTT 66-102 seconds Monitor platelets by anticoagulation protocol: Yes   Plan:  -Continue Heparin  1400 units/h  -Check confirmatory aPTT with AM labs -Daily aPTT, heparin  level, CBC  Rocky Slade, PharmD, BCPS Clinical Pharmacist [phone] Please check AMION for all [facility] Pharmacy numbers 09/10/2024

## 2024-09-10 NOTE — Progress Notes (Signed)
 Received call from lab regarding critical value on APTT being greater than 200. Patient is on heparin  IV mangaged by pharmacy. Spoke to pharmacist and they asked what site was used for lab draw. Determined that lab was drawn from triple lumen PICC that has Heparin  infusing. Pharmacy advised me to hold Heparin  for 5 to 10 minutes and then redraw lab. Pharmacy to reenter order.

## 2024-09-10 NOTE — Progress Notes (Signed)
 Physical Therapy Treatment Patient Details Name: Katie Jacobson MRN: [id number] DOB: 03/06/1956 Today's Date: 09/10/2024   History of Present Illness Pt is 68 yo presenting to [facility] on [facility]/17 for scheduled AVR/MVR. PMH: breast cancer (triple negative), rheumatic valve disease, severe AI, sever mitral regurgitation, HTN, HLD    PT Comments  Pt making great progress with activity tolerance, ambulating x2 good hallway distances with use of RW and standing rest breaks as needed to recover fatigue. PT explained RW vs rollator use, and use of AD for energy conservation, pt receptive to education. Pt with good functional application of sternal precautions. Plan remains appropriate.   HR 60s-70s throughout session  SPO2 98-100% RA immediately post-gait    If plan is discharge home, recommend the following: Assist for transportation;Help with stairs or ramp for entrance;Assistance with cooking/housework;A little help with walking and/or transfers;A little help with bathing/dressing/bathroom   Can travel by private vehicle        Equipment Recommendations  Rolling walker (2 wheels);BSC/3in1 (vs rollator pending progress)    Recommendations for Other Services       Precautions / Restrictions Precautions Precautions: Sternal Precaution Booklet Issued: No Recall of Precautions/Restrictions: Intact Restrictions Weight Bearing Restrictions Per Provider Order: Yes Other Position/Activity Restrictions: cardiac sternal precautions - reviewed move in the tube and pt able to explain this functionally     Mobility  Bed Mobility Overal bed mobility: Needs Assistance             General bed mobility comments: up in recliner    Transfers Overall transfer level: Needs assistance Equipment used: Rolling walker (2 wheels) Transfers: Sit to/from Stand Sit to Stand: Supervision                Ambulation/Gait Ambulation/Gait assistance: Supervision Gait Distance (Feet): 140 Feet (x2  - 3 minute standing rest break at midpoint) Assistive device: Rolling walker (2 wheels) Gait Pattern/deviations: Step-through pattern, Decreased stride length, Trunk flexed Gait velocity: decr     General Gait Details: cues for proximity of RW and use of RW for energy conservation, HR 60s-70s throughout   Stairs             Wheelchair Mobility     Tilt Bed    Modified Rankin (Stroke Patients Only)       Balance Overall balance assessment: Needs assistance Sitting-balance support: Feet supported, No upper extremity supported Sitting balance-Leahy Scale: Good     Standing balance support: No upper extremity supported Standing balance-Leahy Scale: Fair Standing balance comment: stands statically without UE support.                            Communication Communication Communication: No apparent difficulties  Cognition Arousal: Alert Behavior During Therapy: WFL for tasks assessed/performed   PT - Cognitive impairments: No apparent impairments                       PT - Cognition Comments: tangential, cooperative Following commands: Intact      Cueing Cueing Techniques: Verbal cues, Gestural cues  Exercises Other Exercises Other Exercises: PT inserviced pt on rollator use, especially locking/unlocking, proximity to rollator, and rollator use for energy conservation and rest breaks as needed. Pt expresses understanding, declines practicing with it    General Comments        Pertinent Vitals/Pain Pain Assessment Pain Assessment: Faces Faces Pain Scale: Hurts little more Pain Location: back Pain Descriptors /  Indicators: Aching, Discomfort Pain Intervention(s): Limited activity within patient's tolerance, Monitored during session, Repositioned    Home Living                          Prior Function            PT Goals (current goals can now be found in the care plan section) Acute Rehab PT Goals Patient Stated Goal:  to improve mobility/decrease pain PT Goal Formulation: With patient Time For Goal Achievement: 09/24/24 Potential to Achieve Goals: Good Progress towards PT goals: Progressing toward goals    Frequency    Min 2X/week      PT Plan      Co-evaluation              AM-PAC PT 6 Clicks Mobility   Outcome Measure  Help needed turning from your back to your side while in a flat bed without using bedrails?: A Little Help needed moving from lying on your back to sitting on the side of a flat bed without using bedrails?: A Little Help needed moving to and from a bed to a chair (including a wheelchair)?: A Little Help needed standing up from a chair using your arms (e.g., wheelchair or bedside chair)?: A Little Help needed to walk in hospital room?: A Little Help needed climbing 3-5 steps with a railing? : A Lot 6 Click Score: 17    End of Session   Activity Tolerance: Patient tolerated treatment well;Patient limited by fatigue Patient left: in chair;with call bell/phone within reach Nurse Communication: Mobility status PT Visit Diagnosis: Unsteadiness on feet (R26.81);Other abnormalities of gait and mobility (R26.89);Muscle weakness (generalized) (M62.81);Pain;Difficulty in walking, not elsewhere classified (R26.2)     Time: 8675-8647 PT Time Calculation (min) (ACUTE ONLY): 28 min  Charges:    $Gait Training: 8-22 mins $Therapeutic Activity: 8-22 mins PT General Charges $$ ACUTE PT VISIT: 1 Visit                     Johana RAMAN, PT DPT Acute Rehabilitation Services Secure Chat Preferred  Office [phone]    Moishe Schellenberg E Johna 09/10/2024, 2:35 PM

## 2024-09-10 NOTE — TOC Transition Note (Signed)
 Transition of Care [facility]) - Discharge Note   Patient Details  Name: Katie Jacobson MRN: [id number] Date of Birth: 1956/09/01  Transition of Care [facility]) CM/SW Contact:  Justina Delcia Czar, RN Phone Number: [phone] 09/10/2024, 12:25 PM   Clinical Narrative:     Spoke to patient and states she does want Rollator and 3n1 bedside commode. Contacted [facility] for request.   Pt preoperatively arranged with [facility] for [facility]. Contacted rep, Zebedee to follow up on referral.   Husband will provide transportation home.   Will schedule PCP hospital follow up appt at dc.   Final next level of care: Home w Home Health Services Barriers to Discharge: No Barriers Identified   Patient Goals and CMS Choice Patient states their goals for this hospitalization and ongoing recovery are:: wants to recover CMS Medicare.gov Compare Post Acute Care list provided to:: Patient Choice offered to / list presented to : Patient      Discharge Placement                       Discharge Plan and Services Additional resources added to the After Visit Summary for     Discharge Planning Services: CM Consult Post Acute Care Choice: Home Health          DME Arranged: 3-N-1, Walker rolling with seat DME Agency: [facility] Date DME Agency Contacted: 09/10/24 Time DME Agency Contacted: 1225 Representative spoke with at DME Agency: [facility] HH Arranged: RN HH Agency: [facility] Date HH Agency Contacted: 09/03/24 Time HH Agency Contacted: 1533 Representative spoke with at [facility] Agency: Zebedee Braver  Social Drivers of Health (SDOH) Interventions SDOH Screenings   Food Insecurity: Unknown (08/28/2024)  Housing: Low Risk  (08/28/2024)  Transportation Needs: No Transportation Needs (08/29/2024)  Utilities: Not At Risk (08/29/2024)  Depression (PHQ2-9): Low Risk  (12/27/2023)  Social Connections: Unknown (08/28/2024)  Tobacco Use: High Risk (08/26/2024)      Readmission Risk Interventions     No data to display

## 2024-09-10 NOTE — Progress Notes (Signed)
 PHARMACY - ANTICOAGULATION CONSULT NOTE  Pharmacy Consult for heparin  Indication: atrial fibrillation  Allergies  Allergen Reactions   Sulfa Antibiotics Other (See Comments)    UNKNOWN    Patient Measurements: Height: 5' 1 (154.9 cm) Weight: 97.8 kg (215 lb 9.8 oz) IBW/kg (Calculated) : 47.8 HEPARIN  DW (KG): (P) 71.1  Vital Signs: Temp: 97.8 F (36.6 C) (12/02 0401) Temp Source: Oral (12/02 0401) BP: 111/70 (12/02 0834) Pulse Rate: 115 (12/02 0834)  Labs: Recent Labs    09/08/24 0448 09/09/24 0343  HGB 9.5* 8.7*  HCT 28.8* 27.3*  PLT 281 282  CREATININE 1.48* 1.24*    Estimated Creatinine Clearance: 48.4 mL/min (A) (by C-G formula based on SCr of 1.24 mg/dL (H)).   Medical History: Past Medical History:  Diagnosis Date   Allergy    Arthritis    Breast cancer of upper-outer quadrant of right female breast (HCC) 11/14/2014   Treated with chemotherapy and radiation (diagnosed again in March 2024 in both breasts)   Heart murmur    History of chemotherapy    finished chemo 02/03/2015   History of kidney stones    History of seizures    as a child - unknown cause - states was never on anticonvulsants   Hypertension    states under control with meds., has been on med. x years   Mitral regurgitation    Mitral valve prolapse 1990   Rheumatic mitral valve with moderate to severe MR by echo 05/31/2024 => TEE suggests rheumatic mitral valve with severe calcified leaflets and severe mitral regurgitation but also severe aortic regurgitation   Osteoporosis    Personal history of chemotherapy    Personal history of radiation therapy    Port-A-Cath in place 01/24/2023   Seasonal allergies       Assessment: 67 yoF s/p bAVR/MVR 11/17. Pt has had AF s/p DCCV 11/28. Pt on apixaban , now to transition to IV heparin  with need for PPM this admit. LD apixaban  12/1 pm.  Goal of Therapy:  Heparin  level 0.3-0.7 units/ml aPTT 66-102 seconds Monitor platelets by  anticoagulation protocol: Yes   Plan:  -Heparin  1400 units/h no bolus -Check aPTT in 8h -Daily aPTT, heparin  level, CBC  Ozell Jamaica, PharmD, BCPS, [facility] Clinical Pharmacist [phone] Please check AMION for all [facility] Pharmacy numbers 09/10/2024

## 2024-09-10 NOTE — TOC Progression Note (Signed)
 Transition of Care [facility]) - Progression Note    Patient Details  Name: Katie Jacobson MRN: [id number] Date of Birth: 06-Apr-1956  Transition of Care [facility]) CM/SW Contact  Arlana JINNY Nicholaus ISRAEL Phone Number: [phone] 09/10/2024, 12:50 PM  Clinical Narrative:  HF CSW met with the patient at bedside. Patient stated that she was doing ok and did not need anything at this time. Patient inquired about HHPT. CSW explained that PT made the recommendation for HHPT. CSW notified HF NCM via secure chat. NCM will follow up with patient.   HF CSW/CM will continue to follow and monitor for dc readiness.      Expected Discharge Plan: Home w Home Health Services Barriers to Discharge: No Barriers Identified               Expected Discharge Plan and Services   Discharge Planning Services: CM Consult Post Acute Care Choice: Home Health Living arrangements for the past 2 months: Single Family Home                 DME Arranged: 3-N-1, Walker rolling with seat DME Agency: [facility] Date DME Agency Contacted: 09/10/24 Time DME Agency Contacted: 1225 Representative spoke with at DME Agency: [facility] HH Arranged: RN HH Agency: [facility] Date HH Agency Contacted: 09/03/24 Time HH Agency Contacted: 1533 Representative spoke with at [facility] Agency: Zebedee Braver   Social Drivers of Health (SDOH) Interventions SDOH Screenings   Food Insecurity: Unknown (08/28/2024)  Housing: Low Risk  (08/28/2024)  Transportation Needs: No Transportation Needs (08/29/2024)  Utilities: Not At Risk (08/29/2024)  Depression (PHQ2-9): Low Risk  (12/27/2023)  Social Connections: Unknown (08/28/2024)  Tobacco Use: High Risk (08/26/2024)    Readmission Risk Interventions     No data to display

## 2024-09-10 NOTE — TOC Transition Note (Signed)
 Transition of Care [facility]) - Discharge Note   Patient Details  Name: Katie Jacobson MRN: [id number] Date of Birth: 08/10/1956  Transition of Care [facility]) CM/SW Contact:  Justina Delcia Czar, RN Phone Number: [phone] 09/10/2024, 4:26 PM   Clinical Narrative:    Spoke to pt and states she agreeable to [facility] with [facility]. Medicare.gov listing with rating placed on chart. [employer] delivered Rollator and 3n1 bedside commode to room.   Will arrange hospital follow up appt with PCP at time of dc.   Husband will provide transportation at dc.    Final next level of care: Home w Home Health Services Barriers to Discharge: No Barriers Identified   Patient Goals and CMS Choice Patient states their goals for this hospitalization and ongoing recovery are:: wants to recover CMS Medicare.gov Compare Post Acute Care list provided to:: Patient Choice offered to / list presented to : Patient      Discharge Placement                       Discharge Plan and Services Additional resources added to the After Visit Summary for     Discharge Planning Services: CM Consult Post Acute Care Choice: Home Health          DME Arranged: 3-N-1, Walker rolling with seat DME Agency: [facility] Date DME Agency Contacted: 09/10/24 Time DME Agency Contacted: 1225 Representative spoke with at DME Agency: [facility] HH Arranged: PT HH Agency: [facility] Date HH Agency Contacted: 09/03/24 Time HH Agency Contacted: 1533 Representative spoke with at [facility] Agency: Zebedee Braver  Social Drivers of Health (SDOH) Interventions SDOH Screenings   Food Insecurity: Unknown (08/28/2024)  Housing: Low Risk  (08/28/2024)  Transportation Needs: No Transportation Needs (08/29/2024)  Utilities: Not At Risk (08/29/2024)  Depression (PHQ2-9): Low Risk  (12/27/2023)  Social Connections: Unknown (08/28/2024)  Tobacco Use: High Risk (08/26/2024)     Readmission Risk Interventions     No  data to display

## 2024-09-10 NOTE — Care Management Important Message (Signed)
 Important Message  Patient Details  Name: Katie Jacobson MRN: [id number] Date of Birth: November 12, 1955   Important Message Given:  Yes - Medicare IM     Jennie Laneta Dragon 09/10/2024, 11:47 AM

## 2024-09-10 NOTE — Progress Notes (Signed)
 Pt declined ambulation as she is eating and also feels she needs time to think about the PPM. Eager for education. Discussed with pt IS, sternal precautions, tobacco cessation, exercise, and CRPII. Pt receptive. She sts at this time she has quit smoking. Gave resources and encouragement. Will refer to [facility].   Encouraged pt to ambulate on her own when she is ready.  [phone] Aliene Aris BS, ACSM-CEP 09/10/2024 8:55 AM

## 2024-09-10 NOTE — Progress Notes (Addendum)
 Patient ID: Katie Jacobson, female   DOB: December 25, 1955, 68 y.o.   MRN: [id number]     Advanced Heart Failure Rounding Note  Cardiologist: Alm Clay, MD  Chief Complaint: CHF Subjective:    POD# 15  Underwent DC-CV 11/28.  Back in AFL w/ CVR 60s. No further CHB.   Co-ox 56%. Volume improved, below pre-op wt.   Feels good. Denies dyspnea. Ambulating well. Appetite good.    Echo 11/29 EF 60% RV ok. Mean AVG 14. Mild TR  Objective:   Weight Range: 97.8 kg Body mass index is 40.74 kg/m.   Vital Signs:   Temp:  [97.8 F (36.6 C)-98.5 F (36.9 C)] 97.8 F (36.6 C) (12/02 0401) Pulse Rate:  [57-70] 62 (12/02 0401) Resp:  [17-23] 17 (12/02 0401) BP: (101-131)/(55-98) 115/55 (12/02 0401) SpO2:  [95 %-100 %] 100 % (12/02 0401) Weight:  [97.8 kg] 97.8 kg (12/02 0401) Last BM Date : 09/09/24  Weight change: Filed Weights   09/08/24 0500 09/09/24 0537 09/10/24 0401  Weight: 94.5 kg 97.4 kg 97.8 kg    Intake/Output:   Intake/Output Summary (Last 24 hours) at 09/10/2024 0805 Last data filed at 09/10/2024 0630 Gross per 24 hour  Intake 960 ml  Output 400 ml  Net 560 ml      Physical Exam   General:  Well appearing. No respiratory difficulty HEENT: normal Neck: supple. no JVD. Carotids 2+ bilat; no bruits. No lymphadenopathy or thyromegaly appreciated. Cor: PMI nondisplaced. Regular rate & rhythm. No rubs, gallops or murmurs. Lungs: clear Abdomen: soft, nontender, nondistended. No hepatosplenomegaly. No bruits or masses. Good bowel sounds. Extremities: no cyanosis, clubbing, rash, edema Neuro: alert & oriented x 3, cranial nerves grossly intact. moves all 4 extremities w/o difficulty. Affect pleasant.   Telemetry   AFL mid 60s, no further CHB. Personally reviewed   Labs    CBC Recent Labs    09/08/24 0448 09/09/24 0343  WBC 6.5 6.9  HGB 9.5* 8.7*  HCT 28.8* 27.3*  MCV 96.0 97.5  PLT 281 282   Basic Metabolic Panel Recent Labs    88/69/74 0448  09/09/24 0343 09/10/24 0550  NA 138 139  --   K 4.3 4.0  --   CL 101 105  --   CO2 27 30  --   GLUCOSE 110* 107*  --   BUN 19 18  --   CREATININE 1.48* 1.24*  --   CALCIUM  8.5* 8.4*  --   MG 2.2 1.9 2.3   Liver Function Tests No results for input(s): AST, ALT, ALKPHOS, BILITOT, PROT, ALBUMIN  in the last 72 hours. No results for input(s): LIPASE, AMYLASE in the last 72 hours. Cardiac Enzymes No results for input(s): CKTOTAL, CKMB, CKMBINDEX, TROPONINI in the last 72 hours.  BNP: BNP (last 3 results) No results for input(s): BNP in the last 8760 hours.  ProBNP (last 3 results) No results for input(s): PROBNP in the last 8760 hours.   D-Dimer No results for input(s): DDIMER in the last 72 hours. Hemoglobin A1C No results for input(s): HGBA1C in the last 72 hours. Fasting Lipid Panel No results for input(s): CHOL, HDL, LDLCALC, TRIG, CHOLHDL, LDLDIRECT in the last 72 hours. Thyroid  Function Tests No results for input(s): TSH, T4TOTAL, T3FREE, THYROIDAB in the last 72 hours.  Invalid input(s): FREET3  Other results:   Imaging    No results found.      Medications:     Scheduled Medications:  acetaminophen   500 mg Oral QID  apixaban   5 mg Oral BID   aspirin  EC  81 mg Oral Daily   bisacodyl   10 mg Oral Daily   Or   bisacodyl   10 mg Rectal Daily   Chlorhexidine  Gluconate Cloth  6 each Topical Daily   [facility], Patient & Family Education   Does not apply Once   feeding supplement  237 mL Oral BID BM   lidocaine   1 patch Transdermal Q24H   pantoprazole   40 mg Oral Daily   polyethylene glycol  17 g Oral Daily   senna  1 tablet Oral Daily   sodium chloride  flush  10-40 mL Intracatheter Q12H   sodium chloride  flush  3 mL Intravenous Q12H   sodium chloride  flush  3 mL Intravenous Q12H   spironolactone   25 mg Oral Daily    Infusions:  promethazine  (PHENERGAN ) injection (IM or IVPB)  Stopped (08/31/24 1154)    PRN Medications: acetaminophen , albuterol , HYDROmorphone  (DILAUDID ) injection, hydrOXYzine , methocarbamol , ondansetron  (ZOFRAN ) IV, mouth rinse, oxyCODONE , polyethylene glycol, promethazine  (PHENERGAN ) injection (IM or IVPB), simethicone , sodium chloride  flush, sodium chloride  flush, sodium chloride  flush    Assessment/Plan   1. Rheumatic heart disease: Severe MR and severe AI on 9/25 TEE with EF 55-60%. S/p AVR with 21 mm Inspiris valve, MVR with 27 mm Mitris valve and LAA ligation by Dr. Aki Burdin on 08/26/24. Post-op echo (11/20) showed LV EF 65%, mild RV dilation with normal systolic function but there is likely severe tricuspid regurgitation, s/p bioprosthetic MV replacement with mean gradient 4, s/p bioprosthetic AV replacement with mean gradient 20.   - Echo 11/29 EF 60% RV ok. Mean AVG 14. Mild TR 2. Post-op cardiogenic shock/RV failure:  Echo post-op on 11/20 showed LV EF 65%, mild RV dilation with normal systolic function but there is likely severe tricuspid regurgitation, s/p bioprosthetic MV replacement with mean gradient 4, s/p bioprosthetic AV replacement with mean gradient 20.  - Co-ox 56% off  milrinone  (stopped 11/29) - Volume status much improved. Euvolemic. Torsemide  20 PRN  - Continue Spiro 25 mg daily  3. Atrial flutter: Atypical, noted post-op. - s/p DC-CV 11/28. -> sinus with CHB - Continue Eliquis  5 mg bid  - Amio stopped 11/30 with CHB - EP now following  4. CHB - now off amio - EP consulted as above. Anticipate PPM prior to d/c  4. AKI: - Scr up improved 1.15 -> 1.48 -> 1.24->pending  5. Anemia: Post-op.  Transfuse hgb < 8. Hgb 8.7 yesterday  6. Hypokalemia - BMP pending - continue spiro  7. Post-op ileus - Resolved    Length of Stay: [address], PA-C  09/10/2024, 8:05 AM  Advanced Heart Failure Team Pager [phone] (M-F; 7a - 5p)  Please contact [facility] for night-coverage after hours (5p -7a ) and weekends on  [email or web address]   Patient seen and examined with the above-signed Advanced Practice Provider and/or Housestaff. I personally reviewed laboratory data, imaging studies and relevant notes. I independently examined the patient and formulated the important aspects of the plan. I have edited the note to reflect any of my changes or salient points. I have personally discussed the plan with the patient and/or family.  Feels good. Remains in AFL with CVR. Co-ox ok. Torsemide  stopped this am. BMET pending  Willing to consider PPM  General:  Sitting up in chair. No resp difficulty HEENT: normal Neck: supple. no JVD.  Cor: Irregular rate & rhythm. No rubs, gallops or murmurs. Lungs: clear Abdomen: soft, nontender,  nondistended.Good bowel sounds. Extremities: no cyanosis, clubbing, rash, edema Neuro: alert & orientedx3, cranial nerves grossly intact. moves all 4 extremities w/o difficulty. Affect pleasant  Looks good from HF perspective. She is deciding on PPM. If decides to proceed will need LBBB lead and once PPM in place can restart amio and DC-CV back to sinus. I discussed this with her at length.   Continue spiro. Eventual SGLT2i   Toribio Fuel, MD  8:31 AM

## 2024-09-10 NOTE — Progress Notes (Signed)
 [address], [place] [employer] [address]             [phone]  15 Days Post-Op Procedure(s) (LRB): REPLACEMENT, AORTIC VALVE, OPEN, UTILIZING INSPIRIS RESILIA  AORTIC VALVE (N/A) REPLACEMENT, MITRAL VALVE, UTILIZING MITRIS RESILIA MITRAL VALVE (N/A) CLIPPING, LEFT ATRIAL APPENDAGE UTILIZING ATRICURE PRO 40 (N/A) ECHOCARDIOGRAM, TRANSESOPHAGEAL, INTRAOPERATIVE (N/A) Subjective:  Awake and alert, had a good day overall yesterday. Walked in the hall several times requiring minimal assistance. She remains anxious about the recommendation for a permanent pacemaker.  CoOx 57    Objective: Vital signs in last 24 hours: Temp:  [97.8 F (36.6 C)-98.5 F (36.9 C)] 97.8 F (36.6 C) (12/02 0401) Pulse Rate:  [57-70] 62 (12/02 0401) Cardiac Rhythm: Atrial flutter (12/02 0335) Resp:  [17-23] 17 (12/02 0401) BP: (101-131)/(55-98) 115/55 (12/02 0401) SpO2:  [95 %-100 %] 100 % (12/02 0401) Weight:  [97.8 kg] 97.8 kg (12/02 0401)  Hemodynamic parameters for last 24 hours:    Intake/Output from previous day: 12/01 0701 - 12/02 0700 In: 540 [P.O.:480; I.V.:10; IV Piggyback:50] Out: 100 [Urine:100] Intake/Output this shift: No intake/output data recorded.  General appearance: alert, cooperative, and no distress Neurologic: intact  Heart: Atrial flutter, VR 65-70/min Lungs: normal work of breathing on RA, breath sounds clear.  Abdomen: soft, no tenderness Extremities:  Unna boots to both LE's ( changed 12/1), toes warm and have mild swelling. Wound: the sternotomy incision is intact and healing well.  Lab Results: Recent Labs    09/08/24 0448 09/09/24 0343  WBC 6.5 6.9  HGB 9.5* 8.7*  HCT 28.8* 27.3*  PLT 281 282   BMET:  Recent Labs    09/08/24 0448 09/09/24 0343  NA 138 139  K 4.3 4.0  CL 101 105  CO2 27 30  GLUCOSE 110* 107*  BUN 19 18  CREATININE 1.48* 1.24*  CALCIUM  8.5* 8.4*    PT/INR: No results for input(s): LABPROT, INR in  the last 72 hours. ABG    Component Value Date/Time   PHART 7.415 08/29/2024 0105   HCO3 22.7 08/29/2024 0105   TCO2 24 08/29/2024 0105   ACIDBASEDEF 2.0 08/29/2024 0105   O2SAT 56.3 09/10/2024 0550   CBG (last 3)  No results for input(s): GLUCAP in the last 72 hours.  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 Post-op cardiogenic shock requiring inotropic support for several days in the ICU. CoOx stable 57. On ASA, spiro.  -Post-op atrial fibrillation / flutter. S/P successful DCCV on 11/28 but now back in a-flutter. BMP pending,  Mg++ 2.3.  On Eliquis , amiodarone  on hold for CHB on 11/30. Appreciate EP evaluation.  PPM recommended but she is reluctant to agree to another procedure.  Counseled extensively by Dr. Almetta and Dr. Daniel regarding importance of PPM.   -PULM- good oxygenation on RA.  Small right effusion on CXR, stable. Lung fields otherwise clear.   -GI-post-op ileus resolved. Appetite improved, BM x 3 yesterday  -RENAL- AKI post-op but creat near baseline yesterday. Hold torsemide  this morning until BMP results available.   -HEME- expected ABL anemia.  Hct has been stable, monitoring.   -Disposition- She has made excellent progress  with mobility and transfers. PT recommending HH PT at discharge with rolling walker and BSC. Discharge target date is primarily dependent on her decision regarding PPM.      LOS: 15 days    Hayleigh Bawa G. Aralynn Brake, PA-C 09/10/2024

## 2024-09-10 NOTE — Progress Notes (Signed)
 Rounding Note   Patient Name: Katie Jacobson Date of Encounter: 09/10/2024  [facility] Cardiologist: Alm Clay, MD   Subjective  A little restless this AM, woke ~0400, could't get back to sleep No CP, no SOB, no palpitations  Scheduled Meds:  acetaminophen   500 mg Oral QID   apixaban   5 mg Oral BID   aspirin  EC  81 mg Oral Daily   bisacodyl   10 mg Oral Daily   Or   bisacodyl   10 mg Rectal Daily   Chlorhexidine  Gluconate Cloth  6 each Topical Daily   [facility], Patient & Family Education   Does not apply Once   feeding supplement  237 mL Oral BID BM   lidocaine   1 patch Transdermal Q24H   pantoprazole   40 mg Oral Daily   polyethylene glycol  17 g Oral Daily   senna  1 tablet Oral Daily   sodium chloride  flush  10-40 mL Intracatheter Q12H   sodium chloride  flush  3 mL Intravenous Q12H   sodium chloride  flush  3 mL Intravenous Q12H   spironolactone   25 mg Oral Daily   Continuous Infusions:  promethazine  (PHENERGAN ) injection (IM or IVPB) Stopped (08/31/24 1154)   PRN Meds: acetaminophen , albuterol , HYDROmorphone  (DILAUDID ) injection, hydrOXYzine , methocarbamol , ondansetron  (ZOFRAN ) IV, mouth rinse, oxyCODONE , polyethylene glycol, promethazine  (PHENERGAN ) injection (IM or IVPB), simethicone , sodium chloride  flush, sodium chloride  flush, sodium chloride  flush   Vital Signs  Vitals:   09/09/24 1932 09/09/24 2345 09/10/24 0401 09/10/24 0834  BP: 126/77 121/60 (!) 115/55 111/70  Pulse: 70 (!) 57 62 (!) 115  Resp:   17 (!) 29  Temp: 97.9 F (36.6 C) 97.8 F (36.6 C) 97.8 F (36.6 C)   TempSrc: Oral Oral Oral   SpO2: 100% 99% 100% 97%  Weight:   97.8 kg   Height:        Intake/Output Summary (Last 24 hours) at 09/10/2024 0849 Last data filed at 09/10/2024 0630 Gross per 24 hour  Intake 710 ml  Output 400 ml  Net 310 ml      09/10/2024    4:01 AM 09/09/2024    5:37 AM 09/08/2024    5:00 AM  Last 3 Weights  Weight (lbs) 215 lb  9.8 oz 214 lb 11.7 oz 208 lb 5.4 oz  Weight (kg) 97.8 kg 97.4 kg 94.5 kg      Telemetry  AFlutter 70's generally 4:1 conduction - Personally Reviewed  ECG   No new EKGs - Personally Reviewed  Physical Exam  GEN: No acute distress.   Neck: No JVD Cardiac: RRR, no murmurs, rubs, or gallops.  Respiratory: CTA b/l GI: Soft, nontender, non-distended  MS: unna boots b/l Neuro:  Nonfocal  Psych: Normal affect   Labs High Sensitivity Troponin:  No results for input(s): TROPONINIHS in the last 720 hours.   Chemistry Recent Labs  Lab 09/07/24 0433 09/08/24 0448 09/09/24 0343 09/10/24 0550  NA 135 138 139  --   K 3.8 4.3 4.0  --   CL 97* 101 105  --   CO2 27 27 30   --   GLUCOSE 159* 110* 107*  --   BUN 12 19 18   --   CREATININE 1.15* 1.48* 1.24*  --   CALCIUM  8.2* 8.5* 8.4*  --   MG 2.1 2.2 1.9 2.3  GFRNONAA 53* 39* 48*  --   ANIONGAP 11 10 4*  --     Lipids No results for input(s): CHOL,  TRIG, HDL, LABVLDL, LDLCALC, CHOLHDL in the last 168 hours.  Hematology Recent Labs  Lab 09/07/24 0433 09/08/24 0448 09/09/24 0343  WBC 7.4 6.5 6.9  RBC 2.82* 3.00* 2.80*  HGB 8.8* 9.5* 8.7*  HCT 26.4* 28.8* 27.3*  MCV 93.6 96.0 97.5  MCH 31.2 31.7 31.1  MCHC 33.3 33.0 31.9  RDW 17.9* 18.6* 18.5*  PLT 253 281 282   Thyroid  No results for input(s): TSH, FREET4 in the last 168 hours.  BNPNo results for input(s): BNP, PROBNP in the last 168 hours.  DDimer No results for input(s): DDIMER in the last 168 hours.   Radiology  DG Chest 2 View Result Date: 09/09/2024 EXAM: 2 VIEW(S) XRAY OF THE CHEST 09/09/2024 06:21:38 AM COMPARISON: 09/04/2024 CLINICAL HISTORY: Status post cardiac surgery [id number] FINDINGS: LINES, TUBES AND DEVICES: Stable left PICC line. Cardiac valve replacement and left atrial clip again noted. LUNGS AND PLEURA: Stable small right pleural effusion with associated right lung base opacity. No pneumothorax. HEART AND MEDIASTINUM: Stable mild  cardiomegaly. BONES AND SOFT TISSUES: Sternotomy wires again noted. IMPRESSION: 1. Stable small right pleural effusion. 2. Right lung base opacity which is favored to represent atelectasis in the setting of pleural effusion. 3. Stable mild cardiomegaly. Electronically signed by: Waddell Calk MD 09/09/2024 07:25 AM EST RP Workstation: [id number]    Cardiac Studies  09/07/24: TTE 1. Left ventricular ejection fraction, by estimation, is 60 to 65%. The  left ventricle has normal function. The left ventricle has no regional  wall motion abnormalities. Left ventricular diastolic function could not  be evaluated.   2. Right ventricular systolic function is normal. The right ventricular  size is normal. There is normal pulmonary artery systolic pressure. The  estimated right ventricular systolic pressure is 31.3 mmHg.   3. The mitral valve has been repaired/replaced. Trivial mitral valve  regurgitation. No evidence of mitral stenosis. The mean mitral valve  gradient is 3.0 mmHg. There is a Mitris Resila present in the mitral  position.   4. The aortic valve has been repaired/replaced. Aortic valve  regurgitation is not visualized. No aortic stenosis is present. There is a  21 mm Insiris Resila valve present in the aortic position. Aortic valve  mean gradient measures 14.0 mmHg. Aortic  valve Vmax measures 2.45 m/s.   5. The inferior vena cava is dilated in size with <50% respiratory  variability, suggesting right atrial pressure of 15 mmHg.   6. Compared to study dated 08/29/2024, the mean AVG has decreased from  to , Vmax has decreased from 3.63m/s to 2.48m/s and DI has  increased from 0.5 to 0.8. There is a normal functioning bioprosthetic MVR  present with no significant change  from prior study. PA paressure has normalized. RAP remains elevated at  .     CARDIAC CATHETERIZATION 07/16/2024   Conclusion Table formatting from the original result was not  included. Images from the original result were not included.     Prox LAD to Mid LAD lesion is 50% stenosed.   Hemodynamic findings consistent with mild pulmonary hypertension.  PAP-mean 48/16-29 mmHg with PCWP of 22 to 24 mmHg   There is no aortic valve stenosis.-Unable to fully assess for AI.   There is severe MR by Echo/TEE   In the absence of any other complications or medical issues, we expect the patient to be ready for discharge from a cath perspective on 07/16/2024.   She will proceed with planned T CTS consultation for mitral and aortic valve  disease.   Recommend Aspirin  81mg  daily for moderate CAD.   Dominance: Left Angiographically moderate single-vessel disease with segmental 50% calcified stenosis in the mid LAD from SP1-D2 Mild Pulmonary Hypertension with mean PAP 29 mL of mercury and PCWP of 22 to 24 mmHg -> WHO Class II (due to valvular disease) Due to Valvular Disease, Cardiac Output and Index are mild to moderately reduced.   RECOMMENDATONS   In the absence of any other complications or medical issues, we expect the patient to be ready for discharge from a cath perspective on 07/16/2024.   She will proceed with planned TCTS consultation for mitral and aortic valve disease.   Recommend Aspirin  81mg  daily for moderate CAD.     05/31/24: TTE IMPRESSIONS 1. Moderate to severe mitral regurgitation is now present. No stenosis. Would consider TEE for quantification of MR. The mitral valve is rheumatic. Moderate to severe mitral valve regurgitation. The mean mitral valve gradient is 2.5 mmHg with average heart rate of 63 bpm. 2. Left ventricular ejection fraction, by estimation, is 55 to 60%. The left ventricle has normal function. The left ventricle has no regional wall motion abnormalities. Left ventricular diastolic function could not be evaluated. 3. Right ventricular systolic function is normal. The right ventricular size is mildly enlarged. There is normal pulmonary artery  systolic pressure. The estimated right ventricular systolic pressure is 31.3 mmHg. 4. Left atrial size was severely dilated. 5. The aortic valve is tricuspid. Aortic valve regurgitation is moderate. No aortic stenosis is present. 6. The inferior vena cava is normal in size with greater than 50% respiratory variability, suggesting right atrial pressure of 3 mmHg.     Patient Profile   68 y.o. female w/PMHx of  Breast Ca (R lumpectomy, chemo/XRT 2016 >>> 2024 b/l breast CA > left lumpectomy and R mastectomy, chemo and photon tx b/l) Smoker, HTN Rheumatic heart disease   Admitted for: planned MVR/AVR Median Sternotomy Extracorporeal circulation Femoral venous cannulation 4.   Aortic valve replacement using a 21 mm Inspiris valve 5.   Mitral valve replacement using a 27 mm Mitris valve 6.   Left atrial appendage ligation  Post op course c/b cardiogenic shock AFlutter, SB/junctional , alternating BBB Now off amiodarone  EP engaged to consider PPM  Assessment & Plan   Rheumatic heart disease S/p AVR/MVR (both bioprosthetic) 08/26/24 Complicated post-op course   3.   Conduction system disease post op She has been seen w/variable IVCD, RBBB, LBBB and CHB Escape appears junctional w/narrow QRS, rates 70's 4.    Post-op AFlutter Amiodarone  gtt > 1week transitioned to PO 11/30 > stopped 11/30   11/29 TTE LVEF remains preserved, RV ok, TR better MVR and AVR functioning well  She is s/p R mastectomy (and remote lumpectomy) She is s/p L lumpectomy and has had port on the left (removed) She has had b/l XRT   The patient was emotional yesterday about the idea of possible PPM, and declined, having become a bit overwhelmed with everything and really did not want to have more procedures/operations. While she is not ready/or agreeable this morning, she is giving it some more thought.  Will change her Eliquis  to heparin  with anticipation of PPM prior to discharge, timing TBD  Dr.  Almetta has been bedside/seen/examined the patient this morning      For questions or updates, please contact [facility] Please consult [email or web address] for contact info under    Signed, Charlies Macario Arthur, PA-C  09/10/2024, 8:49 AM

## 2024-09-11 ENCOUNTER — Encounter (HOSPITAL_COMMUNITY): Payer: Self-pay

## 2024-09-11 ENCOUNTER — Encounter (HOSPITAL_COMMUNITY): Admission: RE | Disposition: A | Payer: Self-pay | Source: Home / Self Care

## 2024-09-11 DIAGNOSIS — I4892 Unspecified atrial flutter: Secondary | ICD-10-CM | POA: Diagnosis not present

## 2024-09-11 DIAGNOSIS — I442 Atrioventricular block, complete: Secondary | ICD-10-CM | POA: Diagnosis not present

## 2024-09-11 DIAGNOSIS — Z9889 Other specified postprocedural states: Secondary | ICD-10-CM | POA: Diagnosis not present

## 2024-09-11 DIAGNOSIS — Z952 Presence of prosthetic heart valve: Secondary | ICD-10-CM | POA: Diagnosis not present

## 2024-09-11 HISTORY — PX: PACEMAKER IMPLANT: EP1218

## 2024-09-11 LAB — CBC
HCT: 26.9 % — ABNORMAL LOW (ref 36.0–46.0)
Hemoglobin: 8.5 g/dL — ABNORMAL LOW (ref 12.0–15.0)
MCH: 31 pg (ref 26.0–34.0)
MCHC: 31.6 g/dL (ref 30.0–36.0)
MCV: 98.2 fL (ref 80.0–100.0)
Platelets: 279 K/uL (ref 150–400)
RBC: 2.74 MIL/uL — ABNORMAL LOW (ref 3.87–5.11)
RDW: 19.1 % — ABNORMAL HIGH (ref 11.5–15.5)
WBC: 6.9 K/uL (ref 4.0–10.5)
nRBC: 0 % (ref 0.0–0.2)

## 2024-09-11 LAB — BASIC METABOLIC PANEL WITH GFR
Anion gap: 3 — ABNORMAL LOW (ref 5–15)
BUN: 20 mg/dL (ref 8–23)
CO2: 28 mmol/L (ref 22–32)
Calcium: 8.4 mg/dL — ABNORMAL LOW (ref 8.9–10.3)
Chloride: 109 mmol/L (ref 98–111)
Creatinine, Ser: 1.14 mg/dL — ABNORMAL HIGH (ref 0.44–1.00)
GFR, Estimated: 53 mL/min — ABNORMAL LOW (ref >60)
Glucose, Bld: 107 mg/dL — ABNORMAL HIGH (ref 70–99)
Potassium: 4 mmol/L (ref 3.5–5.1)
Sodium: 140 mmol/L (ref 135–145)

## 2024-09-11 LAB — COOXEMETRY PANEL
Carboxyhemoglobin: 2.4 % — ABNORMAL HIGH (ref 0.5–1.5)
Methemoglobin: 0.9 % (ref 0.0–1.5)
O2 Saturation: 73.4 %
Total hemoglobin: 9.1 g/dL — ABNORMAL LOW (ref 12.0–16.0)

## 2024-09-11 LAB — SURGICAL PCR SCREEN
MRSA, PCR: NEGATIVE
Staphylococcus aureus: NEGATIVE

## 2024-09-11 LAB — HEPARIN LEVEL (UNFRACTIONATED): Heparin Unfractionated: >1.1 [IU]/mL — ABNORMAL HIGH (ref 0.30–0.70)

## 2024-09-11 LAB — APTT: aPTT: 106 s — ABNORMAL HIGH (ref 24–36)

## 2024-09-11 LAB — MAGNESIUM: Magnesium: 2.1 mg/dL (ref 1.7–2.4)

## 2024-09-11 SURGERY — PACEMAKER IMPLANT

## 2024-09-11 MED ORDER — SODIUM BICARBONATE 8.4 % IV SOLN
INTRAVENOUS | Status: DC | PRN
  Administered 2024-09-11: 20 meq via INTRAVENOUS

## 2024-09-11 MED ORDER — SODIUM CHLORIDE 0.9 % IV SOLN
80.0000 mg | INTRAVENOUS | Status: AC
  Filled 2024-09-11: qty 2

## 2024-09-11 MED ORDER — CHLORHEXIDINE GLUCONATE 4 % EX SOLN
60.0000 mL | Freq: Once | CUTANEOUS | Status: DC
  Filled 2024-09-11: qty 60

## 2024-09-11 MED ORDER — FENTANYL CITRATE (PF) 100 MCG/2ML IJ SOLN
INTRAMUSCULAR | Status: AC
  Filled 2024-09-11: qty 2

## 2024-09-11 MED ORDER — SODIUM CHLORIDE 0.9 % IV SOLN: INTRAVENOUS | Status: DC

## 2024-09-11 MED ORDER — TORSEMIDE 20 MG PO TABS: 20.0000 mg | ORAL_TABLET | ORAL | Status: DC

## 2024-09-11 MED ORDER — FENTANYL CITRATE (PF) 100 MCG/2ML IJ SOLN
INTRAMUSCULAR | Status: DC | PRN
  Administered 2024-09-11 (×3): 25 ug via INTRAVENOUS
  Administered 2024-09-11 (×2): 50 ug via INTRAVENOUS

## 2024-09-11 MED ORDER — AMIODARONE HCL 200 MG PO TABS
200.0000 mg | ORAL_TABLET | Freq: Every day | ORAL | Status: DC
  Administered 2024-09-12: 200 mg via ORAL
  Filled 2024-09-11: qty 1

## 2024-09-11 MED ORDER — TORSEMIDE 20 MG PO TABS
20.0000 mg | ORAL_TABLET | ORAL | Status: DC
  Administered 2024-09-11: 20 mg via ORAL
  Filled 2024-09-11: qty 1

## 2024-09-11 MED ORDER — APIXABAN 5 MG PO TABS
5.0000 mg | ORAL_TABLET | Freq: Two times a day (BID) | ORAL | Status: DC
  Administered 2024-09-11 – 2024-09-12 (×2): 5 mg via ORAL
  Filled 2024-09-11: qty 2
  Filled 2024-09-11: qty 1

## 2024-09-11 MED ORDER — IOHEXOL 350 MG/ML SOLN
INTRAVENOUS | Status: DC | PRN
  Administered 2024-09-11: 15 mL

## 2024-09-11 MED ORDER — ACETAMINOPHEN 325 MG PO TABS
975.0000 mg | ORAL_TABLET | Freq: Three times a day (TID) | ORAL | Status: DC
  Administered 2024-09-12: 975 mg via ORAL
  Filled 2024-09-11: qty 3

## 2024-09-11 MED ORDER — MIDAZOLAM HCL 5 MG/5ML IJ SOLN
INTRAMUSCULAR | Status: DC | PRN
  Administered 2024-09-11: 1 mg via INTRAVENOUS
  Administered 2024-09-11 (×2): .5 mg via INTRAVENOUS
  Administered 2024-09-11 (×2): 1 mg via INTRAVENOUS
  Administered 2024-09-11: .5 mg via INTRAVENOUS

## 2024-09-11 MED ORDER — CEFAZOLIN SODIUM-DEXTROSE 2-4 GM/100ML-% IV SOLN
INTRAVENOUS | Status: AC
  Administered 2024-09-11: 2 g via INTRAVENOUS
  Filled 2024-09-11: qty 100

## 2024-09-11 MED ORDER — SODIUM BICARBONATE 8.4 % IV SOLN
INTRAVENOUS | Status: AC
  Filled 2024-09-11: qty 50

## 2024-09-11 MED ORDER — LIDOCAINE HCL 1 % IJ SOLN
INTRAMUSCULAR | Status: AC
  Filled 2024-09-11: qty 60

## 2024-09-11 MED ORDER — SODIUM CHLORIDE 0.9 % IV SOLN
INTRAVENOUS | Status: AC
  Administered 2024-09-11: 80 mg
  Filled 2024-09-11: qty 2

## 2024-09-11 MED ORDER — MIDAZOLAM HCL 5 MG/5ML IJ SOLN
INTRAMUSCULAR | Status: AC
  Filled 2024-09-11: qty 5

## 2024-09-11 MED ORDER — LIDOCAINE HCL (PF) 1 % IJ SOLN
INTRAMUSCULAR | Status: DC | PRN
  Administered 2024-09-11: 60 mL

## 2024-09-11 MED ORDER — CEFAZOLIN SODIUM-DEXTROSE 2-4 GM/100ML-% IV SOLN: 2.0000 g | INTRAVENOUS | Status: AC

## 2024-09-11 SURGICAL SUPPLY — 12 items
CABLE SURGICAL S-101-97-12 (CABLE) ×1 IMPLANT
CATH RIGHTSITE C315HIS02 (CATHETERS) IMPLANT
IPG PACE AZUR XT DR MRI W1DR01 (Pacemaker) IMPLANT
LEAD CAPSURE NOVUS 5076-52CM (Lead) IMPLANT
LEAD SELECT SECURE 3830 383069 (Lead) IMPLANT
PAD DEFIB RADIO PHYSIO CONN (PAD) ×1 IMPLANT
POUCH AIGIS-R ANTIBACT PPM MED (Mesh General) IMPLANT
SHEATH 7FR PRELUDE SNAP 13 (SHEATH) IMPLANT
SHEATH PROBE COVER 6X72 (BAG) IMPLANT
SLITTER 6232ADJ (MISCELLANEOUS) IMPLANT
TRAY PACEMAKER INSERTION (PACKS) ×1 IMPLANT
WIRE HI TORQ VERSACORE-J 145CM (WIRE) IMPLANT

## 2024-09-11 NOTE — Interval H&P Note (Signed)
 History and Physical Interval Note:  09/11/2024 3:16 PM  Katie Jacobson  has presented today for surgery, with the diagnosis of chb.  The various methods of treatment have been discussed with the patient and family. After consideration of risks, benefits and other options for treatment, the patient has consented to  Procedure(s): PACEMAKER IMPLANT (N/A) as a surgical intervention.  The patient's history has been reviewed, patient examined, no change in status, stable for surgery.  I have reviewed the patient's chart and labs.  Questions were answered to the patient's satisfaction.    Also consented for DCCV at same time.    [name]

## 2024-09-11 NOTE — Progress Notes (Signed)
 Occupational Therapy Treatment Patient Details Name: Katie Jacobson MRN: [id number] DOB: 1956-02-20 Today's Date: 09/11/2024   History of present illness Pt is 68 yo presenting to [facility] on 11/17 for scheduled AVR/MVR. Pt pending PPM placement 12/3 afternoon. PMH: breast cancer (triple negative), rheumatic valve disease, severe AI, sever mitral regurgitation, HTN, HLD   OT comments  Pt continues to make good progress with functional goals. No LOB or c/o being light headed during in room ADL and ADL activity. Earlier today, pt c/o fatigue and concern after episode of lightheadedness, which pt reports has resolved. Reviewed energy conservation strategies, sternal precautions; pt pending PPM this afternoon. Pt educated on tub bench for home use and pr agreeable and verbalizes DME need for safety. OT will continue to follow acutely to maximize level of function and safety      If plan is discharge home, recommend the following:      Equipment Recommendations  Other (comment) (defer)    Recommendations for Other Services      Precautions / Restrictions Precautions Precautions: Sternal Precaution Booklet Issued: Yes (comment) Recall of Precautions/Restrictions: Intact Precaution/Restrictions Comments: reviewed sternal pr with pt Restrictions Weight Bearing Restrictions Per Provider Order: Yes RUE Weight Bearing Per Provider Order: Non weight bearing LUE Weight Bearing Per Provider Order: Non weight bearing Other Position/Activity Restrictions: cardiac sternal precautions - reviewed move in the tube and pt able to verbalize and demo understanding       Mobility Bed Mobility               General bed mobility comments: pt in chair    Transfers Overall transfer level: Needs assistance Equipment used: None   Sit to Stand: Supervision                 Balance Overall balance assessment: Needs assistance Sitting-balance support: Feet supported, No upper extremity  supported Sitting balance-Leahy Scale: Good     Standing balance support: No upper extremity supported Standing balance-Leahy Scale: Fair                             ADL either performed or assessed with clinical judgement   ADL       Grooming: Wash/dry hands;Wash/dry face;Standing;Supervision/safety       Lower Body Bathing: Contact guard assist;Supervison/ safety;Sit to/from stand       Lower Body Dressing: Minimal assistance;Sit to/from stand   Toilet Transfer: Retail [employer]er;Ambulation   Toileting- Clothing Manipulation and Hygiene: Supervision/safety;Sit to/from stand       Functional mobility during ADLs: Supervision/safety;Cueing for safety General ADL Comments: reviewed EC techiques    Extremity/Trunk Assessment Upper Extremity Assessment Upper Extremity Assessment: Generalized weakness            Vision Ability to See in Adequate Light: 0 Adequate Patient Visual Report: No change from baseline     Perception     Praxis     Communication Communication Communication: No apparent difficulties   Cognition   Behavior During Therapy: WFL for tasks assessed/performed Cognition: No apparent impairments                               Following commands: Intact        Cueing   Cueing Techniques: Verbal cues  Exercises      Shoulder Instructions       General Comments HR WFL, no dypsnea. BP soft  per recent reading but pt BUE restricted and with bil compression (unna boots) wrappings so not likely to be very accurate if checked on legs in standing. Pt reports no dizziness during session.    Pertinent Vitals/ Pain       Pain Assessment Pain Assessment: No/denies pain Pain Intervention(s): Monitored during session  Home Living                                          Prior Functioning/Environment              Frequency           Progress Toward Goals  OT Goals(current  goals can now be found in the care plan section)  Progress towards OT goals: Progressing toward goals     Plan      Co-evaluation                 AM-PAC OT 6 Clicks Daily Activity     Outcome Measure   Help from another person eating meals?: None Help from another person taking care of personal grooming?: A Little Help from another person toileting, which includes using toliet, bedpan, or urinal?: A Little Help from another person bathing (including washing, rinsing, drying)?: A Little Help from another person to put on and taking off regular upper body clothing?: A Little Help from another person to put on and taking off regular lower body clothing?: A Little 6 Click Score: 19    End of Session    OT Visit Diagnosis: Unsteadiness on feet (R26.81);Other abnormalities of gait and mobility (R26.89);Muscle weakness (generalized) (M62.81)   Activity Tolerance Patient tolerated treatment well   Patient Left with call bell/phone within reach;in chair   Nurse Communication Mobility status        Time: 8843-8787 OT Time Calculation (min): 16 min  Charges: OT General Charges $OT Visit: 1 Visit OT Treatments $Self Care/Home Management : 8-22 mins   Jacques Karna Loose 09/11/2024, 12:51 PM

## 2024-09-11 NOTE — Progress Notes (Addendum)
 Patient ID: Katie Jacobson, female   DOB: 29-Jul-1956, 68 y.o.   MRN: [id number]     Advanced Heart Failure Rounding Note  Cardiologist: Alm Clay, MD  Chief Complaint: CHF Subjective:    POD# 16  Co-ox 72%. Continues in AFL. No complaints today. Denies CP and dyspnea.    Echo 11/29 EF 60% RV ok. Mean AVG 14. Mild TR  Objective:   Weight Range: 98.9 kg Body mass index is 41.2 kg/m.   Vital Signs:   Temp:  [97.6 F (36.4 C)-98.3 F (36.8 C)] 97.8 F (36.6 C) (12/03 0339) Pulse Rate:  [61-72] 61 (12/03 0743) Resp:  [18-23] 18 (12/03 0743) BP: (104-131)/(56-113) 104/56 (12/03 0743) SpO2:  [98 %-100 %] 100 % (12/03 0743) Weight:  [98.9 kg] 98.9 kg (12/03 0339) Last BM Date : 09/10/24  Weight change: Filed Weights   09/09/24 0537 09/10/24 0401 09/11/24 0339  Weight: 97.4 kg 97.8 kg 98.9 kg    Intake/Output:   Intake/Output Summary (Last 24 hours) at 09/11/2024 1021 Last data filed at 09/11/2024 0440 Gross per 24 hour  Intake 235.91 ml  Output --  Net 235.91 ml      Physical Exam   GENERAL: NAD Lungs- clear  CARDIAC:  JVP not elevated         Irregular rhythm and rate. No MRG. No LEE  ABDOMEN: Soft, non-tender, non-distended.  EXTREMITIES: Warm and well perfused.  NEUROLOGIC: No obvious FND   Telemetry   AFL mid 60s, no further CHB. Personally reviewed   Labs    CBC Recent Labs    09/09/24 0343 09/11/24 0510  WBC 6.9 6.9  HGB 8.7* 8.5*  HCT 27.3* 26.9*  MCV 97.5 98.2  PLT 282 279   Basic Metabolic Panel Recent Labs    87/97/74 0550 09/10/24 0953 09/11/24 0510  NA  --  141 140  K  --  4.2 4.0  CL  --  106 109  CO2  --  26 28  GLUCOSE  --  111* 107*  BUN  --  22 20  CREATININE  --  1.14* 1.14*  CALCIUM   --  8.8* 8.4*  MG 2.3  --  2.1   Liver Function Tests No results for input(s): AST, ALT, ALKPHOS, BILITOT, PROT, ALBUMIN  in the last 72 hours. No results for input(s): LIPASE, AMYLASE in the last 72  hours. Cardiac Enzymes No results for input(s): CKTOTAL, CKMB, CKMBINDEX, TROPONINI in the last 72 hours.  BNP: BNP (last 3 results) No results for input(s): BNP in the last 8760 hours.  ProBNP (last 3 results) No results for input(s): PROBNP in the last 8760 hours.   D-Dimer No results for input(s): DDIMER in the last 72 hours. Hemoglobin A1C No results for input(s): HGBA1C in the last 72 hours. Fasting Lipid Panel No results for input(s): CHOL, HDL, LDLCALC, TRIG, CHOLHDL, LDLDIRECT in the last 72 hours. Thyroid  Function Tests No results for input(s): TSH, T4TOTAL, T3FREE, THYROIDAB in the last 72 hours.  Invalid input(s): FREET3  Other results:   Imaging    No results found.      Medications:     Scheduled Medications:  acetaminophen   500 mg Oral QID   aspirin  EC  81 mg Oral Daily   bisacodyl   10 mg Oral Daily   Or   bisacodyl   10 mg Rectal Daily   Chlorhexidine  Gluconate Cloth  6 each Topical Daily   [facility], Patient & Family Education   Does  not apply Once   feeding supplement  237 mL Oral BID BM   lidocaine   1 patch Transdermal Q24H   pantoprazole   40 mg Oral Daily   polyethylene glycol  17 g Oral Daily   senna  1 tablet Oral Daily   sodium chloride  flush  10-40 mL Intracatheter Q12H   sodium chloride  flush  3 mL Intravenous Q12H   sodium chloride  flush  3 mL Intravenous Q12H   spironolactone   25 mg Oral Daily   torsemide   20 mg Oral Q M,W,F    Infusions:  promethazine  (PHENERGAN ) injection (IM or IVPB) Stopped (08/31/24 1154)    PRN Medications: acetaminophen , albuterol , HYDROmorphone  (DILAUDID ) injection, hydrOXYzine , methocarbamol , ondansetron  (ZOFRAN ) IV, mouth rinse, oxyCODONE , polyethylene glycol, promethazine  (PHENERGAN ) injection (IM or IVPB), simethicone , sodium chloride  flush, sodium chloride  flush, sodium chloride  flush    Assessment/Plan   1. Rheumatic heart disease: Severe  MR and severe AI on 9/25 TEE with EF 55-60%. S/p AVR with 21 mm Inspiris valve, MVR with 27 mm Mitris valve and LAA ligation by Dr. Meegan Shanafelt on 08/26/24. Post-op echo (11/20) showed LV EF 65%, mild RV dilation with normal systolic function but there is likely severe tricuspid regurgitation, s/p bioprosthetic MV replacement with mean gradient 4, s/p bioprosthetic AV replacement with mean gradient 20.   - Echo 11/29 EF 60% RV ok. Mean AVG 14. Mild TR 2. Post-op cardiogenic shock/RV failure:  Echo post-op on 11/20 showed LV EF 65%, mild RV dilation with normal systolic function but there is likely severe tricuspid regurgitation, s/p bioprosthetic MV replacement with mean gradient 4, s/p bioprosthetic AV replacement with mean gradient 20.  - Co-ox 72% off milrinone  (stopped 11/29) - Volume status much improved. Euvolemic. Continue Torsemide  20 mg MWF  - Continue Spiro 25 mg daily  - remove PICC for PPM implant  3. Atrial flutter: Atypical, noted post-op. - s/p DC-CV 11/28. -> sinus with CHB - Amio stopped 11/30 with CHB - EP now following. Planning PPM. Can restart amio and plan for DCCV post implant.  - covering w/ heparin  gtt. Transition back to Eliquis  post PPM 4. CHB - now off amio - EP consulted as above. Planning PPM today  4. AKI: - Scr up improved Scr 1.5>>1.1 today  5. Anemia: Post-op. Hgb 8.5 today. Transfuse hgb < 8.  6. Hypokalemia - resolved, K 4.0  - continue spiro  7. Post-op ileus - Resolved    Length of Stay: [address], PA-C  09/11/2024, 10:21 AM  Advanced Heart Failure Team Pager [phone] (M-F; 7a - 5p)  Please contact [facility] for night-coverage after hours (5p -7a ) and weekends on [email or web address]   Patient seen and examined with the above-signed Advanced Practice Provider and/or Housestaff. I personally reviewed laboratory data, imaging studies and relevant notes. I independently examined the patient and formulated the important aspects of the plan. I have  edited the note to reflect any of my changes or salient points. I have personally discussed the plan with the patient and/or family.  Doing well. Co-ox ok.   Remains in AFL. On heparin   Plan for PPM today  General:  Sitting up in bed. No resp difficulty HEENT: normal Neck: supple. no JVD.  Cor: Irregular rate & rhythm. No rubs, gallops or murmurs. Lungs: clear Abdomen: soft, nontender, nondistended.Good bowel sounds. Extremities: no cyanosis, clubbing, rash, edema Neuro: alert & orientedx3, cranial nerves grossly intact. moves all 4 extremities w/o difficulty. Affect pleasant  Plan for PPM today.  Once PPM in can restart amio and plan repeat DCCV for AFL.   Vitals and renal function stable. Continue current HF meds.   Toribio Fuel, MD  10:32 AM

## 2024-09-11 NOTE — H&P (View-Only) (Signed)
 Rounding Note   Patient Name: Katie Jacobson Date of Encounter: 09/11/2024  [facility] Cardiologist: Alm Clay, MD   Subjective  No CP, no SOB, had a fleeting dizzy spell this morning  Scheduled Meds:  acetaminophen   500 mg Oral QID   aspirin  EC  81 mg Oral Daily   bisacodyl   10 mg Oral Daily   Or   bisacodyl   10 mg Rectal Daily   Chlorhexidine  Gluconate Cloth  6 each Topical Daily   [facility], Patient & Family Education   Does not apply Once   feeding supplement  237 mL Oral BID BM   lidocaine   1 patch Transdermal Q24H   pantoprazole   40 mg Oral Daily   polyethylene glycol  17 g Oral Daily   senna  1 tablet Oral Daily   sodium chloride  flush  10-40 mL Intracatheter Q12H   sodium chloride  flush  3 mL Intravenous Q12H   sodium chloride  flush  3 mL Intravenous Q12H   spironolactone   25 mg Oral Daily   torsemide   20 mg Oral Q M,W,F   Continuous Infusions:  promethazine  (PHENERGAN ) injection (IM or IVPB) Stopped (08/31/24 1154)   PRN Meds: acetaminophen , albuterol , HYDROmorphone  (DILAUDID ) injection, hydrOXYzine , methocarbamol , ondansetron  (ZOFRAN ) IV, mouth rinse, oxyCODONE , polyethylene glycol, promethazine  (PHENERGAN ) injection (IM or IVPB), simethicone , sodium chloride  flush, sodium chloride  flush, sodium chloride  flush   Vital Signs  Vitals:   09/10/24 2016 09/10/24 2355 09/11/24 0339 09/11/24 0743  BP: (!) 126/113 117/60 131/63 (!) 104/56  Pulse: 66 61 72 61  Resp: 20 20 20 18   Temp: 97.6 F (36.4 C) 97.8 F (36.6 C) 97.8 F (36.6 C)   TempSrc: Oral Oral Oral   SpO2: 100% 100% 100% 100%  Weight:   98.9 kg   Height:        Intake/Output Summary (Last 24 hours) at 09/11/2024 1038 Last data filed at 09/11/2024 0440 Gross per 24 hour  Intake 235.91 ml  Output --  Net 235.91 ml      09/11/2024    3:39 AM 09/10/2024    4:01 AM 09/09/2024    5:37 AM  Last 3 Weights  Weight (lbs) 218 lb 0.6 oz 215 lb 9.8 oz 214 lb 11.7 oz   Weight (kg) 98.9 kg 97.8 kg 97.4 kg      Telemetry  unchanged AFlutter 70's generally 4:1 conduction - Personally Reviewed  ECG   No new EKGs - Personally Reviewed  Physical Exam  Unchanged from yesterday GEN: No acute distress.   Neck: No JVD Cardiac: RRR, no murmurs, rubs, or gallops.  Respiratory: CTA b/l GI: Soft, nontender, non-distended  MS: unna boots b/l Neuro:  Nonfocal  Psych: Normal affect   Labs High Sensitivity Troponin:  No results for input(s): TROPONINIHS in the last 720 hours.   Chemistry Recent Labs  Lab 09/09/24 0343 09/10/24 0550 09/10/24 0953 09/11/24 0510  NA 139  --  141 140  K 4.0  --  4.2 4.0  CL 105  --  106 109  CO2 30  --  26 28  GLUCOSE 107*  --  111* 107*  BUN 18  --  22 20  CREATININE 1.24*  --  1.14* 1.14*  CALCIUM  8.4*  --  8.8* 8.4*  MG 1.9 2.3  --  2.1  GFRNONAA 48*  --  53* 53*  ANIONGAP 4*  --  9 3*    Lipids No results for input(s): CHOL, TRIG, HDL, LABVLDL,  LDLCALC, CHOLHDL in the last 168 hours.  Hematology Recent Labs  Lab 09/08/24 0448 09/09/24 0343 09/11/24 0510  WBC 6.5 6.9 6.9  RBC 3.00* 2.80* 2.74*  HGB 9.5* 8.7* 8.5*  HCT 28.8* 27.3* 26.9*  MCV 96.0 97.5 98.2  MCH 31.7 31.1 31.0  MCHC 33.0 31.9 31.6  RDW 18.6* 18.5* 19.1*  PLT 281 282 279   Thyroid  No results for input(s): TSH, FREET4 in the last 168 hours.  BNPNo results for input(s): BNP, PROBNP in the last 168 hours.  DDimer No results for input(s): DDIMER in the last 168 hours.   Radiology  No results found.   Cardiac Studies  09/07/24: TTE 1. Left ventricular ejection fraction, by estimation, is 60 to 65%. The  left ventricle has normal function. The left ventricle has no regional  wall motion abnormalities. Left ventricular diastolic function could not  be evaluated.   2. Right ventricular systolic function is normal. The right ventricular  size is normal. There is normal pulmonary artery systolic pressure. The   estimated right ventricular systolic pressure is 31.3 mmHg.   3. The mitral valve has been repaired/replaced. Trivial mitral valve  regurgitation. No evidence of mitral stenosis. The mean mitral valve  gradient is 3.0 mmHg. There is a Mitris Resila present in the mitral  position.   4. The aortic valve has been repaired/replaced. Aortic valve  regurgitation is not visualized. No aortic stenosis is present. There is a  21 mm Insiris Resila valve present in the aortic position. Aortic valve  mean gradient measures 14.0 mmHg. Aortic  valve Vmax measures 2.45 m/s.   5. The inferior vena cava is dilated in size with <50% respiratory  variability, suggesting right atrial pressure of 15 mmHg.   6. Compared to study dated 08/29/2024, the mean AVG has decreased from  to , Vmax has decreased from 3.20m/s to 2.66m/s and DI has  increased from 0.5 to 0.8. There is a normal functioning bioprosthetic MVR  present with no significant change  from prior study. PA paressure has normalized. RAP remains elevated at  .     CARDIAC CATHETERIZATION 07/16/2024   Conclusion Table formatting from the original result was not included. Images from the original result were not included.     Prox LAD to Mid LAD lesion is 50% stenosed.   Hemodynamic findings consistent with mild pulmonary hypertension.  PAP-mean 48/16-29 mmHg with PCWP of 22 to 24 mmHg   There is no aortic valve stenosis.-Unable to fully assess for AI.   There is severe MR by Echo/TEE   In the absence of any other complications or medical issues, we expect the patient to be ready for discharge from a cath perspective on 07/16/2024.   She will proceed with planned T CTS consultation for mitral and aortic valve disease.   Recommend Aspirin  81mg  daily for moderate CAD.   Dominance: Left Angiographically moderate single-vessel disease with segmental 50% calcified stenosis in the mid LAD from SP1-D2 Mild Pulmonary Hypertension  with mean PAP 29 mL of mercury and PCWP of 22 to 24 mmHg -> WHO Class II (due to valvular disease) Due to Valvular Disease, Cardiac Output and Index are mild to moderately reduced.   RECOMMENDATONS   In the absence of any other complications or medical issues, we expect the patient to be ready for discharge from a cath perspective on 07/16/2024.   She will proceed with planned TCTS consultation for mitral and aortic valve disease.   Recommend Aspirin   81mg  daily for moderate CAD.     05/31/24: TTE IMPRESSIONS 1. Moderate to severe mitral regurgitation is now present. No stenosis. Would consider TEE for quantification of MR. The mitral valve is rheumatic. Moderate to severe mitral valve regurgitation. The mean mitral valve gradient is 2.5 mmHg with average heart rate of 63 bpm. 2. Left ventricular ejection fraction, by estimation, is 55 to 60%. The left ventricle has normal function. The left ventricle has no regional wall motion abnormalities. Left ventricular diastolic function could not be evaluated. 3. Right ventricular systolic function is normal. The right ventricular size is mildly enlarged. There is normal pulmonary artery systolic pressure. The estimated right ventricular systolic pressure is 31.3 mmHg. 4. Left atrial size was severely dilated. 5. The aortic valve is tricuspid. Aortic valve regurgitation is moderate. No aortic stenosis is present. 6. The inferior vena cava is normal in size with greater than 50% respiratory variability, suggesting right atrial pressure of 3 mmHg.     Patient Profile   69 y.o. female w/PMHx of  Breast Ca (R lumpectomy, chemo/XRT 2016 >>> 2024 b/l breast CA > left lumpectomy and R mastectomy, chemo and photon tx b/l) Smoker, HTN Rheumatic heart disease   Admitted for: planned MVR/AVR Median Sternotomy Extracorporeal circulation Femoral venous cannulation 4.   Aortic valve replacement using a 21 mm Inspiris valve 5.   Mitral valve replacement using  a 27 mm Mitris valve 6.   Left atrial appendage ligation  Post op course c/b cardiogenic shock AFlutter, SB/junctional , alternating BBB Now off amiodarone  EP engaged to consider PPM  Assessment & Plan   Rheumatic heart disease S/p AVR/MVR (both bioprosthetic) 08/26/24 Complicated post-op course   3.   Conduction system disease post op She has been seen w/variable IVCD, RBBB, LBBB and CHB Escape appears junctional w/narrow QRS, rates 70's 4.    Post-op AFlutter Amiodarone  gtt > 1week transitioned to PO 11/30 > stopped 11/30 -- plan to resume OAC post PPM pending pocket stability -- rhythm control options would be TEE/DCCV after 3 days back on OAC -- or-- DCCV/AAD once back on uninterrupted a/c for 3 weeks    11/29 TTE LVEF remains preserved, RV ok, TR better MVR and AVR functioning well  She is s/p R mastectomy (and remote lumpectomy) She is s/p L lumpectomy and has had port on the left (removed) She has had b/l XRT   Patient ready to pursue PPM today Dr. Almetta has been bedside this morning, she had no procedural follow up questions for me, remains agreeable to proceed. Heparin  was off at 0817 this morning Escape rhythm has narrow QRS Preserved LVEF  She had a fleeting dizzy spell this morning, no brady/pause, tachy episodes noted, stable AFlutter with CVR. Unclear etiology, mentions she is very hungry, despite eating her clears breakfast. D/w RN, OK  to have clears/juice up to 1100   For questions or updates, please contact [facility] Please consult [email or web address] for contact info under    Signed, Katie Macario Arthur, PA-C  09/11/2024, 10:38 AM

## 2024-09-11 NOTE — Progress Notes (Signed)
 Physical Therapy Treatment Patient Details Name: Katie Jacobson MRN: [id number] DOB: 1956-07-22 Today's Date: 09/11/2024   History of Present Illness Pt is 68 yo presenting to [facility] on 11/17 for scheduled AVR/MVR. Pt pending PPM placement 12/3 afternoon. PMH: breast cancer (triple negative), rheumatic valve disease, severe AI, sever mitral regurgitation, HTN, HLD    PT Comments  Pt received up in room at sink, c/o fatigue and concern after episode of lightheadedness earlier in day, which pt reports has resolved. Pt defers hallway ambulation with rollator but agreeable to stair negotiation instruction with BUE support and single 7 step in room. Pt able to ascend with either limb and descend with both legs without increased c/o pain or instability. Reviewed fall risk prevention post-op including AD use for safety even if pt not always lightheaded, that this can occur unexpectedly given medication changes/edema and pt current NPO status, pt agreeable to use AD in room today after discussion. Pt Supervision for transfers/gait and CGA (+2 safety) for stairs with BUE support. PTA reviewed some of LUE likely precs after PPM implant, but do not have updated handout so plan to bring PPM precs handout next session, pt tending placement of device this afternoon. Pt continues to benefit from PT services to progress toward functional mobility goals.     If plan is discharge home, recommend the following: Assist for transportation;Help with stairs or ramp for entrance;Assistance with cooking/housework;A little help with walking and/or transfers;A little help with bathing/dressing/bathroom   Can travel by private vehicle        Equipment Recommendations  Other (comment) (Rollator and 3in1 already delivered to her room)    Recommendations for Other Services       Precautions / Restrictions Precautions Precautions: Sternal Precaution Booklet Issued: Yes (comment) Recall of Precautions/Restrictions:  Intact Precaution/Restrictions Comments: handout brought to room after session as pt states I don't know what you mean when PTA referred to moving in the tube Restrictions Weight Bearing Restrictions Per Provider Order: Yes Other Position/Activity Restrictions: cardiac sternal precautions - reviewed move in the tube and pt able to explain this functionally     Mobility  Bed Mobility               General bed mobility comments: pt received up at sink; sitting in chair at end of session    Transfers Overall transfer level: Needs assistance Equipment used: None Transfers: Sit to/from Stand Sit to Stand: Supervision           General transfer comment: cues for precs and pt compliant; also reviewed that later today after PPM implant, she will have LUE precs and no pushing/pulling to get up/down, pt receptive; per pt it will not be a leadless pacemaker but will defer handout given until after device implanted to make sure pt has proper info    Ambulation/Gait Ambulation/Gait assistance: Supervision Gait Distance (Feet): 20 Feet Assistive device: None Gait Pattern/deviations: Step-through pattern, Decreased stride length, Trunk flexed, Wide base of support       General Gait Details: pt defers RW use in room (received standing at sink without device), pt reporting earlier episode of lightheadedness/possible near-syncopal episode but reports currently not feeling lightheaded. Pt agreeable to use RW in room after discussion but defers additional ambulation after stair training due to fatigue and upcoming procedure. PTA adjusted new rollator handle height for her to lower setting for better compliance with sternal precs.   Stairs Stairs: Yes Stairs assistance: Contact guard assist, +2 safety/equipment Stair Management:  Step to pattern, Backwards, Forwards, With walker Number of Stairs: 6 (single 7 platform x6 reps) General stair comments: 7 platform step in room x3 reps  leading with RLE, then 3 reps leading with LLE, BUE supported by RW, CGA +2 for safety due to pt anxiety and report of recent lightheaded episode, but no buckling today. CGA/guarding at each knee for safety while stepping up but did not need lift assist today.   Wheelchair Mobility     Tilt Bed    Modified Rankin (Stroke Patients Only)       Balance Overall balance assessment: Needs assistance Sitting-balance support: Feet supported, No upper extremity supported Sitting balance-Leahy Scale: Good     Standing balance support: No upper extremity supported Standing balance-Leahy Scale: Fair Standing balance comment: no buckling or LOB                            Communication Communication Communication: No apparent difficulties  Cognition Arousal: Alert Behavior During Therapy: WFL for tasks assessed/performed   PT - Cognitive impairments: No apparent impairments                       PT - Cognition Comments: tangential, pt reluctant due to c/o woozy episode earlier in AM, and defers BP assessment PTA recommended, but agreeable to stair training in room with encouragement and instruction on sternal precs/handout and PPM precs (pending PPM implant later today) Following commands: Intact      Cueing Cueing Techniques: Verbal cues, Gestural cues  Exercises Other Exercises Other Exercises: pt given handout for supine and seated LE/UE exercises after sternotomy, pt to review and plan to have pt demo back next session in case of questions    General Comments General comments (skin integrity, edema, etc.): HR WFL, no dypsnea. BP soft per recent reading but pt BUE restricted and with bil compression (unna boots) wrappings so not likely to be very accurate if checked on legs in standing. Pt reports no dizziness during session.      Pertinent Vitals/Pain Pain Assessment Pain Assessment: No/denies pain Faces Pain Scale: Hurts a little bit Pain Location:  incisional Pain Descriptors / Indicators: Grimacing, Guarding Pain Intervention(s): Monitored during session, Repositioned    Home Living                          Prior Function            PT Goals (current goals can now be found in the care plan section) Acute Rehab PT Goals Patient Stated Goal: to improve mobility/decrease pain PT Goal Formulation: With patient Time For Goal Achievement: 09/24/24 Progress towards PT goals: Progressing toward goals    Frequency    Min 2X/week      PT Plan      Co-evaluation              AM-PAC PT 6 Clicks Mobility   Outcome Measure  Help needed turning from your back to your side while in a flat bed without using bedrails?: A Little Help needed moving from lying on your back to sitting on the side of a flat bed without using bedrails?: A Little Help needed moving to and from a bed to a chair (including a wheelchair)?: A Little Help needed standing up from a chair using your arms (e.g., wheelchair or bedside chair)?: A Little Help needed to walk in hospital room?:  A Little Help needed climbing 3-5 steps with a railing? : A Little 6 Click Score: 18    End of Session   Activity Tolerance: Patient limited by fatigue Patient left: in chair;with call bell/phone within reach (RN aware pt chair alarm off, per pt she has not had it on) Nurse Communication: Mobility status PT Visit Diagnosis: Unsteadiness on feet (R26.81);Other abnormalities of gait and mobility (R26.89);Muscle weakness (generalized) (M62.81);Pain;Difficulty in walking, not elsewhere classified (R26.2)     Time: 1105-1120 PT Time Calculation (min) (ACUTE ONLY): 15 min  Charges:    $Gait Training: 8-22 mins PT General Charges $$ ACUTE PT VISIT: 1 Visit                     Canesha Tesfaye P., PTA Acute Rehabilitation Services Secure Chat Preferred 9a-5:30pm Office: [phone]    Connell HERO [facility] 09/11/2024, 11:40 AM

## 2024-09-11 NOTE — Progress Notes (Signed)
 Rounding Note   Patient Name: Katie Jacobson Date of Encounter: 09/11/2024  [facility] Cardiologist: Alm Clay, MD   Subjective  No CP, no SOB, had a fleeting dizzy spell this morning  Scheduled Meds:  acetaminophen   500 mg Oral QID   aspirin  EC  81 mg Oral Daily   bisacodyl   10 mg Oral Daily   Or   bisacodyl   10 mg Rectal Daily   Chlorhexidine  Gluconate Cloth  6 each Topical Daily   [facility], Patient & Family Education   Does not apply Once   feeding supplement  237 mL Oral BID BM   lidocaine   1 patch Transdermal Q24H   pantoprazole   40 mg Oral Daily   polyethylene glycol  17 g Oral Daily   senna  1 tablet Oral Daily   sodium chloride  flush  10-40 mL Intracatheter Q12H   sodium chloride  flush  3 mL Intravenous Q12H   sodium chloride  flush  3 mL Intravenous Q12H   spironolactone   25 mg Oral Daily   torsemide   20 mg Oral Q M,W,F   Continuous Infusions:  promethazine  (PHENERGAN ) injection (IM or IVPB) Stopped (08/31/24 1154)   PRN Meds: acetaminophen , albuterol , HYDROmorphone  (DILAUDID ) injection, hydrOXYzine , methocarbamol , ondansetron  (ZOFRAN ) IV, mouth rinse, oxyCODONE , polyethylene glycol, promethazine  (PHENERGAN ) injection (IM or IVPB), simethicone , sodium chloride  flush, sodium chloride  flush, sodium chloride  flush   Vital Signs  Vitals:   09/10/24 2016 09/10/24 2355 09/11/24 0339 09/11/24 0743  BP: (!) 126/113 117/60 131/63 (!) 104/56  Pulse: 66 61 72 61  Resp: 20 20 20 18   Temp: 97.6 F (36.4 C) 97.8 F (36.6 C) 97.8 F (36.6 C)   TempSrc: Oral Oral Oral   SpO2: 100% 100% 100% 100%  Weight:   98.9 kg   Height:        Intake/Output Summary (Last 24 hours) at 09/11/2024 1038 Last data filed at 09/11/2024 0440 Gross per 24 hour  Intake 235.91 ml  Output --  Net 235.91 ml      09/11/2024    3:39 AM 09/10/2024    4:01 AM 09/09/2024    5:37 AM  Last 3 Weights  Weight (lbs) 218 lb 0.6 oz 215 lb 9.8 oz 214 lb 11.7 oz   Weight (kg) 98.9 kg 97.8 kg 97.4 kg      Telemetry  unchanged AFlutter 70's generally 4:1 conduction - Personally Reviewed  ECG   No new EKGs - Personally Reviewed  Physical Exam  Unchanged from yesterday GEN: No acute distress.   Neck: No JVD Cardiac: RRR, no murmurs, rubs, or gallops.  Respiratory: CTA b/l GI: Soft, nontender, non-distended  MS: unna boots b/l Neuro:  Nonfocal  Psych: Normal affect   Labs High Sensitivity Troponin:  No results for input(s): TROPONINIHS in the last 720 hours.   Chemistry Recent Labs  Lab 09/09/24 0343 09/10/24 0550 09/10/24 0953 09/11/24 0510  NA 139  --  141 140  K 4.0  --  4.2 4.0  CL 105  --  106 109  CO2 30  --  26 28  GLUCOSE 107*  --  111* 107*  BUN 18  --  22 20  CREATININE 1.24*  --  1.14* 1.14*  CALCIUM  8.4*  --  8.8* 8.4*  MG 1.9 2.3  --  2.1  GFRNONAA 48*  --  53* 53*  ANIONGAP 4*  --  9 3*    Lipids No results for input(s): CHOL, TRIG, HDL, LABVLDL,  LDLCALC, CHOLHDL in the last 168 hours.  Hematology Recent Labs  Lab 09/08/24 0448 09/09/24 0343 09/11/24 0510  WBC 6.5 6.9 6.9  RBC 3.00* 2.80* 2.74*  HGB 9.5* 8.7* 8.5*  HCT 28.8* 27.3* 26.9*  MCV 96.0 97.5 98.2  MCH 31.7 31.1 31.0  MCHC 33.0 31.9 31.6  RDW 18.6* 18.5* 19.1*  PLT 281 282 279   Thyroid  No results for input(s): TSH, FREET4 in the last 168 hours.  BNPNo results for input(s): BNP, PROBNP in the last 168 hours.  DDimer No results for input(s): DDIMER in the last 168 hours.   Radiology  No results found.   Cardiac Studies  09/07/24: TTE 1. Left ventricular ejection fraction, by estimation, is 60 to 65%. The  left ventricle has normal function. The left ventricle has no regional  wall motion abnormalities. Left ventricular diastolic function could not  be evaluated.   2. Right ventricular systolic function is normal. The right ventricular  size is normal. There is normal pulmonary artery systolic pressure. The   estimated right ventricular systolic pressure is 31.3 mmHg.   3. The mitral valve has been repaired/replaced. Trivial mitral valve  regurgitation. No evidence of mitral stenosis. The mean mitral valve  gradient is 3.0 mmHg. There is a Mitris Resila present in the mitral  position.   4. The aortic valve has been repaired/replaced. Aortic valve  regurgitation is not visualized. No aortic stenosis is present. There is a  21 mm Insiris Resila valve present in the aortic position. Aortic valve  mean gradient measures 14.0 mmHg. Aortic  valve Vmax measures 2.45 m/s.   5. The inferior vena cava is dilated in size with <50% respiratory  variability, suggesting right atrial pressure of 15 mmHg.   6. Compared to study dated 08/29/2024, the mean AVG has decreased from  to , Vmax has decreased from 3.20m/s to 2.66m/s and DI has  increased from 0.5 to 0.8. There is a normal functioning bioprosthetic MVR  present with no significant change  from prior study. PA paressure has normalized. RAP remains elevated at  .     CARDIAC CATHETERIZATION 07/16/2024   Conclusion Table formatting from the original result was not included. Images from the original result were not included.     Prox LAD to Mid LAD lesion is 50% stenosed.   Hemodynamic findings consistent with mild pulmonary hypertension.  PAP-mean 48/16-29 mmHg with PCWP of 22 to 24 mmHg   There is no aortic valve stenosis.-Unable to fully assess for AI.   There is severe MR by Echo/TEE   In the absence of any other complications or medical issues, we expect the patient to be ready for discharge from a cath perspective on 07/16/2024.   She will proceed with planned T CTS consultation for mitral and aortic valve disease.   Recommend Aspirin  81mg  daily for moderate CAD.   Dominance: Left Angiographically moderate single-vessel disease with segmental 50% calcified stenosis in the mid LAD from SP1-D2 Mild Pulmonary Hypertension  with mean PAP 29 mL of mercury and PCWP of 22 to 24 mmHg -> WHO Class II (due to valvular disease) Due to Valvular Disease, Cardiac Output and Index are mild to moderately reduced.   RECOMMENDATONS   In the absence of any other complications or medical issues, we expect the patient to be ready for discharge from a cath perspective on 07/16/2024.   She will proceed with planned TCTS consultation for mitral and aortic valve disease.   Recommend Aspirin   81mg  daily for moderate CAD.     05/31/24: TTE IMPRESSIONS 1. Moderate to severe mitral regurgitation is now present. No stenosis. Would consider TEE for quantification of MR. The mitral valve is rheumatic. Moderate to severe mitral valve regurgitation. The mean mitral valve gradient is 2.5 mmHg with average heart rate of 63 bpm. 2. Left ventricular ejection fraction, by estimation, is 55 to 60%. The left ventricle has normal function. The left ventricle has no regional wall motion abnormalities. Left ventricular diastolic function could not be evaluated. 3. Right ventricular systolic function is normal. The right ventricular size is mildly enlarged. There is normal pulmonary artery systolic pressure. The estimated right ventricular systolic pressure is 31.3 mmHg. 4. Left atrial size was severely dilated. 5. The aortic valve is tricuspid. Aortic valve regurgitation is moderate. No aortic stenosis is present. 6. The inferior vena cava is normal in size with greater than 50% respiratory variability, suggesting right atrial pressure of 3 mmHg.     Patient Profile   68 y.o. female w/PMHx of  Breast Ca (R lumpectomy, chemo/XRT 2016 >>> 2024 b/l breast CA > left lumpectomy and R mastectomy, chemo and photon tx b/l) Smoker, HTN Rheumatic heart disease   Admitted for: planned MVR/AVR Median Sternotomy Extracorporeal circulation Femoral venous cannulation 4.   Aortic valve replacement using a 21 mm Inspiris valve 5.   Mitral valve replacement using  a 27 mm Mitris valve 6.   Left atrial appendage ligation  Post op course c/b cardiogenic shock AFlutter, SB/junctional , alternating BBB Now off amiodarone  EP engaged to consider PPM  Assessment & Plan   Rheumatic heart disease S/p AVR/MVR (both bioprosthetic) 08/26/24 Complicated post-op course   3.   Conduction system disease post op She has been seen w/variable IVCD, RBBB, LBBB and CHB Escape appears junctional w/narrow QRS, rates 70's 4.    Post-op AFlutter Amiodarone  gtt > 1week transitioned to PO 11/30 > stopped 11/30 -- plan to resume OAC post PPM pending pocket stability -- rhythm control options would be TEE/DCCV after 3 days back on OAC -- or-- DCCV/AAD once back on uninterrupted a/c for 3 weeks    11/29 TTE LVEF remains preserved, RV ok, TR better MVR and AVR functioning well  She is s/p R mastectomy (and remote lumpectomy) She is s/p L lumpectomy and has had port on the left (removed) She has had b/l XRT   Patient ready to pursue PPM today Dr. Almetta has been bedside this morning, she had no procedural follow up questions for me, remains agreeable to proceed. Heparin  was off at 0817 this morning Escape rhythm has narrow QRS Preserved LVEF  She had a fleeting dizzy spell this morning, no brady/pause, tachy episodes noted, stable AFlutter with CVR. Unclear etiology, mentions she is very hungry, despite eating her clears breakfast. D/w RN, OK  to have clears/juice up to 1100   For questions or updates, please contact [facility] Please consult [email or web address] for contact info under    Signed, Charlies Macario Arthur, PA-C  09/11/2024, 10:38 AM

## 2024-09-11 NOTE — Progress Notes (Signed)
 PHARMACY - ANTICOAGULATION CONSULT NOTE  Pharmacy Consult for heparin  Indication: atrial fibrillation  Allergies  Allergen Reactions   Sulfa Antibiotics Other (See Comments)    UNKNOWN    Patient Measurements: Height: 5' 1 (154.9 cm) Weight: 98.9 kg (218 lb 0.6 oz) IBW/kg (Calculated) : 47.8 HEPARIN  DW (KG): (P) 71.1  Vital Signs: Temp: 97.8 F (36.6 C) (12/03 0339) Temp Source: Oral (12/03 0339) BP: 131/63 (12/03 0339) Pulse Rate: 72 (12/03 0339)  Labs: Recent Labs    09/09/24 0343 09/10/24 0953 09/10/24 1745 09/10/24 2025 09/11/24 0500 09/11/24 0510  HGB 8.7*  --   --   --   --  8.5*  HCT 27.3*  --   --   --   --  26.9*  PLT 282  --   --   --   --  279  APTT  --   --  >200* 80*  --  106*  HEPARINUNFRC  --   --   --   --  >1.10*  --   CREATININE 1.24* 1.14*  --   --   --  1.14*    Estimated Creatinine Clearance: 53 mL/min (A) (by C-G formula based on SCr of 1.14 mg/dL (H)).   Medical History: Past Medical History:  Diagnosis Date   Allergy    Arthritis    Breast cancer of upper-outer quadrant of right female breast (HCC) 11/14/2014   Treated with chemotherapy and radiation (diagnosed again in March 2024 in both breasts)   Heart murmur    History of chemotherapy    finished chemo 02/03/2015   History of kidney stones    History of seizures    as a child - unknown cause - states was never on anticonvulsants   Hypertension    states under control with meds., has been on med. x years   Mitral regurgitation    Mitral valve prolapse 1990   Rheumatic mitral valve with moderate to severe MR by echo 05/31/2024 => TEE suggests rheumatic mitral valve with severe calcified leaflets and severe mitral regurgitation but also severe aortic regurgitation   Osteoporosis    Personal history of chemotherapy    Personal history of radiation therapy    Port-A-Cath in place 01/24/2023   Seasonal allergies       Assessment: 67 yoF s/p bAVR/MVR 11/17. Pt has had AF  s/p DCCV 11/28. Pt on apixaban , now to transition to IV heparin  with need for PPM this admit. LD apixaban  12/1 pm.  Heparin  level >1.1 due to DOAC, aPTT is slightly above goal at 106 seconds. CBC ok.  Goal of Therapy:  Heparin  level 0.3-0.7 units/ml aPTT 66-102 seconds Monitor platelets by anticoagulation protocol: Yes   Plan:  -Reduce heparin  to 1300 units/h -Daily aPTT, heparin  level, CBC  Ozell Jamaica, PharmD, BCPS, [facility] Clinical Pharmacist [phone] Please check AMION for all [facility] numbers 09/11/2024

## 2024-09-11 NOTE — Progress Notes (Signed)
 PICC Removal Note: PICC line removed from LUE per MD order. PICC catheter tip visualized and intact. Pressure held and pressure dressing applied. No redness, ecchymosis, edema, swelling, or drainage noted at site. Instructions provided on post PICC discharge care, including followup notification instructions.  Bedrest until 1005.

## 2024-09-11 NOTE — Progress Notes (Addendum)
 [address], [place] [employer] [address]             [phone]  16 Days Post-Op Procedure(s) (LRB): REPLACEMENT, AORTIC VALVE, OPEN, UTILIZING INSPIRIS RESILIA  AORTIC VALVE (N/A) REPLACEMENT, MITRAL VALVE, UTILIZING MITRIS RESILIA MITRAL VALVE (N/A) CLIPPING, LEFT ATRIAL APPENDAGE UTILIZING ATRICURE PRO 40 (N/A) ECHOCARDIOGRAM, TRANSESOPHAGEAL, INTRAOPERATIVE (N/A) Subjective:  Feels OK, no new problems. Said she is ready to proceed with PPM whenever it can be arranged.  She is currently NPO, on Heparin  infusion.   CoOx 56% yesterday -->73% today.    Objective: Vital signs in last 24 hours: Temp:  [97.6 F (36.4 C)-98.3 F (36.8 C)] 97.8 F (36.6 C) (12/03 0339) Pulse Rate:  [61-115] 72 (12/03 0339) Cardiac Rhythm: Atrial flutter (12/02 2016) Resp:  [20-23] 20 (12/03 0339) BP: (111-131)/(60-113) 131/63 (12/03 0339) SpO2:  [97 %-100 %] 100 % (12/03 0339) Weight:  [98.9 kg] 98.9 kg (12/03 0339)    Intake/Output from previous day: 12/02 0701 - 12/03 0700 In: 235.9 [I.V.:235.9] Out: -  Intake/Output this shift: No intake/output data recorded.  General appearance: alert, cooperative, and no distress Neurologic: intact  Heart: Atrial flutter, VR 65-70/min Lungs: normal work of breathing on RA, breath sounds clear.  Abdomen: soft, no tenderness Extremities:  Unna boots to both LE's ( changed 12/1) Wound: the sternotomy incision is intact and healing well.  Lab Results: Recent Labs    09/09/24 0343 09/11/24 0510  WBC 6.9 6.9  HGB 8.7* 8.5*  HCT 27.3* 26.9*  PLT 282 279   BMET:  Recent Labs    09/10/24 0953 09/11/24 0510  NA 141 140  K 4.2 4.0  CL 106 109  CO2 26 28  GLUCOSE 111* 107*  BUN 22 20  CREATININE 1.14* 1.14*  CALCIUM  8.8* 8.4*    PT/INR: No results for input(s): LABPROT, INR in the last 72 hours. ABG    Component Value Date/Time   PHART 7.415 08/29/2024 0105   HCO3 22.7 08/29/2024 0105   TCO2 24  08/29/2024 0105   ACIDBASEDEF 2.0 08/29/2024 0105   O2SAT 73.4 09/11/2024 0510   CBG (last 3)  No results for input(s): GLUCAP in the last 72 hours.  Assessment/Plan: S/P Procedure(s) (LRB): REPLACEMENT, AORTIC VALVE, OPEN, UTILIZING INSPIRIS RESILIA  AORTIC VALVE (N/A) REPLACEMENT, MITRAL VALVE, UTILIZING MITRIS RESILIA MITRAL VALVE (N/A) CLIPPING, LEFT ATRIAL APPENDAGE UTILIZING ATRICURE PRO 40 (N/A) ECHOCARDIOGRAM, TRANSESOPHAGEAL, INTRAOPERATIVE (N/A)  -POD16 bioprosthetic AVR and MVR for severe rheumatic AI and MR with left atrial appendage ligation. Post-op cardiogenic shock requiring inotropic support for several days in the ICU. CoOx improved to 73% today if accurate. On ASA, spiro.  -Post-op atrial fibrillation / flutter. S/P successful DCCV on 11/28 but rhythm degraded to CHB and now back in a-flutter. EP / HF  recommend PPM and she now agrees to proceed.  K+ 4.0,  Mg++ 2.1.   Eliquis  on hold for planned PPM.    -PULM- good oxygenation on RA.  Small right effusion on CXR, will follow up tomorrow.    -GI-post-op ileus resolved. Appetite improved.  -RENAL- AKI post-op creat near baseline. Plan to dose Torsemide  every other day for now.   -HEME- expected ABL anemia.  Hct has been stable, monitoring.   -Disposition- She is progressing with mobility and transfers. PT recommending HH PT at discharge with rolling walker and BSC. Plan for PPM at discretion of EP.      LOS:  16 days    Katie JUDITHANN Becket, PA-C 09/11/2024  Doing well this morning.  Prepared to proceed with PPM, has been on clear liquids.  Cleared for home by PT and has been up and ambulating without issues.  She is in good spirits.  Dispo planning pending PPM.  Con Clunes, MD Cardiothoracic Surgery Pager: [phone]

## 2024-09-12 ENCOUNTER — Inpatient Hospital Stay (HOSPITAL_COMMUNITY)

## 2024-09-12 ENCOUNTER — Other Ambulatory Visit (HOSPITAL_COMMUNITY): Payer: Self-pay

## 2024-09-12 ENCOUNTER — Telehealth (HOSPITAL_COMMUNITY): Payer: Self-pay | Admitting: Cardiology

## 2024-09-12 DIAGNOSIS — I442 Atrioventricular block, complete: Secondary | ICD-10-CM | POA: Diagnosis not present

## 2024-09-12 DIAGNOSIS — I4892 Unspecified atrial flutter: Secondary | ICD-10-CM | POA: Diagnosis not present

## 2024-09-12 DIAGNOSIS — Z9889 Other specified postprocedural states: Secondary | ICD-10-CM | POA: Diagnosis not present

## 2024-09-12 DIAGNOSIS — Z952 Presence of prosthetic heart valve: Secondary | ICD-10-CM | POA: Diagnosis not present

## 2024-09-12 LAB — BASIC METABOLIC PANEL WITH GFR
Anion gap: 9 (ref 5–15)
BUN: 15 mg/dL (ref 8–23)
CO2: 25 mmol/L (ref 22–32)
Calcium: 8.5 mg/dL — ABNORMAL LOW (ref 8.9–10.3)
Chloride: 106 mmol/L (ref 98–111)
Creatinine, Ser: 1.18 mg/dL — ABNORMAL HIGH (ref 0.44–1.00)
GFR, Estimated: 51 mL/min — ABNORMAL LOW (ref >60)
Glucose, Bld: 126 mg/dL — ABNORMAL HIGH (ref 70–99)
Potassium: 4.1 mmol/L (ref 3.5–5.1)
Sodium: 140 mmol/L (ref 135–145)

## 2024-09-12 LAB — CBC
HCT: 27.1 % — ABNORMAL LOW (ref 36.0–46.0)
Hemoglobin: 9 g/dL — ABNORMAL LOW (ref 12.0–15.0)
MCH: 31.8 pg (ref 26.0–34.0)
MCHC: 33.2 g/dL (ref 30.0–36.0)
MCV: 95.8 fL (ref 80.0–100.0)
Platelets: 279 K/uL (ref 150–400)
RBC: 2.83 MIL/uL — ABNORMAL LOW (ref 3.87–5.11)
RDW: 19 % — ABNORMAL HIGH (ref 11.5–15.5)
WBC: 6.7 K/uL (ref 4.0–10.5)
nRBC: 0 % (ref 0.0–0.2)

## 2024-09-12 LAB — APTT: aPTT: 33 s (ref 24–36)

## 2024-09-12 LAB — MAGNESIUM: Magnesium: 2.2 mg/dL (ref 1.7–2.4)

## 2024-09-12 MED ORDER — METHOCARBAMOL 500 MG PO TABS
500.0000 mg | ORAL_TABLET | Freq: Three times a day (TID) | ORAL | 1 refills | Status: DC | PRN
  Filled 2024-09-12: qty 15, 5d supply, fill #0

## 2024-09-12 MED ORDER — ACETAMINOPHEN 325 MG PO TABS: 650.0000 mg | ORAL_TABLET | Freq: Four times a day (QID) | ORAL | Status: AC | PRN

## 2024-09-12 MED ORDER — TORSEMIDE 20 MG PO TABS
20.0000 mg | ORAL_TABLET | ORAL | 1 refills | Status: DC
  Filled 2024-09-12: qty 30, 70d supply, fill #0

## 2024-09-12 MED ORDER — AMIODARONE HCL 200 MG PO TABS
200.0000 mg | ORAL_TABLET | Freq: Every day | ORAL | 2 refills | Status: DC
  Filled 2024-09-12: qty 30, 30d supply, fill #0

## 2024-09-12 MED ORDER — APIXABAN 5 MG PO TABS
5.0000 mg | ORAL_TABLET | Freq: Two times a day (BID) | ORAL | 5 refills | Status: DC
  Filled 2024-09-12: qty 60, 30d supply, fill #0

## 2024-09-12 MED ORDER — OXYCODONE HCL 5 MG PO TABS
5.0000 mg | ORAL_TABLET | ORAL | 0 refills | Status: DC | PRN
  Filled 2024-09-12: qty 30, 5d supply, fill #0

## 2024-09-12 MED ORDER — SPIRONOLACTONE 25 MG PO TABS
25.0000 mg | ORAL_TABLET | Freq: Every day | ORAL | 2 refills | Status: DC
  Filled 2024-09-12: qty 30, 30d supply, fill #0

## 2024-09-12 NOTE — Progress Notes (Addendum)
 Discharge   Patient expressed verbal understanding of discharge POC.   Patient given time to ask any questions.  Additional education included in AVS.  Alert oriented in good spirits.   Tele and PIV removed.  Pressure dressings intact.  CCMD called by Primary RN .   TOC meds ready Spouse on the way with patients clothing. Patient reports all DME:  BSC and Rollator have been sent home with family.

## 2024-09-12 NOTE — TOC Transition Note (Signed)
 Transition of Care (TOC) - Discharge Note Rayfield Gobble RN, BSN Inpatient Care Management Unit 4E- RN Case Manager See Treatment Team for direct phone #   Patient Details  Name: Katie Jacobson MRN: [id number] Date of Birth: Feb 11, 1956  Transition of Care [facility]) CM/SW Contact:  Gobble Rayfield Hurst, RN Phone Number: 09/12/2024, 10:31 AM   Clinical Narrative:    Pt stable for transition home today, per previous CM note DME arranged w/ [employer] and rollator/BCS have been delivered to the room for home.   HH arranged with [facility] under TCTS office protocol (epic order for HHPT)- pt is agreeable to this arrangement per previous CM note. [facility] liaison updated and to follow for start of care w/ office protocol.  Family to transport home  IP CM interventions have been completed no further needs noted.   Final next level of care: Home w Home Health Services Barriers to Discharge: No Barriers Identified   Patient Goals and CMS Choice Patient states their goals for this hospitalization and ongoing recovery are:: wants to recover CMS Medicare.gov Compare Post Acute Care list provided to:: Patient Choice offered to / list presented to : Patient      Discharge Placement               Home w/ [facility]        Discharge Plan and Services Additional resources added to the After Visit Summary for     Discharge Planning Services: CM Consult Post Acute Care Choice: Durable Medical Equipment, Home Health          DME Arranged: 3-N-1, Walker rolling with seat DME Agency: [facility] Date DME Agency Contacted: 09/10/24 Time DME Agency Contacted: 1225 Representative spoke with at DME Agency: [facility] Arranged: RN, PT [facility] Agency: [facility] ([facility] Date HH Agency Contacted: 09/03/24 Time HH Agency Contacted: 1533 Representative spoke with at [facility] Agency: Zebedee Braver  Social Drivers of Health (SDOH) Interventions SDOH Screenings   Food Insecurity: Unknown  (08/28/2024)  Housing: Low Risk  (08/28/2024)  Transportation Needs: No Transportation Needs (08/29/2024)  Utilities: Not At Risk (08/29/2024)  Depression (PHQ2-9): Low Risk  (12/27/2023)  Social Connections: Unknown (08/28/2024)  Tobacco Use: High Risk (09/11/2024)     Readmission Risk Interventions    09/12/2024   10:31 AM  Readmission Risk Prevention Plan  Transportation Screening Complete  PCP or Specialist Appt within 3-5 Days Complete  HRI or Home Care Consult Complete  Social Work Consult for Recovery Care Planning/Counseling Complete  Palliative Care Screening Not Applicable  Medication Review Oceanographer) Complete

## 2024-09-12 NOTE — Progress Notes (Signed)
 Rounding Note   Patient Name: Katie Jacobson Date of Encounter: 09/12/2024  [facility] Cardiologist: Alm Clay, MD   Subjective  No CP, no SOB, minimal implant sie discomfort  Scheduled Meds:  acetaminophen   975 mg Oral TID   amiodarone   200 mg Oral Daily   apixaban   5 mg Oral BID   aspirin  EC  81 mg Oral Daily   bisacodyl   10 mg Oral Daily   Or   bisacodyl   10 mg Rectal Daily   chlorhexidine   60 mL Topical Once   chlorhexidine   60 mL Topical Once   Chlorhexidine  Gluconate Cloth  6 each Topical Daily   [facility], Patient & Family Education   Does not apply Once   feeding supplement  237 mL Oral BID BM   lidocaine   1 patch Transdermal Q24H   pantoprazole   40 mg Oral Daily   polyethylene glycol  17 g Oral Daily   senna  1 tablet Oral Daily   sodium chloride  flush  10-40 mL Intracatheter Q12H   sodium chloride  flush  3 mL Intravenous Q12H   sodium chloride  flush  3 mL Intravenous Q12H   spironolactone   25 mg Oral Daily   torsemide   20 mg Oral Q M,W,F   Continuous Infusions:  sodium chloride      sodium chloride      promethazine  (PHENERGAN ) injection (IM or IVPB) Stopped (08/31/24 1154)   PRN Meds: albuterol , HYDROmorphone  (DILAUDID ) injection, hydrOXYzine , methocarbamol , ondansetron  (ZOFRAN ) IV, mouth rinse, oxyCODONE , polyethylene glycol, promethazine  (PHENERGAN ) injection (IM or IVPB), simethicone , sodium chloride  flush, sodium chloride  flush, sodium chloride  flush   Vital Signs  Vitals:   09/11/24 2341 09/12/24 0322 09/12/24 0500 09/12/24 0800  BP: 96/65 123/78  106/69  Pulse: 92   90  Resp: 20 20  20   Temp: 98.1 F (36.7 C) 98 F (36.7 C)  97.8 F (36.6 C)  TempSrc: Oral Oral  Oral  SpO2: 98% 97%  100%  Weight:   98.2 kg   Height:       No intake or output data in the 24 hours ending 09/12/24 0956     09/12/2024    5:00 AM 09/11/2024    3:39 AM 09/10/2024    4:01 AM  Last 3 Weights  Weight (lbs) 216 lb 7.9 oz 218 lb  0.6 oz 215 lb 9.8 oz  Weight (kg) 98.2 kg 98.9 kg 97.8 kg      Telemetry  SR/V paced- Personally Reviewed  ECG   SR, V paced, QRS  - Personally Reviewed  Physical Exam  Unchanged from yesterday GEN: No acute distress.   Neck: No JVD Cardiac: RRR, no murmurs, rubs, or gallops.  Respiratory: CTA b/l GI: Soft, nontender, non-distended  MS: unna boots b/l Neuro:  Nonfocal  Psych: Normal affect   PPM site: dermabond in place, no hematoma, bleeding, ecchymosis, stable  Labs High Sensitivity Troponin:  No results for input(s): TROPONINIHS in the last 720 hours.   Chemistry Recent Labs  Lab 09/10/24 0550 09/10/24 0953 09/11/24 0510 09/12/24 0916  NA  --  141 140 140  K  --  4.2 4.0 4.1  CL  --  106 109 106  CO2  --  26 28 25   GLUCOSE  --  111* 107* 126*  BUN  --  22 20 15   CREATININE  --  1.14* 1.14* 1.18*  CALCIUM   --  8.8* 8.4* 8.5*  MG 2.3  --  2.1 2.2  GFRNONAA  --  53* 53* 51*  ANIONGAP  --  9 3* 9    Lipids No results for input(s): CHOL, TRIG, HDL, LABVLDL, LDLCALC, CHOLHDL in the last 168 hours.  Hematology Recent Labs  Lab 09/09/24 0343 09/11/24 0510 09/12/24 0916  WBC 6.9 6.9 6.7  RBC 2.80* 2.74* 2.83*  HGB 8.7* 8.5* 9.0*  HCT 27.3* 26.9* 27.1*  MCV 97.5 98.2 95.8  MCH 31.1 31.0 31.8  MCHC 31.9 31.6 33.2  RDW 18.5* 19.1* 19.0*  PLT 282 279 279   Thyroid  No results for input(s): TSH, FREET4 in the last 168 hours.  BNPNo results for input(s): BNP, PROBNP in the last 168 hours.  DDimer No results for input(s): DDIMER in the last 168 hours.   Radiology  No results found.   Cardiac Studies  09/07/24: TTE 1. Left ventricular ejection fraction, by estimation, is 60 to 65%. The  left ventricle has normal function. The left ventricle has no regional  wall motion abnormalities. Left ventricular diastolic function could not  be evaluated.   2. Right ventricular systolic function is normal. The right ventricular  size  is normal. There is normal pulmonary artery systolic pressure. The  estimated right ventricular systolic pressure is 31.3 mmHg.   3. The mitral valve has been repaired/replaced. Trivial mitral valve  regurgitation. No evidence of mitral stenosis. The mean mitral valve  gradient is 3.0 mmHg. There is a Mitris Resila present in the mitral  position.   4. The aortic valve has been repaired/replaced. Aortic valve  regurgitation is not visualized. No aortic stenosis is present. There is a  21 mm Insiris Resila valve present in the aortic position. Aortic valve  mean gradient measures 14.0 mmHg. Aortic  valve Vmax measures 2.45 m/s.   5. The inferior vena cava is dilated in size with <50% respiratory  variability, suggesting right atrial pressure of 15 mmHg.   6. Compared to study dated 08/29/2024, the mean AVG has decreased from  to , Vmax has decreased from 3.65m/s to 2.60m/s and DI has  increased from 0.5 to 0.8. There is a normal functioning bioprosthetic MVR  present with no significant change  from prior study. PA paressure has normalized. RAP remains elevated at  .     CARDIAC CATHETERIZATION 07/16/2024   Conclusion Table formatting from the original result was not included. Images from the original result were not included.     Prox LAD to Mid LAD lesion is 50% stenosed.   Hemodynamic findings consistent with mild pulmonary hypertension.  PAP-mean 48/16-29 mmHg with PCWP of 22 to 24 mmHg   There is no aortic valve stenosis.-Unable to fully assess for AI.   There is severe MR by Echo/TEE   In the absence of any other complications or medical issues, we expect the patient to be ready for discharge from a cath perspective on 07/16/2024.   She will proceed with planned T CTS consultation for mitral and aortic valve disease.   Recommend Aspirin  81mg  daily for moderate CAD.   Dominance: Left Angiographically moderate single-vessel disease with segmental 50% calcified  stenosis in the mid LAD from SP1-D2 Mild Pulmonary Hypertension with mean PAP 29 mL of mercury and PCWP of 22 to 24 mmHg -> WHO Class II (due to valvular disease) Due to Valvular Disease, Cardiac Output and Index are mild to moderately reduced.   RECOMMENDATONS   In the absence of any other complications or medical issues, we expect the patient to be ready  for discharge from a cath perspective on 07/16/2024.   She will proceed with planned TCTS consultation for mitral and aortic valve disease.   Recommend Aspirin  81mg  daily for moderate CAD.     05/31/24: TTE IMPRESSIONS 1. Moderate to severe mitral regurgitation is now present. No stenosis. Would consider TEE for quantification of MR. The mitral valve is rheumatic. Moderate to severe mitral valve regurgitation. The mean mitral valve gradient is 2.5 mmHg with average heart rate of 63 bpm. 2. Left ventricular ejection fraction, by estimation, is 55 to 60%. The left ventricle has normal function. The left ventricle has no regional wall motion abnormalities. Left ventricular diastolic function could not be evaluated. 3. Right ventricular systolic function is normal. The right ventricular size is mildly enlarged. There is normal pulmonary artery systolic pressure. The estimated right ventricular systolic pressure is 31.3 mmHg. 4. Left atrial size was severely dilated. 5. The aortic valve is tricuspid. Aortic valve regurgitation is moderate. No aortic stenosis is present. 6. The inferior vena cava is normal in size with greater than 50% respiratory variability, suggesting right atrial pressure of 3 mmHg.     Patient Profile   68 y.o. female w/PMHx of  Breast Ca (R lumpectomy, chemo/XRT 2016 >>> 2024 b/l breast CA > left lumpectomy and R mastectomy, chemo and photon tx b/l) Smoker, HTN Rheumatic heart disease   Admitted for: planned MVR/AVR Median Sternotomy Extracorporeal circulation Femoral venous cannulation 4.   Aortic valve replacement  using a 21 mm Inspiris valve 5.   Mitral valve replacement using a 27 mm Mitris valve 6.   Left atrial appendage ligation  Post op course c/b cardiogenic shock AFlutter, SB/junctional , alternating BBB Now off amiodarone  EP engaged to consider PPM     Assessment & Plan   Rheumatic heart disease S/p AVR/MVR (both bioprosthetic) 08/26/24 Complicated post-op course   3.   Conduction system disease post op She has been seen w/variable IVCD, RBBB, LBBB and CHB Escape appears junctional w/narrow QRS, rates 70's 4.    Post-op AFlutter DCCV intra procedure 09/11/24 Eliquis  resumed 12/3 post implant Amiodarone  PO restarted as well   S/p PPM implant 09/11/24 w/Dr. Almetta Site is stable device check this morning with stable measurements CXR reviewed by Dr. Almetta with stable lead position, pending official radiologist read  Implant site care and activity restrictions reviewed with the patient  EP follow up is in place  OK to discharge from EP perspective once CXR formally read and negative for pneumothorax   For questions or updates, please contact [facility] Please consult [email or web address] for contact info under    Signed, Charlies Macario Arthur, PA-C  09/12/2024, 9:56 AM

## 2024-09-12 NOTE — Progress Notes (Signed)
   [address], [place] [address]             [phone]  1 Day Post-Op Procedure(s) (LRB): PACEMAKER IMPLANT (N/A) Subjective:  Sitting up in bed, has a little soreness from the PPM site.  Otherwise feels good and would like to return home.    Objective: Vital signs in last 24 hours: Temp:  [98 F (36.7 C)-98.1 F (36.7 C)] 98 F (36.7 C) (12/04 0322) Pulse Rate:  [59-95] 92 (12/03 2341) Cardiac Rhythm: Ventricular paced (12/03 1901) Resp:  [15-41] 20 (12/04 0322) BP: (96-139)/(42-78) 123/78 (12/04 0322) SpO2:  [97 %-100 %] 97 % (12/04 0322) Weight:  [98.2 kg] 98.2 kg (12/04 0500)    Intake/Output from previous day: No intake/output data recorded. Intake/Output this shift: No intake/output data recorded.  General appearance: alert, cooperative, and no distress Neurologic: intact  Heart: paced rhythm Lungs: normal work of breathing on RA, breath sounds clear.  Extremities:  Unna boots to both LE's ( changed 12/1) Wound: the sternotomy incision is healing well, there is a dry bulky dressing over the PPM pocket.  Lab Results: Recent Labs    09/11/24 0510  WBC 6.9  HGB 8.5*  HCT 26.9*  PLT 279   BMET:  Recent Labs    09/10/24 0953 09/11/24 0510  NA 141 140  K 4.2 4.0  CL 106 109  CO2 26 28  GLUCOSE 111* 107*  BUN 22 20  CREATININE 1.14* 1.14*  CALCIUM  8.8* 8.4*    PT/INR: No results for input(s): LABPROT, INR in the last 72 hours. ABG    Component Value Date/Time   PHART 7.415 08/29/2024 0105   HCO3 22.7 08/29/2024 0105   TCO2 24 08/29/2024 0105   ACIDBASEDEF 2.0 08/29/2024 0105   O2SAT 73.4 09/11/2024 0510   CBG (last 3)  No results for input(s): GLUCAP in the last 72 hours.  Assessment/Plan: S/P Procedure(s) (LRB): PACEMAKER IMPLANT (N/A)  -POD17 bioprosthetic AVR and MVR for severe rheumatic AI and MR with left atrial appendage ligation. Post-op cardiogenic shock requiring inotropic support for several days  in the ICU. Remains hemodynamically stable.  On ASA, spiro.  -Post-op atrial fibrillation / flutter / CHB.  Post-op day 1 dual chamber PPM with successful DCCV.  Amiodarone  and Eliquis  have been resumed.   -PULM- good oxygenation on RA.  Stable small right effusion on CXR. Will follow up in outpatient setting.   -GI-post-op ileus resolved. Appetite improved.  -RENAL- AKI post-op, creat has been near baseline. Labs pending this AM. Plan to continue Torsemide  every other day for now.   -HEME- expected ABL anemia.  Hct has been stable, monitoring.   -Disposition- She is independent with mobility and transfers. Plan discharge today on ASA, spiro, Eliquis , amiodarone , and torsemide  every other day. Arranged for [facility] PT along with rolling walker and BSC.  Follow up with Dr. Daniel on 12/18. F/U with HF and EP per their instructions.    LOS: 17 days    Hoover Grewe G. Akirah Storck, PA-C 09/12/2024

## 2024-09-12 NOTE — Op Note (Signed)
 Procedure:  LEFT sided MDT Dual Chamber PPM Implant DCCV  Pre-op Diagnosis: CHB, SSS   Post-op Diagnosis: same  Procedure Date: 09/11/24  Attending: Adina Primus, MD   Anesthesia: MAC  Procedure Report:  The LEFT shoulder was prepped with chlorhexidine  scrub. 40 cc lidocaine  was injected at the planned incision site. A pocket was created with electrocautery and blunt dissection.    A venogram was performed which demonstrated a patent axillary vein. The LEFT axillary vein was accessed using standard access needle under ultrasound guidance. A wire was passed into the IVC. The access process was repeated once and a second wire was advanced into the IVC.  A 7 Fr sheath was advanced and the dilator removed. Through this short sheath, a C315 fixed curve sheath was advanced to the basal RV septum.   The RV lead was inserted into the sheath and the lead was then advanced to the basal RV septum.   The site for deployment was identified based on RAO and LAO fluoroscopy and response to pacing. With RV endocardial pacing, we observed a W pattern in V1 and aVR/aVL discordance. The body of the 3830 lead was rotated in a clockwise fashion and it was advanced into the RV septum.   QRS morphology changes were observed during threshold testing (selective LBaP at 1.5 V @ 0.4 ms).  The paced morphology demonstrated qR in V1 with selective capture.  An isoelectric segment was present (25 ms).  The LVAT was 70 ms as measured  from onset of stim to peak R wave in V6. The paced QRS duration was 132 ms.  The above findings were most consistent with selective LB capture  The sheath was split and removed and the RV lead was firmly attached to the pre-pectoralis fascia with two 0-silk sutures.   The RA lead was withdrawn from the RV apical septum to the RA. A curved stylet was then used to affix the RA lead in the right atrial appendage. The active fixation screw was deployed, and interrogation revealed a  current of injury with acceptable lead parameters (see below). The peel away sheath was removed and the lead was affixed to the fascia using two 0-silk sutures.   DCCV (100J x1) from AFL->NSR with CHB and junctional escape. Lead parameters were stable post cardioversion.   The device was then connected to the leads. The pocket was irrigated with gentamicin solution. A TYRX pouch was used. The pocket was closed with continuous 2-0 V-Loc and 3-0 Stratafix. Dermabond was used to close the superficial layer. A vaso-occlusive dressing was placed.    The device was evaluated by the representative of the cardiac device manufacturer under the supervision of the attending physician with implant parameters listed below.   Complications: none Estimated blood loss: less than 50 mL  Implanted Hardware: Implant Name Type Inv. Item Serial No. Manufacturer Lot No. LRB No. Used Action  LEAD CAPSURE NOVUS 5076-52CM - [id number] Lead LEAD CAPSURE NOVUS 5076-52CM [id number] [employer]  N/A 1 Implanted  LEAD SELECT SECURE 3830 [id number] - [id number] Lead LEAD SELECT SECURE 3830 [id number] [employer]  N/A 1 Implanted  POUCH AIGIS-R ANTIBACT PPM MED - [id number] Mesh General POUCH AIGIS-R ANTIBACT PPM MED  [employer] [id number] N/A 1 Implanted  IPG PACE AZUR XT DR MRI T8IM98 - [id number] Pacemaker IPG PACE AZUR XT DR MRI T8IM98 [id number] [employer]  N/A 1 Implanted   Device Information: PPM: MDT Azure XT DR MRI [id number], SN  [id number], DOI 09/11/24 RA:  MDT 5076 CapsureFix Novus MRI, SN [id number], DOI 09/11/24 RV/LBaP: MDT 3830 SelectSecure, SN [id number], DOI 09/11/24  Lead Interrogation Data: RA: 1.3 mV / 513 ohms / 0.5 V @ 0.4 ms RV/LBaP: 17.4 mV / 798 ohms / 0.5 V @ 0.4 ms  Summary: 1. Successful implant of a LEFT sided MDT DC/LBBAP PPM with selective LB capture 2. Successful DCCV (100J x1) from AFL->NSR with CHB and  junctional escape   Recommendations: Routine post-procedure care with bedrest for 3 hours No heparin  (IV or subcutaneous) for 48 hours. No enoxaparin  (IV or subcutaneous) for 7 days.  Resume apixaban  5 mg bid tonight and continue ASA 81 mg daily  PA/lateral CXR in AM Wound check in AM Device interrogation in AM  [name], MD [facility]  Cardiac Electrophysiology

## 2024-09-12 NOTE — Progress Notes (Signed)
 Patient ID: Katie Jacobson, female   DOB: August 17, 1956, 68 y.o.   MRN: [id number]     Advanced Heart Failure Rounding Note  Cardiologist: Alm Clay, MD  Chief Complaint: CHF Subjective:    Underwent PPM and DC-CV yesterday.   Currently paced  Feels good.   Ready for d/c  Objective:   Weight Range: 98.2 kg Body mass index is 40.91 kg/m.   Vital Signs:   Temp:  [97.8 F (36.6 C)-98.1 F (36.7 C)] 97.8 F (36.6 C) (12/04 0800) Pulse Rate:  [59-95] 90 (12/04 0800) Resp:  [15-41] 20 (12/04 0800) BP: (96-139)/(42-78) 106/69 (12/04 0800) SpO2:  [97 %-100 %] 100 % (12/04 0800) Weight:  [98.2 kg] 98.2 kg (12/04 0500) Last BM Date : 09/10/24  Weight change: Filed Weights   09/10/24 0401 09/11/24 0339 09/12/24 0500  Weight: 97.8 kg 98.9 kg 98.2 kg    Intake/Output:  No intake or output data in the 24 hours ending 09/12/24 0832     Physical Exam   General:  Sitting up in bed. No resp difficulty HEENT: normal Neck: supple. no JVD.  Cor: Regular rate & rhythm. PPM site ok  Lungs: clear Abdomen: soft, nontender, nondistended.Good bowel sounds. Extremities: no cyanosis, clubbing, rash, edema Neuro: alert & orientedx3, cranial nerves grossly intact. moves all 4 extremities w/o difficulty. Affect pleasant   Telemetry   AV paced 60 Personally reviewed    Labs    CBC Recent Labs    09/11/24 0510  WBC 6.9  HGB 8.5*  HCT 26.9*  MCV 98.2  PLT 279   Basic Metabolic Panel Recent Labs    87/97/74 0550 09/10/24 0953 09/11/24 0510  NA  --  141 140  K  --  4.2 4.0  CL  --  106 109  CO2  --  26 28  GLUCOSE  --  111* 107*  BUN  --  22 20  CREATININE  --  1.14* 1.14*  CALCIUM   --  8.8* 8.4*  MG 2.3  --  2.1   Liver Function Tests No results for input(s): AST, ALT, ALKPHOS, BILITOT, PROT, ALBUMIN  in the last 72 hours. No results for input(s): LIPASE, AMYLASE in the last 72 hours. Cardiac Enzymes No results for input(s): CKTOTAL,  CKMB, CKMBINDEX, TROPONINI in the last 72 hours.  BNP: BNP (last 3 results) No results for input(s): BNP in the last 8760 hours.  ProBNP (last 3 results) No results for input(s): PROBNP in the last 8760 hours.   D-Dimer No results for input(s): DDIMER in the last 72 hours. Hemoglobin A1C No results for input(s): HGBA1C in the last 72 hours. Fasting Lipid Panel No results for input(s): CHOL, HDL, LDLCALC, TRIG, CHOLHDL, LDLDIRECT in the last 72 hours. Thyroid  Function Tests No results for input(s): TSH, T4TOTAL, T3FREE, THYROIDAB in the last 72 hours.  Invalid input(s): FREET3  Other results:   Imaging    No results found.      Medications:     Scheduled Medications:  acetaminophen   975 mg Oral TID   amiodarone   200 mg Oral Daily   apixaban   5 mg Oral BID   aspirin  EC  81 mg Oral Daily   bisacodyl   10 mg Oral Daily   Or   bisacodyl   10 mg Rectal Daily   chlorhexidine   60 mL Topical Once   chlorhexidine   60 mL Topical Once   Chlorhexidine  Gluconate Cloth  6 each Topical Daily   [facility], Patient &  Family Education   Does not apply Once   feeding supplement  237 mL Oral BID BM   lidocaine   1 patch Transdermal Q24H   pantoprazole   40 mg Oral Daily   polyethylene glycol  17 g Oral Daily   senna  1 tablet Oral Daily   sodium chloride  flush  10-40 mL Intracatheter Q12H   sodium chloride  flush  3 mL Intravenous Q12H   sodium chloride  flush  3 mL Intravenous Q12H   spironolactone   25 mg Oral Daily   torsemide   20 mg Oral Q M,W,F    Infusions:  sodium chloride      sodium chloride      promethazine  (PHENERGAN ) injection (IM or IVPB) Stopped (08/31/24 1154)    PRN Medications: albuterol , HYDROmorphone  (DILAUDID ) injection, hydrOXYzine , methocarbamol , ondansetron  (ZOFRAN ) IV, mouth rinse, oxyCODONE , polyethylene glycol, promethazine  (PHENERGAN ) injection (IM or IVPB), simethicone , sodium chloride  flush, sodium  chloride flush, sodium chloride  flush    Assessment/Plan   1. Rheumatic heart disease: Severe MR and severe AI on 9/25 TEE with EF 55-60%. S/p AVR with 21 mm Inspiris valve, MVR with 27 mm Mitris valve and LAA ligation by Dr. Adrien Shankar on 08/26/24. Post-op echo (11/20) showed LV EF 65%, mild RV dilation with normal systolic function but there is likely severe tricuspid regurgitation, s/p bioprosthetic MV replacement with mean gradient 4, s/p bioprosthetic AV replacement with mean gradient 20.   - Echo 11/29 EF 60% RV ok. Mean AVG 14. Mild TR 2. Post-op cardiogenic shock/RV failure:  Echo post-op on 11/20 showed LV EF 65%, mild RV dilation with normal systolic function but there is likely severe tricuspid regurgitation, s/p bioprosthetic MV replacement with mean gradient 4, s/p bioprosthetic AV replacement with mean gradient 20.  -Looks great continue current meds inclduing torsemide  20 MWF 3. Atrial flutter: Atypical, noted post-op. - s/p DC-CV 11/28. -> sinus with CHB - Amio stopped 11/30 with CHB - s/p PPM and DC-CV on 12/3 - AV paced now - continue amio 200 daily and Eliquis  4. CHB - s/p PPM and DC-CV on 12/3 - AV paced now - continue amio 200 daily and Eliquis  4. AKI: - Scr up improved Scr 1.5>>11.1 today  5. Anemia: Post-op. Hgb 8.5 today. Transfuse hgb < 8.  6. Hypokalemia - resolved, K 4.0 - continue spiro  7. Post-op ileus - Resolved  Home today. Will arrange HF f/u    Length of Stay: 17  Toribio Fuel, MD  09/12/2024, 8:32 AM  Advanced Heart Failure Team Pager [phone] (M-F; 7a - 5p)  Please contact [facility] for night-coverage after hours (5p -7a ) and weekends on [email or web address]

## 2024-09-12 NOTE — Progress Notes (Signed)
 Orthopedic Tech Progress Note Patient Details:  Katie Jacobson 10-22-1955 [id number]  Ortho Devices Type of Ortho Device: Radio broadcast assistant Ortho Device/Splint Location: n/a Ortho Device/Splint Interventions: Ordered   Post Interventions Patient Tolerated: Refused intervention (pt is going home and wanting to shower so she does not want the UB's reapplied) Instructions Provided: Care of device  Katie Jacobson Ronal Brasil 09/12/2024, 11:36 AM

## 2024-09-13 ENCOUNTER — Telehealth: Payer: Self-pay

## 2024-09-13 NOTE — Transitions of Care (Post Inpatient/ED Visit) (Signed)
 09/13/2024  Name: Katie Jacobson MRN: [id number] DOB: [birth year]  Today's TOC FU Call Status: Today's TOC FU Call Status:: Successful TOC FU Call Completed TOC FU Call Complete Date: 09/13/24  Patient's Name and Date of Birth confirmed. Name, DOB  Transition Care Management Follow-up Telephone Call Date of Discharge: 09/12/24 Discharge Facility: [facility]) Type of Discharge: Inpatient Admission Primary Inpatient Discharge Diagnosis:: Aortic Valve Replacement How have you been since you were released from the hospital?: Better Any questions or concerns?: No  Items Reviewed: Did you receive and understand the discharge instructions provided?: Yes Medications obtained,verified, and reconciled?: Yes (Medications Reviewed) Any new allergies since your discharge?: No Dietary orders reviewed?: Yes Type of Diet Ordered:: Low sodium Heart Healthy Do you have support at home?: Yes People in Home [RPT]: spouse Name of Support/Comfort Primary Source: Tanda Blush  Medications Reviewed Today: Medications Reviewed Today     Reviewed by Moises Reusing, RN (Case Manager) on 09/13/24 at 1336  Med List Status: <None>   Medication Order Taking? Sig Documenting Provider Last Dose Status Informant  acetaminophen  (TYLENOL ) 325 MG tablet [id number]  Take 2 tablets (650 mg total) by mouth every 6 (six) hours as needed. Roddenberry, Myron G, PA-C  Active   albuterol  (VENTOLIN  HFA) 108 (90 Base) MCG/ACT inhaler [id number]  Inhale 1-2 puffs into the lungs every 6 (six) hours as needed for wheezing or shortness of breath. Daryl Setter, NP  Active Self  amiodarone  (PACERONE ) 200 MG tablet [id number]  Take 1 tablet (200 mg total) by mouth daily. Roddenberry, Myron G, PA-C  Active   apixaban  (ELIQUIS ) 5 MG TABS tablet [id number]  Take 1 tablet (5 mg total) by mouth 2 (two) times daily. Roddenberry, Myron G, PA-C  Active   aspirin  81 MG tablet [id number]  Take 1 tablet (81 mg total) by mouth  daily. Magrinat, Sandria BROCKS, MD  Active Self  fluticasone  (FLONASE ) 50 MCG/ACT nasal spray [id number]  Place 1 spray into both nostrils as needed for allergies or rhinitis. [provider]  Active Self  methocarbamol  (ROBAXIN ) 500 MG tablet [id number]  Take 1 tablet (500 mg total) by mouth every 8 (eight) hours as needed for muscle spasms. Roddenberry, Myron G, PA-C  Active   oxyCODONE  (OXY IR/ROXICODONE ) 5 MG immediate release tablet [id number]  Take 1 tablet (5 mg total) by mouth every 4 (four) hours as needed for up to 7 days for moderate pain (pain score 4-6). For pain not controlled by Tylenol . Roddenberry, Myron G, PA-C  Active   spironolactone  (ALDACTONE ) 25 MG tablet [id number]  Take 1 tablet (25 mg total) by mouth daily. Roddenberry, Myron G, PA-C  Active   torsemide  (DEMADEX ) 20 MG tablet [id number]  Take 1 tablet (20 mg total) by mouth every Monday, Wednesday, and Friday. Roddenberry, Myron G, PA-C  Active             Home Care and Equipment/Supplies: Were Home Health Services Ordered?: Yes Name of Home Health Agency:: [facility] Has Agency set up a time to come to your home?: Yes First Home Health Visit Date: 09/13/24 Any new equipment or medical supplies ordered?: Yes Name of Medical supply agency?: [facility] Were you able to get the equipment/medical supplies?: Yes Do you have any questions related to the use of the equipment/supplies?: No  Functional Questionnaire: Do you need assistance with bathing/showering or dressing?: No Do you need assistance with meal preparation?: No Do you need assistance with eating?: No Do you have difficulty maintaining continence: No  Do you need assistance with getting out of bed/getting out of a chair/moving?: No Do you have difficulty managing or taking your medications?: No  Follow up appointments reviewed: PCP Follow-up appointment confirmed?: No (The patient is uncomfortable with constipation and decided she will call and make an  appointment because she wanted to get off the phone) MD Provider Line Number:[phone] Given: No Specialist Hospital Follow-up appointment confirmed?: Yes Date of Specialist follow-up appointment?: 09/19/24 Follow-Up Specialty Provider:: Heart Valve clinic Do you need transportation to your follow-up appointment?: No Do you understand care options if your condition(s) worsen?: Yes-patient verbalized understanding  SDOH Interventions Today    Flowsheet Row Most Recent Value  SDOH Interventions   Food Insecurity Interventions Intervention Not Indicated  Housing Interventions Intervention Not Indicated  Transportation Interventions Intervention Not Indicated  Utilities Interventions Intervention Not Indicated    Medford Balboa, BSN, RN [place]  [facility]BC[facility] - Population Health RN Care Manager [phone]

## 2024-09-15 ENCOUNTER — Encounter (HOSPITAL_COMMUNITY): Payer: Self-pay | Admitting: Student in an Organized Health Care Education/Training Program

## 2024-09-18 ENCOUNTER — Other Ambulatory Visit: Payer: Self-pay

## 2024-09-18 DIAGNOSIS — I351 Nonrheumatic aortic (valve) insufficiency: Secondary | ICD-10-CM

## 2024-09-18 DIAGNOSIS — I080 Rheumatic disorders of both mitral and aortic valves: Secondary | ICD-10-CM

## 2024-09-18 DIAGNOSIS — I050 Rheumatic mitral stenosis: Secondary | ICD-10-CM

## 2024-09-19 ENCOUNTER — Ambulatory Visit (INDEPENDENT_AMBULATORY_CARE_PROVIDER_SITE_OTHER)

## 2024-09-19 ENCOUNTER — Ambulatory Visit (HOSPITAL_COMMUNITY)
Admission: RE | Admit: 2024-09-19 | Discharge: 2024-09-19 | Disposition: A | Discharge status: Home / Self Care | Source: Ambulatory Visit

## 2024-09-19 ENCOUNTER — Ambulatory Visit (HOSPITAL_COMMUNITY)

## 2024-09-19 ENCOUNTER — Telehealth (HOSPITAL_COMMUNITY): Payer: Self-pay

## 2024-09-19 VITALS — BP 137/80 | HR 100 | Resp 20 | Ht 61.0 in | Wt 223.1 lb

## 2024-09-19 DIAGNOSIS — Z950 Presence of cardiac pacemaker: Secondary | ICD-10-CM | POA: Diagnosis not present

## 2024-09-19 DIAGNOSIS — I050 Rheumatic mitral stenosis: Secondary | ICD-10-CM | POA: Insufficient documentation

## 2024-09-19 DIAGNOSIS — I080 Rheumatic disorders of both mitral and aortic valves: Secondary | ICD-10-CM | POA: Insufficient documentation

## 2024-09-19 DIAGNOSIS — Z952 Presence of prosthetic heart valve: Secondary | ICD-10-CM | POA: Insufficient documentation

## 2024-09-19 DIAGNOSIS — I351 Nonrheumatic aortic (valve) insufficiency: Secondary | ICD-10-CM | POA: Insufficient documentation

## 2024-09-19 DIAGNOSIS — R9389 Abnormal findings on diagnostic imaging of other specified body structures: Secondary | ICD-10-CM | POA: Diagnosis not present

## 2024-09-19 DIAGNOSIS — R918 Other nonspecific abnormal finding of lung field: Secondary | ICD-10-CM | POA: Diagnosis not present

## 2024-09-19 DIAGNOSIS — Z9889 Other specified postprocedural states: Secondary | ICD-10-CM | POA: Insufficient documentation

## 2024-09-19 DIAGNOSIS — I340 Nonrheumatic mitral (valve) insufficiency: Secondary | ICD-10-CM | POA: Diagnosis not present

## 2024-09-19 NOTE — Telephone Encounter (Signed)
 Called to confirm/remind patient of their appointment at the [facility] on 09/19/24 10:30.   Appointment:   [] Confirmed  [x] Left mess   [] No answer/No voice mail  [] VM Full/unable to leave message  [] Phone not in service  Patient reminded to bring all medications and/or complete list.  Confirmed patient has transportation. Gave directions, instructed to utilize valet parking.

## 2024-09-19 NOTE — Progress Notes (Signed)
 [address] [place] [address]             [phone]       HPI:  Patient returns for routine postoperative follow-up having undergone bio AVR (21 mm Inspiris)/MVR (27 mm mitris), LAAL on 08/26/24.  She was discharged on POD 17, she is now about 3 weeks post-op.  The patient's early postoperative recovery while in the hospital was notable for ileus, heart failure requiring inotropes and aggressive diuresis, Aflutter s/p cardioversion and CHB s/p PPM placement on 09/11/24.  Weight today is 223 lbs, was 216 lbs at discharge  Her legs are a little swollen and she is still taking Torsemide  MWF.  She has a good response when she takes it and her legs do improve the following day.  She also has some lateral right chest pain that seems pleuritic. Right effusion is improving and pain is reproducible with palpation.  Has improved with Tylenol .  She previously had stopped all pain meds due to constipation.  That has resolved but she tried tylenol  yesterday which worked.  She is not smoking; she was congratulated.  Allergies as of 09/19/2024       Reactions   Sulfa Antibiotics Other (See Comments)   UNKNOWN        Medication List        Accurate as of September 19, 2024 11:26 AM. If you have any questions, ask your nurse or doctor.          STOP taking these medications    methocarbamol  500 MG tablet Commonly known as: ROBAXIN  Stopped by: Katie Clunes, MD   oxyCODONE  5 MG immediate release tablet Commonly known as: Oxy IR/ROXICODONE  Stopped by: Katie Clunes, MD       TAKE these medications    acetaminophen  325 MG tablet Commonly known as: TYLENOL  Take 2 tablets (650 mg total) by mouth every 6 (six) hours as needed.   albuterol  108 (90 Base) MCG/ACT inhaler Commonly known as: VENTOLIN  HFA Inhale 1-2 puffs into the lungs every 6 (six) hours as needed for wheezing or shortness of breath.   amiodarone  200 MG tablet Commonly known as:  PACERONE  Take 1 tablet (200 mg total) by mouth daily.   aspirin  81 MG tablet Take 1 tablet (81 mg total) by mouth daily.   Eliquis  5 MG Tabs tablet Generic drug: apixaban  Take 1 tablet (5 mg total) by mouth 2 (two) times daily.   fluticasone  50 MCG/ACT nasal spray Commonly known as: FLONASE  Place 1 spray into both nostrils as needed for allergies or rhinitis.   spironolactone  25 MG tablet Commonly known as: ALDACTONE  Take 1 tablet (25 mg total) by mouth daily.   torsemide  20 MG tablet Commonly known as: DEMADEX  Take 1 tablet (20 mg total) by mouth every Monday, Wednesday, and Friday.        BP 137/80 (BP Location: Left Arm, Patient Position: Sitting, Cuff Size: Normal) Comment (Cuff Size): forearm per pt request  Pulse 100   Resp 20   Ht 5' 1 (1.549 m)   Wt 223 lb 1.6 oz (101.2 kg)   LMP 10/10/1996   SpO2 97% Comment: RA  BMI 42.15 kg/m   Physical Exam: General - Resting comfortably in chair CV - RRR; sternotomy incision healing well. No drainage Resp - Unlabored on RA; tenderness on right lateral chest wall but no erythema, skin changes. Abd - Soft, ND/ND Ext - Moderate edema bilaterally  Imaging: CXR - Clear lung fields, improved right pleural effusion   Assessment/Plan: Katie Jacobson is a 68 year old woman s/p Bio AVR/MVR and LAAL on 11/17.  Post-op course complicated by Aflutter, converted to CHB and now with PPM.  She is recovering well at home.  Has some increased leg edema - encouraged her to take Torsemide  an additional day and to decrease free water  intake and to weigh herself daily.  She will take Tylenol  daily for pain which is helping the right chest wall pain.  Plan to return to clinic on 12/22 with BMP.   Katie GORMAN Clunes, MD 11:26 AM 09/19/2024

## 2024-09-19 NOTE — Progress Notes (Incomplete)
 Advanced Heart Failure Rounding Note  HF Cardiologist: Dr. Rolan   HPI: Katie Jacobson is a 68 y.o. 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    She underwent planned AVR with 21 mm Inspiris valve, MVR with 27 mm Mitris valve and LAA ligation by Dr. Daniel on 08/26/24. TEE post-bypass with normal functioning prosthetic valves and moderately reduced LV. Became hypotensive after chest was closed requiring escalation in pressors/inotropes. She had trouble with hypoxia, volume overload and difficulty weaning from inotrope support post-op. She was diuresed and later weaned from inotropes. Coarse further c/b post-op atrial flutter and ileus. Started on amiodarone  gtt and underwent DCCV to SR, but developed CHB. Seen by EP and underwent PPM implant and DCCV on 12/03.  She is here today for post hospital CHF follow-up.  ROS: All systems negative except as listed in HPI, PMH and Problem List.  SH:  Social History   Socioeconomic History   Marital status: Married    Spouse name: Not on file   Number of children: 1   Years of education: Not on file   Highest education level: Not on file  Occupational History   Not on file  Tobacco Use   Smoking status: Some Days    Current packs/day: 0.50    Average packs/day: 0.5 packs/day for 43.0 years (21.5 ttl pk-yrs)    Types: Cigarettes   Smokeless tobacco: Never   Tobacco comments:    08/22/24 - one pack of cigarettes every couple of days  Vaping Use   Vaping status: Never Used  Substance and Sexual Activity   Alcohol use: Not Currently    Comment: occ    Drug use: No   Sexual activity: Yes    Partners: Male  Other Topics Concern   Not on file  Social History Narrative   Married with 2 daughters- born 48 and 8 ([name] died in MVA 09-26-16)   Previously worked -- optician, 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  Has been doing part time work with [employer] -- had 6 active clients, but the most important one is her Daughter.   Regular exercise:  No   Caffeine Use: none; Current 1ppd smoker x > 39 yrs; EtOH ~4 oz/week   Social Drivers of Health   Tobacco Use: High Risk (09/11/2024)   Patient History    Smoking Tobacco Use: Some Days    Smokeless Tobacco Use: Never    Passive Exposure: Not on file  Financial Resource Strain: Not on file  Food Insecurity: No Food Insecurity (09/13/2024)   Epic    Worried About Programme Researcher, Broadcasting/film/video in the Last Year: Never true    Ran Out of Food in the Last Year: Never true  Transportation Needs: No Transportation Needs (09/13/2024)   Epic    Lack of Transportation (Medical): No    Lack of Transportation (Non-Medical): No  Physical Activity: Not on file  Stress: Not on file  Social Connections: Unknown (08/28/2024)   Social Connection and Isolation Panel    Frequency of Communication with Friends and Family: Never    Frequency of Social Gatherings with Friends and Family: Never    Attends Religious Services: Never    Database Administrator or Organizations: Yes    Attends Banker Meetings:  1 to 4 times per year    Marital Status: Patient declined  Intimate Partner Violence: Not At Risk (09/13/2024)   Epic    Fear of Current or Ex-Partner: No    Emotionally Abused: No    Physically Abused: No    Sexually Abused: No  Depression (PHQ2-9): Low Risk (12/27/2023)   Depression (PHQ2-9)    PHQ-2 Score: 2  Alcohol Screen: Not on file  Housing: Unknown (09/13/2024)   Epic    Unable to Pay for Housing in the Last Year: No    Number of Times Moved in the Last Year: Not on file    Homeless in the Last Year: No  Utilities: Not At Risk (09/13/2024)   Epic    Threatened with loss of utilities: No  Health Literacy: Not on file    FH:  Family History  Problem Relation Age of Onset   Heart disease  Mother    Stroke Mother    Hypertension Mother    Diabetes Mother    Heart attack Father    Diabetes Sister    Diabetes Sister    Kidney disease Brother    Mental retardation Brother    Colon polyps Daughter    Cancer Cousin    Breast cancer Cousin        deceased 39   Colon cancer Neg Hx    Esophageal cancer Neg Hx    Rectal cancer Neg Hx    Stomach cancer Neg Hx     Past Medical History:  Diagnosis Date   Allergy    Arthritis    Breast cancer of upper-outer quadrant of right female breast (HCC) 11/14/2014   Treated with chemotherapy and radiation (diagnosed again in March 2024 in both breasts)   Heart murmur    History of chemotherapy    finished chemo 02/03/2015   History of kidney stones    History of seizures    as a child - unknown cause - states was never on anticonvulsants   Hypertension    states under control with meds., has been on med. x years   Mitral regurgitation    Mitral valve prolapse 1990   Rheumatic mitral valve with moderate to severe MR by echo 05/31/2024 => TEE suggests rheumatic mitral valve with severe calcified leaflets and severe mitral regurgitation but also severe aortic regurgitation   Osteoporosis    Personal history of chemotherapy    Personal history of radiation therapy    Port-A-Cath in place 01/24/2023   Seasonal allergies     Current Outpatient Medications  Medication Sig Dispense Refill   acetaminophen  (TYLENOL ) 325 MG tablet Take 2 tablets (650 mg total) by mouth every 6 (six) hours as needed.     albuterol  (VENTOLIN  HFA) 108 (90 Base) MCG/ACT inhaler Inhale 1-2 puffs into the lungs every 6 (six) hours as needed for wheezing or shortness of breath. 1 each 0   amiodarone  (PACERONE ) 200 MG tablet Take 1 tablet (200 mg total) by mouth daily. 30 tablet 2   apixaban  (ELIQUIS ) 5 MG TABS tablet Take 1 tablet (5 mg total) by mouth 2 (two) times daily. 60 tablet 5   aspirin  81 MG tablet Take 1 tablet (81 mg total) by mouth daily. 30  tablet 0   fluticasone  (FLONASE ) 50 MCG/ACT nasal spray Place 1 spray into both nostrils as needed for allergies or rhinitis.     methocarbamol  (ROBAXIN ) 500 MG tablet Take 1 tablet (500 mg total) by mouth every 8 (eight)  hours as needed for muscle spasms. 15 tablet 1   oxyCODONE  (OXY IR/ROXICODONE ) 5 MG immediate release tablet Take 1 tablet (5 mg total) by mouth every 4 (four) hours as needed for up to 7 days for moderate pain (pain score 4-6). For pain not controlled by Tylenol . (Patient not taking: Reported on 09/13/2024) 30 tablet 0   spironolactone  (ALDACTONE ) 25 MG tablet Take 1 tablet (25 mg total) by mouth daily. 30 tablet 2   torsemide  (DEMADEX ) 20 MG tablet Take 1 tablet (20 mg total) by mouth every Monday, Wednesday, and Friday. 30 tablet 1   No current facility-administered medications for this visit.    There were no vitals filed for this visit.  PHYSICAL EXAM:  General:  Well appearing. No resp difficulty HEENT: normal Neck: supple. JVP flat. Carotids 2+ bilaterally; no bruits. No lymphadenopathy or thryomegaly appreciated. Cor: PMI normal. Regular rate & rhythm. No rubs, gallops or murmurs. Lungs: clear Abdomen: soft, nontender, nondistended. No hepatosplenomegaly. No bruits or masses. Good bowel sounds. Extremities: no cyanosis, clubbing, rash, edema Neuro: alert & orientedx3, cranial nerves grossly intact. Moves all 4 extremities w/o difficulty. Affect pleasant.   ECG:    ASSESSMENT & PLAN:  1. Rheumatic heart disease: Severe MR and severe AI on 9/25 TEE with EF 55-60%. S/p AVR with 21 mm Inspiris valve, MVR with 27 mm Mitris valve and LAA ligation by Dr. Daniel on 08/26/24. Post-op echo (11/20) showed LV EF 65%, mild RV dilation with normal systolic function but there is likely severe tricuspid regurgitation, s/p bioprosthetic MV replacement with mean gradient 4, s/p bioprosthetic AV replacement with mean gradient 20.   - Echo 11/29 EF 60% RV ok. Mean AVG 14. Mild TR 2.  Post-op cardiogenic shock/RV failure:  Echo post-op on 11/20 showed LV EF 65%, mild RV dilation with normal systolic function but there is likely severe tricuspid regurgitation, s/p bioprosthetic MV replacement with mean gradient 4, s/p bioprosthetic AV replacement with mean gradient 20.  -Looks great continue current meds inclduing torsemide  20 MWF 3. Atrial flutter: Atypical, noted post-op. - s/p DC-CV 11/28. -> sinus with CHB - Amio stopped 11/30 with CHB - s/p PPM and DC-CV on 12/3 - AV paced now - continue amio 200 daily and Eliquis  4. CHB - s/p PPM and DC-CV on 12/3 - AV paced now - continue amio 200 daily and Eliquis    Follow up   Manuelita Dutch, PA-C

## 2024-09-20 NOTE — Telephone Encounter (Signed)
 error

## 2024-09-24 ENCOUNTER — Ambulatory Visit: Attending: Internal Medicine

## 2024-09-24 ENCOUNTER — Ambulatory Visit

## 2024-09-24 DIAGNOSIS — I442 Atrioventricular block, complete: Secondary | ICD-10-CM

## 2024-09-24 LAB — CUP PACEART INCLINIC DEVICE CHECK
Date Time Interrogation Session: 20251216143717
Implantable Lead Connection Status: 753985
Implantable Lead Connection Status: 753985
Implantable Lead Implant Date: 20251203
Implantable Lead Implant Date: 20251203
Implantable Lead Location: 753859
Implantable Lead Location: 753860
Implantable Lead Model: 3830
Implantable Lead Model: 5076
Implantable Pulse Generator Implant Date: 20251203

## 2024-09-24 NOTE — Patient Instructions (Signed)
°  After Your Pacemaker   Monitor your pacemaker site for redness, swelling, and drainage. Call the device clinic at [phone] if you experience these symptoms or fever/chills.  Your incision was closed with Dermabond:  You may shower 1 day after your defibrillator implant and wash your incision with soap and water . Avoid lotions, ointments, or perfumes over your incision until it is well-healed.  You may use a hot tub or a pool after your wound check appointment if the incision is completely closed.  Do not lift, push or pull greater than 10 pounds with the affected arm until January 14th, 2025. There are no other restrictions in arm movement after your wound check appointment.  You may drive, unless driving has been restricted by your healthcare providers.  Remote monitoring is used to monitor your pacemaker from home. This monitoring is scheduled every 91 days by our office. It allows us  to keep an eye on the functioning of your device to ensure it is working properly. You will routinely see your Electrophysiologist annually (more often if necessary).

## 2024-09-24 NOTE — Progress Notes (Signed)
 Normal dual chamber pacemaker wound check. Presenting rhythm: AS/VP 95 . Wound well healed. Routine testing performed. Thresholds, sensing, and impedance consistent with implant measurements and at 3.5V safety margin/auto capture until 3 month visit. No episodes. Reviewed arm restrictions to continue for 6 weeks total post op.  Pt enrolled in remote follow-up.

## 2024-09-26 ENCOUNTER — Ambulatory Visit

## 2024-09-26 ENCOUNTER — Other Ambulatory Visit: Payer: Self-pay

## 2024-09-26 DIAGNOSIS — Z952 Presence of prosthetic heart valve: Secondary | ICD-10-CM

## 2024-09-30 ENCOUNTER — Other Ambulatory Visit: Payer: Self-pay

## 2024-09-30 ENCOUNTER — Other Ambulatory Visit: Payer: Self-pay | Admitting: *Deleted

## 2024-09-30 ENCOUNTER — Other Ambulatory Visit (HOSPITAL_COMMUNITY)

## 2024-09-30 ENCOUNTER — Ambulatory Visit (HOSPITAL_COMMUNITY)
Admission: RE | Admit: 2024-09-30 | Discharge: 2024-09-30 | Disposition: A | Discharge status: Home / Self Care | Source: Ambulatory Visit | Attending: Cardiovascular Disease | Admitting: Cardiovascular Disease

## 2024-09-30 ENCOUNTER — Ambulatory Visit: Payer: Self-pay

## 2024-09-30 VITALS — BP 165/96 | HR 102 | Resp 18 | Ht 61.0 in | Wt 225.0 lb

## 2024-09-30 DIAGNOSIS — Z952 Presence of prosthetic heart valve: Secondary | ICD-10-CM | POA: Insufficient documentation

## 2024-09-30 DIAGNOSIS — Z9889 Other specified postprocedural states: Secondary | ICD-10-CM

## 2024-09-30 MED ORDER — LIDOCAINE 5 % EX PTCH: 1.0000 | MEDICATED_PATCH | CUTANEOUS | 0 refills | Status: AC

## 2024-09-30 NOTE — Progress Notes (Signed)
 "     [address] [place] [address]             [phone]       HPI:  Patient returns for routine postoperative follow-up having undergone bio AVR (21 mm Inspiris)/MVR (27 mm mitris), LAAL on 08/26/24.  She was discharged on POD 17, she is now 5 weeks post-op.  Wt today is 225 lbs from 223 at last post op visit.  She continues to take torsemide  every MWF.  Her main complaint continues to be right chest wall pain.  It is tender to touch and prevents her from getting sleep.  She has only been taking tylenol .    Allergies as of 09/30/2024       Reactions   Sulfa Antibiotics Other (See Comments)   UNKNOWN        Medication List        Accurate as of September 30, 2024  2:38 PM. If you have any questions, ask your nurse or doctor.          acetaminophen  325 MG tablet Commonly known as: TYLENOL  Take 2 tablets (650 mg total) by mouth every 6 (six) hours as needed.   albuterol  108 (90 Base) MCG/ACT inhaler Commonly known as: VENTOLIN  HFA Inhale 1-2 puffs into the lungs every 6 (six) hours as needed for wheezing or shortness of breath.   amiodarone  200 MG tablet Commonly known as: PACERONE  Take 1 tablet (200 mg total) by mouth daily.   aspirin  81 MG tablet Take 1 tablet (81 mg total) by mouth daily.   Eliquis  5 MG Tabs tablet Generic drug: apixaban  Take 1 tablet (5 mg total) by mouth 2 (two) times daily.   fluticasone  50 MCG/ACT nasal spray Commonly known as: FLONASE  Place 1 spray into both nostrils as needed for allergies or rhinitis.   spironolactone  25 MG tablet Commonly known as: ALDACTONE  Take 1 tablet (25 mg total) by mouth daily.   torsemide  20 MG tablet Commonly known as: DEMADEX  Take 1 tablet (20 mg total) by mouth every Monday, Wednesday, and Friday.        BP (!) 165/96   Pulse (!) 102   Resp 18   Ht 5' 1 (1.549 m)   Wt 225 lb (102.1 kg)   LMP 10/10/1996   SpO2 98%   BMI 42.51 kg/m   Physical Exam: General -  Resting comfortably in chair CV - RRR; sternotomy incision healing well. No drainage Resp - Unlabored on RA; fair amount of tenderness on right lateral/anterior chest wall but no erythema, skin changes. Abd - Soft, ND/ND Ext - Moderate edema bilaterally    Imaging: CXR - Clear lung fields, stable right pleural effusion   Assessment/Plan: Ms. Manchester is a 68 year old woman s/p Bio AVR/MVR and LAAL on 11/17.  Post-op course complicated by Aflutter, converted to CHB and now with PPM.  She is recovering well at home.  She continues to have bad right chest wall pain without any concerning findings on exam.  IT seems musculoskeletal as it is reproducible with palpation.  Will start ibuprofen  along with tylenol  as well as lidocaine  patches, PRN robaxin  and hot/ice packs.  Will make referral to cardiac rehab and encouraged her to take extra torsemide  each week to help with edema, although she is reluctant because she has to use the bathroom very frequently when she does.  She will get a BMP today and see HF cardiology on  12/26.  She will return to see me on 10/24/24.  Con GORMAN Clunes, MD 2:38 PM 09/30/2024  "

## 2024-09-30 NOTE — Patient Instructions (Addendum)
 Take 400 - 600 mg ibuprofen  every 12 hrs, continue to take tylenol   Example: 8 am - ibuprofen  2 pm - tylenol  8 pm- ibuprofen  Midnight - tylenol   Use lidocaine  patch as needed.   Also use ice packs or hot pack Take robaxin  (muscle relaxant) as needed - ok to take at the same time as ibuprofen  or tylenol  OK to get a pedicure

## 2024-10-01 ENCOUNTER — Telehealth (HOSPITAL_COMMUNITY): Payer: Self-pay

## 2024-10-01 ENCOUNTER — Encounter (HOSPITAL_COMMUNITY): Payer: Self-pay

## 2024-10-01 LAB — BASIC METABOLIC PANEL WITH GFR
BUN/Creatinine Ratio: 12 (ref 12–28)
BUN: 12 mg/dL (ref 8–27)
CO2: 22 mmol/L (ref 20–29)
Calcium: 8.8 mg/dL (ref 8.7–10.3)
Chloride: 103 mmol/L (ref 96–106)
Creatinine, Ser: 1.01 mg/dL — ABNORMAL HIGH (ref 0.57–1.00)
Glucose: 81 mg/dL (ref 70–99)
Potassium: 3.8 mmol/L (ref 3.5–5.2)
Sodium: 142 mmol/L (ref 134–144)
eGFR: 61 mL/min/1.73

## 2024-10-01 NOTE — Telephone Encounter (Signed)
 Attempted to call patient in regards to Cardiac Rehab - LM on VM Mailed letter

## 2024-10-02 ENCOUNTER — Telehealth (HOSPITAL_COMMUNITY): Payer: Self-pay

## 2024-10-02 NOTE — Telephone Encounter (Signed)
 Patient returned phone call and said that she was interested in Cardiac rehab. She expressed interest in having Rehab in [facility] because that is where she lives. Faxed referral to [facility] and closed referral

## 2024-10-04 ENCOUNTER — Encounter (HOSPITAL_COMMUNITY): Payer: Self-pay

## 2024-10-04 ENCOUNTER — Ambulatory Visit (HOSPITAL_COMMUNITY)
Admission: RE | Admit: 2024-10-04 | Discharge: 2024-10-04 | Disposition: A | Discharge status: Home / Self Care | Source: Ambulatory Visit | Attending: Internal Medicine | Admitting: Internal Medicine

## 2024-10-04 ENCOUNTER — Ambulatory Visit: Payer: Self-pay | Admitting: Student in an Organized Health Care Education/Training Program

## 2024-10-04 VITALS — BP 146/92 | HR 97 | Ht 61.0 in | Wt 225.2 lb

## 2024-10-04 DIAGNOSIS — Z8249 Family history of ischemic heart disease and other diseases of the circulatory system: Secondary | ICD-10-CM | POA: Insufficient documentation

## 2024-10-04 DIAGNOSIS — Z79899 Other long term (current) drug therapy: Secondary | ICD-10-CM | POA: Diagnosis not present

## 2024-10-04 DIAGNOSIS — E785 Hyperlipidemia, unspecified: Secondary | ICD-10-CM | POA: Diagnosis not present

## 2024-10-04 DIAGNOSIS — Z9221 Personal history of antineoplastic chemotherapy: Secondary | ICD-10-CM | POA: Insufficient documentation

## 2024-10-04 DIAGNOSIS — Z9011 Acquired absence of right breast and nipple: Secondary | ICD-10-CM | POA: Diagnosis not present

## 2024-10-04 DIAGNOSIS — I442 Atrioventricular block, complete: Secondary | ICD-10-CM | POA: Diagnosis not present

## 2024-10-04 DIAGNOSIS — R9431 Abnormal electrocardiogram [ECG] [EKG]: Secondary | ICD-10-CM | POA: Insufficient documentation

## 2024-10-04 DIAGNOSIS — Z7901 Long term (current) use of anticoagulants: Secondary | ICD-10-CM | POA: Insufficient documentation

## 2024-10-04 DIAGNOSIS — Z923 Personal history of irradiation: Secondary | ICD-10-CM | POA: Insufficient documentation

## 2024-10-04 DIAGNOSIS — I080 Rheumatic disorders of both mitral and aortic valves: Secondary | ICD-10-CM | POA: Insufficient documentation

## 2024-10-04 DIAGNOSIS — F17210 Nicotine dependence, cigarettes, uncomplicated: Secondary | ICD-10-CM | POA: Diagnosis not present

## 2024-10-04 DIAGNOSIS — I4892 Unspecified atrial flutter: Secondary | ICD-10-CM | POA: Diagnosis not present

## 2024-10-04 DIAGNOSIS — Z853 Personal history of malignant neoplasm of breast: Secondary | ICD-10-CM | POA: Insufficient documentation

## 2024-10-04 DIAGNOSIS — I061 Rheumatic aortic insufficiency: Secondary | ICD-10-CM

## 2024-10-04 DIAGNOSIS — Z950 Presence of cardiac pacemaker: Secondary | ICD-10-CM | POA: Insufficient documentation

## 2024-10-04 DIAGNOSIS — Z952 Presence of prosthetic heart valve: Secondary | ICD-10-CM | POA: Diagnosis not present

## 2024-10-04 MED ORDER — TORSEMIDE 20 MG PO TABS: ORAL_TABLET | ORAL | 3 refills | Status: AC

## 2024-10-04 NOTE — Progress Notes (Addendum)
 "     Advanced Heart Failure Rounding Note  HF Cardiologist: Dr. Rolan PCP: Daryl Setter, NP   HPI: Katie Jacobson is a 68 y.o. 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    She underwent planned AVR with 21 mm Inspiris valve, MVR with 27 mm Mitris valve and LAA ligation by Dr. Daniel on 08/26/24. TEE post-bypass with normal functioning prosthetic valves and moderately reduced LV. Became hypotensive after chest was closed requiring escalation in pressors/inotropes. She had trouble with hypoxia, volume overload and difficulty weaning from inotrope support post-op. She was diuresed and later weaned from inotropes. Coarse further c/b post-op atrial flutter and ileus. Started on amiodarone  gtt and underwent DCCV to SR, but developed CHB. Seen by EP and underwent PPM implant and DCCV on 12/03.  Today she returns for post hospital follow up. Overall feeling pretty good. Denies palpitations, CP, dizziness,  or PND/Orthopnea. Swelling in her legs. SOB with exertion. Appetite ok, watching what she eats. No fever or chills. Just got a scale today, will start eating daily. Taking all medications. Denies tobacco or drug use. Has a mixed drink or 2 every now and then but not regularly. Drinking <64 oz/day.   Device interrogation today with <0.1% in a fib. Active 2.5 hr/day. VP 99.9%.   ROS: All systems negative except as listed in HPI, PMH and Problem List.  SH:  Social History   Socioeconomic History   Marital status: Married    Spouse name: Not on file   Number of children: 1   Years of education: Not on file   Highest education level: Not on file  Occupational History   Not on file  Tobacco Use   Smoking status: Some Days    Current packs/day: 0.50    Average packs/day: 0.5 packs/day for 43.0 years (21.5 ttl pk-yrs)    Types:  Cigarettes   Smokeless tobacco: Never   Tobacco comments:    08/22/24 - one pack of cigarettes every couple of days  Vaping Use   Vaping status: Never Used  Substance and Sexual Activity   Alcohol use: Not Currently    Comment: occ    Drug use: No   Sexual activity: Yes    Partners: Male  Other Topics Concern   Not on file  Social History Narrative   Married with 2 daughters- born 41 and 55 ([name] died in MVA 11/07/15)   Previously worked -- therapist, 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  Has been doing part time work with [employer] -- had 6 active clients, but the most important one is her Daughter.   Regular exercise:  No   Caffeine Use: none; Current 1ppd smoker x > 39 yrs; EtOH ~4 oz/week   Social Drivers of Health   Tobacco Use: High Risk (09/19/2024)   Patient History    Smoking Tobacco Use: Some Days    Smokeless Tobacco Use: Never    Passive Exposure: Not on file  Financial Resource Strain: Not on file  Food Insecurity: No Food Insecurity (09/13/2024)   Epic    Worried About Programme Researcher, Broadcasting/film/video in the Last Year: Never true    Ran Out of Food in the Last Year: Never true  Transportation Needs: No Transportation Needs (09/13/2024)   Epic    Lack of  Transportation (Medical): No    Lack of Transportation (Non-Medical): No  Physical Activity: Not on file  Stress: Not on file  Social Connections: Unknown (08/28/2024)   Social Connection and Isolation Panel    Frequency of Communication with Friends and Family: Never    Frequency of Social Gatherings with Friends and Family: Never    Attends Religious Services: Never    Database Administrator or Organizations: Yes    Attends Banker Meetings: 1 to 4 times per year    Marital Status: Patient declined  Intimate Partner Violence: Not At Risk (09/13/2024)   Epic    Fear of Current or Ex-Partner: No    Emotionally Abused: No    Physically Abused: No    Sexually Abused: No  Depression  (PHQ2-9): Low Risk (12/27/2023)   Depression (PHQ2-9)    PHQ-2 Score: 2  Alcohol Screen: Not on file  Housing: Unknown (09/13/2024)   Epic    Unable to Pay for Housing in the Last Year: No    Number of Times Moved in the Last Year: Not on file    Homeless in the Last Year: No  Utilities: Not At Risk (09/13/2024)   Epic    Threatened with loss of utilities: No  Health Literacy: Not on file    FH:  Family History  Problem Relation Age of Onset   Heart disease Mother    Stroke Mother    Hypertension Mother    Diabetes Mother    Heart attack Father    Diabetes Sister    Diabetes Sister    Kidney disease Brother    Mental retardation Brother    Colon polyps Daughter    Cancer Cousin    Breast cancer Cousin        deceased 57   Colon cancer Neg Hx    Esophageal cancer Neg Hx    Rectal cancer Neg Hx    Stomach cancer Neg Hx     Past Medical History:  Diagnosis Date   Allergy    Arthritis    Breast cancer of upper-outer quadrant of right female breast (HCC) 11/14/2014   Treated with chemotherapy and radiation (diagnosed again in March 2024 in both breasts)   Heart murmur    History of chemotherapy    finished chemo 02/03/2015   History of kidney stones    History of seizures    as a child - unknown cause - states was never on anticonvulsants   Hypertension    states under control with meds., has been on med. x years   Mitral regurgitation    Mitral valve prolapse 1990   Rheumatic mitral valve with moderate to severe MR by echo 05/31/2024 => TEE suggests rheumatic mitral valve with severe calcified leaflets and severe mitral regurgitation but also severe aortic regurgitation   Osteoporosis    Personal history of chemotherapy    Personal history of radiation therapy    Port-A-Cath in place 01/24/2023   Seasonal allergies     Current Outpatient Medications  Medication Sig Dispense Refill   acetaminophen  (TYLENOL ) 325 MG tablet Take 2 tablets (650 mg total) by mouth  every 6 (six) hours as needed.     albuterol  (VENTOLIN  HFA) 108 (90 Base) MCG/ACT inhaler Inhale 1-2 puffs into the lungs every 6 (six) hours as needed for wheezing or shortness of breath. 1 each 0   amiodarone  (PACERONE ) 200 MG tablet Take 1 tablet (200 mg total) by mouth daily. 30 tablet  2   apixaban  (ELIQUIS ) 5 MG TABS tablet Take 1 tablet (5 mg total) by mouth 2 (two) times daily. 60 tablet 5   aspirin  81 MG tablet Take 1 tablet (81 mg total) by mouth daily. 30 tablet 0   fluticasone  (FLONASE ) 50 MCG/ACT nasal spray Place 1 spray into both nostrils as needed for allergies or rhinitis.     lidocaine  (LIDODERM ) 5 % Place 1 patch onto the skin daily. Remove & Discard patch within 12 hours or as directed by MD 10 patch 0   spironolactone  (ALDACTONE ) 25 MG tablet Take 1 tablet (25 mg total) by mouth daily. 30 tablet 2   torsemide  (DEMADEX ) 20 MG tablet Take 1 tablet (20 mg total) by mouth every Monday, Wednesday, and Friday. (Patient taking differently: Take 20 mg by mouth every Monday, Wednesday, and Friday. And Saturday) 30 tablet 1   No current facility-administered medications for this encounter.    Vitals:   10/04/24 1341  BP: (!) 146/92  Pulse: 97  SpO2: 96%  Weight: 102.2 kg (225 lb 3.2 oz)  Height: 5' 1 (1.549 m)    PHYSICAL EXAM: General:  well appearing.  No respiratory difficulty. Walked into clinic.  Neck: JVD ~6/7 cm.  Cor: Regular rate & rhythm. No murmurs. Lungs: clear Extremities: no edema  Neuro: alert & oriented x 3. Affect pleasant.   Wt Readings from Last 3 Encounters:  10/04/24 102.2 kg (225 lb 3.2 oz)  09/30/24 102.1 kg (225 lb)  09/19/24 101.2 kg (223 lb 1.6 oz)    ECG: AV paced 93 bpm (Personally reviewed)    ASSESSMENT & PLAN: 1. Rheumatic heart disease: Severe MR and severe AI on 9/25 TEE with EF 55-60%. S/p AVR with 21 mm Inspiris valve, MVR with 27 mm Mitris valve and LAA ligation by Dr. Daniel on 08/26/24. Post-op echo (11/20) showed LV EF 65%, mild RV  dilation with normal systolic function but there is likely severe tricuspid regurgitation, s/p bioprosthetic MV replacement with mean gradient 4, s/p bioprosthetic AV replacement with mean gradient 20.   - Echo 11/29 EF 60% RV ok. Mean AVG 14. Mild TR - Followed by Dr. Daniel.   2. Post-op cardiogenic shock/RV failure:  Echo post-op on 11/20 showed LV EF 65%, mild RV dilation with normal systolic function but there is likely severe tricuspid regurgitation, s/p bioprosthetic MV replacement with mean gradient 4, s/p bioprosthetic AV replacement with mean gradient 20.  - Continue spiro 25 mg daily - Continue torsemide  20 MWFS (this is the first week she will increase to include Saturday.) - BMET/BNP in 1 week.   3. Atrial flutter: Atypical, noted post-op. - s/p DC-CV 11/28. -> sinus with CHB - Amio stopped 11/30 with CHB - s/p PPM and DC-CV on 12/3 - AV paced now - continue amio 200 daily and Eliquis  5 mg BID  4. CHB - s/p PPM and DC-CV on 12/3 - AV paced  - continue amio 200 daily and Eliquis  - Followed by EP  Referral in for cardiac rehab.    Follow up in 2 months.   Beckey LITTIE Coe AGACNP-BC  "

## 2024-10-04 NOTE — Addendum Note (Signed)
 Encounter addended by: Hayes Beckey CROME, NP on: 10/04/2024 4:13 PM  Actions taken: Clinical Note Signed

## 2024-10-04 NOTE — Patient Instructions (Signed)
 Medication Changes:  CHANGE TORSEMIDE  TO MONDAY, WEDNESDAY, FRIDAY, AND SATURDAY  Lab Work:  LABS IN 1 WEEK AS SCHEDULED   Follow-Up in: 2 MONTHS AS SCHEDULED   At the [facility], you and your health needs are our priority. We have a designated team specialized in the treatment of Heart Failure. This Care Team includes your primary Heart Failure Specialized Cardiologist (physician), Advanced Practice Providers (APPs- Physician Assistants and Nurse Practitioners), and Pharmacist who all work together to provide you with the care you need, when you need it.   You may see any of the following providers on your designated Care Team at your next follow up:  Dr. Toribio Fuel Dr. Ezra Shuck Dr. Odis Brownie Greig Mosses, NP Caffie Shed, [place] [facility] [place], [place] Beckey Coe, NP Jordan Lee, NP Tinnie Redman, PharmD   Please be sure to bring in all your medications bottles to every appointment.   Need to Contact Us :  If you have any questions or concerns before your next appointment please send us  a message through [place] or call our office at [phone].    TO LEAVE A MESSAGE FOR THE NURSE SELECT OPTION 2, PLEASE LEAVE A MESSAGE INCLUDING: YOUR NAME DATE OF BIRTH CALL BACK NUMBER REASON FOR CALL**this is important as we prioritize the call backs  YOU WILL RECEIVE A CALL BACK THE SAME DAY AS LONG AS YOU CALL BEFORE 4:00 PM

## 2024-10-08 ENCOUNTER — Other Ambulatory Visit: Payer: Self-pay

## 2024-10-08 MED ORDER — AMIODARONE HCL 200 MG PO TABS: 200.0000 mg | ORAL_TABLET | Freq: Every day | ORAL | 2 refills | Status: AC

## 2024-10-08 MED ORDER — APIXABAN 5 MG PO TABS: 5.0000 mg | ORAL_TABLET | Freq: Two times a day (BID) | ORAL | 5 refills | Status: AC

## 2024-10-08 MED ORDER — SPIRONOLACTONE 25 MG PO TABS: 25.0000 mg | ORAL_TABLET | Freq: Every day | ORAL | 2 refills | Status: AC

## 2024-10-08 NOTE — Progress Notes (Signed)
 Patient contacted the office with concerns about medication changes and running out of certain medicines. She is s/p AVR with Dr. Daniel on 08/26/2024. She was last seen by Cardiology 12/26. Patient states that she has had trouble getting her medications transferred to a local pharmacy for ease of pick up and is requesting medication sent to new requested pharmacy. Advised that patient's medications would come through her Cardiology office as they will maintain her medications but I would send in medication today so she would not run out. Spironolactone , Amiodarone , and Eliquis  were sent into patient's preferred pharmacy [facility] on [address] in [place]. Also, advised that patient would need to go over medications with Cardiology to ensure she was taking medications as prescribed. She acknowledged receipt.

## 2024-10-11 ENCOUNTER — Ambulatory Visit (HOSPITAL_COMMUNITY)
Admission: RE | Admit: 2024-10-11 | Discharge: 2024-10-11 | Disposition: A | Discharge status: Home / Self Care | Source: Ambulatory Visit | Attending: Cardiology | Admitting: Cardiology

## 2024-10-11 DIAGNOSIS — I4892 Unspecified atrial flutter: Secondary | ICD-10-CM | POA: Diagnosis not present

## 2024-10-18 ENCOUNTER — Encounter: Payer: Self-pay | Admitting: Cardiology

## 2024-10-20 ENCOUNTER — Other Ambulatory Visit: Payer: Self-pay | Admitting: Family

## 2024-10-20 DIAGNOSIS — I10 Essential (primary) hypertension: Secondary | ICD-10-CM

## 2024-10-20 NOTE — Telephone Encounter (Signed)
 I received a refill request for enalapril  and metoprolol . My records show that these meds have been discontinued.  Has she still been taking?

## 2024-10-21 ENCOUNTER — Telehealth (HOSPITAL_COMMUNITY): Payer: Self-pay

## 2024-10-21 NOTE — Telephone Encounter (Signed)
 Patient confirmed cardiac rehab orientation at [facility] on 11/12/24 at 10:00am. Patient's physical therapy will stop when patient starts cardiac rehab.

## 2024-10-21 NOTE — Telephone Encounter (Signed)
 Patient called and stated that she had not heard from [facility] Cardiac rehab to schedule and wanted phone #.  Provided # [phone] number to patient

## 2024-10-21 NOTE — Telephone Encounter (Signed)
 Patient called back and stated that she could not reach anyone by that number I provided.  Called [facility] and They advised that patient had already been schedule for Cardiac Rehab on 11/12/2024 @ 10:00am.  Called patient hacve and provided # [phone] and advised her that they would have Alm call her and go over appointment.

## 2024-10-23 ENCOUNTER — Ambulatory Visit

## 2024-10-23 ENCOUNTER — Other Ambulatory Visit: Payer: Self-pay

## 2024-10-23 DIAGNOSIS — I050 Rheumatic mitral stenosis: Secondary | ICD-10-CM

## 2024-10-23 DIAGNOSIS — I442 Atrioventricular block, complete: Secondary | ICD-10-CM | POA: Diagnosis not present

## 2024-10-24 ENCOUNTER — Ambulatory Visit: Payer: Self-pay

## 2024-10-24 ENCOUNTER — Ambulatory Visit (HOSPITAL_COMMUNITY)

## 2024-10-24 VITALS — BP 169/69 | HR 103 | Resp 20 | Ht 61.0 in | Wt 220.0 lb

## 2024-10-24 DIAGNOSIS — I050 Rheumatic mitral stenosis: Secondary | ICD-10-CM

## 2024-10-24 DIAGNOSIS — Z952 Presence of prosthetic heart valve: Secondary | ICD-10-CM

## 2024-10-24 DIAGNOSIS — I351 Nonrheumatic aortic (valve) insufficiency: Secondary | ICD-10-CM

## 2024-10-24 DIAGNOSIS — Z9889 Other specified postprocedural states: Secondary | ICD-10-CM

## 2024-10-24 LAB — CUP PACEART REMOTE DEVICE CHECK
Battery Remaining Longevity: 132 mo
Battery Voltage: 3.2 V
Brady Statistic AP VP Percent: 3.67 %
Brady Statistic AP VS Percent: 0.02 %
Brady Statistic AS VP Percent: 95.76 %
Brady Statistic AS VS Percent: 0.55 %
Brady Statistic RA Percent Paced: 3.63 %
Brady Statistic RV Percent Paced: 99.42 %
Date Time Interrogation Session: 20260113233921
Implantable Lead Connection Status: 753985
Implantable Lead Connection Status: 753985
Implantable Lead Implant Date: 20251203
Implantable Lead Implant Date: 20251203
Implantable Lead Location: 753859
Implantable Lead Location: 753860
Implantable Lead Model: 3830
Implantable Lead Model: 5076
Implantable Pulse Generator Implant Date: 20251203
Lead Channel Impedance Value: 285 Ohm
Lead Channel Impedance Value: 399 Ohm
Lead Channel Impedance Value: 399 Ohm
Lead Channel Impedance Value: 551 Ohm
Lead Channel Pacing Threshold Amplitude: 0.625 V
Lead Channel Pacing Threshold Amplitude: 0.625 V
Lead Channel Pacing Threshold Pulse Width: 0.4 ms
Lead Channel Pacing Threshold Pulse Width: 0.4 ms
Lead Channel Sensing Intrinsic Amplitude: 17.5 mV
Lead Channel Sensing Intrinsic Amplitude: 17.5 mV
Lead Channel Sensing Intrinsic Amplitude: 2.5 mV
Lead Channel Sensing Intrinsic Amplitude: 2.5 mV
Lead Channel Setting Pacing Amplitude: 3.5 V
Lead Channel Setting Pacing Amplitude: 3.5 V
Lead Channel Setting Pacing Pulse Width: 0.4 ms
Lead Channel Setting Sensing Sensitivity: 0.9 mV
Zone Setting Status: 755011
Zone Setting Status: 755011

## 2024-10-24 NOTE — Progress Notes (Signed)
" ° °   °  [address] [place] [address]             [phone]       HPI:  Patient returns for routine postoperative follow-up having undergone bio AVR (21 mm Inspiris)/MVR (27 mm mitris), LAAL on 08/26/24.  She was discharged on POD 17, she is now about 2 months post-op.  Wt today is 220 lbs from 225 at last post op visit.  Her right chest wall pain is a little bit better on a regimen of ibuprofen  and tylenol .  She is also now taking Torsemide  on Saturday in addition to MWF and her edema has gotten better.  Allergies as of 10/24/2024       Reactions   Sulfa Antibiotics Other (See Comments)   UNKNOWN        Medication List        Accurate as of October 24, 2024 12:38 PM. If you have any questions, ask your nurse or doctor.          acetaminophen  325 MG tablet Commonly known as: TYLENOL  Take 2 tablets (650 mg total) by mouth every 6 (six) hours as needed.   albuterol  108 (90 Base) MCG/ACT inhaler Commonly known as: VENTOLIN  HFA Inhale 1-2 puffs into the lungs every 6 (six) hours as needed for wheezing or shortness of breath.   amiodarone  200 MG tablet Commonly known as: PACERONE  Take 1 tablet (200 mg total) by mouth daily.   apixaban  5 MG Tabs tablet Commonly known as: ELIQUIS  Take 1 tablet (5 mg total) by mouth 2 (two) times daily.   aspirin  81 MG tablet Take 1 tablet (81 mg total) by mouth daily.   fluticasone  50 MCG/ACT nasal spray Commonly known as: FLONASE  Place 1 spray into both nostrils as needed for allergies or rhinitis.   lidocaine  5 % Commonly known as: Lidoderm  Place 1 patch onto the skin daily. Remove & Discard patch within 12 hours or as directed by MD   spironolactone  25 MG tablet Commonly known as: ALDACTONE  Take 1 tablet (25 mg total) by mouth daily.   torsemide  20 MG tablet Commonly known as: DEMADEX  Take 1 tablet 20mg   every Monday, Wednesday, Friday, and Saturday        BP (!) 169/69   Pulse (!) 103    Resp 20   Ht 5' 1 (1.549 m)   Wt 220 lb (99.8 kg)   LMP 10/10/1996   SpO2 98% Comment: RA  BMI 41.57 kg/m   Physical Exam: General - Resting comfortably in chair CV - RRR; sternotomy incision healing well. No drainage Resp - Unlabored on RA Abd - Soft, ND/ND Ext - Mild edema bilaterally    Imaging: No new imaging   Assessment/Plan: Katie Jacobson is a 69 year old woman s/p Bio AVR/MVR and LAAL on 11/17.  Post-op course complicated by Aflutter, converted to CHB and now with PPM.  She is recovering well at home.  Her right chest wall pain is somewhat better with ibuprofen  and tylenol .  Her leg edema has improved with increased frequency of torsemide .  I advised her to continue her current torsemide  regimen.  She will start cardiac rehab on Feb 3 and I will see her back in 3 months for follow-up.  Con GORMAN Clunes, MD 12:38 PM 10/24/24  "

## 2024-10-27 ENCOUNTER — Ambulatory Visit: Payer: Self-pay | Admitting: Student in an Organized Health Care Education/Training Program

## 2024-10-30 NOTE — Progress Notes (Signed)
 Remote PPM Transmission

## 2024-11-06 NOTE — Telephone Encounter (Signed)
 Created in error

## 2024-11-08 ENCOUNTER — Ambulatory Visit: Admitting: Family

## 2024-11-14 NOTE — Telephone Encounter (Signed)
 Called patient twice but no answer, lvm for patient to call back

## 2024-11-14 NOTE — Telephone Encounter (Signed)
 Per patient she is not on this medications.

## 2024-11-21 ENCOUNTER — Encounter

## 2024-12-06 ENCOUNTER — Ambulatory Visit (HOSPITAL_COMMUNITY)

## 2024-12-06 ENCOUNTER — Ambulatory Visit: Admitting: Family

## 2024-12-11 ENCOUNTER — Ambulatory Visit: Admitting: Pulmonary Disease

## 2025-01-22 ENCOUNTER — Ambulatory Visit

## 2025-01-23 ENCOUNTER — Ambulatory Visit

## 2025-04-23 ENCOUNTER — Ambulatory Visit

## 2025-07-23 ENCOUNTER — Ambulatory Visit
